# Patient Record
Sex: Female | Born: 1945 | Race: Black or African American | Hispanic: No | Smoking: Former smoker
Health system: Southern US, Community
[De-identification: ages and names within clinical notes are randomized; demographics above are authoritative.]

## PROBLEM LIST (undated history)

## (undated) DIAGNOSIS — Z972 Presence of dental prosthetic device (complete) (partial): Secondary | ICD-10-CM

## (undated) DIAGNOSIS — M8580 Other specified disorders of bone density and structure, unspecified site: Secondary | ICD-10-CM

## (undated) DIAGNOSIS — E669 Obesity, unspecified: Secondary | ICD-10-CM

## (undated) DIAGNOSIS — K2970 Gastritis, unspecified, without bleeding: Secondary | ICD-10-CM

## (undated) DIAGNOSIS — R200 Anesthesia of skin: Secondary | ICD-10-CM

## (undated) DIAGNOSIS — M1710 Unilateral primary osteoarthritis, unspecified knee: Secondary | ICD-10-CM

## (undated) DIAGNOSIS — K5732 Diverticulitis of large intestine without perforation or abscess without bleeding: Secondary | ICD-10-CM

## (undated) DIAGNOSIS — L608 Other nail disorders: Secondary | ICD-10-CM

## (undated) DIAGNOSIS — H409 Unspecified glaucoma: Secondary | ICD-10-CM

## (undated) DIAGNOSIS — K219 Gastro-esophageal reflux disease without esophagitis: Secondary | ICD-10-CM

## (undated) DIAGNOSIS — H269 Unspecified cataract: Secondary | ICD-10-CM

## (undated) DIAGNOSIS — D510 Vitamin B12 deficiency anemia due to intrinsic factor deficiency: Secondary | ICD-10-CM

## (undated) DIAGNOSIS — E785 Hyperlipidemia, unspecified: Secondary | ICD-10-CM

## (undated) DIAGNOSIS — I10 Essential (primary) hypertension: Secondary | ICD-10-CM

## (undated) DIAGNOSIS — E119 Type 2 diabetes mellitus without complications: Secondary | ICD-10-CM

## (undated) DIAGNOSIS — K838 Other specified diseases of biliary tract: Secondary | ICD-10-CM

## (undated) DIAGNOSIS — Z973 Presence of spectacles and contact lenses: Secondary | ICD-10-CM

## (undated) DIAGNOSIS — E040 Nontoxic diffuse goiter: Secondary | ICD-10-CM

## (undated) DIAGNOSIS — E559 Vitamin D deficiency, unspecified: Secondary | ICD-10-CM

## (undated) DIAGNOSIS — Z5189 Encounter for other specified aftercare: Secondary | ICD-10-CM

## (undated) DIAGNOSIS — B9681 Helicobacter pylori [H. pylori] as the cause of diseases classified elsewhere: Secondary | ICD-10-CM

## (undated) DIAGNOSIS — F419 Anxiety disorder, unspecified: Secondary | ICD-10-CM

## (undated) DIAGNOSIS — IMO0002 Reserved for concepts with insufficient information to code with codable children: Secondary | ICD-10-CM

## (undated) HISTORY — DX: Unspecified glaucoma: H40.9

## (undated) HISTORY — DX: Gastro-esophageal reflux disease without esophagitis: K21.9

## (undated) HISTORY — PX: CATARACT EXTRACTION: SUR2

## (undated) HISTORY — DX: Nontoxic diffuse goiter: E04.0

## (undated) HISTORY — DX: Unspecified cataract: H26.9

## (undated) HISTORY — DX: Obesity, unspecified: E66.9

## (undated) HISTORY — DX: Anxiety disorder, unspecified: F41.9

## (undated) HISTORY — DX: Vitamin B12 deficiency anemia due to intrinsic factor deficiency: D51.0

## (undated) HISTORY — DX: Reserved for concepts with insufficient information to code with codable children: IMO0002

## (undated) HISTORY — PX: MULTIPLE TOOTH EXTRACTIONS: SHX2053

## (undated) HISTORY — PX: DILATION AND CURETTAGE OF UTERUS: SHX78

## (undated) HISTORY — DX: Essential (primary) hypertension: I10

## (undated) HISTORY — DX: Diverticulitis of large intestine without perforation or abscess without bleeding: K57.32

## (undated) HISTORY — PX: JOINT REPLACEMENT: SHX530

## (undated) HISTORY — DX: Other specified disorders of bone density and structure, unspecified site: M85.80

## (undated) HISTORY — DX: Helicobacter pylori (H. pylori) as the cause of diseases classified elsewhere: B96.81

## (undated) HISTORY — DX: Other nail disorders: L60.8

## (undated) HISTORY — DX: Vitamin D deficiency, unspecified: E55.9

## (undated) HISTORY — DX: Hyperlipidemia, unspecified: E78.5

## (undated) HISTORY — DX: Unilateral primary osteoarthritis, unspecified knee: M17.10

## (undated) HISTORY — DX: Encounter for other specified aftercare: Z51.89

## (undated) HISTORY — DX: Helicobacter pylori (H. pylori) as the cause of diseases classified elsewhere: K29.70

## (undated) HISTORY — PX: BACK SURGERY: SHX140

## (undated) SURGERY — ENDOSCOPIC RETROGRADE CHOLANGIOPANCREATOGRAPHY (ERCP) WITH PROPOFOL
Anesthesia: Monitor Anesthesia Care

---

## 2007-01-13 HISTORY — PX: COLECTOMY: SHX59

## 2007-09-06 ENCOUNTER — Ambulatory Visit: Payer: Self-pay | Admitting: Infectious Diseases

## 2007-09-06 ENCOUNTER — Inpatient Hospital Stay (HOSPITAL_COMMUNITY): Admission: EM | Admit: 2007-09-06 | Discharge: 2007-09-23 | Payer: Self-pay | Admitting: Emergency Medicine

## 2007-09-10 ENCOUNTER — Ambulatory Visit: Payer: Self-pay | Admitting: Gastroenterology

## 2007-09-12 ENCOUNTER — Encounter (INDEPENDENT_AMBULATORY_CARE_PROVIDER_SITE_OTHER): Payer: Self-pay | Admitting: Surgery

## 2010-05-27 NOTE — Consult Note (Signed)
Alison [name], Alison [name]              ACCOUNT NO.:  [id number]   MEDICAL RECORD NO.:  [id number]          PATIENT TYPE:  INP   LOCATION:  3302                         FACILITY:  [facility]   PHYSICIAN:  Juanetta Gosling, MDDATE OF BIRTH:  [birth year]   DATE OF CONSULTATION:  09/08/2007  DATE OF DISCHARGE:                                 CONSULTATION   TIME OF CONSULTATION:  12:30 p.m.   REQUESTING PHYSICIAN:  Stann Mainland. Sampson Goon, MD.   CONSULTING SURGEON:  Troy Sine. Dwain Sarna, MD.   REASON FOR CONSULTATION:  Lower gastrointestinal bleed.   HISTORY OF PRESENT ILLNESS:  Alison [name] is a 65 year old very pleasant  white female with a history of arthritis, hypertension, and  hyperlipidemia who began having hematochezia this past Tuesday.  At this  time, she says that she was not having any abdominal pain or any other  symptoms that would indicate why she may be having this going on.  At  that time because this scared her, she presented to the emergency  department.  At that time, she was admitted and Dr. Elnoria Howard was consulted  for a colonoscopy and EGD.  This was done and the colonoscopy showed  scattered medium-to-large sized diverticula noted throughout the colon.  There was no active bleeding site noted; however, there was a  significant amount of blood throughout the entire colon.  An EGD was  also done, which was essentially negative.  Since then, the patient has  had several units of blood transfused due to her continuing  hematochezia.  The patient also had a nuclear bleeding scan, which  questioned possible bleed in the ascending colon or near the hepatic  flexure.  Otherwise, the patient was then taken down to Interventional  Radiology today for a CT angio with possible embolization.  However,  when the patient was taken down, no bleeding was seen and therefore an  embolization could not be completed.  Once the patient got back from  this procedure though, she did have approximately  200 mL bloody bowel  movement that was mainly blood.  After that, she then had another  smaller bowel movement that mainly consisted of blood.  At this time,  her hemoglobin is currently being checked, however last check was 6.4;  however, she was transfused after that check.  At this time, we were  consulted for possible surgical intervention for this GI bleed.   REVIEW OF SYSTEMS:  Please see HPI, otherwise all other systems are  negative.  The patient does not complain of any chest pain, shortness of  breath, or any abdominal pain whatsoever.   PAST MEDICAL HISTORY:  Significant for:  1. Arthritis.  2. Hypertension.  3. Hypercholesterolemia.  4. Ibuprofen use.   PAST SURGICAL HISTORY:  None.   SOCIAL HISTORY:  The patient has 1 daughter.  She is currently single  and works at a [employer].  She currently smokes  approximately half a pack of cigarettes a day for the past 40 years;  however, she does not drink any alcohol.   ALLERGIES:  NKDA.   MEDICATIONS:  1.  Zocor 40 mg.  2. Norvasc 10 mg.  3. Ibuprofen 600 mg b.i.d.   PHYSICAL EXAMINATION:  GENERAL:  This is a very pleasant, well-  developed, well-nourished 65 year old black female who is lying in bed,  currently in no acute distress.  VITAL SIGNS:  Temperature 98.3, pulse 90, blood pressure approximately  an hour ago was 105/57, however, approximately 10 minutes ago was  128/53; respirations are 16 with 96% saturation on room air.  HEENT:  Eyes, sclerae noninjected.  Pupils are equal, round, and  reactive to light.  Ears, nose, mouth, and throat; ears and nose with no  obvious masses or lesions.  No rhinorrhea.  Mouth is pink and moist.  Throat shows no exudate.  NECK:  Supple.  Trachea is midline.  No thyromegaly.  HEART:  Regular rate and rhythm.  Normal S1 and S2.  No murmurs,  gallops, or rubs are noted.  +2 carotid, radial, and pedal pulses  bilaterally.  LUNGS:  Clear to auscultation  bilaterally.  No wheezes, rhonchi, or  rales noted.  Respiratory effort is unlabored.  CHEST:  Symmetrical.  ABDOMEN:  Soft, nontender, and nondistended with active bowel sounds.  RECTAL:  Deferred, however, there are bloody remnants from her  hematochezia around her anus.  MUSCULOSKELETAL:  All 4 extremities are symmetrical with no cyanosis,  clubbing, or edema.  SKIN:  Warm and dry with no obvious masses, lesions, or rashes.  NEUROLOGIC:  The patient's cranial nerves II through XII appeared to be  grossly intact.  PSYCH:  The patient is alert and oriented x3 with an appropriate affect.   LABORATORY DATA AND DIAGNOSTICS:  White blood cell count 12,800,  hemoglobin 6.4, hematocrit 18.6, and platelet count was 130,000.  Please  note this is from September 07, 2007.  CBC, H&H has not been done today,  however, this has been ordered, so H&H are currently pending.  Sodium  140, potassium 3.4, chloride 114, CO2 22, glucose 135, BUN 7, and  creatinine 0.85.   Colonoscopy, nuclear medicine scan, and CT angio with embolization; for  all these results, please see HPI as they were described there.   IMPRESSION:  1. Lower gastrointestinal bleed.  2. Acute blood loss anemia secondary to hematochezia, which is      secondary to lower gastrointestinal bleed.  3. Hypertension.  4. Hyperlipidemia.  5. Arthritis.   PLAN:  At this time, the patient has been to Interventional Radiology  where an attempted embolization was tried; however, the patient was not  bleeding at that time and therefore an embolization could not be done.  I have discussed this with Dr. Miles Costain and this is not something where  they typically go down and try to repeat this, as the patient may once  again not be bleeding.  Therefore, at this time, I will need to discuss  this case with Dr. Dwain Sarna.  However, if the patient continues to  bleed, she may need a subtotal colectomy since the bleeding cannot be  localized to a  specific area.  Otherwise, for the time being, I  recommend continuation of checking labs such as H&H, giving blood as  needed, and keeping the patient well hydrated and resuscitated.  I would  also continue to keep the patient  n.p.o. in case surgical intervention is needed urgently.  Otherwise, at  this time, we will continue to follow this patient and hopefully she  will stop bleeding; however, as stated earlier if she does not, the  patient may need surgical intervention at some point.      Letha Cape, PA      Juanetta Gosling, MD  Electronically Signed    KEO/MEDQ  D:  09/08/2007  T:  09/09/2007  Job:  [id number]   cc:   Mick Sell, MD  Jordan Hawks Elnoria Howard, MD

## 2010-05-27 NOTE — Op Note (Signed)
NAMELONDIN, ANTONE              ACCOUNT NO.:  [id number]   MEDICAL RECORD NO.:  [id number]          PATIENT TYPE:  INP   LOCATION:  2550                         FACILITY:  [facility]   PHYSICIAN:  Sandria Bales. Ezzard Standing, M.D.  DATE OF BIRTH:  07/10/1945   DATE OF PROCEDURE:  09/09/2007  DATE OF DISCHARGE:                               OPERATIVE REPORT   Date of Surgery ??   PREOPERATIVE DIAGNOSIS:  Lower gastrointestinal bleed.   POSTOPERATIVE DIAGNOSIS:  Lower gastrointestinal bleed.   PROCEDURES:  Subtotal colectomy with side-to-end ileo to colonic  anastomosis and rigid sigmoidoscopy.   SURGEON:  Sandria Bales. Ezzard Standing, MD.   FIRST ASSISTANT:  Adolph Pollack, M.D.   ESTIMATED BLOOD LOSS:  200 mL   DRAINS LEFT IN:  None.   ANESTHESIA:  General endotracheal and the patient was given 2 units of  blood intraoperatively I believe.   INDICATIONS FOR PROCEDURE:  Ms. Stargell is a 65 year old black female who  was admitted on September 06, 2007, with a lower GI bleed.  She was seen by  Dr. Jeani Hawking who did a colonoscopy on September 06, 2007.  He saw  scattered medium-to-large diverticula throughout the colon.   The patient underwent a tagged red blood cell study, it did show as  positive for bleeding from the right colon/hepatic flexure, but she is  persisting to bleed, now has approximately 15 units of blood ahs been  transfused.  She had a hypotensive episode today.  I discussed at length  with her daughter and the patient about proceeding with surgery for the  GI bleed.   The indication and potential complications of the surgery, which would  include a subtotal colectomy since we did not have a specific source of  bleeding and assumed to be either diverticulum or AVMs.   The risk included infection, leak, needing an ostomy, or continued  bleeding even after surgery.   OPERATIVE NOTE:  The patient placed in a lithotomy position, given a  general endotracheal anesthetic.  She had a  Foley catheter in place, NG  tube in place, given a gram of cefoxitin at the initiation of procedure.  She had prepped her abdomen with Techni-Care and we got into her  abdominal cavity.  A time out was held identifying the patient and the  procedure.   Abdominal exploration revealed the right and left lobe of the liver  unremarkable.  Gallbladder unremarkable.  Stomach unremarkable.  She had  blood throughout most of her colon, but we saw no blood in her small  bowel.  On inspection, we ran a small bowel, there was no tumor mass,  nodule, or lesion and proceeded with the subtotal colectomy.   Initially, I mobilized the colon, started through the sigmoid colon,  went along the left colon, the splenic flexure, got  into the lesser  sac, took down the gastrocolic ligament, tried to spare as much omentum  as I could.  She had some attachments to the gallbladder I took down, I  took down the hepatic flexureand right colon.  After mobilizing the  entire colon  and identifying both the right and left ureters, I then  divided the terminal ileum about 3 or 4 cm beyond the cecum with a 75-  GIA stapler.   I then took down the colon using almost entirely the LigaSure.  I tried  to stay close to the colon, not removing much into the mesentery or  behind into the retroperitoneum and took the right colon, transverse  colon, left colon, did mobilize in the splenic flexure, sigmoid colon;  identified the last tic at about 8-10 cm above the peritoneal  reflection.  I divided the colon at that spot and I did a hand sewn side  of ileostomy to end distal sigmoid/proximal rectum using interrupted 2-0  silk sutures.  The anastomosis was used in an inverting fashion and  used Gambee sutures anteriorly.  This had a thumb size anatomosis  opening at the end.  Dr. Abbey Chatters broke scrub and did a sigmoidoscopy,  scoped up  to about 10 cm, insufflated the rectum, I put the anastomosis  under water, there was no  bubbling or evidence of leak.   He then withdrew the scope.  We then irrigated her with 5 liters of  saline.  We placed the omentum, trying to place the omentum under the  incision.  I then closed the abdomen with 2 running double stranded PDS  sutures.  I put in some interrupted #1 PDS sutures.   I then irrigated the wound, closed the skin with staples.  The patient  tolerated the procedure well.  She was given 2 units of blood  intraoperatively.  We will check labs  on her in the  recovery room.  I  will put her in the ICU at least overnight.      Sandria Bales. Ezzard Standing, M.D.  Electronically Signed     DHN/MEDQ  D:  09/11/2007  T:  09/12/2007  Job:  [id number]   cc:   Mick Sell, MD  Jordan Hawks. Elnoria Howard, MD

## 2010-05-27 NOTE — Consult Note (Signed)
Alison [name], Alison [name]              ACCOUNT NO.:  [id number]   MEDICAL RECORD NO.:  [id number]          PATIENT TYPE:  INP   LOCATION:  3302                         FACILITY:  [facility]   PHYSICIAN:  Jordan Hawks. Elnoria Howard, MD    DATE OF BIRTH:  [birth year]   DATE OF CONSULTATION:  DATE OF DISCHARGE:                                 CONSULTATION   REASON FOR CONSULTATION:  Hematochezia.   HISTORY OF PRESENT ILLNESS:  This is a 65 year old female with a past  medical history of hypertension and arthritis.  She was admitted to the  hospital with complaints with acute hematochezia.  The patient states  that when she woke up this morning she felt a strange sensation and  subsequently had several episodes of hematochezia.  With the bleeding  episode, she presented to the emergency room for further evaluation.  She denies having any abdominal pain, nausea, or vomiting.  She does  report using NSAIDs on a routine basis where she takes 800 mg of  ibuprofen on a p.r.n. basis.  She believe she averages approximately 1  tablet per day.  Because of the significant amount of bleeding, a GI  consultation was requested.   PAST MEDICAL HISTORY:  As stated above.   PAST SURGICAL HISTORY:  As stated above.   FAMILY HISTORY:  Noncontributory.   ALLERGIES:  No known drug allergies.   MEDICATIONS AT HOME:  1. Zocor 40 mg p.o. q.h.s.  2. Norvasc 10 mg p.o. daily.  3. Ibuprofen 800 mg 1 p.o. t.i.d. p.r.n.   SOCIAL HISTORY:  Negative for alcohol or illicit drug use.  Positive for  tobacco half pack per day for the past 20 years.   REVIEW OF SYSTEMS:  As stated above in the history of present illness.  Otherwise, negative.   PHYSICAL EXAMINATION:  VITAL SIGNS:  Blood pressure, systolic ranges  from 74 to low 100s.  Pulse oximetry 98% on 2 liters oxygen and heart  rate is 79 to 86 beats per minute.  GENERAL:  The patient is in no acute distress, alert, and oriented.  HEENT:  Normocephalic and atraumatic.   Extraocular muscles are intact.  NECK:  Supple.  No lymphadenopathy.  LUNGS:  Clear to auscultation bilaterally.  CARDIOVASCULAR:  Regular rate and rhythm.  ABDOMEN:  Obese, soft, nontender, and nondistended.  Positive bowel  sounds.  EXTREMITIES:  No clubbing, cyanosis, or edema.   LABORATORY VALUES:  White blood cell count 12.4, hemoglobin initially at  11.4, subsequently decreased to 8.4, and platelets at 296.  Sodium 140,  potassium 3.4, chloride 108, CO2 23, BUN 16, creatinine 0.9, glucose is  126, and MCV is 86.3.   IMPRESSION:  1. Hematochezia.  2. Anemia secondary to the hematochezia.  3. Nonsteroidal anti-inflammatory drug use.   The patient mostly likely has a diverticular bleed, however, with NSAID  use, she could possibly be having a rapid upper GI bleed.  Further  evaluation endoscopically is required at this time.   PLAN:  1. To perform EGD and colonoscopy.  2. Transfuse at least 2 units of packed red blood cells.  3. Follow hemoglobin.   Further recommendations, pending findings on the EGD and colonoscopy.      Jordan Hawks Elnoria Howard, MD  Electronically Signed     PDH/MEDQ  D:  09/06/2007  T:  09/07/2007  Job:  [id number]

## 2010-05-30 NOTE — Discharge Summary (Signed)
NAMEELLIET, Alison [name]              ACCOUNT NO.:  [id number]   MEDICAL RECORD NO.:  [id number]          PATIENT TYPE:  INP   LOCATION:  5032                         FACILITY:  [facility]   PHYSICIAN:  Fransisco Hertz, M.D.  DATE OF BIRTH:  [birth year]   DATE OF ADMISSION:  09/06/2007  DATE OF DISCHARGE:  09/23/2007                               DISCHARGE SUMMARY   DISCHARGE DIAGNOSES:  1. Acute gastrointestinal bleeding.  2. Anemia secondary to acute gastrointestinal bleeding.  3. Status post subtotal colectomy for gastrointestinal bleeding.  4. Hypertension.   DISCHARGE MEDICATIONS:  1. Norvasc 5 mg 1 tablet daily.  2. Simvastatin 40 mg 1 tablet daily.  3. Avelox 400 mg 1 tablet by mouth for seven more days.  4. Percocet 2.5/325 1 tablet every 6 hours as needed for pain.  5. Promethazine 2.5 mg 1 tablet every 4-6 hours as needed for nausea.   DISPOSITION AND FOLLOWUP:  Alison [name] is discharged in stable condition  with no further GI bleed and no further abdominal pain.  She is to  follow up with Dr. Ezzard Standing of [facility] within 2 weeks of  discharge.  She is discharged with instructions to eat five small meals  per day.  No large meals.  She is to remove the packing in her abdominal  wound when taking a shower.  She has been instructed cleaning of her  abdominal wound.  She is permitted to shower.  She should not lift any  objects over 5 pounds for 6 weeks after admission.  She may walk  upstairs.  She may increase her activity slowly.   PROCEDURES PERFORMED DURING THIS HOSPITALIZATION:  1. Subtotal colectomy with side-to-end ileo to colonic anastomosis and      rigid sigmoidoscopy performed September 09, 2007 by Dr. Ovidio Kin      and Dr. Avel Peace.  2)Abdominal x-ray September 06, 2007, showed bowel gas pattern consistent  with adynamic ileus, calcifications in left pelvis most likely  representing the left ovary.  1. Nuclear medicine tagged red blood cell study on  September 07, 2007      shows no evidence of bleeding during the first 60 minutes, normal      uptake in liver and vascular structures and urinary bladder.  There      is a large amount of activity in the region of the right colon.      This is a positive GI bleeding study, bleeding identified at the      end of the 2 hour imaging.  The source of the bleed appears to be      coming from the ascending colon or hepatic flexure.  2. September 08, 2007 ultrasound-guided angiogram shows patent celiac,      SMA and IMA.  There is no evidence of active arterial bleeding or      acute vascular findings.  3. Chest x-ray September 11, 2007 shows no acute cardiopulmonary process.  4. Chest x-ray September 11, 2007 shows introduction of right internal      jugular catheter with tip at the SVC right antral  junction,      negative for pneumothorax.  5. Abdominal x-ray September 16, 2007 shows no evidence of ileus.      There is post surgical change.  There is mild atelectasis at      bilateral lung bases.  6. Abdominal x-ray September 17, 2007 shows several mildly dilated      right lower quadrant bowel loops which may signify focal ileus.   CONSULTATIONS DURING THIS HOSPITALIZATION:  Dr. Elnoria Howard of GI as well as  Dr. Ezzard Standing with [facility].   ADMITTING HISTORY AND PHYSICAL:  Alison [name] is a 65 year old woman with  past medical history of hypertension, hyperlipidemia, and NSAID use for  the past 2 weeks  presents to the [facility] Emergency Department with a  bright red blood per rectum.  She reports having had two bloody bowel  movements at home with passage of large clots prior to presentation in  the emergency department.  She proceeded to have four bowel movements  consisting of bright red blood and multiple clots.  She relates no  dizziness, no abdominal pain, no nausea, vomiting, diarrhea, no fevers,  no chest pain and no shortness of breath.   PHYSICAL EXAMINATION:  VITAL SIGNS:  Temperature  97.2, blood pressure  121/78, pulse 86, O2 sat 100% on room air.  GENERAL:  She is not in acute distress.  Exam is benign with no  exception.   LABORATORY DATA ON ADMISSION:  Sodium 140, potassium 3.4, chloride 108,  bicarb 23, BUN 16, creatinine 0.9, glucose 126, white blood cells 12.4,  hemoglobin 11.4, platelets 296, MCV is 86.3.   HOSPITAL COURSE BY PROBLEM:  1. Acute GI bleed.  Multiple investigatory procedures performed to      identify the source of this bleeding.  EGD performed by Dr. Elnoria Howard on      September 06, 2007 shows no evidence of laceration or bleeding.      Colonoscopy performed by Dr. Elnoria Howard on September 06, 2007 shows      scattered medium to large sized diverticula throughout the colon.      No active bleeding sites, no masses or polyps, no obvious      inflammation, tagged red blood cell scan identified source of bleed      most likely in the right colon, ascending colon or hepatic flexure.      After transfusion of approximately 15 units of packed red blood      cells it was determined that the best course of action would be      surgical consultation.  On September 09, 2007, Alison [name] received a      subtotal colectomy with anastomosis.  She tolerated this procedure      well, recovered in the hospital and by the time of discharge was      tolerating food with minimal nausea.  She developed a small      possible infection of her surgical site for which she is being      treated with Avelox on discharge.  She had had bowel movements by      the time of discharge and she will follow up with [facility]      [facility]gery.  2. Anemia secondary to acute GI bleed.  During this hospitalization,      Alison [name] was transfused with greater than 15 units of packed red      blood cells.  At the time of her discharge, her hemoglobin had been  stable for greater than 24 hours in the range of 11-10 gm/dL of      hemoglobin.  When she follows up, her hemoglobin should be checked.   3. Hypertension.  Hypertension was controlled in hospital on home      regimen of Norvasc.  4. Hyperlipidemia.  Alison [name] will be continued on her home dose of      simvastatin 40 mg.   On day of discharge, temperature 98.1, pulse 73, blood pressure 124/80  and oxygen saturation 97% on room air.  White blood cell count 13.5,  hemoglobin 10.5, hematocrit 31.7, platelets 780.  Sodium 135, potassium  3.6, chloride  103, bicarb 25, BUN 3, creatinine 0.89, glucose 100.  FOBT performed on  September 22, 2007, 2 out of 3 FOBT test positive, other test being  negative.  This is to be expected after a large GI surgery.  Wound  culture performed September 19, 2007 shows rare Gram-positive rods,  repeat wound culture September 21, 2007 shows no growth.      Elby Showers, MD  Electronically Signed      Fransisco Hertz, M.D.  Electronically Signed    CW/MEDQ  D:  10/02/2007  T:  10/03/2007  Job:  [id number]

## 2010-07-21 ENCOUNTER — Ambulatory Visit
Admission: RE | Admit: 2010-07-21 | Discharge: 2010-07-21 | Disposition: A | Discharge status: Home / Self Care | Payer: Medicare Other | Source: Ambulatory Visit | Attending: Internal Medicine | Admitting: Internal Medicine

## 2010-07-21 ENCOUNTER — Other Ambulatory Visit: Payer: Self-pay | Admitting: Internal Medicine

## 2010-07-21 DIAGNOSIS — M25569 Pain in unspecified knee: Secondary | ICD-10-CM

## 2010-07-21 DIAGNOSIS — M79673 Pain in unspecified foot: Secondary | ICD-10-CM

## 2010-10-15 LAB — CBC
HCT: 27.9 — ABNORMAL LOW
HCT: 27.9 — ABNORMAL LOW
HCT: 28.3 — ABNORMAL LOW
HCT: 28.7 — ABNORMAL LOW
HCT: 28.7 — ABNORMAL LOW
HCT: 29.1 — ABNORMAL LOW
HCT: 29.2 — ABNORMAL LOW
HCT: 29.4 — ABNORMAL LOW
HCT: 29.6 — ABNORMAL LOW
HCT: 30.3 — ABNORMAL LOW
HCT: 30.3 — ABNORMAL LOW
HCT: 30.5 — ABNORMAL LOW
HCT: 30.9 — ABNORMAL LOW
HCT: 31.6 — ABNORMAL LOW
HCT: 32.3 — ABNORMAL LOW
HCT: 32.3 — ABNORMAL LOW
HCT: 32.4 — ABNORMAL LOW
HCT: 32.5 — ABNORMAL LOW
Hemoglobin: 10 — ABNORMAL LOW
Hemoglobin: 10.1 — ABNORMAL LOW
Hemoglobin: 10.1 — ABNORMAL LOW
Hemoglobin: 10.4 — ABNORMAL LOW
Hemoglobin: 10.5 — ABNORMAL LOW
Hemoglobin: 10.5 — ABNORMAL LOW
Hemoglobin: 10.7 — ABNORMAL LOW
Hemoglobin: 10.7 — ABNORMAL LOW
Hemoglobin: 10.9 — ABNORMAL LOW
Hemoglobin: 10.9 — ABNORMAL LOW
Hemoglobin: 9.4 — ABNORMAL LOW
Hemoglobin: 9.5 — ABNORMAL LOW
Hemoglobin: 9.6 — ABNORMAL LOW
Hemoglobin: 9.8 — ABNORMAL LOW
Hemoglobin: 9.8 — ABNORMAL LOW
Hemoglobin: 9.9 — ABNORMAL LOW
Hemoglobin: 9.9 — ABNORMAL LOW
Hemoglobin: 9.9 — ABNORMAL LOW
MCHC: 33.2
MCHC: 33.3
MCHC: 33.4
MCHC: 33.5
MCHC: 33.5
MCHC: 33.7
MCHC: 33.7
MCHC: 33.8
MCHC: 33.8
MCHC: 34
MCHC: 34.2
MCHC: 34.2
MCHC: 34.3
MCHC: 34.4
MCV: 87.7
MCV: 88.4
MCV: 88.5
MCV: 88.5
MCV: 88.6
MCV: 88.8
MCV: 88.8
MCV: 88.9
MCV: 89.1
MCV: 89.2
MCV: 89.4
MCV: 89.4
MCV: 89.5
MCV: 89.5
MCV: 89.7
MCV: 89.8
MCV: 90
Platelets: 253
Platelets: 257
Platelets: 361
Platelets: 396
Platelets: 411 — ABNORMAL HIGH
Platelets: 448 — ABNORMAL HIGH
Platelets: 452 — ABNORMAL HIGH
Platelets: 456 — ABNORMAL HIGH
Platelets: 466 — ABNORMAL HIGH
Platelets: 500 — ABNORMAL HIGH
Platelets: 520 — ABNORMAL HIGH
Platelets: 521 — ABNORMAL HIGH
Platelets: 552 — ABNORMAL HIGH
Platelets: 555 — ABNORMAL HIGH
Platelets: 592 — ABNORMAL HIGH
Platelets: 602 — ABNORMAL HIGH
Platelets: 708 — ABNORMAL HIGH
RBC: 3.14 — ABNORMAL LOW
RBC: 3.25 — ABNORMAL LOW
RBC: 3.26 — ABNORMAL LOW
RBC: 3.28 — ABNORMAL LOW
RBC: 3.29 — ABNORMAL LOW
RBC: 3.33 — ABNORMAL LOW
RBC: 3.35 — ABNORMAL LOW
RBC: 3.38 — ABNORMAL LOW
RBC: 3.44 — ABNORMAL LOW
RBC: 3.45 — ABNORMAL LOW
RBC: 3.59 — ABNORMAL LOW
RBC: 3.62 — ABNORMAL LOW
RBC: 3.66 — ABNORMAL LOW
RDW: 15.1
RDW: 15.4
RDW: 15.4
RDW: 15.5
RDW: 15.5
RDW: 15.5
RDW: 15.5
RDW: 15.5
RDW: 15.6 — ABNORMAL HIGH
RDW: 15.6 — ABNORMAL HIGH
RDW: 15.6 — ABNORMAL HIGH
RDW: 15.6 — ABNORMAL HIGH
RDW: 15.7 — ABNORMAL HIGH
RDW: 15.7 — ABNORMAL HIGH
RDW: 15.8 — ABNORMAL HIGH
RDW: 15.9 — ABNORMAL HIGH
RDW: 15.9 — ABNORMAL HIGH
RDW: 16 — ABNORMAL HIGH
RDW: 16 — ABNORMAL HIGH
RDW: 16.1 — ABNORMAL HIGH
RDW: 16.1 — ABNORMAL HIGH
RDW: 16.2 — ABNORMAL HIGH
WBC: 10.4
WBC: 11 — ABNORMAL HIGH
WBC: 11.2 — ABNORMAL HIGH
WBC: 11.6 — ABNORMAL HIGH
WBC: 16.7 — ABNORMAL HIGH
WBC: 8.9

## 2010-10-15 LAB — COMPREHENSIVE METABOLIC PANEL
ALT: 21
AST: 16
AST: 19
Albumin: 1.6 — ABNORMAL LOW
Albumin: 1.7 — ABNORMAL LOW
BUN: 2 — ABNORMAL LOW
Calcium: 7.7 — ABNORMAL LOW
Chloride: 104
Creatinine, Ser: 0.83
Creatinine, Ser: 0.88
GFR calc Af Amer: >60
GFR calc Af Amer: >60
Sodium: 132 — ABNORMAL LOW
Total Protein: 4.6 — ABNORMAL LOW
Total Protein: 4.8 — ABNORMAL LOW

## 2010-10-15 LAB — GLUCOSE, CAPILLARY
Glucose-Capillary: 104 — ABNORMAL HIGH
Glucose-Capillary: 136 — ABNORMAL HIGH
Glucose-Capillary: 147 — ABNORMAL HIGH
Glucose-Capillary: 98

## 2010-10-15 LAB — DIFFERENTIAL
Basophils Absolute: 0
Basophils Relative: 0
Eosinophils Absolute: 0.1
Eosinophils Absolute: 0.1
Eosinophils Relative: 1
Neutrophils Relative %: 83 — ABNORMAL HIGH

## 2010-10-15 LAB — WOUND CULTURE

## 2010-10-15 LAB — BASIC METABOLIC PANEL
BUN: 3 — ABNORMAL LOW
BUN: 3 — ABNORMAL LOW
BUN: 5 — ABNORMAL LOW
BUN: 5 — ABNORMAL LOW
CO2: 25
CO2: 26
CO2: 27
CO2: 27
Calcium: 7.5 — ABNORMAL LOW
Chloride: 100
Chloride: 102
Chloride: 103
Chloride: 98
Creatinine, Ser: 0.68
Creatinine, Ser: 0.78
GFR calc non Af Amer: >60
GFR calc non Af Amer: >60
GFR calc non Af Amer: >60
GFR calc non Af Amer: >60
Glucose, Bld: 100 — ABNORMAL HIGH
Glucose, Bld: 108 — ABNORMAL HIGH
Glucose, Bld: 122 — ABNORMAL HIGH
Glucose, Bld: 132 — ABNORMAL HIGH
Glucose, Bld: 140 — ABNORMAL HIGH
Glucose, Bld: 93
Potassium: 3.4 — ABNORMAL LOW
Potassium: 3.6
Potassium: 3.6
Potassium: 3.7
Potassium: 4
Sodium: 131 — ABNORMAL LOW
Sodium: 131 — ABNORMAL LOW
Sodium: 132 — ABNORMAL LOW
Sodium: 135

## 2010-10-15 LAB — OCCULT BLOOD X 1 CARD TO LAB, STOOL: Fecal Occult Bld: POSITIVE

## 2011-01-19 DIAGNOSIS — M1710 Unilateral primary osteoarthritis, unspecified knee: Secondary | ICD-10-CM | POA: Diagnosis not present

## 2011-01-19 DIAGNOSIS — E785 Hyperlipidemia, unspecified: Secondary | ICD-10-CM | POA: Diagnosis not present

## 2011-01-19 DIAGNOSIS — Z23 Encounter for immunization: Secondary | ICD-10-CM | POA: Diagnosis not present

## 2011-01-19 DIAGNOSIS — I10 Essential (primary) hypertension: Secondary | ICD-10-CM | POA: Diagnosis not present

## 2011-01-19 DIAGNOSIS — Z0000 Encounter for general adult medical examination without abnormal findings: Secondary | ICD-10-CM | POA: Diagnosis not present

## 2011-01-19 DIAGNOSIS — M899 Disorder of bone, unspecified: Secondary | ICD-10-CM | POA: Diagnosis not present

## 2011-01-26 DIAGNOSIS — I10 Essential (primary) hypertension: Secondary | ICD-10-CM | POA: Diagnosis not present

## 2011-01-26 DIAGNOSIS — E785 Hyperlipidemia, unspecified: Secondary | ICD-10-CM | POA: Diagnosis not present

## 2011-01-26 DIAGNOSIS — M949 Disorder of cartilage, unspecified: Secondary | ICD-10-CM | POA: Diagnosis not present

## 2011-01-26 DIAGNOSIS — M899 Disorder of bone, unspecified: Secondary | ICD-10-CM | POA: Diagnosis not present

## 2011-01-26 DIAGNOSIS — M1710 Unilateral primary osteoarthritis, unspecified knee: Secondary | ICD-10-CM | POA: Diagnosis not present

## 2011-01-26 DIAGNOSIS — IMO0002 Reserved for concepts with insufficient information to code with codable children: Secondary | ICD-10-CM | POA: Diagnosis not present

## 2011-02-04 ENCOUNTER — Other Ambulatory Visit: Payer: Self-pay | Admitting: Internal Medicine

## 2011-02-04 ENCOUNTER — Ambulatory Visit
Admission: RE | Admit: 2011-02-04 | Discharge: 2011-02-04 | Disposition: A | Discharge status: Home / Self Care | Payer: Medicare Other | Source: Ambulatory Visit | Attending: Internal Medicine | Admitting: Internal Medicine

## 2011-02-04 DIAGNOSIS — R52 Pain, unspecified: Secondary | ICD-10-CM

## 2011-02-04 DIAGNOSIS — M25569 Pain in unspecified knee: Secondary | ICD-10-CM | POA: Diagnosis not present

## 2011-02-04 DIAGNOSIS — M1710 Unilateral primary osteoarthritis, unspecified knee: Secondary | ICD-10-CM | POA: Diagnosis not present

## 2011-02-04 DIAGNOSIS — M25869 Other specified joint disorders, unspecified knee: Secondary | ICD-10-CM | POA: Diagnosis not present

## 2011-02-18 DIAGNOSIS — Z1231 Encounter for screening mammogram for malignant neoplasm of breast: Secondary | ICD-10-CM | POA: Diagnosis not present

## 2011-03-17 DIAGNOSIS — H4011X0 Primary open-angle glaucoma, stage unspecified: Secondary | ICD-10-CM | POA: Diagnosis not present

## 2011-03-26 DIAGNOSIS — H4011X0 Primary open-angle glaucoma, stage unspecified: Secondary | ICD-10-CM | POA: Diagnosis not present

## 2011-04-20 DIAGNOSIS — I10 Essential (primary) hypertension: Secondary | ICD-10-CM | POA: Diagnosis not present

## 2011-04-20 DIAGNOSIS — D510 Vitamin B12 deficiency anemia due to intrinsic factor deficiency: Secondary | ICD-10-CM | POA: Diagnosis not present

## 2011-04-30 ENCOUNTER — Other Ambulatory Visit: Payer: Self-pay | Admitting: Internal Medicine

## 2011-04-30 DIAGNOSIS — IMO0002 Reserved for concepts with insufficient information to code with codable children: Secondary | ICD-10-CM | POA: Diagnosis not present

## 2011-04-30 DIAGNOSIS — I10 Essential (primary) hypertension: Secondary | ICD-10-CM | POA: Diagnosis not present

## 2011-04-30 DIAGNOSIS — E040 Nontoxic diffuse goiter: Secondary | ICD-10-CM | POA: Diagnosis not present

## 2011-04-30 DIAGNOSIS — D510 Vitamin B12 deficiency anemia due to intrinsic factor deficiency: Secondary | ICD-10-CM | POA: Diagnosis not present

## 2011-04-30 DIAGNOSIS — E049 Nontoxic goiter, unspecified: Secondary | ICD-10-CM

## 2011-04-30 DIAGNOSIS — E785 Hyperlipidemia, unspecified: Secondary | ICD-10-CM | POA: Diagnosis not present

## 2011-04-30 DIAGNOSIS — M1710 Unilateral primary osteoarthritis, unspecified knee: Secondary | ICD-10-CM | POA: Diagnosis not present

## 2011-05-01 ENCOUNTER — Ambulatory Visit
Admission: RE | Admit: 2011-05-01 | Discharge: 2011-05-01 | Disposition: A | Discharge status: Home / Self Care | Payer: Medicare Other | Source: Ambulatory Visit | Attending: Internal Medicine | Admitting: Internal Medicine

## 2011-05-01 DIAGNOSIS — E042 Nontoxic multinodular goiter: Secondary | ICD-10-CM | POA: Diagnosis not present

## 2011-05-01 DIAGNOSIS — E049 Nontoxic goiter, unspecified: Secondary | ICD-10-CM

## 2011-08-06 DIAGNOSIS — E785 Hyperlipidemia, unspecified: Secondary | ICD-10-CM | POA: Diagnosis not present

## 2011-08-06 DIAGNOSIS — I10 Essential (primary) hypertension: Secondary | ICD-10-CM | POA: Diagnosis not present

## 2011-08-06 DIAGNOSIS — M1710 Unilateral primary osteoarthritis, unspecified knee: Secondary | ICD-10-CM | POA: Diagnosis not present

## 2011-08-06 DIAGNOSIS — E669 Obesity, unspecified: Secondary | ICD-10-CM | POA: Diagnosis not present

## 2011-08-06 DIAGNOSIS — IMO0002 Reserved for concepts with insufficient information to code with codable children: Secondary | ICD-10-CM | POA: Diagnosis not present

## 2011-09-24 DIAGNOSIS — IMO0002 Reserved for concepts with insufficient information to code with codable children: Secondary | ICD-10-CM | POA: Diagnosis not present

## 2011-09-24 DIAGNOSIS — I10 Essential (primary) hypertension: Secondary | ICD-10-CM | POA: Diagnosis not present

## 2011-09-24 DIAGNOSIS — E669 Obesity, unspecified: Secondary | ICD-10-CM | POA: Diagnosis not present

## 2011-09-24 DIAGNOSIS — F17200 Nicotine dependence, unspecified, uncomplicated: Secondary | ICD-10-CM | POA: Diagnosis not present

## 2011-09-24 DIAGNOSIS — M1710 Unilateral primary osteoarthritis, unspecified knee: Secondary | ICD-10-CM | POA: Diagnosis not present

## 2011-09-25 DIAGNOSIS — H26019 Infantile and juvenile cortical, lamellar, or zonular cataract, unspecified eye: Secondary | ICD-10-CM | POA: Diagnosis not present

## 2011-09-25 DIAGNOSIS — H4011X0 Primary open-angle glaucoma, stage unspecified: Secondary | ICD-10-CM | POA: Diagnosis not present

## 2011-09-25 DIAGNOSIS — H11159 Pinguecula, unspecified eye: Secondary | ICD-10-CM | POA: Diagnosis not present

## 2011-09-25 DIAGNOSIS — H18419 Arcus senilis, unspecified eye: Secondary | ICD-10-CM | POA: Diagnosis not present

## 2011-10-27 DIAGNOSIS — M1710 Unilateral primary osteoarthritis, unspecified knee: Secondary | ICD-10-CM | POA: Diagnosis not present

## 2011-11-30 DIAGNOSIS — M1710 Unilateral primary osteoarthritis, unspecified knee: Secondary | ICD-10-CM | POA: Diagnosis not present

## 2011-12-01 DIAGNOSIS — D510 Vitamin B12 deficiency anemia due to intrinsic factor deficiency: Secondary | ICD-10-CM | POA: Diagnosis not present

## 2011-12-01 DIAGNOSIS — E785 Hyperlipidemia, unspecified: Secondary | ICD-10-CM | POA: Diagnosis not present

## 2011-12-01 DIAGNOSIS — F17200 Nicotine dependence, unspecified, uncomplicated: Secondary | ICD-10-CM | POA: Diagnosis not present

## 2011-12-01 DIAGNOSIS — E559 Vitamin D deficiency, unspecified: Secondary | ICD-10-CM | POA: Diagnosis not present

## 2011-12-01 DIAGNOSIS — I10 Essential (primary) hypertension: Secondary | ICD-10-CM | POA: Diagnosis not present

## 2011-12-03 DIAGNOSIS — IMO0002 Reserved for concepts with insufficient information to code with codable children: Secondary | ICD-10-CM | POA: Diagnosis not present

## 2011-12-03 DIAGNOSIS — H409 Unspecified glaucoma: Secondary | ICD-10-CM | POA: Diagnosis not present

## 2011-12-03 DIAGNOSIS — M899 Disorder of bone, unspecified: Secondary | ICD-10-CM | POA: Diagnosis not present

## 2011-12-03 DIAGNOSIS — M1710 Unilateral primary osteoarthritis, unspecified knee: Secondary | ICD-10-CM | POA: Diagnosis not present

## 2011-12-03 DIAGNOSIS — Z23 Encounter for immunization: Secondary | ICD-10-CM | POA: Diagnosis not present

## 2011-12-07 DIAGNOSIS — M1710 Unilateral primary osteoarthritis, unspecified knee: Secondary | ICD-10-CM | POA: Diagnosis not present

## 2011-12-15 DIAGNOSIS — M1710 Unilateral primary osteoarthritis, unspecified knee: Secondary | ICD-10-CM | POA: Diagnosis not present

## 2012-02-22 DIAGNOSIS — Z803 Family history of malignant neoplasm of breast: Secondary | ICD-10-CM | POA: Diagnosis not present

## 2012-02-22 DIAGNOSIS — Z13820 Encounter for screening for osteoporosis: Secondary | ICD-10-CM | POA: Diagnosis not present

## 2012-02-22 DIAGNOSIS — Z1231 Encounter for screening mammogram for malignant neoplasm of breast: Secondary | ICD-10-CM | POA: Diagnosis not present

## 2012-02-22 LAB — HM DEXA SCAN: HM Dexa Scan: NORMAL

## 2012-03-01 DIAGNOSIS — H4011X0 Primary open-angle glaucoma, stage unspecified: Secondary | ICD-10-CM | POA: Diagnosis not present

## 2012-04-01 ENCOUNTER — Other Ambulatory Visit: Payer: Self-pay | Admitting: Internal Medicine

## 2012-04-02 ENCOUNTER — Other Ambulatory Visit: Payer: Self-pay | Admitting: Internal Medicine

## 2012-04-04 ENCOUNTER — Other Ambulatory Visit: Payer: Self-pay | Admitting: Geriatric Medicine

## 2012-04-04 MED ORDER — CELECOXIB 200 MG PO CAPS: ORAL_CAPSULE | ORAL | Status: DC

## 2012-05-02 ENCOUNTER — Other Ambulatory Visit: Payer: Self-pay | Admitting: *Deleted

## 2012-05-02 MED ORDER — PRAVASTATIN SODIUM 80 MG PO TABS: ORAL_TABLET | ORAL | Status: DC

## 2012-05-31 ENCOUNTER — Encounter: Payer: Self-pay | Admitting: *Deleted

## 2012-06-02 ENCOUNTER — Ambulatory Visit (INDEPENDENT_AMBULATORY_CARE_PROVIDER_SITE_OTHER): Payer: Medicare Other | Admitting: Internal Medicine

## 2012-06-02 ENCOUNTER — Other Ambulatory Visit: Payer: Self-pay | Admitting: *Deleted

## 2012-06-02 ENCOUNTER — Other Ambulatory Visit: Payer: Self-pay | Admitting: Geriatric Medicine

## 2012-06-02 ENCOUNTER — Encounter: Payer: Self-pay | Admitting: Internal Medicine

## 2012-06-02 VITALS — BP 142/88 | HR 85 | Temp 97.6°F | Resp 14 | Ht 67.0 in | Wt 218.0 lb

## 2012-06-02 DIAGNOSIS — D510 Vitamin B12 deficiency anemia due to intrinsic factor deficiency: Secondary | ICD-10-CM

## 2012-06-02 DIAGNOSIS — I10 Essential (primary) hypertension: Secondary | ICD-10-CM | POA: Insufficient documentation

## 2012-06-02 DIAGNOSIS — E669 Obesity, unspecified: Secondary | ICD-10-CM

## 2012-06-02 DIAGNOSIS — M1710 Unilateral primary osteoarthritis, unspecified knee: Secondary | ICD-10-CM

## 2012-06-02 DIAGNOSIS — E559 Vitamin D deficiency, unspecified: Secondary | ICD-10-CM | POA: Insufficient documentation

## 2012-06-02 DIAGNOSIS — M8580 Other specified disorders of bone density and structure, unspecified site: Secondary | ICD-10-CM | POA: Insufficient documentation

## 2012-06-02 DIAGNOSIS — E785 Hyperlipidemia, unspecified: Secondary | ICD-10-CM

## 2012-06-02 DIAGNOSIS — H409 Unspecified glaucoma: Secondary | ICD-10-CM | POA: Insufficient documentation

## 2012-06-02 DIAGNOSIS — IMO0002 Reserved for concepts with insufficient information to code with codable children: Secondary | ICD-10-CM

## 2012-06-02 DIAGNOSIS — F17200 Nicotine dependence, unspecified, uncomplicated: Secondary | ICD-10-CM | POA: Diagnosis not present

## 2012-06-02 DIAGNOSIS — M899 Disorder of bone, unspecified: Secondary | ICD-10-CM

## 2012-06-02 DIAGNOSIS — M949 Disorder of cartilage, unspecified: Secondary | ICD-10-CM

## 2012-06-02 MED ORDER — PRAVASTATIN SODIUM 80 MG PO TABS: ORAL_TABLET | ORAL | Status: DC

## 2012-06-02 MED ORDER — CELECOXIB 200 MG PO CAPS: ORAL_CAPSULE | ORAL | Status: DC

## 2012-06-02 MED ORDER — AMLODIPINE BESYLATE 10 MG PO TABS: ORAL_TABLET | ORAL | Status: DC

## 2012-06-02 NOTE — Assessment & Plan Note (Signed)
Follows with ophtho.  Wears glasses.  Has been stable.

## 2012-06-02 NOTE — Assessment & Plan Note (Signed)
BP is elevated today, but she has not taken her amlodipine (thought she couldn't if she was getting labs--educated that it was ok to take before labs).

## 2012-06-02 NOTE — Assessment & Plan Note (Signed)
Continues to smoke.  Risks reviewed again today and benefits of quitting.  Discussed patches, but she does not want to buy them if she is not going to be able to quit.  Emphasized that this is crucial to her health and the most important step she can take for herself.

## 2012-06-02 NOTE — Assessment & Plan Note (Signed)
Primarily of knees.  Is planning to return to orthopedics for more "gel" injections due to persistent pain.

## 2012-06-02 NOTE — Assessment & Plan Note (Signed)
Recheck vitamin D level today.  Also has osteopenia due to smoking and age/postmenopausal.

## 2012-06-02 NOTE — Assessment & Plan Note (Signed)
F/u CBC today ?

## 2012-06-02 NOTE — Progress Notes (Signed)
Patient ID: Alison [name], female   DOB: 09/30/1945, 67 y.o.   MRN: [id number] Code Status: full   No Known Allergies  Chief Complaint  Patient presents with  . Medical Managment of Chronic Issues    HPI: Patient is a 67 y.o. AA female seen in the office today for her routine visit for medical mgt of chronic diseases.  Didn't do her bloodwork.  Had some GI upset that day so missed appt.  BP up a little this am, but did not take her norvasc this am.    Thinks she may need to go back to orthopedics b/c knees are acting up again.  Had the gel put in which last for a little bit on right knee.  Will call to f/u with them.    Is taking her calcium with her vitamin D daily.    Not doing very well on exercise.  Did lose 5 lbs since last appt.  Is watching her sweets.    Review of Systems:  Review of Systems  Constitutional: Positive for weight loss. Negative for fever, chills and malaise/fatigue.  Eyes: Negative for blurred vision.  Respiratory: Negative for shortness of breath.        Continues to smoke  Cardiovascular: Negative for chest pain.  Gastrointestinal: Negative for nausea and vomiting.       Has loose stools depending on what she eats--attributes to her diverticular disease  Genitourinary: Negative for dysuria.  Musculoskeletal: Positive for joint pain.       Knee arthralgias  Skin: Negative for rash.  Neurological: Negative for dizziness, loss of consciousness, weakness and headaches.  Endo/Heme/Allergies: Positive for polydipsia.  Psychiatric/Behavioral: Negative for depression and memory loss. The patient is not nervous/anxious and does not have insomnia.      Past Medical History  Diagnosis Date  . Unspecified vitamin D deficiency   . Obesity, unspecified   . Tobacco use disorder   . Goiter, specified as simple   . Dizziness and giddiness   . Pain in joint, ankle and foot   . Pernicious anemia   . Hyperlipidemia LDL goal < 100   . Unspecified glaucoma(365.9)    . Benign essential hypertension   . Diverticulitis of colon (without mention of hemorrhage)   . Other specified disease of nail   . Osteoarthrosis, unspecified whether generalized or localized, lower leg   . Osteopenia    Past Surgical History  Procedure Laterality Date  . Colectomy  2009   Social History:   reports that she has been smoking.  She does not have any smokeless tobacco history on file. She reports that she does not drink alcohol or use illicit drugs.  Family History  Problem Relation Age of Onset  . Diabetes Sister     Medications: Patient's Medications  New Prescriptions   No medications on file  Previous Medications   ACETAMINOPHEN (TYLENOL ARTHRITIS PAIN PO)    Take one to two tablets once daily as needed to help pain   LATANOPROST (XALATAN) 0.005 % OPHTHALMIC SOLUTION    One drop in each eye at night  Modified Medications   Modified Medication Previous Medication   AMLODIPINE (NORVASC) 10 MG TABLET amLODipine (NORVASC) 10 MG tablet      Take one tablet once daily for blood pressure and to protect the heart    Take one tablet once daily for blood pressure and to protect the heart   CELECOXIB (CELEBREX) 200 MG CAPSULE celecoxib (CELEBREX) 200 MG capsule  Take one capsule by mouth daily for knee pain.    Take one capsule by mouth daily for knee pain.   PRAVASTATIN (PRAVACHOL) 80 MG TABLET pravastatin (PRAVACHOL) 80 MG tablet      Take one tablet once a day for cholesterol    Take one tablet once a day for cholesterol  Discontinued Medications   No medications on file     Physical Exam: Filed Vitals:   06/02/12 0816  BP: 142/88  Pulse: 85  Temp: 97.6 F (36.4 C)  TempSrc: Oral  Resp: 14  Height: 5\' 7"  (1.702 m)  Weight: 218 lb (98.884 kg)  Physical Exam  Constitutional: She is oriented to person, place, and time.  Obese AA female  HENT:  Head: Normocephalic and atraumatic.  Cardiovascular: Normal rate, regular rhythm, normal heart sounds and  intact distal pulses.   Pulmonary/Chest: Effort normal and breath sounds normal.  Few rhonchi  Abdominal: Soft. Bowel sounds are normal. She exhibits no distension and no mass. There is no tenderness.  Musculoskeletal:  Knees tender, crepitus present with ROM  Neurological: She is alert and oriented to person, place, and time.  Skin: Skin is warm and dry.  Fingers with severe onychomycosis  Psychiatric: She has a normal mood and affect. Her behavior is normal. Judgment and thought content normal.   Labs reviewed: 12/01/2011 CBC Wbc 9.2 Rbc 4.51 Hemoglobin 13.1  CMP Glucose 104 Bun 14 Creatinine 0.80  Lipid Panel Cholesterol 147 Triglycerides 125 Hdl 32 Ldl 90  Vitamin D 25 Hydroxy 17.6   Past Procedures: 09/2007-Colonoscopy- Dr. Elnoria Howard 01/2009-Pap Smear- in [place] 01/2009-Mammogram- in [place] 02/2011 Mammogram  Assessment/Plan Unspecified vitamin D deficiency Recheck vitamin D level today.  Also has osteopenia due to smoking and age/postmenopausal.  Obesity, unspecified Did lose 5 lbs since her last visit due to dietary changes.    Tobacco use disorder Continues to smoke.  Risks reviewed again today and benefits of quitting.  Discussed patches, but she does not want to buy them if she is not going to be able to quit.  Emphasized that this is crucial to her health and the most important step she can take for herself.    Pernicious anemia F/u CBC today.    Unspecified glaucoma(365.9) Follows with ophtho.  Wears glasses.  Has been stable.  Benign essential hypertension BP is elevated today, but she has not taken her amlodipine (thought she couldn't if she was getting labs--educated that it was ok to take before labs).    Osteoarthrosis, unspecified whether generalized or localized, lower leg Primarily of knees.  Is planning to return to orthopedics for more "gel" injections due to persistent pain.  Osteopenia Per bone density.  Emphasized that her smoking is contributing  greatly to this.  Is taking calcium with vitamin D now as ordered.  Encouraged weightbearing exercise, but knee OA interferes.  Is trying to lose weight with diet.     Labs/tests ordered:  Cbc, cmp, flp, hba1c, vit D

## 2012-06-02 NOTE — Patient Instructions (Signed)
Diets for Diabetes, Food Labeling Look at food labels to help you decide how much of a product you can eat. You will want to check the amount of total carbohydrate in a serving to see how the food fits into your meal plan. In the list of ingredients, the ingredient present in the largest amount by weight must be listed first, followed by the other ingredients in descending order. STANDARD OF IDENTITY Most products have a list of ingredients. However, foods that the Food and Drug Administration (FDA) has given a standard of identity do not need a list of ingredients. A standard of identity means that a food must contain certain ingredients if it is called a particular name. Examples are mayonnaise, peanut butter, ketchup, jelly, and cheese. LABELING TERMS There are many terms found on food labels. Some of these terms have specific definitions. Some terms are regulated by the FDA, and the FDA has clearly specified how they can be used. Others are not regulated or well-defined and can be misleading and confusing. SPECIFICALLY DEFINED TERMS Nutritive Sweetener.  A sweetener that contains calories,such as table sugar or honey. Nonnutritive Sweetener.  A sweetener with few or no calories,such as saccharin, aspartame, sucralose, and cyclamate. LABELING TERMS REGULATED BY THE FDA Free.  The product contains only a tiny or small amount of fat, cholesterol, sodium, sugar, or calories. For example, a "fat-free" product will contain less than 0.5 g of fat per serving. Low.  A food described as "low" in fat, saturated fat, cholesterol, sodium, or calories could be eaten fairly often without exceeding dietary guidelines. For example, "low in fat" means no more than 3 g of fat per serving. Lean.  "Lean" and "extra lean" are U.S. Department of Agriculture (USDA) terms for use on meat and poultry products. "Lean" means the product contains less than 10 g of fat, 4 g of saturated fat, and 95 mg of cholesterol  per serving. "Lean" is not as low in fat as a product labeled "low." Extra Lean.  "Extra lean" means the product contains less than 5 g of fat, 2 g of saturated fat, and 95 mg of cholesterol per serving. While "extra lean" has less fat than "lean," it is still higher in fat than a product labeled "low." Reduced, Less, Fewer.  A diet product that contains 25% less of a nutrient or calories than the regular version. For example, hot dogs might be labeled "25% less fat than our regular hot dogs." Light/Lite.  A diet product that contains  fewer calories or  the fat of the original. For example, "light in sodium" means a product with  the usual sodium. More.  One serving contains at least 10% more of the daily value of a vitamin, mineral, or fiber than usual. Good Source Of.  One serving contains 10% to 19% of the daily value for a particular vitamin, mineral, or fiber. Excellent Source Of.  One serving contains 20% or more of the daily value for a particular nutrient. Other terms used might be "high in" or "rich in." Enriched or Fortified.  The product contains added vitamins, minerals, or protein. Nutrition labeling must be used on enriched or fortified foods. Imitation.  The product has been altered so that it is lower in protein, vitamins, or minerals than the usual food,such as imitation peanut butter. Total Fat.  The number listed is the total of all fat found in a serving of the product. Under total fat, food labels must list saturated fat and   trans fat, which are associated with raising bad cholesterol and an increased risk of heart blood vessel disease. Saturated Fat.  Mainly fats from animal-based sources. Some examples are red meat, cheese, cream, whole milk, and coconut oil. Trans Fat.  Found in some fried snack foods, packaged foods, and fried restaurant foods. It is recommended you eat as close to 0 g of trans fat as possible, since it raises bad cholesterol and lowers  good cholesterol. Polyunsaturated and Monounsaturated Fats.  More healthful fats. These fats are from plant sources. Total Carbohydrate.  The number of carbohydrate grams in a serving of the product. Under total carbohydrate are listed the other carbohydrate sources, such as dietary fiber and sugars. Dietary Fiber.  A carbohydrate from plant sources. Sugars.  Sugars listed on the label contain all naturally occurring sugars as well as added sugars. LABELING TERMS NOT REGULATED BY THE FDA Sugarless.  Table sugar (sucrose) has not been added. However, the manufacturer may use another form of sugar in place of sucrose to sweeten the product. For example, sugar alcohols are used to sweeten foods. Sugar alcohols are a form of sugar but are not table sugar. If a product contains sugar alcohols in place of sucrose, it can still be labeled "sugarless." Low Salt, Salt-Free, Unsalted, No Salt, No Salt Added, Without Added Salt.  Food that is usually processed with salt has been made without salt. However, the food may contain sodium-containing additives, such as preservatives, leavening agents, or flavorings. Natural.  This term has no legal meaning. Organic.  Foods that are certified as organic have been inspected and approved by the USDA to ensure they are produced without pesticides, fertilizers containing synthetic ingredients, bioengineering, or ionizing radiation. Document Released: 01/01/2003 Document Revised: 03/23/2011 Document Reviewed: 07/19/2008 Exit[employer] Patient Information 2014 [place], [place]. 1800 Calorie Diet for Diabetes Meal Planning The 1800 calorie diet is designed for eating up to 1800 calories each day. Following this diet and making healthy meal choices can help improve overall health. This diet controls blood sugar (glucose) levels and can also help lower blood pressure and cholesterol. SERVING SIZES Measuring foods and serving sizes helps to make sure you are getting the  right amount of food. The list below tells how big or small some common serving sizes are:  1 oz.........4 stacked dice.  3 oz........Marland KitchenDeck of cards.  1 tsp.......Marland KitchenTip of little finger.  1 tbs......Marland KitchenMarland KitchenThumb.  2 tbs.......Marland KitchenGolf ball.   cup......Marland KitchenHalf of a fist.  1 cup.......Marland KitchenA fist. GUIDELINES FOR CHOOSING FOODS The goal of this diet is to eat a variety of foods and limit calories to 1800 each day. This can be done by choosing foods that are low in calories and fat. The diet also suggests eating small amounts of food frequently. Doing this helps control your blood glucose levels so they do not get too high or too low. Each meal or snack may include a protein food source to help you feel more satisfied and to stabilize your blood glucose. Try to eat about the same amount of food around the same time each day. This includes weekend days, travel days, and days off work. Space your meals about 4 to 5 hours apart and add a snack between them if you wish.  For example, a daily food plan could include breakfast, a morning snack, lunch, dinner, and an evening snack. Healthy meals and snacks include whole grains, vegetables, fruits, lean meats, poultry, fish, and dairy products. As you plan your meals, select a variety of foods.  Choose from the bread and starch, vegetable, fruit, dairy, and meat/protein groups. Examples of foods from each group and their suggested serving sizes are listed below. Use measuring cups and spoons to become familiar with what a healthy portion looks like. Bread and Starch Each serving equals 15 grams of carbohydrates.  1 slice bread.   bagel.   cup cold cereal (unsweetened).   cup hot cereal or mashed potatoes.  1 small potato (size of a computer mouse).   cup cooked pasta or rice.   English muffin.  1 cup broth-based soup.  3 cups of popcorn.  4 to 6 whole-wheat crackers.   cup cooked beans, peas, or corn. Vegetable Each serving equals 5 grams of  carbohydrates.   cup cooked vegetables.  1 cup raw vegetables.   cup tomato or vegetable juice. Fruit Each serving equals 15 grams of carbohydrates.  1 small apple or orange.  1 cup watermelon or strawberries.   cup applesauce (no sugar added).  2 tbs raisins.   banana.   cup canned fruit, packed in water, its own juice, or sweetened with a sugar substitute.   cup unsweetened fruit juice. Dairy Each serving equals 12 to 15 grams of carbohydrates.  1 cup fat-free milk.  6 oz artificially sweetened yogurt or plain yogurt.  1 cup low-fat buttermilk.  1 cup soy milk.  1 cup almond milk. Meat/Protein  1 large egg.  2 to 3 oz meat, poultry, or fish.   cup low-fat cottage cheese.  1 tbs peanut butter.  1 oz low-fat cheese.   cup tuna in water.   cup tofu. Fat  1 tsp oil.  1 tsp trans-fat-free margarine.  1 tsp butter.  1 tsp mayonnaise.  2 tbs avocado.  1 tbs salad dressing.  1 tbs cream cheese.  2 tbs sour cream. SAMPLE 1800 CALORIE DIET PLAN Breakfast   cup unsweetened cereal (1 carb serving).  1 cup fat-free milk (1 carb serving).  1 slice whole-wheat toast (1 carb serving).   small banana (1 carb serving).  1 scrambled egg.  1 tsp trans-fat-free margarine. Lunch  Tuna sandwich.  2 slices whole-wheat bread (2 carb servings).   cup canned tuna in water, drained.  1 tbs reduced fat mayonnaise.  1 stalk celery, chopped.  2 slices tomato.  1 lettuce leaf.  1 cup carrot sticks.  24 to 30 seedless grapes (2 carb servings).  6 oz light yogurt (1 carb serving). Afternoon Snack  3 graham cracker squares (1 carb serving).  Fat-free milk, 1 cup (1 carb serving).  1 tbs peanut butter. Dinner  3 oz salmon, broiled with 1 tsp oil.  1 cup mashed potatoes (2 carb servings) with 1 tsp trans-fat-free margarine.  1 cup fresh or frozen green beans.  1 cup steamed asparagus.  1 cup fat-free milk (1 carb  serving). Evening Snack  3 cups air-popped popcorn (1 carb serving).  2 tbs parmesan cheese sprinkled on top. MEAL PLAN Use this worksheet to help you make a daily meal plan based on the 1800 calorie diet suggestions. If you are using this plan to help you control your blood glucose, you may interchange carbohydrate-containing foods (dairy, starches, and fruits). Select a variety of fresh foods of varying colors and flavors. The total amount of carbohydrate in your meals or snacks is more important than making sure you include all of the food groups every time you eat. Choose from the following foods to build your day's meals:  8  Starches.  4 Vegetables.  3 Fruits.  2 Dairy.  6 to 7 oz Meat/Protein.  Up to 4 Fats. Your dietician can use this worksheet to help you decide how many servings and which types of foods are right for you. BREAKFAST Food Group and Servings / Food Choice Starch ________________________________________________________ Dairy _________________________________________________________ Fruit _________________________________________________________ Meat/Protein __________________________________________________ Fat ___________________________________________________________ LUNCH Food Group and Servings / Food Choice Starch ________________________________________________________ Meat/Protein __________________________________________________ Vegetable _____________________________________________________ Fruit _________________________________________________________ Dairy _________________________________________________________ Fat ___________________________________________________________ Alison [name] Food Group and Servings / Food Choice Starch ________________________________________________________ Meat/Protein __________________________________________________ Fruit __________________________________________________________ Dairy  _________________________________________________________ Alison [name] Food Group and Servings / Food Choice Starch _________________________________________________________ Meat/Protein ___________________________________________________ Dairy __________________________________________________________ Vegetable ______________________________________________________ Fruit ___________________________________________________________ Fat ____________________________________________________________ Alison [name] Food Group and Servings / Food Choice Fruit __________________________________________________________ Meat/Protein ___________________________________________________ Dairy __________________________________________________________ Starch _________________________________________________________ DAILY TOTALS Starch ____________________________ Vegetable _________________________ Fruit _____________________________ Dairy _____________________________ Meat/Protein______________________ Fat _______________________________ Document Released: 07/21/2004 Document Revised: 03/23/2011 Document Reviewed: 11/14/2010 [employer] Patient Information 2014 [employer]

## 2012-06-02 NOTE — Assessment & Plan Note (Signed)
Did lose 5 lbs since her last visit due to dietary changes.

## 2012-06-02 NOTE — Assessment & Plan Note (Signed)
Per bone density.  Emphasized that her smoking is contributing greatly to this.  Is taking calcium with vitamin D now as ordered.  Encouraged weightbearing exercise, but knee OA interferes.  Is trying to lose weight with diet.

## 2012-06-03 LAB — COMPREHENSIVE METABOLIC PANEL
ALT: 11 IU/L (ref 0–32)
AST: 17 IU/L (ref 0–40)
Albumin/Globulin Ratio: 1.8 (ref 1.1–2.5)
Albumin: 4.6 g/dL (ref 3.6–4.8)
Alkaline Phosphatase: 66 IU/L (ref 39–117)
BUN/Creatinine Ratio: 16 (ref 11–26)
BUN: 14 mg/dL (ref 8–27)
CO2: 24 mmol/L (ref 19–28)
Calcium: 9.7 mg/dL (ref 8.6–10.2)
Chloride: 104 mmol/L (ref 97–108)
Creatinine, Ser: 0.85 mg/dL (ref 0.57–1.00)
GFR calc Af Amer: 82 mL/min/{1.73_m2} (ref >59)
GFR calc non Af Amer: 71 mL/min/{1.73_m2} (ref >59)
Globulin, Total: 2.5 g/dL (ref 1.5–4.5)
Glucose: 98 mg/dL (ref 65–99)
Potassium: 4.5 mmol/L (ref 3.5–5.2)
Sodium: 141 mmol/L (ref 134–144)
Total Bilirubin: 0.4 mg/dL (ref 0.0–1.2)
Total Protein: 7.1 g/dL (ref 6.0–8.5)

## 2012-06-03 LAB — CBC WITH DIFFERENTIAL/PLATELET
Basophils Absolute: 0 10*3/uL (ref 0.0–0.2)
Basos: 1 % (ref 0–3)
Eos: 2 % (ref 0–5)
Eosinophils Absolute: 0.2 10*3/uL (ref 0.0–0.4)
HCT: 39.2 % (ref 34.0–46.6)
Hemoglobin: 13.1 g/dL (ref 11.1–15.9)
Immature Grans (Abs): 0 10*3/uL (ref 0.0–0.1)
Immature Granulocytes: 0 % (ref 0–2)
Lymphocytes Absolute: 1.8 10*3/uL (ref 0.7–3.1)
Lymphs: 21 % (ref 14–46)
MCH: 28.5 pg (ref 26.6–33.0)
MCHC: 33.4 g/dL (ref 31.5–35.7)
MCV: 85 fL (ref 79–97)
Monocytes Absolute: 0.7 10*3/uL (ref 0.1–0.9)
Monocytes: 8 % (ref 4–12)
Neutrophils Absolute: 5.8 10*3/uL (ref 1.4–7.0)
Neutrophils Relative %: 68 % (ref 40–74)
RBC: 4.59 x10E6/uL (ref 3.77–5.28)
RDW: 14.2 % (ref 12.3–15.4)
WBC: 8.6 10*3/uL (ref 3.4–10.8)

## 2012-06-03 LAB — LIPID PANEL
Chol/HDL Ratio: 4.4 ratio units (ref 0.0–4.4)
Cholesterol, Total: 160 mg/dL (ref 100–199)
HDL: 36 mg/dL — ABNORMAL LOW (ref >39)
LDL Calculated: 105 mg/dL — ABNORMAL HIGH (ref 0–99)
Triglycerides: 93 mg/dL (ref 0–149)
VLDL Cholesterol Cal: 19 mg/dL (ref 5–40)

## 2012-06-17 ENCOUNTER — Telehealth: Payer: Self-pay | Admitting: *Deleted

## 2012-06-17 NOTE — Telephone Encounter (Signed)
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 Told patient to bring in [employer] with a note stating why she wants to be dismissed and we will leave it for Dr. Renato Gails. Patient Agreed.

## 2012-08-31 DIAGNOSIS — H18419 Arcus senilis, unspecified eye: Secondary | ICD-10-CM | POA: Diagnosis not present

## 2012-08-31 DIAGNOSIS — H11159 Pinguecula, unspecified eye: Secondary | ICD-10-CM | POA: Diagnosis not present

## 2012-08-31 DIAGNOSIS — H4011X0 Primary open-angle glaucoma, stage unspecified: Secondary | ICD-10-CM | POA: Diagnosis not present

## 2012-08-31 DIAGNOSIS — H409 Unspecified glaucoma: Secondary | ICD-10-CM | POA: Diagnosis not present

## 2012-10-03 ENCOUNTER — Encounter: Payer: Self-pay | Admitting: Internal Medicine

## 2012-10-03 ENCOUNTER — Ambulatory Visit (INDEPENDENT_AMBULATORY_CARE_PROVIDER_SITE_OTHER): Payer: Medicare Other | Admitting: Internal Medicine

## 2012-10-03 VITALS — BP 130/88 | HR 99 | Temp 97.5°F | Ht 65.5 in | Wt 216.0 lb

## 2012-10-03 DIAGNOSIS — IMO0002 Reserved for concepts with insufficient information to code with codable children: Secondary | ICD-10-CM | POA: Diagnosis not present

## 2012-10-03 DIAGNOSIS — R7309 Other abnormal glucose: Secondary | ICD-10-CM

## 2012-10-03 DIAGNOSIS — R739 Hyperglycemia, unspecified: Secondary | ICD-10-CM | POA: Insufficient documentation

## 2012-10-03 DIAGNOSIS — F17200 Nicotine dependence, unspecified, uncomplicated: Secondary | ICD-10-CM

## 2012-10-03 DIAGNOSIS — I10 Essential (primary) hypertension: Secondary | ICD-10-CM

## 2012-10-03 DIAGNOSIS — M8580 Other specified disorders of bone density and structure, unspecified site: Secondary | ICD-10-CM

## 2012-10-03 DIAGNOSIS — Z23 Encounter for immunization: Secondary | ICD-10-CM

## 2012-10-03 DIAGNOSIS — M899 Disorder of bone, unspecified: Secondary | ICD-10-CM | POA: Diagnosis not present

## 2012-10-03 DIAGNOSIS — Z720 Tobacco use: Secondary | ICD-10-CM | POA: Insufficient documentation

## 2012-10-03 DIAGNOSIS — M1710 Unilateral primary osteoarthritis, unspecified knee: Secondary | ICD-10-CM | POA: Diagnosis not present

## 2012-10-03 DIAGNOSIS — E669 Obesity, unspecified: Secondary | ICD-10-CM

## 2012-10-03 DIAGNOSIS — E785 Hyperlipidemia, unspecified: Secondary | ICD-10-CM

## 2012-10-03 MED ORDER — PRAVASTATIN SODIUM 80 MG PO TABS: ORAL_TABLET | ORAL | Status: DC

## 2012-10-03 NOTE — Progress Notes (Signed)
Patient ID: Alison [name], female   DOB: [birth year], 67 y.o.   MRN: [id number] Location:  [facility] / [place]  Code Status: full code  No Known Allergies  Chief Complaint  Patient presents with  . Medical Managment of Chronic Issues    4 month follow-up     HPI: Patient is a 67 y.o. AA female seen in the office today for medical management of Chronic Issues.  Still having problems with knees, stated last visit that she would f/u with orthopedics but still has not. Uses Gel and Celebrex and that helps relieve some of the pain.  Blood pressure is better this visit. Continuing to watch her diet. Still not able to really exercise but takes care of her mother and states that requires a lot of her time.  States allergies are acting up. States she has mucus in the nose and in the morning she can feel it dripping down then back of her throat. States that she will get something over the counter to use. States that she doesn't feel bad though.   Review of Systems:  Review of Systems  Constitutional: Negative for fever, chills and weight loss.  HENT: Positive for congestion. Negative for hearing loss and sore throat.   Eyes: Negative for blurred vision and double vision.  Respiratory: Negative for shortness of breath.   Cardiovascular: Negative for chest pain.  Gastrointestinal: Negative for nausea, vomiting, diarrhea and constipation.  Genitourinary: Negative for dysuria.  Musculoskeletal: Positive for joint pain.       Knees still ache  Neurological: Negative for dizziness.  Psychiatric/Behavioral: Negative for depression. The patient is not nervous/anxious.      Past Medical History  Diagnosis Date  . Unspecified vitamin D deficiency   . Obesity, unspecified   . Tobacco use disorder   . Goiter, specified as simple   . Dizziness and giddiness   . Pain in joint, ankle and foot   . Pernicious anemia   . Hyperlipidemia LDL goal < 100   . Unspecified  glaucoma(365.9)   . Benign essential hypertension   . Diverticulitis of colon (without mention of hemorrhage)   . Other specified disease of nail   . Osteoarthrosis, unspecified whether generalized or localized, lower leg   . Osteopenia     Past Surgical History  Procedure Laterality Date  . Colectomy  2009    Social History:   reports that she has been smoking.  She does not have any smokeless tobacco history on file. She reports that she does not drink alcohol or use illicit drugs.  Family History  Problem Relation Age of Onset  . Diabetes Sister     Medications: Patient's Medications  New Prescriptions   No medications on file  Previous Medications   ACETAMINOPHEN (TYLENOL ARTHRITIS PAIN PO)    Take one to two tablets once daily as needed to help pain   AMLODIPINE (NORVASC) 10 MG TABLET    Take one tablet once daily for blood pressure and to protect the heart   CELECOXIB (CELEBREX) 200 MG CAPSULE    Take one capsule by mouth daily for knee pain.   LATANOPROST (XALATAN) 0.005 % OPHTHALMIC SOLUTION    One drop in each eye at night   PRAVASTATIN (PRAVACHOL) 80 MG TABLET    Take one tablet once a day for cholesterol  Modified Medications   No medications on file  Discontinued Medications   No medications on file     Physical  Exam: Filed Vitals:   10/03/12 0822  BP: 130/88  Pulse: 99  Temp: 97.5 F (36.4 C)  TempSrc: Oral  Height: 5' 5.5" (1.664 m)  Weight: 216 lb (97.977 kg)  SpO2: 98%   Physical Exam  Constitutional: She is oriented to person, place, and time. She appears well-developed.  HENT:  Nose: Rhinorrhea present.  Cardiovascular: Normal rate, regular rhythm, normal heart sounds and intact distal pulses.   Pulmonary/Chest: Effort normal and breath sounds normal.  Abdominal: Soft. Bowel sounds are normal.  Neurological: She is alert and oriented to person, place, and time.  Skin: Skin is warm and dry.  Psychiatric: She has a normal mood and affect.      Labs reviewed: Basic Metabolic Panel:  Recent Labs  78/29/56 1025  NA 141  K 4.5  CL 104  CO2 24  GLUCOSE 98  BUN 14  CREATININE 0.85  CALCIUM 9.7   Liver Function Tests:  Recent Labs  06/02/12 1025  AST 17  ALT 11  ALKPHOS 66  BILITOT 0.4  PROT 7.1  CBC:  Recent Labs  06/02/12 1025  WBC 8.6  NEUTROABS 5.8  HGB 13.1  HCT 39.2  MCV 85   Lipid Panel:  Recent Labs  06/02/12 1025  HDL 36*  LDLCALC 105*  TRIG 93  CHOLHDL 4.4   Assessment/Plan 1. Need for prophylactic vaccination and inoculation against influenza -received flu shot today  2. Osteoarthrosis, unspecified whether generalized or localized, lower leg -encouraged to go to orthopedics as previously referred due to inability to ambulate well b/c of her knee pain so unable to lose weight  3. Osteopenia -continue on vitamin D, encouraged weight bearing exercise  4. Benign essential hypertension -at goal with current meds - Basic metabolic panel; Future  5.  Hyperlipidemia LDL goal < 100 - pravastatin (PRAVACHOL) 80 MG tablet; Take one tablet once a day for cholesterol  Dispense: 90 tablet; Refill: 1 - Lipid panel; Future before next visit--LDL was just above goal last time  6. Obesity, unspecified - had one hyperglycemic reading on 5/14 labs so will r/o DMII with hba1c - Hemoglobin A1c; Future - Lipid panel; Future  7. Hyperglycemia - Hemoglobin A1c; Future  8.  Tobacco abuse--she has cut back to 4 cigarettes the past couple of days;  Encouraged her to continue this;  Unfortunately, cannot attend smoking cessation group due to caregiving responsibilities, and does not want to take any medications for smoking cessation  Labs/tests ordered:  Hba1c, bmp, FLP before next visit in 3 mos Next appt: 3 mos

## 2012-10-29 DIAGNOSIS — M47817 Spondylosis without myelopathy or radiculopathy, lumbosacral region: Secondary | ICD-10-CM | POA: Diagnosis not present

## 2012-10-29 DIAGNOSIS — M4310 Spondylolisthesis, site unspecified: Secondary | ICD-10-CM | POA: Diagnosis not present

## 2012-10-29 DIAGNOSIS — M549 Dorsalgia, unspecified: Secondary | ICD-10-CM | POA: Diagnosis not present

## 2012-11-08 DIAGNOSIS — Q762 Congenital spondylolisthesis: Secondary | ICD-10-CM | POA: Diagnosis not present

## 2012-11-25 DIAGNOSIS — M1710 Unilateral primary osteoarthritis, unspecified knee: Secondary | ICD-10-CM | POA: Diagnosis not present

## 2012-12-26 ENCOUNTER — Other Ambulatory Visit: Payer: Medicare Other

## 2012-12-26 DIAGNOSIS — E785 Hyperlipidemia, unspecified: Secondary | ICD-10-CM | POA: Diagnosis not present

## 2012-12-26 DIAGNOSIS — I10 Essential (primary) hypertension: Secondary | ICD-10-CM

## 2012-12-26 DIAGNOSIS — E669 Obesity, unspecified: Secondary | ICD-10-CM | POA: Diagnosis not present

## 2012-12-26 DIAGNOSIS — R7309 Other abnormal glucose: Secondary | ICD-10-CM | POA: Diagnosis not present

## 2012-12-26 DIAGNOSIS — R739 Hyperglycemia, unspecified: Secondary | ICD-10-CM

## 2012-12-27 LAB — BASIC METABOLIC PANEL
BUN/Creatinine Ratio: 11 (ref 11–26)
BUN: 10 mg/dL (ref 8–27)
CO2: 23 mmol/L (ref 18–29)
Calcium: 9.6 mg/dL (ref 8.6–10.2)
Chloride: 104 mmol/L (ref 97–108)
Creatinine, Ser: 0.91 mg/dL (ref 0.57–1.00)
GFR calc Af Amer: 75 mL/min/{1.73_m2} (ref >59)
GFR calc non Af Amer: 65 mL/min/{1.73_m2} (ref >59)
Glucose: 110 mg/dL — ABNORMAL HIGH (ref 65–99)
Potassium: 4.1 mmol/L (ref 3.5–5.2)
Sodium: 143 mmol/L (ref 134–144)

## 2012-12-27 LAB — HEMOGLOBIN A1C
Est. average glucose Bld gHb Est-mCnc: 146 mg/dL
Hgb A1c MFr Bld: 6.7 % — ABNORMAL HIGH (ref 4.8–5.6)

## 2012-12-27 LAB — LIPID PANEL
Chol/HDL Ratio: 5.1 ratio units — ABNORMAL HIGH (ref 0.0–4.4)
Cholesterol, Total: 157 mg/dL (ref 100–199)
HDL: 31 mg/dL — ABNORMAL LOW (ref >39)
LDL Calculated: 104 mg/dL — ABNORMAL HIGH (ref 0–99)
Triglycerides: 108 mg/dL (ref 0–149)
VLDL Cholesterol Cal: 22 mg/dL (ref 5–40)

## 2012-12-29 ENCOUNTER — Ambulatory Visit (INDEPENDENT_AMBULATORY_CARE_PROVIDER_SITE_OTHER): Payer: Medicare Other | Admitting: Internal Medicine

## 2012-12-29 ENCOUNTER — Encounter: Payer: Self-pay | Admitting: Internal Medicine

## 2012-12-29 VITALS — BP 140/86 | HR 62 | Wt 214.0 lb

## 2012-12-29 DIAGNOSIS — M8580 Other specified disorders of bone density and structure, unspecified site: Secondary | ICD-10-CM

## 2012-12-29 DIAGNOSIS — E785 Hyperlipidemia, unspecified: Secondary | ICD-10-CM | POA: Diagnosis not present

## 2012-12-29 DIAGNOSIS — E119 Type 2 diabetes mellitus without complications: Secondary | ICD-10-CM

## 2012-12-29 DIAGNOSIS — Z720 Tobacco use: Secondary | ICD-10-CM

## 2012-12-29 DIAGNOSIS — I10 Essential (primary) hypertension: Secondary | ICD-10-CM | POA: Diagnosis not present

## 2012-12-29 DIAGNOSIS — Z23 Encounter for immunization: Secondary | ICD-10-CM | POA: Diagnosis not present

## 2012-12-29 DIAGNOSIS — F17200 Nicotine dependence, unspecified, uncomplicated: Secondary | ICD-10-CM | POA: Diagnosis not present

## 2012-12-29 DIAGNOSIS — E669 Obesity, unspecified: Secondary | ICD-10-CM

## 2012-12-29 DIAGNOSIS — M899 Disorder of bone, unspecified: Secondary | ICD-10-CM

## 2012-12-29 MED ORDER — METFORMIN HCL 500 MG PO TABS: 250.0000 mg | ORAL_TABLET | Freq: Every day | ORAL | Status: DC

## 2012-12-29 MED ORDER — FISH OIL BURP-LESS 1000 MG PO CAPS: 1000.0000 mg | ORAL_CAPSULE | Freq: Every day | ORAL | Status: DC

## 2012-12-29 NOTE — Patient Instructions (Signed)
Diets for Diabetes, Food Labeling °Look at food labels to help you decide how much of a product you can eat. You will want to check the amount of total carbohydrate in a serving to see how the food fits into your meal plan. In the list of ingredients, the ingredient present in the largest amount by weight must be listed first, followed by the other ingredients in descending order. °STANDARD OF IDENTITY °Most products have a list of ingredients. However, foods that the Food and Drug Administration (FDA) has given a standard of identity do not need a list of ingredients. A standard of identity means that a food must contain certain ingredients if it is called a particular name. Examples are mayonnaise, peanut butter, ketchup, jelly, and cheese. °LABELING TERMS °There are many terms found on food labels. Some of these terms have specific definitions. Some terms are regulated by the FDA, and the FDA has clearly specified how they can be used. Others are not regulated or well-defined and can be misleading and confusing. °SPECIFICALLY DEFINED TERMS °Nutritive Sweetener. °· A sweetener that contains calories,such as table sugar or honey. °Nonnutritive Sweetener. °· A sweetener with few or no calories,such as saccharin, aspartame, sucralose, and cyclamate. °LABELING TERMS REGULATED BY THE FDA °Free. °· The product contains only a tiny or small amount of fat, cholesterol, sodium, sugar, or calories. For example, a "fat-free" product will contain less than 0.5 g of fat per serving. °Low. °· A food described as "low" in fat, saturated fat, cholesterol, sodium, or calories could be eaten fairly often without exceeding dietary guidelines. For example, "low in fat" means no more than 3 g of fat per serving. °Lean. °· "Lean" and "extra lean" are U.S. Department of Agriculture (USDA) terms for use on meat and poultry products. "Lean" means the product contains less than 10 g of fat, 4 g of saturated fat, and 95 mg of cholesterol  per serving. "Lean" is not as low in fat as a product labeled "low." °Extra Lean. °· "Extra lean" means the product contains less than 5 g of fat, 2 g of saturated fat, and 95 mg of cholesterol per serving. While "extra lean" has less fat than "lean," it is still higher in fat than a product labeled "low." °Reduced, Less, Fewer. °· A diet product that contains 25% less of a nutrient or calories than the regular version. For example, hot dogs might be labeled "25% less fat than our regular hot dogs." °Light/Lite. °· A diet product that contains  fewer calories or ½ the fat of the original. For example, "light in sodium" means a product with ½ the usual sodium. °More. °· One serving contains at least 10% more of the daily value of a vitamin, mineral, or fiber than usual. °Good Source Of. °· One serving contains 10% to 19% of the daily value for a particular vitamin, mineral, or fiber. °Excellent Source Of. °· One serving contains 20% or more of the daily value for a particular nutrient. Other terms used might be "high in" or "rich in." °Enriched or Fortified. °· The product contains added vitamins, minerals, or protein. Nutrition labeling must be used on enriched or fortified foods. °Imitation. °· The product has been altered so that it is lower in protein, vitamins, or minerals than the usual food,such as imitation peanut butter. °Total Fat. °· The number listed is the total of all fat found in a serving of the product. Under total fat, food labels must list saturated fat and   trans fat, which are associated with raising bad cholesterol and an increased risk of heart blood vessel disease. °Saturated Fat. °· Mainly fats from animal-based sources. Some examples are red meat, cheese, cream, whole milk, and coconut oil. °Trans Fat. °· Found in some fried snack foods, packaged foods, and fried restaurant foods. It is recommended you eat as close to 0 g of trans fat as possible, since it raises bad cholesterol and lowers  good cholesterol. °Polyunsaturated and Monounsaturated Fats. °· More healthful fats. These fats are from plant sources. °Total Carbohydrate. °· The number of carbohydrate grams in a serving of the product. Under total carbohydrate are listed the other carbohydrate sources, such as dietary fiber and sugars. °Dietary Fiber. °· A carbohydrate from plant sources. °Sugars. °· Sugars listed on the label contain all naturally occurring sugars as well as added sugars. °LABELING TERMS NOT REGULATED BY THE FDA °Sugarless. °· Table sugar (sucrose) has not been added. However, the manufacturer may use another form of sugar in place of sucrose to sweeten the product. For example, sugar alcohols are used to sweeten foods. Sugar alcohols are a form of sugar but are not table sugar. If a product contains sugar alcohols in place of sucrose, it can still be labeled "sugarless." °Low Salt, Salt-Free, Unsalted, No Salt, No Salt Added, Without Added Salt. °· Food that is usually processed with salt has been made without salt. However, the food may contain sodium-containing additives, such as preservatives, leavening agents, or flavorings. °Natural. °· This term has no legal meaning. °Organic. °· Foods that are certified as organic have been inspected and approved by the USDA to ensure they are produced without pesticides, fertilizers containing synthetic ingredients, bioengineering, or ionizing radiation. °Document Released: 01/01/2003 Document Revised: 03/23/2011 Document Reviewed: 07/19/2008 °[employer]® Patient Information ©2014 [employer]. ° °Diabetes Meal Planning Guide °The diabetes meal planning guide is a tool to help you plan your meals and snacks. It is important for people with diabetes to manage their blood glucose (sugar) levels. Choosing the right foods and the right amounts throughout your day will help control your blood glucose. Eating right can even help you improve your blood pressure and reach or maintain a healthy  weight. °CARBOHYDRATE COUNTING MADE EASY °When you eat carbohydrates, they turn to sugar. This raises your blood glucose level. Counting carbohydrates can help you control this level so you feel better. When you plan your meals by counting carbohydrates, you can have more flexibility in what you eat and balance your medicine with your food intake. °Carbohydrate counting simply means adding up the total amount of carbohydrate grams in your meals and snacks. Try to eat about the same amount at each meal. Foods with carbohydrates are listed below. Each portion below is 1 carbohydrate serving or 15 grams of carbohydrates. Ask your dietician how many grams of carbohydrates you should eat at each meal or snack. °Grains and Starches °· 1 slice bread. °· ½ English muffin or hotdog/hamburger bun. °· ¾ cup cold cereal (unsweetened). °·  cup cooked pasta or rice. °· ½ cup starchy vegetables (corn, potatoes, peas, beans, winter squash). °· 1 tortilla (6 inches). °· ¼ bagel. °· 1 waffle or pancake (size of a CD). °· ½ cup cooked cereal. °· 4 to 6 small crackers. °*Whole grain is recommended. °Fruit °· 1 cup fresh unsweetened berries, melon, papaya, pineapple. °· 1 small fresh fruit. °· ½ banana or mango. °· ½ cup fruit juice (4 oz unsweetened). °· ½ cup canned fruit in natural   juice or water. °· 2 tbs dried fruit. °· 12 to 15 grapes or cherries. °Milk and Yogurt °· 1 cup fat-free or 1% milk. °· 1 cup soy milk. °· 6 oz light yogurt with sugar-free sweetener. °· 6 oz low-fat soy yogurt. °· 6 oz plain yogurt. °Vegetables °· 1 cup raw or ½ cup cooked is counted as 0 carbohydrates or a "free" food. °· If you eat 3 or more servings at 1 meal, count them as 1 carbohydrate serving. °Other Carbohydrates °· ¾ oz chips or pretzels. °· ½ cup ice cream or frozen yogurt. °· ¼ cup sherbet or sorbet. °· 2 inch square cake, no frosting. °· 1 tbs honey, sugar, jam, jelly, or syrup. °· 2 small cookies. °· 3 squares of graham crackers. °· 3 cups  popcorn. °· 6 crackers. °· 1 cup broth-based soup. °· Count 1 cup casserole or other mixed foods as 2 carbohydrate servings. °· Foods with less than 20 calories in a serving may be counted as 0 carbohydrates or a "free" food. °You may want to purchase a book or computer software that lists the carbohydrate gram counts of different foods. In addition, the nutrition facts panel on the labels of the foods you eat are a good source of this information. The label will tell you how big the serving size is and the total number of carbohydrate grams you will be eating per serving. Divide this number by 15 to obtain the number of carbohydrate servings in a portion. Remember, 1 carbohydrate serving equals 15 grams of carbohydrate. °SERVING SIZES °Measuring foods and serving sizes helps you make sure you are getting the right amount of food. The list below tells how big or small some common serving sizes are. °· 1 oz.........4 stacked dice. °· 3 oz.........Deck of cards. °· 1 tsp........Tip of little finger. °· 1 tbs........Thumb. °· 2 tbs........Golf ball. °· ½ cup.......Half of a fist. °· 1 cup........A fist. °SAMPLE DIABETES MEAL PLAN °Below is a sample meal plan that includes foods from the grain and starches, dairy, vegetable, fruit, and meat groups. A dietician can individualize a meal plan to fit your calorie needs and tell you the number of servings needed from each food group. However, controlling the total amount of carbohydrates in your meal or snack is more important than making sure you include all of the food groups at every meal. You may interchange carbohydrate containing foods (dairy, starches, and fruits). °The meal plan below is an example of a 2000 calorie diet using carbohydrate counting. This meal plan has 17 carbohydrate servings. °Breakfast °· 1 cup oatmeal (2 carb servings). °· ¾ cup light yogurt (1 carb serving). °· 1 cup blueberries (1 carb serving). °· ¼ cup almonds. °Snack °· 1 large apple (2 carb  servings). °· 1 low-fat string cheese stick. °Lunch °· Chicken breast salad. °· 1 cup spinach. °· ¼ cup chopped tomatoes. °· 2 oz chicken breast, sliced. °· 2 tbs low-fat Italian dressing. °· 12 whole-wheat crackers (2 carb servings). °· 12 to 15 grapes (1 carb serving). °· 1 cup low-fat milk (1 carb serving). °Snack °· 1 cup carrots. °· ½ cup hummus (1 carb serving). °Dinner °· 3 oz broiled salmon. °· 1 cup brown rice (3 carb servings). °Snack °· 1 ½ cups steamed broccoli (1 carb serving) drizzled with 1 tsp olive oil and lemon juice. °· 1 cup light pudding (2 carb servings). °DIABETES MEAL PLANNING WORKSHEET °Your dietician can use this worksheet to help you decide how many servings   of foods and what types of foods are right for you.  °BREAKFAST °Food Group and Servings / Carb Servings °Grain/Starches __________________________________ °Dairy __________________________________________ °Vegetable ______________________________________ °Fruit ___________________________________________ °Meat __________________________________________ °Fat ____________________________________________ °LUNCH °Food Group and Servings / Carb Servings °Grain/Starches ___________________________________ °Dairy ___________________________________________ °Fruit ____________________________________________ °Meat ___________________________________________ °Fat _____________________________________________ °DINNER °Food Group and Servings / Carb Servings °Grain/Starches ___________________________________ °Dairy ___________________________________________ °Fruit ____________________________________________ °Meat ___________________________________________ °Fat _____________________________________________ °SNACKS °Food Group and Servings / Carb Servings °Grain/Starches ___________________________________ °Dairy ___________________________________________ °Vegetable _______________________________________ °Fruit  ____________________________________________ °Meat ___________________________________________ °Fat _____________________________________________ °DAILY TOTALS °Starches _________________________ °Vegetable ________________________ °Fruit ____________________________ °Dairy ____________________________ °Meat ____________________________ °Fat ______________________________ °Document Released: 09/25/2004 Document Revised: 03/23/2011 Document Reviewed: 08/06/2008 °[employer]® Patient Information ©2014 [employer]. ° ° °

## 2012-12-29 NOTE — Progress Notes (Signed)
Patient ID: Alison [name], female   DOB: 1945-02-08, 67 y.o.   MRN: [id number]   Location:  [facility]  No Known Allergies  Chief Complaint  Patient presents with  . Medical Managment of Chronic Issues    3 month follow-up, discuss labs (copy printed)     HPI: Patient is a 67 y.o. black female seen in the office today for obesity, htn, hyperlipidemia, and hyperglycemia which has now actually gone into the diabetic range at 6.7.  Discussed that I need to treat her diabetes.    Has been trying to avoid starchy foods.  Has difficulty walking much due to diabetes.  Is clearly very upset about this new diagnosis.  Review of Systems:  Review of Systems  Constitutional: Negative for fever and chills.  HENT: Negative for congestion.   Eyes: Negative for blurred vision.  Respiratory: Negative for cough and shortness of breath.   Cardiovascular: Negative for chest pain.  Gastrointestinal: Negative for constipation.  Genitourinary: Negative for dysuria.  Musculoskeletal: Negative for falls.  Skin: Negative for rash.  Neurological: Negative for dizziness.  Endo/Heme/Allergies:       New diabetes  Psychiatric/Behavioral: Negative for depression and memory loss.     Past Medical History  Diagnosis Date  . Unspecified vitamin D deficiency   . Obesity, unspecified   . Tobacco use disorder   . Goiter, specified as simple   . Dizziness and giddiness   . Pain in joint, ankle and foot   . Pernicious anemia   . Hyperlipidemia LDL goal < 100   . Unspecified glaucoma(365.9)   . Benign essential hypertension   . Diverticulitis of colon (without mention of hemorrhage)   . Other specified disease of nail   . Osteoarthrosis, unspecified whether generalized or localized, lower leg   . Osteopenia     Past Surgical History  Procedure Laterality Date  . Colectomy  2009    Social History:   reports that she has been smoking.  She does not have any  smokeless tobacco history on file. She reports that she does not drink alcohol or use illicit drugs.  Family History  Problem Relation Age of Onset  . Diabetes Sister     Medications: Patient's Medications  New Prescriptions   No medications on file  Previous Medications   ACETAMINOPHEN (TYLENOL ARTHRITIS PAIN PO)    Take one to two tablets once daily as needed to help pain   AMLODIPINE (NORVASC) 10 MG TABLET    Take one tablet once daily for blood pressure and to protect the heart   CALCIUM CARBONATE-VITAMIN D (CALTRATE 600+D PO)    Take by mouth daily.   CELECOXIB (CELEBREX) 200 MG CAPSULE    Take one capsule by mouth daily for knee pain.   LATANOPROST (XALATAN) 0.005 % OPHTHALMIC SOLUTION    One drop in each eye at night   MULTIPLE VITAMINS-MINERALS (CENTRUM PO)    Take by mouth daily.   PRAVASTATIN (PRAVACHOL) 80 MG TABLET    Take one tablet once a day for cholesterol  Modified Medications   No medications on file  Discontinued Medications   No medications on file     Physical Exam: Filed Vitals:   12/29/12 0849  BP: 140/86  Pulse: 62  Weight: 214 lb (97.07 kg)  SpO2: 99%  Physical Exam  Constitutional: She is oriented to person, place, and time. She appears well-developed and well-nourished. No distress.  Cardiovascular: Normal rate, regular  rhythm, normal heart sounds and intact distal pulses.   Pulmonary/Chest: Effort normal and breath sounds normal. No respiratory distress.  Abdominal: Soft. Bowel sounds are normal. She exhibits no distension. There is no tenderness.  Musculoskeletal: Normal range of motion.  Neurological: She is alert and oriented to person, place, and time.  Skin: Skin is warm and dry.  Psychiatric: She has a normal mood and affect.    Labs reviewed: Basic Metabolic Panel:  Recent Labs  16/10/96 1025 12/26/12 0810  NA 141 143  K 4.5 4.1  CL 104 104  CO2 24 23  GLUCOSE 98 110*  BUN 14 10  CREATININE 0.85 0.91  CALCIUM 9.7 9.6    Liver Function Tests:  Recent Labs  06/02/12 1025  AST 17  ALT 11  ALKPHOS 66  BILITOT 0.4  PROT 7.1  CBC:  Recent Labs  06/02/12 1025  WBC 8.6  NEUTROABS 5.8  HGB 13.1  HCT 39.2  MCV 85   Lipid Panel:  Recent Labs  06/02/12 1025 12/26/12 0810  HDL 36* 31*  LDLCALC 105* 045*  TRIG 93 108  CHOLHDL 4.4 5.1*   Lab Results  Component Value Date   HGBA1C 6.7* 12/26/2012    Assessment/Plan 1. Type II diabetes mellitus -want her to see Edison Pace, but she wants to wait on this -try low dose metformin with supper (her biggest meal) to start -has not been very adherent with diet or exercise up to this point - metFORMIN (GLUCOPHAGE) 500 MG tablet; Take 0.5 tablets (250 mg total) by mouth daily with supper.  Dispense: 15 tablet; Refill: 3 - Hemoglobin A1c; Future - CBC with Differential; Future - Basic metabolic panel; Future  2. Benign essential hypertension -bp should be a little better also, but I didn't want to change everything in one day -cont current meds and if still over 130 next time, will increase bp meds--needs more aggressive risk factor control b/c of high risk of stroke and MI with her risk factors  3. Hyperlipidemia LDL goal < 100 -LDL still not at goal of at least <100 -cannot afford statins other than pravachol so pravachol 80 continued and add fish oil to help increase HDL - Omega-3 Fatty Acids (FISH OIL BURP-LESS) 1000 MG CAPS; Take 1,000 mg by mouth daily.  Dispense: 30 capsule; Refill: 3 - Lipid panel; Future  4. Tobacco abuse -again encouraged to quit, but is not ready  5. Obesity, unspecified -encouraged exercise and given diet in instructions today  6. Osteopenia -cont vit d supplement  7. Need for prophylactic vaccination against Streptococcus pneumoniae (pneumococcus) -given pneumovax today;  Will need prevnar in 6 mos if medicare will cover by then  Labs/tests ordered:   Orders Placed This Encounter  Procedures  .  Pneumococcal polysaccharide vaccine 23-valent greater than or equal to 2yo subcutaneous/IM  . Hemoglobin A1c    Standing Status: Future     Number of Occurrences:      Standing Expiration Date: 06/29/2013  . Lipid panel    Standing Status: Future     Number of Occurrences:      Standing Expiration Date: 06/29/2013    Order Specific Question:  Has the patient fasted?    Answer:  Yes  . CBC with Differential    Standing Status: Future     Number of Occurrences:      Standing Expiration Date: 06/29/2013  . Basic metabolic panel    Standing Status: Future     Number of Occurrences:  Standing Expiration Date: 06/29/2013    Order Specific Question:  Has the patient fasted?    Answer:  Yes   Next appt:  3 mos

## 2013-01-09 DIAGNOSIS — M1710 Unilateral primary osteoarthritis, unspecified knee: Secondary | ICD-10-CM | POA: Diagnosis not present

## 2013-01-23 ENCOUNTER — Encounter (HOSPITAL_COMMUNITY): Payer: Self-pay | Admitting: Pharmacy Technician

## 2013-01-25 NOTE — Patient Instructions (Addendum)
Alison [name]  01/25/2013                           YOUR PROCEDURE IS SCHEDULED ON: 02/01/13               PLEASE REPORT TO SHORT STAY CENTER AT : 6:30 AM               CALL THIS NUMBER IF ANY PROBLEMS THE DAY OF SURGERY :               [phone]                      REMEMBER:   Do not eat food or drink liquids AFTER MIDNIGHT   Take these medicines the morning of surgery with A SIP OF WATER:  AMLODIPINE / PRAVASTATIN   Do not wear jewelry, make-up   Do not wear lotions, powders, or perfumes.   Do not shave legs or underarms 12 hrs. before surgery (men may shave face)  Do not bring valuables to the hospital.  Contacts, dentures or bridgework may not be worn into surgery.  Leave suitcase in the car. After surgery it may be brought to your room.  For patients admitted to the hospital more than one night, checkout time is 11:00                          The day of discharge.   Patients discharged the day of surgery will not be allowed to drive home                             If going home same day of surgery, must have someone stay with you first                           24 hrs at home and arrange for some one to drive you home from hospital.    Special Instructions:   Please read over the following fact sheets that you were given:               1. MRSA  INFORMATION                      2. [place]               3. [place]                                                X_____________________________________________________________________        Failure to follow these instructions may result in cancellation of your surgery

## 2013-01-26 ENCOUNTER — Encounter (HOSPITAL_COMMUNITY)
Admission: RE | Admit: 2013-01-26 | Discharge: 2013-01-26 | Disposition: A | Discharge status: Home / Self Care | Payer: Medicare Other | Source: Ambulatory Visit | Attending: Orthopedic Surgery | Admitting: Orthopedic Surgery

## 2013-01-26 ENCOUNTER — Encounter (HOSPITAL_COMMUNITY): Payer: Self-pay

## 2013-01-26 ENCOUNTER — Ambulatory Visit (HOSPITAL_COMMUNITY)
Admission: RE | Admit: 2013-01-26 | Discharge: 2013-01-26 | Disposition: A | Discharge status: Home / Self Care | Payer: Medicare Other | Source: Ambulatory Visit | Attending: Surgical | Admitting: Surgical

## 2013-01-26 DIAGNOSIS — I10 Essential (primary) hypertension: Secondary | ICD-10-CM | POA: Insufficient documentation

## 2013-01-26 DIAGNOSIS — Z01818 Encounter for other preprocedural examination: Secondary | ICD-10-CM | POA: Diagnosis not present

## 2013-01-26 DIAGNOSIS — M1710 Unilateral primary osteoarthritis, unspecified knee: Secondary | ICD-10-CM | POA: Insufficient documentation

## 2013-01-26 DIAGNOSIS — Z01810 Encounter for preprocedural cardiovascular examination: Secondary | ICD-10-CM | POA: Diagnosis not present

## 2013-01-26 DIAGNOSIS — J984 Other disorders of lung: Secondary | ICD-10-CM | POA: Diagnosis not present

## 2013-01-26 DIAGNOSIS — Z01812 Encounter for preprocedural laboratory examination: Secondary | ICD-10-CM | POA: Insufficient documentation

## 2013-01-26 HISTORY — DX: Anesthesia of skin: R20.0

## 2013-01-26 HISTORY — DX: Type 2 diabetes mellitus without complications: E11.9

## 2013-01-26 LAB — COMPREHENSIVE METABOLIC PANEL
ALT: 17 U/L (ref 0–35)
AST: 15 U/L (ref 0–37)
Albumin: 3.9 g/dL (ref 3.5–5.2)
Alkaline Phosphatase: 65 U/L (ref 39–117)
BUN: 19 mg/dL (ref 6–23)
CO2: 25 mEq/L (ref 19–32)
Calcium: 9.3 mg/dL (ref 8.4–10.5)
Chloride: 104 mEq/L (ref 96–112)
Creatinine, Ser: 0.79 mg/dL (ref 0.50–1.10)
GFR calc Af Amer: >90 mL/min (ref >90)
GFR calc non Af Amer: 84 mL/min — ABNORMAL LOW (ref >90)
Glucose, Bld: 98 mg/dL (ref 70–99)
Potassium: 4.1 mEq/L (ref 3.7–5.3)
Sodium: 141 mEq/L (ref 137–147)
Total Bilirubin: 0.3 mg/dL (ref 0.3–1.2)
Total Protein: 7.4 g/dL (ref 6.0–8.3)

## 2013-01-26 LAB — URINALYSIS, ROUTINE W REFLEX MICROSCOPIC
Bilirubin Urine: NEGATIVE
Glucose, UA: NEGATIVE mg/dL
Ketones, ur: NEGATIVE mg/dL
Leukocytes, UA: NEGATIVE
Nitrite: NEGATIVE
Protein, ur: NEGATIVE mg/dL
Specific Gravity, Urine: 1.01 (ref 1.005–1.030)
Urobilinogen, UA: 0.2 mg/dL (ref 0.0–1.0)
pH: 5 (ref 5.0–8.0)

## 2013-01-26 LAB — CBC
HEMATOCRIT: 42.2 % (ref 36.0–46.0)
HEMOGLOBIN: 14.2 g/dL (ref 12.0–15.0)
MCH: 28.9 pg (ref 26.0–34.0)
MCHC: 33.6 g/dL (ref 30.0–36.0)
MCV: 85.9 fL (ref 78.0–100.0)
Platelets: 330 10*3/uL (ref 150–400)
RBC: 4.91 MIL/uL (ref 3.87–5.11)
RDW: 14.2 % (ref 11.5–15.5)
WBC: 16.3 10*3/uL — ABNORMAL HIGH (ref 4.0–10.5)

## 2013-01-26 LAB — URINE MICROSCOPIC-ADD ON

## 2013-01-26 LAB — PROTIME-INR
INR: 0.96 (ref 0.00–1.49)
Prothrombin Time: 12.6 seconds (ref 11.6–15.2)

## 2013-01-26 LAB — APTT: aPTT: 30 seconds (ref 24–37)

## 2013-01-26 LAB — SURGICAL PCR SCREEN
MRSA, PCR: NEGATIVE
STAPHYLOCOCCUS AUREUS: NEGATIVE

## 2013-01-26 NOTE — Progress Notes (Signed)
Abnormal CXR faxed to Dr. Gladstone Lighter

## 2013-01-26 NOTE — Progress Notes (Signed)
CBC UA faxed to Dr. Gladstone Lighter

## 2013-01-27 NOTE — H&P (Signed)
TOTAL KNEE ADMISSION H&P  Patient is being admitted for right total knee arthroplasty.  Subjective:  Chief Complaint:right knee pain.  HPI: Alison [name], 68 y.o. female, has a history of pain and functional disability in the right knee due to arthritis and has failed non-surgical conservative treatments for greater than 12 weeks to includeNSAID's and/or analgesics, corticosteriod injections and activity modification.  Onset of symptoms was gradual, starting 7 years ago with gradually worsening course since that time. The patient noted no past surgery on the right knee(s).  Patient currently rates pain in the right knee(s) at 6 out of 10 with activity. Patient has night pain, worsening of pain with activity and weight bearing, pain that interferes with activities of daily living, pain with passive range of motion, crepitus and joint swelling.  Patient has evidence of periarticular osteophytes and joint space narrowing by imaging studies. There is no active infection.  Patient Active Problem List   Diagnosis Date Noted  . Tobacco abuse 10/03/2012  . Hyperglycemia 10/03/2012  . Hyperlipidemia LDL goal < 100   . Unspecified vitamin D deficiency   . Obesity, unspecified   . Tobacco use disorder   . Pernicious anemia   . Unspecified glaucoma(365.9)   . Benign essential hypertension   . Osteoarthrosis, unspecified whether generalized or localized, lower leg   . Osteopenia    Past Medical History  Diagnosis Date  . Unspecified vitamin D deficiency   . Obesity, unspecified   . Goiter, specified as simple   . Pernicious anemia   . Hyperlipidemia LDL goal < 100   . Unspecified glaucoma   . Benign essential hypertension   . Diverticulitis of colon (without mention of hemorrhage)   . Osteoarthrosis, unspecified whether generalized or localized, lower leg   . Osteopenia   . Numbness     TOES RT FOOT  . Diabetes mellitus without complication   . Other specified disease of nail     FUNGUS  OF FINGERNAILS    Past Surgical History  Procedure Laterality Date  . Colectomy  2009     Current outpatient prescriptions: amLODipine (NORVASC) 10 MG tablet, Take 10 mg by mouth daily with breakfast. Take one tablet once daily for blood pressure and to protect the heart, Disp: , Rfl: ;   Calcium Carbonate-Vitamin D (CALTRATE 600+D PO), Take 1 tablet by mouth daily. , Disp: , Rfl: ;   celecoxib (CELEBREX) 200 MG capsule, Take 200 mg by mouth daily., Disp: , Rfl:  latanoprost (XALATAN) 0.005 % ophthalmic solution, Place 1 drop into both eyes at bedtime. , Disp: , Rfl: ;   metFORMIN (GLUCOPHAGE) 500 MG tablet, Take 250 mg by mouth daily with supper., Disp: , Rfl: ;   Multiple Vitamins-Minerals (CENTRUM PO), Take 1 tablet by mouth daily. , Disp: , Rfl: ;   Omega-3 Fatty Acids (FISH OIL BURP-LESS) 1000 MG CAPS, Take 1,000 mg by mouth daily., Disp: , Rfl:  pravastatin (PRAVACHOL) 80 MG tablet, Take 80 mg by mouth daily., Disp: , Rfl: ;   carboxymethylcellulose (REFRESH PLUS) 0.5 % SOLN, 1 drop 3 (three) times daily as needed., Disp: , Rfl:   No Known Allergies  History  Substance Use Topics  . Smoking status: Current Every Day Smoker -- 0.50 packs/day for 30 years  . Smokeless tobacco: Not on file  . Alcohol Use: No    Family History  Problem Relation Age of Onset  . Diabetes Sister      Review of Systems  Constitutional: Negative.  HENT: Negative.   Eyes: Positive for blurred vision. Negative for double vision, photophobia, pain, discharge and redness.  Respiratory: Negative.   Cardiovascular: Negative.   Gastrointestinal: Negative.   Genitourinary: Positive for frequency. Negative for dysuria, urgency, hematuria and flank pain.  Musculoskeletal: Positive for joint pain. Negative for back pain, falls, myalgias and neck pain.       Right knee pain  Skin: Negative.   Neurological: Negative.   Endo/Heme/Allergies: Negative.   Psychiatric/Behavioral: Negative.      Objective:  Physical Exam  Constitutional: She is oriented to person, place, and time. She appears well-developed and well-nourished. No distress.  HENT:  Head: Normocephalic and atraumatic.  Right Ear: External ear normal.  Left Ear: External ear normal.  Nose: Nose normal.  Mouth/Throat: Oropharynx is clear and moist.  Eyes: Conjunctivae and EOM are normal.  Neck: Normal range of motion. Neck supple.  Cardiovascular: Normal rate, regular rhythm, normal heart sounds and intact distal pulses.   No murmur heard. Respiratory: Effort normal and breath sounds normal. No respiratory distress. She has no wheezes.  GI: Soft. Bowel sounds are normal.  Musculoskeletal:       Right hip: Normal.       Left hip: Normal.       Right knee: She exhibits decreased range of motion and swelling. She exhibits no effusion and no erythema. Tenderness found. Medial joint line and lateral joint line tenderness noted.       Left knee: Normal.       Right lower leg: She exhibits no tenderness and no swelling.       Left lower leg: She exhibits no tenderness and no swelling.  Neurological: She is alert and oriented to person, place, and time. She has normal strength and normal reflexes. No sensory deficit.  Skin: No rash noted. She is not diaphoretic. No erythema.  Psychiatric: She has a normal mood and affect. Her behavior is normal.    Vitals Weight: 207 lb Height: 66.5 in Body Surface Area: 2.1 m Body Mass Index: 32.91 kg/m Pulse: 98 (Regular) BP: 142/71 (Sitting, Left Arm, Standard)  Imaging Review Plain radiographs demonstrate severe degenerative joint disease of the right knee(s). The overall alignment ismild varus. The bone quality appears to be good for age and reported activity level.  Assessment/Plan:  End stage arthritis, right knee   The patient history, physical examination, clinical judgment of the provider and imaging studies are consistent with end stage degenerative  joint disease of the right knee(s) and total knee arthroplasty is deemed medically necessary. The treatment options including medical management, injection therapy arthroscopy and arthroplasty were discussed at length. The risks and benefits of total knee arthroplasty were presented and reviewed. The risks due to aseptic loosening, infection, stiffness, patella tracking problems, thromboembolic complications and other imponderables were discussed. The patient acknowledged the explanation, agreed to proceed with the plan and consent was signed. Patient is being admitted for inpatient treatment for surgery, pain control, PT, OT, prophylactic antibiotics, VTE prophylaxis, progressive ambulation and ADL's and discharge planning. The patient is planning to be discharged to skilled nursing facility (would prefer [facility])      Ardeen Jourdain, [place]

## 2013-01-30 NOTE — Progress Notes (Signed)
Called office message left for Alison [name] to review LAB / CXR

## 2013-01-31 NOTE — Anesthesia Preprocedure Evaluation (Addendum)
Anesthesia Evaluation  Patient identified by MRN, date of birth, ID band Patient awake    Reviewed: Allergy & Precautions, H&P , NPO status , Patient's Chart, lab work & pertinent test results  Airway Mallampati: II TM Distance: >3 FB Neck ROM: full    Dental  (+) Edentulous Upper and Dental Advisory Given   Pulmonary neg pulmonary ROS, Current Smoker,  breath sounds clear to auscultation  Pulmonary exam normal       Cardiovascular Exercise Tolerance: Good hypertension, Pt. on medications Rhythm:regular Rate:Normal     Neuro/Psych negative neurological ROS  negative psych ROS   GI/Hepatic negative GI ROS, Neg liver ROS,   Endo/Other  diabetes, Well Controlled, Type 2, Oral Hypoglycemic Agents  Renal/GU negative Renal ROS  negative genitourinary   Musculoskeletal   Abdominal   Peds  Hematology negative hematology ROS (+) pernicous anemia   Anesthesia Other Findings   Reproductive/Obstetrics negative OB ROS                         Anesthesia Physical Anesthesia Plan  ASA: III  Anesthesia Plan: General   Post-op Pain Management:    Induction: Intravenous  Airway Management Planned: Oral ETT  Additional Equipment:   Intra-op Plan:   Post-operative Plan: Extubation in OR  Informed Consent: I have reviewed the patients History and Physical, chart, labs and discussed the procedure including the risks, benefits and alternatives for the proposed anesthesia with the patient or authorized representative who has indicated his/her understanding and acceptance.   Dental Advisory Given  Plan Discussed with: CRNA and Surgeon  Anesthesia Plan Comments:         Anesthesia Quick Evaluation

## 2013-01-31 NOTE — Progress Notes (Signed)
Paged Dr. Gladstone Lighter through office to review chest x-ray and labs. Call back number given.

## 2013-02-01 ENCOUNTER — Inpatient Hospital Stay (HOSPITAL_COMMUNITY): Payer: Medicare Other | Admitting: Anesthesiology

## 2013-02-01 ENCOUNTER — Encounter (HOSPITAL_COMMUNITY): Payer: Medicare Other | Admitting: Anesthesiology

## 2013-02-01 ENCOUNTER — Inpatient Hospital Stay (HOSPITAL_COMMUNITY)
Admission: RE | Admit: 2013-02-01 | Discharge: 2013-02-04 | DRG: 470 | Disposition: A | Discharge status: Skilled Nursing Facility | Payer: Medicare Other | Source: Ambulatory Visit | Attending: Orthopedic Surgery | Admitting: Orthopedic Surgery

## 2013-02-01 ENCOUNTER — Encounter (HOSPITAL_COMMUNITY)
Admission: RE | Disposition: A | Discharge status: Skilled Nursing Facility | Payer: Self-pay | Source: Ambulatory Visit | Attending: Orthopedic Surgery

## 2013-02-01 ENCOUNTER — Encounter (HOSPITAL_COMMUNITY): Payer: Self-pay | Admitting: *Deleted

## 2013-02-01 DIAGNOSIS — D649 Anemia, unspecified: Secondary | ICD-10-CM | POA: Diagnosis not present

## 2013-02-01 DIAGNOSIS — M21169 Varus deformity, not elsewhere classified, unspecified knee: Secondary | ICD-10-CM | POA: Diagnosis present

## 2013-02-01 DIAGNOSIS — S8990XA Unspecified injury of unspecified lower leg, initial encounter: Secondary | ICD-10-CM | POA: Diagnosis not present

## 2013-02-01 DIAGNOSIS — Z471 Aftercare following joint replacement surgery: Secondary | ICD-10-CM | POA: Diagnosis not present

## 2013-02-01 DIAGNOSIS — T380X5A Adverse effect of glucocorticoids and synthetic analogues, initial encounter: Secondary | ICD-10-CM | POA: Diagnosis present

## 2013-02-01 DIAGNOSIS — I10 Essential (primary) hypertension: Secondary | ICD-10-CM | POA: Diagnosis present

## 2013-02-01 DIAGNOSIS — M1711 Unilateral primary osteoarthritis, right knee: Secondary | ICD-10-CM | POA: Diagnosis present

## 2013-02-01 DIAGNOSIS — Z6833 Body mass index (BMI) 33.0-33.9, adult: Secondary | ICD-10-CM | POA: Diagnosis not present

## 2013-02-01 DIAGNOSIS — R279 Unspecified lack of coordination: Secondary | ICD-10-CM | POA: Diagnosis not present

## 2013-02-01 DIAGNOSIS — Z9049 Acquired absence of other specified parts of digestive tract: Secondary | ICD-10-CM

## 2013-02-01 DIAGNOSIS — M899 Disorder of bone, unspecified: Secondary | ICD-10-CM | POA: Diagnosis present

## 2013-02-01 DIAGNOSIS — E119 Type 2 diabetes mellitus without complications: Secondary | ICD-10-CM | POA: Diagnosis not present

## 2013-02-01 DIAGNOSIS — M1990 Unspecified osteoarthritis, unspecified site: Secondary | ICD-10-CM | POA: Diagnosis not present

## 2013-02-01 DIAGNOSIS — M25569 Pain in unspecified knee: Secondary | ICD-10-CM | POA: Diagnosis not present

## 2013-02-01 DIAGNOSIS — E559 Vitamin D deficiency, unspecified: Secondary | ICD-10-CM | POA: Diagnosis present

## 2013-02-01 DIAGNOSIS — K5732 Diverticulitis of large intestine without perforation or abscess without bleeding: Secondary | ICD-10-CM | POA: Diagnosis not present

## 2013-02-01 DIAGNOSIS — D62 Acute posthemorrhagic anemia: Secondary | ICD-10-CM | POA: Diagnosis not present

## 2013-02-01 DIAGNOSIS — M1710 Unilateral primary osteoarthritis, unspecified knee: Secondary | ICD-10-CM | POA: Diagnosis not present

## 2013-02-01 DIAGNOSIS — E040 Nontoxic diffuse goiter: Secondary | ICD-10-CM | POA: Diagnosis not present

## 2013-02-01 DIAGNOSIS — F17200 Nicotine dependence, unspecified, uncomplicated: Secondary | ICD-10-CM | POA: Diagnosis present

## 2013-02-01 DIAGNOSIS — I700 Atherosclerosis of aorta: Secondary | ICD-10-CM | POA: Diagnosis not present

## 2013-02-01 DIAGNOSIS — E785 Hyperlipidemia, unspecified: Secondary | ICD-10-CM | POA: Diagnosis present

## 2013-02-01 DIAGNOSIS — H409 Unspecified glaucoma: Secondary | ICD-10-CM | POA: Diagnosis present

## 2013-02-01 DIAGNOSIS — E669 Obesity, unspecified: Secondary | ICD-10-CM | POA: Diagnosis present

## 2013-02-01 DIAGNOSIS — Z833 Family history of diabetes mellitus: Secondary | ICD-10-CM | POA: Diagnosis not present

## 2013-02-01 DIAGNOSIS — Z96659 Presence of unspecified artificial knee joint: Secondary | ICD-10-CM

## 2013-02-01 DIAGNOSIS — M949 Disorder of cartilage, unspecified: Secondary | ICD-10-CM | POA: Diagnosis present

## 2013-02-01 DIAGNOSIS — IMO0002 Reserved for concepts with insufficient information to code with codable children: Secondary | ICD-10-CM | POA: Diagnosis not present

## 2013-02-01 DIAGNOSIS — R269 Unspecified abnormalities of gait and mobility: Secondary | ICD-10-CM | POA: Diagnosis not present

## 2013-02-01 DIAGNOSIS — Z01818 Encounter for other preprocedural examination: Secondary | ICD-10-CM | POA: Diagnosis not present

## 2013-02-01 HISTORY — PX: TOTAL KNEE ARTHROPLASTY: SHX125

## 2013-02-01 LAB — CBC WITH DIFFERENTIAL/PLATELET
BASOS ABS: 0 10*3/uL (ref 0.0–0.1)
BASOS PCT: 0 % (ref 0–1)
EOS ABS: 0.2 10*3/uL (ref 0.0–0.7)
EOS PCT: 2 % (ref 0–5)
HCT: 40.7 % (ref 36.0–46.0)
Hemoglobin: 13.8 g/dL (ref 12.0–15.0)
LYMPHS PCT: 23 % (ref 12–46)
Lymphs Abs: 3.1 10*3/uL (ref 0.7–4.0)
MCH: 29.4 pg (ref 26.0–34.0)
MCHC: 33.9 g/dL (ref 30.0–36.0)
MCV: 86.6 fL (ref 78.0–100.0)
Monocytes Absolute: 1 10*3/uL (ref 0.1–1.0)
Monocytes Relative: 8 % (ref 3–12)
Neutro Abs: 9 10*3/uL — ABNORMAL HIGH (ref 1.7–7.7)
Neutrophils Relative %: 67 % (ref 43–77)
Platelets: 274 10*3/uL (ref 150–400)
RBC: 4.7 MIL/uL (ref 3.87–5.11)
RDW: 14 % (ref 11.5–15.5)
WBC: 13.4 10*3/uL — ABNORMAL HIGH (ref 4.0–10.5)

## 2013-02-01 LAB — GLUCOSE, CAPILLARY
GLUCOSE-CAPILLARY: 176 mg/dL — AB (ref 70–99)
Glucose-Capillary: 102 mg/dL — ABNORMAL HIGH (ref 70–99)
Glucose-Capillary: 137 mg/dL — ABNORMAL HIGH (ref 70–99)
Glucose-Capillary: 151 mg/dL — ABNORMAL HIGH (ref 70–99)

## 2013-02-01 LAB — TYPE AND SCREEN
ABO/RH(D): B POS
Antibody Screen: NEGATIVE

## 2013-02-01 LAB — ABO/RH: ABO/RH(D): B POS

## 2013-02-01 SURGERY — ARTHROPLASTY, KNEE, TOTAL
Anesthesia: General | Site: Knee | Laterality: Right

## 2013-02-01 MED ORDER — AMLODIPINE BESYLATE 10 MG PO TABS
10.0000 mg | ORAL_TABLET | Freq: Every day | ORAL | Status: DC
  Administered 2013-02-03 – 2013-02-04 (×2): 10 mg via ORAL
  Filled 2013-02-01 (×3): qty 1

## 2013-02-01 MED ORDER — HYDROMORPHONE HCL PF 1 MG/ML IJ SOLN
1.0000 mg | INTRAMUSCULAR | Status: DC | PRN
  Administered 2013-02-01: 0.5 mg via INTRAVENOUS
  Filled 2013-02-01: qty 1

## 2013-02-01 MED ORDER — GLYCOPYRROLATE 0.2 MG/ML IJ SOLN
INTRAMUSCULAR | Status: DC | PRN
  Administered 2013-02-01: .6 mg via INTRAVENOUS

## 2013-02-01 MED ORDER — SUCCINYLCHOLINE CHLORIDE 20 MG/ML IJ SOLN
INTRAMUSCULAR | Status: DC | PRN
  Administered 2013-02-01: 100 mg via INTRAVENOUS

## 2013-02-01 MED ORDER — PROPOFOL 10 MG/ML IV BOLUS
INTRAVENOUS | Status: DC | PRN
Start: 2013-02-01 — End: 2013-02-01
  Administered 2013-02-01: 150 mg via INTRAVENOUS

## 2013-02-01 MED ORDER — CARBOXYMETHYLCELLULOSE SODIUM 0.5 % OP SOLN: 1.0000 [drp] | Freq: Three times a day (TID) | OPHTHALMIC | Status: DC | PRN

## 2013-02-01 MED ORDER — DEXAMETHASONE SODIUM PHOSPHATE 10 MG/ML IJ SOLN
INTRAMUSCULAR | Status: AC
  Filled 2013-02-01: qty 1

## 2013-02-01 MED ORDER — CHLORHEXIDINE GLUCONATE 4 % EX LIQD: 60.0000 mL | Freq: Once | CUTANEOUS | Status: DC

## 2013-02-01 MED ORDER — HYDROMORPHONE HCL PF 1 MG/ML IJ SOLN
0.2500 mg | INTRAMUSCULAR | Status: DC | PRN
  Administered 2013-02-01 (×4): 0.5 mg via INTRAVENOUS

## 2013-02-01 MED ORDER — METOPROLOL TARTRATE 1 MG/ML IV SOLN
INTRAVENOUS | Status: AC
  Filled 2013-02-01: qty 5

## 2013-02-01 MED ORDER — FENTANYL CITRATE 0.05 MG/ML IJ SOLN
INTRAMUSCULAR | Status: AC
  Filled 2013-02-01: qty 2

## 2013-02-01 MED ORDER — SIMVASTATIN 5 MG PO TABS
5.0000 mg | ORAL_TABLET | Freq: Every day | ORAL | Status: DC
  Administered 2013-02-02 – 2013-02-03 (×2): 5 mg via ORAL
  Filled 2013-02-01 (×3): qty 1

## 2013-02-01 MED ORDER — ACETAMINOPHEN 10 MG/ML IV SOLN
1000.0000 mg | Freq: Once | INTRAVENOUS | Status: AC
  Administered 2013-02-01: 1000 mg via INTRAVENOUS
  Filled 2013-02-01: qty 100

## 2013-02-01 MED ORDER — ONDANSETRON HCL 4 MG/2ML IJ SOLN
4.0000 mg | Freq: Four times a day (QID) | INTRAMUSCULAR | Status: DC | PRN
  Administered 2013-02-01: 4 mg via INTRAVENOUS
  Filled 2013-02-01: qty 2

## 2013-02-01 MED ORDER — HYDROMORPHONE HCL PF 1 MG/ML IJ SOLN
INTRAMUSCULAR | Status: AC
  Filled 2013-02-01: qty 1

## 2013-02-01 MED ORDER — SODIUM CHLORIDE 0.9 % IR SOLN
Status: DC | PRN
  Administered 2013-02-01: 09:00:00

## 2013-02-01 MED ORDER — HYDROMORPHONE HCL PF 1 MG/ML IJ SOLN
0.2500 mg | INTRAMUSCULAR | Status: DC | PRN
  Administered 2013-02-01 (×2): 0.5 mg via INTRAVENOUS

## 2013-02-01 MED ORDER — SODIUM CHLORIDE 0.9 % IJ SOLN
INTRAMUSCULAR | Status: AC
  Filled 2013-02-01: qty 50

## 2013-02-01 MED ORDER — SODIUM CHLORIDE 0.9 % IJ SOLN
INTRAMUSCULAR | Status: DC | PRN
  Administered 2013-02-01: 20 mL via INTRAVENOUS

## 2013-02-01 MED ORDER — METFORMIN HCL 500 MG PO TABS
250.0000 mg | ORAL_TABLET | Freq: Every day | ORAL | Status: DC
  Administered 2013-02-02 – 2013-02-03 (×2): 250 mg via ORAL
  Filled 2013-02-01 (×3): qty 1

## 2013-02-01 MED ORDER — BUPIVACAINE LIPOSOME 1.3 % IJ SUSP
INTRAMUSCULAR | Status: DC | PRN
  Administered 2013-02-01: 20 mL

## 2013-02-01 MED ORDER — ONDANSETRON HCL 4 MG/2ML IJ SOLN
INTRAMUSCULAR | Status: AC
Start: 2013-02-01 — End: 2013-02-01
  Filled 2013-02-01: qty 2

## 2013-02-01 MED ORDER — HYDROCODONE-ACETAMINOPHEN 5-325 MG PO TABS
1.0000 | ORAL_TABLET | ORAL | Status: DC | PRN
  Administered 2013-02-01 (×2): 1 via ORAL
  Administered 2013-02-02 – 2013-02-04 (×6): 2 via ORAL
  Filled 2013-02-01 (×6): qty 2
  Filled 2013-02-01 (×2): qty 1
  Filled 2013-02-01: qty 2

## 2013-02-01 MED ORDER — OXYCODONE-ACETAMINOPHEN 5-325 MG PO TABS: 2.0000 | ORAL_TABLET | ORAL | Status: DC | PRN

## 2013-02-01 MED ORDER — BUPIVACAINE LIPOSOME 1.3 % IJ SUSP
20.0000 mL | Freq: Once | INTRAMUSCULAR | Status: DC
Start: 2013-02-01 — End: 2013-02-01
  Filled 2013-02-01: qty 20

## 2013-02-01 MED ORDER — NEOSTIGMINE METHYLSULFATE 1 MG/ML IJ SOLN
INTRAMUSCULAR | Status: AC
  Filled 2013-02-01: qty 10

## 2013-02-01 MED ORDER — FERROUS SULFATE 325 (65 FE) MG PO TABS
325.0000 mg | ORAL_TABLET | Freq: Three times a day (TID) | ORAL | Status: DC
  Administered 2013-02-02 – 2013-02-04 (×7): 325 mg via ORAL
  Filled 2013-02-01 (×10): qty 1

## 2013-02-01 MED ORDER — PROPOFOL 10 MG/ML IV BOLUS
INTRAVENOUS | Status: AC
  Filled 2013-02-01: qty 20

## 2013-02-01 MED ORDER — CEFAZOLIN SODIUM 1-5 GM-% IV SOLN
1.0000 g | Freq: Four times a day (QID) | INTRAVENOUS | Status: AC
  Administered 2013-02-01 (×2): 1 g via INTRAVENOUS
  Filled 2013-02-01 (×2): qty 50

## 2013-02-01 MED ORDER — FENTANYL CITRATE 0.05 MG/ML IJ SOLN
INTRAMUSCULAR | Status: AC
  Filled 2013-02-01: qty 5

## 2013-02-01 MED ORDER — SODIUM CHLORIDE 0.9 % IR SOLN
Status: DC | PRN
  Administered 2013-02-01: 1000 mL

## 2013-02-01 MED ORDER — FENTANYL CITRATE 0.05 MG/ML IJ SOLN
INTRAMUSCULAR | Status: DC | PRN
  Administered 2013-02-01: 50 ug via INTRAVENOUS
  Administered 2013-02-01: 100 ug via INTRAVENOUS
  Administered 2013-02-01 (×4): 50 ug via INTRAVENOUS

## 2013-02-01 MED ORDER — GLYCOPYRROLATE 0.2 MG/ML IJ SOLN
INTRAMUSCULAR | Status: AC
Start: 2013-02-01 — End: 2013-02-01
  Filled 2013-02-01: qty 3

## 2013-02-01 MED ORDER — ONDANSETRON HCL 4 MG PO TABS: 4.0000 mg | ORAL_TABLET | Freq: Four times a day (QID) | ORAL | Status: DC | PRN

## 2013-02-01 MED ORDER — BISACODYL 10 MG RE SUPP: 10.0000 mg | Freq: Every day | RECTAL | Status: DC | PRN

## 2013-02-01 MED ORDER — INSULIN ASPART 100 UNIT/ML ~~LOC~~ SOLN: 0.0000 [IU] | Freq: Three times a day (TID) | SUBCUTANEOUS | Status: DC

## 2013-02-01 MED ORDER — POLYVINYL ALCOHOL 1.4 % OP SOLN
1.0000 [drp] | Freq: Three times a day (TID) | OPHTHALMIC | Status: DC | PRN
  Filled 2013-02-01: qty 15

## 2013-02-01 MED ORDER — ONDANSETRON HCL 4 MG/2ML IJ SOLN
INTRAMUSCULAR | Status: DC | PRN
  Administered 2013-02-01: 4 mg via INTRAVENOUS

## 2013-02-01 MED ORDER — MENTHOL 3 MG MT LOZG
1.0000 | LOZENGE | OROMUCOSAL | Status: DC | PRN
  Filled 2013-02-01: qty 9

## 2013-02-01 MED ORDER — THROMBIN 5000 UNITS EX SOLR
CUTANEOUS | Status: AC
  Filled 2013-02-01: qty 5000

## 2013-02-01 MED ORDER — FLEET ENEMA 7-19 GM/118ML RE ENEM
1.0000 | ENEMA | Freq: Once | RECTAL | Status: AC | PRN
Start: 2013-02-01 — End: 2013-02-01

## 2013-02-01 MED ORDER — RIVAROXABAN 10 MG PO TABS
10.0000 mg | ORAL_TABLET | Freq: Every day | ORAL | Status: DC
  Administered 2013-02-02 – 2013-02-04 (×3): 10 mg via ORAL
  Filled 2013-02-01 (×5): qty 1

## 2013-02-01 MED ORDER — CEFAZOLIN SODIUM-DEXTROSE 2-3 GM-% IV SOLR
INTRAVENOUS | Status: AC
  Filled 2013-02-01: qty 50

## 2013-02-01 MED ORDER — ALUM & MAG HYDROXIDE-SIMETH 200-200-20 MG/5ML PO SUSP: 30.0000 mL | ORAL | Status: DC | PRN

## 2013-02-01 MED ORDER — DEXTROSE 5 % IV SOLN
500.0000 mg | Freq: Four times a day (QID) | INTRAVENOUS | Status: DC | PRN
  Administered 2013-02-01: 500 mg via INTRAVENOUS
  Filled 2013-02-01: qty 5

## 2013-02-01 MED ORDER — METHOCARBAMOL 500 MG PO TABS
500.0000 mg | ORAL_TABLET | Freq: Four times a day (QID) | ORAL | Status: DC | PRN
  Administered 2013-02-02 – 2013-02-03 (×4): 500 mg via ORAL
  Filled 2013-02-01 (×4): qty 1

## 2013-02-01 MED ORDER — ROCURONIUM BROMIDE 100 MG/10ML IV SOLN
INTRAVENOUS | Status: DC | PRN
  Administered 2013-02-01 (×2): 10 mg via INTRAVENOUS

## 2013-02-01 MED ORDER — CELECOXIB 200 MG PO CAPS
200.0000 mg | ORAL_CAPSULE | Freq: Two times a day (BID) | ORAL | Status: DC
  Administered 2013-02-01 – 2013-02-04 (×6): 200 mg via ORAL
  Filled 2013-02-01 (×7): qty 1

## 2013-02-01 MED ORDER — POLYETHYLENE GLYCOL 3350 17 G PO PACK: 17.0000 g | PACK | Freq: Every day | ORAL | Status: DC | PRN

## 2013-02-01 MED ORDER — NEOSTIGMINE METHYLSULFATE 1 MG/ML IJ SOLN
INTRAMUSCULAR | Status: DC | PRN
Start: 2013-02-01 — End: 2013-02-01
  Administered 2013-02-01: 4 mg via INTRAVENOUS

## 2013-02-01 MED ORDER — LACTATED RINGERS IV SOLN
INTRAVENOUS | Status: DC
  Administered 2013-02-01 (×2): via INTRAVENOUS

## 2013-02-01 MED ORDER — METOPROLOL TARTRATE 1 MG/ML IV SOLN
INTRAVENOUS | Status: DC | PRN
  Administered 2013-02-01 (×3): 5 mg via INTRAVENOUS

## 2013-02-01 MED ORDER — HYDRALAZINE HCL 20 MG/ML IJ SOLN
INTRAMUSCULAR | Status: AC
  Filled 2013-02-01: qty 1

## 2013-02-01 MED ORDER — CEFAZOLIN SODIUM-DEXTROSE 2-3 GM-% IV SOLR
2.0000 g | INTRAVENOUS | Status: AC
  Administered 2013-02-01: 2 g via INTRAVENOUS

## 2013-02-01 MED ORDER — LACTATED RINGERS IV SOLN: INTRAVENOUS | Status: DC

## 2013-02-01 MED ORDER — LATANOPROST 0.005 % OP SOLN
1.0000 [drp] | Freq: Every day | OPHTHALMIC | Status: DC
  Administered 2013-02-01 – 2013-02-03 (×3): 1 [drp] via OPHTHALMIC
  Filled 2013-02-01: qty 2.5

## 2013-02-01 MED ORDER — LACTATED RINGERS IV SOLN
INTRAVENOUS | Status: DC
  Administered 2013-02-01 – 2013-02-02 (×3): via INTRAVENOUS

## 2013-02-01 MED ORDER — ACETAMINOPHEN 650 MG RE SUPP: 650.0000 mg | Freq: Four times a day (QID) | RECTAL | Status: DC | PRN

## 2013-02-01 MED ORDER — ACETAMINOPHEN 325 MG PO TABS: 650.0000 mg | ORAL_TABLET | Freq: Four times a day (QID) | ORAL | Status: DC | PRN

## 2013-02-01 MED ORDER — DEXAMETHASONE SODIUM PHOSPHATE 10 MG/ML IJ SOLN
INTRAMUSCULAR | Status: DC | PRN
  Administered 2013-02-01: 10 mg via INTRAVENOUS

## 2013-02-01 MED ORDER — PHENOL 1.4 % MT LIQD: 1.0000 | OROMUCOSAL | Status: DC | PRN

## 2013-02-01 MED ORDER — THROMBIN 5000 UNITS EX SOLR
CUTANEOUS | Status: DC | PRN
  Administered 2013-02-01: 5000 [IU] via TOPICAL

## 2013-02-01 SURGICAL SUPPLY — 65 items
BAG ZIPLOCK 12X15 (MISCELLANEOUS) ×3 IMPLANT
BANDAGE ELASTIC 4 VELCRO ST LF (GAUZE/BANDAGES/DRESSINGS) ×3 IMPLANT
BANDAGE ELASTIC 6 VELCRO ST LF (GAUZE/BANDAGES/DRESSINGS) ×3 IMPLANT
BANDAGE ESMARK 6X9 LF (GAUZE/BANDAGES/DRESSINGS) ×1 IMPLANT
BLADE SAG 18X100X1.27 (BLADE) ×3 IMPLANT
BLADE SAW SGTL 11.0X1.19X90.0M (BLADE) ×3 IMPLANT
BNDG ESMARK 6X9 LF (GAUZE/BANDAGES/DRESSINGS) ×3
BONE CEMENT GENTAMICIN (Cement) ×6 IMPLANT
CAPT RP KNEE ×3 IMPLANT
CEMENT BONE GENTAMICIN 40 (Cement) ×2 IMPLANT
CUFF TOURN SGL QUICK 34 (TOURNIQUET CUFF) ×2
CUFF TRNQT CYL 34X4X40X1 (TOURNIQUET CUFF) ×1 IMPLANT
DERMABOND ADVANCED (GAUZE/BANDAGES/DRESSINGS) ×2
DERMABOND ADVANCED .7 DNX12 (GAUZE/BANDAGES/DRESSINGS) ×1 IMPLANT
DRAPE EXTREMITY T 121X128X90 (DRAPE) ×3 IMPLANT
DRAPE INCISE IOBAN 66X45 STRL (DRAPES) IMPLANT
DRAPE LG THREE QUARTER DISP (DRAPES) ×3 IMPLANT
DRAPE POUCH INSTRU U-SHP 10X18 (DRAPES) ×3 IMPLANT
DRAPE U-SHAPE 47X51 STRL (DRAPES) ×3 IMPLANT
DRSG AQUACEL AG ADV 3.5X10 (GAUZE/BANDAGES/DRESSINGS) ×3 IMPLANT
DRSG TEGADERM 4X4.75 (GAUZE/BANDAGES/DRESSINGS) ×3 IMPLANT
DURAPREP 26ML APPLICATOR (WOUND CARE) ×3 IMPLANT
ELECT REM PT RETURN 9FT ADLT (ELECTROSURGICAL) ×3
ELECTRODE REM PT RTRN 9FT ADLT (ELECTROSURGICAL) ×1 IMPLANT
EVACUATOR 1/8 PVC DRAIN (DRAIN) ×3 IMPLANT
FACESHIELD LNG OPTICON STERILE (SAFETY) ×21 IMPLANT
GAUZE SPONGE 2X2 8PLY STRL LF (GAUZE/BANDAGES/DRESSINGS) ×1 IMPLANT
GLOVE BIOGEL PI IND STRL 7.0 (GLOVE) ×1 IMPLANT
GLOVE BIOGEL PI IND STRL 8 (GLOVE) ×1 IMPLANT
GLOVE BIOGEL PI INDICATOR 7.0 (GLOVE) ×2
GLOVE BIOGEL PI INDICATOR 8 (GLOVE) ×2
GLOVE ECLIPSE 6.5 STRL STRAW (GLOVE) ×3 IMPLANT
GLOVE ECLIPSE 8.0 STRL XLNG CF (GLOVE) ×6 IMPLANT
GLOVE SURG SS PI 6.5 STRL IVOR (GLOVE) ×6 IMPLANT
GOWN STRL REUS W/TWL LRG LVL3 (GOWN DISPOSABLE) ×9 IMPLANT
GOWN STRL REUS W/TWL XL LVL3 (GOWN DISPOSABLE) ×3 IMPLANT
HANDPIECE INTERPULSE COAX TIP (DISPOSABLE) ×2
IMMOBILIZER KNEE 20 (SOFTGOODS) ×3 IMPLANT
KIT BASIN OR (CUSTOM PROCEDURE TRAY) ×3 IMPLANT
MANIFOLD NEPTUNE II (INSTRUMENTS) ×3 IMPLANT
NEEDLE HYPO 22GX1.5 SAFETY (NEEDLE) ×3 IMPLANT
NS IRRIG 1000ML POUR BTL (IV SOLUTION) IMPLANT
PACK TOTAL JOINT (CUSTOM PROCEDURE TRAY) ×3 IMPLANT
PAD ABD 8X10 STRL (GAUZE/BANDAGES/DRESSINGS) IMPLANT
PADDING CAST COTTON 6X4 STRL (CAST SUPPLIES) IMPLANT
POSITIONER SURGICAL ARM (MISCELLANEOUS) ×3 IMPLANT
SET HNDPC FAN SPRY TIP SCT (DISPOSABLE) ×1 IMPLANT
SPONGE GAUZE 2X2 STER 10/PKG (GAUZE/BANDAGES/DRESSINGS) ×2
SPONGE LAP 18X18 X RAY DECT (DISPOSABLE) IMPLANT
SPONGE SURGIFOAM ABS GEL 100 (HEMOSTASIS) ×3 IMPLANT
STAPLER VISISTAT 35W (STAPLE) IMPLANT
SUCTION FRAZIER 12FR DISP (SUCTIONS) ×3 IMPLANT
SUT BONE WAX W31G (SUTURE) ×3 IMPLANT
SUT MNCRL AB 4-0 PS2 18 (SUTURE) ×3 IMPLANT
SUT VIC AB 1 CT1 27 (SUTURE) ×4
SUT VIC AB 1 CT1 27XBRD ANTBC (SUTURE) ×2 IMPLANT
SUT VIC AB 2-0 CT1 27 (SUTURE) ×4
SUT VIC AB 2-0 CT1 TAPERPNT 27 (SUTURE) ×2 IMPLANT
SUT VLOC 180 0 24IN GS25 (SUTURE) ×3 IMPLANT
SYR 20CC LL (SYRINGE) ×6 IMPLANT
TOWEL OR 17X26 10 PK STRL BLUE (TOWEL DISPOSABLE) ×6 IMPLANT
TOWER CARTRIDGE SMART MIX (DISPOSABLE) ×3 IMPLANT
TRAY FOLEY CATH 14FRSI W/METER (CATHETERS) ×3 IMPLANT
WATER STERILE IRR 1500ML POUR (IV SOLUTION) ×3 IMPLANT
WRAP KNEE MAXI GEL POST OP (GAUZE/BANDAGES/DRESSINGS) ×3 IMPLANT

## 2013-02-01 NOTE — Transfer of Care (Signed)
Immediate Anesthesia Transfer of Care Note  Patient: Alison [name]  Procedure(s) Performed: Procedure(s): RIGHT TOTAL KNEE ARTHROPLASTY (Right)  Patient Location: PACU  Anesthesia Type:General  Level of Consciousness: awake, alert  and patient cooperative  Airway & Oxygen Therapy: Patient Spontanous Breathing and Patient connected to face mask oxygen  Post-op Assessment: Report given to PACU RN and Post -op Vital signs reviewed and stable  Post vital signs: Reviewed and stable  Complications: No apparent anesthesia complications

## 2013-02-01 NOTE — Plan of Care (Signed)
Problem: Consults Goal: Diagnosis- Total Joint Replacement Right total knee     

## 2013-02-01 NOTE — Brief Op Note (Signed)
02/01/2013  10:22 AM  PATIENT:  Alison [name]  68 y.o. female  PRE-OPERATIVE DIAGNOSIS:  OA RIGHT KNEE and Flexion Contracture   POST-OPERATIVE DIAGNOSIS:  OA RIGHT KNEE and Flexion Contracture  PROCEDURE:  Procedure(s): RIGHT TOTAL KNEE ARTHROPLASTY (Right) and Release of Flexion Contractures.  SURGEON:  Surgeon(s) and Role:    * Tobi Bastos, MD - Primary  PHYSICIAN ASSISTANT: Ardeen Jourdain PA  ASSISTANTS: Ardeen Jourdain PA  ANESTHESIA:   general  EBL:  Total I/O In: 1000 [I.V.:1000] Out: 250 [Urine:250]  BLOOD ADMINISTERED:none  DRAINS: (one) Hemovact drain(s) in the Right Knee with  Suction Open   LOCAL MEDICATIONS USED:  BUPIVICAINE 20cc mixed with 20cc of Normal Saline  SPECIMEN:  No Specimen  DISPOSITION OF SPECIMEN:  N/A  COUNTS:  YES  TOURNIQUET:  * Missing tourniquet times found for documented tourniquets in log:  [id number] *  DICTATION: .Other Dictation: Dictation Number [id number]  PLAN OF CARE: Admit to inpatient   PATIENT DISPOSITION:  Stable in OR   Delay start of Pharmacological VTE agent (>24hrs) due to surgical blood loss or risk of bleeding: yes

## 2013-02-01 NOTE — Progress Notes (Signed)
UR completed 

## 2013-02-01 NOTE — Anesthesia Postprocedure Evaluation (Signed)
  Anesthesia Post-op Note  Patient: Alison [name]  Procedure(s) Performed: Procedure(s) (LRB): RIGHT TOTAL KNEE ARTHROPLASTY (Right)  Patient Location: PACU  Anesthesia Type: General  Level of Consciousness: awake and alert   Airway and Oxygen Therapy: Patient Spontanous Breathing  Post-op Pain: mild  Post-op Assessment: Post-op Vital signs reviewed, Patient's Cardiovascular Status Stable, Respiratory Function Stable, Patent Airway and No signs of Nausea or vomiting  Last Vitals:  Filed Vitals:   02/01/13 1200  BP: 139/79  Pulse: 81  Temp: 36.8 C  Resp: 11    Post-op Vital Signs: stable   Complications: No apparent anesthesia complications

## 2013-02-01 NOTE — Interval H&P Note (Signed)
History and Physical Interval Note:  02/01/2013 8:22 AM  Alison [name]  has presented today for surgery, with the diagnosis of OA RIGHT KNEE   The various methods of treatment have been discussed with the patient and family. After consideration of risks, benefits and other options for treatment, the patient has consented to  Procedure(s): RIGHT TOTAL KNEE ARTHROPLASTY (Right) as a surgical intervention .  The patient's history has been reviewed, patient examined, no change in status, stable for surgery.  I have reviewed the patient's chart and labs.  Questions were answered to the patient's satisfaction.     Foster Frericks A

## 2013-02-01 NOTE — Preoperative (Signed)
Beta Blockers   Reason not to administer Beta Blockers:Not Applicable 

## 2013-02-02 ENCOUNTER — Inpatient Hospital Stay (HOSPITAL_COMMUNITY): Payer: Medicare Other

## 2013-02-02 DIAGNOSIS — I700 Atherosclerosis of aorta: Secondary | ICD-10-CM | POA: Diagnosis not present

## 2013-02-02 DIAGNOSIS — Z01818 Encounter for other preprocedural examination: Secondary | ICD-10-CM | POA: Diagnosis not present

## 2013-02-02 LAB — GLUCOSE, CAPILLARY
GLUCOSE-CAPILLARY: 119 mg/dL — AB (ref 70–99)
Glucose-Capillary: 124 mg/dL — ABNORMAL HIGH (ref 70–99)
Glucose-Capillary: 126 mg/dL — ABNORMAL HIGH (ref 70–99)
Glucose-Capillary: 130 mg/dL — ABNORMAL HIGH (ref 70–99)

## 2013-02-02 LAB — BASIC METABOLIC PANEL
BUN: 10 mg/dL (ref 6–23)
CHLORIDE: 101 meq/L (ref 96–112)
CO2: 25 mEq/L (ref 19–32)
CREATININE: 0.65 mg/dL (ref 0.50–1.10)
Calcium: 8.6 mg/dL (ref 8.4–10.5)
GFR calc non Af Amer: 89 mL/min — ABNORMAL LOW (ref >90)
GLUCOSE: 132 mg/dL — AB (ref 70–99)
Potassium: 3.8 mEq/L (ref 3.7–5.3)
Sodium: 138 mEq/L (ref 137–147)

## 2013-02-02 LAB — CBC
HEMATOCRIT: 31.4 % — AB (ref 36.0–46.0)
Hemoglobin: 10.3 g/dL — ABNORMAL LOW (ref 12.0–15.0)
MCH: 28.4 pg (ref 26.0–34.0)
MCHC: 32.8 g/dL (ref 30.0–36.0)
MCV: 86.5 fL (ref 78.0–100.0)
Platelets: 238 10*3/uL (ref 150–400)
RBC: 3.63 MIL/uL — ABNORMAL LOW (ref 3.87–5.11)
RDW: 14.2 % (ref 11.5–15.5)
WBC: 15.6 10*3/uL — ABNORMAL HIGH (ref 4.0–10.5)

## 2013-02-02 NOTE — Discharge Instructions (Addendum)
Walk with your walker until PT transitions you to the cane Weight bearing as tolerated Shower only, no tub bath. Do not change the dressing over the incision unless there is drainage Call if any temperatures greater than 101 or any wound complications: [phone] during the day and ask for Dr. Charlestine Night nurse, Brunilda Payor. Discontinue all vitamins and supplements until completion of 3 week course of Xarelto   Information on my medicine - XARELTO (Rivaroxaban)  This medication education was reviewed with me or my healthcare representative as part of my discharge preparation.  The pharmacist that spoke with me during my hospital stay was:  Rudean Haskell, [facility]  Why was Xarelto prescribed for you? Xarelto was prescribed for you to reduce the risk of a blood clots forming after orthopedic surgery OR to reduce the risk of forming blood clots that cause a stroke if you have a medical condition called atrial fibrillation (a type of irregular heartbeat).  What do you need to know about xarelto ? Take your Xarelto ONCE DAILY at the same time every day with your evening meal. If you have difficulty swallowing the tablet whole, you may crush it and mix in applesauce just prior to taking your dose.  Take Xarelto exactly as prescribed by your doctor and DO NOT stop taking Xarelto without talking to the doctor who prescribed the medication.  Stopping without other stroke or VTE prevention medication to take the place of Xarelto may increase your risk of developing a new clot or stroke.  Refill your prescription before you run out.  After discharge, you should have regular check-up appointments with your healthcare provider that is prescribing your Xarelto.  In the future your dose may need to be changed if your kidney function or weight changes by a significant amount.  What do you do if you miss a dose? If you are taking Xarelto ONCE DAILY and you miss a dose, take it as soon as you remember on  the same day then continue your regularly scheduled once daily regimen the next day. Do not take two doses of Xarelto at the same time.   Important Safety Information A possible side effect of Xarelto is bleeding. You should call your healthcare provider right away if you experience any of the following:   Bleeding from an injury or your nose that does not stop.   Unusual colored urine (red or dark brown) or unusual colored stools (red or black).   Unusual bruising for unknown reasons.   A serious fall or if you hit your head (even if there is no bleeding).  Some medicines may interact with Xarelto and might increase your risk of bleeding while on Xarelto. To help avoid this, consult your healthcare provider or pharmacist prior to using any new prescription or non-prescription medications, including herbals, vitamins, non-steroidal anti-inflammatory drugs (NSAIDs) and supplements.  This website has more information on Xarelto: [email or web address]

## 2013-02-02 NOTE — Progress Notes (Signed)
Clinical Social Work Department CLINICAL SOCIAL WORK PLACEMENT NOTE 02/02/2013  Patient:  [facility]  Account Number:  [id number] [place] date:  02/01/2013  Clinical Social Worker:  Werner Lean, LCSW  Date/time:  02/02/2013 01:38 PM  Clinical Social Work is seeking post-discharge placement for this patient at the following level of care:   SKILLED NURSING   (*CSW will update this form in Epic as items are completed)     Patient/family provided with [place] Department of Clinical Social Work's list of facilities offering this level of care within the geographic area requested by the patient (or if unable, by the patient's family).  02/02/2013  Patient/family informed of their freedom to choose among providers that offer the needed level of care, that participate in Medicare, Medicaid or managed care program needed by the patient, have an available bed and are willing to accept the patient.    Patient/family informed of [facility]' ownership interest in [facility], as well as of the fact that they are under no obligation to receive care at this facility.  PASARR submitted to EDS on 02/02/2013 PASARR number received from EDS on 02/02/2013  FL2 transmitted to all facilities in geographic area requested by pt/family on  02/02/2013 FL2 transmitted to all facilities within larger geographic area on   Patient informed that his/her managed care company has contracts with or will negotiate with  certain facilities, including the following:     Patient/family informed of bed offers received:   Patient chooses bed at  Physician recommends and patient chooses bed at    Patient to be transferred to  on   Patient to be transferred to facility by   The following physician request were entered in Epic:   Additional Comments:  Werner Lean LCSW [phone]

## 2013-02-02 NOTE — Op Note (Signed)
NAMECEDRICA, BRUNE              ACCOUNT NO.:  [id number]  MEDICAL RECORD NO.:  [id number]  LOCATION:  79                         FACILITY:  [facility]  PHYSICIAN:  Kipp Brood. Shaletha Humble, M.D.DATE OF BIRTH:  02/23/1945  DATE OF PROCEDURE:  02/01/2013 DATE OF DISCHARGE:                              OPERATIVE REPORT   PREOPERATIVE DIAGNOSIS: 1. 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 2. 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  POSTOPERATIVE DIAGNOSIS: 1. 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 2. 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  SURGEON:  Kipp Brood. Gladstone Lighter, M.D.  ASSISTANT:  Ardeen Jourdain, [place].  OPERATION:  Right total knee arthroplasty utilizing the DePuy system.  I utilized a size 3 right femoral component posterior cruciate sacrificing type, tibial tray was a size 3.  The insert was a rotating platform, size 3, 10 mm thickness, patella was a size 38 with 3 peg.  All 3 components were cemented and I used gentamicin in the cement.  DESCRIPTION OF PROCEDURE:  Under general anesthesia, routine orthopedic prep and draping of the right lower extremity was carried out.  The appropriate time-out was first carried out.  I also marked the appropriate right leg in the holding area.  The leg was exsanguinated with an Esmarch.  Tourniquet was elevated to 325 mmHg.  An incision was made down the anterior aspect of the right knee.  Bleeders were identified and cauterized.  I then carried out a median parapatellar incision and reflected the patella laterally and flexed the knee at that time.  Following that, I did medial and lateral meniscectomies and excised the anterior and posterior cruciate ligaments.  Following that, I then made my initial drill hole in the intercondylar notch.  The guide rod then was inserted up into the canal and was then thoroughly irrigated out the canal.  Next, jig was inserted and I removed 12 mm thickness off the distal  femur.  Following that, I then prepared the tibia and measured tibia to be a size 3.  The initial drill hole was made in the tibia for the intramedullary guide and I removed 8 mm thickness off the proximal tibia.  Note, she had significant hard bone medially.  At this time, I had to continue to do my soft tissue releases medially, I had to free up the pes anserine and go back posterior because of the contracture.  Following that, I then inserted the lamina spreaders and removed the posterior spurs and made sure I had a good release posteriorly.  At that particular time, I then inserted my spacer blocks, we had a good fit and good stability was a 10 mm thickness in flexion and extension.  Following that, I made my keel cut of the proximal tibia in the usual fashion.  I then did my notch cut out of the distal femur.  Trial components were inserted.  I then did a resurfacing procedure on the patella for a size 38 patella.  Following that, I then removed the trial components and then made few drill holes in the medial tibial plateau because it was extremely hard.  I felt this is best to do for  cement purchase.  After everything was completed, we thoroughly irrigated out the knee and cemented all 3 components in simultaneously. After the cement was hardened, we removed all loose pieces of cement. We water picked out the knee, checked posteriorly, there were no other loose pieces of cement noted.  I then injected a mixture of 20 mL of Exparel with 20 mL of normal saline.  I used half of that mixture to inject posteriorly and medial and lateral and up into the supracondylar region.  We then inserted our permanent size 3, 10 mm thickness rotating flap and we reduced the knee.  We had excellent flexion and excellent extension, good medial and lateral stability.  We then inserted a Hemovac drain and closed the wound layers in usual fashion.  The remaining part of the Exparel and saline mixture was  injected at the end.  She had 2 g of IV Ancef preop.          ______________________________ Kipp Brood. Gladstone Lighter, M.D.     RAG/MEDQ  D:  02/01/2013  T:  02/02/2013  Job:  [id number]

## 2013-02-02 NOTE — Progress Notes (Signed)
Subjective: 1 Day Post-Op Procedure(s) (LRB): RIGHT TOTAL KNEE ARTHROPLASTY (Right) Patient reports pain as 4 on 0-10 scale. Hemovac DCd. WBC has been bizarre. Was elevated on admission secondary to Prednisone then decreased now elevated at 15.6. Will follow.CT chest today to evaluate chest Xray findings.   Objective: Vital signs in last 24 hours: Temp:  [97.5 F (36.4 C)-99.5 F (37.5 C)] 98.3 F (36.8 C) (01/22 0520) Pulse Rate:  [67-96] 88 (01/22 0520) Resp:  [11-20] 14 (01/22 0520) BP: (105-151)/(64-111) 105/65 mmHg (01/22 0520) SpO2:  [100 %] 100 % (01/22 0520) Weight:  [92.987 kg (205 lb)] 92.987 kg (205 lb) (01/21 1250)  Intake/Output from previous day: 01/21 0701 - 01/22 0700 In: 1308 [P.O.:240; I.V.:4000; IV Piggyback:200] Out: 6578 [Urine:4200; Drains:740; Blood:100] Intake/Output this shift:     Recent Labs  02/01/13 0645 02/02/13 0523  HGB 13.8 10.3*    Recent Labs  02/01/13 0645 02/02/13 0523  WBC 13.4* 15.6*  RBC 4.70 3.63*  HCT 40.7 31.4*  PLT 274 238    Recent Labs  02/02/13 0523  NA 138  K 3.8  CL 101  CO2 25  BUN 10  CREATININE 0.65  GLUCOSE 132*  CALCIUM 8.6   No results found for this basename: LABPT, INR,  in the last 72 hours  Dorsiflexion/Plantar flexion intact No cellulitis present  Assessment/Plan: 1 Day Post-Op Procedure(s) (LRB): RIGHT TOTAL KNEE ARTHROPLASTY (Right) Up with therapy CT-scan of the abdomen chest today.  Alison [name] A 02/02/2013, 7:19 AM

## 2013-02-02 NOTE — Progress Notes (Signed)
Physical Therapy Treatment Patient Details Name: Alison [name] MRN: [id number] DOB: 1945/09/02 Today's Date: 02/02/2013 Time: 5188-4166 PT Time Calculation (min): 32 min  PT Assessment / Plan / Recommendation  History of Present Illness     PT Comments     Follow Up Recommendations  SNF     Does the patient have the potential to tolerate intense rehabilitation     Barriers to Discharge        Equipment Recommendations  None recommended by PT    Recommendations for Other Services OT consult  Frequency 7X/week   Progress towards PT Goals Progress towards PT goals: Progressing toward goals  Plan Current plan remains appropriate    Precautions / Restrictions Precautions Precautions: Knee;Fall Required Braces or Orthoses: Knee Immobilizer - Right Knee Immobilizer - Right: Discontinue once straight leg raise with < 10 degree lag Restrictions Weight Bearing Restrictions: No Other Position/Activity Restrictions: WBAT   Pertinent Vitals/Pain 3/10; premed, ice packs provided    Mobility  Bed Mobility Overal bed mobility: Needs Assistance Bed Mobility: Sit to Supine Sit to supine: Min assist;Mod assist General bed mobility comments: cues for sequence and use of L LE to self assist Transfers Overall transfer level: Needs assistance Equipment used: Rolling walker (2 wheeled) Transfers: Sit to/from Stand Sit to Stand: Mod assist General transfer comment: cues for LE management and use of UEs to self assist Ambulation/Gait Ambulation/Gait assistance: Min assist Ambulation Distance (Feet): 88 Feet Assistive device: Rolling walker (2 wheeled) Gait Pattern/deviations: Step-to pattern;Decreased step length - right;Decreased step length - left;Shuffle;Trunk flexed Gait velocity: decr General Gait Details: cues for posture, sequence, stride length and position from RW    Exercises Total Joint Exercises Ankle Circles/Pumps: AROM;15 reps;Supine;Both Quad Sets: AROM;Both;10  reps;Supine Heel Slides: AAROM;Supine;Right;10 reps Straight Leg Raises: AAROM;Right;10 reps;Supine   PT Diagnosis:    PT Problem List:   PT Treatment Interventions:     PT Goals (current goals can now be found in the care plan section) Acute Rehab PT Goals Patient Stated Goal: Rehab and home to resume previous lifestyle with decreased pain PT Goal Formulation: With patient Time For Goal Achievement: 02/09/13 Potential to Achieve Goals: Good  Visit Information  Last PT Received On: 02/02/13 Assistance Needed: +1    Subjective Data  Patient Stated Goal: Rehab and home to resume previous lifestyle with decreased pain   Cognition  Cognition Arousal/Alertness: Awake/alert Behavior During Therapy: WFL for tasks assessed/performed Overall Cognitive Status: Within Functional Limits for tasks assessed    Balance     End of Session PT - End of Session Equipment Utilized During Treatment: Gait belt;Right knee immobilizer Activity Tolerance: Patient tolerated treatment well Patient left: in bed;with call bell/phone within reach Nurse Communication: Mobility status   GP     Hadrian Yarbrough 02/02/2013, 2:05 PM

## 2013-02-02 NOTE — Progress Notes (Signed)
Clinical Social Work Department BRIEF PSYCHOSOCIAL ASSESSMENT 02/02/2013  Patient:  [facility]     Account Number:  [id number]     Admit date:  02/01/2013  Clinical Social Worker:  Lacie Scotts  Date/Time:  02/02/2013 01:26 PM  Referred by:  Physician  Date Referred:  02/02/2013 Referred for  SNF Placement   Other Referral:   Interview type:  Patient Other interview type:    PSYCHOSOCIAL DATA Living Status:  ALONE Admitted from facility:   Level of care:   Primary support name:  Elta Guadeloupe Primary support relationship to patient:  CHILD, ADULT Degree of support available:   unclear    CURRENT CONCERNS Current Concerns  Post-Acute Placement   Other Concerns:    SOCIAL WORK ASSESSMENT / PLAN Pt is a 68 yr old female living at home prior to hospitalization. CSW met with pt to assist with d/c planning. Pt will need ST Rehab following hospital d/c. She is requesting [facility]. SNF has been contacted and a decision is pending. Pt has given CSW permission to initiate SNF search in [facility] in [place] is unable to assist. Bed offers to be provided as received.   Assessment/plan status:  Psychosocial Support/Ongoing Assessment of Needs Other assessment/ plan:   Information/referral to community resources:   SNF  list with bed offers to be provided.    PATIENT'S/FAMILY'S RESPONSE TO PLAN OF CARE: Pt hopes [place] will have an opening for her when ready for d/c.   Werner Lean LCSW [phone]

## 2013-02-02 NOTE — Evaluation (Signed)
Physical Therapy Evaluation Patient Details Name: Alison [name] MRN: [id number] DOB: [birth year] Today's Date: 02/02/2013 Time: 0825-0905 PT Time Calculation (min): 40 min  PT Assessment / Plan / Recommendation History of Present Illness     Clinical Impression  Pt s/p R TKR presents with decreased R LE strength/ROM and post op pain limiting functional mobility.  Pt would benefit from follow up rehab at SNF level to maximize IND and safety prior to return home with limited assist.    PT Assessment  Patient needs continued PT services    Follow Up Recommendations  SNF    Does the patient have the potential to tolerate intense rehabilitation      Barriers to Discharge        Equipment Recommendations  None recommended by PT    Recommendations for Other Services OT consult   Frequency 7X/week    Precautions / Restrictions Precautions Precautions: Knee;Fall Required Braces or Orthoses: Knee Immobilizer - Right Knee Immobilizer - Right: Discontinue once straight leg raise with < 10 degree lag Restrictions Weight Bearing Restrictions: No Other Position/Activity Restrictions: WBAT   Pertinent Vitals/Pain 6/10; premed, ice packs provided      Mobility  Bed Mobility Overal bed mobility: Needs Assistance Bed Mobility: Supine to Sit Supine to sit: Mod assist General bed mobility comments: cues for sequence and use of L LE to self assist Transfers Overall transfer level: Needs assistance Equipment used: Rolling walker (2 wheeled) Transfers: Sit to/from Stand Sit to Stand: Mod assist General transfer comment: cues for LE management and use of UEs to self assist Ambulation/Gait Ambulation/Gait assistance: Min assist;Mod assist Ambulation Distance (Feet): 22 Feet Assistive device: Rolling walker (2 wheeled) Gait Pattern/deviations: Step-to pattern Gait velocity: decr General Gait Details: cues for posture, sequence, stride length and position from RW    Exercises Total  Joint Exercises Ankle Circles/Pumps: AROM;15 reps;Supine;Both Quad Sets: AROM;Both;10 reps;Supine Heel Slides: AAROM;15 reps;Supine;Right Straight Leg Raises: AAROM;Right;10 reps;Supine   PT Diagnosis: Difficulty walking  PT Problem List: Decreased strength;Decreased range of motion;Decreased activity tolerance;Decreased mobility;Decreased knowledge of use of DME;Pain PT Treatment Interventions: DME instruction;Gait training;Functional mobility training;Therapeutic activities;Therapeutic exercise;Patient/family education     PT Goals(Current goals can be found in the care plan section) Acute Rehab PT Goals Patient Stated Goal: Rehab and home to resume previous lifestyle with decreased pain PT Goal Formulation: With patient Time For Goal Achievement: 02/09/13 Potential to Achieve Goals: Good  Visit Information  Last PT Received On: 02/02/13 Assistance Needed: +1       Prior [place] expects to be discharged to:: Skilled nursing facility Living Arrangements: Other relatives Additional Comments: aunt Prior Function Level of Independence: Independent;Independent with assistive device(s) Communication Communication: No difficulties Dominant Hand: Right    Cognition  Cognition Arousal/Alertness: Awake/alert Behavior During Therapy: WFL for tasks assessed/performed Overall Cognitive Status: Within Functional Limits for tasks assessed    Extremity/Trunk Assessment Upper Extremity Assessment Upper Extremity Assessment: Overall WFL for tasks assessed Lower Extremity Assessment Lower Extremity Assessment: RLE deficits/detail RLE Deficits / Details: 2/5 quads with AAROM at R knee -10 - 25 with ++ muscle guarding   Balance    End of Session PT - End of Session Equipment Utilized During Treatment: Gait belt;Right knee immobilizer Activity Tolerance: Patient tolerated treatment well Patient left: in chair;with call bell/phone within reach Nurse  Communication: Mobility status  GP     Alison [name] 02/02/2013, 12:05 PM

## 2013-02-02 NOTE — Progress Notes (Signed)
OT Cancellation Note  Patient Details Name: Alison [name] MRN: [id number] DOB: July 31, 1945   Cancelled Treatment:    Reason Eval/Treat Not Completed: Other (comment) Will defer OT eval to SNF.  Jules Schick [phone] 02/02/2013, 12:35 PM

## 2013-02-03 LAB — CBC
HCT: 29.8 % — ABNORMAL LOW (ref 36.0–46.0)
HEMOGLOBIN: 9.7 g/dL — AB (ref 12.0–15.0)
MCH: 28.6 pg (ref 26.0–34.0)
MCHC: 32.6 g/dL (ref 30.0–36.0)
MCV: 87.9 fL (ref 78.0–100.0)
Platelets: 213 10*3/uL (ref 150–400)
RBC: 3.39 MIL/uL — AB (ref 3.87–5.11)
RDW: 14.4 % (ref 11.5–15.5)
WBC: 15.1 10*3/uL — ABNORMAL HIGH (ref 4.0–10.5)

## 2013-02-03 LAB — BASIC METABOLIC PANEL
BUN: 13 mg/dL (ref 6–23)
CALCIUM: 8.4 mg/dL (ref 8.4–10.5)
CHLORIDE: 101 meq/L (ref 96–112)
CO2: 24 meq/L (ref 19–32)
CREATININE: 0.79 mg/dL (ref 0.50–1.10)
GFR calc Af Amer: >90 mL/min (ref >90)
GFR calc non Af Amer: 84 mL/min — ABNORMAL LOW (ref >90)
GLUCOSE: 138 mg/dL — AB (ref 70–99)
Potassium: 3.5 mEq/L — ABNORMAL LOW (ref 3.7–5.3)
Sodium: 138 mEq/L (ref 137–147)

## 2013-02-03 LAB — GLUCOSE, CAPILLARY
Glucose-Capillary: 142 mg/dL — ABNORMAL HIGH (ref 70–99)
Glucose-Capillary: 122 mg/dL — ABNORMAL HIGH (ref 70–99)
Glucose-Capillary: 116 mg/dL — ABNORMAL HIGH (ref 70–99)
Glucose-Capillary: 130 mg/dL — ABNORMAL HIGH (ref 70–99)

## 2013-02-03 MED ORDER — RIVAROXABAN 10 MG PO TABS: 10.0000 mg | ORAL_TABLET | Freq: Every day | ORAL | Status: DC

## 2013-02-03 MED ORDER — OXYCODONE HCL 5 MG PO TABS: 5.0000 mg | ORAL_TABLET | ORAL | Status: DC | PRN

## 2013-02-03 MED ORDER — POLYETHYLENE GLYCOL 3350 17 G PO PACK: 17.0000 g | PACK | Freq: Every day | ORAL | Status: DC | PRN

## 2013-02-03 MED ORDER — FERROUS SULFATE 325 (65 FE) MG PO TABS: 325.0000 mg | ORAL_TABLET | Freq: Three times a day (TID) | ORAL | Status: DC

## 2013-02-03 MED ORDER — METHOCARBAMOL 500 MG PO TABS: 500.0000 mg | ORAL_TABLET | Freq: Four times a day (QID) | ORAL | Status: DC | PRN

## 2013-02-03 MED ORDER — OXYCODONE HCL 5 MG PO TABS
5.0000 mg | ORAL_TABLET | ORAL | Status: DC | PRN
  Administered 2013-02-03: 10 mg via ORAL
  Administered 2013-02-03 (×2): 5 mg via ORAL
  Administered 2013-02-03: 10 mg via ORAL
  Administered 2013-02-03 – 2013-02-04 (×2): 5 mg via ORAL
  Filled 2013-02-03 (×2): qty 1
  Filled 2013-02-03 (×2): qty 2
  Filled 2013-02-03 (×2): qty 1

## 2013-02-03 NOTE — Progress Notes (Signed)
Physical Therapy Treatment Patient Details Name: Alison [name] MRN: [id number] DOB: 04-Sep-1945 Today's Date: 02/03/2013 Time: 9242-6834 PT Time Calculation (min): 38 min  PT Assessment / Plan / Recommendation  History of Present Illness     PT Comments     Follow Up Recommendations  SNF     Does the patient have the potential to tolerate intense rehabilitation     Barriers to Discharge        Equipment Recommendations  None recommended by PT    Recommendations for Other Services OT consult  Frequency 7X/week   Progress towards PT Goals Progress towards PT goals: Progressing toward goals  Plan Current plan remains appropriate    Precautions / Restrictions Precautions Precautions: Knee;Fall Required Braces or Orthoses: Knee Immobilizer - Right Knee Immobilizer - Right: Discontinue once straight leg raise with < 10 degree lag Restrictions Weight Bearing Restrictions: No Other Position/Activity Restrictions: WBAT   Pertinent Vitals/Pain 5/10; premed, ice packs provided    Mobility  Bed Mobility Overal bed mobility: Needs Assistance Bed Mobility: Sit to Supine;Supine to Sit Supine to sit: Min assist Sit to supine: Min assist General bed mobility comments: cues for sequence and use of L LE to self assist Transfers Overall transfer level: Needs assistance Equipment used: Rolling walker (2 wheeled) Transfers: Sit to/from Stand Sit to Stand: Min assist General transfer comment: cues for LE management and use of UEs to self assist Ambulation/Gait Ambulation/Gait assistance: Min assist Ambulation Distance (Feet): 5 Feet Assistive device: Rolling walker (2 wheeled) Gait Pattern/deviations: Step-to pattern;Decreased step length - right;Decreased step length - left;Shuffle Gait velocity: decr General Gait Details: cues for posture, sequence, stride length and position from RW    Exercises Total Joint Exercises Ankle Circles/Pumps: AROM;Supine;Both;20 reps Quad Sets:  AROM;Both;Supine;20 reps Heel Slides: AAROM;Supine;Right;20 reps Straight Leg Raises: AAROM;Right;Supine;20 reps Long Arc Quad: AAROM;10 reps;Seated;Right   PT Diagnosis:    PT Problem List:   PT Treatment Interventions:     PT Goals (current goals can now be found in the care plan section) Acute Rehab PT Goals Patient Stated Goal: Rehab and home to resume previous lifestyle with decreased pain PT Goal Formulation: With patient Time For Goal Achievement: 02/09/13 Potential to Achieve Goals: Good  Visit Information  Last PT Received On: 02/03/13 Assistance Needed: +1    Subjective Data  Patient Stated Goal: Rehab and home to resume previous lifestyle with decreased pain   Cognition  Cognition Arousal/Alertness: Awake/alert Behavior During Therapy: WFL for tasks assessed/performed Overall Cognitive Status: Within Functional Limits for tasks assessed    Balance     End of Session PT - End of Session Equipment Utilized During Treatment: Gait belt;Right knee immobilizer Activity Tolerance: Patient tolerated treatment well Patient left: in bed Nurse Communication: Mobility status   GP     Alison [name] 02/03/2013, 4:37 PM

## 2013-02-03 NOTE — Care Management Note (Signed)
    Page 1 of 1   02/03/2013     2:16:21 PM   CARE MANAGEMENT NOTE 02/03/2013  Patient:  [facility]   Account Number:  [id number]  Date Initiated:  02/03/2013  Documentation initiated by:  Sherrin Daisy  Subjective/Objective Assessment:   dx total rt knee replacemnt-     Action/Plan:   Plans are for SNF rehab.   Anticipated DC Date:  02/04/2013   Anticipated DC Plan:  SKILLED NURSING FACILITY  In-house referral  Clinical Social Worker      DC Planning Services  CM consult      Choice offered to / List presented to:             Status of service:  Completed, signed off Medicare Important Message given?  NA - LOS <3 / Initial given by admissions (If response is "NO", the following Medicare IM given date fields will be blank) Date Medicare IM given:   Date Additional Medicare IM given:    Discharge Disposition:    Per UR Regulation:    If discussed at Long Length of Stay Meetings, dates discussed:    Comments:

## 2013-02-03 NOTE — Progress Notes (Signed)
Physical Therapy Treatment Patient Details Name: Alison [name] MRN: [id number] DOB: [birth year] Today's Date: 02/03/2013 Time: 2563-8937 PT Time Calculation (min): 23 min  PT Assessment / Plan / Recommendation  History of Present Illness     PT Comments   Pt progressing well.  Follow Up Recommendations  SNF     Does the patient have the potential to tolerate intense rehabilitation     Barriers to Discharge        Equipment Recommendations  None recommended by PT    Recommendations for Other Services OT consult  Frequency 7X/week   Progress towards PT Goals Progress towards PT goals: Progressing toward goals  Plan Current plan remains appropriate    Precautions / Restrictions Precautions Precautions: Knee;Fall Required Braces or Orthoses: Knee Immobilizer - Right Knee Immobilizer - Right: Discontinue once straight leg raise with < 10 degree lag Restrictions Weight Bearing Restrictions: No Other Position/Activity Restrictions: WBAT   Pertinent Vitals/Pain 4/10; premed, ice packs provided    Mobility  Transfers Overall transfer level: Needs assistance Equipment used: Rolling walker (2 wheeled) Transfers: Sit to/from Stand Sit to Stand: Min assist General transfer comment: cues for LE management and use of UEs to self assist Ambulation/Gait Ambulation/Gait assistance: Min assist Ambulation Distance (Feet): 70 Feet (twice and 20') Assistive device: Rolling walker (2 wheeled) Gait Pattern/deviations: Step-to pattern;Decreased step length - right;Decreased step length - left;Shuffle;Trunk flexed Gait velocity: decr General Gait Details: cues for posture, sequence, stride length and position from RW    Exercises     PT Diagnosis:    PT Problem List:   PT Treatment Interventions:     PT Goals (current goals can now be found in the care plan section) Acute Rehab PT Goals Patient Stated Goal: Rehab and home to resume previous lifestyle with decreased pain PT Goal  Formulation: With patient Time For Goal Achievement: 02/09/13 Potential to Achieve Goals: Good  Visit Information  Last PT Received On: 02/03/13 Assistance Needed: +1    Subjective Data  Patient Stated Goal: Rehab and home to resume previous lifestyle with decreased pain   Cognition  Cognition Arousal/Alertness: Awake/alert Behavior During Therapy: WFL for tasks assessed/performed Overall Cognitive Status: Within Functional Limits for tasks assessed    Balance     End of Session PT - End of Session Equipment Utilized During Treatment: Gait belt;Right knee immobilizer Activity Tolerance: Patient tolerated treatment well Patient left: Other (comment) (bathroom) Nurse Communication: Mobility status   GP     Alison [name] 02/03/2013, 12:16 PM

## 2013-02-03 NOTE — Progress Notes (Signed)
Subjective: 2 Days Post-Op Procedure(s) (LRB): RIGHT TOTAL KNEE ARTHROPLASTY (Right) Patient reports pain as 2 on 0-10 scale.No problems this morning. Dressing is fine.Plan on DC tomorrow to [facility]. Afebrile. Hbg 9.7. WBC 15.1 and was elevated on admission. CT Scan of lung will be repeated in 13-months and this was discussed with patient this morning.  Objective: Vital signs in last 24 hours: Temp:  [98.1 F (36.7 C)-98.7 F (37.1 C)] 98.1 F (36.7 C) (01/23 0508) Pulse Rate:  [85-92] 92 (01/23 0508) Resp:  [14-18] 18 (01/23 0508) BP: (120-136)/(60-71) 136/60 mmHg (01/23 0508) SpO2:  [97 %-100 %] 97 % (01/23 0508)  Intake/Output from previous day: 01/22 0701 - 01/23 0700 In: 940 [P.O.:720; I.V.:220] Out: 1130 [Urine:1130] Intake/Output this shift:     Recent Labs  02/01/13 0645 02/02/13 0523 02/03/13 0508  HGB 13.8 10.3* 9.7*    Recent Labs  02/02/13 0523 02/03/13 0508  WBC 15.6* 15.1*  RBC 3.63* 3.39*  HCT 31.4* 29.8*  PLT 238 213    Recent Labs  02/02/13 0523 02/03/13 0508  NA 138 138  K 3.8 3.5*  CL 101 101  CO2 25 24  BUN 10 13  CREATININE 0.65 0.79  GLUCOSE 132* 138*  CALCIUM 8.6 8.4   No results found for this basename: LABPT, INR,  in the last 72 hours  Dorsiflexion/Plantar flexion intact  Assessment/Plan: 2 Days Post-Op Procedure(s) (LRB): RIGHT TOTAL KNEE ARTHROPLASTY (Right) Up with therapy. DC to [facility] on Saturday.  Besse Miron A 02/03/2013, 7:09 AM

## 2013-02-03 NOTE — Progress Notes (Signed)
CSW assisting with d/c planning. D/C Summary has been sent to [facility] for possible d/c SAT. Pt will be transported by EMS. Weekend CSW will assist with d/c planning.  Werner Lean LCSW [phone]

## 2013-02-03 NOTE — Discharge Summary (Signed)
Physician Discharge Summary   Patient ID: Alison [name] MRN: [id number] DOB/AGE: [birth year] 68 y.o.  Admit date: 02/01/2013 Discharge date: 02/04/2013  Primary Diagnosis: Osteoarthritis, right knee  Admission Diagnoses:  Past Medical History  Diagnosis Date  . Unspecified vitamin D deficiency   . Obesity, unspecified   . Goiter, specified as simple   . Pernicious anemia   . Hyperlipidemia LDL goal < 100   . Unspecified glaucoma   . Benign essential hypertension   . Diverticulitis of colon (without mention of hemorrhage)   . Osteoarthrosis, unspecified whether generalized or localized, lower leg   . Osteopenia   . Numbness     TOES RT FOOT  . Diabetes mellitus without complication   . Other specified disease of nail     FUNGUS OF FINGERNAILS   Discharge Diagnoses:   Active Problems:   Osteoarthritis of right knee   History of total knee replacement   Acute blood loss anemia  Estimated body mass index is 33.1 kg/(m^2) as calculated from the following:   Height as of this encounter: 5\' 6"  (1.676 m).   Weight as of this encounter: 92.987 kg (205 lb).  Procedure:  Procedure(s) (LRB): RIGHT TOTAL KNEE ARTHROPLASTY (Right)   Consults: None  HPI: Alison [name], 68 y.o. female, has a history of pain and functional disability in the right knee due to arthritis and has failed non-surgical conservative treatments for greater than 12 weeks to includeNSAID's and/or analgesics, corticosteriod injections and activity modification. Onset of symptoms was gradual, starting 7 years ago with gradually worsening course since that time. The patient noted no past surgery on the right knee(s). Patient currently rates pain in the right knee(s) at 6 out of 10 with activity. Patient has night pain, worsening of pain with activity and weight bearing, pain that interferes with activities of daily living, pain with passive range of motion, crepitus and joint swelling. Patient has evidence of  periarticular osteophytes and joint space narrowing by imaging studies. There is no active infection.   Laboratory Data: Admission on 02/01/2013  Component Date Value Range Status  . ABO/RH(D) 02/01/2013 B POS   Final  . Antibody Screen 02/01/2013 NEG   Final  . Sample Expiration 02/01/2013 02/04/2013   Final  . WBC 02/01/2013 13.4* 4.0 - 10.5 K/uL Final  . RBC 02/01/2013 4.70  3.87 - 5.11 MIL/uL Final  . Hemoglobin 02/01/2013 13.8  12.0 - 15.0 g/dL Final  . HCT 02/03/2013 40.7  36.0 - 46.0 % Final  . MCV 02/01/2013 86.6  78.0 - 100.0 fL Final  . MCH 02/01/2013 29.4  26.0 - 34.0 pg Final  . MCHC 02/01/2013 33.9  30.0 - 36.0 g/dL Final  . RDW 02/03/2013 14.0  11.5 - 15.5 % Final  . Platelets 02/01/2013 274  150 - 400 K/uL Final  . Neutrophils Relative % 02/01/2013 67  43 - 77 % Final  . Neutro Abs 02/01/2013 9.0* 1.7 - 7.7 K/uL Final  . Lymphocytes Relative 02/01/2013 23  12 - 46 % Final  . Lymphs Abs 02/01/2013 3.1  0.7 - 4.0 K/uL Final  . Monocytes Relative 02/01/2013 8  3 - 12 % Final  . Monocytes Absolute 02/01/2013 1.0  0.1 - 1.0 K/uL Final  . Eosinophils Relative 02/01/2013 2  0 - 5 % Final  . Eosinophils Absolute 02/01/2013 0.2  0.0 - 0.7 K/uL Final  . Basophils Relative 02/01/2013 0  0 - 1 % Final  . Basophils Absolute 02/01/2013 0.0  0.0 -  0.1 K/uL Final  . Glucose-Capillary 02/01/2013 102* 70 - 99 mg/dL Final  . Comment 1 02/01/2013 Documented in Chart   Final  . ABO/RH(D) 02/01/2013 B POS   Final  . Glucose-Capillary 02/01/2013 137* 70 - 99 mg/dL Final  . Comment 1 02/01/2013 Documented in Chart   Final  . Comment 2 02/01/2013 Notify RN   Final  . WBC 02/02/2013 15.6* 4.0 - 10.5 K/uL Final  . RBC 02/02/2013 3.63* 3.87 - 5.11 MIL/uL Final  . Hemoglobin 02/02/2013 10.3* 12.0 - 15.0 g/dL Final   Comment: DELTA CHECK NOTED                          REPEATED TO VERIFY  . HCT 02/02/2013 31.4* 36.0 - 46.0 % Final  . MCV 02/02/2013 86.5  78.0 - 100.0 fL Final  . MCH  02/02/2013 28.4  26.0 - 34.0 pg Final  . MCHC 02/02/2013 32.8  30.0 - 36.0 g/dL Final  . RDW 02/02/2013 14.2  11.5 - 15.5 % Final  . Platelets 02/02/2013 238  150 - 400 K/uL Final  . Sodium 02/02/2013 138  137 - 147 mEq/L Final  . Potassium 02/02/2013 3.8  3.7 - 5.3 mEq/L Final  . Chloride 02/02/2013 101  96 - 112 mEq/L Final  . CO2 02/02/2013 25  19 - 32 mEq/L Final  . Glucose, Bld 02/02/2013 132* 70 - 99 mg/dL Final  . BUN 02/02/2013 10  6 - 23 mg/dL Final  . Creatinine, Ser 02/02/2013 0.65  0.50 - 1.10 mg/dL Final  . Calcium 02/02/2013 8.6  8.4 - 10.5 mg/dL Final  . GFR calc non Af Amer 02/02/2013 89* >90 mL/min Final  . GFR calc Af Amer 02/02/2013 >90  >90 mL/min Final   Comment: (NOTE)                          The eGFR has been calculated using the CKD EPI equation.                          This calculation has not been validated in all clinical situations.                          eGFR's persistently <90 mL/min signify possible Chronic Kidney                          Disease.  . Glucose-Capillary 02/01/2013 151* 70 - 99 mg/dL Final  . Comment 1 02/01/2013 Notify RN   Final  . Comment 2 02/01/2013 Documented in Chart   Final  . Glucose-Capillary 02/01/2013 176* 70 - 99 mg/dL Final  . Comment 1 02/01/2013 Documented in Chart   Final  . Comment 2 02/01/2013 Notify RN   Final  . Glucose-Capillary 02/02/2013 119* 70 - 99 mg/dL Final  . Glucose-Capillary 02/02/2013 124* 70 - 99 mg/dL Final  . Comment 1 02/02/2013 Notify RN   Final  . Comment 2 02/02/2013 Documented in Chart   Final  . Glucose-Capillary 02/02/2013 126* 70 - 99 mg/dL Final  . Comment 1 02/02/2013 Notify RN   Final  . Comment 2 02/02/2013 Documented in Chart   Final  . WBC 02/03/2013 15.1* 4.0 - 10.5 K/uL Final  . RBC 02/03/2013 3.39* 3.87 - 5.11 MIL/uL Final  . Hemoglobin 02/03/2013 9.7* 12.0 -  15.0 g/dL Final  . HCT 02/03/2013 29.8* 36.0 - 46.0 % Final  . MCV 02/03/2013 87.9  78.0 - 100.0 fL Final  . MCH  02/03/2013 28.6  26.0 - 34.0 pg Final  . MCHC 02/03/2013 32.6  30.0 - 36.0 g/dL Final  . RDW 02/03/2013 14.4  11.5 - 15.5 % Final  . Platelets 02/03/2013 213  150 - 400 K/uL Final  . Sodium 02/03/2013 138  137 - 147 mEq/L Final  . Potassium 02/03/2013 3.5* 3.7 - 5.3 mEq/L Final  . Chloride 02/03/2013 101  96 - 112 mEq/L Final  . CO2 02/03/2013 24  19 - 32 mEq/L Final  . Glucose, Bld 02/03/2013 138* 70 - 99 mg/dL Final  . BUN 02/03/2013 13  6 - 23 mg/dL Final  . Creatinine, Ser 02/03/2013 0.79  0.50 - 1.10 mg/dL Final  . Calcium 02/03/2013 8.4  8.4 - 10.5 mg/dL Final  . GFR calc non Af Amer 02/03/2013 84* >90 mL/min Final  . GFR calc Af Amer 02/03/2013 >90  >90 mL/min Final   Comment: (NOTE)                          The eGFR has been calculated using the CKD EPI equation.                          This calculation has not been validated in all clinical situations.                          eGFR's persistently <90 mL/min signify possible Chronic Kidney                          Disease.  . Glucose-Capillary 02/02/2013 130* 70 - 99 mg/dL Final  Hospital Outpatient Visit on 01/26/2013  Component Date Value Range Status  . aPTT 01/26/2013 30  24 - 37 seconds Final  . Sodium 01/26/2013 141  137 - 147 mEq/L Final  . Potassium 01/26/2013 4.1  3.7 - 5.3 mEq/L Final  . Chloride 01/26/2013 104  96 - 112 mEq/L Final  . CO2 01/26/2013 25  19 - 32 mEq/L Final  . Glucose, Bld 01/26/2013 98  70 - 99 mg/dL Final  . BUN 01/26/2013 19  6 - 23 mg/dL Final  . Creatinine, Ser 01/26/2013 0.79  0.50 - 1.10 mg/dL Final  . Calcium 01/26/2013 9.3  8.4 - 10.5 mg/dL Final  . Total Protein 01/26/2013 7.4  6.0 - 8.3 g/dL Final  . Albumin 01/26/2013 3.9  3.5 - 5.2 g/dL Final  . AST 01/26/2013 15  0 - 37 U/L Final  . ALT 01/26/2013 17  0 - 35 U/L Final  . Alkaline Phosphatase 01/26/2013 65  39 - 117 U/L Final  . Total Bilirubin 01/26/2013 0.3  0.3 - 1.2 mg/dL Final  . GFR calc non Af Amer 01/26/2013 84* >90 mL/min  Final  . GFR calc Af Amer 01/26/2013 >90  >90 mL/min Final   Comment: (NOTE)                          The eGFR has been calculated using the CKD EPI equation.                          This calculation has not been  validated in all clinical situations.                          eGFR's persistently <90 mL/min signify possible Chronic Kidney                          Disease.  Marland Kitchen Prothrombin Time 01/26/2013 12.6  11.6 - 15.2 seconds Final  . INR 01/26/2013 0.96  0.00 - 1.49 Final  . Color, Urine 01/26/2013 YELLOW  YELLOW Final  . APPearance 01/26/2013 CLEAR  CLEAR Final  . Specific Gravity, Urine 01/26/2013 1.010  1.005 - 1.030 Final  . pH 01/26/2013 5.0  5.0 - 8.0 Final  . Glucose, UA 01/26/2013 NEGATIVE  NEGATIVE mg/dL Final  . Hgb urine dipstick 01/26/2013 LARGE* NEGATIVE Final  . Bilirubin Urine 01/26/2013 NEGATIVE  NEGATIVE Final  . Ketones, ur 01/26/2013 NEGATIVE  NEGATIVE mg/dL Final  . Protein, ur 94/37/1907 NEGATIVE  NEGATIVE mg/dL Final  . Urobilinogen, UA 01/26/2013 0.2  0.0 - 1.0 mg/dL Final  . Nitrite 08/02/7114 NEGATIVE  NEGATIVE Final  . Leukocytes, UA 01/26/2013 NEGATIVE  NEGATIVE Final  . MRSA, PCR 01/26/2013 NEGATIVE  NEGATIVE Final  . Staphylococcus aureus 01/26/2013 NEGATIVE  NEGATIVE Final   Comment:                                 The Xpert SA Assay (FDA                          approved for NASAL specimens                          in patients over 42 years of age),                          is one component of                          a comprehensive surveillance                          program.  Test performance has                          been validated by [employer] for patients greater                          than or equal to 52 year old.                          It is not intended                          to diagnose infection nor to                          guide or monitor treatment.  . WBC 01/26/2013 16.3* 4.0 - 10.5 K/uL Final    .  RBC 01/26/2013 4.91  3.87 - 5.11 MIL/uL Final  . Hemoglobin 01/26/2013 14.2  12.0 - 15.0 g/dL Final  . HCT 01/26/2013 42.2  36.0 - 46.0 % Final  . MCV 01/26/2013 85.9  78.0 - 100.0 fL Final  . MCH 01/26/2013 28.9  26.0 - 34.0 pg Final  . MCHC 01/26/2013 33.6  30.0 - 36.0 g/dL Final  . RDW 01/26/2013 14.2  11.5 - 15.5 % Final  . Platelets 01/26/2013 330  150 - 400 K/uL Final  . Squamous Epithelial / LPF 01/26/2013 RARE  RARE Final  . WBC, UA 01/26/2013 0-2  <3 WBC/hpf Final  . RBC / HPF 01/26/2013 3-6  <3 RBC/hpf Final  . Bacteria, UA 01/26/2013 RARE  RARE Final  . Edwina Barth 01/26/2013 MUCOUS PRESENT   Final  Appointment on 12/26/2012  Component Date Value Range Status  . Hemoglobin A1C 12/26/2012 6.7* 4.8 - 5.6 % Final   Comment:          Increased risk for diabetes: 5.7 - 6.4                                   Diabetes: >6.4                                   Glycemic control for adults with diabetes: <7.0  . Estimated average glucose 12/26/2012 146   Final  . Cholesterol, Total 12/26/2012 157  100 - 199 mg/dL Final  . Triglycerides 12/26/2012 108  0 - 149 mg/dL Final  . HDL 12/26/2012 31* >39 mg/dL Final   Comment: According to ATP-III Guidelines, HDL-C >59 mg/dL is considered a                          negative risk factor for CHD.  Marland Kitchen VLDL Cholesterol Cal 12/26/2012 22  5 - 40 mg/dL Final  . LDL Calculated 12/26/2012 104* 0 - 99 mg/dL Final  . Chol/HDL Ratio 12/26/2012 5.1* 0.0 - 4.4 ratio units Final   Comment:                                   T. Chol/HDL Ratio                                                                      Men  Women                                                        1/2 Avg.Risk  3.4    3.3  Avg.Risk  5.0    4.4                                                         2X Avg.Risk  9.6    7.1                                                         3X Avg.Risk 23.4   11.0  . Glucose  12/26/2012 110* 65 - 99 mg/dL Final  . BUN 12/26/2012 10  8 - 27 mg/dL Final  . Creatinine, Ser 12/26/2012 0.91  0.57 - 1.00 mg/dL Final  . GFR calc non Af Amer 12/26/2012 65  >59 mL/min/1.73 Final  . GFR calc Af Amer 12/26/2012 75  >59 mL/min/1.73 Final  . BUN/Creatinine Ratio 12/26/2012 11  11 - 26 Final  . Sodium 12/26/2012 143  134 - 144 mmol/L Final  . Potassium 12/26/2012 4.1  3.5 - 5.2 mmol/L Final  . Chloride 12/26/2012 104  97 - 108 mmol/L Final  . CO2 12/26/2012 23  18 - 29 mmol/L Final  . Calcium 12/26/2012 9.6  8.6 - 10.2 mg/dL Final     X-Rays:Dg Chest 2 View  01/26/2013   CLINICAL DATA:  Hypertension.  Preoperative chest x-ray .  EXAM: CHEST  2 VIEW  FINDINGS: Mild increased density noted right lung base. This may be from poor inspiration and mild atelectasis. Mild pneumonia cannot be excluded. A follow-up chest x-ray should be considered. Lungs are otherwise clear. No pleural effusion or pneumothorax. Heart size normal. No acute bony abnormality.  IMPRESSION: Mild increased density right lung base, possibly from mild atelectasis. To exclude pneumonia follow-up chest x-ray suggested.  COMPARISON:A: COMPARISON:A 09/11/2007.   Electronically Signed   By: Marcello Moores  Register   On: 01/26/2013 11:09   Ct Chest Wo Contrast  02/02/2013   CLINICAL DATA:  Preoperative abnormal chest x-ray. Congestion. Smoker. Diabetic and hyperlipidemia.  EXAM: CT CHEST WITHOUT CONTRAST  TECHNIQUE: Multidetector CT imaging of the chest was performed following the standard protocol without IV contrast.  COMPARISON:  01/26/2013 chest x-ray.  FINDINGS: Bibasilar parenchymal changes have an appearance most suggestive of bibasilar subsegmental atelectasis. Infectious infiltrate felt to be a less likely consideration although component of mild bronchitis not excluded.  Other areas of minimal parenchymal changes most suggestive of scarring. There is one region within the right upper lobe (602 image 50) which has a  slightly ground-glass appearance. Given the patient's history of smoking, followup unenhanced CT of the chest in 3 months recommended for further delineation of this finding.  Heart size within normal limits.  Coronary artery calcifications.  Atherosclerotic type changes of the thoracic aorta. Ascending thoracic aorta slightly ectatic measuring up to 3.6 cm. Atherosclerotic type changes origin of the great vessels.  No mediastinal, hilar are or axillary adenopathy  Mild thoracic scoliosis without bony destructive lesion.  Unenhanced imaging of upper abdominal structures unremarkable.  IMPRESSION: Bibasilar parenchymal changes have an appearance most suggestive of bibasilar subsegmental atelectasis. Infectious infiltrate felt to be a less likely consideration although component of mild bronchitis not excluded.  Other areas of minimal parenchymal changes most  suggestive of scarring. There is one region within the right upper lobe (602 image 50) which has a slightly ground-glass appearance. Given the patient's history of smoking, followup unenhanced CT of the chest in 3 months recommended for further delineation of this finding.  Atherosclerotic type changes as detailed above.   Electronically Signed   By: Chauncey Cruel M.D.   On: 02/02/2013 08:47    EKG: Orders placed during the hospital encounter of 01/26/13  . EKG 12-LEAD  . EKG 12-LEAD     Hospital Course: Alison [name] is a 68 y.o. who was admitted to [facility] They were brought to the operating room on 02/01/2013 and underwent Procedure(s): RIGHT TOTAL KNEE ARTHROPLASTY.  Patient tolerated the procedure well and was later transferred to the recovery room and then to the orthopaedic floor for postoperative care.  They were given PO and IV analgesics for pain control following their surgery.  They were given 24 hours of postoperative antibiotics of  Anti-infectives   Start     Dose/Rate Route Frequency Ordered Stop   02/01/13 1600  ceFAZolin  (ANCEF) IVPB 1 g/50 mL premix     1 g 100 mL/hr over 30 Minutes Intravenous Every 6 hours 02/01/13 1259 02/01/13 2156   02/01/13 0904  polymyxin B 500,000 Units, bacitracin 50,000 Units in sodium chloride irrigation 0.9 % 500 mL irrigation  Status:  Discontinued       As needed 02/01/13 0904 02/01/13 1052   02/01/13 0624  ceFAZolin (ANCEF) IVPB 2 g/50 mL premix     2 g 100 mL/hr over 30 Minutes Intravenous On call to O.R. 02/01/13 4008 02/01/13 0847     and started on DVT prophylaxis in the form of Xarelto.   PT and OT were ordered for total joint protocol.  Discharge planning consulted to help with postop disposition and equipment needs.  Patient had a fair night on the evening of surgery.  They started to get up OOB with therapy on day one. Hemovac drain was pulled without difficulty.  CT chest was ordered to evaluate questionable changes noted on her preop CXR. Due to evidence of scarring, recommended repeat Ct in 3 months. Continued to work with therapy into day two.  She was doing well, with only complaints of fatigue. No SOB or chest pain.  By day three, the patient had progressed with therapy and meeting their goals. Patient was seen in rounds and was ready to go to SNF.   Discharge Medications: Prior to Admission medications   Medication Sig Start Date End Date Taking? Authorizing Provider  amLODipine (NORVASC) 10 MG tablet Take 10 mg by mouth daily with breakfast. Take one tablet once daily for blood pressure and to protect the heart 06/02/12  Yes Tiffany L Reed, DO  Calcium Carbonate-Vitamin D (CALTRATE 600+D PO) Take 1 tablet by mouth daily.    Yes Historical Provider, MD  carboxymethylcellulose (REFRESH PLUS) 0.5 % SOLN 1 drop 3 (three) times daily as needed.   Yes Historical Provider, MD  celecoxib (CELEBREX) 200 MG capsule Take 200 mg by mouth daily. 06/02/12  Yes Tiffany L Reed, DO  latanoprost (XALATAN) 0.005 % ophthalmic solution Place 1 drop into both eyes at bedtime.    Yes  Historical Provider, MD  metFORMIN (GLUCOPHAGE) 500 MG tablet Take 250 mg by mouth daily with supper. 12/29/12  Yes Tiffany L Reed, DO  Multiple Vitamins-Minerals (CENTRUM PO) Take 1 tablet by mouth daily.    Yes Historical Provider, MD  Omega-3  Fatty Acids (FISH OIL BURP-LESS) 1000 MG CAPS Take 1,000 mg by mouth daily. 12/29/12  Yes Tiffany L Reed, DO  pravastatin (PRAVACHOL) 80 MG tablet Take 80 mg by mouth daily. 10/03/12  Yes Tiffany L Reed, DO  ferrous sulfate 325 (65 FE) MG tablet Take 1 tablet (325 mg total) by mouth 3 (three) times daily after meals. 02/03/13   Earl Zellmer Renelda Loma, PA-C  methocarbamol (ROBAXIN) 500 MG tablet Take 1 tablet (500 mg total) by mouth every 6 (six) hours as needed for muscle spasms. 02/03/13   Denelle Capurro Renelda Loma, PA-C  oxyCODONE (OXY IR/ROXICODONE) 5 MG immediate release tablet Take 1-3 tablets (5-15 mg total) by mouth every 4 (four) hours as needed for severe pain. 02/03/13   Wolf Boulay Renelda Loma, PA-C  polyethylene glycol (MIRALAX / GLYCOLAX) packet Take 17 g by mouth daily as needed for mild constipation. 02/03/13   Krystian Younglove Renelda Loma, PA-C  rivaroxaban (XARELTO) 10 MG TABS tablet Take 1 tablet (10 mg total) by mouth daily with breakfast. 02/03/13   Margaretta Chittum Renelda Loma, PA-C    Diet: Diabetic diet Activity:WBAT Follow-up:in 2 weeks Disposition - Skilled nursing facility Discharged Condition: good   Discharge Orders   Future Appointments Provider Department Dept Phone   03/30/2013 8:30 AM [facility] [phone]   04/03/2013 8:30 AM Gayland Curry, [facility] [phone]   Future Orders Complete By Expires   Call MD / Call 911  As directed    Comments:     If you experience chest pain or shortness of breath, CALL 911 and be transported to the hospital emergency room.  If you develope a fever above 101 F, pus (white drainage) or increased drainage or redness at the wound, or calf pain, call your surgeon's  office.   Constipation Prevention  As directed    Comments:     Drink plenty of fluids.  Prune juice may be helpful.  You may use a stool softener, such as Colace (over the counter) 100 mg twice a day.  Use MiraLax (over the counter) for constipation as needed.   Diet Carb Modified  As directed    Discharge instructions  As directed    Comments:     Walk with your walker until PT transitions you to the cane Weight bearing as tolerated Shower only, no tub bath. Do not change the dressing over the incision unless there is drainage Call if any temperatures greater than 101 or any wound complications: [phone] during the day and ask for Dr. Charlestine Night nurse, Brunilda Payor. Discontinue all vitamins and supplements until completion of 3 week course of Xarelto   Do not put a pillow under the knee. Place it under the heel.  As directed    Driving restrictions  As directed    Comments:     No driving   Increase activity slowly as tolerated  As directed        Medication List    STOP taking these medications       CALTRATE 600+D PO     celecoxib 200 MG capsule  Commonly known as:  CELEBREX     CENTRUM PO     FISH OIL BURP-LESS 1000 MG Caps      TAKE these medications       amLODipine 10 MG tablet  Commonly known as:  NORVASC  Take 10 mg by mouth daily with breakfast. Take one tablet once daily for blood pressure and to protect the heart  carboxymethylcellulose 0.5 % Soln  Commonly known as:  REFRESH PLUS  1 drop 3 (three) times daily as needed.     ferrous sulfate 325 (65 FE) MG tablet  Take 1 tablet (325 mg total) by mouth 3 (three) times daily after meals.     latanoprost 0.005 % ophthalmic solution  Commonly known as:  XALATAN  Place 1 drop into both eyes at bedtime.     metFORMIN 500 MG tablet  Commonly known as:  GLUCOPHAGE  Take 250 mg by mouth daily with supper.     methocarbamol 500 MG tablet  Commonly known as:  ROBAXIN  Take 1 tablet (500 mg total) by mouth  every 6 (six) hours as needed for muscle spasms.     oxyCODONE 5 MG immediate release tablet  Commonly known as:  Oxy IR/ROXICODONE  Take 1-3 tablets (5-15 mg total) by mouth every 4 (four) hours as needed for severe pain.     polyethylene glycol packet  Commonly known as:  MIRALAX / GLYCOLAX  Take 17 g by mouth daily as needed for mild constipation.     pravastatin 80 MG tablet  Commonly known as:  PRAVACHOL  Take 80 mg by mouth daily.     rivaroxaban 10 MG Tabs tablet  Commonly known as:  XARELTO  Take 1 tablet (10 mg total) by mouth daily with breakfast.           Follow-up Information   Follow up with GIOFFRE,RONALD A, MD. Schedule an appointment as soon as possible for a visit in 2 weeks.   Specialty:  Orthopedic Surgery   Contact information:   [address] [place] [address] [phone]       Signed: Ardeen Jourdain [facility] 02/03/2013, 7:08 AM

## 2013-02-04 DIAGNOSIS — E785 Hyperlipidemia, unspecified: Secondary | ICD-10-CM | POA: Diagnosis not present

## 2013-02-04 DIAGNOSIS — M25569 Pain in unspecified knee: Secondary | ICD-10-CM | POA: Diagnosis not present

## 2013-02-04 DIAGNOSIS — M949 Disorder of cartilage, unspecified: Secondary | ICD-10-CM | POA: Diagnosis not present

## 2013-02-04 DIAGNOSIS — IMO0002 Reserved for concepts with insufficient information to code with codable children: Secondary | ICD-10-CM | POA: Diagnosis not present

## 2013-02-04 DIAGNOSIS — M1990 Unspecified osteoarthritis, unspecified site: Secondary | ICD-10-CM | POA: Diagnosis not present

## 2013-02-04 DIAGNOSIS — I10 Essential (primary) hypertension: Secondary | ICD-10-CM | POA: Diagnosis not present

## 2013-02-04 DIAGNOSIS — S8990XA Unspecified injury of unspecified lower leg, initial encounter: Secondary | ICD-10-CM | POA: Diagnosis not present

## 2013-02-04 DIAGNOSIS — E040 Nontoxic diffuse goiter: Secondary | ICD-10-CM | POA: Diagnosis not present

## 2013-02-04 DIAGNOSIS — R279 Unspecified lack of coordination: Secondary | ICD-10-CM | POA: Diagnosis not present

## 2013-02-04 DIAGNOSIS — D649 Anemia, unspecified: Secondary | ICD-10-CM | POA: Diagnosis not present

## 2013-02-04 DIAGNOSIS — E119 Type 2 diabetes mellitus without complications: Secondary | ICD-10-CM | POA: Diagnosis not present

## 2013-02-04 DIAGNOSIS — K5732 Diverticulitis of large intestine without perforation or abscess without bleeding: Secondary | ICD-10-CM | POA: Diagnosis not present

## 2013-02-04 DIAGNOSIS — D62 Acute posthemorrhagic anemia: Secondary | ICD-10-CM | POA: Diagnosis not present

## 2013-02-04 DIAGNOSIS — Z96659 Presence of unspecified artificial knee joint: Secondary | ICD-10-CM | POA: Diagnosis not present

## 2013-02-04 DIAGNOSIS — M899 Disorder of bone, unspecified: Secondary | ICD-10-CM | POA: Diagnosis not present

## 2013-02-04 DIAGNOSIS — Z471 Aftercare following joint replacement surgery: Secondary | ICD-10-CM | POA: Diagnosis not present

## 2013-02-04 DIAGNOSIS — M1710 Unilateral primary osteoarthritis, unspecified knee: Secondary | ICD-10-CM | POA: Diagnosis not present

## 2013-02-04 DIAGNOSIS — E669 Obesity, unspecified: Secondary | ICD-10-CM | POA: Diagnosis not present

## 2013-02-04 DIAGNOSIS — H409 Unspecified glaucoma: Secondary | ICD-10-CM | POA: Diagnosis not present

## 2013-02-04 DIAGNOSIS — IMO0001 Reserved for inherently not codable concepts without codable children: Secondary | ICD-10-CM | POA: Diagnosis not present

## 2013-02-04 DIAGNOSIS — R269 Unspecified abnormalities of gait and mobility: Secondary | ICD-10-CM | POA: Diagnosis not present

## 2013-02-04 LAB — CBC
HEMATOCRIT: 28.9 % — AB (ref 36.0–46.0)
HEMOGLOBIN: 9.5 g/dL — AB (ref 12.0–15.0)
MCH: 28.9 pg (ref 26.0–34.0)
MCHC: 32.9 g/dL (ref 30.0–36.0)
MCV: 87.8 fL (ref 78.0–100.0)
Platelets: 230 10*3/uL (ref 150–400)
RBC: 3.29 MIL/uL — ABNORMAL LOW (ref 3.87–5.11)
RDW: 14.3 % (ref 11.5–15.5)
WBC: 12.7 10*3/uL — ABNORMAL HIGH (ref 4.0–10.5)

## 2013-02-04 LAB — GLUCOSE, CAPILLARY
GLUCOSE-CAPILLARY: 112 mg/dL — AB (ref 70–99)
Glucose-Capillary: 125 mg/dL — ABNORMAL HIGH (ref 70–99)

## 2013-02-04 NOTE — Progress Notes (Signed)
Per MD, Pt ready for d/c.  Notified RN, Pt and facility.  Pt stated that her family is aware of her d/c plans today.  Confirmed receipt of d/c summary, as admission was arranged by [facility] CSW.  Facility able to receive Pt after lunch.  Arranged for transportation for 1230 or after.  Pt to be d/c'd.  Bernita Raisin, [place] [phone]

## 2013-02-04 NOTE — Progress Notes (Signed)
   Subjective: 3 Days Post-Op Procedure(s) (LRB): RIGHT TOTAL KNEE ARTHROPLASTY (Right) Patient reports pain as mild.   Patient seen in rounds with Dr. Wynelle Link.  Sitting up in chair and doing okay Patient is well, and has had no acute complaints or problems Patient is ready to go SNF today.  Objective: Vital signs in last 24 hours: Temp:  [98.3 F (36.8 C)-99.3 F (37.4 C)] 98.3 F (36.8 C) (01/24 0500) Pulse Rate:  [93-98] 98 (01/24 0500) Resp:  [16-18] 18 (01/24 0747) BP: (129-138)/(68-78) 138/68 mmHg (01/24 0500) SpO2:  [98 %-100 %] 100 % (01/24 0747)  Intake/Output from previous day:  Intake/Output Summary (Last 24 hours) at 02/04/13 0828 Last data filed at 02/04/13 3557  Gross per 24 hour  Intake    920 ml  Output    200 ml  Net    720 ml    Intake/Output this shift: Total I/O In: 360 [P.O.:360] Out: -   Labs:  Recent Labs  02/02/13 0523 02/03/13 0508 02/04/13 0506  HGB 10.3* 9.7* 9.5*    Recent Labs  02/03/13 0508 02/04/13 0506  WBC 15.1* 12.7*  RBC 3.39* 3.29*  HCT 29.8* 28.9*  PLT 213 230    Recent Labs  02/02/13 0523 02/03/13 0508  NA 138 138  K 3.8 3.5*  CL 101 101  CO2 25 24  BUN 10 13  CREATININE 0.65 0.79  GLUCOSE 132* 138*  CALCIUM 8.6 8.4   No results found for this basename: LABPT, INR,  in the last 72 hours  EXAM: General - Patient is Alert, Appropriate and Oriented Extremity - Neurovascular intact Sensation intact distally Dorsiflexion/Plantar flexion intact No cellulitis present Incision - clean, dry, no drainage Motor Function - intact, moving foot and toes well on exam.   Assessment/Plan: 3 Days Post-Op Procedure(s) (LRB): RIGHT TOTAL KNEE ARTHROPLASTY (Right) Procedure(s) (LRB): RIGHT TOTAL KNEE ARTHROPLASTY (Right) Past Medical History  Diagnosis Date  . Unspecified vitamin D deficiency   . Obesity, unspecified   . Goiter, specified as simple   . Pernicious anemia   . Hyperlipidemia LDL goal < 100   .  Unspecified glaucoma   . Benign essential hypertension   . Diverticulitis of colon (without mention of hemorrhage)   . Osteoarthrosis, unspecified whether generalized or localized, lower leg   . Osteopenia   . Numbness     TOES RT FOOT  . Diabetes mellitus without complication   . Other specified disease of nail     FUNGUS OF FINGERNAILS   Active Problems:   Osteoarthritis of right knee   History of total knee replacement   Acute blood loss anemia  Estimated body mass index is 33.1 kg/(m^2) as calculated from the following:   Height as of this encounter: 5\' 6"  (1.676 m).   Weight as of this encounter: 92.987 kg (205 lb). Up with therapy Discharge to SNF Diet - Cardiac diet Follow up - in 2 weeks Activity - WBAT Disposition - Skilled nursing facility Condition Upon Discharge - Good D/C Meds - See DC Summary DVT Prophylaxis - Xarelto  PERKINS, ALEXZANDREW 02/04/2013, 8:28 AM

## 2013-02-04 NOTE — Progress Notes (Signed)
Physical Therapy Treatment Patient Details Name: Aayushi Solorzano MRN: [id number] DOB: 04/10/1945 Today's Date: 02/04/2013 Time: 7616-0737 PT Time Calculation (min): 31 min  PT Assessment / Plan / Recommendation  History of Present Illness     PT Comments     Follow Up Recommendations  SNF     Does the patient have the potential to tolerate intense rehabilitation     Barriers to Discharge        Equipment Recommendations  None recommended by PT    Recommendations for Other Services OT consult  Frequency 7X/week   Progress towards PT Goals Progress towards PT goals: Progressing toward goals  Plan Current plan remains appropriate    Precautions / Restrictions Precautions Precautions: Knee;Fall Required Braces or Orthoses: Knee Immobilizer - Right Knee Immobilizer - Right: Discontinue once straight leg raise with < 10 degree lag Restrictions Weight Bearing Restrictions: No Other Position/Activity Restrictions: WBAT   Pertinent Vitals/Pain 4/10; premed, ice packs provided    Mobility  Bed Mobility Overal bed mobility: Needs Assistance Bed Mobility: Sit to Supine Sit to supine: Min assist General bed mobility comments: cues for sequence and use of L LE to self assist Transfers Overall transfer level: Needs assistance Equipment used: Rolling walker (2 wheeled) Transfers: Sit to/from Stand Sit to Stand: Min assist General transfer comment: cues for LE management and use of UEs to self assist Ambulation/Gait Ambulation/Gait assistance: Min assist;Min guard Ambulation Distance (Feet): 140 Feet Assistive device: Rolling walker (2 wheeled) Gait Pattern/deviations: Step-to pattern;Step-through pattern;Shuffle;Trunk flexed General Gait Details: cues for posture, sequence, stride length and position from RW    Exercises Total Joint Exercises Ankle Circles/Pumps: AROM;Supine;Both;20 reps Quad Sets: AROM;Both;Supine;20 reps Heel Slides: AAROM;Supine;Right;20 reps Straight  Leg Raises: AAROM;Right;Supine;20 reps   PT Diagnosis:    PT Problem List:   PT Treatment Interventions:     PT Goals (current goals can now be found in the care plan section) Acute Rehab PT Goals Patient Stated Goal: Rehab and home to resume previous lifestyle with decreased pain PT Goal Formulation: With patient Time For Goal Achievement: 02/09/13 Potential to Achieve Goals: Good  Visit Information  Last PT Received On: 02/04/13 Assistance Needed: +1    Subjective Data  Patient Stated Goal: Rehab and home to resume previous lifestyle with decreased pain   Cognition  Cognition Arousal/Alertness: Awake/alert Behavior During Therapy: WFL for tasks assessed/performed Overall Cognitive Status: Within Functional Limits for tasks assessed    Balance     End of Session PT - End of Session Equipment Utilized During Treatment: Gait belt;Right knee immobilizer Activity Tolerance: Patient tolerated treatment well Patient left: in bed Nurse Communication: Mobility status   GP     Berry Gallacher 02/04/2013, 1:02 PM

## 2013-02-04 NOTE — Progress Notes (Signed)
Pt transported via [facility] to [facility].  All paperwork sent with pt.

## 2013-02-04 NOTE — Op Note (Signed)
Report called to [facility]. 

## 2013-02-06 NOTE — Progress Notes (Signed)
Clinical Social Work Department CLINICAL SOCIAL WORK PLACEMENT NOTE 02/06/2013  Patient:  [facility]  Account Number:  [id number] [place] date:  02/01/2013  Clinical Social Worker:  Werner Lean, LCSW  Date/time:  02/02/2013 01:38 PM  Clinical Social Work is seeking post-discharge placement for this patient at the following level of care:   SKILLED NURSING   (*CSW will update this form in Epic as items are completed)     Patient/family provided with [place] Department of Clinical Social Work's list of facilities offering this level of care within the geographic area requested by the patient (or if unable, by the patient's family).  02/02/2013  Patient/family informed of their freedom to choose among providers that offer the needed level of care, that participate in Medicare, Medicaid or managed care program needed by the patient, have an available bed and are willing to accept the patient.    Patient/family informed of [facility]' ownership interest in [facility], as well as of the fact that they are under no obligation to receive care at this facility.  PASARR submitted to EDS on 02/02/2013 PASARR number received from EDS on 02/02/2013  FL2 transmitted to all facilities in geographic area requested by pt/family on  02/02/2013 FL2 transmitted to all facilities within larger geographic area on   Patient informed that his/her managed care company has contracts with or will negotiate with  certain facilities, including the following:     Patient/family informed of bed offers received:  02/01/2013 Patient chooses bed at [place] Physician recommends and patient chooses bed at    Patient to be transferred to [place] on  02/04/2013 Patient to be transferred to facility by EMS  The following physician request were entered in Epic:   Additional Comments:  Werner Lean LCSW

## 2013-02-08 ENCOUNTER — Non-Acute Institutional Stay (SKILLED_NURSING_FACILITY): Payer: Medicare Other | Admitting: Adult Health

## 2013-02-08 DIAGNOSIS — IMO0002 Reserved for concepts with insufficient information to code with codable children: Secondary | ICD-10-CM

## 2013-02-08 DIAGNOSIS — IMO0001 Reserved for inherently not codable concepts without codable children: Secondary | ICD-10-CM

## 2013-02-08 DIAGNOSIS — E785 Hyperlipidemia, unspecified: Secondary | ICD-10-CM

## 2013-02-08 DIAGNOSIS — D62 Acute posthemorrhagic anemia: Secondary | ICD-10-CM | POA: Diagnosis not present

## 2013-02-08 DIAGNOSIS — I10 Essential (primary) hypertension: Secondary | ICD-10-CM | POA: Diagnosis not present

## 2013-02-08 DIAGNOSIS — M1711 Unilateral primary osteoarthritis, right knee: Secondary | ICD-10-CM

## 2013-02-08 DIAGNOSIS — E1165 Type 2 diabetes mellitus with hyperglycemia: Secondary | ICD-10-CM

## 2013-02-08 DIAGNOSIS — M1710 Unilateral primary osteoarthritis, unspecified knee: Secondary | ICD-10-CM | POA: Diagnosis not present

## 2013-02-08 NOTE — Progress Notes (Signed)
Patient ID: Alison [name], female   DOB: 04-12-1945, 68 y.o.   MRN: [id number]               PROGRESS NOTE  DATE: 02/08/2013  FACILITY: Nursing Home Location: [facility]  LEVEL OF CARE: SNF (31)  Acute Visit  CHIEF COMPLAINT:  Follow-up hospitalization  HISTORY OF PRESENT ILLNESS: This is a 68 year old female who has been admitted to [facility] on 02/04/13 from [facility] with Osteoarthritis S/P right total knee arthroplasty. She has been admitted for a short-term rehabilitation.  REASSESSMENT OF ONGOING PROBLEM(S):  HTN: Pt 's HTN remains stable.  Denies CP, sob, DOE, pedal edema, headaches, dizziness or visual disturbances.  No complications from the medications currently being used.  Last BP : 138/88  ANEMIA: The anemia has been stable. The patient denies fatigue, melena or hematochezia. No complications from the medications currently being used. 1/15 hgb 9.7  DM:pt's DM remains stable.  Pt denies polyuria, polydipsia, polyphagia, changes in vision or hypoglycemic episodes.  No complications noted from the medication presently being used.    12/14 hemoglobin A1c is:6.7  PAST MEDICAL HISTORY : Reviewed.  No changes.  CURRENT MEDICATIONS: Reviewed per [facility]  REVIEW OF SYSTEMS:  GENERAL: no change in appetite, no fatigue, no weight changes, no fever, chills or weakness RESPIRATORY: no cough, SOB, DOE, wheezing, hemoptysis CARDIAC: no chest pain, edema or palpitations GI: no abdominal pain, diarrhea, constipation, heart burn, nausea or vomiting  PHYSICAL EXAMINATION  VS:  T96.7       P84      RR18      BP138/88         WT205.4 (Lb)  GENERAL: no acute distress, normal body habitus EYES: conjunctivae normal, sclerae normal, normal eye lids NECK: supple, trachea midline, no neck masses, no thyroid tenderness, no thyromegaly LYMPHATICS: no LAN in the neck, no supraclavicular LAN RESPIRATORY: breathing is even & unlabored, BS CTAB CARDIAC: RRR, no  murmur,no extra heart sounds, no edema GI: abdomen soft, normal BS, no masses, no tenderness, no hepatomegaly, no splenomegaly PSYCHIATRIC: the patient is alert & oriented to person, affect & behavior appropriate  LABS/RADIOLOGY: Labs reviewed: Basic Metabolic Panel:  Recent Labs  01/26/13 1040 02/02/13 0523 02/03/13 0508  NA 141 138 138  K 4.1 3.8 3.5*  CL 104 101 101  CO2 25 25 24   GLUCOSE 98 132* 138*  BUN 19 10 13   CREATININE 0.79 0.65 0.79  CALCIUM 9.3 8.6 8.4   Liver Function Tests:  Recent Labs  06/02/12 1025 01/26/13 1040  AST 17 15  ALT 11 17  ALKPHOS 66 65  BILITOT 0.4 0.3  PROT 7.1 7.4  ALBUMIN  --  3.9   CBC:  Recent Labs  06/02/12 1025  02/01/13 0645 02/02/13 0523 02/03/13 0508 02/04/13 0506  WBC 8.6  < > 13.4* 15.6* 15.1* 12.7*  NEUTROABS 5.8  --  9.0*  --   --   --   HGB 13.1  < > 13.8 10.3* 9.7* 9.5*  HCT 39.2  < > 40.7 31.4* 29.8* 28.9*  MCV 85  < > 86.6 86.5 87.9 87.8  PLT  --   < > 274 238 213 230  < > = values in this interval not displayed.   Lipid Panel:  Recent Labs  06/02/12 1025 12/26/12 0810  HDL 36* 31*   CBG:  Recent Labs  02/03/13 2123 02/04/13 0708 02/04/13 1056  GLUCAP 130* 125* 112*  ASSESSMENT/PLAN:  Osteoarthritis status post right total knee arthroplasty - for rehabilitation  Hypertension - well controlled; continue Amlodipine  Anemia - continue ferrous sulfate  Hyperlipidemia - continue pravastatin   CPT CODE: 74259

## 2013-02-15 ENCOUNTER — Non-Acute Institutional Stay (SKILLED_NURSING_FACILITY): Payer: Medicare Other | Admitting: Internal Medicine

## 2013-02-15 DIAGNOSIS — I10 Essential (primary) hypertension: Secondary | ICD-10-CM | POA: Diagnosis not present

## 2013-02-15 DIAGNOSIS — M1710 Unilateral primary osteoarthritis, unspecified knee: Secondary | ICD-10-CM

## 2013-02-15 DIAGNOSIS — D62 Acute posthemorrhagic anemia: Secondary | ICD-10-CM

## 2013-02-15 DIAGNOSIS — M1711 Unilateral primary osteoarthritis, right knee: Secondary | ICD-10-CM

## 2013-02-15 DIAGNOSIS — E119 Type 2 diabetes mellitus without complications: Secondary | ICD-10-CM | POA: Diagnosis not present

## 2013-02-15 DIAGNOSIS — IMO0002 Reserved for concepts with insufficient information to code with codable children: Secondary | ICD-10-CM | POA: Diagnosis not present

## 2013-02-16 ENCOUNTER — Non-Acute Institutional Stay (SKILLED_NURSING_FACILITY): Payer: Medicare Other | Admitting: Adult Health

## 2013-02-16 DIAGNOSIS — I10 Essential (primary) hypertension: Secondary | ICD-10-CM

## 2013-02-16 DIAGNOSIS — IMO0002 Reserved for concepts with insufficient information to code with codable children: Secondary | ICD-10-CM

## 2013-02-16 DIAGNOSIS — M1711 Unilateral primary osteoarthritis, right knee: Secondary | ICD-10-CM

## 2013-02-16 DIAGNOSIS — D62 Acute posthemorrhagic anemia: Secondary | ICD-10-CM | POA: Diagnosis not present

## 2013-02-16 DIAGNOSIS — E1165 Type 2 diabetes mellitus with hyperglycemia: Secondary | ICD-10-CM

## 2013-02-16 DIAGNOSIS — M1710 Unilateral primary osteoarthritis, unspecified knee: Secondary | ICD-10-CM | POA: Diagnosis not present

## 2013-02-16 DIAGNOSIS — IMO0001 Reserved for inherently not codable concepts without codable children: Secondary | ICD-10-CM | POA: Diagnosis not present

## 2013-02-16 DIAGNOSIS — E785 Hyperlipidemia, unspecified: Secondary | ICD-10-CM

## 2013-02-16 NOTE — Progress Notes (Signed)
Patient ID: Alison [name], female   DOB: 1945-03-03, 68 y.o.   MRN: [id number]                 PROGRESS NOTE  DATE: 02/16/13  FACILITY: Nursing Home Location: [facility]  LEVEL OF CARE: SNF (31)  Acute Visit  CHIEF COMPLAINT:  Discharge Notes  HISTORY OF PRESENT ILLNESS: This is a 68 year old female who is for discharge home and will have outpatient rehabilitation. She has been admitted to [facility] on 02/04/13 from [facility] with Osteoarthritis S/P right total knee arthroplasty. Patient was admitted to this facility for short-term rehabilitation after the patient's recent hospitalization.  Patient has completed SNF rehabilitation and therapy has cleared the patient for discharge.   REASSESSMENT OF ONGOING PROBLEM(S):  HTN: Pt 's HTN remains stable.  Denies CP, sob, DOE, pedal edema, headaches, dizziness or visual disturbances.  No complications from the medications currently being used.  Last BP : 129/70  ANEMIA: The anemia has been stable. The patient denies fatigue, melena or hematochezia. No complications from the medications currently being used. 1/15 hgb 8.3  HYPERLIPIDEMIA: No complications from the medications presently being used.  12/14 lipid panel showed : cholesterol 157 3 times a day 3 right 180 HDL 31 LDL 104  PAST MEDICAL HISTORY : Reviewed.  No changes.  CURRENT MEDICATIONS: Reviewed per [facility]  REVIEW OF SYSTEMS:  GENERAL: no change in appetite, no fatigue, no weight changes, no fever, chills or weakness RESPIRATORY: no cough, SOB, DOE, wheezing, hemoptysis CARDIAC: no chest pain, edema or palpitations GI: no abdominal pain, diarrhea, constipation, heart burn, nausea or vomiting  PHYSICAL EXAMINATION  VS:  T98      P84      RR20      BP129/70        WT203.4 (Lb)  GENERAL: no acute distress, normal body habitus EYES: conjunctivae normal, sclerae normal, normal eye lids NECK: supple, trachea midline, no neck masses, no thyroid  tenderness, no thyromegaly RESPIRATORY: breathing is even & unlabored, BS CTAB CARDIAC: RRR, no murmur,no extra heart sounds, no edema GI: abdomen soft, normal BS, no masses, no tenderness, no hepatomegaly, no splenomegaly PSYCHIATRIC: the patient is alert & oriented to person, affect & behavior appropriate  LABS/RADIOLOGY: Labs reviewed: 02/09/13 WBC 10.6 hemoglobin 8.3 hematocrit 26.8 sodium 134 potassium 3.9 glucose 116 BUN 9 creatinine 0.7 calcium 8.8 Basic Metabolic Panel:  Recent Labs  01/26/13 1040 02/02/13 0523 02/03/13 0508  NA 141 138 138  K 4.1 3.8 3.5*  CL 104 101 101  CO2 25 25 24   GLUCOSE 98 132* 138*  BUN 19 10 13   CREATININE 0.79 0.65 0.79  CALCIUM 9.3 8.6 8.4   Liver Function Tests:  Recent Labs  06/02/12 1025 01/26/13 1040  AST 17 15  ALT 11 17  ALKPHOS 66 65  BILITOT 0.4 0.3  PROT 7.1 7.4  ALBUMIN  --  3.9   CBC:  Recent Labs  06/02/12 1025  02/01/13 0645 02/02/13 0523 02/03/13 0508 02/04/13 0506  WBC 8.6  < > 13.4* 15.6* 15.1* 12.7*  NEUTROABS 5.8  --  9.0*  --   --   --   HGB 13.1  < > 13.8 10.3* 9.7* 9.5*  HCT 39.2  < > 40.7 31.4* 29.8* 28.9*  MCV 85  < > 86.6 86.5 87.9 87.8  PLT  --   < > 274 238 213 230  < > = values in this interval not displayed.  Lipid Panel:  Recent Labs  06/02/12 1025 12/26/12 0810  HDL 36* 31*   CBG:  Recent Labs  02/03/13 2123 02/04/13 0708 02/04/13 1056  GLUCAP 130* 125* 112*    ASSESSMENT/PLAN:  Osteoarthritis status post right total knee arthroplasty - for outpatient rehabilitation   Hypertension - well controlled; continue Amlodipine  Anemia - continue ferrous sulfate  Hyperlipidemia - continue pravastatin    I have filled out patient's discharge paperwork and written prescriptions.  Patient will have outpatient rehabilitation.  Total discharge time: Less than 30 minutes Discharge time involved coordination of the discharge process with Education officer, museum, nursing staff and therapy  department.    CPT CODE: 16109

## 2013-02-20 DIAGNOSIS — M25569 Pain in unspecified knee: Secondary | ICD-10-CM | POA: Diagnosis not present

## 2013-02-23 DIAGNOSIS — M25569 Pain in unspecified knee: Secondary | ICD-10-CM | POA: Diagnosis not present

## 2013-02-26 DIAGNOSIS — E1149 Type 2 diabetes mellitus with other diabetic neurological complication: Secondary | ICD-10-CM | POA: Insufficient documentation

## 2013-02-26 NOTE — Progress Notes (Signed)
Alison [name]              HISTORY & PHYSICAL  DATE: 02/15/2013   FACILITY: [facility]  LEVEL OF CARE: SNF (31)  ALLERGIES:  No Known Allergies  CHIEF COMPLAINT:  Manage right knee OA, acute blood loss anemia & HTN  HISTORY OF PRESENT ILLNESS: pt is a 68 y/o AA female:  KNEE OSTEOARTHRITIS: Patient had a history of pain and functional disability in the knee due to end-stage osteoarthritis and has failed nonsurgical conservative treatments. Patient had worsening of pain with activity and weight bearing, pain that interfered with activities of daily living & pain with passive range of motion. Therefore patient underwent total knee arthroplasty and tolerated the procedure well. Patient is admitted to this facility for sort short-term rehabilitation. Patient denies knee pain.  ANEMIA: The anemia has been stable. The patient denies fatigue, melena or hematochezia. No complications from the medications currently being used.  Last Hb 10.3, 9.7.  HTN: Pt 's HTN remains stable.  Denies CP, sob, DOE, pedal edema, headaches, dizziness or visual disturbances.  No complications from the medications currently being used.  Last BP :sHT currently being used.  Last BP :129/70.  PAST MEDICAL HISTORY : Past Medical History  Diagnosis Date  . Unspecified vitamin D deficiency   . Obesity, unspecified   . Goiter, specified as simple   . Pernicious anemia   . Hyperlipidemia LDL goal < 100   . Unspecified glaucoma   . Benign essential hypertension   . Diverticulitis of colon (without mention of hemorrhage)   . Osteoarthrosis, unspecified whether generalized or localized, lower leg   . Osteopenia   . Numbness     TOES RT FOOT  . Diabetes mellitus without complication   . Other specified disease of nail     FUNGUS OF FINGERNAILS    PAST SURGICAL HISTORY: Past Surgical History  Procedure Laterality Date  . Colectomy  2009  . Total knee arthroplasty Right 02/01/2013    Procedure: RIGHT  TOTAL KNEE ARTHROPLASTY;  Surgeon: Tobi Bastos, MD;  Location: [facility];  Service: Orthopedics;  Laterality: Right;    SOCIAL HISTORY:  reports that she has been smoking.  She does not have any smokeless tobacco history on file. She reports that she does not drink alcohol or use illicit drugs.  FAMILY HISTORY:  Family History  Problem Relation Age of Onset  . Diabetes Sister     CURRENT MEDICATIONS: Reviewed per [facility]  REVIEW OF SYSTEMS:  See HPI otherwise 14 point ROS is negative.  PHYSICAL EXAMINATION  VS:  T98       P 84   RR 20    BP 129/70    GENERAL: no acute distress, moderately obese body habitus EYES: conjunctivae normal, sclerae normal, normal eye lids MOUTH/THROAT: lips without lesions,no lesions in the mouth,tongue is without lesions,uvula elevates in midline NECK: supple, trachea midline, no neck masses, no thyroid tenderness, no thyromegaly LYMPHATICS: no LAN in the neck, no supraclavicular LAN RESPIRATORY: breathing is even & unlabored, BS CTAB CARDIAC: RRR, no murmur,no extra heart sounds, RLE +2 edema GI:  ABDOMEN: abdomen soft, normal BS, no masses, no tenderness  LIVER/SPLEEN: no hepatomegaly, no splenomegaly MUSCULOSKELETAL: HEAD: normal to inspection & palpation BACK: no kyphosis, scoliosis or spinal processes tenderness EXTREMITIES: LEFT UPPER EXTREMITY: full range of motion, normal strength & tone RIGHT UPPER EXTREMITY:  full range of motion, normal strength & tone LEFT LOWER EXTREMITY:  full range of motion,  normal strength & tone RIGHT LOWER EXTREMITY:  range of motion not tested due to surgery, normal strength & tone PSYCHIATRIC: the patient is alert & oriented to person, affect & behavior appropriate  LABS/RADIOLOGY:  02-09-13 glc 116 ow bmp nl, wbc 10.6, Hb 8.3, mcv 89, plt497  Labs reviewed: Basic Metabolic Panel:  Recent Labs  01/26/13 1040 02/02/13 0523 02/03/13 0508  NA 141 138 138  K 4.1 3.8 3.5*  CL 104 101 101  CO2 25 25 24     GLUCOSE 98 132* 138*  BUN 19 10 13   CREATININE 0.79 0.65 0.79  CALCIUM 9.3 8.6 8.4   Liver Function Tests:  Recent Labs  06/02/12 1025 01/26/13 1040  AST 17 15  ALT 11 17  ALKPHOS 66 65  BILITOT 0.4 0.3  PROT 7.1 7.4  ALBUMIN  --  3.9   CBC:  Recent Labs  06/02/12 1025  02/01/13 0645 02/02/13 0523 02/03/13 0508 02/04/13 0506  WBC 8.6  < > 13.4* 15.6* 15.1* 12.7*  NEUTROABS 5.8  --  9.0*  --   --   --   HGB 13.1  < > 13.8 10.3* 9.7* 9.5*  HCT 39.2  < > 40.7 31.4* 29.8* 28.9*  MCV 85  < > 86.6 86.5 87.9 87.8  PLT  --   < > 274 238 213 230  < > = values in this interval not displayed.  Lipid Panel:  Recent Labs  06/02/12 1025 12/26/12 0810  HDL 36* 31*   CBG:  Recent Labs  02/03/13 2123 02/04/13 0708 02/04/13 1056  GLUCAP 130* 125* 112*   CHEST  2 VIEW   FINDINGS: Mild increased density noted right lung base. This may be from poor inspiration and mild atelectasis. Mild pneumonia cannot be excluded. A follow-up chest x-ray should be considered. Lungs are otherwise clear. No pleural effusion or pneumothorax. Heart size normal. No acute bony abnormality.   IMPRESSION: Mild increased density right lung base, possibly from mild atelectasis. To exclude pneumonia follow-up chest x-ray suggested.   CT CHEST WITHOUT CONTRAST   TECHNIQUE: Multidetector CT imaging of the chest was performed following the standard protocol without IV contrast.   COMPARISON:  01/26/2013 chest x-ray.   FINDINGS: Bibasilar parenchymal changes have an appearance most suggestive of bibasilar subsegmental atelectasis. Infectious infiltrate felt to be a less likely consideration although component of mild bronchitis not excluded.   Other areas of minimal parenchymal changes most suggestive of scarring. There is one region within the right upper lobe (602 image 50) which has a slightly ground-glass appearance. Given the patient's history of smoking, followup unenhanced CT  of the chest in 3 months recommended for further delineation of this finding.   Heart size within normal limits.  Coronary artery calcifications.   Atherosclerotic type changes of the thoracic aorta. Ascending thoracic aorta slightly ectatic measuring up to 3.6 cm. Atherosclerotic type changes origin of the great vessels.   No mediastinal, hilar are or axillary adenopathy   Mild thoracic scoliosis without bony destructive lesion.   Unenhanced imaging of upper abdominal structures unremarkable.   IMPRESSION: Bibasilar parenchymal changes have an appearance most suggestive of bibasilar subsegmental atelectasis. Infectious infiltrate felt to be a less likely consideration although component of mild bronchitis not excluded.   Other areas of minimal parenchymal changes most suggestive of scarring. There is one region within the right upper lobe (602 image 50) which has a slightly ground-glass appearance. Given the patient's history of smoking, followup unenhanced CT of the  chest in 3 months recommended for further delineation of this finding.   Atherosclerotic type changes as detailed above.    ASSESSMENT/PLAN:  Right knee OA-s/p total knee arthroplasty. Acute blood loss anemia- recheck pending. HTN-well controlled. DM-cont current medications Hyperlipidemia-cont. pravastatin  I have reviewed patient's medical records received at admission/from hospitalization.  CPT CODE: 76160

## 2013-03-03 DIAGNOSIS — M25569 Pain in unspecified knee: Secondary | ICD-10-CM | POA: Diagnosis not present

## 2013-03-07 DIAGNOSIS — Z96659 Presence of unspecified artificial knee joint: Secondary | ICD-10-CM | POA: Diagnosis not present

## 2013-03-07 DIAGNOSIS — M25569 Pain in unspecified knee: Secondary | ICD-10-CM | POA: Diagnosis not present

## 2013-03-10 DIAGNOSIS — M25569 Pain in unspecified knee: Secondary | ICD-10-CM | POA: Diagnosis not present

## 2013-03-14 DIAGNOSIS — M25569 Pain in unspecified knee: Secondary | ICD-10-CM | POA: Diagnosis not present

## 2013-03-17 DIAGNOSIS — M545 Low back pain, unspecified: Secondary | ICD-10-CM | POA: Diagnosis not present

## 2013-03-20 DIAGNOSIS — M545 Low back pain, unspecified: Secondary | ICD-10-CM | POA: Diagnosis not present

## 2013-03-23 DIAGNOSIS — M545 Low back pain, unspecified: Secondary | ICD-10-CM | POA: Diagnosis not present

## 2013-03-24 ENCOUNTER — Encounter (HOSPITAL_COMMUNITY): Payer: Self-pay | Admitting: Pharmacy Technician

## 2013-03-24 NOTE — Progress Notes (Signed)
Surgery on 03/30/13.  Need orders in EPIC.  Thank You.  

## 2013-03-27 ENCOUNTER — Other Ambulatory Visit: Payer: Self-pay | Admitting: Internal Medicine

## 2013-03-27 NOTE — Progress Notes (Signed)
Surgery on 03/30/13.  Preop on 03/28/13 at 200pm.  Need orders in EPIC.  Thank You.

## 2013-03-28 ENCOUNTER — Encounter (HOSPITAL_COMMUNITY)
Admission: RE | Admit: 2013-03-28 | Discharge: 2013-03-28 | Disposition: A | Discharge status: Home / Self Care | Payer: Medicare Other | Source: Ambulatory Visit | Attending: Orthopedic Surgery | Admitting: Orthopedic Surgery

## 2013-03-28 ENCOUNTER — Other Ambulatory Visit (HOSPITAL_COMMUNITY): Payer: Self-pay | Admitting: Orthopedic Surgery

## 2013-03-28 ENCOUNTER — Encounter (HOSPITAL_COMMUNITY): Payer: Self-pay

## 2013-03-28 DIAGNOSIS — E785 Hyperlipidemia, unspecified: Secondary | ICD-10-CM | POA: Diagnosis not present

## 2013-03-28 DIAGNOSIS — M48061 Spinal stenosis, lumbar region without neurogenic claudication: Secondary | ICD-10-CM | POA: Diagnosis not present

## 2013-03-28 DIAGNOSIS — Z79899 Other long term (current) drug therapy: Secondary | ICD-10-CM | POA: Diagnosis not present

## 2013-03-28 DIAGNOSIS — Z6831 Body mass index (BMI) 31.0-31.9, adult: Secondary | ICD-10-CM | POA: Diagnosis not present

## 2013-03-28 DIAGNOSIS — K5732 Diverticulitis of large intestine without perforation or abscess without bleeding: Secondary | ICD-10-CM | POA: Diagnosis not present

## 2013-03-28 DIAGNOSIS — E669 Obesity, unspecified: Secondary | ICD-10-CM | POA: Diagnosis present

## 2013-03-28 DIAGNOSIS — I10 Essential (primary) hypertension: Secondary | ICD-10-CM | POA: Diagnosis not present

## 2013-03-28 DIAGNOSIS — Z833 Family history of diabetes mellitus: Secondary | ICD-10-CM | POA: Diagnosis not present

## 2013-03-28 DIAGNOSIS — Z01818 Encounter for other preprocedural examination: Secondary | ICD-10-CM | POA: Diagnosis not present

## 2013-03-28 DIAGNOSIS — F17200 Nicotine dependence, unspecified, uncomplicated: Secondary | ICD-10-CM | POA: Diagnosis present

## 2013-03-28 DIAGNOSIS — E559 Vitamin D deficiency, unspecified: Secondary | ICD-10-CM | POA: Diagnosis not present

## 2013-03-28 DIAGNOSIS — M216X9 Other acquired deformities of unspecified foot: Secondary | ICD-10-CM | POA: Diagnosis present

## 2013-03-28 DIAGNOSIS — M7130 Other bursal cyst, unspecified site: Secondary | ICD-10-CM | POA: Diagnosis not present

## 2013-03-28 DIAGNOSIS — M949 Disorder of cartilage, unspecified: Secondary | ICD-10-CM | POA: Diagnosis present

## 2013-03-28 DIAGNOSIS — Z96659 Presence of unspecified artificial knee joint: Secondary | ICD-10-CM | POA: Diagnosis not present

## 2013-03-28 DIAGNOSIS — H409 Unspecified glaucoma: Secondary | ICD-10-CM | POA: Diagnosis present

## 2013-03-28 DIAGNOSIS — M5137 Other intervertebral disc degeneration, lumbosacral region: Secondary | ICD-10-CM | POA: Diagnosis not present

## 2013-03-28 DIAGNOSIS — M899 Disorder of bone, unspecified: Secondary | ICD-10-CM | POA: Diagnosis present

## 2013-03-28 DIAGNOSIS — M4310 Spondylolisthesis, site unspecified: Secondary | ICD-10-CM | POA: Diagnosis not present

## 2013-03-28 DIAGNOSIS — M48062 Spinal stenosis, lumbar region with neurogenic claudication: Secondary | ICD-10-CM | POA: Diagnosis present

## 2013-03-28 DIAGNOSIS — E119 Type 2 diabetes mellitus without complications: Secondary | ICD-10-CM | POA: Diagnosis not present

## 2013-03-28 LAB — CBC
HCT: 37.3 % (ref 36.0–46.0)
HEMOGLOBIN: 12.2 g/dL (ref 12.0–15.0)
MCH: 27.1 pg (ref 26.0–34.0)
MCHC: 32.7 g/dL (ref 30.0–36.0)
MCV: 82.7 fL (ref 78.0–100.0)
Platelets: 382 10*3/uL (ref 150–400)
RBC: 4.51 MIL/uL (ref 3.87–5.11)
RDW: 14.8 % (ref 11.5–15.5)
WBC: 14 10*3/uL — ABNORMAL HIGH (ref 4.0–10.5)

## 2013-03-28 LAB — BASIC METABOLIC PANEL
BUN: 12 mg/dL (ref 6–23)
CALCIUM: 10 mg/dL (ref 8.4–10.5)
CO2: 26 meq/L (ref 19–32)
CREATININE: 0.74 mg/dL (ref 0.50–1.10)
Chloride: 99 mEq/L (ref 96–112)
GFR calc Af Amer: >90 mL/min (ref >90)
GFR calc non Af Amer: 85 mL/min — ABNORMAL LOW (ref >90)
GLUCOSE: 114 mg/dL — AB (ref 70–99)
Potassium: 4.1 mEq/L (ref 3.7–5.3)
Sodium: 137 mEq/L (ref 137–147)

## 2013-03-28 NOTE — Telephone Encounter (Signed)
Patient with recent knee replacement and pending back surgery this week, patient will call once recovered from back surgery

## 2013-03-28 NOTE — Progress Notes (Signed)
Chest ct 02-02-13 epic ekg 01-27-12 epic

## 2013-03-28 NOTE — Progress Notes (Signed)
Pt for pre op today , need orders fro 03-30-13 surgery  in epic before 200 pm today, thanks

## 2013-03-28 NOTE — Patient Instructions (Addendum)
[place]  03/28/2013   Your procedure is scheduled on: Thursday March 19th  Report to [facility] at 1100 AM.  Call this number if you have problems the morning of surgery [phone]   Remember:  Do not eat food:After Midnight.   clear liquids midnight until 730 am day of surgery, then nothing by mouth.   Take these medicines the morning of surgery with A SIP OF WATER: amlodipine, perccoet if needed                                [place] may not have any metal on your body including hair pins and piercings  Do not wear jewelry, make-up.  Do not wear lotions, powders, or perfumes. No  Deodorant is to be worn.   Men may shave face and neck.  Do not bring valuables to the hospital. [place].  Contacts, dentures or bridgework may not be worn into surgery.  Leave suitcase in the car. After surgery it may be brought to your room.  For patients admitted to the hospital, checkout time is 11:00 AM the day of discharge.   Please read over the following fact sheets that you were given: [facility] Preparing for surgery sheet,  incentive spirometer sheet,  MRSA information  Call Zelphia Cairo RN pre op nurse if needed [phone]    [place].  PATIENT SIGNATURE___________________________________________  NURSE SIGNATURE_____________________________________________

## 2013-03-28 NOTE — Progress Notes (Signed)
Cbc results faxed by epic to dr Gladstone Lighter

## 2013-03-29 LAB — SURGICAL PCR SCREEN
MRSA, PCR: INVALID — AB
Staphylococcus aureus: INVALID — AB

## 2013-03-29 NOTE — H&P (Signed)
Alison [name] is an 68 y.o. female.   Chief Complaint: low back pain HPI: Alison [name] is a 68 year old female who presented with the chief complaint of low back pain. She denies injury but starting having significant low back pain radiating into the left LE about a month ago. She recently underwent a right TKA from which she is recovering well. She denies numbness and tingling in the legs. No change in bladder or bowel function. She is having pain at all times, including sitting, standing, and lying down. MRI of the lumbar spine revealed a synovial cyst at L4-L5 on the left.   Past Medical History  Diagnosis Date  . Unspecified vitamin D deficiency   . Obesity, unspecified   . Goiter, specified as simple   . Pernicious anemia   . Hyperlipidemia LDL goal < 100   . Unspecified glaucoma   . Benign essential hypertension   . Diverticulitis of colon (without mention of hemorrhage)   . Osteoarthrosis, unspecified whether generalized or localized, lower leg   . Osteopenia   . Numbness     TOES RT FOOT and toes left foot  . Diabetes mellitus without complication   . Other specified disease of nail     FUNGUS OF FINGERNAILS    Past Surgical History  Procedure Laterality Date  . Total knee arthroplasty Right 02/01/2013    Procedure: RIGHT TOTAL KNEE ARTHROPLASTY;  Surgeon: Tobi Bastos, MD;  Location: [facility];  Service: Orthopedics;  Laterality: Right;  . Colectomy  2009    Family History  Problem Relation Age of Onset  . Diabetes Sister    Social History:  reports that she has been smoking.  She has never used smokeless tobacco. She reports that she does not drink alcohol or use illicit drugs.  Allergies: No Known Allergies   Current outpatient prescriptions: amLODipine (NORVASC) 10 MG tablet, Take 10 mg by mouth daily with breakfast. , Disp: , Rfl: ;   carboxymethylcellulose (REFRESH PLUS) 0.5 % SOLN, Place 1 drop into both eyes 3 (three) times daily as needed (dry eyes). , Disp: ,  Rfl: ;   celecoxib (CELEBREX) 200 MG capsule, Take 200 mg by mouth daily., Disp: , Rfl:  ferrous sulfate 325 (65 FE) MG tablet, Take 325 mg by mouth 2 (two) times daily with a meal., Disp: , Rfl: ;   latanoprost (XALATAN) 0.005 % ophthalmic solution, INSTILL 1 DROP IN BOTH EYES EVERY DAY AT BEDTIME, Disp: 2.5 mL, Rfl: 1;   metFORMIN (GLUCOPHAGE) 500 MG tablet, Take 250 mg by mouth daily with supper., Disp: , Rfl: ;   methocarbamol (ROBAXIN) 500 MG tablet, Take 500 mg by mouth 3 (three) times daily., Disp: , Rfl:  Multiple Vitamin (MULTIVITAMIN WITH MINERALS) TABS tablet, Take 1 tablet by mouth daily., Disp: , Rfl: ;   omega-3 acid ethyl esters (LOVAZA) 1 G capsule, Take 2 g by mouth daily., Disp: , Rfl: ;   oxyCODONE-acetaminophen (PERCOCET) 10-325 MG per tablet, Take 1 tablet by mouth every 4 (four) hours as needed for pain., Disp: , Rfl: ;   pravastatin (PRAVACHOL) 80 MG tablet, Take 80 mg by mouth every morning. , Disp: , Rfl:   Results for orders placed during the hospital encounter of 03/28/13 (from the past 48 hour(s))  SURGICAL PCR SCREEN     Status: Abnormal   Collection Time    03/28/13  2:19 PM      Result Value Ref Range   MRSA, PCR RESULT CALLED TO,  READ BACK BY AND VERIFIED WITH: (*) NEGATIVE   Comment: AFTER HOURS 2045 [id number] [place],N   Staphylococcus aureus RESULT CALLED TO, READ BACK BY AND VERIFIED WITH: (*) NEGATIVE   Comment: AFTER HOURS 2045 [id number] [place],N                The Xpert SA Assay (FDA     approved for NASAL specimens     in patients over 76 years of age),     is one component of     a comprehensive surveillance     program.  Test performance has     been validated by [employer] for patients greater     than or equal to 43 year old.     It is not intended     to diagnose infection nor to     guide or monitor treatment.  CBC     Status: Abnormal   Collection Time    03/28/13  2:25 PM      Result Value Ref Range   WBC 14.0 (*) 4.0 - 10.5  K/uL   RBC 4.51  3.87 - 5.11 MIL/uL   Hemoglobin 12.2  12.0 - 15.0 g/dL   HCT 37.3  36.0 - 46.0 %   MCV 82.7  78.0 - 100.0 fL   MCH 27.1  26.0 - 34.0 pg   MCHC 32.7  30.0 - 36.0 g/dL   RDW 14.8  11.5 - 15.5 %   Platelets 382  150 - 400 K/uL  BASIC METABOLIC PANEL     Status: Abnormal   Collection Time    03/28/13  2:25 PM      Result Value Ref Range   Sodium 137  137 - 147 mEq/L   Potassium 4.1  3.7 - 5.3 mEq/L   Chloride 99  96 - 112 mEq/L   CO2 26  19 - 32 mEq/L   Glucose, Bld 114 (*) 70 - 99 mg/dL   BUN 12  6 - 23 mg/dL   Creatinine, Ser 0.74  0.50 - 1.10 mg/dL   Calcium 10.0  8.4 - 10.5 mg/dL   GFR calc non Af Amer 85 (*) >90 mL/min   GFR calc Af Amer >90  >90 mL/min   Comment: (NOTE)     The eGFR has been calculated using the CKD EPI equation.     This calculation has not been validated in all clinical situations.     eGFR's persistently <90 mL/min signify possible Chronic Kidney     Disease.    Review of Systems  Constitutional: Negative.   HENT: Negative.   Eyes: Negative.   Respiratory: Negative.   Cardiovascular: Negative.   Gastrointestinal: Negative.   Genitourinary: Negative.   Musculoskeletal: Positive for back pain, joint pain and myalgias. Negative for falls and neck pain.  Skin: Negative.   Neurological: Negative.   Endo/Heme/Allergies: Negative.   Psychiatric/Behavioral: Negative.     Physical Exam  Constitutional: She is oriented to person, place, and time. She appears well-developed and well-nourished. No distress.  HENT:  Head: Normocephalic and atraumatic.  Right Ear: External ear normal.  Left Ear: External ear normal.  Nose: Nose normal.  Mouth/Throat: Oropharynx is clear and moist.  Eyes: Conjunctivae and EOM are normal.  Neck: Normal range of motion. Neck supple.  Cardiovascular: Normal rate, regular rhythm, normal heart sounds and intact distal pulses.   No murmur heard. Respiratory: Effort normal. No respiratory distress. She has no  wheezes.  GI: Soft. Bowel sounds are normal. She exhibits no distension. There is no tenderness.  Musculoskeletal:       Right hip: Normal.       Left hip: Normal.       Right knee: She exhibits decreased range of motion and swelling. She exhibits no effusion and no erythema. Tenderness found.       Left knee: Normal.       Lumbar back: She exhibits decreased range of motion, tenderness, pain and spasm.       Back:       Right lower leg: She exhibits no tenderness and no swelling.       Left lower leg: She exhibits no tenderness and no swelling.       Legs: Positive SLR on the left  Neurological: She is alert and oriented to person, place, and time. She has normal strength and normal reflexes. No sensory deficit.  Skin: No rash noted. She is not diaphoretic. No erythema.  Psychiatric: She has a normal mood and affect. Her behavior is normal.     Assessment/Plan Lumbar synovial cyst, L4-L5, left She has a synovial cyst at L4-L5 on the left. She needs a central decompression with excision of synovial cyst at L4-L5 on the left. Risks and benefits of the procedure were discussed with the patient by Dr. Gladstone Lighter. The possible complications of spinal surgery number one could be infection, which is extremely rare. We do use antibiotics prior to the surgery and during surgery and after surgery. Number two is always a slight degree of probability that you could develop a blood clot in your leg after any type of surgery and we try our best to prevent that with aspirin post op when it is safe to begin. The third is a dural leak. That is the spinal fluid leak that could occur. At certain rare times the bone or the disc could literally stick to the dura which is the lining which contains the spinal fluid and we could develop a small tear in that lining which we then patch up. That is an extremely rare complication. The last and final complication is a recurrent disc rupture. That means that you could rupture  another small piece of disc later on down the road and there is about a 2% chance of that.    Alison [name], [place] 03/29/2013, 9:10 AM

## 2013-03-30 ENCOUNTER — Ambulatory Visit (HOSPITAL_COMMUNITY): Payer: Medicare Other

## 2013-03-30 ENCOUNTER — Other Ambulatory Visit: Payer: Medicare Other

## 2013-03-30 ENCOUNTER — Ambulatory Visit (HOSPITAL_COMMUNITY): Payer: Medicare Other | Admitting: Anesthesiology

## 2013-03-30 ENCOUNTER — Encounter (HOSPITAL_COMMUNITY): Payer: Medicare Other | Admitting: Anesthesiology

## 2013-03-30 ENCOUNTER — Inpatient Hospital Stay (HOSPITAL_COMMUNITY)
Admission: RE | Admit: 2013-03-30 | Discharge: 2013-03-31 | DRG: 520 | Disposition: A | Discharge status: Home / Self Care | Payer: Medicare Other | Source: Ambulatory Visit | Attending: Orthopedic Surgery | Admitting: Orthopedic Surgery

## 2013-03-30 ENCOUNTER — Encounter (HOSPITAL_COMMUNITY): Payer: Self-pay | Admitting: *Deleted

## 2013-03-30 ENCOUNTER — Encounter (HOSPITAL_COMMUNITY)
Admission: RE | Disposition: A | Discharge status: Home / Self Care | Payer: Self-pay | Source: Ambulatory Visit | Attending: Orthopedic Surgery

## 2013-03-30 DIAGNOSIS — M216X9 Other acquired deformities of unspecified foot: Secondary | ICD-10-CM | POA: Diagnosis present

## 2013-03-30 DIAGNOSIS — M5137 Other intervertebral disc degeneration, lumbosacral region: Secondary | ICD-10-CM | POA: Diagnosis not present

## 2013-03-30 DIAGNOSIS — M48062 Spinal stenosis, lumbar region with neurogenic claudication: Secondary | ICD-10-CM

## 2013-03-30 DIAGNOSIS — M48061 Spinal stenosis, lumbar region without neurogenic claudication: Secondary | ICD-10-CM | POA: Diagnosis not present

## 2013-03-30 DIAGNOSIS — Z96659 Presence of unspecified artificial knee joint: Secondary | ICD-10-CM

## 2013-03-30 DIAGNOSIS — Z6831 Body mass index (BMI) 31.0-31.9, adult: Secondary | ICD-10-CM

## 2013-03-30 DIAGNOSIS — Z01818 Encounter for other preprocedural examination: Secondary | ICD-10-CM | POA: Diagnosis not present

## 2013-03-30 DIAGNOSIS — M7138 Other bursal cyst, other site: Secondary | ICD-10-CM | POA: Diagnosis present

## 2013-03-30 DIAGNOSIS — M949 Disorder of cartilage, unspecified: Secondary | ICD-10-CM

## 2013-03-30 DIAGNOSIS — E785 Hyperlipidemia, unspecified: Secondary | ICD-10-CM | POA: Diagnosis present

## 2013-03-30 DIAGNOSIS — M7130 Other bursal cyst, unspecified site: Principal | ICD-10-CM | POA: Diagnosis present

## 2013-03-30 DIAGNOSIS — I10 Essential (primary) hypertension: Secondary | ICD-10-CM | POA: Diagnosis present

## 2013-03-30 DIAGNOSIS — E669 Obesity, unspecified: Secondary | ICD-10-CM | POA: Diagnosis present

## 2013-03-30 DIAGNOSIS — F17200 Nicotine dependence, unspecified, uncomplicated: Secondary | ICD-10-CM | POA: Diagnosis present

## 2013-03-30 DIAGNOSIS — M4310 Spondylolisthesis, site unspecified: Secondary | ICD-10-CM | POA: Diagnosis not present

## 2013-03-30 DIAGNOSIS — E119 Type 2 diabetes mellitus without complications: Secondary | ICD-10-CM | POA: Diagnosis present

## 2013-03-30 DIAGNOSIS — K5732 Diverticulitis of large intestine without perforation or abscess without bleeding: Secondary | ICD-10-CM | POA: Diagnosis not present

## 2013-03-30 DIAGNOSIS — Z79899 Other long term (current) drug therapy: Secondary | ICD-10-CM

## 2013-03-30 DIAGNOSIS — M899 Disorder of bone, unspecified: Secondary | ICD-10-CM | POA: Diagnosis present

## 2013-03-30 DIAGNOSIS — E559 Vitamin D deficiency, unspecified: Secondary | ICD-10-CM | POA: Diagnosis present

## 2013-03-30 DIAGNOSIS — H409 Unspecified glaucoma: Secondary | ICD-10-CM | POA: Diagnosis present

## 2013-03-30 DIAGNOSIS — Z833 Family history of diabetes mellitus: Secondary | ICD-10-CM

## 2013-03-30 HISTORY — PX: LUMBAR LAMINECTOMY/DECOMPRESSION MICRODISCECTOMY: SHX5026

## 2013-03-30 LAB — GLUCOSE, CAPILLARY
GLUCOSE-CAPILLARY: 123 mg/dL — AB (ref 70–99)
Glucose-Capillary: 137 mg/dL — ABNORMAL HIGH (ref 70–99)
Glucose-Capillary: 132 mg/dL — ABNORMAL HIGH (ref 70–99)
Glucose-Capillary: 124 mg/dL — ABNORMAL HIGH (ref 70–99)

## 2013-03-30 SURGERY — LUMBAR LAMINECTOMY/DECOMPRESSION MICRODISCECTOMY
Anesthesia: General | Site: Back

## 2013-03-30 MED ORDER — PROPOFOL 10 MG/ML IV BOLUS
INTRAVENOUS | Status: AC
  Filled 2013-03-30: qty 20

## 2013-03-30 MED ORDER — METHOCARBAMOL 100 MG/ML IJ SOLN
500.0000 mg | Freq: Four times a day (QID) | INTRAVENOUS | Status: DC | PRN
  Administered 2013-03-30: 500 mg via INTRAVENOUS
  Filled 2013-03-30: qty 5

## 2013-03-30 MED ORDER — SIMVASTATIN 5 MG PO TABS
5.0000 mg | ORAL_TABLET | Freq: Every day | ORAL | Status: DC
  Filled 2013-03-30 (×2): qty 1

## 2013-03-30 MED ORDER — ROCURONIUM BROMIDE 100 MG/10ML IV SOLN
INTRAVENOUS | Status: DC | PRN
  Administered 2013-03-30: 5 mg via INTRAVENOUS
  Administered 2013-03-30: 50 mg via INTRAVENOUS
  Administered 2013-03-30: 5 mg via INTRAVENOUS

## 2013-03-30 MED ORDER — ONDANSETRON HCL 4 MG/2ML IJ SOLN
INTRAMUSCULAR | Status: AC
  Filled 2013-03-30: qty 2

## 2013-03-30 MED ORDER — CEFAZOLIN SODIUM-DEXTROSE 2-3 GM-% IV SOLR
2.0000 g | INTRAVENOUS | Status: AC
  Administered 2013-03-30: 2 g via INTRAVENOUS

## 2013-03-30 MED ORDER — THROMBIN 5000 UNITS EX SOLR
CUTANEOUS | Status: AC
  Filled 2013-03-30: qty 5000

## 2013-03-30 MED ORDER — ACETAMINOPHEN 325 MG PO TABS: 650.0000 mg | ORAL_TABLET | ORAL | Status: DC | PRN

## 2013-03-30 MED ORDER — HYDROMORPHONE HCL PF 1 MG/ML IJ SOLN: 0.5000 mg | INTRAMUSCULAR | Status: DC | PRN

## 2013-03-30 MED ORDER — BACITRACIN-NEOMYCIN-POLYMYXIN 400-5-5000 EX OINT
TOPICAL_OINTMENT | CUTANEOUS | Status: DC | PRN
  Administered 2013-03-30: 1 via TOPICAL

## 2013-03-30 MED ORDER — HYDROMORPHONE HCL PF 1 MG/ML IJ SOLN
INTRAMUSCULAR | Status: AC
  Filled 2013-03-30: qty 1

## 2013-03-30 MED ORDER — BUPIVACAINE-EPINEPHRINE PF 0.5-1:200000 % IJ SOLN
INTRAMUSCULAR | Status: AC
  Filled 2013-03-30: qty 30

## 2013-03-30 MED ORDER — ACETAMINOPHEN 650 MG RE SUPP: 650.0000 mg | RECTAL | Status: DC | PRN

## 2013-03-30 MED ORDER — HYDROMORPHONE HCL PF 2 MG/ML IJ SOLN
INTRAMUSCULAR | Status: AC
  Filled 2013-03-30: qty 1

## 2013-03-30 MED ORDER — METFORMIN HCL 500 MG PO TABS
250.0000 mg | ORAL_TABLET | Freq: Every day | ORAL | Status: DC
  Administered 2013-03-30: 250 mg via ORAL
  Filled 2013-03-30 (×2): qty 1

## 2013-03-30 MED ORDER — FENTANYL CITRATE 0.05 MG/ML IJ SOLN
INTRAMUSCULAR | Status: DC | PRN
  Administered 2013-03-30 (×3): 50 ug via INTRAVENOUS
  Administered 2013-03-30: 100 ug via INTRAVENOUS

## 2013-03-30 MED ORDER — PHENYLEPHRINE HCL 10 MG/ML IJ SOLN
INTRAMUSCULAR | Status: DC | PRN
  Administered 2013-03-30 (×5): 80 ug via INTRAVENOUS

## 2013-03-30 MED ORDER — CELECOXIB 200 MG PO CAPS
200.0000 mg | ORAL_CAPSULE | Freq: Every day | ORAL | Status: DC
  Administered 2013-03-30 – 2013-03-31 (×2): 200 mg via ORAL
  Filled 2013-03-30 (×2): qty 1

## 2013-03-30 MED ORDER — HYDROMORPHONE HCL PF 1 MG/ML IJ SOLN
INTRAMUSCULAR | Status: DC | PRN
  Administered 2013-03-30 (×3): 0.5 mg via INTRAVENOUS

## 2013-03-30 MED ORDER — GLYCOPYRROLATE 0.2 MG/ML IJ SOLN
INTRAMUSCULAR | Status: DC | PRN
  Administered 2013-03-30: .8 mg via INTRAVENOUS

## 2013-03-30 MED ORDER — LATANOPROST 0.005 % OP SOLN
1.0000 [drp] | Freq: Every day | OPHTHALMIC | Status: DC
  Filled 2013-03-30: qty 2.5

## 2013-03-30 MED ORDER — PHENOL 1.4 % MT LIQD: 1.0000 | OROMUCOSAL | Status: DC | PRN

## 2013-03-30 MED ORDER — LACTATED RINGERS IV SOLN
INTRAVENOUS | Status: DC
  Administered 2013-03-30: 1000 mL via INTRAVENOUS

## 2013-03-30 MED ORDER — POLYETHYLENE GLYCOL 3350 17 G PO PACK: 17.0000 g | PACK | Freq: Every day | ORAL | Status: DC | PRN

## 2013-03-30 MED ORDER — BUPIVACAINE-EPINEPHRINE PF 0.5-1:200000 % IJ SOLN
INTRAMUSCULAR | Status: DC | PRN
  Administered 2013-03-30: 20 mL

## 2013-03-30 MED ORDER — THROMBIN 5000 UNITS EX SOLR
CUTANEOUS | Status: DC | PRN
  Administered 2013-03-30: 10000 [IU] via TOPICAL

## 2013-03-30 MED ORDER — LIDOCAINE HCL (CARDIAC) 20 MG/ML IV SOLN
INTRAVENOUS | Status: AC
  Filled 2013-03-30: qty 5

## 2013-03-30 MED ORDER — OXYCODONE-ACETAMINOPHEN 5-325 MG PO TABS
1.0000 | ORAL_TABLET | ORAL | Status: DC | PRN
  Administered 2013-03-30 – 2013-03-31 (×5): 2 via ORAL
  Filled 2013-03-30 (×4): qty 2

## 2013-03-30 MED ORDER — PROPOFOL 10 MG/ML IV BOLUS
INTRAVENOUS | Status: DC | PRN
Start: 2013-03-30 — End: 2013-03-30
  Administered 2013-03-30: 150 mg via INTRAVENOUS
  Administered 2013-03-30: 50 mg via INTRAVENOUS

## 2013-03-30 MED ORDER — ONDANSETRON HCL 4 MG/2ML IJ SOLN: 4.0000 mg | INTRAMUSCULAR | Status: DC | PRN

## 2013-03-30 MED ORDER — FERROUS SULFATE 325 (65 FE) MG PO TABS
325.0000 mg | ORAL_TABLET | Freq: Two times a day (BID) | ORAL | Status: DC
  Administered 2013-03-31: 325 mg via ORAL
  Filled 2013-03-30 (×3): qty 1

## 2013-03-30 MED ORDER — SODIUM CHLORIDE 0.9 % IR SOLN
Status: DC | PRN
  Administered 2013-03-30: 15:00:00

## 2013-03-30 MED ORDER — LACTATED RINGERS IV SOLN
INTRAVENOUS | Status: DC | PRN
  Administered 2013-03-30 (×2): via INTRAVENOUS

## 2013-03-30 MED ORDER — BISACODYL 10 MG RE SUPP: 10.0000 mg | Freq: Every day | RECTAL | Status: DC | PRN

## 2013-03-30 MED ORDER — PHENYLEPHRINE HCL 10 MG/ML IJ SOLN
10.0000 mg | INTRAVENOUS | Status: DC | PRN
  Administered 2013-03-30: 10 ug/min via INTRAVENOUS

## 2013-03-30 MED ORDER — PHENYLEPHRINE HCL 10 MG/ML IJ SOLN
INTRAMUSCULAR | Status: AC
  Filled 2013-03-30: qty 1

## 2013-03-30 MED ORDER — LACTATED RINGERS IV SOLN: INTRAVENOUS | Status: DC

## 2013-03-30 MED ORDER — FLEET ENEMA 7-19 GM/118ML RE ENEM
1.0000 | ENEMA | Freq: Once | RECTAL | Status: AC | PRN
Start: 2013-03-30 — End: 2013-03-30

## 2013-03-30 MED ORDER — ONDANSETRON HCL 4 MG/2ML IJ SOLN
INTRAMUSCULAR | Status: DC | PRN
  Administered 2013-03-30: 4 mg via INTRAVENOUS

## 2013-03-30 MED ORDER — METHOCARBAMOL 500 MG PO TABS
500.0000 mg | ORAL_TABLET | Freq: Four times a day (QID) | ORAL | Status: DC | PRN
  Administered 2013-03-30 – 2013-03-31 (×3): 500 mg via ORAL
  Filled 2013-03-30 (×3): qty 1

## 2013-03-30 MED ORDER — CEFAZOLIN SODIUM-DEXTROSE 2-3 GM-% IV SOLR
INTRAVENOUS | Status: AC
  Filled 2013-03-30: qty 50

## 2013-03-30 MED ORDER — LIDOCAINE HCL (CARDIAC) 20 MG/ML IV SOLN
INTRAVENOUS | Status: DC | PRN
  Administered 2013-03-30: 50 mg via INTRAVENOUS

## 2013-03-30 MED ORDER — NEOSTIGMINE METHYLSULFATE 1 MG/ML IJ SOLN
INTRAMUSCULAR | Status: DC | PRN
  Administered 2013-03-30: 4 mg via INTRAVENOUS

## 2013-03-30 MED ORDER — ESMOLOL HCL 10 MG/ML IV SOLN
INTRAVENOUS | Status: DC | PRN
  Administered 2013-03-30 (×2): 10 mg via INTRAVENOUS
  Administered 2013-03-30: 30 mg via INTRAVENOUS

## 2013-03-30 MED ORDER — HYDROCODONE-ACETAMINOPHEN 5-325 MG PO TABS: 1.0000 | ORAL_TABLET | ORAL | Status: DC | PRN

## 2013-03-30 MED ORDER — LACTATED RINGERS IV SOLN
INTRAVENOUS | Status: DC
  Administered 2013-03-30: 17:00:00 via INTRAVENOUS

## 2013-03-30 MED ORDER — INSULIN ASPART 100 UNIT/ML ~~LOC~~ SOLN: 0.0000 [IU] | Freq: Three times a day (TID) | SUBCUTANEOUS | Status: DC

## 2013-03-30 MED ORDER — ROCURONIUM BROMIDE 100 MG/10ML IV SOLN
INTRAVENOUS | Status: AC
  Filled 2013-03-30: qty 1

## 2013-03-30 MED ORDER — LACTATED RINGERS IV SOLN
INTRAVENOUS | Status: DC
  Administered 2013-03-30: 19:00:00 via INTRAVENOUS

## 2013-03-30 MED ORDER — BUPIVACAINE LIPOSOME 1.3 % IJ SUSP
20.0000 mL | Freq: Once | INTRAMUSCULAR | Status: AC
  Administered 2013-03-30: 20 mL
  Filled 2013-03-30: qty 20

## 2013-03-30 MED ORDER — MIDAZOLAM HCL 5 MG/5ML IJ SOLN
INTRAMUSCULAR | Status: DC | PRN
  Administered 2013-03-30 (×2): 1 mg via INTRAVENOUS

## 2013-03-30 MED ORDER — HYDROMORPHONE HCL PF 1 MG/ML IJ SOLN
0.2500 mg | INTRAMUSCULAR | Status: DC | PRN
  Administered 2013-03-30 (×4): 0.5 mg via INTRAVENOUS

## 2013-03-30 MED ORDER — FENTANYL CITRATE 0.05 MG/ML IJ SOLN
INTRAMUSCULAR | Status: AC
  Filled 2013-03-30: qty 5

## 2013-03-30 MED ORDER — CEFAZOLIN SODIUM 1-5 GM-% IV SOLN
1.0000 g | Freq: Three times a day (TID) | INTRAVENOUS | Status: DC
  Administered 2013-03-30 – 2013-03-31 (×2): 1 g via INTRAVENOUS
  Filled 2013-03-30 (×3): qty 50

## 2013-03-30 MED ORDER — MIDAZOLAM HCL 2 MG/2ML IJ SOLN
INTRAMUSCULAR | Status: AC
  Filled 2013-03-30: qty 2

## 2013-03-30 MED ORDER — AMLODIPINE BESYLATE 10 MG PO TABS
10.0000 mg | ORAL_TABLET | Freq: Every day | ORAL | Status: DC
  Administered 2013-03-31: 10 mg via ORAL
  Filled 2013-03-30 (×2): qty 1

## 2013-03-30 MED ORDER — MENTHOL 3 MG MT LOZG
1.0000 | LOZENGE | OROMUCOSAL | Status: DC | PRN
  Filled 2013-03-30: qty 9

## 2013-03-30 MED ORDER — BACITRACIN-NEOMYCIN-POLYMYXIN 400-5-5000 EX OINT
TOPICAL_OINTMENT | CUTANEOUS | Status: AC
  Filled 2013-03-30: qty 1

## 2013-03-30 SURGICAL SUPPLY — 43 items
BAG ZIPLOCK 12X15 (MISCELLANEOUS) ×6 IMPLANT
BENZOIN TINCTURE PRP APPL 2/3 (GAUZE/BANDAGES/DRESSINGS) ×3 IMPLANT
CLEANER TIP ELECTROSURG 2X2 (MISCELLANEOUS) ×3 IMPLANT
CONT SPECI 4OZ STER CLIK (MISCELLANEOUS) IMPLANT
DRAIN PENROSE 18X1/4 LTX STRL (WOUND CARE) IMPLANT
DRAPE MICROSCOPE LEICA (MISCELLANEOUS) ×3 IMPLANT
DRAPE POUCH INSTRU U-SHP 10X18 (DRAPES) ×3 IMPLANT
DRAPE SURG 17X11 SM STRL (DRAPES) ×3 IMPLANT
DRSG ADAPTIC 3X8 NADH LF (GAUZE/BANDAGES/DRESSINGS) ×3 IMPLANT
DRSG PAD ABDOMINAL 8X10 ST (GAUZE/BANDAGES/DRESSINGS) IMPLANT
DURAPREP 26ML APPLICATOR (WOUND CARE) ×3 IMPLANT
ELECT BLADE TIP CTD 4 INCH (ELECTRODE) ×3 IMPLANT
ELECT REM PT RETURN 9FT ADLT (ELECTROSURGICAL) ×3
ELECTRODE REM PT RTRN 9FT ADLT (ELECTROSURGICAL) ×1 IMPLANT
GLOVE BIOGEL PI IND STRL 8 (GLOVE) ×2 IMPLANT
GLOVE BIOGEL PI INDICATOR 8 (GLOVE) ×4
GLOVE ECLIPSE 8.0 STRL XLNG CF (GLOVE) ×9 IMPLANT
GOWN STRL REUS W/TWL LRG LVL3 (GOWN DISPOSABLE) ×3 IMPLANT
GOWN STRL REUS W/TWL XL LVL3 (GOWN DISPOSABLE) ×9 IMPLANT
KIT BASIN OR (CUSTOM PROCEDURE TRAY) ×3 IMPLANT
KIT POSITIONING SURG ANDREWS (MISCELLANEOUS) ×3 IMPLANT
MANIFOLD NEPTUNE II (INSTRUMENTS) ×3 IMPLANT
NEEDLE SPNL 18GX3.5 QUINCKE PK (NEEDLE) ×6 IMPLANT
NS IRRIG 1000ML POUR BTL (IV SOLUTION) IMPLANT
PAD ABD 8X10 STRL (GAUZE/BANDAGES/DRESSINGS) ×12 IMPLANT
PATTIES SURGICAL .5 X.5 (GAUZE/BANDAGES/DRESSINGS) IMPLANT
PATTIES SURGICAL .75X.75 (GAUZE/BANDAGES/DRESSINGS) IMPLANT
PATTIES SURGICAL 1X1 (DISPOSABLE) IMPLANT
PIN SAFETY NICK PLATE  2 MED (MISCELLANEOUS)
PIN SAFETY NICK PLATE 2 MED (MISCELLANEOUS) IMPLANT
POSITIONER SURGICAL ARM (MISCELLANEOUS) ×3 IMPLANT
SPONGE GAUZE 4X4 12PLY (GAUZE/BANDAGES/DRESSINGS) ×3 IMPLANT
SPONGE LAP 4X18 X RAY DECT (DISPOSABLE) ×3 IMPLANT
SPONGE SURGIFOAM ABS GEL 100 (HEMOSTASIS) ×3 IMPLANT
STAPLER VISISTAT 35W (STAPLE) ×3 IMPLANT
SUT VIC AB 0 CT1 27 (SUTURE) ×2
SUT VIC AB 0 CT1 27XBRD ANTBC (SUTURE) ×1 IMPLANT
SUT VIC AB 1 CT1 27 (SUTURE) ×6
SUT VIC AB 1 CT1 27XBRD ANTBC (SUTURE) ×3 IMPLANT
TAPE CLOTH SURG 6X10 WHT LF (GAUZE/BANDAGES/DRESSINGS) ×3 IMPLANT
TOWEL OR 17X26 10 PK STRL BLUE (TOWEL DISPOSABLE) ×6 IMPLANT
TRAY LAMINECTOMY (CUSTOM PROCEDURE TRAY) ×3 IMPLANT
WATER STERILE IRR 1500ML POUR (IV SOLUTION) IMPLANT

## 2013-03-30 NOTE — Brief Op Note (Signed)
03/30/2013  3:53 PM  PATIENT:  Alison [name]  68 y.o. 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  POST-OPERATIVE DIAGNOSIS:  lumbar synoval cyst L4 - L5 on the left with Severe Spinal Stenosis and a Foot Drop on the Left  PROCEDURE:  Procedure(s): CENTRAL DECOMPRESSION L4 - L5 AND EXCISION 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 (N/A) and TWO Level Foraminotomies for L-4 and L-5 Nerve Roots.Decompression of there Lateral recess for Spinal Stenosis.  SURGEON:  Surgeon(s) and Role:    * Magnus Sinning, MD - Assisting    * Tobi Bastos, MD - Primary    ASSISTANTS: Tarri Glenn MD  ANESTHESIA:   general  EBL:  Total I/O In: 1000 [I.V.:1000] Out: -   BLOOD ADMINISTERED:none  DRAINS: none   LOCAL MEDICATIONS USED:  MARCAINE 20cc of 0.50% with Epinephrine at start of Case and 20cc of Exparel at end of case.     SPECIMEN:  No Specimen  DISPOSITION OF SPECIMEN:  N/A  COUNTS:  YES  TOURNIQUET:  * No tourniquets in log *  DICTATION: .Other Dictation: Dictation Number [id number]  PLAN OF CARE: Admit for overnight observation  PATIENT DISPOSITION:  Stable in OR   Delay start of Pharmacological VTE agent (>24hrs) due to surgical blood loss or risk of bleeding: yes

## 2013-03-30 NOTE — Preoperative (Signed)
Beta Blockers   Reason not to administer Beta Blockers:Not Applicable 

## 2013-03-30 NOTE — Anesthesia Postprocedure Evaluation (Signed)
  Anesthesia Post-op Note  Patient: Alison [name]  Procedure(s) Performed: Procedure(s) (LRB): CENTRAL DECOMPRESSION L4 - L5 AND EXCISION 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 (N/A)  Patient Location: PACU  Anesthesia Type: General  Level of Consciousness: awake and alert   Airway and Oxygen Therapy: Patient Spontanous Breathing  Post-op Pain: mild  Post-op Assessment: Post-op Vital signs reviewed, Patient's Cardiovascular Status Stable, Respiratory Function Stable, Patent Airway and No signs of Nausea or vomiting  Last Vitals:  Filed Vitals:   03/30/13 1715  BP: 122/72  Pulse: 84  Temp: 36.8 C  Resp: 14    Post-op Vital Signs: stable   Complications: No apparent anesthesia complications

## 2013-03-30 NOTE — Transfer of Care (Signed)
Immediate Anesthesia Transfer of Care Note  Patient: Alison [name]  Procedure(s) Performed: Procedure(s) (LRB): CENTRAL DECOMPRESSION L4 - L5 AND EXCISION 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 (N/A)  Patient Location: PACU  Anesthesia Type: General  Level of Consciousness: sedated, patient cooperative and responds to stimulation  Airway & Oxygen Therapy: Patient Spontanous Breathing and Patient connected to face mask oxgen  Post-op Assessment: Report given to PACU RN and Post -op Vital signs reviewed and stable  Post vital signs: Reviewed and stable  Complications: No apparent anesthesia complications

## 2013-03-30 NOTE — Interval H&P Note (Signed)
History and Physical Interval Note:  03/30/2013 1:23 PM  Alison [name]  has presented today for surgery, with the diagnosis of lumbar synoval cyst L4 - L5 on the left  The various methods of treatment have been discussed with the patient and family. After consideration of risks, benefits and other options for treatment, the patient has consented to  Procedure(s): CENTRAL DECOMPRESSION L4 - L5 AND EXCISION 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 (N/A) as a surgical intervention .  The patient's history has been reviewed, patient examined, no change in status, stable for surgery.  I have reviewed the patient's chart and labs.  Questions were answered to the patient's satisfaction.     Amahri Dengel A

## 2013-03-30 NOTE — Anesthesia Preprocedure Evaluation (Addendum)
Anesthesia Evaluation  Patient identified by MRN, date of birth, ID band Patient awake    Reviewed: Allergy & Precautions, H&P , NPO status , Patient's Chart, lab work & pertinent test results, reviewed documented beta blocker date and time   Airway Mallampati: II  TM Distance: >3 FB Neck ROM: full    Dental  (+) Edentulous Upper, Dental Advisory Given   Pulmonary neg pulmonary ROS, Current Smoker,  breath sounds clear to auscultation  Pulmonary exam normal       Cardiovascular hypertension, Pt. on medications Rhythm:regular Rate:Normal     Neuro/Psych negative neurological ROS  negative psych ROS   GI/Hepatic negative GI ROS, Neg liver ROS,   Endo/Other  diabetes, Well Controlled, Type 2, Oral Hypoglycemic Agents  Renal/GU negative Renal ROS  negative genitourinary   Musculoskeletal   Abdominal   Peds  Hematology negative hematology ROS (+) Pernicious anemia   Anesthesia Other Findings   Reproductive/Obstetrics negative OB ROS                            Anesthesia Physical  Anesthesia Plan  ASA: III  Anesthesia Plan: General   Post-op Pain Management:    Induction: Intravenous  Airway Management Planned: Oral ETT  Additional Equipment:   Intra-op Plan:   Post-operative Plan: Extubation in OR  Informed Consent: I have reviewed the patients History and Physical, chart, labs and discussed the procedure including the risks, benefits and alternatives for the proposed anesthesia with the patient or authorized representative who has indicated his/her understanding and acceptance.   Dental Advisory Given  Plan Discussed with: CRNA and Surgeon  Anesthesia Plan Comments:         Anesthesia Quick Evaluation  

## 2013-03-31 DIAGNOSIS — M7138 Other bursal cyst, other site: Secondary | ICD-10-CM | POA: Diagnosis present

## 2013-03-31 LAB — GLUCOSE, CAPILLARY
GLUCOSE-CAPILLARY: 100 mg/dL — AB (ref 70–99)
Glucose-Capillary: 107 mg/dL — ABNORMAL HIGH (ref 70–99)

## 2013-03-31 LAB — MRSA CULTURE

## 2013-03-31 MED ORDER — OXYCODONE-ACETAMINOPHEN 5-325 MG PO TABS: 1.0000 | ORAL_TABLET | ORAL | Status: DC | PRN

## 2013-03-31 MED ORDER — METHOCARBAMOL 500 MG PO TABS: 500.0000 mg | ORAL_TABLET | Freq: Three times a day (TID) | ORAL | Status: DC | PRN

## 2013-03-31 NOTE — Progress Notes (Signed)
   CARE MANAGEMENT NOTE 03/31/2013  Patient:  [facility]   Account Number:  [id number]  Date Initiated:  03/31/2013  Documentation initiated by:  [facility]  Subjective/Objective Assessment:   CENTRAL DECOMPRESSION L4 - L5 AND EXCISION 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     Action/Plan:   Anticipated DC Date:  04/01/2013   Anticipated DC Plan:  [place]  CM consult      Choice offered to / List presented to:             Status of service:  Completed, signed off Medicare Important Message given?   (If response is "NO", the following Medicare IM given date fields will be blank) Date Medicare IM given:   Date Additional Medicare IM given:    Discharge Disposition:  HOME/SELF CARE  Per UR Regulation:    If discussed at Long Length of Stay Meetings, dates discussed:    Comments:  03/31/2013 1000 NCM spoke to pt and states her Aunt will be there at all times to assist with her care. She has RW at home. No PT follow recommended. Jonnie Finner RN CCM Case Mgmt phone [phone]

## 2013-03-31 NOTE — Discharge Instructions (Signed)
Change your dressing daily. Shower only, no tub bath. You may shower starting Saturday. Before showering, remove dressing and place saran wrap over the incision.  After showering remove saran wrap and place new dressing over the incision.  You only need to use the saran wrap through Monday.  Call if any temperatures greater than 101 or any wound complications: [phone] during the day and ask for Dr. Charlestine Night nurse, Brunilda Payor.

## 2013-03-31 NOTE — Evaluation (Signed)
Occupational Therapy Evaluation Patient Details Name: Alison [name] MRN: [id number] DOB: [birth year] Today's Date: 03/31/2013 Time: 1829-9371 OT Time Calculation (min): 44 min  OT Assessment / Plan / Recommendation History of present illness pt was admitted for L4-5 decompression and excision of synovial cyst   Clinical Impression   Pt was seen for initial evaluation.  Pt verbalizes understanding of all education.        OT Assessment   No further OT needs   Follow Up Recommendations   No follow up OT   Barriers to Discharge      Equipment Recommendations  None recommended by OT    Recommendations for Other Services    Frequency       Precautions / Restrictions Precautions Precautions: Back Restrictions Weight Bearing Restrictions: No   Pertinent Vitals/Pain Back sore/uncomfortable sitting in chair; repositioned in bed. Pain not rated    ADL  Grooming: Teeth care;Supervision/safety Where Assessed - Grooming: Supported standing Upper Body Bathing: Set up Where Assessed - Upper Body Bathing: Unsupported sitting Lower Body Bathing: Moderate assistance Where Assessed - Lower Body Bathing: Supported sit to stand Upper Body Dressing: Set up Where Assessed - Upper Body Dressing: Unsupported sitting Lower Body Dressing: Maximal assistance Where Assessed - Lower Body Dressing: Supported sit to stand Toilet Transfer: Minimal assistance Toilet Transfer Method: Sit to Loss adjuster, chartered: Raised toilet seat with arms (or 3-in-1 over toilet) Toileting - Clothing Manipulation and Hygiene: Minimal assistance Where Assessed - Best boy and Hygiene: Sit to stand from 3-in-1 or toilet Equipment Used: Rolling walker Transfers/Ambulation Related to ADLs: ambulated from bathroom to recliner then repositioned back in bed as she was uncomfortable ADL Comments: educated on toilet aide.  Pt will have assist for adls.  Reviewed back precautions and AE.  Pt  has a long sponge at home.  Handout given    OT Diagnosis: Generalized weakness  OT Problem List: Decreased strength;Decreased knowledge of use of DME or AE;Decreased knowledge of precautions;Pain OT Treatment Interventions:     OT Goals(Current goals can be found in the care plan section) Acute Rehab OT Goals Patient Stated Goal: get back and knee better  Visit Information  Last OT Received On: 03/31/13 Assistance Needed: +1 History of Present Illness: pt was admitted for L4-5 decompression and excision of synovial cyst       Prior [place] expects to be discharged to:: Private residence Available Help at Discharge: Family Type of Home: House Home Access: Stairs to enter [employer] of Steps: 5  also has ramp in back Entrance Stairs-Rails: Right;Left Home Layout: One level Home Equipment: Environmental consultant - 2 wheels;Tub bench;Bedside commode Prior Function Level of Independence: Independent;Independent with assistive device(s) Communication Communication: No difficulties Dominant Hand: Right         Vision/Perception     Cognition  Cognition Arousal/Alertness: Awake/alert Behavior During Therapy: WFL for tasks assessed/performed Overall Cognitive Status: Within Functional Limits for tasks assessed    Extremity/Trunk Assessment Upper Extremity Assessment Upper Extremity Assessment: Overall WFL for tasks assessed     Mobility Bed Mobility Overal bed mobility: Needs Assistance Bed Mobility: Sit to Sidelying Sit to sidelying: Mod assist General bed mobility comments: cues for technique Transfers Overall transfer level: Needs assistance Equipment used: Rolling walker (2 wheeled) Transfers: Sit to/from Stand Sit to Stand: Min guard General transfer comment: cues for back precautions     Exercise     Balance     End of Session  OT - End of Session Activity Tolerance: Patient tolerated treatment well Patient left: in  bed;with call bell/phone within reach  GO Functional Assessment Tool Used: clinical observation Functional Limitation: Self care Self Care Current Status (I3254): At least 60 percent but less than 80 percent impaired, limited or restricted Self Care Goal Status (D8264): At least 60 percent but less than 80 percent impaired, limited or restricted Self Care Discharge Status [phone]): At least 60 percent but less than 80 percent impaired, limited or restricted   [facility] 03/31/2013, 8:48 AM Lesle Chris, OTR/L [phone] 03/31/2013

## 2013-03-31 NOTE — Progress Notes (Signed)
UR completed 

## 2013-03-31 NOTE — Evaluation (Signed)
Physical Therapy Evaluation Patient Details Name: Alison [name] MRN: [id number] DOB: 02-06-1945 Today's Date: 03/31/2013 Time: 5638-7564 PT Time Calculation (min): 25 min  PT Assessment / Plan / Recommendation History of Present Illness  pt s/p L4-5 decompression and excision of synovial cyst, post-op day 1  Clinical Impression  Pt evaluated by Physical Therapy with no further acute care PT needs identified.  All education has been completed and the pt has no further questions.  Pt ambulated in hallway with rolling walker and practiced steps. Pt familiar with precautions but reinforced (recalled 1/3 precautions).  Has 'auntie' at home to assist 24 hr.  Pt to continue follow-up outpatient PT for knee per MD.  No equipment needs.  Pt is signing off. Thank you for the referral.    PT Assessment  Patent does not need any further PT services    Follow Up Recommendations  Supervision for mobility/OOB;No PT follow up    Does the patient have the potential to tolerate intense rehabilitation      Barriers to Discharge        Equipment Recommendations  None recommended by PT    Recommendations for Other Services     Frequency      Precautions / Restrictions Precautions Precautions: Back Precaution Comments: No bending, arching, twisting reinforced Restrictions Weight Bearing Restrictions: No   Pertinent Vitals/Pain Pt premedicated, activity to tolerance.      Mobility  Bed Mobility Overal bed mobility: Needs Assistance Bed Mobility: Supine to Sit;Rolling Rolling: Min guard Supine to sit: Min guard Sit to sidelying: Min guard General bed mobility comments: verbal cues for sequencing and technique to maintain precautions, reinforced log rolling technique for transfer; pt dressed at bed level before ambulation Transfers Overall transfer level: Needs assistance Equipment used: Rolling walker (2 wheeled) Transfers: Sit to/from Stand Sit to Stand: Min guard General transfer  comment: verbal cues for safefy and precautions; reinforced squatting to descend to bed level  Ambulation/Gait Ambulation/Gait assistance: Min guard Ambulation Distance (Feet): 160 Feet Assistive device: Rolling walker (2 wheeled) (pt reported lack of confidence in balance, wanted to use rolling walker to help steady herself during gait (not using rolling walker at baseline)) Gait Pattern/deviations: Step-through pattern;Decreased dorsiflexion - left ( dorsiflexion and great toe extension limited due to strength but foot drop not apparent ) Gait velocity: decr General Gait Details: verbal cues for safety; 1 incidence of LOB posteriorly, able to regain balance with step strategy and min guard Stairs: Yes Stairs assistance: Min guard Stair Management: One rail Right Number of Stairs: 4 General stair comments: verbal cues for safety and sequencing; ascend stairs with supervision; required min guard for descent due to decreased confidence in balance, 4 steps x2    Exercises     PT Diagnosis:    PT Problem List:   PT Treatment Interventions:       PT Goals(Current goals can be found in the care plan section) Acute Rehab PT Goals Patient Stated Goal: get back and knee better PT Goal Formulation: No goals set, d/c therapy  Visit Information  Last PT Received On: 03/31/13 Assistance Needed: +1 History of Present Illness: pt s/p L4-5 decompression and excision of synovial cyst, post-op day 1       Prior Functioning  Home Living Family/patient expects to be discharged to:: Private residence Living Arrangements: Other relatives (aunt staying 24/7) Available Help at Discharge: Family Type of Home: House Home Access: Stairs to enter [employer] of Steps: 5 stairs to enter, ramp to  enter Entrance Stairs-Rails: Right;Left Home Layout: One level Home Equipment: [place] - 2 wheels;Tub bench;Bedside commode Prior Function Level of Independence: Independent;Independent with  assistive device(s) Communication Communication: No difficulties Dominant Hand: Right    Cognition  Cognition Arousal/Alertness: Awake/alert Behavior During Therapy: WFL for tasks assessed/performed Overall Cognitive Status: Within Functional Limits for tasks assessed    Extremity/Trunk Assessment Upper Extremity Assessment Upper Extremity Assessment: Overall WFL for tasks assessed Lower Extremity Assessment Lower Extremity Assessment: Overall WFL for tasks assessed;LLE deficits/detail LLE Deficits / Details: decreased strength in dorsiflexion and first toe extension (3/5), tested and pt reported; pt reported numbness in great toe, pt educated on use of shoes when OOB while numbness continues LLE Sensation: decreased light touch   Balance    End of Session PT - End of Session Equipment Utilized During Treatment: Gait belt Activity Tolerance: Patient tolerated treatment well Patient left: in bed;with call bell/phone within reach  GP     Jacqulyn Cane 03/31/2013, 11:09 AM Jacqulyn Cane SPT 03/31/2013  Carmelia Bake, PT, DPT 03/31/2013 Pager: [phone]

## 2013-03-31 NOTE — Op Note (Signed)
NAMENAVAYA, WIATREK NO.:  [id number]  MEDICAL RECORD NO.:  [id number]  LOCATION:  9326                         FACILITY:  [facility]  PHYSICIAN:  Kipp Brood. Nahuel Wilbert, M.D.DATE OF BIRTH:  1945/02/16  DATE OF PROCEDURE:  03/30/2013 DATE OF DISCHARGE:                              OPERATIVE REPORT   SURGEON:  Kipp Brood. Gladstone Lighter, M.D.  ASSISTANT:  Tarri Glenn, M.D.  PREOPERATIVE DIAGNOSES: 1. 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 2. Large synovial cyst originating from the facet L4-5 on the left. 3. Just about 75% footdrop on the left.  She had left leg pain only.  PROCEDURE IN DETAIL:  Under general anesthesia, routine orthopedic prepping and draping of the lower back was carried out.  The patient was on a spinal frame.  She was given 2 g of IV Ancef.  At this time, the appropriate time-out was first carried out, and 2 needles were placed in the back for localization purposes, an x-ray was taken.  At this time, I injected a mixture of 20 mL of Marcaine with epinephrine into the muscle area to control bleeding.  After that, incision was made over the L4-L5 region.  At that particular time, bleeders were identified and cauterized.  We stripped the muscle from the lamina and spinous processes bilaterally.  Two Kocher clamps were placed in lamina on the spinous process.  We identified the L4-5 space and then removed the spinous processes of L4, part of L3 above, and a part of L5 below.  We then went down and did a complete lumbar laminectomy.  Great care taken to protect the dura.  The microscope was brought in.  Noted at this time, we noticed that there was extreme tightness along the entire lateral recess at L4-5 on the left.  We gently identified the lateral recess and then removed all the bone above the ligamentum flavum.  We then went down and separated the ligamentum flavum from the dura.  There was a large synovial cyst adhered.  We gently teased  it off as well as the ligamentum flavum.  We did a nice foraminotomy for the 5 root distally and one for the 4 root above.  We now decompressed the lateral recess as well.  We were able to easily pass a hockey-stick out both foramina, and we completed the case.  Note, there was extremely tight up against the 5 root.  The synovial cyst plus the ligamentum flavum were extremely thick.  We thoroughly irrigated out the area after we made sure we could pass a hockey-stick freely in all directions.  We then closed the deep muscle layer left portions of the distal and proximal deep parts open.  Subcu was closed with 0 Vicryl, we then injected 20 mL of Exparel under the soft tissue.  Remaining part of the wound was closed in layers in usual fashion.  Sterile dressings were applied.          ______________________________ Kipp Brood Gladstone Lighter, M.D.    RAG/MEDQ  D:  03/30/2013  T:  03/31/2013  Job:  [id number]

## 2013-03-31 NOTE — Progress Notes (Signed)
   Subjective: 1 Day Post-Op Procedure(s) (LRB): CENTRAL DECOMPRESSION L4 - L5 AND EXCISION 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 (N/A) Patient reports pain as mild.   Patient seen in rounds without Dr. Gladstone Lighter. Patient is well, and has had no acute complaints or problems. She reports that she is feeling much better. She still has some soreness in her back and left leg but it "is improved". No issues overnight. No SOB or chest pain.  Plan is to go Home after hospital stay.  Objective: Vital signs in last 24 hours: Temp:  [97.5 F (36.4 C)-98.8 F (37.1 C)] 98.2 F (36.8 C) (03/20 0540) Pulse Rate:  [76-87] 78 (03/20 0540) Resp:  [14-27] 16 (03/20 0540) BP: (118-153)/(64-77) 129/71 mmHg (03/20 0540) SpO2:  [94 %-100 %] 99 % (03/20 0540) Weight:  [88 kg (194 lb 0.1 oz)] 88 kg (194 lb 0.1 oz) (03/19 1715)  Intake/Output from previous day:  Intake/Output Summary (Last 24 hours) at 03/31/13 0722 Last data filed at 03/31/13 4696  Gross per 24 hour  Intake 3318.7 ml  Output   2200 ml  Net 1118.7 ml    Intake/Output this shift:    Labs:  Recent Labs  03/28/13 1425  HGB 12.2    Recent Labs  03/28/13 1425  WBC 14.0*  RBC 4.51  HCT 37.3  PLT 382    Recent Labs  03/28/13 1425  NA 137  K 4.1  CL 99  CO2 26  BUN 12  CREATININE 0.74  GLUCOSE 114*  CALCIUM 10.0   No results found for this basename: LABPT, INR,  in the last 72 hours  EXAM General - Patient is Alert and Oriented Extremity - Neurologically intact Intact pulses distally Dorsiflexion/Plantar flexion intact Motor Function - intact, moving foot and toes well on exam.   Past Medical History  Diagnosis Date  . Unspecified vitamin D deficiency   . Obesity, unspecified   . Goiter, specified as simple   . Pernicious anemia   . Hyperlipidemia LDL goal < 100   . Unspecified glaucoma   . Benign essential hypertension   . Diverticulitis of colon (without mention of hemorrhage)   . Osteoarthrosis, unspecified  whether generalized or localized, lower leg   . Osteopenia   . Numbness     TOES RT FOOT and toes left foot  . Diabetes mellitus without complication   . Other specified disease of nail     FUNGUS OF FINGERNAILS    Assessment/Plan: 1 Day Post-Op Procedure(s) (LRB): CENTRAL DECOMPRESSION L4 - L5 AND EXCISION 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 (N/A) Active Problems:   Spinal stenosis, lumbar region, with neurogenic claudication   Synovial cyst of lumbar facet joint  Estimated body mass index is 31.33 kg/(m^2) as calculated from the following:   Height as of this encounter: 5\' 6"  (1.676 m).   Weight as of this encounter: 88 kg (194 lb 0.1 oz). Advance diet Up with therapy D/C IV fluids Discharge home   DVT Prophylaxis - Aspirin Weight-Bearing as tolerated  She is progressing well. Will DC home today after PT. Discharge instructions given. Follow up in office in 2 weeks.   Tyeler Goedken, [employer] 03/31/2013, 7:22 AM

## 2013-04-03 ENCOUNTER — Encounter (HOSPITAL_COMMUNITY): Payer: Self-pay | Admitting: Orthopedic Surgery

## 2013-04-03 ENCOUNTER — Ambulatory Visit: Payer: Medicare Other | Admitting: Internal Medicine

## 2013-04-03 NOTE — Discharge Summary (Signed)
Physician Discharge Summary   Patient ID: Alison [name] MRN: [id number] DOB/AGE: 08-22-1945 68 y.o.  Admit date: 03/30/2013 Discharge date: 03/31/2013  Primary Diagnosis: Synovial cyst lumbar spine  Admission Diagnoses:  Past Medical History  Diagnosis Date  . Unspecified vitamin D deficiency   . Obesity, unspecified   . Goiter, specified as simple   . Pernicious anemia   . Hyperlipidemia LDL goal < 100   . Unspecified glaucoma   . Benign essential hypertension   . Diverticulitis of colon (without mention of hemorrhage)   . Osteoarthrosis, unspecified whether generalized or localized, lower leg   . Osteopenia   . Numbness     TOES RT FOOT and toes left foot  . Diabetes mellitus without complication   . Other specified disease of nail     FUNGUS OF FINGERNAILS   Discharge Diagnoses:   Active Problems:   Spinal stenosis, lumbar region, with neurogenic claudication   Synovial cyst of lumbar facet joint   Synovial cyst of lumbar spine  Estimated body mass index is 31.33 kg/(m^2) as calculated from the following:   Height as of this encounter: $RemoveBeforeD'5\' 6"'[id number]$  (1.676 m).   Weight as of this encounter: 88 kg (194 lb 0.1 oz).  Procedure:  Procedure(s) (LRB): CENTRAL DECOMPRESSION L4 - L5 AND EXCISION 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 (N/A)   Consults: None  HPI: Alison [name] is a 68 year old female who presented with the chief complaint of low back pain. She denies injury but starting having significant low back pain radiating into the left LE about a month ago. She recently underwent a right TKA from which she is recovering well. She denies numbness and tingling in the legs. No change in bladder or bowel function. She is having pain at all times, including sitting, standing, and lying down. MRI of the lumbar spine revealed a synovial cyst at L4-L5 on the left.   Laboratory Data: Admission on 03/30/2013, Discharged on 03/31/2013  Component Date Value Ref Range Status  . Glucose-Capillary  03/30/2013 123* 70 - 99 mg/dL Final  . Comment 1 03/30/2013 Notify RN   Final  . Glucose-Capillary 03/30/2013 137* 70 - 99 mg/dL Final  . Comment 1 03/30/2013 Documented in Chart   Final  . Comment 2 03/30/2013 Notify RN   Final  . Glucose-Capillary 03/30/2013 132* 70 - 99 mg/dL Final  . Comment 1 03/30/2013 Notify RN   Final  . Comment 2 03/30/2013 Documented in Chart   Final  . Glucose-Capillary 03/30/2013 124* 70 - 99 mg/dL Final  . Glucose-Capillary 03/31/2013 107* 70 - 99 mg/dL Final  . Glucose-Capillary 03/31/2013 100* 70 - 99 mg/dL Final  Hospital Outpatient Visit on 03/28/2013  Component Date Value Ref Range Status  . WBC 03/28/2013 14.0* 4.0 - 10.5 K/uL Final  . RBC 03/28/2013 4.51  3.87 - 5.11 MIL/uL Final  . Hemoglobin 03/28/2013 12.2  12.0 - 15.0 g/dL Final  . HCT 03/28/2013 37.3  36.0 - 46.0 % Final  . MCV 03/28/2013 82.7  78.0 - 100.0 fL Final  . MCH 03/28/2013 27.1  26.0 - 34.0 pg Final  . MCHC 03/28/2013 32.7  30.0 - 36.0 g/dL Final  . RDW 03/28/2013 14.8  11.5 - 15.5 % Final  . Platelets 03/28/2013 382  150 - 400 K/uL Final  . Sodium 03/28/2013 137  137 - 147 mEq/L Final  . Potassium 03/28/2013 4.1  3.7 - 5.3 mEq/L Final  . Chloride 03/28/2013 99  96 - 112 mEq/L Final  .  CO2 03/28/2013 26  19 - 32 mEq/L Final  . Glucose, Bld 03/28/2013 114* 70 - 99 mg/dL Final  . BUN 03/28/2013 12  6 - 23 mg/dL Final  . Creatinine, Ser 03/28/2013 0.74  0.50 - 1.10 mg/dL Final  . Calcium 03/28/2013 10.0  8.4 - 10.5 mg/dL Final  . GFR calc non Af Amer 03/28/2013 85* >90 mL/min Final  . GFR calc Af Amer 03/28/2013 >90  >90 mL/min Final   Comment: (NOTE)                          The eGFR has been calculated using the CKD EPI equation.                          This calculation has not been validated in all clinical situations.                          eGFR's persistently <90 mL/min signify possible Chronic Kidney                          Disease.  Marland Kitchen MRSA, PCR 03/28/2013 INVALID  RESULTS, SPECIMEN SENT FOR CULTURE* NEGATIVE Corrected   Comment: RESULT CALLED TO, READ BACK BY AND VERIFIED WITH:                          AFTER HOURS 2045 [id number] COVINGTON,N                          CORRECTED ON 03/18 AT 1203: PREVIOUSLY REPORTED AS RESULT CALLED TO, READ BACK BY AND VERIFIED WITH: AFTER HOURS 2045 [id number] COVINGTON,N  . Staphylococcus aureus 03/28/2013 INVALID RESULTS, SPECIMEN SENT FOR CULTURE* NEGATIVE Corrected   Comment: RESULT CALLED TO, READ BACK BY AND VERIFIED WITH:                          AFTER HOURS 2045 [id number] COVINGTON,N                          CORRECTED ON 03/18 AT 1203: PREVIOUSLY REPORTED AS RESULT CALLED TO, READ BACK BY AND VERIFIED WITH: AFTER HOURS 2045 [id number] COVINGTON,N        The Xpert SA Assay (FDA approved for NASAL specimens in patients over 67 years of age), is one component of a                           comprehensive surveillance program.  Test performance has been validated by [employer] for patients greater than or equal to 3 year old. It is not intended to diagnose infection nor to guide or monitor treatment.  Marland Kitchen Specimen Description 03/28/2013 NASOPHARYNGEAL   Final  . Special Requests 03/28/2013 NONE   Final  . Culture 03/28/2013    Final                   Value:NOMRSA                         Performed at [facility]  . Report Status 03/28/2013 03/31/2013 FINAL   Final  Admission on 02/01/2013, Discharged on 02/04/2013  Component Date Value  Ref Range Status  . ABO/RH(D) 02/01/2013 B POS   Final  . Antibody Screen 02/01/2013 NEG   Final  . Sample Expiration 02/01/2013 02/04/2013   Final  . WBC 02/01/2013 13.4* 4.0 - 10.5 K/uL Final  . RBC 02/01/2013 4.70  3.87 - 5.11 MIL/uL Final  . Hemoglobin 02/01/2013 13.8  12.0 - 15.0 g/dL Final  . HCT 02/01/2013 40.7  36.0 - 46.0 % Final  . MCV 02/01/2013 86.6  78.0 - 100.0 fL Final  . MCH 02/01/2013 29.4  26.0 - 34.0 pg Final  . MCHC 02/01/2013 33.9  30.0 - 36.0 g/dL Final  . RDW  02/01/2013 14.0  11.5 - 15.5 % Final  . Platelets 02/01/2013 274  150 - 400 K/uL Final  . Neutrophils Relative % 02/01/2013 67  43 - 77 % Final  . Neutro Abs 02/01/2013 9.0* 1.7 - 7.7 K/uL Final  . Lymphocytes Relative 02/01/2013 23  12 - 46 % Final  . Lymphs Abs 02/01/2013 3.1  0.7 - 4.0 K/uL Final  . Monocytes Relative 02/01/2013 8  3 - 12 % Final  . Monocytes Absolute 02/01/2013 1.0  0.1 - 1.0 K/uL Final  . Eosinophils Relative 02/01/2013 2  0 - 5 % Final  . Eosinophils Absolute 02/01/2013 0.2  0.0 - 0.7 K/uL Final  . Basophils Relative 02/01/2013 0  0 - 1 % Final  . Basophils Absolute 02/01/2013 0.0  0.0 - 0.1 K/uL Final  . Glucose-Capillary 02/01/2013 102* 70 - 99 mg/dL Final  . Comment 1 02/01/2013 Documented in Chart   Final  . ABO/RH(D) 02/01/2013 B POS   Final  . Glucose-Capillary 02/01/2013 137* 70 - 99 mg/dL Final  . Comment 1 02/01/2013 Documented in Chart   Final  . Comment 2 02/01/2013 Notify RN   Final  . WBC 02/02/2013 15.6* 4.0 - 10.5 K/uL Final  . RBC 02/02/2013 3.63* 3.87 - 5.11 MIL/uL Final  . Hemoglobin 02/02/2013 10.3* 12.0 - 15.0 g/dL Final   Comment: DELTA CHECK NOTED                          REPEATED TO VERIFY  . HCT 02/02/2013 31.4* 36.0 - 46.0 % Final  . MCV 02/02/2013 86.5  78.0 - 100.0 fL Final  . MCH 02/02/2013 28.4  26.0 - 34.0 pg Final  . MCHC 02/02/2013 32.8  30.0 - 36.0 g/dL Final  . RDW 02/02/2013 14.2  11.5 - 15.5 % Final  . Platelets 02/02/2013 238  150 - 400 K/uL Final  . Sodium 02/02/2013 138  137 - 147 mEq/L Final  . Potassium 02/02/2013 3.8  3.7 - 5.3 mEq/L Final  . Chloride 02/02/2013 101  96 - 112 mEq/L Final  . CO2 02/02/2013 25  19 - 32 mEq/L Final  . Glucose, Bld 02/02/2013 132* 70 - 99 mg/dL Final  . BUN 02/02/2013 10  6 - 23 mg/dL Final  . Creatinine, Ser 02/02/2013 0.65  0.50 - 1.10 mg/dL Final  . Calcium 02/02/2013 8.6  8.4 - 10.5 mg/dL Final  . GFR calc non Af Amer 02/02/2013 89* >90 mL/min Final  . GFR calc Af Amer 02/02/2013  >90  >90 mL/min Final   Comment: (NOTE)                          The eGFR has been calculated using the CKD EPI equation.  This calculation has not been validated in all clinical situations.                          eGFR's persistently <90 mL/min signify possible Chronic Kidney                          Disease.  . Glucose-Capillary 02/01/2013 151* 70 - 99 mg/dL Final  . Comment 1 02/01/2013 Notify RN   Final  . Comment 2 02/01/2013 Documented in Chart   Final  . Glucose-Capillary 02/01/2013 176* 70 - 99 mg/dL Final  . Comment 1 02/01/2013 Documented in Chart   Final  . Comment 2 02/01/2013 Notify RN   Final  . Glucose-Capillary 02/02/2013 119* 70 - 99 mg/dL Final  . Glucose-Capillary 02/02/2013 124* 70 - 99 mg/dL Final  . Comment 1 02/02/2013 Notify RN   Final  . Comment 2 02/02/2013 Documented in Chart   Final  . Glucose-Capillary 02/02/2013 126* 70 - 99 mg/dL Final  . Comment 1 02/02/2013 Notify RN   Final  . Comment 2 02/02/2013 Documented in Chart   Final  . WBC 02/03/2013 15.1* 4.0 - 10.5 K/uL Final  . RBC 02/03/2013 3.39* 3.87 - 5.11 MIL/uL Final  . Hemoglobin 02/03/2013 9.7* 12.0 - 15.0 g/dL Final  . HCT 02/03/2013 29.8* 36.0 - 46.0 % Final  . MCV 02/03/2013 87.9  78.0 - 100.0 fL Final  . MCH 02/03/2013 28.6  26.0 - 34.0 pg Final  . MCHC 02/03/2013 32.6  30.0 - 36.0 g/dL Final  . RDW 02/03/2013 14.4  11.5 - 15.5 % Final  . Platelets 02/03/2013 213  150 - 400 K/uL Final  . Sodium 02/03/2013 138  137 - 147 mEq/L Final  . Potassium 02/03/2013 3.5* 3.7 - 5.3 mEq/L Final  . Chloride 02/03/2013 101  96 - 112 mEq/L Final  . CO2 02/03/2013 24  19 - 32 mEq/L Final  . Glucose, Bld 02/03/2013 138* 70 - 99 mg/dL Final  . BUN 02/03/2013 13  6 - 23 mg/dL Final  . Creatinine, Ser 02/03/2013 0.79  0.50 - 1.10 mg/dL Final  . Calcium 02/03/2013 8.4  8.4 - 10.5 mg/dL Final  . GFR calc non Af Amer 02/03/2013 84* >90 mL/min Final  . GFR calc Af Amer 02/03/2013 >90  >90  mL/min Final   Comment: (NOTE)                          The eGFR has been calculated using the CKD EPI equation.                          This calculation has not been validated in all clinical situations.                          eGFR's persistently <90 mL/min signify possible Chronic Kidney                          Disease.  . Glucose-Capillary 02/02/2013 130* 70 - 99 mg/dL Final  . Glucose-Capillary 02/03/2013 142* 70 - 99 mg/dL Final  . Glucose-Capillary 02/03/2013 122* 70 - 99 mg/dL Final  . WBC 02/04/2013 12.7* 4.0 - 10.5 K/uL Final  . RBC 02/04/2013 3.29* 3.87 - 5.11 MIL/uL Final  . Hemoglobin 02/04/2013 9.5* 12.0 -  15.0 g/dL Final  . HCT 91/63/8466 28.9* 36.0 - 46.0 % Final  . MCV 02/04/2013 87.8  78.0 - 100.0 fL Final  . MCH 02/04/2013 28.9  26.0 - 34.0 pg Final  . MCHC 02/04/2013 32.9  30.0 - 36.0 g/dL Final  . RDW 59/93/5701 14.3  11.5 - 15.5 % Final  . Platelets 02/04/2013 230  150 - 400 K/uL Final  . Glucose-Capillary 02/03/2013 116* 70 - 99 mg/dL Final  . Glucose-Capillary 02/03/2013 130* 70 - 99 mg/dL Final  . Glucose-Capillary 02/04/2013 125* 70 - 99 mg/dL Final  . Comment 1 77/93/9030 Notify RN   Final  . Glucose-Capillary 02/04/2013 112* 70 - 99 mg/dL Final     X-Rays:Dg Lumbar Spine 2-3 Views  03/30/2013   CLINICAL DATA:  Preoperative evaluation for lumbar decompression, low back pain  EXAM: LUMBAR SPINE - 2-3 VIEW  COMPARISON:  02/02/2013  FINDINGS: Five lumbar type vertebral bodies are well visualized. Mild anterolisthesis of L4 on L5 is noted which appears to be degenerative in nature. Mild facet hypertrophic changes are seen. A very amount scoliosis concave to the right is noted in the lumbar spine which may be positional in nature. No compression deformities are seen. Mild aortic calcifications are noted.  IMPRESSION: Anterolisthesis of L4 on L5   Electronically Signed   By: Alcide Clever M.D.   On: 03/30/2013 12:29   Dg Spine Portable 1 View  03/30/2013   ADDENDUM  REPORT: 03/30/2013 15:52  ADDENDUM: COMPARISON: DG SPINE PORTABLE 1 VIEW dated 03/30/2013; DG SPINE PORTABLE 1 VIEW dated 03/30/2013; DG LUMBAR SPINE 2-3 VIEWS dated 03/30/2013   Electronically Signed   By: Elige Ko   On: 03/30/2013 15:52   03/30/2013   CLINICAL DATA:  Surgical level L4-5.  EXAM: PORTABLE SPINE - 1 VIEW  COMPARISON:  None.  FINDINGS: Single portable intraoperative lateral radiograph of the lumbar spine. There are tissue retractors posteriorly with a metallic probe tip projecting over the posterior element of L4.  There is degenerative disc disease at L4-5. There is grade 1 anterolisthesis of L4 on L5.  IMPRESSION: Single portable intraoperative lateral radiograph of the lumbar spine. There are tissue retractors posteriorly with a metallic probe tip projecting over the posterior element of L4.  Electronically Signed: By: Elige Ko On: 03/30/2013 15:24   Dg Spine Portable 1 View  03/30/2013   CLINICAL DATA:  Surgical level L4-5.  EXAM: PORTABLE SPINE - 1 VIEW  COMPARISON:  Radiography from the same day at 2:16 p.m.  FINDINGS: Lateral radiography of the lumbar spine shows surgical instruments bracketing the L4 vertebra posteriorly. The lower instrument is at the level of the L4-5 disc. Grade 1 L4-5 anterolisthesis.  IMPRESSION: Lumbar spine localization as above.   Electronically Signed   By: Tiburcio Pea M.D.   On: 03/30/2013 15:03   Dg Spine Portable 1 View  03/30/2013   CLINICAL DATA:  Spine localization levels centered at L4-5  EXAM: PORTABLE SPINE - 1 VIEW  COMPARISON:  DG LUMBAR SPINE 2-3 VIEWS dated 03/30/2013  FINDINGS: The metallic needle localization devices project posterior to the body of L4 and posterior to the body of L5 bracketing the L4-5 disc level. There is minimal grade 1 anterolisthesis of L4 with respect to L5.  IMPRESSION: The mid metallic needle localization devices project posterior to the bodies of L4 and L5.   Electronically Signed   By: David  [place]   On:  03/30/2013 14:24    EKG: Orders placed  during the hospital encounter of 02/01/13  . EKG     Hospital Course: Silena Wyss is a 68 y.o. who was admitted to [facility]. They were brought to the operating room on 03/30/2013 and underwent Procedure(s): CENTRAL DECOMPRESSION L4 - L5 AND EXCISION 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.  Patient tolerated the procedure well and was later transferred to the recovery room and then to the orthopaedic floor for postoperative care.  They were given PO and IV analgesics for pain control following their surgery.  They were given 24 hours of postoperative antibiotics of  Anti-infectives   Start     Dose/Rate Route Frequency Ordered Stop   03/30/13 2200  ceFAZolin (ANCEF) IVPB 1 g/50 mL premix  Status:  Discontinued     1 g 100 mL/hr over 30 Minutes Intravenous 3 times per day 03/30/13 1728 03/31/13 1708   03/30/13 1443  polymyxin B 500,000 Units, bacitracin 50,000 Units in sodium chloride irrigation 0.9 % 500 mL irrigation  Status:  Discontinued       As needed 03/30/13 1447 03/30/13 1608   03/30/13 1145  ceFAZolin (ANCEF) IVPB 2 g/50 mL premix     2 g 100 mL/hr over 30 Minutes Intravenous On call to O.R. 03/30/13 1133 03/30/13 1353     and started on DVT prophylaxis in the form of Aspirin.   PT was ordered.  Discharge planning consulted to help with postop disposition and equipment needs.  Patient had a fair night on the evening of surgery.  They started to get up OOB with therapy on day one.  Patient was seen in rounds and was ready to go home.   Discharge Medications: Prior to Admission medications   Medication Sig Start Date End Date Taking? Authorizing Provider  amLODipine (NORVASC) 10 MG tablet Take 10 mg by mouth daily with breakfast.  06/02/12  Yes Tiffany L Reed, DO  carboxymethylcellulose (REFRESH PLUS) 0.5 % SOLN Place 1 drop into both eyes 3 (three) times daily as needed (dry eyes).    Yes Historical Provider, MD  celecoxib (CELEBREX) 200  MG capsule Take 200 mg by mouth daily.   Yes Historical Provider, MD  ferrous sulfate 325 (65 FE) MG tablet Take 325 mg by mouth 2 (two) times daily with a meal.   Yes Historical Provider, MD  latanoprost (XALATAN) 0.005 % ophthalmic solution INSTILL 1 DROP IN BOTH EYES EVERY DAY AT BEDTIME   Yes Tiffany L Reed, DO  metFORMIN (GLUCOPHAGE) 500 MG tablet Take 250 mg by mouth daily with supper. 12/29/12  Yes Tiffany L Reed, DO  Multiple Vitamin (MULTIVITAMIN WITH MINERALS) TABS tablet Take 1 tablet by mouth daily.   Yes Historical Provider, MD  omega-3 acid ethyl esters (LOVAZA) 1 G capsule Take 2 g by mouth daily.   Yes Historical Provider, MD  pravastatin (PRAVACHOL) 80 MG tablet Take 80 mg by mouth every morning.  10/03/12  Yes Tiffany L Reed, DO  methocarbamol (ROBAXIN) 500 MG tablet Take 1 tablet (500 mg total) by mouth every 8 (eight) hours as needed for muscle spasms. 03/31/13   Tylah Mancillas Renelda Loma, PA-C  oxyCODONE-acetaminophen (PERCOCET/ROXICET) 5-325 MG per tablet Take 1-2 tablets by mouth every 4 (four) hours as needed for moderate pain. 03/31/13   Nour Scalise Renelda Loma, PA-C    Diet: Diabetic diet Activity:WBAT Follow-up:in 2 weeks Disposition - Home Discharged Condition: stable   Discharge Orders   Future Orders Complete By Expires   Call MD / Call 911  As  directed    Comments:     If you experience chest pain or shortness of breath, CALL 911 and be transported to the hospital emergency room.  If you develope a fever above 101 F, pus (white drainage) or increased drainage or redness at the wound, or calf pain, call your surgeon's office.   Constipation Prevention  As directed    Comments:     Drink plenty of fluids.  Prune juice may be helpful.  You may use a stool softener, such as Colace (over the counter) 100 mg twice a day.  Use MiraLax (over the counter) for constipation as needed.   Diet - low sodium heart healthy  As directed    Discharge instructions  As directed     Comments:     Change your dressing daily. Shower only, no tub bath. You may shower starting Saturday. Before showering, remove dressing and place saran wrap over the incision.  After showering remove saran wrap and place new dressing over the incision.  You only need to use the saran wrap through Monday.  Call if any temperatures greater than 101 or any wound complications: [phone] during the day and ask for Dr. Charlestine Night nurse, Brunilda Payor.   Driving restrictions  As directed    Comments:     No driving while on pain medications   Increase activity slowly as tolerated  As directed    Lifting restrictions  As directed    Comments:     No lifting       Medication List    STOP taking these medications       oxyCODONE-acetaminophen 10-325 MG per tablet  Commonly known as:  PERCOCET  Replaced by:  oxyCODONE-acetaminophen 5-325 MG per tablet      TAKE these medications       amLODipine 10 MG tablet  Commonly known as:  NORVASC  Take 10 mg by mouth daily with breakfast.     carboxymethylcellulose 0.5 % Soln  Commonly known as:  REFRESH PLUS  Place 1 drop into both eyes 3 (three) times daily as needed (dry eyes).     celecoxib 200 MG capsule  Commonly known as:  CELEBREX  Take 200 mg by mouth daily.     ferrous sulfate 325 (65 FE) MG tablet  Take 325 mg by mouth 2 (two) times daily with a meal.     latanoprost 0.005 % ophthalmic solution  Commonly known as:  XALATAN  INSTILL 1 DROP IN BOTH EYES EVERY DAY AT BEDTIME     metFORMIN 500 MG tablet  Commonly known as:  GLUCOPHAGE  Take 250 mg by mouth daily with supper.     methocarbamol 500 MG tablet  Commonly known as:  ROBAXIN  Take 1 tablet (500 mg total) by mouth every 8 (eight) hours as needed for muscle spasms.     multivitamin with minerals Tabs tablet  Take 1 tablet by mouth daily.     omega-3 acid ethyl esters 1 G capsule  Commonly known as:  LOVAZA  Take 2 g by mouth daily.     oxyCODONE-acetaminophen  5-325 MG per tablet  Commonly known as:  PERCOCET/ROXICET  Take 1-2 tablets by mouth every 4 (four) hours as needed for moderate pain.     pravastatin 80 MG tablet  Commonly known as:  PRAVACHOL  Take 80 mg by mouth every morning.           Follow-up Information   Follow up with GIOFFRE,RONALD A,  MD In 2 weeks.   Specialty:  Orthopedic Surgery   Contact information:   [address] [place] [address] [phone]       Signed: Ardeen Jourdain [facility] 04/03/2013, 11:41 AM

## 2013-04-26 DIAGNOSIS — H5200 Hypermetropia, unspecified eye: Secondary | ICD-10-CM | POA: Diagnosis not present

## 2013-04-26 DIAGNOSIS — H40059 Ocular hypertension, unspecified eye: Secondary | ICD-10-CM | POA: Diagnosis not present

## 2013-04-26 DIAGNOSIS — H40009 Preglaucoma, unspecified, unspecified eye: Secondary | ICD-10-CM | POA: Diagnosis not present

## 2013-04-26 DIAGNOSIS — H01009 Unspecified blepharitis unspecified eye, unspecified eyelid: Secondary | ICD-10-CM | POA: Diagnosis not present

## 2013-04-30 ENCOUNTER — Other Ambulatory Visit: Payer: Self-pay | Admitting: Internal Medicine

## 2013-05-02 DIAGNOSIS — Z4789 Encounter for other orthopedic aftercare: Secondary | ICD-10-CM | POA: Diagnosis not present

## 2013-05-04 ENCOUNTER — Encounter: Payer: Self-pay | Admitting: Internal Medicine

## 2013-05-04 ENCOUNTER — Encounter: Payer: Self-pay | Admitting: *Deleted

## 2013-05-04 ENCOUNTER — Ambulatory Visit (INDEPENDENT_AMBULATORY_CARE_PROVIDER_SITE_OTHER): Payer: Medicare Other | Admitting: Internal Medicine

## 2013-05-04 VITALS — BP 132/80 | HR 72 | Temp 97.4°F | Resp 18 | Ht 66.0 in | Wt 192.2 lb

## 2013-05-04 DIAGNOSIS — M7130 Other bursal cyst, unspecified site: Secondary | ICD-10-CM | POA: Diagnosis not present

## 2013-05-04 DIAGNOSIS — IMO0001 Reserved for inherently not codable concepts without codable children: Secondary | ICD-10-CM | POA: Diagnosis not present

## 2013-05-04 DIAGNOSIS — I10 Essential (primary) hypertension: Secondary | ICD-10-CM | POA: Diagnosis not present

## 2013-05-04 DIAGNOSIS — M1710 Unilateral primary osteoarthritis, unspecified knee: Secondary | ICD-10-CM

## 2013-05-04 DIAGNOSIS — E669 Obesity, unspecified: Secondary | ICD-10-CM

## 2013-05-04 DIAGNOSIS — D62 Acute posthemorrhagic anemia: Secondary | ICD-10-CM

## 2013-05-04 DIAGNOSIS — IMO0002 Reserved for concepts with insufficient information to code with codable children: Secondary | ICD-10-CM

## 2013-05-04 DIAGNOSIS — E1165 Type 2 diabetes mellitus with hyperglycemia: Secondary | ICD-10-CM

## 2013-05-04 DIAGNOSIS — M1711 Unilateral primary osteoarthritis, right knee: Secondary | ICD-10-CM

## 2013-05-04 DIAGNOSIS — Z9889 Other specified postprocedural states: Secondary | ICD-10-CM | POA: Insufficient documentation

## 2013-05-04 DIAGNOSIS — Z1231 Encounter for screening mammogram for malignant neoplasm of breast: Secondary | ICD-10-CM

## 2013-05-04 DIAGNOSIS — Z23 Encounter for immunization: Secondary | ICD-10-CM

## 2013-05-04 DIAGNOSIS — F17200 Nicotine dependence, unspecified, uncomplicated: Secondary | ICD-10-CM

## 2013-05-04 DIAGNOSIS — Z720 Tobacco use: Secondary | ICD-10-CM

## 2013-05-04 DIAGNOSIS — M7138 Other bursal cyst, other site: Secondary | ICD-10-CM

## 2013-05-04 MED ORDER — ZOSTER VACCINE LIVE 19400 UNT/0.65ML ~~LOC~~ SOLR: 0.6500 mL | Freq: Once | SUBCUTANEOUS | Status: DC

## 2013-05-04 NOTE — Progress Notes (Signed)
Patient ID: Alison [name], female   DOB: [birth year], 68 y.o.   MRN: [id number]   Location:  [facility]  Code Status: full code  No Known Allergies  Chief Complaint  Patient presents with  . Hospitalization Follow-up    back surgery    HPI: Patient is a 68 y.o. black female seen in the office today for hospital f/u after back surgery.  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  Had been having left-sided sciatica pain.  She is at home.  Not currlently getting any therapy.  Still having nerve pains down the left leg so therapy is on hold.  Right knee is giving her at fit.  Gabapentin is helping her pain a lot.    Is also now on baby asa.    Still smoking.    Bowels are moving ok with pain medications on board.  Saw Dr. Shirley Muscat last week--all was good.  Eye pressure was down.   Review of Systems:  Review of Systems  Constitutional: Positive for weight loss. Negative for fever, chills and malaise/fatigue.  HENT: Negative for congestion.   Eyes: Negative for blurred vision.  Respiratory: Negative for shortness of breath.   Cardiovascular: Negative for chest pain and leg swelling.  Gastrointestinal: Negative for heartburn, abdominal pain, blood in stool and melena.  Genitourinary: Negative for dysuria.  Musculoskeletal: Negative for back pain and myalgias.       Sciatica down left leg  Skin: Negative for rash.  Neurological: Positive for weakness. Negative for dizziness, loss of consciousness and headaches.  Endo/Heme/Allergies: Bruises/bleeds easily.  Psychiatric/Behavioral: Negative for depression and memory loss.    Past Medical History  Diagnosis Date  . Unspecified vitamin D deficiency   . Obesity, unspecified   . Goiter, specified as simple   . Pernicious anemia   . Hyperlipidemia LDL goal < 100   . Unspecified glaucoma   . Benign essential  hypertension   . Diverticulitis of colon (without mention of hemorrhage)   . Osteoarthrosis, unspecified whether generalized or localized, lower leg   . Osteopenia   . Numbness     TOES RT FOOT and toes left foot  . Diabetes mellitus without complication   . Other specified disease of nail     FUNGUS OF FINGERNAILS    Past Surgical History  Procedure Laterality Date  . Total knee arthroplasty Right 02/01/2013    Procedure: RIGHT TOTAL KNEE ARTHROPLASTY;  Surgeon: Tobi Bastos, MD;  Location: [facility];  Service: Orthopedics;  Laterality: Right;  . Colectomy  2009  . 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  She has never used smokeless tobacco. She reports that she does not drink alcohol or use illicit drugs.  Family History  Problem Relation Age of Onset  . Diabetes Sister     Medications: Patient's Medications  New Prescriptions   No medications on file  Previous Medications   AMLODIPINE (NORVASC) 10 MG TABLET    Take 10 mg by mouth daily with breakfast.    ASPIRIN 81 MG TABLET    Take 81 mg by mouth daily.   CARBOXYMETHYLCELLULOSE (REFRESH PLUS) 0.5 % SOLN  Place 1 drop into both eyes 3 (three) times daily as needed (dry eyes).    CELECOXIB (CELEBREX) 200 MG CAPSULE    Take 200 mg by mouth daily.   FERROUS SULFATE 325 (65 FE) MG TABLET    Take 325 mg by mouth 2 (two) times daily with a meal.   GABAPENTIN (NEURONTIN) 300 MG CAPSULE    Take 300 mg by mouth 3 (three) times daily.   HYDROCODONE-ACETAMINOPHEN (NORCO/VICODIN) 5-325 MG PER TABLET    Take 1 tablet by mouth 2 (two) times daily.   LATANOPROST (XALATAN) 0.005 % OPHTHALMIC SOLUTION    INSTILL 1 DROP IN BOTH EYES EVERY DAY AT BEDTIME   METFORMIN (GLUCOPHAGE) 500 MG  TABLET    Take 250 mg by mouth daily with supper.   METHOCARBAMOL (ROBAXIN) 500 MG TABLET    Take 1 tablet (500 mg total) by mouth every 8 (eight) hours as needed for muscle spasms.   MULTIPLE VITAMIN (MULTIVITAMIN WITH MINERALS) TABS TABLET    Take 1 tablet by mouth daily.   OMEGA-3 ACID ETHYL ESTERS (LOVAZA) 1 G CAPSULE    Take 2 g by mouth daily.   PRAVASTATIN (PRAVACHOL) 80 MG TABLET    Take 80 mg by mouth every morning.   Modified Medications   No medications on file  Discontinued Medications   OXYCODONE-ACETAMINOPHEN (PERCOCET/ROXICET) 5-325 MG PER TABLET    Take 1-2 tablets by mouth every 4 (four) hours as needed for moderate pain.   PRAVASTATIN (PRAVACHOL) 80 MG TABLET    TAKE 1 TABLET BY MOUTH EVERY DAY     Physical Exam: Filed Vitals:   05/04/13 0737  BP: 132/80  Pulse: 72  Temp: 97.4 F (36.3 C)  TempSrc: Oral  Resp: 18  Height: 5\' 6"  (1.676 m)  Weight: 192 lb 3.2 oz (87.181 kg)  SpO2: 97%  Physical Exam  Constitutional: She is oriented to person, place, and time. She appears well-developed and well-nourished. No distress.  Cardiovascular: Normal rate, regular rhythm, normal heart sounds and intact distal pulses.   Pulmonary/Chest: Effort normal and breath sounds normal. No respiratory distress.  Abdominal: Soft. Bowel sounds are normal. She exhibits no distension and no mass. There is no tenderness.  Musculoskeletal:  Walks with limp with nerve pain down left leg  Neurological: She is alert and oriented to person, place, and time.  Skin: Skin is warm and dry.  Psychiatric: She has a normal mood and affect.   Labs reviewed: Basic Metabolic Panel:  Recent Labs  02/02/13 0523 02/03/13 0508 03/28/13 1425  NA 138 138 137  K 3.8 3.5* 4.1  CL 101 101 99  CO2 25 24 26   GLUCOSE 132* 138* 114*  BUN 10 13 12   CREATININE 0.65 0.79 0.74  CALCIUM 8.6 8.4 10.0   Liver Function Tests:  Recent Labs  06/02/12 1025 01/26/13 1040  AST 17 15  ALT 11 17  ALKPHOS 66  65  BILITOT 0.4 0.3  PROT 7.1 7.4  ALBUMIN  --  3.9  CBC:  Recent Labs  06/02/12 1025  02/01/13 0645  02/03/13 0508 02/04/13 0506 03/28/13 1425  WBC 8.6  < > 13.4*  < > 15.1* 12.7* 14.0*  NEUTROABS 5.8  --  9.0*  --   --   --   --   HGB 13.1  < > 13.8  < > 9.7* 9.5* 12.2  HCT 39.2  < > 40.7  < > 29.8* 28.9* 37.3  MCV 85  < > 86.6  < >  87.9 87.8 82.7  PLT  --   < > 274  < > 213 230 382  < > = values in this interval not displayed. Lipid Panel:  Recent Labs  06/02/12 1025 12/26/12 0810  HDL 36* 31*  LDLCALC 105* 104*  TRIG 93 108  CHOLHDL 4.4 5.1*   Lab Results  Component Value Date   HGBA1C 6.7* 12/26/2012    Past Procedures: Latest portable spine, 1-view:  Single portable intraoperative lateral radiograph of the lumbar spine. There are tissue retractors posteriorly with a metallic probe tip projecting over the posterior element of L4. ADDENDUM: COMPARISON: DG SPINE PORTABLE 1 VIEW dated 03/30/2013; DG SPINE PORTABLE 1 VIEW dated 03/30/2013; DG LUMBAR SPINE 2-3 VIEWS dated  03/30/2013  Assessment/Plan 1. Synovial cyst of lumbar spine - was removed--s/p lumbar laminectomy and microdiscectomy -keep ortho f/u  -has been advised to hold off on outpatient therapy due to her nerve pain -cont gabapentin and norco as needed - CBC With differential/Platelet f/u due to acute blood loss anemia postop  2. Osteoarthritis of right knee -prior knee replacement, but has been having significant pain in this knee since she has been seeing me the past 3 years--intervention for this has been put off even longer due to her recently discovered cyst and back surgery - CBC With differential/Platelet  3. Type II or unspecified type diabetes mellitus without mention of complication, uncontrolled -says glucose has been erratic since surgery and need to eat with pills to prevent nausea - Comprehensive metabolic panel - Hemoglobin A1c - Lipid panel  4. Benign essential hypertension -bp at  goal with current therapy--no changes  5. Tobacco abuse -continues to smoke and not ready to quit despite risks  6. Obesity, unspecified -has lost a couple of lbs per records -cont working on this  7. Acute blood loss anemia -f/u cbc - CBC With differential/Platelet  8. History of lumbar laminectomy for spinal cord decompression - continue to f/u with Dr. Gladstone Lighter  9. Need for shingles vaccine -script given - zoster vaccine live, PF, (ZOSTAVAX) 62563 UNT/0.65ML injection; Inject 19,400 Units into the skin once.  Dispense: 1 each; Refill: 0  10. Other screening mammogram -previously done at [facility]; Future  Labs/tests ordered: Orders Placed This Encounter  Procedures  . MM DIGITAL SCREENING BILATERAL    Standing Status: Future     Number of Occurrences:      Standing Expiration Date: 07/04/2014    Order Specific Question:  Reason for exam:    Answer:  routine annual screening    Order Specific Question:  Preferred imaging location?    Answer:  External     Comments:  [facility]  . CBC With differential/Platelet  . Comprehensive metabolic panel  . Hemoglobin A1c  . Lipid panel    Next appt:  3 mos

## 2013-05-05 ENCOUNTER — Other Ambulatory Visit: Payer: Self-pay | Admitting: Internal Medicine

## 2013-05-05 LAB — COMPREHENSIVE METABOLIC PANEL
ALT: 16 IU/L (ref 0–32)
AST: 16 IU/L (ref 0–40)
Albumin/Globulin Ratio: 1.7 (ref 1.1–2.5)
Albumin: 4.6 g/dL (ref 3.6–4.8)
Alkaline Phosphatase: 85 IU/L (ref 39–117)
BUN/Creatinine Ratio: 13 (ref 11–26)
BUN: 9 mg/dL (ref 8–27)
CO2: 25 mmol/L (ref 18–29)
Calcium: 10.1 mg/dL (ref 8.7–10.3)
Chloride: 102 mmol/L (ref 97–108)
Creatinine, Ser: 0.7 mg/dL (ref 0.57–1.00)
GFR calc Af Amer: 103 mL/min/{1.73_m2} (ref >59)
GFR calc non Af Amer: 89 mL/min/{1.73_m2} (ref >59)
Globulin, Total: 2.7 g/dL (ref 1.5–4.5)
Glucose: 99 mg/dL (ref 65–99)
Potassium: 4.5 mmol/L (ref 3.5–5.2)
Sodium: 142 mmol/L (ref 134–144)
Total Bilirubin: 0.3 mg/dL (ref 0.0–1.2)
Total Protein: 7.3 g/dL (ref 6.0–8.5)

## 2013-05-05 LAB — HEMOGLOBIN A1C
Est. average glucose Bld gHb Est-mCnc: 134 mg/dL
Hgb A1c MFr Bld: 6.3 % — ABNORMAL HIGH (ref 4.8–5.6)

## 2013-05-05 LAB — CBC WITH DIFFERENTIAL
Basophils Absolute: 0 10*3/uL (ref 0.0–0.2)
Basos: 0 %
Eos: 1 %
Eosinophils Absolute: 0.2 10*3/uL (ref 0.0–0.4)
HCT: 39.3 % (ref 34.0–46.6)
Hemoglobin: 12.4 g/dL (ref 11.1–15.9)
Immature Grans (Abs): 0 10*3/uL (ref 0.0–0.1)
Immature Granulocytes: 0 %
Lymphocytes Absolute: 2.1 10*3/uL (ref 0.7–3.1)
Lymphs: 18 %
MCH: 26.1 pg — ABNORMAL LOW (ref 26.6–33.0)
MCHC: 31.6 g/dL (ref 31.5–35.7)
MCV: 83 fL (ref 79–97)
Monocytes Absolute: 0.7 10*3/uL (ref 0.1–0.9)
Monocytes: 6 %
Neutrophils Absolute: 8.6 10*3/uL — ABNORMAL HIGH (ref 1.4–7.0)
Neutrophils Relative %: 75 %
Platelets: 467 10*3/uL — ABNORMAL HIGH (ref 150–379)
RBC: 4.75 x10E6/uL (ref 3.77–5.28)
RDW: 15.6 % — ABNORMAL HIGH (ref 12.3–15.4)
WBC: 11.6 10*3/uL — ABNORMAL HIGH (ref 3.4–10.8)

## 2013-05-05 LAB — LIPID PANEL
Chol/HDL Ratio: 3.9 ratio units (ref 0.0–4.4)
Cholesterol, Total: 155 mg/dL (ref 100–199)
HDL: 40 mg/dL (ref >39)
LDL Calculated: 97 mg/dL (ref 0–99)
Triglycerides: 91 mg/dL (ref 0–149)
VLDL Cholesterol Cal: 18 mg/dL (ref 5–40)

## 2013-05-08 DIAGNOSIS — Z1231 Encounter for screening mammogram for malignant neoplasm of breast: Secondary | ICD-10-CM | POA: Diagnosis not present

## 2013-05-31 ENCOUNTER — Encounter: Payer: Self-pay | Admitting: Internal Medicine

## 2013-07-28 ENCOUNTER — Other Ambulatory Visit: Payer: Self-pay | Admitting: Internal Medicine

## 2013-08-03 ENCOUNTER — Ambulatory Visit: Payer: Medicare Other | Admitting: Internal Medicine

## 2013-08-28 ENCOUNTER — Ambulatory Visit (INDEPENDENT_AMBULATORY_CARE_PROVIDER_SITE_OTHER): Payer: Medicare Other | Admitting: Internal Medicine

## 2013-08-28 ENCOUNTER — Encounter: Payer: Self-pay | Admitting: Internal Medicine

## 2013-08-28 VITALS — BP 142/84 | HR 86 | Temp 98.1°F | Ht 65.0 in | Wt 194.2 lb

## 2013-08-28 DIAGNOSIS — I10 Essential (primary) hypertension: Secondary | ICD-10-CM

## 2013-08-28 DIAGNOSIS — Z7189 Other specified counseling: Secondary | ICD-10-CM

## 2013-08-28 DIAGNOSIS — E1142 Type 2 diabetes mellitus with diabetic polyneuropathy: Secondary | ICD-10-CM

## 2013-08-28 DIAGNOSIS — Z720 Tobacco use: Secondary | ICD-10-CM

## 2013-08-28 DIAGNOSIS — R739 Hyperglycemia, unspecified: Secondary | ICD-10-CM

## 2013-08-28 DIAGNOSIS — E1149 Type 2 diabetes mellitus with other diabetic neurological complication: Secondary | ICD-10-CM | POA: Diagnosis not present

## 2013-08-28 DIAGNOSIS — F17200 Nicotine dependence, unspecified, uncomplicated: Secondary | ICD-10-CM

## 2013-08-28 DIAGNOSIS — D62 Acute posthemorrhagic anemia: Secondary | ICD-10-CM | POA: Diagnosis not present

## 2013-08-28 DIAGNOSIS — M1710 Unilateral primary osteoarthritis, unspecified knee: Secondary | ICD-10-CM

## 2013-08-28 DIAGNOSIS — R7309 Other abnormal glucose: Secondary | ICD-10-CM

## 2013-08-28 DIAGNOSIS — M1711 Unilateral primary osteoarthritis, right knee: Secondary | ICD-10-CM

## 2013-08-28 DIAGNOSIS — Z23 Encounter for immunization: Secondary | ICD-10-CM

## 2013-08-28 DIAGNOSIS — D72829 Elevated white blood cell count, unspecified: Secondary | ICD-10-CM

## 2013-08-28 DIAGNOSIS — Z716 Tobacco abuse counseling: Secondary | ICD-10-CM

## 2013-08-28 MED ORDER — METFORMIN HCL 500 MG PO TABS: ORAL_TABLET | ORAL | Status: DC

## 2013-08-28 MED ORDER — TETANUS-DIPHTH-ACELL PERTUSSIS 5-2-15.5 LF-MCG/0.5 IM SUSP: 0.5000 mL | Freq: Once | INTRAMUSCULAR | Status: DC

## 2013-08-28 MED ORDER — PRAVASTATIN SODIUM 80 MG PO TABS: 80.0000 mg | ORAL_TABLET | Freq: Every morning | ORAL | Status: DC

## 2013-08-28 MED ORDER — LISINOPRIL 5 MG PO TABS: 5.0000 mg | ORAL_TABLET | Freq: Every day | ORAL | Status: DC

## 2013-08-28 MED ORDER — AMLODIPINE BESYLATE 10 MG PO TABS: ORAL_TABLET | ORAL | Status: DC

## 2013-08-28 MED ORDER — CELECOXIB 200 MG PO CAPS: ORAL_CAPSULE | ORAL | Status: DC

## 2013-08-28 NOTE — Patient Instructions (Signed)
Stop your amlodipine Start lisinopril 5mg  daily Check your blood pressure daily for a week at least 30 mins after taking the lisinopril Call us with results If you are staying over 140/90, we will need to increase the dose of lisinopril

## 2013-08-28 NOTE — Progress Notes (Signed)
Patient ID: Alison [name], female   DOB: 1945-10-27, 68 y.o.   MRN: [id number]   Location:  [facility]  Code Status: full code, have previously given info about HCPOA and living will, but has not done this  No Known Allergies  Chief Complaint  Patient presents with  . Follow-up    3 month f/u DM & smoking (no labs)  . other    neg fall screening,  . Immunizations    needs Tdap rx    HPI: Patient is a 68 y.o. black female seen in the office today for medical mgt of chronic diseases primarily to f/u on diabetes and smoking.  She is still smoking.  Not smoking any less either.    Has lost weight since may of last year (was 218 then), but no more lately.    Knee is doing good, she says.  She didn't exercise over the weekend like she was supposed to.  Exercises on bike.  Says it gets stiff and draws up.  Not as much soreness now.  Gets a little off balance.  Has numbness of toes.  Does use cane sometimes.    Thinks she is due for cscope, but upon review, colonoscopy was done in 2009 and it did not show any polyps, only hemorrhoids and diverticula  Review of Systems:  Review of Systems  Constitutional: Negative for fever, chills and malaise/fatigue.  HENT: Negative for congestion.   Eyes: Negative for blurred vision.  Respiratory: Negative for shortness of breath.   Cardiovascular: Negative for chest pain and leg swelling.  Gastrointestinal: Negative for heartburn, constipation, blood in stool and melena.  Genitourinary: Negative for dysuria.  Musculoskeletal: Positive for joint pain. Negative for falls.  Neurological: Positive for sensory change. Negative for dizziness and weakness.       Off balance, numbness of toes  Endo/Heme/Allergies: Does not bruise/bleed easily.  Psychiatric/Behavioral: Negative for depression and memory loss.    Past Medical History  Diagnosis Date  . Unspecified vitamin D deficiency   . Obesity, unspecified     . Goiter, specified as simple   . Pernicious anemia   . Hyperlipidemia LDL goal < 100   . Unspecified glaucoma   . Benign essential hypertension   . Diverticulitis of colon (without mention of hemorrhage)   . Osteoarthrosis, unspecified whether generalized or localized, lower leg   . Osteopenia   . Numbness     TOES RT FOOT and toes left foot  . Diabetes mellitus without complication   . Other specified disease of nail     FUNGUS OF FINGERNAILS    Past Surgical History  Procedure Laterality Date  . Total knee arthroplasty Right 02/01/2013    Procedure: RIGHT TOTAL KNEE ARTHROPLASTY;  Surgeon: Tobi Bastos, MD;  Location: [facility];  Service: Orthopedics;  Laterality: Right;  . Colectomy  2009  . 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  She has never used smokeless tobacco. She reports that she does not drink alcohol or use illicit drugs.  Family History  Problem Relation Age of Onset  . Diabetes Sister     Medications: Patient's Medications  New Prescriptions   No medications on file  Previous Medications  AMLODIPINE (NORVASC) 10 MG TABLET    TAKE 1 TABLET BY MOUTH ONCE DAILY FOR BLOOD PRESSURE AND TO PROTECT HEART   ASPIRIN 81 MG TABLET    Take 81 mg by mouth daily.   CARBOXYMETHYLCELLULOSE (REFRESH PLUS) 0.5 % SOLN    Place 1 drop into both eyes 3 (three) times daily as needed (dry eyes).    CELECOXIB (CELEBREX) 200 MG CAPSULE    TAKE 1 CAPSULE BY MOUTH ONCE DAILY FOR KNEE PAIN   FERROUS SULFATE 325 (65 FE) MG TABLET    Take 325 mg by mouth 2 (two) times daily with a meal.   LATANOPROST (XALATAN) 0.005 % OPHTHALMIC SOLUTION    INSTILL 1 DROP IN BOTH EYES EVERY DAY AT BEDTIME   METFORMIN (GLUCOPHAGE) 500 MG TABLET     TAKE 1/2 TABLET BY MOUTH EVERY EVENING AT SUPPER   MULTIPLE VITAMIN (MULTIVITAMIN WITH MINERALS) TABS TABLET    Take 1 tablet by mouth daily.   OMEGA-3 ACID ETHYL ESTERS (LOVAZA) 1 G CAPSULE    Take 2 g by mouth daily.   PRAVASTATIN (PRAVACHOL) 80 MG TABLET    Take 80 mg by mouth every morning.    TDAP (ADACEL) 05-13-13.5 LF-MCG/0.5 INJECTION    Inject 0.5 mLs into the muscle once.  Modified Medications   No medications on file  Discontinued Medications   GABAPENTIN (NEURONTIN) 300 MG CAPSULE    Take 300 mg by mouth 3 (three) times daily.   HYDROCODONE-ACETAMINOPHEN (NORCO/VICODIN) 5-325 MG PER TABLET    Take 1 tablet by mouth 2 (two) times daily.   METHOCARBAMOL (ROBAXIN) 500 MG TABLET    Take 1 tablet (500 mg total) by mouth every 8 (eight) hours as needed for muscle spasms.   ZOSTER VACCINE LIVE, PF, (ZOSTAVAX) 24235 UNT/0.65ML INJECTION    Inject 19,400 Units into the skin once.     Physical Exam: Filed Vitals:   08/28/13 0957  BP: 142/84  Pulse: 86  Temp: 98.1 F (36.7 C)  TempSrc: Oral  Height: 5\' 5"  (1.651 m)  Weight: 194 lb 3.2 oz (88.089 kg)  SpO2: 97%  Physical Exam  Constitutional: She is oriented to person, place, and time. She appears well-developed and well-nourished. No distress.  Cardiovascular: Normal rate, regular rhythm, normal heart sounds and intact distal pulses.   Pulmonary/Chest: Effort normal and breath sounds normal. No respiratory distress.  Abdominal: Soft. Bowel sounds are normal. She exhibits no distension and no mass. There is no tenderness.  Musculoskeletal: Normal range of motion. She exhibits no edema and no tenderness.  Neurological: She is alert and oriented to person, place, and time.  Skin: Skin is warm and dry.  Psychiatric: She has a normal mood and affect.    Labs reviewed: Basic Metabolic Panel:  Recent Labs  02/03/13 0508 03/28/13 1425 05/04/13 0820  NA 138 137 142  K 3.5* 4.1 4.5  CL 101 99 102  CO2 24 26 25   GLUCOSE 138* 114*  99  BUN 13 12 9   CREATININE 0.79 0.74 0.70  CALCIUM 8.4 10.0 10.1   Liver Function Tests:  Recent Labs  01/26/13 1040 05/04/13 0820  AST 15 16  ALT 17 16  ALKPHOS 65 85  BILITOT 0.3 0.3  PROT 7.4 7.3  ALBUMIN 3.9  --   CBC:  Recent Labs  02/01/13 0645  02/04/13 0506 03/28/13 1425 05/04/13 0820  WBC 13.4*  < > 12.7* 14.0* 11.6*  NEUTROABS 9.0*  --   --   --  8.6*  HGB 13.8  < > 9.5* 12.2 12.4  HCT 40.7  < > 28.9* 37.3 39.3  MCV 86.6  < > 87.8 82.7 83  PLT 274  < > 230 382 467*  < > = values in this interval not displayed. Lipid Panel:  Recent Labs  12/26/12 0810 05/04/13 0820  HDL 31* 40  LDLCALC 104* 97  TRIG 108 91  CHOLHDL 5.1* 3.9   Lab Results  Component Value Date   HGBA1C 6.3* 05/04/2013     Assessment/Plan 1. Diabetic polyneuropathy associated with type 2 diabetes mellitus - reassess level of control today, cont metformin, renal function is good, but not on ace so will d/c amlodipine and start on lisinopril 5mg  - Comprehensive metabolic panel - Hemoglobin A1c - TSH - lisinopril (PRINIVIL,ZESTRIL) 5 MG tablet; Take 1 tablet (5 mg total) by mouth daily.  Dispense: 90 tablet; Refill: 3  2. Benign essential hypertension -stop norvasc, start lisinopril 5mg  daily, check bp daily for a week and call us with results--goal is <140/90--if not at goal, will need to increase lisinopril dose -will need bmp at next visit  3. Primary osteoarthritis of right knee -s/p TKA, gets stiff, but does pretty well if she actually exercises  4. Tobacco abuse counseling -again discussed extensively how important it is that she quit smoking due to its detrimental effects on her bp and increases her risk of stroke and mi  5. Tobacco abuse -continues, has not reduced amt  6. Hyperglycemia -hba1c recheck  7. Need for prophylactic vaccination with combined diphtheria-tetanus-pertussis (DTP) vaccine - Tdap (ADACEL) 05-13-13.5 LF-MCG/0.5 injection; Inject 0.5 mLs into the  muscle once.  Dispense: 0.5 mL; Refill: 0  8. Postoperative anemia due to acute blood loss - f/u h/h, taking iron--may be able to stop this now - CBC With differential/Platelet  9. Leukocytosis, unspecified - will reassess--had been improving last eval - CBC With differential/Platelet - TSH  Labs/tests ordered:   Orders Placed This Encounter  Procedures  . CBC With differential/Platelet  . Comprehensive metabolic panel  . Hemoglobin A1c  . TSH    Next appt:  3 mos with labs before

## 2013-08-29 LAB — CBC WITH DIFFERENTIAL
Basophils Absolute: 0 10*3/uL (ref 0.0–0.2)
Basos: 0 %
Eos: 1 %
Eosinophils Absolute: 0.1 10*3/uL (ref 0.0–0.4)
HCT: 39.7 % (ref 34.0–46.6)
Hemoglobin: 13 g/dL (ref 11.1–15.9)
Immature Grans (Abs): 0 10*3/uL (ref 0.0–0.1)
Immature Granulocytes: 0 %
Lymphocytes Absolute: 2.2 10*3/uL (ref 0.7–3.1)
Lymphs: 23 %
MCH: 27.7 pg (ref 26.6–33.0)
MCHC: 32.7 g/dL (ref 31.5–35.7)
MCV: 85 fL (ref 79–97)
Monocytes Absolute: 0.8 10*3/uL (ref 0.1–0.9)
Monocytes: 8 %
Neutrophils Absolute: 6.4 10*3/uL (ref 1.4–7.0)
Neutrophils Relative %: 68 %
Platelets: 375 10*3/uL (ref 150–379)
RBC: 4.7 x10E6/uL (ref 3.77–5.28)
RDW: 15.1 % (ref 12.3–15.4)
WBC: 9.6 10*3/uL (ref 3.4–10.8)

## 2013-08-29 LAB — COMPREHENSIVE METABOLIC PANEL
ALT: 16 IU/L (ref 0–32)
AST: 18 IU/L (ref 0–40)
Albumin/Globulin Ratio: 1.7 (ref 1.1–2.5)
Albumin: 4.4 g/dL (ref 3.6–4.8)
Alkaline Phosphatase: 77 IU/L (ref 39–117)
BUN/Creatinine Ratio: 15 (ref 11–26)
BUN: 12 mg/dL (ref 8–27)
CO2: 24 mmol/L (ref 18–29)
Calcium: 9.8 mg/dL (ref 8.7–10.3)
Chloride: 102 mmol/L (ref 97–108)
Creatinine, Ser: 0.79 mg/dL (ref 0.57–1.00)
GFR calc Af Amer: 89 mL/min/{1.73_m2} (ref >59)
GFR calc non Af Amer: 77 mL/min/{1.73_m2} (ref >59)
Globulin, Total: 2.6 g/dL (ref 1.5–4.5)
Glucose: 83 mg/dL (ref 65–99)
Potassium: 4.4 mmol/L (ref 3.5–5.2)
Sodium: 140 mmol/L (ref 134–144)
Total Bilirubin: 0.3 mg/dL (ref 0.0–1.2)
Total Protein: 7 g/dL (ref 6.0–8.5)

## 2013-08-29 LAB — HEMOGLOBIN A1C
Est. average glucose Bld gHb Est-mCnc: 131 mg/dL
Hgb A1c MFr Bld: 6.2 % — ABNORMAL HIGH (ref 4.8–5.6)

## 2013-08-29 LAB — TSH: TSH: 1.17 u[IU]/mL (ref 0.450–4.500)

## 2013-09-01 ENCOUNTER — Ambulatory Visit: Payer: Medicare Other | Admitting: Internal Medicine

## 2013-10-26 ENCOUNTER — Other Ambulatory Visit: Payer: Self-pay | Admitting: Internal Medicine

## 2013-10-27 DIAGNOSIS — H2513 Age-related nuclear cataract, bilateral: Secondary | ICD-10-CM | POA: Diagnosis not present

## 2013-10-27 DIAGNOSIS — H18413 Arcus senilis, bilateral: Secondary | ICD-10-CM | POA: Diagnosis not present

## 2013-10-27 DIAGNOSIS — H11153 Pinguecula, bilateral: Secondary | ICD-10-CM | POA: Diagnosis not present

## 2013-10-27 DIAGNOSIS — H40053 Ocular hypertension, bilateral: Secondary | ICD-10-CM | POA: Diagnosis not present

## 2013-10-27 DIAGNOSIS — I10 Essential (primary) hypertension: Secondary | ICD-10-CM | POA: Diagnosis not present

## 2013-10-28 DIAGNOSIS — Z23 Encounter for immunization: Secondary | ICD-10-CM | POA: Diagnosis not present

## 2013-11-06 LAB — HM DIABETES EYE EXAM

## 2013-12-01 ENCOUNTER — Ambulatory Visit: Payer: Medicare Other | Admitting: Internal Medicine

## 2013-12-28 ENCOUNTER — Ambulatory Visit: Payer: Medicare Other | Admitting: Internal Medicine

## 2014-01-04 ENCOUNTER — Encounter: Payer: Self-pay | Admitting: Internal Medicine

## 2014-01-04 ENCOUNTER — Ambulatory Visit (INDEPENDENT_AMBULATORY_CARE_PROVIDER_SITE_OTHER): Payer: Medicare Other | Admitting: Internal Medicine

## 2014-01-04 VITALS — BP 120/82 | HR 88 | Temp 98.0°F | Resp 18 | Ht 65.0 in | Wt 193.8 lb

## 2014-01-04 DIAGNOSIS — M1711 Unilateral primary osteoarthritis, right knee: Secondary | ICD-10-CM

## 2014-01-04 DIAGNOSIS — Z23 Encounter for immunization: Secondary | ICD-10-CM | POA: Diagnosis not present

## 2014-01-04 DIAGNOSIS — Z720 Tobacco use: Secondary | ICD-10-CM

## 2014-01-04 DIAGNOSIS — E1142 Type 2 diabetes mellitus with diabetic polyneuropathy: Secondary | ICD-10-CM

## 2014-01-04 DIAGNOSIS — E1169 Type 2 diabetes mellitus with other specified complication: Secondary | ICD-10-CM

## 2014-01-04 DIAGNOSIS — H832X1 Labyrinthine dysfunction, right ear: Secondary | ICD-10-CM

## 2014-01-04 DIAGNOSIS — I10 Essential (primary) hypertension: Secondary | ICD-10-CM

## 2014-01-04 DIAGNOSIS — E785 Hyperlipidemia, unspecified: Secondary | ICD-10-CM

## 2014-01-04 MED ORDER — PRAVASTATIN SODIUM 80 MG PO TABS: 80.0000 mg | ORAL_TABLET | Freq: Every morning | ORAL | Status: DC

## 2014-01-04 MED ORDER — METFORMIN HCL 500 MG PO TABS: ORAL_TABLET | ORAL | Status: DC

## 2014-01-04 MED ORDER — PNEUMOCOCCAL 13-VAL CONJ VACC IM SUSP
0.5000 mL | INTRAMUSCULAR | Status: AC
  Administered 2014-01-04: 0.5 mL via INTRAMUSCULAR

## 2014-01-04 MED ORDER — OMEGA-3-ACID ETHYL ESTERS 1 G PO CAPS: 2.0000 g | ORAL_CAPSULE | Freq: Every day | ORAL | Status: DC

## 2014-01-04 MED ORDER — FERROUS SULFATE 325 (65 FE) MG PO TABS: 325.0000 mg | ORAL_TABLET | Freq: Two times a day (BID) | ORAL | Status: DC

## 2014-01-04 MED ORDER — CELECOXIB 200 MG PO CAPS: ORAL_CAPSULE | ORAL | Status: DC

## 2014-01-04 MED ORDER — LISINOPRIL 5 MG PO TABS: 5.0000 mg | ORAL_TABLET | Freq: Every day | ORAL | Status: DC

## 2014-01-04 MED ORDER — ADULT MULTIVITAMIN W/MINERALS CH: 1.0000 | ORAL_TABLET | Freq: Every day | ORAL | Status: DC

## 2014-01-04 NOTE — Progress Notes (Signed)
Patient ID: Alison [name], female   DOB: 1945/07/07, 68 y.o.   MRN: [id number]   Location:  [facility] [place]   No Known Allergies  Chief Complaint  Patient presents with  . Medical Management of Chronic Issues    HPI: Patient is a 68 y.o. black female seen in the office today for med mgt of chronic diseases.    Just had eye exam--goes twice a year.  Went in November--Dr. Katheran James (sp?).  She will call back with the date.  Still smoking about the same.    Falls off wagon and gets back on.  Is trying to do the right thing with the diet.  Has been "sorta" doing the plate method.  Eating fewer potatoes and rice.  Her sweet tooth is what kills her.    Nerve pain, numbness is still there in the feet.  Also still is off balance some.  Has tingling across both feet on toes.  Seems like it happened more after the knee operation.  It still feels funny.  Wasn't able to go to therapy so she thinks that's why it's taking so long to feel completely better.  Feels like she goes off to the side when walking.  Not severe, but enough where she feels like she has to touch the wall.  Gets right ear wax buildup.  No tinnitus.  Sometimes does hear a little noise in there.    Otherwise really doesn't feel bad.    Has to get her other knee done (left).  Had right done before.    Review of Systems:  Review of Systems  Constitutional: Negative for fever, chills and weight loss.  HENT: Negative for congestion.   Eyes: Negative for blurred vision.  Respiratory: Negative for shortness of breath.   Cardiovascular: Negative for chest pain, palpitations and leg swelling.  Gastrointestinal: Negative for abdominal pain, blood in stool and melena.  Genitourinary: Negative for dysuria, urgency and frequency.  Musculoskeletal: Positive for back pain and joint pain. Negative for myalgias and falls.  Skin: Negative for rash.  Neurological: Positive for dizziness, tingling and  sensory change. Negative for loss of consciousness and weakness.  Endo/Heme/Allergies: Does not bruise/bleed easily.  Psychiatric/Behavioral: Negative for depression and memory loss.     Past Medical History  Diagnosis Date  . Unspecified vitamin D deficiency   . Obesity, unspecified   . Goiter, specified as simple   . Pernicious anemia   . Hyperlipidemia LDL goal < 100   . Unspecified glaucoma   . Benign essential hypertension   . Diverticulitis of colon (without mention of hemorrhage)   . Osteoarthrosis, unspecified whether generalized or localized, lower leg   . Osteopenia   . Numbness     TOES RT FOOT and toes left foot  . Diabetes mellitus without complication   . Other specified disease of nail     FUNGUS OF FINGERNAILS    Past Surgical History  Procedure Laterality Date  . Total knee arthroplasty Right 02/01/2013    Procedure: RIGHT TOTAL KNEE ARTHROPLASTY;  Surgeon: Tobi Bastos, MD;  Location: [facility];  Service: Orthopedics;  Laterality: Right;  . Colectomy  2009  . 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  She has never used smokeless tobacco. She reports that she does not drink alcohol or use illicit drugs.  Family History  Problem Relation Age of Onset  . Diabetes Sister     Medications: Patient's Medications  New Prescriptions   No medications on file  Previous Medications   ASPIRIN 81 MG TABLET    Take 81 mg by mouth daily.   CARBOXYMETHYLCELLULOSE (REFRESH PLUS) 0.5 % SOLN    Place 1 drop into both eyes 3 (three) times daily as needed (dry eyes).    CELECOXIB (CELEBREX) 200 MG CAPSULE    TAKE 1 CAPSULE BY MOUTH ONCE DAILY FOR KNEE PAIN   FERROUS SULFATE 325 (65 FE) MG TABLET    Take 325 mg by mouth  2 (two) times daily with a meal.   LATANOPROST (XALATAN) 0.005 % OPHTHALMIC SOLUTION    INSTILL 1 DROP IN BOTH EYES EVERY DAY AT BEDTIME   LISINOPRIL (PRINIVIL,ZESTRIL) 5 MG TABLET    Take 1 tablet (5 mg total) by mouth daily.   METFORMIN (GLUCOPHAGE) 500 MG TABLET    TAKE 1/2 TABLET BY MOUTH EVERY EVENING AT SUPPER   MULTIPLE VITAMIN (MULTIVITAMIN WITH MINERALS) TABS TABLET    Take 1 tablet by mouth daily.   OMEGA-3 ACID ETHYL ESTERS (LOVAZA) 1 G CAPSULE    Take 2 g by mouth daily.   PRAVASTATIN (PRAVACHOL) 80 MG TABLET    Take 1 tablet (80 mg total) by mouth every morning.   TDAP (ADACEL) 05-13-13.5 LF-MCG/0.5 INJECTION    Inject 0.5 mLs into the muscle once.  Modified Medications   No medications on file  Discontinued Medications   CELECOXIB (CELEBREX) 200 MG CAPSULE    TAKE ONE CAPSULE BY MOUTH ONCE DAILY FOR KNEE PAIN     Physical Exam: Filed Vitals:   01/04/14 0812  BP: 120/82  Pulse: 88  Temp: 98 F (36.7 C)  TempSrc: Oral  Resp: 18  Height: 5\' 5"  (1.651 m)  Weight: 193 lb 12.8 oz (87.907 kg)  SpO2: 98%  Physical Exam  Constitutional: She is oriented to person, place, and time. She appears well-developed and well-nourished. No distress.  HENT:  Right Ear: External ear normal.  Left Ear: External ear normal.  TMs pink bilaterally with good light reflex; no significant cerumen present or fluid at this time  Cardiovascular: Normal rate, regular rhythm, normal heart sounds and intact distal pulses.   Pulmonary/Chest: Effort normal and breath sounds normal. No respiratory distress.  Abdominal: Soft. Bowel sounds are normal. She exhibits no distension and no mass. There is no tenderness.  Musculoskeletal: Normal range of motion.  Walks with cane  Neurological: She is alert and oriented to person, place, and time.  Skin: Skin is warm and dry.  Psychiatric: She has a normal mood and affect.    Labs reviewed: Basic Metabolic Panel:  Recent Labs  03/28/13 1425  05/04/13 0820 08/28/13 1044  NA 137 142 140  K 4.1 4.5 4.4  CL 99 102 102  CO2 26 25 24   GLUCOSE 114* 99 83  BUN 12 9 12   CREATININE 0.74 0.70 0.79  CALCIUM 10.0 10.1 9.8  TSH  --   --  1.170   Liver Function Tests:  Recent Labs  01/26/13 1040 05/04/13 0820 08/28/13 1044  AST 15 16 18   ALT 17 16 16   ALKPHOS 65 85 77  BILITOT 0.3 0.3 0.3  PROT 7.4 7.3 7.0  ALBUMIN 3.9  --   --  No results for input(s): LIPASE, AMYLASE in the last 8760 hours. No results for input(s): AMMONIA in the last 8760 hours. CBC:  Recent Labs  02/01/13 0645  03/28/13 1425 05/04/13 0820 08/28/13 1044  WBC 13.4*  < > 14.0* 11.6* 9.6  NEUTROABS 9.0*  --   --  8.6* 6.4  HGB 13.8  < > 12.2 12.4 13.0  HCT 40.7  < > 37.3 39.3 39.7  MCV 86.6  < > 82.7 83 85  PLT 274  < > 382 467* 375  < > = values in this interval not displayed. Lipid Panel:  Recent Labs  05/04/13 0820  HDL 40  LDLCALC 97  TRIG 91  CHOLHDL 3.9   Lab Results  Component Value Date   HGBA1C 6.2* 08/28/2013     Assessment/Plan 1. Diabetic polyneuropathy associated with type 2 diabetes mellitus - seems to contribute to symptoms -also has some neuropathic pain from her back and her knee surgeries - metFORMIN (GLUCOPHAGE) 500 MG tablet; TAKE 1/2 TABLET BY MOUTH EVERY EVENING AT SUPPER  Dispense: 90 tablet; Refill: 0 - lisinopril (PRINIVIL,ZESTRIL) 5 MG tablet; Take 1 tablet (5 mg total) by mouth daily.  Dispense: 90 tablet; Refill: 0 - CBC With differential/Platelet - Comprehensive metabolic panel - Hemoglobin A1c  2. Type 2 diabetes mellitus with diabetic polyneuropathy -control has been pretty good with her metformin -encouraged plate method for meals and diet--counseled and given hand out  - metFORMIN (GLUCOPHAGE) 500 MG tablet; TAKE 1/2 TABLET BY MOUTH EVERY EVENING AT SUPPER  Dispense: 90 tablet; Refill: 0 - CBC With differential/Platelet - Comprehensive metabolic panel - Hemoglobin A1c -is on ace, statin,  asa  3. Benign essential hypertension -bp at goal today-cont current regimen  4. Primary osteoarthritis of right knee -s/p TKA;  Now having pain in left knee -still having some neuropathic pain from right knee  5. Tobacco abuse -cont to cut back on cigarettes, but she is not ready to quit  6. Hyperlipidemia associated with type 2 diabetes mellitus -will f/u flp AT appointment next time--she was advised to come fasting  7. 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 Need for vaccination with 13-polyvalent pneumococcal conjugate vaccine -prevnar given  Labs/tests ordered:   Orders Placed This Encounter  Procedures  . CBC With differential/Platelet  . Comprehensive metabolic panel  . Hemoglobin A1c    Next appt:  3 mos with flp day of appt  Zohair Epp L. Dominyck Reser, D.O. [facility] [address], [place] [address] Cell Phone (Mon-Fri 8am-5pm):  [phone] On Call:  [phone] & follow prompts after 5pm & weekends Office Phone:  [phone] Office Fax:  [phone]

## 2014-01-04 NOTE — Patient Instructions (Signed)
Please call with date of eye appt you just had and spelling of Dr's name.

## 2014-01-04 NOTE — Addendum Note (Signed)
Addended by: Eilene Ghazi on: 01/04/2014 09:42 AM   Modules accepted: Orders

## 2014-01-04 NOTE — Addendum Note (Signed)
Addended by: Elijah Birk L on: 01/04/2014 10:50 AM   Modules accepted: Orders

## 2014-01-10 ENCOUNTER — Encounter: Payer: Self-pay | Admitting: *Deleted

## 2014-03-02 DIAGNOSIS — M1712 Unilateral primary osteoarthritis, left knee: Secondary | ICD-10-CM | POA: Diagnosis not present

## 2014-03-02 DIAGNOSIS — M5442 Lumbago with sciatica, left side: Secondary | ICD-10-CM | POA: Diagnosis not present

## 2014-03-02 DIAGNOSIS — Z96651 Presence of right artificial knee joint: Secondary | ICD-10-CM | POA: Diagnosis not present

## 2014-03-13 ENCOUNTER — Other Ambulatory Visit: Payer: Self-pay | Admitting: Surgical

## 2014-03-19 ENCOUNTER — Other Ambulatory Visit: Payer: Medicare Other

## 2014-03-19 ENCOUNTER — Other Ambulatory Visit: Payer: Self-pay | Admitting: Internal Medicine

## 2014-03-19 DIAGNOSIS — E119 Type 2 diabetes mellitus without complications: Secondary | ICD-10-CM

## 2014-03-19 NOTE — Progress Notes (Signed)
Pt showed up for labs and requested them for preop.  Has labs as ordered by anesthesia for clearance.  Has appt which was to be moved up so we could order stress test if needed well in advance of her surgery early April.

## 2014-03-20 LAB — LIPID PANEL
Chol/HDL Ratio: 3.8 ratio units (ref 0.0–4.4)
Cholesterol, Total: 150 mg/dL (ref 100–199)
HDL: 39 mg/dL — ABNORMAL LOW (ref >39)
LDL Calculated: 94 mg/dL (ref 0–99)
Triglycerides: 86 mg/dL (ref 0–149)
VLDL Cholesterol Cal: 17 mg/dL (ref 5–40)

## 2014-03-20 LAB — HEMOGLOBIN A1C
Est. average glucose Bld gHb Est-mCnc: 123 mg/dL
Hgb A1c MFr Bld: 5.9 % — ABNORMAL HIGH (ref 4.8–5.6)

## 2014-03-22 ENCOUNTER — Encounter: Payer: Self-pay | Admitting: Internal Medicine

## 2014-03-22 ENCOUNTER — Ambulatory Visit (INDEPENDENT_AMBULATORY_CARE_PROVIDER_SITE_OTHER): Payer: Medicare Other | Admitting: Internal Medicine

## 2014-03-22 VITALS — BP 122/64 | HR 69 | Temp 98.2°F | Ht 65.0 in | Wt 194.0 lb

## 2014-03-22 DIAGNOSIS — Z720 Tobacco use: Secondary | ICD-10-CM

## 2014-03-22 DIAGNOSIS — I10 Essential (primary) hypertension: Secondary | ICD-10-CM | POA: Diagnosis not present

## 2014-03-22 DIAGNOSIS — E1169 Type 2 diabetes mellitus with other specified complication: Secondary | ICD-10-CM | POA: Diagnosis not present

## 2014-03-22 DIAGNOSIS — Z01818 Encounter for other preprocedural examination: Secondary | ICD-10-CM

## 2014-03-22 DIAGNOSIS — M1712 Unilateral primary osteoarthritis, left knee: Secondary | ICD-10-CM

## 2014-03-22 DIAGNOSIS — E785 Hyperlipidemia, unspecified: Secondary | ICD-10-CM

## 2014-03-22 DIAGNOSIS — E1142 Type 2 diabetes mellitus with diabetic polyneuropathy: Secondary | ICD-10-CM

## 2014-03-22 NOTE — Progress Notes (Signed)
Patient ID: Alison [name], female   DOB: [birth year], 69 y.o.   MRN: [id number]   Location:  [facility]  Code Status: full code  No Known Allergies  Chief Complaint  Patient presents with  . Medical Clearance    Surgical clearance for left knee, last EKG 01/2013, pending order in system   . Medical Management of Chronic Issues    3 month follow-up, discuss labs (copy printed)     HPI: Patient is a 69 y.o. black female seen in the office today for medical clearance for a total knee arthroplasty.    Denies chest pain.  Can climb a couple of flights of stairs without significant shortness of breath and get herself ready in mornings w/o any dyspnea.   No claudication symptoms. Has some numbness of toes.   No visual changes, numbness or tingling of extremities otherwise.  No speech difficulty Still c/o some dizziness. Celebrex not covered on insurance so she won't be taking it anymore--has about 2 wks of it.  Will be holding her asa, vitamins as of April 1st.   Has been eating more veggies and brown rice.  Has a sweet tooth for chocolate chips.  Also likes diet cokes, but has only one per day.   Still smokes 1/2ppd and not ready to quit.  Review of Systems:  Review of Systems  Constitutional: Negative for fever and chills.  HENT: Negative for congestion.   Eyes: Negative for blurred vision.  Respiratory: Negative for cough and shortness of breath.   Cardiovascular: Negative for chest pain, palpitations, orthopnea, claudication, leg swelling and PND.  Gastrointestinal: Negative for abdominal pain, constipation, blood in stool and melena.  Genitourinary: Negative for dysuria.  Musculoskeletal: Positive for joint pain. Negative for falls.       Left knee, some back pain, right knee feels tight  Skin: Negative for rash.  Neurological: Positive for dizziness and sensory change. Negative for loss of consciousness.       In toes    Psychiatric/Behavioral: Negative for depression and memory loss.     Past Medical History  Diagnosis Date  . Unspecified vitamin D deficiency   . Obesity, unspecified   . Goiter, specified as simple   . Pernicious anemia   . Hyperlipidemia LDL goal < 100   . Unspecified glaucoma   . Benign essential hypertension   . Diverticulitis of colon (without mention of hemorrhage)   . Osteoarthrosis, unspecified whether generalized or localized, lower leg   . Osteopenia   . Numbness     TOES RT FOOT and toes left foot  . Diabetes mellitus without complication   . Other specified disease of nail     FUNGUS OF FINGERNAILS    Past Surgical History  Procedure Laterality Date  . Total knee arthroplasty Right 02/01/2013    Procedure: RIGHT TOTAL KNEE ARTHROPLASTY;  Surgeon: Tobi Bastos, MD;  Location: [facility];  Service: Orthopedics;  Laterality: Right;  . Colectomy  2009  . 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  She has never used smokeless tobacco. She reports that she does not drink alcohol or use illicit drugs.  Family History  Problem Relation Age of Onset  . Diabetes  Sister     Medications: Patient's Medications  New Prescriptions   No medications on file  Previous Medications   ASPIRIN 81 MG TABLET    Take 81 mg by mouth daily.   CARBOXYMETHYLCELLULOSE (REFRESH PLUS) 0.5 % SOLN    Place 1 drop into both eyes 3 (three) times daily as needed (dry eyes).    CELECOXIB (CELEBREX) 200 MG CAPSULE    TAKE 1 CAPSULE BY MOUTH ONCE DAILY FOR KNEE PAIN   FERROUS SULFATE 325 (65 FE) MG TABLET    Take 1 tablet (325 mg total) by mouth 2 (two) times daily with a meal.   HYDROCODONE-ACETAMINOPHEN (NORCO/VICODIN) 5-325 MG PER TABLET     Take 1 tablet by mouth as needed for moderate pain (R'ed by specialist).   LATANOPROST (XALATAN) 0.005 % OPHTHALMIC SOLUTION    INSTILL 1 DROP IN BOTH EYES EVERY DAY AT BEDTIME   LISINOPRIL (PRINIVIL,ZESTRIL) 5 MG TABLET    Take 1 tablet (5 mg total) by mouth daily.   METFORMIN (GLUCOPHAGE) 500 MG TABLET    TAKE 1/2 TABLET BY MOUTH EVERY EVENING AT SUPPER   MULTIPLE VITAMIN (MULTIVITAMIN WITH MINERALS) TABS TABLET    Take 1 tablet by mouth daily.   OMEGA-3 ACID ETHYL ESTERS (LOVAZA) 1 G CAPSULE    Take 2 capsules (2 g total) by mouth daily.   PRAVASTATIN (PRAVACHOL) 80 MG TABLET    Take 1 tablet (80 mg total) by mouth every morning.   TDAP (ADACEL) 05-13-13.5 LF-MCG/0.5 INJECTION    Inject 0.5 mLs into the muscle once.  Modified Medications   No medications on file  Discontinued Medications   No medications on file     Physical Exam: Filed Vitals:   03/22/14 1052  BP: 122/64  Pulse: 69  Temp: 98.2 F (36.8 C)  TempSrc: Oral  Height: 5\' 5"  (1.651 m)  Weight: 194 lb (87.998 kg)  SpO2: 99%   Physical Exam  Constitutional: She is oriented to person, place, and time. She appears well-developed and well-nourished.  HENT:  Right ear pruritic, has minimal wax  Neck: Neck supple. No JVD present. No thyromegaly present.  Cardiovascular: Normal rate, regular rhythm, normal heart sounds and intact distal pulses.   Pulmonary/Chest: Effort normal and breath sounds normal. No respiratory distress.  Abdominal: Soft. Bowel sounds are normal. She exhibits no distension and no mass. There is no tenderness.  Musculoskeletal: She exhibits tenderness.  Of left knee  Lymphadenopathy:    She has no cervical adenopathy.  Neurological: She is alert and oriented to person, place, and time.  Skin: Skin is warm and dry.  Psychiatric: She has a normal mood and affect.     Labs reviewed: Basic Metabolic Panel:  Recent Labs  03/28/13 1425 05/04/13 0820 08/28/13 1044  NA 137 142 140  K 4.1 4.5 4.4   CL 99 102 102  CO2 26 25 24   GLUCOSE 114* 99 83  BUN 12 9 12   CREATININE 0.74 0.70 0.79  CALCIUM 10.0 10.1 9.8  TSH  --   --  1.170   Liver Function Tests:  Recent Labs  05/04/13 0820 08/28/13 1044  AST 16 18  ALT 16 16  ALKPHOS 85 77  BILITOT 0.3 0.3  PROT 7.3 7.0   No results for input(s): LIPASE, AMYLASE in the last 8760 hours. No results for input(s): AMMONIA in the last 8760 hours. CBC:  Recent Labs  03/28/13 1425 05/04/13 0820 08/28/13 1044  WBC 14.0* 11.6* 9.6  NEUTROABS  --  8.6* 6.4  HGB 12.2 12.4 13.0  HCT 37.3 39.3 39.7  MCV 82.7 83 85  PLT 382 467* 375   Lipid Panel:  Recent Labs  05/04/13 0820 03/19/14 0843  CHOL 155 150  HDL 40 39*  LDLCALC 97 94  TRIG 91 86  CHOLHDL 3.9 3.8   Lab Results  Component Value Date   HGBA1C 5.9* 03/19/2014     Assessment/Plan 1. Preoperative clearance - advised for patient to hold her aspirin 81 mg and celebrex for  5 days before her surgery; she is to hold her metformin that morning (actually only takes at supper anyway), hold supplements and fish oil also, continue lisinopril, pravachol -has EKG and a whole panel of labs already ordered for anesthesia preop clearance so duplicating today seemed unnecessary--pt has also had two other surgeries in the recent past  2. Type 2 diabetes mellitus with diabetic polyneuropathy -now well controlled with dietary changes -hopefully postop, she'll be able to begin an exercise program as well -has some peripheral neuropathy of toes -continues ace, asa, metformin daily at supper  3. Benign essential hypertension -bp at goal with ace and low sodium diet  4. Primary osteoarthritis of left knee -for left TKA  medial and lateral w/w/o patella resurfacing on 04/18/14  5. Hyperlipidemia associated with type 2 diabetes mellitus -cont pravachol and dietary changes  6. Tobacco abuse -still smoking 1/2ppd--preop is not the time to quit, but hopefully she will postop esp if  she needs SNF rehab (says this was advised against)  Labs/tests ordered:  No new (had hba1c and lipids) Next appt:  3 mos for med mgt  Engelbert Sevin L. Milad Bublitz, D.O. [facility] [address], [place] [address] Cell Phone (Mon-Fri 8am-5pm):  [phone] On Call:  [phone] & follow prompts after 5pm & weekends Office Phone:  [phone] Office Fax:  [phone]

## 2014-03-22 NOTE — Progress Notes (Signed)
Neglected to document that she had no carotid bruits during exam and is neurologically intact.

## 2014-03-27 DIAGNOSIS — M1712 Unilateral primary osteoarthritis, left knee: Secondary | ICD-10-CM | POA: Diagnosis not present

## 2014-04-09 ENCOUNTER — Other Ambulatory Visit: Payer: Medicare Other

## 2014-04-09 NOTE — Patient Instructions (Addendum)
Alison [name]  04/09/2014   Your procedure is scheduled on: Wednesday 04/18/2014  Report to [facility] Main  Entrance and follow signs to               [place] [facility] [place].  Call this number if you have problems the morning of surgery [phone]   Remember: DO NOT [place]!   Do not eat food or drink liquids :After Midnight.     Take these medicines the morning of surgery with A SIP OF WATER: none                               You may not have any metal on your body including hair pins and              piercings  Do not wear jewelry, make-up, lotions, powders or perfumes.             Do not wear nail polish.  Do not shave  48 hours prior to surgery.              Men may shave face and neck.   Do not bring valuables to the hospital. [place].  Contacts, dentures or bridgework may not be worn into surgery.  Leave suitcase in the car. After surgery it may be brought to your room.     Patients discharged the day of surgery will not be allowed to drive home.  Name and phone number of your driver:  Special Instructions: N/A              Please read over the following fact sheets you were given: _____________________________________________________________________             [facility] - Preparing for Surgery Before surgery, you can play an important role.  Because skin is not sterile, your skin needs to be as free of germs as possible.  You can reduce the number of germs on your skin by washing with CHG (chlorahexidine gluconate) soap before surgery.  CHG is an antiseptic cleaner which kills germs and bonds with the skin to continue killing germs even after washing. Please DO NOT use if you have an allergy to CHG or antibacterial soaps.  If your skin becomes reddened/irritated stop using the CHG and inform your nurse when you arrive at Short Stay. Do not  shave (including legs and underarms) for at least 48 hours prior to the first CHG shower.  You may shave your face/neck. Please follow these instructions carefully:  1.  Shower with CHG Soap the night before surgery and the  morning of Surgery.  2.  If you choose to wash your hair, wash your hair first as usual with your  normal  shampoo.  3.  After you shampoo, rinse your hair and body thoroughly to remove the  shampoo.                           4.  Use CHG as you would any other liquid soap.  You can apply chg directly  to the skin and wash  Gently with a scrungie or clean washcloth.  5.  Apply the CHG Soap to your body ONLY FROM THE NECK DOWN.   Do not use on face/ open                           Wound or open sores. Avoid contact with eyes, ears mouth and genitals (private parts).                       Wash face,  Genitals (private parts) with your normal soap.             6.  Wash thoroughly, paying special attention to the area where your surgery  will be performed.  7.  Thoroughly rinse your body with warm water from the neck down.  8.  DO NOT shower/wash with your normal soap after using and rinsing off  the CHG Soap.                9.  Pat yourself dry with a clean towel.            10.  Wear clean pajamas.            11.  Place clean sheets on your bed the night of your first shower and do not  sleep with pets. Day of Surgery : Do not apply any lotions/deodorants the morning of surgery.  Please wear clean clothes to the hospital/surgery center.  FAILURE TO FOLLOW THESE INSTRUCTIONS MAY RESULT IN THE CANCELLATION OF YOUR SURGERY PATIENT SIGNATURE_________________________________  NURSE SIGNATURE__________________________________  ________________________________________________________________________   Alison [name]  An incentive spirometer is a tool that can help keep your lungs clear and active. This tool measures how well you are filling your lungs  with each breath. Taking long deep breaths may help reverse or decrease the chance of developing breathing (pulmonary) problems (especially infection) following:  A long period of time when you are unable to move or be active. BEFORE THE PROCEDURE   If the spirometer includes an indicator to show your best effort, your nurse or respiratory therapist will set it to a desired goal.  If possible, sit up straight or lean slightly forward. Try not to slouch.  Hold the incentive spirometer in an upright position. INSTRUCTIONS FOR USE   Sit on the edge of your bed if possible, or sit up as far as you can in bed or on a chair.  Hold the incentive spirometer in an upright position.  Breathe out normally.  Place the mouthpiece in your mouth and seal your lips tightly around it.  Breathe in slowly and as deeply as possible, raising the piston or the ball toward the top of the column.  Hold your breath for 3-5 seconds or for as long as possible. Allow the piston or ball to fall to the bottom of the column.  Remove the mouthpiece from your mouth and breathe out normally.  Rest for a few seconds and repeat Steps 1 through 7 at least 10 times every 1-2 hours when you are awake. Take your time and take a few normal breaths between deep breaths.  The spirometer may include an indicator to show your best effort. Use the indicator as a goal to work toward during each repetition.  After each set of 10 deep breaths, practice coughing to be sure your lungs are clear. If you have an incision (the cut made at the time of surgery),  support your incision when coughing by placing a pillow or rolled up towels firmly against it. Once you are able to get out of bed, walk around indoors and cough well. You may stop using the incentive spirometer when instructed by your caregiver.  RISKS AND COMPLICATIONS  Take your time so you do not get dizzy or light-headed.  If you are in pain, you may need to take or ask  for pain medication before doing incentive spirometry. It is harder to take a deep breath if you are having pain. AFTER USE  Rest and breathe slowly and easily.  It can be helpful to keep track of a log of your progress. Your caregiver can provide you with a simple table to help with this. If you are using the spirometer at home, follow these instructions: [place] IF:   You are having difficultly using the spirometer.  You have trouble using the spirometer as often as instructed.  Your pain medication is not giving enough relief while using the spirometer.  You develop fever of 100.5 F (38.1 C) or higher. SEEK IMMEDIATE MEDICAL CARE IF:   You cough up bloody sputum that had not been present before.  You develop fever of 102 F (38.9 C) or greater.  You develop worsening pain at or near the incision site. MAKE SURE YOU:   Understand these instructions.  Will watch your condition.  Will get help right away if you are not doing well or get worse. Document Released: 05/11/2006 Document Revised: 03/23/2011 Document Reviewed: 07/12/2006 Exit[employer] Patient Information 2014 Exit[employer], [place].   ________________________________________________________________________  WHAT IS A BLOOD TRANSFUSION? Blood Transfusion Information  A transfusion is the replacement of blood or some of its parts. Blood is made up of multiple cells which provide different functions.  Red blood cells carry oxygen and are used for blood loss replacement.  White blood cells fight against infection.  Platelets control bleeding.  Plasma helps clot blood.  Other blood products are available for specialized needs, such as hemophilia or other clotting disorders. BEFORE THE TRANSFUSION  Who gives blood for transfusions?   Healthy volunteers who are fully evaluated to make sure their blood is safe. This is blood bank blood. Transfusion therapy is the safest it has ever been in the practice of  medicine. Before blood is taken from a donor, a complete history is taken to make sure that person has no history of diseases nor engages in risky social behavior (examples are intravenous drug use or sexual activity with multiple partners). The donor's travel history is screened to minimize risk of transmitting infections, such as malaria. The donated blood is tested for signs of infectious diseases, such as HIV and hepatitis. The blood is then tested to be sure it is compatible with you in order to minimize the chance of a transfusion reaction. If you or a relative donates blood, this is often done in anticipation of surgery and is not appropriate for emergency situations. It takes many days to process the donated blood. RISKS AND COMPLICATIONS Although transfusion therapy is very safe and saves many lives, the main dangers of transfusion include:   Getting an infectious disease.  Developing a transfusion reaction. This is an allergic reaction to something in the blood you were given. Every precaution is taken to prevent this. The decision to have a blood transfusion has been considered carefully by your caregiver before blood is given. Blood is not given unless the benefits outweigh the risks. AFTER THE TRANSFUSION  Right after receiving a blood transfusion, you will usually feel much better and more energetic. This is especially true if your red blood cells have gotten low (anemic). The transfusion raises the level of the red blood cells which carry oxygen, and this usually causes an energy increase.  The nurse administering the transfusion will monitor you carefully for complications. HOME CARE INSTRUCTIONS  No special instructions are needed after a transfusion. You may find your energy is better. Speak with your caregiver about any limitations on activity for underlying diseases you may have. SEEK MEDICAL CARE IF:   Your condition is not improving after your transfusion.  You develop  redness or irritation at the intravenous (IV) site. SEEK IMMEDIATE MEDICAL CARE IF:  Any of the following symptoms occur over the next 12 hours:  Shaking chills.  You have a temperature by mouth above 102 F (38.9 C), not controlled by medicine.  Chest, back, or muscle pain.  People around you feel you are not acting correctly or are confused.  Shortness of breath or difficulty breathing.  Dizziness and fainting.  You get a rash or develop hives.  You have a decrease in urine output.  Your urine turns a dark color or changes to pink, red, or brown. Any of the following symptoms occur over the next 10 days:  You have a temperature by mouth above 102 F (38.9 C), not controlled by medicine.  Shortness of breath.  Weakness after normal activity.  The white part of the eye turns yellow (jaundice).  You have a decrease in the amount of urine or are urinating less often.  Your urine turns a dark color or changes to pink, red, or brown. Document Released: 12/27/1999 Document Revised: 03/23/2011 Document Reviewed: 08/15/2007 [facility] Patient Information 2014 [place], [place].  _______________________________________________________________________

## 2014-04-11 ENCOUNTER — Encounter (HOSPITAL_COMMUNITY): Payer: Self-pay

## 2014-04-11 ENCOUNTER — Encounter (HOSPITAL_COMMUNITY)
Admission: RE | Admit: 2014-04-11 | Discharge: 2014-04-11 | Disposition: A | Discharge status: Home / Self Care | Payer: Medicare Other | Source: Ambulatory Visit | Attending: Orthopedic Surgery | Admitting: Orthopedic Surgery

## 2014-04-11 ENCOUNTER — Other Ambulatory Visit: Payer: Self-pay

## 2014-04-11 ENCOUNTER — Ambulatory Visit (HOSPITAL_COMMUNITY)
Admission: RE | Admit: 2014-04-11 | Discharge: 2014-04-11 | Disposition: A | Discharge status: Home / Self Care | Payer: Medicare Other | Source: Ambulatory Visit | Attending: Surgical | Admitting: Surgical

## 2014-04-11 DIAGNOSIS — Z01818 Encounter for other preprocedural examination: Secondary | ICD-10-CM

## 2014-04-11 DIAGNOSIS — I10 Essential (primary) hypertension: Secondary | ICD-10-CM | POA: Diagnosis not present

## 2014-04-11 DIAGNOSIS — Z01811 Encounter for preprocedural respiratory examination: Secondary | ICD-10-CM | POA: Diagnosis not present

## 2014-04-11 DIAGNOSIS — Z720 Tobacco use: Secondary | ICD-10-CM | POA: Diagnosis not present

## 2014-04-11 DIAGNOSIS — I700 Atherosclerosis of aorta: Secondary | ICD-10-CM | POA: Diagnosis not present

## 2014-04-11 LAB — CBC WITH DIFFERENTIAL/PLATELET
Basophils Absolute: 0 10*3/uL (ref 0.0–0.1)
Basophils Relative: 0 % (ref 0–1)
Eosinophils Absolute: 0.1 10*3/uL (ref 0.0–0.7)
Eosinophils Relative: 1 % (ref 0–5)
HCT: 39.2 % (ref 36.0–46.0)
Hemoglobin: 12.9 g/dL (ref 12.0–15.0)
Lymphocytes Relative: 21 % (ref 12–46)
Lymphs Abs: 2.2 10*3/uL (ref 0.7–4.0)
MCH: 29.4 pg (ref 26.0–34.0)
MCHC: 32.9 g/dL (ref 30.0–36.0)
MCV: 89.3 fL (ref 78.0–100.0)
Monocytes Absolute: 0.8 10*3/uL (ref 0.1–1.0)
Monocytes Relative: 8 % (ref 3–12)
Neutro Abs: 7.3 10*3/uL (ref 1.7–7.7)
Neutrophils Relative %: 70 % (ref 43–77)
Platelets: 301 10*3/uL (ref 150–400)
RBC: 4.39 MIL/uL (ref 3.87–5.11)
RDW: 13.3 % (ref 11.5–15.5)
WBC: 10.5 10*3/uL (ref 4.0–10.5)

## 2014-04-11 LAB — COMPREHENSIVE METABOLIC PANEL
ALT: 20 U/L (ref 0–35)
AST: 33 U/L (ref 0–37)
Albumin: 4.3 g/dL (ref 3.5–5.2)
Alkaline Phosphatase: 59 U/L (ref 39–117)
Anion gap: 9 (ref 5–15)
BUN: 13 mg/dL (ref 6–23)
CO2: 27 mmol/L (ref 19–32)
Calcium: 9.5 mg/dL (ref 8.4–10.5)
Chloride: 102 mmol/L (ref 96–112)
Creatinine, Ser: 0.73 mg/dL (ref 0.50–1.10)
GFR calc Af Amer: >90 mL/min (ref >90)
GFR calc non Af Amer: 85 mL/min — ABNORMAL LOW (ref >90)
Glucose, Bld: 71 mg/dL (ref 70–99)
Potassium: 4.4 mmol/L (ref 3.5–5.1)
Sodium: 138 mmol/L (ref 135–145)
Total Bilirubin: 1 mg/dL (ref 0.3–1.2)
Total Protein: 7.9 g/dL (ref 6.0–8.3)

## 2014-04-11 LAB — URINALYSIS, ROUTINE W REFLEX MICROSCOPIC
Bilirubin Urine: NEGATIVE
Glucose, UA: NEGATIVE mg/dL
Ketones, ur: NEGATIVE mg/dL
Leukocytes, UA: NEGATIVE
Nitrite: NEGATIVE
Protein, ur: NEGATIVE mg/dL
Specific Gravity, Urine: 1.007 (ref 1.005–1.030)
Urobilinogen, UA: 0.2 mg/dL (ref 0.0–1.0)
pH: 5.5 (ref 5.0–8.0)

## 2014-04-11 LAB — PROTIME-INR
INR: 0.98 (ref 0.00–1.49)
Prothrombin Time: 13.1 seconds (ref 11.6–15.2)

## 2014-04-11 LAB — URINE MICROSCOPIC-ADD ON

## 2014-04-11 LAB — SURGICAL PCR SCREEN
MRSA, PCR: NEGATIVE
Staphylococcus aureus: NEGATIVE

## 2014-04-11 LAB — APTT: aPTT: 34 seconds (ref 24–37)

## 2014-04-11 NOTE — Progress Notes (Signed)
03/22/2014-Last office note and pre-operative clearance from Dr. Hollace Kinnier on chart.

## 2014-04-13 ENCOUNTER — Ambulatory Visit: Payer: Medicare Other | Admitting: Internal Medicine

## 2014-04-15 ENCOUNTER — Other Ambulatory Visit: Payer: Self-pay | Admitting: Internal Medicine

## 2014-04-17 NOTE — H&P (Signed)
TOTAL KNEE ADMISSION H&P  Patient is being admitted for left total knee arthroplasty.  Subjective:  Chief Complaint:left knee pain.  HPI: Alison [name], 69 y.o. female, has a history of pain and functional disability in the left knee due to arthritis and has failed non-surgical conservative treatments for greater than 12 weeks to includeNSAID's and/or analgesics, corticosteriod injections, use of assistive devices and activity modification.  Onset of symptoms was gradual, starting 5 years ago with gradually worsening course since that time. The patient noted no past surgery on the left knee(s).  Patient currently rates pain in the left knee(s) at 7 out of 10 with activity. Patient has night pain, worsening of pain with activity and weight bearing, pain that interferes with activities of daily living, pain with passive range of motion, crepitus and joint swelling.  Patient has evidence of periarticular osteophytes and joint space narrowing by imaging studies.  There is no active infection.  Patient Active Problem List   Diagnosis Date Noted  . History of lumbar laminectomy for spinal cord decompression 05/04/2013  . Synovial cyst of lumbar spine 03/31/2013  . Spinal stenosis, lumbar region, with neurogenic claudication 03/30/2013  . Synovial cyst of lumbar facet joint 03/30/2013  . Type II diabetes mellitus 02/26/2013  . Type II or unspecified type diabetes mellitus without mention of complication, uncontrolled 02/08/2013  . Acute blood loss anemia 02/02/2013  . Osteoarthritis of right knee 02/01/2013  . History of total knee replacement 02/01/2013  . Tobacco abuse 10/03/2012  . Hyperglycemia 10/03/2012  . Hyperlipidemia LDL goal < 100   . Unspecified vitamin D deficiency   . Obesity, unspecified   . Tobacco use disorder   . Pernicious anemia   . Unspecified glaucoma(365.9)   . Benign essential hypertension   . Osteoarthrosis, unspecified whether generalized or localized, lower leg   .  Osteopenia    Past Medical History  Diagnosis Date  . Unspecified vitamin D deficiency   . Obesity, unspecified   . Goiter, specified as simple   . Pernicious anemia   . Hyperlipidemia LDL goal < 100   . Unspecified glaucoma   . Benign essential hypertension   . Diverticulitis of colon (without mention of hemorrhage)   . Osteoarthrosis, unspecified whether generalized or localized, lower leg   . Osteopenia   . Numbness     TOES RT FOOT and toes left foot  . Diabetes mellitus without complication   . Other specified disease of nail     FUNGUS OF FINGERNAILS    Past Surgical History  Procedure Laterality Date  . Total knee arthroplasty Right 02/01/2013    Procedure: RIGHT TOTAL KNEE ARTHROPLASTY;  Surgeon: Tobi Bastos, MD;  Location: [facility]S;  Service: Orthopedics;  Laterality: Right;  . Colectomy  2009  . 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 Back surgery    . Joint replacement      right     Current outpatient prescriptions:  .  aspirin 81 MG tablet, Take 81 mg by mouth daily., Disp: , Rfl:  .  carboxymethylcellulose (REFRESH PLUS) 0.5 % SOLN, Place 1 drop into both eyes 3 (three) times daily as needed (dry eyes). , Disp: , Rfl:  .  celecoxib (CELEBREX) 200 MG capsule, TAKE 1 CAPSULE BY MOUTH ONCE DAILY FOR KNEE PAIN,  Disp: 90 capsule, Rfl: 0 .  ferrous sulfate 325 (65 FE) MG tablet, Take 1 tablet (325 mg total) by mouth 2 (two) times daily with a meal. (Patient taking differently: Take 325 mg by mouth daily. ), Disp: 180 tablet, Rfl: 0 .  HYDROcodone-acetaminophen (NORCO/VICODIN) 5-325 MG per tablet, Take 1 tablet by mouth every 6 (six) hours as needed for moderate pain. , Disp: , Rfl:  .  latanoprost (XALATAN) 0.005 % ophthalmic solution, INSTILL 1 DROP IN BOTH EYES  EVERY DAY AT BEDTIME, Disp: 2.5 mL, Rfl: 1 .  lisinopril (PRINIVIL,ZESTRIL) 5 MG tablet, Take 1 tablet (5 mg total) by mouth daily., Disp: 90 tablet, Rfl: 0 .  metFORMIN (GLUCOPHAGE) 500 MG tablet, TAKE 1/2 TABLET BY MOUTH EVERY EVENING AT SUPPER (Patient taking differently: Take 250 mg by mouth every evening. ), Disp: 90 tablet, Rfl: 0 .  Multiple Vitamin (MULTIVITAMIN WITH MINERALS) TABS tablet, Take 1 tablet by mouth daily., Disp: 90 tablet, Rfl: 0 .  omega-3 acid ethyl esters (LOVAZA) 1 G capsule, Take 2 capsules (2 g total) by mouth daily. (Patient taking differently: Take 1 g by mouth daily. ), Disp: 180 capsule, Rfl: 0 .  pravastatin (PRAVACHOL) 80 MG tablet, TAKE 1 TABLET BY MOUTH EVERY MORNING, Disp: 90 tablet, Rfl: 1 .  Tdap (ADACEL) 05-13-13.5 LF-MCG/0.5 injection, Inject 0.5 mLs into the muscle once., Disp: 0.5 mL, Rfl: 0  Allergies  Allergen Reactions  . Aspirin Nausea Only    Can take 81 mg but can't take 325mg     History  Substance Use Topics  . Smoking status: Current Every Day Smoker -- 0.50 packs/day for 30 years  . Smokeless tobacco: Never Used  . Alcohol Use: No    Family History  Problem Relation Age of Onset  . Diabetes Sister      Review of Systems  Constitutional: Negative.   HENT: Negative.   Eyes: Negative.   Respiratory: Positive for shortness of breath. Negative for cough, hemoptysis, sputum production and wheezing.        SOB with exertion  Cardiovascular: Negative.   Gastrointestinal: Negative.   Genitourinary: Positive for frequency. Negative for dysuria, urgency, hematuria and flank pain.  Musculoskeletal: Positive for joint pain. Negative for myalgias, back pain, falls and neck pain.       Left knee pain  Skin: Negative.   Neurological: Negative.   Endo/Heme/Allergies: Negative.   Psychiatric/Behavioral: Negative.     Objective:  Physical Exam  Constitutional: She is oriented to person, place, and time. She appears well-developed. No  distress.  Obese  HENT:  Head: Normocephalic and atraumatic.  Right Ear: External ear normal.  Left Ear: External ear normal.  Nose: Nose normal.  Mouth/Throat: Oropharynx is clear and moist.  Eyes: Conjunctivae and EOM are normal.  Neck: Normal range of motion. Neck supple.  Cardiovascular: Normal rate, regular rhythm, normal heart sounds and intact distal pulses.   No murmur heard. Respiratory: Effort normal and breath sounds normal. No respiratory distress. She has no wheezes.  GI: Soft. Bowel sounds are normal. She exhibits no distension. There is no tenderness.  Musculoskeletal:       Right hip: Normal.       Left hip: Normal.       Right knee: Normal.       Left knee: She exhibits decreased range of motion and swelling. She exhibits no effusion and no erythema. Tenderness found. Medial joint line and lateral joint line tenderness noted.  Incision from right TKA  healed with no signs of infection  Neurological: She is alert and oriented to person, place, and time. She has normal strength and normal reflexes. No sensory deficit.  Skin: No rash noted. She is not diaphoretic. No erythema.  Psychiatric: She has a normal mood and affect. Her behavior is normal.   Vitals  Weight: 194 lb Height: 66in Body Surface Area: 1.97 m Body Mass Index: 31.31 kg/m  Pulse: 76 (Regular)  BP: 138/80 (Sitting, Left Arm, Standard)   Imaging Review Plain radiographs demonstrate severe degenerative joint disease of the left knee(s). The overall alignment ismild varus. The bone quality appears to be good for age and reported activity level.  Assessment/Plan:  End stage arthritis, left knee   The patient history, physical examination, clinical judgment of the provider and imaging studies are consistent with end stage degenerative joint disease of the left knee(s) and total knee arthroplasty is deemed medically necessary. The treatment options including medical management, injection therapy  arthroscopy and arthroplasty were discussed at length. The risks and benefits of total knee arthroplasty were presented and reviewed. The risks due to aseptic loosening, infection, stiffness, patella tracking problems, thromboembolic complications and other imponderables were discussed. The patient acknowledged the explanation, agreed to proceed with the plan and consent was signed. Patient is being admitted for inpatient treatment for surgery, pain control, PT, OT, prophylactic antibiotics, VTE prophylaxis, progressive ambulation and ADL's and discharge planning. The patient is planning to be discharged home with home health services    TXA IV PCP: Dr. Morton Peters Has equipment for home  [facility], [place]

## 2014-04-18 ENCOUNTER — Encounter (HOSPITAL_COMMUNITY): Payer: Self-pay | Admitting: *Deleted

## 2014-04-18 ENCOUNTER — Inpatient Hospital Stay (HOSPITAL_COMMUNITY): Payer: Medicare Other | Admitting: Anesthesiology

## 2014-04-18 ENCOUNTER — Inpatient Hospital Stay (HOSPITAL_COMMUNITY)
Admission: RE | Admit: 2014-04-18 | Discharge: 2014-04-20 | DRG: 470 | Disposition: A | Discharge status: Home Health | Payer: Medicare Other | Source: Ambulatory Visit | Attending: Orthopedic Surgery | Admitting: Orthopedic Surgery

## 2014-04-18 ENCOUNTER — Encounter (HOSPITAL_COMMUNITY)
Admission: RE | Disposition: A | Discharge status: Home Health | Payer: Self-pay | Source: Ambulatory Visit | Attending: Orthopedic Surgery

## 2014-04-18 DIAGNOSIS — I10 Essential (primary) hypertension: Secondary | ICD-10-CM | POA: Diagnosis not present

## 2014-04-18 DIAGNOSIS — M4806 Spinal stenosis, lumbar region: Secondary | ICD-10-CM | POA: Diagnosis present

## 2014-04-18 DIAGNOSIS — E119 Type 2 diabetes mellitus without complications: Secondary | ICD-10-CM | POA: Diagnosis not present

## 2014-04-18 DIAGNOSIS — Z96652 Presence of left artificial knee joint: Secondary | ICD-10-CM

## 2014-04-18 DIAGNOSIS — E785 Hyperlipidemia, unspecified: Secondary | ICD-10-CM | POA: Diagnosis present

## 2014-04-18 DIAGNOSIS — M1712 Unilateral primary osteoarthritis, left knee: Principal | ICD-10-CM | POA: Diagnosis present

## 2014-04-18 DIAGNOSIS — Z886 Allergy status to analgesic agent status: Secondary | ICD-10-CM

## 2014-04-18 DIAGNOSIS — Z96651 Presence of right artificial knee joint: Secondary | ICD-10-CM | POA: Diagnosis present

## 2014-04-18 DIAGNOSIS — D510 Vitamin B12 deficiency anemia due to intrinsic factor deficiency: Secondary | ICD-10-CM | POA: Diagnosis not present

## 2014-04-18 DIAGNOSIS — E049 Nontoxic goiter, unspecified: Secondary | ICD-10-CM | POA: Diagnosis present

## 2014-04-18 DIAGNOSIS — M179 Osteoarthritis of knee, unspecified: Secondary | ICD-10-CM | POA: Diagnosis not present

## 2014-04-18 DIAGNOSIS — Z7982 Long term (current) use of aspirin: Secondary | ICD-10-CM | POA: Diagnosis not present

## 2014-04-18 DIAGNOSIS — E669 Obesity, unspecified: Secondary | ICD-10-CM | POA: Diagnosis present

## 2014-04-18 DIAGNOSIS — Z96659 Presence of unspecified artificial knee joint: Secondary | ICD-10-CM

## 2014-04-18 DIAGNOSIS — M8580 Other specified disorders of bone density and structure, unspecified site: Secondary | ICD-10-CM | POA: Diagnosis present

## 2014-04-18 DIAGNOSIS — M25562 Pain in left knee: Secondary | ICD-10-CM | POA: Diagnosis not present

## 2014-04-18 DIAGNOSIS — F17210 Nicotine dependence, cigarettes, uncomplicated: Secondary | ICD-10-CM | POA: Diagnosis present

## 2014-04-18 DIAGNOSIS — H409 Unspecified glaucoma: Secondary | ICD-10-CM | POA: Diagnosis present

## 2014-04-18 HISTORY — PX: TOTAL KNEE ARTHROPLASTY: SHX125

## 2014-04-18 LAB — GLUCOSE, CAPILLARY
GLUCOSE-CAPILLARY: 113 mg/dL — AB (ref 70–99)
GLUCOSE-CAPILLARY: 135 mg/dL — AB (ref 70–99)
GLUCOSE-CAPILLARY: 138 mg/dL — AB (ref 70–99)
Glucose-Capillary: 167 mg/dL — ABNORMAL HIGH (ref 70–99)

## 2014-04-18 LAB — TYPE AND SCREEN
ABO/RH(D): B POS
Antibody Screen: NEGATIVE

## 2014-04-18 SURGERY — ARTHROPLASTY, KNEE, TOTAL
Anesthesia: General | Laterality: Left

## 2014-04-18 MED ORDER — CHLORHEXIDINE GLUCONATE 4 % EX LIQD: 60.0000 mL | Freq: Once | CUTANEOUS | Status: DC

## 2014-04-18 MED ORDER — LIDOCAINE HCL (CARDIAC) 20 MG/ML IV SOLN
INTRAVENOUS | Status: AC
  Filled 2014-04-18: qty 5

## 2014-04-18 MED ORDER — LACTATED RINGERS IV SOLN
INTRAVENOUS | Status: DC
  Administered 2014-04-18: 100 mL/h via INTRAVENOUS
  Administered 2014-04-19 (×3): via INTRAVENOUS

## 2014-04-18 MED ORDER — FENTANYL CITRATE 0.05 MG/ML IJ SOLN
INTRAMUSCULAR | Status: DC | PRN
  Administered 2014-04-18 (×2): 100 ug via INTRAVENOUS
  Administered 2014-04-18: 150 ug via INTRAVENOUS

## 2014-04-18 MED ORDER — HYDROMORPHONE HCL 1 MG/ML IJ SOLN
INTRAMUSCULAR | Status: DC | PRN
  Administered 2014-04-18: 1 mg via INTRAVENOUS
  Administered 2014-04-18 (×2): 0.5 mg via INTRAVENOUS

## 2014-04-18 MED ORDER — HYDROMORPHONE HCL 2 MG/ML IJ SOLN
INTRAMUSCULAR | Status: AC
  Filled 2014-04-18: qty 1

## 2014-04-18 MED ORDER — LACTATED RINGERS IV SOLN
INTRAVENOUS | Status: DC | PRN
  Administered 2014-04-18: 08:00:00 via INTRAVENOUS

## 2014-04-18 MED ORDER — ROCURONIUM BROMIDE 100 MG/10ML IV SOLN
INTRAVENOUS | Status: DC | PRN
  Administered 2014-04-18: 50 mg via INTRAVENOUS

## 2014-04-18 MED ORDER — MIDAZOLAM HCL 2 MG/2ML IJ SOLN
INTRAMUSCULAR | Status: AC
  Filled 2014-04-18: qty 2

## 2014-04-18 MED ORDER — THROMBIN 5000 UNITS EX SOLR
OROMUCOSAL | Status: DC | PRN
  Administered 2014-04-18: 10:00:00 via TOPICAL

## 2014-04-18 MED ORDER — ONDANSETRON HCL 4 MG/2ML IJ SOLN
4.0000 mg | Freq: Four times a day (QID) | INTRAMUSCULAR | Status: DC | PRN
  Administered 2014-04-19: 4 mg via INTRAVENOUS
  Filled 2014-04-18: qty 2

## 2014-04-18 MED ORDER — CEFAZOLIN SODIUM-DEXTROSE 2-3 GM-% IV SOLR
INTRAVENOUS | Status: AC
  Filled 2014-04-18: qty 50

## 2014-04-18 MED ORDER — ROCURONIUM BROMIDE 100 MG/10ML IV SOLN
INTRAVENOUS | Status: AC
  Filled 2014-04-18: qty 1

## 2014-04-18 MED ORDER — NEOSTIGMINE METHYLSULFATE 10 MG/10ML IV SOLN
INTRAVENOUS | Status: DC | PRN
  Administered 2014-04-18: 2 mg via INTRAVENOUS

## 2014-04-18 MED ORDER — METHOCARBAMOL 1000 MG/10ML IJ SOLN
500.0000 mg | Freq: Four times a day (QID) | INTRAVENOUS | Status: DC | PRN
  Administered 2014-04-18 (×2): 500 mg via INTRAVENOUS
  Filled 2014-04-18 (×3): qty 5

## 2014-04-18 MED ORDER — SODIUM CHLORIDE 0.9 % IR SOLN
Status: DC | PRN
  Administered 2014-04-18: 500 mL

## 2014-04-18 MED ORDER — PROMETHAZINE HCL 25 MG/ML IJ SOLN: 6.2500 mg | INTRAMUSCULAR | Status: DC | PRN

## 2014-04-18 MED ORDER — ACETAMINOPHEN 650 MG RE SUPP: 650.0000 mg | Freq: Four times a day (QID) | RECTAL | Status: DC | PRN

## 2014-04-18 MED ORDER — ONDANSETRON HCL 4 MG/2ML IJ SOLN
INTRAMUSCULAR | Status: AC
  Filled 2014-04-18: qty 2

## 2014-04-18 MED ORDER — EPHEDRINE SULFATE 50 MG/ML IJ SOLN
INTRAMUSCULAR | Status: AC
  Filled 2014-04-18: qty 1

## 2014-04-18 MED ORDER — SODIUM CHLORIDE 0.9 % IJ SOLN
INTRAMUSCULAR | Status: AC
  Filled 2014-04-18: qty 50

## 2014-04-18 MED ORDER — FERROUS SULFATE 325 (65 FE) MG PO TABS
325.0000 mg | ORAL_TABLET | Freq: Three times a day (TID) | ORAL | Status: DC
  Administered 2014-04-19 – 2014-04-20 (×4): 325 mg via ORAL
  Filled 2014-04-18 (×8): qty 1

## 2014-04-18 MED ORDER — METFORMIN HCL 500 MG PO TABS
250.0000 mg | ORAL_TABLET | Freq: Every evening | ORAL | Status: DC
  Administered 2014-04-19: 250 mg via ORAL
  Filled 2014-04-18 (×3): qty 1

## 2014-04-18 MED ORDER — METHOCARBAMOL 500 MG PO TABS
500.0000 mg | ORAL_TABLET | Freq: Four times a day (QID) | ORAL | Status: DC | PRN
  Administered 2014-04-18 – 2014-04-20 (×5): 500 mg via ORAL
  Filled 2014-04-18 (×5): qty 1

## 2014-04-18 MED ORDER — BUPIVACAINE HCL (PF) 0.25 % IJ SOLN
INTRAMUSCULAR | Status: AC
  Filled 2014-04-18: qty 30

## 2014-04-18 MED ORDER — ACETAMINOPHEN 10 MG/ML IV SOLN
1000.0000 mg | Freq: Once | INTRAVENOUS | Status: AC
  Administered 2014-04-18: 1000 mg via INTRAVENOUS
  Filled 2014-04-18: qty 100

## 2014-04-18 MED ORDER — POLYETHYLENE GLYCOL 3350 17 G PO PACK: 17.0000 g | PACK | Freq: Every day | ORAL | Status: DC | PRN

## 2014-04-18 MED ORDER — OXYCODONE-ACETAMINOPHEN 5-325 MG PO TABS
2.0000 | ORAL_TABLET | ORAL | Status: DC | PRN
  Administered 2014-04-18 – 2014-04-19 (×5): 2 via ORAL
  Filled 2014-04-18 (×6): qty 2

## 2014-04-18 MED ORDER — MENTHOL 3 MG MT LOZG: 1.0000 | LOZENGE | OROMUCOSAL | Status: DC | PRN

## 2014-04-18 MED ORDER — ONDANSETRON HCL 4 MG/2ML IJ SOLN
INTRAMUSCULAR | Status: DC | PRN
  Administered 2014-04-18: 4 mg via INTRAVENOUS

## 2014-04-18 MED ORDER — ALUM & MAG HYDROXIDE-SIMETH 200-200-20 MG/5ML PO SUSP
30.0000 mL | ORAL | Status: DC | PRN
  Administered 2014-04-18 – 2014-04-19 (×2): 30 mL via ORAL
  Filled 2014-04-18 (×2): qty 30

## 2014-04-18 MED ORDER — MEPERIDINE HCL 50 MG/ML IJ SOLN: 6.2500 mg | INTRAMUSCULAR | Status: DC | PRN

## 2014-04-18 MED ORDER — INSULIN ASPART 100 UNIT/ML ~~LOC~~ SOLN
0.0000 [IU] | Freq: Three times a day (TID) | SUBCUTANEOUS | Status: DC
  Administered 2014-04-18: 3 [IU] via SUBCUTANEOUS
  Administered 2014-04-19 – 2014-04-20 (×2): 2 [IU] via SUBCUTANEOUS

## 2014-04-18 MED ORDER — ACETAMINOPHEN 325 MG PO TABS
650.0000 mg | ORAL_TABLET | Freq: Four times a day (QID) | ORAL | Status: DC | PRN
Start: 2014-04-18 — End: 2014-04-20

## 2014-04-18 MED ORDER — PROPOFOL 10 MG/ML IV BOLUS
INTRAVENOUS | Status: DC | PRN
  Administered 2014-04-18: 150 mg via INTRAVENOUS
  Administered 2014-04-18: 50 mg via INTRAVENOUS

## 2014-04-18 MED ORDER — SODIUM CHLORIDE 0.9 % IJ SOLN
INTRAMUSCULAR | Status: DC | PRN
  Administered 2014-04-18: 50 mL via INTRAVENOUS

## 2014-04-18 MED ORDER — BUPIVACAINE HCL (PF) 0.25 % IJ SOLN
INTRAMUSCULAR | Status: DC | PRN
  Administered 2014-04-18: 20 mL

## 2014-04-18 MED ORDER — MIDAZOLAM HCL 5 MG/5ML IJ SOLN
INTRAMUSCULAR | Status: DC | PRN
  Administered 2014-04-18 (×2): 1 mg via INTRAVENOUS

## 2014-04-18 MED ORDER — DEXAMETHASONE SODIUM PHOSPHATE 10 MG/ML IJ SOLN
INTRAMUSCULAR | Status: AC
  Filled 2014-04-18: qty 1

## 2014-04-18 MED ORDER — ONDANSETRON HCL 4 MG PO TABS
4.0000 mg | ORAL_TABLET | Freq: Four times a day (QID) | ORAL | Status: DC | PRN
  Administered 2014-04-20: 4 mg via ORAL
  Filled 2014-04-18: qty 1

## 2014-04-18 MED ORDER — FENTANYL CITRATE 0.05 MG/ML IJ SOLN
25.0000 ug | INTRAMUSCULAR | Status: DC | PRN
  Administered 2014-04-18 (×2): 50 ug via INTRAVENOUS

## 2014-04-18 MED ORDER — SODIUM CHLORIDE 0.9 % IJ SOLN
INTRAMUSCULAR | Status: AC
  Filled 2014-04-18: qty 10

## 2014-04-18 MED ORDER — FENTANYL CITRATE 0.05 MG/ML IJ SOLN
INTRAMUSCULAR | Status: AC
  Filled 2014-04-18: qty 5

## 2014-04-18 MED ORDER — LIP MEDEX EX OINT
TOPICAL_OINTMENT | CUTANEOUS | Status: AC
  Filled 2014-04-18: qty 7

## 2014-04-18 MED ORDER — FENTANYL CITRATE 0.05 MG/ML IJ SOLN
INTRAMUSCULAR | Status: AC
  Filled 2014-04-18: qty 2

## 2014-04-18 MED ORDER — TETANUS-DIPHTH-ACELL PERTUSSIS 5-2-15.5 LF-MCG/0.5 IM SUSP: 0.5000 mL | Freq: Once | INTRAMUSCULAR | Status: DC

## 2014-04-18 MED ORDER — LIDOCAINE HCL (CARDIAC) 20 MG/ML IV SOLN
INTRAVENOUS | Status: DC | PRN
  Administered 2014-04-18: 50 mg via INTRAVENOUS
  Administered 2014-04-18: 80 mg via INTRAVENOUS

## 2014-04-18 MED ORDER — LACTATED RINGERS IV SOLN: INTRAVENOUS | Status: DC

## 2014-04-18 MED ORDER — GLYCOPYRROLATE 0.2 MG/ML IJ SOLN
INTRAMUSCULAR | Status: DC | PRN
  Administered 2014-04-18: 0.3 mg via INTRAVENOUS

## 2014-04-18 MED ORDER — THROMBIN 5000 UNITS EX SOLR
CUTANEOUS | Status: AC
  Filled 2014-04-18: qty 5000

## 2014-04-18 MED ORDER — HYDROMORPHONE HCL 1 MG/ML IJ SOLN
1.0000 mg | INTRAMUSCULAR | Status: DC | PRN
  Administered 2014-04-18 – 2014-04-19 (×7): 1 mg via INTRAVENOUS
  Filled 2014-04-18 (×6): qty 1

## 2014-04-18 MED ORDER — PRAVASTATIN SODIUM 80 MG PO TABS
80.0000 mg | ORAL_TABLET | Freq: Every morning | ORAL | Status: DC
  Administered 2014-04-18 – 2014-04-20 (×3): 80 mg via ORAL
  Filled 2014-04-18 (×3): qty 1

## 2014-04-18 MED ORDER — DEXAMETHASONE SODIUM PHOSPHATE 10 MG/ML IJ SOLN
INTRAMUSCULAR | Status: DC | PRN
  Administered 2014-04-18: 10 mg via INTRAVENOUS

## 2014-04-18 MED ORDER — LISINOPRIL 5 MG PO TABS
5.0000 mg | ORAL_TABLET | Freq: Every day | ORAL | Status: DC
  Administered 2014-04-19 – 2014-04-20 (×2): 5 mg via ORAL
  Filled 2014-04-18 (×3): qty 1

## 2014-04-18 MED ORDER — PROPOFOL 10 MG/ML IV BOLUS
INTRAVENOUS | Status: AC
  Filled 2014-04-18: qty 20

## 2014-04-18 MED ORDER — CEFAZOLIN SODIUM 1-5 GM-% IV SOLN
1.0000 g | Freq: Four times a day (QID) | INTRAVENOUS | Status: AC
  Administered 2014-04-18 (×2): 1 g via INTRAVENOUS
  Filled 2014-04-18 (×2): qty 50

## 2014-04-18 MED ORDER — PHENOL 1.4 % MT LIQD: 1.0000 | OROMUCOSAL | Status: DC | PRN

## 2014-04-18 MED ORDER — FLEET ENEMA 7-19 GM/118ML RE ENEM: 1.0000 | ENEMA | Freq: Once | RECTAL | Status: AC | PRN

## 2014-04-18 MED ORDER — LABETALOL HCL 5 MG/ML IV SOLN
INTRAVENOUS | Status: DC | PRN
  Administered 2014-04-18: 5 mg via INTRAVENOUS

## 2014-04-18 MED ORDER — RIVAROXABAN 10 MG PO TABS
10.0000 mg | ORAL_TABLET | Freq: Every day | ORAL | Status: DC
  Administered 2014-04-19 – 2014-04-20 (×2): 10 mg via ORAL
  Filled 2014-04-18 (×3): qty 1

## 2014-04-18 MED ORDER — BISACODYL 5 MG PO TBEC: 5.0000 mg | DELAYED_RELEASE_TABLET | Freq: Every day | ORAL | Status: DC | PRN

## 2014-04-18 MED ORDER — HYDROCODONE-ACETAMINOPHEN 5-325 MG PO TABS: 1.0000 | ORAL_TABLET | Freq: Four times a day (QID) | ORAL | Status: DC | PRN

## 2014-04-18 MED ORDER — BUPIVACAINE LIPOSOME 1.3 % IJ SUSP
20.0000 mL | Freq: Once | INTRAMUSCULAR | Status: AC
  Administered 2014-04-18: 20 mL
  Filled 2014-04-18: qty 20

## 2014-04-18 MED ORDER — TRANEXAMIC ACID 100 MG/ML IV SOLN
1000.0000 mg | INTRAVENOUS | Status: AC
  Administered 2014-04-18: 1000 mg via INTRAVENOUS
  Filled 2014-04-18: qty 10

## 2014-04-18 MED ORDER — CEFAZOLIN SODIUM-DEXTROSE 2-3 GM-% IV SOLR
2.0000 g | INTRAVENOUS | Status: AC
  Administered 2014-04-18: 2 g via INTRAVENOUS

## 2014-04-18 MED ORDER — SODIUM CHLORIDE 0.9 % IR SOLN
Status: DC | PRN
  Administered 2014-04-18: 1000 mL

## 2014-04-18 SURGICAL SUPPLY — 74 items
BAG DECANTER FOR FLEXI CONT (MISCELLANEOUS) ×3 IMPLANT
BAG ZIPLOCK 12X15 (MISCELLANEOUS) IMPLANT
BANDAGE ELASTIC 4 VELCRO ST LF (GAUZE/BANDAGES/DRESSINGS) ×3 IMPLANT
BANDAGE ELASTIC 6 VELCRO ST LF (GAUZE/BANDAGES/DRESSINGS) ×3 IMPLANT
BANDAGE ESMARK 6X9 LF (GAUZE/BANDAGES/DRESSINGS) ×1 IMPLANT
BLADE SAG 18X100X1.27 (BLADE) ×3 IMPLANT
BLADE SAW SGTL 11.0X1.19X90.0M (BLADE) ×3 IMPLANT
BNDG ESMARK 6X9 LF (GAUZE/BANDAGES/DRESSINGS) ×3
BONE CEMENT GENTAMICIN (Cement) ×6 IMPLANT
CAP KNEE TOTAL 3 SIGMA ×3 IMPLANT
CEMENT BONE GENTAMICIN 40 (Cement) ×2 IMPLANT
CUFF TOURN SGL QUICK 34 (TOURNIQUET CUFF) ×2
CUFF TRNQT CYL 34X4X40X1 (TOURNIQUET CUFF) ×1 IMPLANT
DERMABOND ADVANCED (GAUZE/BANDAGES/DRESSINGS) ×2
DERMABOND ADVANCED .7 DNX12 (GAUZE/BANDAGES/DRESSINGS) ×1 IMPLANT
DRAPE EXTREMITY T 121X128X90 (DRAPE) ×3 IMPLANT
DRAPE INCISE IOBAN 66X45 STRL (DRAPES) IMPLANT
DRAPE POUCH INSTRU U-SHP 10X18 (DRAPES) ×3 IMPLANT
DRAPE U-SHAPE 47X51 STRL (DRAPES) ×3 IMPLANT
DRSG AQUACEL AG ADV 3.5X10 (GAUZE/BANDAGES/DRESSINGS) ×3 IMPLANT
DRSG OPSITE POSTOP 3X4 (GAUZE/BANDAGES/DRESSINGS) ×3 IMPLANT
DRSG PAD ABDOMINAL 8X10 ST (GAUZE/BANDAGES/DRESSINGS) IMPLANT
DRSG TEGADERM 4X4.75 (GAUZE/BANDAGES/DRESSINGS) ×3 IMPLANT
DURAPREP 26ML APPLICATOR (WOUND CARE) ×3 IMPLANT
ELECT REM PT RETURN 9FT ADLT (ELECTROSURGICAL) ×3
ELECTRODE REM PT RTRN 9FT ADLT (ELECTROSURGICAL) ×1 IMPLANT
EVACUATOR 1/8 PVC DRAIN (DRAIN) ×3 IMPLANT
FACESHIELD WRAPAROUND (MASK) ×18 IMPLANT
GAUZE SPONGE 2X2 8PLY STRL LF (GAUZE/BANDAGES/DRESSINGS) ×1 IMPLANT
GAUZE XEROFORM 2X2 STRL (GAUZE/BANDAGES/DRESSINGS) ×3 IMPLANT
GLOVE BIOGEL PI IND STRL 6.5 (GLOVE) ×1 IMPLANT
GLOVE BIOGEL PI IND STRL 8 (GLOVE) ×1 IMPLANT
GLOVE BIOGEL PI INDICATOR 6.5 (GLOVE) ×2
GLOVE BIOGEL PI INDICATOR 8 (GLOVE) ×2
GLOVE ECLIPSE 8.0 STRL XLNG CF (GLOVE) ×6 IMPLANT
GLOVE SURG SS PI 6.5 STRL IVOR (GLOVE) ×3 IMPLANT
GOWN STRL REUS W/TWL LRG LVL3 (GOWN DISPOSABLE) ×3 IMPLANT
GOWN STRL REUS W/TWL XL LVL3 (GOWN DISPOSABLE) ×3 IMPLANT
HANDPIECE INTERPULSE COAX TIP (DISPOSABLE) ×2
IMMOBILIZER KNEE 20 (SOFTGOODS) ×3 IMPLANT
KIT BASIN OR (CUSTOM PROCEDURE TRAY) ×3 IMPLANT
LIQUID BAND (GAUZE/BANDAGES/DRESSINGS) ×3 IMPLANT
MANIFOLD NEPTUNE II (INSTRUMENTS) ×3 IMPLANT
NDL SAFETY ECLIPSE 18X1.5 (NEEDLE) IMPLANT
NEEDLE HYPO 18GX1.5 SHARP (NEEDLE)
NEEDLE HYPO 22GX1.5 SAFETY (NEEDLE) ×6 IMPLANT
NS IRRIG 1000ML POUR BTL (IV SOLUTION) IMPLANT
PACK TOTAL JOINT (CUSTOM PROCEDURE TRAY) ×3 IMPLANT
PADDING CAST COTTON 6X4 STRL (CAST SUPPLIES) IMPLANT
PEN SKIN MARKING BROAD (MISCELLANEOUS) ×3 IMPLANT
POSITIONER SURGICAL ARM (MISCELLANEOUS) ×3 IMPLANT
SET HNDPC FAN SPRY TIP SCT (DISPOSABLE) ×1 IMPLANT
SET PAD KNEE POSITIONER (MISCELLANEOUS) ×3 IMPLANT
SPONGE GAUZE 2X2 STER 10/PKG (GAUZE/BANDAGES/DRESSINGS) ×2
SPONGE LAP 18X18 X RAY DECT (DISPOSABLE) ×3 IMPLANT
SPONGE SURGIFOAM ABS GEL 100 (HEMOSTASIS) ×3 IMPLANT
STAPLER VISISTAT 35W (STAPLE) IMPLANT
SUCTION FRAZIER 12FR DISP (SUCTIONS) ×3 IMPLANT
SUT BONE WAX W31G (SUTURE) ×3 IMPLANT
SUT MNCRL AB 4-0 PS2 18 (SUTURE) ×3 IMPLANT
SUT VIC AB 1 CT1 27 (SUTURE) ×4
SUT VIC AB 1 CT1 27XBRD ANTBC (SUTURE) ×2 IMPLANT
SUT VIC AB 2-0 CT1 27 (SUTURE) ×6
SUT VIC AB 2-0 CT1 TAPERPNT 27 (SUTURE) ×3 IMPLANT
SUT VLOC 180 0 24IN GS25 (SUTURE) ×3 IMPLANT
SYR 20CC LL (SYRINGE) ×6 IMPLANT
SYR 50ML LL SCALE MARK (SYRINGE) ×3 IMPLANT
TOWEL OR 17X26 10 PK STRL BLUE (TOWEL DISPOSABLE) ×3 IMPLANT
TOWEL OR NON WOVEN STRL DISP B (DISPOSABLE) IMPLANT
TOWER CARTRIDGE SMART MIX (DISPOSABLE) ×3 IMPLANT
TRAY FOLEY CATH 14FRSI W/METER (CATHETERS) ×3 IMPLANT
WATER STERILE IRR 1500ML POUR (IV SOLUTION) ×3 IMPLANT
WRAP KNEE MAXI GEL POST OP (GAUZE/BANDAGES/DRESSINGS) ×3 IMPLANT
YANKAUER SUCT BULB TIP 10FT TU (MISCELLANEOUS) ×3 IMPLANT

## 2014-04-18 NOTE — Anesthesia Postprocedure Evaluation (Signed)
  Anesthesia Post-op Note  Patient: Alison [name]  Procedure(s) Performed: Procedure(s) (LRB): LEFT TOTAL KNEE ARTHROPLASTY (Left)  Patient Location: PACU  Anesthesia Type: General  Level of Consciousness: awake and alert   Airway and Oxygen Therapy: Patient Spontanous Breathing  Post-op Pain: mild  Post-op Assessment: Post-op Vital signs reviewed, Patient's Cardiovascular Status Stable, Respiratory Function Stable, Patent Airway and No signs of Nausea or vomiting  Last Vitals:  Filed Vitals:   04/18/14 1355  BP: 172/80  Pulse: 87  Temp: 36.7 C  Resp: 16    Post-op Vital Signs: stable   Complications: No apparent anesthesia complications

## 2014-04-18 NOTE — Anesthesia Preprocedure Evaluation (Addendum)
Anesthesia Evaluation  Patient identified by MRN, date of birth, ID band Patient awake    Reviewed: Allergy & Precautions, H&P , NPO status , Patient's Chart, lab work & pertinent test results, reviewed documented beta blocker date and time   Airway Mallampati: II  TM Distance: >3 FB Neck ROM: full    Dental  (+) Edentulous Upper, Dental Advisory Given   Pulmonary neg pulmonary ROS, Current Smoker,  breath sounds clear to auscultation  Pulmonary exam normal       Cardiovascular hypertension, Pt. on medications Rhythm:regular Rate:Normal     Neuro/Psych negative neurological ROS  negative psych ROS   GI/Hepatic negative GI ROS, Neg liver ROS,   Endo/Other  diabetes, Well Controlled, Type 2, Oral Hypoglycemic Agents  Renal/GU negative Renal ROS  negative genitourinary   Musculoskeletal   Abdominal   Peds  Hematology negative hematology ROS (+) Pernicious anemia   Anesthesia Other Findings   Reproductive/Obstetrics negative OB ROS                            Anesthesia Physical  Anesthesia Plan  ASA: III  Anesthesia Plan: General   Post-op Pain Management:    Induction: Intravenous  Airway Management Planned: Oral ETT  Additional Equipment:   Intra-op Plan:   Post-operative Plan: Extubation in OR  Informed Consent: I have reviewed the patients History and Physical, chart, labs and discussed the procedure including the risks, benefits and alternatives for the proposed anesthesia with the patient or authorized representative who has indicated his/her understanding and acceptance.   Dental Advisory Given  Plan Discussed with: CRNA and Surgeon  Anesthesia Plan Comments:         Anesthesia Quick Evaluation

## 2014-04-18 NOTE — Brief Op Note (Signed)
04/18/2014  10:18 AM  PATIENT:  Alison [name]  69 y.o. female  PRE-OPERATIVE DIAGNOSIS:Primary   OSTEOARTHRITIS LEFT KNEE  POST-OPERATIVE DIAGNOSIS:Primary  OSTEOARTHRITIS LEFT KNEE  PROCEDURE:  Procedure(s): LEFT TOTAL KNEE ARTHROPLASTY (Left)  SURGEON:  Surgeon(s) and Role:    * Latanya Maudlin, MD - Primary  PHYSICIAN ASSISTANT:Amber Valparaiso PA   ASSISTANTS: Ardeen Jourdain PA  ANESTHESIA:   general  EBL:     BLOOD ADMINISTERED:none  DRAINS: (One) Hemovact drain(s) in the Left Knee with  Suction Open   LOCAL MEDICATIONS USED:  MARCAINE 20cc of 0.25% Plain and 20cc of Exparel mixed with 20cc of Normal Saline.    SPECIMEN:  No Specimen  DISPOSITION OF SPECIMEN:  N/A  COUNTS:  YES  TOURNIQUET:  * Missing tourniquet times found for documented tourniquets in log:  [id number] *  DICTATION: .Other Dictation: Dictation Number [id number]  PLAN OF CARE: Admit to inpatient   PATIENT DISPOSITION:  Stable in OR   Delay start of Pharmacological VTE agent (>24hrs) due to surgical blood loss or risk of bleeding: yes

## 2014-04-18 NOTE — Transfer of Care (Signed)
Immediate Anesthesia Transfer of Care Note  Patient: Alison [name]  Procedure(s) Performed: Procedure(s): LEFT TOTAL KNEE ARTHROPLASTY (Left)  Patient Location: PACU  Anesthesia Type:General  Level of Consciousness: awake, alert  and oriented  Airway & Oxygen Therapy: Patient Spontanous Breathing and Patient connected to face mask oxygen  Post-op Assessment: Report given to RN and Post -op Vital signs reviewed and stable  Post vital signs: Reviewed and stable  Last Vitals:  Filed Vitals:   04/18/14 0619  BP: 130/77  Pulse: 81  Temp: 36.4 C  Resp: 18    Complications: No apparent anesthesia complications

## 2014-04-18 NOTE — Progress Notes (Signed)
Utilization review completed.  

## 2014-04-18 NOTE — Interval H&P Note (Signed)
History and Physical Interval Note:  04/18/2014 8:21 AM  Alison [name]  has presented today for surgery, with the diagnosis of OA LEFT KNEE  The various methods of treatment have been discussed with the patient and family. After consideration of risks, benefits and other options for treatment, the patient has consented to  Procedure(s): LEFT TOTAL KNEE ARTHROPLASTY (Left) as a surgical intervention .  The patient's history has been reviewed, patient examined, no change in status, stable for surgery.  I have reviewed the patient's chart and labs.  Questions were answered to the patient's satisfaction.     Dhilan Brauer A

## 2014-04-18 NOTE — Anesthesia Procedure Notes (Signed)
Procedure Name: Intubation Date/Time: 04/18/2014 8:32 AM Performed by: Glory Buff Pre-anesthesia Checklist: Patient identified, Emergency Drugs available, Suction available and Patient being monitored Patient Re-evaluated:Patient Re-evaluated prior to inductionOxygen Delivery Method: Circle System Utilized Preoxygenation: Pre-oxygenation with 100% oxygen Intubation Type: IV induction Ventilation: Mask ventilation without difficulty Laryngoscope Size: Miller and 3 Grade View: Grade I Tube type: Oral Number of attempts: 1 Airway Equipment and Method: Stylet and Oral airway Placement Confirmation: ETT inserted through vocal cords under direct vision,  positive ETCO2 and breath sounds checked- equal and bilateral Secured at: 21 cm Tube secured with: Tape Dental Injury: Teeth and Oropharynx as per pre-operative assessment

## 2014-04-19 ENCOUNTER — Encounter (HOSPITAL_COMMUNITY): Payer: Self-pay | Admitting: Orthopedic Surgery

## 2014-04-19 LAB — CBC
HCT: 32.1 % — ABNORMAL LOW (ref 36.0–46.0)
Hemoglobin: 10.5 g/dL — ABNORMAL LOW (ref 12.0–15.0)
MCH: 29.1 pg (ref 26.0–34.0)
MCHC: 32.7 g/dL (ref 30.0–36.0)
MCV: 88.9 fL (ref 78.0–100.0)
PLATELETS: 291 10*3/uL (ref 150–400)
RBC: 3.61 MIL/uL — AB (ref 3.87–5.11)
RDW: 13.4 % (ref 11.5–15.5)
WBC: 16.7 10*3/uL — ABNORMAL HIGH (ref 4.0–10.5)

## 2014-04-19 LAB — BASIC METABOLIC PANEL
Anion gap: 9 (ref 5–15)
BUN: 12 mg/dL (ref 6–23)
CALCIUM: 9.1 mg/dL (ref 8.4–10.5)
CHLORIDE: 102 mmol/L (ref 96–112)
CO2: 27 mmol/L (ref 19–32)
Creatinine, Ser: 0.8 mg/dL (ref 0.50–1.10)
GFR calc non Af Amer: 74 mL/min — ABNORMAL LOW (ref >90)
GFR, EST AFRICAN AMERICAN: 85 mL/min — AB (ref >90)
Glucose, Bld: 137 mg/dL — ABNORMAL HIGH (ref 70–99)
Potassium: 4.2 mmol/L (ref 3.5–5.1)
SODIUM: 138 mmol/L (ref 135–145)

## 2014-04-19 LAB — GLUCOSE, CAPILLARY
Glucose-Capillary: 125 mg/dL — ABNORMAL HIGH (ref 70–99)
Glucose-Capillary: 115 mg/dL — ABNORMAL HIGH (ref 70–99)
Glucose-Capillary: 119 mg/dL — ABNORMAL HIGH (ref 70–99)
Glucose-Capillary: 172 mg/dL — ABNORMAL HIGH (ref 70–99)

## 2014-04-19 MED ORDER — DOCUSATE SODIUM 100 MG PO CAPS
100.0000 mg | ORAL_CAPSULE | Freq: Two times a day (BID) | ORAL | Status: DC
  Administered 2014-04-19 – 2014-04-20 (×3): 100 mg via ORAL

## 2014-04-19 MED ORDER — PROMETHAZINE HCL 25 MG PO TABS: 12.5000 mg | ORAL_TABLET | Freq: Four times a day (QID) | ORAL | Status: DC | PRN

## 2014-04-19 MED ORDER — OXYCODONE HCL 5 MG PO TABS
5.0000 mg | ORAL_TABLET | ORAL | Status: DC | PRN
  Administered 2014-04-19 – 2014-04-20 (×4): 15 mg via ORAL
  Administered 2014-04-20: 5 mg via ORAL
  Administered 2014-04-20 (×3): 15 mg via ORAL
  Filled 2014-04-19 (×7): qty 3

## 2014-04-19 NOTE — Progress Notes (Signed)
Pt is part of medicare bundle program. PN reviewed. PT recommends HHPT following hospital d/c. RNCM is assisting with d/c planning. CSW is available if plan changes and ST rehab is needed.  Werner Lean LCSW [phone]

## 2014-04-19 NOTE — Op Note (Signed)
Alison [name], Alison [name]              ACCOUNT NO.:  [id number]  MEDICAL RECORD NO.:  [id number]  LOCATION:  8338                         FACILITY:  [facility]  PHYSICIAN:  Kipp Brood. Elasha Tess, M.D.DATE OF BIRTH:  [birth year]  DATE OF PROCEDURE:  04/18/2014 DATE OF DISCHARGE:                              OPERATIVE REPORT   SURGEON:  Kipp Brood. 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  POSTOPERATIVE DIAGNOSIS:  Severe primary osteoarthritis with bone-on- bone of the left knee.  OPERATION:  Left total knee arthroplasty.  SIZES USED:  A size 4 narrow left femoral component posterior cruciate sacrificing type tibial tray was a size 3, the insert was a size 4, 12.5 mm thickness.  The patella was a size 38 with 3 pegs.  DESCRIPTION OF PROCEDURE:  Under general anesthesia, routine orthopedic prep and draping of left lower extremity was carried out.  I first did appropriate time-out, also marked the appropriate left leg in the holding area.  At this time, the leg was exsanguinated with Esmarch, tourniquet was elevated at 325 mmHg.  At this time, the left leg then was placed in the [facility] leg holder.  The knee was flexed and I did an anterior approach to the knee, and created 2 flaps, followed by a median and parapatellar incision.  I reflected the patella laterally.  I then did medial and lateral meniscectomies and excised the anterior-posterior cruciate ligaments.  At this time, initial drill hole was made in the intercondylar notch region.  The canal finder was inserted.  I thoroughly irrigated out the canal and then following that, I removed 11 mm thickness off the distal femur in the usual fashion.  We measured the femur and felt that the femur would be more of a narrow after we did the measurement.  We then did the anterior posterior chamfering cuts for a size 4 in the distal femur.   At this particular time, we then went and directed attention to the tibia, now we did remove all spurs from the femur and the tibia.  We measured the tibial tray to be a size 3.  At that time, initial drill hole was made in the tibial plateau.  The guidewire was inserted.  We then thoroughly irrigated out the canal and we removed approximately 6 mm thickness off the medial side of the tibia.  At this particular time, after appropriate tibia cut was made, we then inserted the lamina spreaders and removed the posterior spurs off the distal femur.  At that particular time, we then inserted our spacer blocks and felt that the most stable construct would be to utilize a 12.5 mm thickness insert.  We tested in flexion and extension. Following that, we then continued to prepare the tibial plateau.  We cut our appropriate keel cut out of the tibial plateau.  Drill holes were made in the medial tibial plateau because of the density of the bone and this was done for better cement purchase.  We then went on, did our notch cut out of the distal femur in the usual fashion.  We inserted the  trial components and at this time, we felt that the 12.5 mm thickness insert was the best.  We then did a resurfacing procedure on the patella.  Three drill holes were made in the patella for a size 38 patella.  The trial components were first removed.  We irrigated the knee out and all 3 components were cemented in simultaneously.  All loose pieces of cement were later removed.  We water picked out the knee, to make sure the loose piece of cement were removed.  Gentamicin was used in the cement.  After we removed all loose pieces of cement, the knee through motion again we had good stability, removed our trial insert and inserted our permanent rotating platform tray size 4, 12.5 mm thickness.  We reduced the knee, took the knee through motion, had good stability and good motion, good flexion and good extension.   At that time, I inserted Hemovac drain and closed the wound in layers in usual fashion.  Prior to doing the closure, we injected 20 mL of 0.25% Marcaine plain and then following that, 20 mL of Exparel mixed with 20 mL of saline.  Sterile dressings were applied.          ______________________________ Kipp Brood Gladstone Lighter, M.D.     RAG/MEDQ  D:  04/18/2014  T:  04/19/2014  Job:  [id number]

## 2014-04-19 NOTE — Progress Notes (Signed)
   Subjective: 1 Day Post-Op Procedure(s) (LRB): LEFT TOTAL KNEE ARTHROPLASTY (Left) Patient reports pain as moderate.   Patient seen in rounds with Dr. Gladstone Lighter. Patient is having issues with pain control. She did not get much rest last night. Says that therapy is difficult but she is doing her best. No SOB or chest pain.    Objective: Vital signs in last 24 hours: Temp:  [97.6 F (36.4 C)-99.3 F (37.4 C)] 99.1 F (37.3 C) (04/07 0939) Pulse Rate:  [78-87] 84 (04/07 0939) Resp:  [15-20] 20 (04/07 1200) BP: (133-184)/(61-88) 164/74 mmHg (04/07 1040) SpO2:  [95 %-100 %] 95 % (04/07 0939) Weight:  [88.905 kg (196 lb)] 88.905 kg (196 lb) (04/06 1254)  Intake/Output from previous day:  Intake/Output Summary (Last 24 hours) at 04/19/14 1247 Last data filed at 04/19/14 1219  Gross per 24 hour  Intake   1520 ml  Output   4686 ml  Net  -3166 ml    Intake/Output this shift: Total I/O In: 240 [P.O.:240] Out: 951 [Urine:850; Emesis/NG output:1; Drains:100]  Labs:  Recent Labs  04/19/14 0425  HGB 10.5*    Recent Labs  04/19/14 0425  WBC 16.7*  RBC 3.61*  HCT 32.1*  PLT 291    Recent Labs  04/19/14 0425  NA 138  K 4.2  CL 102  CO2 27  BUN 12  CREATININE 0.80  GLUCOSE 137*  CALCIUM 9.1    EXAM General - Patient is Alert and Oriented Extremity - Neurologically intact Intact pulses distally Dorsiflexion/Plantar flexion intact No cellulitis present Compartment soft Dressing - dressing C/D/I Motor Function - intact, moving foot and toes well on exam.  Hemovac pulled without difficulty. Drain site reinforced with gauze and paper tape  Past Medical History  Diagnosis Date  . Unspecified vitamin D deficiency   . Obesity, unspecified   . Goiter, specified as simple   . Pernicious anemia   . Hyperlipidemia LDL goal < 100   . Unspecified glaucoma   . Benign essential hypertension   . Diverticulitis of colon (without mention of hemorrhage)   .  Osteoarthrosis, unspecified whether generalized or localized, lower leg   . Osteopenia   . Numbness     TOES RT FOOT and toes left foot  . Diabetes mellitus without complication   . Other specified disease of nail     FUNGUS OF FINGERNAILS    Assessment/Plan: 1 Day Post-Op Procedure(s) (LRB): LEFT TOTAL KNEE ARTHROPLASTY (Left) Active Problems:   History of total knee arthroplasty  Estimated body mass index is 31.65 kg/(m^2) as calculated from the following:   Height as of this encounter: 5\' 6"  (1.676 m).   Weight as of this encounter: 88.905 kg (196 lb). Advance diet Up with therapy D/C IV fluids Plan for discharge tomorrow  DVT Prophylaxis - Xarelto Weight-Bearing as tolerated    Progressing with therapy. Will switch from Percocet to Oxy IR. Will add phenergan for nausea. Plan for DC home tomorrow with HHPT if she continues to progress.   Ardeen Jourdain, PA-C Orthopaedic Surgery 04/19/2014, 12:47 PM

## 2014-04-19 NOTE — Care Management Note (Signed)
    Page 1 of 1   04/19/2014     1:28:08 PM CARE MANAGEMENT NOTE 04/19/2014  Patient:  [facility]   Account Number:  [id number]  Date Initiated:  04/19/2014  Documentation initiated by:  [facility]  Subjective/Objective Assessment:   adm: LEFT TOTAL KNEE ARTHROPLASTY (Left)     Action/Plan:   discharge planning   Anticipated DC Date:  04/20/2014   Anticipated DC Plan:  [place]  CM consult      [facility] Choice  HOME HEALTH   Choice offered to / List presented to:  C-1 Patient        [place] arranged  [place] PT      [place].   Status of service:  Completed, signed off Medicare Important Message given?   (If response is "NO", the following Medicare IM given date fields will be blank) Date Medicare IM given:   Medicare IM given by:   Date Additional Medicare IM given:   Additional Medicare IM given by:    Discharge Disposition:  [place]  Per UR Regulation:    If discussed at Long Length of Stay Meetings, dates discussed:    Comments:  04/19/14 13:15 Cm met with pt in room to offer choice of home health agency.  Pt chooses [facility] to render HHPT.  Address and contact information verified with pt.  Referral called to [facility] rep, Kristen.  No DME is needed as pt has both rolling walker and 3n1 at home.  No other CM needs were communicated. Mariane Masters, BSN, CM [phone].

## 2014-04-19 NOTE — Evaluation (Signed)
Physical Therapy Evaluation Patient Details Name: Alison [name] MRN: [id number] DOB: [birth year] Today's Date: 04/19/2014   History of Present Illness  L TKR: s/p R TKR (1/15) and back surgery (3/15)  Clinical Impression  Pt s/p L TKR presents with decreased L LE strength/ROM and post op pain limiting functional mobility.  Pt should progress to dc home with family assist and HHPT follow up.    Follow Up Recommendations Home health PT    Equipment Recommendations  None recommended by PT    Recommendations for Other Services OT consult     Precautions / Restrictions Precautions Precautions: Knee;Fall Required Braces or Orthoses: Knee Immobilizer - Left Knee Immobilizer - Left: Discontinue once straight leg raise with < 10 degree lag Restrictions Weight Bearing Restrictions: No Other Position/Activity Restrictions: WBAT      Mobility  Bed Mobility               General bed mobility comments: NT OOB with RN  Transfers Overall transfer level: Needs assistance Equipment used: Rolling walker (2 wheeled) Transfers: Sit to/from Stand Sit to Stand: Min assist         General transfer comment: cues for LE management and use of UEs to self assist  Ambulation/Gait Ambulation/Gait assistance: Min assist Ambulation Distance (Feet): 40 Feet Assistive device: Rolling walker (2 wheeled) Gait Pattern/deviations: Step-to pattern;Decreased step length - right;Decreased step length - left;Shuffle;Trunk flexed Gait velocity: decr   General Gait Details: Cues for posture, sequence and position from [employer]            Wheelchair Mobility    Modified Rankin (Stroke Patients Only)       Balance                                             Pertinent Vitals/Pain Pain Assessment: 0-10 Pain Score: 5  Pain Location: L knee Pain Descriptors / Indicators: Aching;Sore Pain Intervention(s): Limited activity within patient's tolerance;Monitored  during session;Premedicated before session;Ice applied    Home Living Family/patient expects to be discharged to:: Private residence Living Arrangements: Parent Available Help at Discharge: Family Type of Home: House Home Access: Stairs to enter Entrance Stairs-Rails: Psychiatric nurse of Steps: [place]: One level Home Equipment: Environmental consultant - 2 wheels;Bedside commode;Cane - single point Additional Comments: Pt's aunt will be staying to assist pt and her father    Prior Function Level of Independence: Independent               Hand Dominance        Extremity/Trunk Assessment   Upper Extremity Assessment: Overall WFL for tasks assessed           Lower Extremity Assessment: LLE deficits/detail   LLE Deficits / Details: 2+/5 quads with AAROM at knee -10- 40  Cervical / Trunk Assessment: Normal  Communication   Communication: No difficulties  Cognition Arousal/Alertness: Awake/alert Behavior During Therapy: WFL for tasks assessed/performed Overall Cognitive Status: Within Functional Limits for tasks assessed                      General Comments      Exercises Total Joint Exercises Ankle Circles/Pumps: AROM;Both;15 reps;Supine Quad Sets: AROM;Both;10 reps;Supine Heel Slides: AAROM;Left;10 reps;Supine Straight Leg Raises: AAROM;Left;10 reps;Supine      Assessment/Plan    PT Assessment Patient needs continued PT services  PT  Diagnosis Difficulty walking   PT Problem List Decreased strength;Decreased range of motion;Decreased activity tolerance;Decreased mobility;Decreased knowledge of use of DME;Pain  PT Treatment Interventions DME instruction;Gait training;Stair training;Functional mobility training;Therapeutic activities;Therapeutic exercise;Patient/family education   PT Goals (Current goals can be found in the Care Plan section) Acute Rehab PT Goals Patient Stated Goal: Resume previous lifestyle with decreased pain PT Goal  Formulation: With patient Time For Goal Achievement: 04/26/14 Potential to Achieve Goals: Good    Frequency 7X/week   Barriers to discharge        Co-evaluation               End of Session Equipment Utilized During Treatment: Gait belt;Left knee immobilizer Activity Tolerance: Patient tolerated treatment well Patient left: in chair;with call bell/phone within reach Nurse Communication: Mobility status         Time: 8682-5749 PT Time Calculation (min) (ACUTE ONLY): 31 min   Charges:   PT Evaluation $Initial PT Evaluation Tier I: 1 Procedure PT Treatments $Therapeutic Exercise: 8-22 mins   PT G Codes:        Terrius Gentile 2014-04-25, 11:56 AM

## 2014-04-19 NOTE — Progress Notes (Signed)
Physical Therapy Treatment Patient Details Name: Alison [name] MRN: [id number] DOB: 1945-01-26 Today's Date: 2014-05-04    History of Present Illness L TKR: s/p R TKR (1/15) and back surgery (3/15)    PT Comments      Follow Up Recommendations  Home health PT     Equipment Recommendations  None recommended by PT    Recommendations for Other Services OT consult     Precautions / Restrictions Precautions Precautions: Knee;Fall Required Braces or Orthoses: Knee Immobilizer - Left Knee Immobilizer - Left: Discontinue once straight leg raise with < 10 degree lag Restrictions Weight Bearing Restrictions: No Other Position/Activity Restrictions: WBAT    Mobility  Bed Mobility Overal bed mobility: Needs Assistance Bed Mobility: Sit to Supine       Sit to supine: Min assist   General bed mobility comments: cues for sequence and use of R LE to self assist  Transfers Overall transfer level: Needs assistance Equipment used: Rolling walker (2 wheeled) Transfers: Sit to/from Stand Sit to Stand: Min assist         General transfer comment: cues for LE management and use of UEs to self assist  Ambulation/Gait Ambulation/Gait assistance: Min assist Ambulation Distance (Feet): 95 Feet Assistive device: Rolling walker (2 wheeled) Gait Pattern/deviations: Step-to pattern;Decreased step length - right;Decreased step length - left;Shuffle;Trunk flexed Gait velocity: decr   General Gait Details: Cues for posture, sequence and position from RW.  Multiple standing rests to complete task   Stairs            Wheelchair Mobility    Modified Rankin (Stroke Patients Only)       Balance                                    Cognition Arousal/Alertness: Awake/alert Behavior During Therapy: WFL for tasks assessed/performed Overall Cognitive Status: Within Functional Limits for tasks assessed                      Exercises      General  Comments        Pertinent Vitals/Pain Pain Assessment: 0-10 Pain Score: 6  Pain Location: L knee Pain Descriptors / Indicators: Aching;Sore Pain Intervention(s): Limited activity within patient's tolerance;Monitored during session;Premedicated before session;Ice applied    Home Living                      Prior Function            PT Goals (current goals can now be found in the care plan section) Acute Rehab PT Goals Patient Stated Goal: Resume previous lifestyle with decreased pain PT Goal Formulation: With patient Time For Goal Achievement: 04/26/14 Potential to Achieve Goals: Good Progress towards PT goals: Progressing toward goals    Frequency  7X/week    PT Plan Current plan remains appropriate    Co-evaluation             End of Session Equipment Utilized During Treatment: Gait belt;Left knee immobilizer Activity Tolerance: Patient tolerated treatment well Patient left: in bed;with call bell/phone within reach     Time: 1325-1354 PT Time Calculation (min) (ACUTE ONLY): 29 min  Charges:  $Gait Training: 23-37 mins                    G Codes:      Alison [name] May 04, 2014, 2:33 PM

## 2014-04-19 NOTE — Discharge Instructions (Addendum)
INSTRUCTIONS AFTER JOINT REPLACEMENT  ° °o Remove items at home which could result in a fall. This includes throw rugs or furniture in walking pathways °o ICE to the affected joint every three hours while awake for 30 minutes at a time, for at least the first 3-5 days, and then as needed for pain and swelling.  Continue to use ice for pain and swelling. You may notice swelling that will progress down to the foot and ankle.  This is normal after surgery.  Elevate your leg when you are not up walking on it.   °o Continue to use the breathing machine you got in the hospital (incentive spirometer) which will help keep your temperature down.  It is common for your temperature to cycle up and down following surgery, especially at night when you are not up moving around and exerting yourself.  The breathing machine keeps your lungs expanded and your temperature down. ° ° °DIET:  As you were doing prior to hospitalization, we recommend a well-balanced diet. ° °DRESSING / WOUND CARE / SHOWERING ° °Keep the surgical dressing until follow up.  The dressing is water proof, so you can shower without any extra covering.  IF THE DRESSING FALLS OFF or the wound gets wet inside, change the dressing with sterile gauze.  Please use good hand washing techniques before changing the dressing.  Do not use any lotions or creams on the incision until instructed by your surgeon.   ° °ACTIVITY ° °o Increase activity slowly as tolerated, but follow the weight bearing instructions below.   °o No driving for 6 weeks or until further direction given by your physician.  You cannot drive while taking narcotics.  °o No lifting or carrying greater than 10 lbs. until further directed by your surgeon. °o Avoid periods of inactivity such as sitting longer than an hour when not asleep. This helps prevent blood clots.  °o You may return to work once you are authorized by your doctor.  ° ° ° °WEIGHT BEARING  ° °Weight bearing as tolerated with assist  device (walker, cane, etc) as directed, use it as long as suggested by your surgeon or therapist, typically at least 4-6 weeks. ° ° °EXERCISES ° °Results after joint replacement surgery are often greatly improved when you follow the exercise, range of motion and muscle strengthening exercises prescribed by your doctor. Safety measures are also important to protect the joint from further injury. Any time any of these exercises cause you to have increased pain or swelling, decrease what you are doing until you are comfortable again and then slowly increase them. If you have problems or questions, call your caregiver or physical therapist for advice.  ° °Rehabilitation is important following a joint replacement. After just a few days of immobilization, the muscles of the leg can become weakened and shrink (atrophy).  These exercises are designed to build up the tone and strength of the thigh and leg muscles and to improve motion. Often times heat used for twenty to thirty minutes before working out will loosen up your tissues and help with improving the range of motion but do not use heat for the first two weeks following surgery (sometimes heat can increase post-operative swelling).  ° °These exercises can be done on a training (exercise) mat, on the floor, on a table or on a bed. Use whatever works the best and is most comfortable for you.    Use music or television while you are exercising so that   the exercises are a pleasant break in your day. This will make your life better with the exercises acting as a break in your routine that you can look forward to.   Perform all exercises about fifteen times, three times per day or as directed.  You should exercise both the operative leg and the other leg as well. ° °Exercises include: °  °• Quad Sets - Tighten up the muscle on the front of the thigh (Quad) and hold for 5-10 seconds.   °• Straight Leg Raises - With your knee straight (if you were given a brace, keep it on),  lift the leg to 60 degrees, hold for 3 seconds, and slowly lower the leg.  Perform this exercise against resistance later as your leg gets stronger.  °• Leg Slides: Lying on your back, slowly slide your foot toward your buttocks, bending your knee up off the floor (only go as far as is comfortable). Then slowly slide your foot back down until your leg is flat on the floor again.  °• Angel Wings: Lying on your back spread your legs to the side as far apart as you can without causing discomfort.  °• Hamstring Strength:  Lying on your back, push your heel against the floor with your leg straight by tightening up the muscles of your buttocks.  Repeat, but this time bend your knee to a comfortable angle, and push your heel against the floor.  You may put a pillow under the heel to make it more comfortable if necessary.  ° °A rehabilitation program following joint replacement surgery can speed recovery and prevent re-injury in the future due to weakened muscles. Contact your doctor or a physical therapist for more information on knee rehabilitation.  ° ° °CONSTIPATION ° °Constipation is defined medically as fewer than three stools per week and severe constipation as less than one stool per week.  Even if you have a regular bowel pattern at home, your normal regimen is likely to be disrupted due to multiple reasons following surgery.  Combination of anesthesia, postoperative narcotics, change in appetite and fluid intake all can affect your bowels.  ° °YOU MUST use at least one of the following options; they are listed in order of increasing strength to get the job done.  They are all available over the counter, and you may need to use some, POSSIBLY even all of these options:   ° °Drink plenty of fluids (prune juice may be helpful) and high fiber foods °Colace 100 mg by mouth twice a day  °Senokot for constipation as directed and as needed Dulcolax (bisacodyl), take with full glass of water  °Miralax (polyethylene glycol)  once or twice a day as needed. ° °If you have tried all these things and are unable to have a bowel movement in the first 3-4 days after surgery call either your surgeon or your primary doctor.   ° °If you experience loose stools or diarrhea, hold the medications until you stool forms back up.  If your symptoms do not get better within 1 week or if they get worse, check with your doctor.  If you experience "the worst abdominal pain ever" or develop nausea or vomiting, please contact the office immediately for further recommendations for treatment. ° ° °ITCHING:  If you experience itching with your medications, try taking only a single pain pill, or even half a pain pill at a time.  You can also use Benadryl over the counter for itching or also to   help with sleep.   TED HOSE STOCKINGS:  Use stockings on both legs until for at least 2 weeks or as directed by physician office. They may be removed at night for sleeping.  MEDICATIONS:  See your medication summary on the After Visit Summary that nursing will review with you.  You may have some home medications which will be placed on hold until you complete the course of blood thinner medication.  It is important for you to complete the blood thinner medication as prescribed.  PRECAUTIONS:  If you experience chest pain or shortness of breath - call 911 immediately for transfer to the hospital emergency department.   If you develop a fever greater that 101 F, purulent drainage from wound, increased redness or drainage from wound, foul odor from the wound/dressing, or calf pain - CONTACT YOUR SURGEON.                                                   FOLLOW-UP APPOINTMENTS:  If you do not already have a post-op appointment, please call the office for an appointment to be seen by your surgeon.  Guidelines for how soon to be seen are listed in your After Visit Summary, but are typically between 1-4 weeks after surgery.   MAKE SURE YOU:   Understand these  instructions.   Get help right away if you are not doing well or get worse.    Thank you for letting us be a part of your medical care team.  It is a privilege we respect greatly.  We hope these instructions will help you stay on track for a fast and full recovery!     Information on my medicine - XARELTO (Rivaroxaban)  This medication education was reviewed with me or my healthcare representative as part of my discharge preparation.  The pharmacist that spoke with me during my hospital stay was:  Angela Adam [facility]  Why was Xarelto prescribed for you? Xarelto was prescribed for you to reduce the risk of blood clots forming after orthopedic surgery. The medical term for these abnormal blood clots is venous thromboembolism (VTE).  What do you need to know about xarelto ? Take your Xarelto ONCE DAILY at the same time every day. You may take it either with or without food.  If you have difficulty swallowing the tablet whole, you may crush it and mix in applesauce just prior to taking your dose.  Take Xarelto exactly as prescribed by your doctor and DO NOT stop taking Xarelto without talking to the doctor who prescribed the medication.  Stopping without other VTE prevention medication to take the place of Xarelto may increase your risk of developing a clot.  After discharge, you should have regular check-up appointments with your healthcare provider that is prescribing your Xarelto.    What do you do if you miss a dose? If you miss a dose, take it as soon as you remember on the same day then continue your regularly scheduled once daily regimen the next day. Do not take two doses of Xarelto on the same day.   Important Safety Information A possible side effect of Xarelto is bleeding. You should call your healthcare provider right away if you experience any of the following: ? Bleeding from an injury or your nose that does not stop. ? Unusual colored urine (  red or dark brown)  or unusual colored stools (red or black). ? Unusual bruising for unknown reasons. ? A serious fall or if you hit your head (even if there is no bleeding).  Some medicines may interact with Xarelto and might increase your risk of bleeding while on Xarelto. To help avoid this, consult your healthcare provider or pharmacist prior to using any new prescription or non-prescription medications, including herbals, vitamins, non-steroidal anti-inflammatory drugs (NSAIDs) and supplements.  This website has more information on Xarelto: [email or web address]

## 2014-04-19 NOTE — Progress Notes (Signed)
OT Cancellation Note  Patient Details Name: Alison [name] MRN: [id number] DOB: [birth year]   Cancelled Treatment:    Reason Eval/Treat Not Completed: OT screened, no needs identified, will sign off  Pt does not have any questions regarding ADL activity   Jaymin Waln, Thereasa Parkin 04/19/2014, 12:32 PM

## 2014-04-20 LAB — GLUCOSE, CAPILLARY
GLUCOSE-CAPILLARY: 132 mg/dL — AB (ref 70–99)
Glucose-Capillary: 145 mg/dL — ABNORMAL HIGH (ref 70–99)

## 2014-04-20 LAB — CBC
HCT: 31.3 % — ABNORMAL LOW (ref 36.0–46.0)
Hemoglobin: 10.4 g/dL — ABNORMAL LOW (ref 12.0–15.0)
MCH: 29.1 pg (ref 26.0–34.0)
MCHC: 33.2 g/dL (ref 30.0–36.0)
MCV: 87.4 fL (ref 78.0–100.0)
PLATELETS: 265 10*3/uL (ref 150–400)
RBC: 3.58 MIL/uL — ABNORMAL LOW (ref 3.87–5.11)
RDW: 13.4 % (ref 11.5–15.5)
WBC: 18.4 10*3/uL — ABNORMAL HIGH (ref 4.0–10.5)

## 2014-04-20 LAB — BASIC METABOLIC PANEL
ANION GAP: 7 (ref 5–15)
BUN: 10 mg/dL (ref 6–23)
CHLORIDE: 95 mmol/L — AB (ref 96–112)
CO2: 27 mmol/L (ref 19–32)
Calcium: 8.6 mg/dL (ref 8.4–10.5)
Creatinine, Ser: 0.68 mg/dL (ref 0.50–1.10)
GFR calc Af Amer: >90 mL/min (ref >90)
GFR calc non Af Amer: 87 mL/min — ABNORMAL LOW (ref >90)
Glucose, Bld: 147 mg/dL — ABNORMAL HIGH (ref 70–99)
POTASSIUM: 3.7 mmol/L (ref 3.5–5.1)
Sodium: 129 mmol/L — ABNORMAL LOW (ref 135–145)

## 2014-04-20 MED ORDER — DOCUSATE SODIUM 100 MG PO CAPS: 100.0000 mg | ORAL_CAPSULE | Freq: Two times a day (BID) | ORAL | Status: DC

## 2014-04-20 MED ORDER — OXYCODONE HCL 5 MG PO TABS: 5.0000 mg | ORAL_TABLET | ORAL | Status: DC | PRN

## 2014-04-20 MED ORDER — RIVAROXABAN 10 MG PO TABS: 10.0000 mg | ORAL_TABLET | Freq: Every day | ORAL | Status: DC

## 2014-04-20 MED ORDER — METHOCARBAMOL 500 MG PO TABS: 500.0000 mg | ORAL_TABLET | Freq: Four times a day (QID) | ORAL | Status: DC | PRN

## 2014-04-20 NOTE — Progress Notes (Signed)
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  Reviewed don/doff KI.  Follow Up Recommendations  Home health PT     Equipment Recommendations  None recommended by PT    Recommendations for Other Services OT consult     Precautions / Restrictions Precautions Precautions: Knee;Fall Required Braces or Orthoses: Knee Immobilizer - Left Knee Immobilizer - Left: Discontinue once straight leg raise with < 10 degree lag Restrictions Weight Bearing Restrictions: No Other Position/Activity Restrictions: WBAT    Mobility  Bed Mobility               General bed mobility comments: Pt OOB with nursing and declines back to bed  Transfers Overall transfer level: Needs assistance Equipment used: Rolling walker (2 wheeled) Transfers: Sit to/from Stand Sit to Stand: Min guard;Supervision         General transfer comment: cues for LE management and use of UEs to self assist  Ambulation/Gait Ambulation/Gait assistance: Min guard;Supervision Ambulation Distance (Feet): 111 Feet Assistive device: Rolling walker (2 wheeled) Gait Pattern/deviations: Step-to pattern;Decreased step length - right;Decreased step length - left;Shuffle Gait velocity: decr   General Gait Details: Cues for posture, sequence and position from RW.     Stairs Stairs:  (RAMP)          Wheelchair Mobility    Modified Rankin (Stroke Patients Only)       Balance                                    Cognition Arousal/Alertness: Awake/alert Behavior During Therapy: WFL for tasks assessed/performed Overall Cognitive Status: Within Functional Limits for tasks assessed                      Exercises Total Joint Exercises Ankle Circles/Pumps: AROM;Both;15  reps;Supine Quad Sets: AROM;Both;Supine;15 reps Heel Slides: AAROM;Left;Supine;20 reps Straight Leg Raises: AAROM;Left;Supine;15 reps Goniometric ROM: AAROM L knee -10- 45    General Comments        Pertinent Vitals/Pain Pain Assessment: 0-10 Pain Score: 5  Pain Location: L knee Pain Descriptors / Indicators: Aching;Sore Pain Intervention(s): Limited activity within patient's tolerance;Monitored during session;Premedicated before session;Ice applied    Home Living                      Prior Function            PT Goals (current goals can now be found in the care plan section) Acute Rehab PT Goals Patient Stated Goal: Resume previous lifestyle with decreased pain PT Goal Formulation: With patient Time For Goal Achievement: 04/26/14 Potential to Achieve Goals: Good Progress towards PT goals: Progressing toward goals    Frequency  7X/week    PT Plan Current plan remains appropriate    Co-evaluation             End of Session Equipment Utilized During Treatment: Gait belt;Left knee immobilizer Activity Tolerance: Patient tolerated treatment well Patient left: in chair;with call bell/phone within reach     Time: 1105-1134 PT Time Calculation (min) (ACUTE ONLY): 29 min  Charges:  $Gait Training: 8-22 mins $Therapeutic Exercise: 8-22 mins  G Codes:      Sulema Braid 05-12-2014, 12:31 PM

## 2014-04-20 NOTE — Progress Notes (Signed)
Subjective: 2 Days Post-Op Procedure(s) (LRB): LEFT TOTAL KNEE ARTHROPLASTY (Left) Patient reports pain as 3 on 0-10 scale.  WBC elevated but Afebrile.No signs of a wound problem.  Objective: Vital signs in last 24 hours: Temp:  [98.2 F (36.8 C)-100.6 F (38.1 C)] 98.2 F (36.8 C) (04/08 0522) Pulse Rate:  [84-102] 102 (04/08 0522) Resp:  [16-20] 18 (04/08 0522) BP: (133-180)/(61-90) 151/69 mmHg (04/08 0522) SpO2:  [95 %-99 %] 99 % (04/08 0522)  Intake/Output from previous day: 04/07 0701 - 04/08 0700 In: 2340 [P.O.:420; I.V.:1920] Out: 2651 [Urine:2550; Emesis/NG output:1; Drains:100] Intake/Output this shift:     Recent Labs  04/19/14 0425 04/20/14 0448  HGB 10.5* 10.4*    Recent Labs  04/19/14 0425 04/20/14 0448  WBC 16.7* 18.4*  RBC 3.61* 3.58*  HCT 32.1* 31.3*  PLT 291 265    Recent Labs  04/19/14 0425 04/20/14 0448  NA 138 129*  K 4.2 3.7  CL 102 95*  CO2 27 27  BUN 12 10  CREATININE 0.80 0.68  GLUCOSE 137* 147*  CALCIUM 9.1 8.6   No results for input(s): LABPT, INR in the last 72 hours.  Neurovascular intact No cellulitis present  Assessment/Plan: 2 Days Post-Op Procedure(s) (LRB): LEFT TOTAL KNEE ARTHROPLASTY (Left) Discharge home with home health.Will follow closely because of WBC elevation.  Vincent Ehrler A 04/20/2014, 7:25 AM

## 2014-04-20 NOTE — Progress Notes (Signed)
Rn reviewed discharge instructions with patient. All questions answered.   Paperwork and prescriptions given.   NT rolled patient down in wheelchair to family car.

## 2014-04-21 DIAGNOSIS — E119 Type 2 diabetes mellitus without complications: Secondary | ICD-10-CM | POA: Diagnosis not present

## 2014-04-21 DIAGNOSIS — E785 Hyperlipidemia, unspecified: Secondary | ICD-10-CM | POA: Diagnosis not present

## 2014-04-21 DIAGNOSIS — Z96652 Presence of left artificial knee joint: Secondary | ICD-10-CM | POA: Diagnosis not present

## 2014-04-21 DIAGNOSIS — I10 Essential (primary) hypertension: Secondary | ICD-10-CM | POA: Diagnosis not present

## 2014-04-21 DIAGNOSIS — Z720 Tobacco use: Secondary | ICD-10-CM | POA: Diagnosis not present

## 2014-04-21 DIAGNOSIS — E669 Obesity, unspecified: Secondary | ICD-10-CM | POA: Diagnosis not present

## 2014-04-21 DIAGNOSIS — Z471 Aftercare following joint replacement surgery: Secondary | ICD-10-CM | POA: Diagnosis not present

## 2014-04-23 DIAGNOSIS — Z96652 Presence of left artificial knee joint: Secondary | ICD-10-CM | POA: Diagnosis not present

## 2014-04-23 DIAGNOSIS — E785 Hyperlipidemia, unspecified: Secondary | ICD-10-CM | POA: Diagnosis not present

## 2014-04-23 DIAGNOSIS — I10 Essential (primary) hypertension: Secondary | ICD-10-CM | POA: Diagnosis not present

## 2014-04-23 DIAGNOSIS — Z471 Aftercare following joint replacement surgery: Secondary | ICD-10-CM | POA: Diagnosis not present

## 2014-04-23 DIAGNOSIS — E119 Type 2 diabetes mellitus without complications: Secondary | ICD-10-CM | POA: Diagnosis not present

## 2014-04-23 DIAGNOSIS — E669 Obesity, unspecified: Secondary | ICD-10-CM | POA: Diagnosis not present

## 2014-04-23 NOTE — Discharge Summary (Signed)
Physician Discharge Summary   Patient ID: Alison [name] MRN: [id number] DOB/AGE: 1945/08/17 70 y.o.  Admit date: 04/18/2014 Discharge date: 04/20/2014  Primary Diagnosis: Primary osteoarthritis, left knee   Admission Diagnoses:  Past Medical History  Diagnosis Date  . Unspecified vitamin D deficiency   . Obesity, unspecified   . Goiter, specified as simple   . Pernicious anemia   . Hyperlipidemia LDL goal < 100   . Unspecified glaucoma   . Benign essential hypertension   . Diverticulitis of colon (without mention of hemorrhage)   . Osteoarthrosis, unspecified whether generalized or localized, lower leg   . Osteopenia   . Numbness     TOES RT FOOT and toes left foot  . Diabetes mellitus without complication   . Other specified disease of nail     FUNGUS OF FINGERNAILS   Discharge Diagnoses:   Active Problems:   History of total knee arthroplasty   Postoperative anemia due to acute blood loss  Estimated body mass index is 31.65 kg/(m^2) as calculated from the following:   Height as of this encounter: 5' 6" (1.676 m).   Weight as of this encounter: 88.905 kg (196 lb).  Procedure:  Procedure(s) (LRB): LEFT TOTAL KNEE ARTHROPLASTY (Left)   Consults: None  HPI: Alison [name], 69 y.o. female, has a history of pain and functional disability in the left knee due to arthritis and has failed non-surgical conservative treatments for greater than 12 weeks to includeNSAID's and/or analgesics, corticosteriod injections, use of assistive devices and activity modification. Onset of symptoms was gradual, starting 5 years ago with gradually worsening course since that time. The patient noted no past surgery on the left knee(s). Patient currently rates pain in the left knee(s) at 7 out of 10 with activity. Patient has night pain, worsening of pain with activity and weight bearing, pain that interferes with activities of daily living, pain with passive range of motion, crepitus and joint  swelling. Patient has evidence of periarticular osteophytes and joint space narrowing by imaging studies. There is no active infection.   Laboratory Data: Admission on 04/18/2014, Discharged on 04/20/2014  Component Date Value Ref Range Status  . ABO/RH(D) 04/18/2014 B POS   Final  . Antibody Screen 04/18/2014 NEG   Final  . Sample Expiration 04/18/2014 04/21/2014   Final  . Glucose-Capillary 04/18/2014 113* 70 - 99 mg/dL Final  . Comment 1 04/18/2014 Notify RN   Final  . Glucose-Capillary 04/18/2014 135* 70 - 99 mg/dL Final  . Comment 1 04/18/2014 Notify RN   Final  . Comment 2 04/18/2014 Document in Chart   Final  . WBC 04/19/2014 16.7* 4.0 - 10.5 K/uL Final  . RBC 04/19/2014 3.61* 3.87 - 5.11 MIL/uL Final  . Hemoglobin 04/19/2014 10.5* 12.0 - 15.0 g/dL Final  . HCT 04/19/2014 32.1* 36.0 - 46.0 % Final  . MCV 04/19/2014 88.9  78.0 - 100.0 fL Final  . MCH 04/19/2014 29.1  26.0 - 34.0 pg Final  . MCHC 04/19/2014 32.7  30.0 - 36.0 g/dL Final  . RDW 04/19/2014 13.4  11.5 - 15.5 % Final  . Platelets 04/19/2014 291  150 - 400 K/uL Final  . Sodium 04/19/2014 138  135 - 145 mmol/L Final  . Potassium 04/19/2014 4.2  3.5 - 5.1 mmol/L Final  . Chloride 04/19/2014 102  96 - 112 mmol/L Final  . CO2 04/19/2014 27  19 - 32 mmol/L Final  . Glucose, Bld 04/19/2014 137* 70 - 99 mg/dL Final  . BUN  04/19/2014 12  6 - 23 mg/dL Final  . Creatinine, Ser 04/19/2014 0.80  0.50 - 1.10 mg/dL Final  . Calcium 04/19/2014 9.1  8.4 - 10.5 mg/dL Final  . GFR calc non Af Amer 04/19/2014 74* >90 mL/min Final  . GFR calc Af Amer 04/19/2014 85* >90 mL/min Final   Comment: (NOTE) The eGFR has been calculated using the CKD EPI equation. This calculation has not been validated in all clinical situations. eGFR's persistently <90 mL/min signify possible Chronic Kidney Disease.   . Anion gap 04/19/2014 9  5 - 15 Final  . Glucose-Capillary 04/18/2014 167* 70 - 99 mg/dL Final  . Glucose-Capillary 04/18/2014 138* 70  - 99 mg/dL Final  . Glucose-Capillary 04/19/2014 125* 70 - 99 mg/dL Final  . Glucose-Capillary 04/19/2014 115* 70 - 99 mg/dL Final  . WBC 04/20/2014 18.4* 4.0 - 10.5 K/uL Final  . RBC 04/20/2014 3.58* 3.87 - 5.11 MIL/uL Final  . Hemoglobin 04/20/2014 10.4* 12.0 - 15.0 g/dL Final  . HCT 04/20/2014 31.3* 36.0 - 46.0 % Final  . MCV 04/20/2014 87.4  78.0 - 100.0 fL Final  . MCH 04/20/2014 29.1  26.0 - 34.0 pg Final  . MCHC 04/20/2014 33.2  30.0 - 36.0 g/dL Final  . RDW 04/20/2014 13.4  11.5 - 15.5 % Final  . Platelets 04/20/2014 265  150 - 400 K/uL Final  . Sodium 04/20/2014 129* 135 - 145 mmol/L Final   Comment: DELTA CHECK NOTED REPEATED TO VERIFY   . Potassium 04/20/2014 3.7  3.5 - 5.1 mmol/L Final  . Chloride 04/20/2014 95* 96 - 112 mmol/L Final  . CO2 04/20/2014 27  19 - 32 mmol/L Final  . Glucose, Bld 04/20/2014 147* 70 - 99 mg/dL Final  . BUN 04/20/2014 10  6 - 23 mg/dL Final  . Creatinine, Ser 04/20/2014 0.68  0.50 - 1.10 mg/dL Final  . Calcium 04/20/2014 8.6  8.4 - 10.5 mg/dL Final  . GFR calc non Af Amer 04/20/2014 87* >90 mL/min Final  . GFR calc Af Amer 04/20/2014 >90  >90 mL/min Final   Comment: (NOTE) The eGFR has been calculated using the CKD EPI equation. This calculation has not been validated in all clinical situations. eGFR's persistently <90 mL/min signify possible Chronic Kidney Disease.   . Anion gap 04/20/2014 7  5 - 15 Final  . Glucose-Capillary 04/19/2014 119* 70 - 99 mg/dL Final  . Glucose-Capillary 04/19/2014 172* 70 - 99 mg/dL Final  . Comment 1 04/19/2014 Notify RN   Final  . Comment 2 04/19/2014 Document in Chart   Final  . Glucose-Capillary 04/20/2014 145* 70 - 99 mg/dL Final  . Glucose-Capillary 04/20/2014 132* 70 - 99 mg/dL Final  Hospital Outpatient Visit on 04/11/2014  Component Date Value Ref Range Status  . aPTT 04/11/2014 34  24 - 37 seconds Final  . WBC 04/11/2014 10.5  4.0 - 10.5 K/uL Final  . RBC 04/11/2014 4.39  3.87 - 5.11 MIL/uL Final    . Hemoglobin 04/11/2014 12.9  12.0 - 15.0 g/dL Final  . HCT 04/11/2014 39.2  36.0 - 46.0 % Final  . MCV 04/11/2014 89.3  78.0 - 100.0 fL Final  . MCH 04/11/2014 29.4  26.0 - 34.0 pg Final  . MCHC 04/11/2014 32.9  30.0 - 36.0 g/dL Final  . RDW 04/11/2014 13.3  11.5 - 15.5 % Final  . Platelets 04/11/2014 301  150 - 400 K/uL Final  . Neutrophils Relative % 04/11/2014 70  43 - 77 % Final  .  Neutro Abs 04/11/2014 7.3  1.7 - 7.7 K/uL Final  . Lymphocytes Relative 04/11/2014 21  12 - 46 % Final  . Lymphs Abs 04/11/2014 2.2  0.7 - 4.0 K/uL Final  . Monocytes Relative 04/11/2014 8  3 - 12 % Final  . Monocytes Absolute 04/11/2014 0.8  0.1 - 1.0 K/uL Final  . Eosinophils Relative 04/11/2014 1  0 - 5 % Final  . Eosinophils Absolute 04/11/2014 0.1  0.0 - 0.7 K/uL Final  . Basophils Relative 04/11/2014 0  0 - 1 % Final  . Basophils Absolute 04/11/2014 0.0  0.0 - 0.1 K/uL Final  . Sodium 04/11/2014 138  135 - 145 mmol/L Final  . Potassium 04/11/2014 4.4  3.5 - 5.1 mmol/L Final  . Chloride 04/11/2014 102  96 - 112 mmol/L Final  . CO2 04/11/2014 27  19 - 32 mmol/L Final  . Glucose, Bld 04/11/2014 71  70 - 99 mg/dL Final  . BUN 04/11/2014 13  6 - 23 mg/dL Final  . Creatinine, Ser 04/11/2014 0.73  0.50 - 1.10 mg/dL Final  . Calcium 04/11/2014 9.5  8.4 - 10.5 mg/dL Final  . Total Protein 04/11/2014 7.9  6.0 - 8.3 g/dL Final  . Albumin 04/11/2014 4.3  3.5 - 5.2 g/dL Final  . AST 04/11/2014 33  0 - 37 U/L Final  . ALT 04/11/2014 20  0 - 35 U/L Final  . Alkaline Phosphatase 04/11/2014 59  39 - 117 U/L Final  . Total Bilirubin 04/11/2014 1.0  0.3 - 1.2 mg/dL Final  . GFR calc non Af Amer 04/11/2014 85* >90 mL/min Final  . GFR calc Af Amer 04/11/2014 >90  >90 mL/min Final   Comment: (NOTE) The eGFR has been calculated using the CKD EPI equation. This calculation has not been validated in all clinical situations. eGFR's persistently <90 mL/min signify possible Chronic Kidney Disease.   . Anion gap  04/11/2014 9  5 - 15 Final  . Prothrombin Time 04/11/2014 13.1  11.6 - 15.2 seconds Final  . INR 04/11/2014 0.98  0.00 - 1.49 Final  . Color, Urine 04/11/2014 YELLOW  YELLOW Final  . APPearance 04/11/2014 CLEAR  CLEAR Final  . Specific Gravity, Urine 04/11/2014 1.007  1.005 - 1.030 Final  . pH 04/11/2014 5.5  5.0 - 8.0 Final  . Glucose, UA 04/11/2014 NEGATIVE  NEGATIVE mg/dL Final  . Hgb urine dipstick 04/11/2014 MODERATE* NEGATIVE Final  . Bilirubin Urine 04/11/2014 NEGATIVE  NEGATIVE Final  . Ketones, ur 04/11/2014 NEGATIVE  NEGATIVE mg/dL Final  . Protein, ur 04/11/2014 NEGATIVE  NEGATIVE mg/dL Final  . Urobilinogen, UA 04/11/2014 0.2  0.0 - 1.0 mg/dL Final  . Nitrite 04/11/2014 NEGATIVE  NEGATIVE Final  . Leukocytes, UA 04/11/2014 NEGATIVE  NEGATIVE Final  . MRSA, PCR 04/11/2014 NEGATIVE  NEGATIVE Final  . Staphylococcus aureus 04/11/2014 NEGATIVE  NEGATIVE Final   Comment:        The Xpert SA Assay (FDA approved for NASAL specimens in patients over 26 years of age), is one component of a comprehensive surveillance program.  Test performance has been validated by [facility] for patients greater than or equal to 41 year old. It is not intended to diagnose infection nor to guide or monitor treatment.   . Squamous Epithelial / LPF 04/11/2014 RARE  RARE Final  . RBC / HPF 04/11/2014 3-6  <3 RBC/hpf Final  Appointment on 03/19/2014  Component Date Value Ref Range Status  . Cholesterol, Total 03/19/2014 150  100 -  199 mg/dL Final  . Triglycerides 03/19/2014 86  0 - 149 mg/dL Final  . HDL 03/19/2014 39* >39 mg/dL Final   Comment: According to ATP-III Guidelines, HDL-C >59 mg/dL is considered a negative risk factor for CHD.   Marland Kitchen VLDL Cholesterol Cal 03/19/2014 17  5 - 40 mg/dL Final  . LDL Calculated 03/19/2014 94  0 - 99 mg/dL Final  . Chol/HDL Ratio 03/19/2014 3.8  0.0 - 4.4 ratio units Final   Comment:                                   T. Chol/HDL Ratio                                              Men  Women                               1/2 Avg.Risk  3.4    3.3                                   Avg.Risk  5.0    4.4                                2X Avg.Risk  9.6    7.1                                3X Avg.Risk 23.4   11.0   . Hgb A1c MFr Bld 03/19/2014 5.9* 4.8 - 5.6 % Final   Comment:          Pre-diabetes: 5.7 - 6.4          Diabetes: >6.4          Glycemic control for adults with diabetes: <7.0   . Est. average glucose Bld gHb Est-m* 03/19/2014 123   Final     X-Rays:Dg Chest 2 View  04/11/2014   CLINICAL DATA:  Preoperative for left knee surgery. Tobacco use. Hypertension.  EXAM: CHEST  2 VIEW  COMPARISON:  02/02/2013  FINDINGS: Atherosclerotic calcification of the aortic arch. Heart size within normal limits.  Mild thoracic spondylosis.  The lungs appear clear.  No pleural effusion.  IMPRESSION: 1. Atherosclerotic aortic arch. Otherwise, no significant abnormalities are observed.   Electronically Signed   By: Van Clines M.D.   On: 04/11/2014 12:02    EKG: Orders placed or performed in visit on 04/11/14  . EKG 12-Lead     Hospital Course: Alison [name] is a 69 y.o. who was admitted to [facility] They were brought to the operating room on 04/18/2014 and underwent Procedure(s): LEFT TOTAL KNEE ARTHROPLASTY.  Patient tolerated the procedure well and was later transferred to the recovery room and then to the orthopaedic floor for postoperative care.  They were given PO and IV analgesics for pain control following their surgery.  They were given 24 hours of postoperative antibiotics of  Anti-infectives    Start     Dose/Rate Route Frequency Ordered Stop   04/18/14 1400  ceFAZolin (ANCEF)  IVPB 1 g/50 mL premix     1 g 100 mL/hr over 30 Minutes Intravenous Every 6 hours 04/18/14 1317 04/18/14 2029   04/18/14 0907  polymyxin B 500,000 Units, bacitracin 50,000 Units in sodium chloride irrigation 0.9 % 500 mL irrigation  Status:  Discontinued        As needed 04/18/14 0907 04/18/14 1052   04/18/14 0621  ceFAZolin (ANCEF) IVPB 2 g/50 mL premix     2 g 100 mL/hr over 30 Minutes Intravenous On call to O.R. 04/18/14 2878 04/18/14 0834     and started on DVT prophylaxis in the form of Xarelto.   PT and OT were ordered for total joint protocol.  Discharge planning consulted to help with postop disposition and equipment needs.  Patient had a difficult night on the evening of surgery due to poor pain control.  They started to get up OOB with therapy on day one. Hemovac drain was pulled without difficulty.  Continued to work with therapy into day two and had better pain control.   Patient was seen in rounds and was ready to go home.   Diet: Cardiac diet and Diabetic diet Activity:WBAT Follow-up:in 2 weeks Disposition - Home Discharged Condition: stable   Discharge Instructions    Call MD / Call 911    Complete by:  As directed   If you experience chest pain or shortness of breath, CALL 911 and be transported to the hospital emergency room.  If you develope a fever above 101 F, pus (white drainage) or increased drainage or redness at the wound, or calf pain, call your surgeon's office.     Constipation Prevention    Complete by:  As directed   Drink plenty of fluids.  Prune juice may be helpful.  You may use a stool softener, such as Colace (over the counter) 100 mg twice a day.  Use MiraLax (over the counter) for constipation as needed.     Diet - low sodium heart healthy    Complete by:  As directed      Diet Carb Modified    Complete by:  As directed      Discharge instructions    Complete by:  As directed   INSTRUCTIONS AFTER JOINT REPLACEMENT   Remove items at home which could result in a fall. This includes throw rugs or furniture in walking pathways ICE to the affected joint every three hours while awake for 30 minutes at a time, for at least the first 3-5 days, and then as needed for pain and swelling.  Continue to use ice for  pain and swelling. You may notice swelling that will progress down to the foot and ankle.  This is normal after surgery.  Elevate your leg when you are not up walking on it.   Continue to use the breathing machine you got in the hospital (incentive spirometer) which will help keep your temperature down.  It is common for your temperature to cycle up and down following surgery, especially at night when you are not up moving around and exerting yourself.  The breathing machine keeps your lungs expanded and your temperature down.   DIET:  As you were doing prior to hospitalization, we recommend a well-balanced diet.  DRESSING / WOUND CARE / SHOWERING  Keep the surgical dressing until follow up.  The dressing is water proof, so you can shower without any extra covering.  IF THE DRESSING FALLS OFF or the wound gets wet inside, change the  dressing with sterile gauze.  Please use good hand washing techniques before changing the dressing.  Do not use any lotions or creams on the incision until instructed by your surgeon.    ACTIVITY  Increase activity slowly as tolerated, but follow the weight bearing instructions below.   No driving for 6 weeks or until further direction given by your physician.  You cannot drive while taking narcotics.  No lifting or carrying greater than 10 lbs. until further directed by your surgeon. Avoid periods of inactivity such as sitting longer than an hour when not asleep. This helps prevent blood clots.  You may return to work once you are authorized by your doctor.     WEIGHT BEARING   Weight bearing as tolerated with assist device (walker, cane, etc) as directed, use it as long as suggested by your surgeon or therapist, typically at least 4-6 weeks.   EXERCISES  Results after joint replacement surgery are often greatly improved when you follow the exercise, range of motion and muscle strengthening exercises prescribed by your doctor. Safety measures are also  important to protect the joint from further injury. Any time any of these exercises cause you to have increased pain or swelling, decrease what you are doing until you are comfortable again and then slowly increase them. If you have problems or questions, call your caregiver or physical therapist for advice.   Rehabilitation is important following a joint replacement. After just a few days of immobilization, the muscles of the leg can become weakened and shrink (atrophy).  These exercises are designed to build up the tone and strength of the thigh and leg muscles and to improve motion. Often times heat used for twenty to thirty minutes before working out will loosen up your tissues and help with improving the range of motion but do not use heat for the first two weeks following surgery (sometimes heat can increase post-operative swelling).   These exercises can be done on a training (exercise) mat, on the floor, on a table or on a bed. Use whatever works the best and is most comfortable for you.    Use music or television while you are exercising so that the exercises are a pleasant break in your day. This will make your life better with the exercises acting as a break in your routine that you can look forward to.   Perform all exercises about fifteen times, three times per day or as directed.  You should exercise both the operative leg and the other leg as well.   Exercises include:   Quad Sets - Tighten up the muscle on the front of the thigh (Quad) and hold for 5-10 seconds.   Straight Leg Raises - With your knee straight (if you were given a brace, keep it on), lift the leg to 60 degrees, hold for 3 seconds, and slowly lower the leg.  Perform this exercise against resistance later as your leg gets stronger.  Leg Slides: Lying on your back, slowly slide your foot toward your buttocks, bending your knee up off the floor (only go as far as is comfortable). Then slowly slide your foot back down until your  leg is flat on the floor again.  Angel Wings: Lying on your back spread your legs to the side as far apart as you can without causing discomfort.  Hamstring Strength:  Lying on your back, push your heel against the floor with your leg straight by tightening up the muscles of your buttocks.  Repeat, but this time bend your knee to a comfortable angle, and push your heel against the floor.  You may put a pillow under the heel to make it more comfortable if necessary.   A rehabilitation program following joint replacement surgery can speed recovery and prevent re-injury in the future due to weakened muscles. Contact your doctor or a physical therapist for more information on knee rehabilitation.    CONSTIPATION  Constipation is defined medically as fewer than three stools per week and severe constipation as less than one stool per week.  Even if you have a regular bowel pattern at home, your normal regimen is likely to be disrupted due to multiple reasons following surgery.  Combination of anesthesia, postoperative narcotics, change in appetite and fluid intake all can affect your bowels.   YOU MUST use at least one of the following options; they are listed in order of increasing strength to get the job done.  They are all available over the counter, and you may need to use some, POSSIBLY even all of these options:    Drink plenty of fluids (prune juice may be helpful) and high fiber foods Colace 100 mg by mouth twice a day  Senokot for constipation as directed and as needed Dulcolax (bisacodyl), take with full glass of water  Miralax (polyethylene glycol) once or twice a day as needed.  If you have tried all these things and are unable to have a bowel movement in the first 3-4 days after surgery call either your surgeon or your primary doctor.    If you experience loose stools or diarrhea, hold the medications until you stool forms back up.  If your symptoms do not get better within 1 week or if  they get worse, check with your doctor.  If you experience "the worst abdominal pain ever" or develop nausea or vomiting, please contact the office immediately for further recommendations for treatment.   ITCHING:  If you experience itching with your medications, try taking only a single pain pill, or even half a pain pill at a time.  You can also use Benadryl over the counter for itching or also to help with sleep.   TED HOSE STOCKINGS:  Use stockings on both legs until for at least 2 weeks or as directed by physician office. They may be removed at night for sleeping.  MEDICATIONS:  See your medication summary on the "After Visit Summary" that nursing will review with you.  You may have some home medications which will be placed on hold until you complete the course of blood thinner medication.  It is important for you to complete the blood thinner medication as prescribed.  PRECAUTIONS:  If you experience chest pain or shortness of breath - call 911 immediately for transfer to the hospital emergency department.   If you develop a fever greater that 101 F, purulent drainage from wound, increased redness or drainage from wound, foul odor from the wound/dressing, or calf pain - CONTACT YOUR SURGEON.                                                   FOLLOW-UP APPOINTMENTS:  If you do not already have a post-op appointment, please call the office for an appointment to be seen by your surgeon.  Guidelines for how soon to be seen are listed  in your "After Visit Summary", but are typically between 1-4 weeks after surgery.   MAKE SURE YOU:  Understand these instructions.  Get help right away if you are not doing well or get worse.    Thank you for letting us be a part of your medical care team.  It is a privilege we respect greatly.  We hope these instructions will help you stay on track for a fast and full recovery!     Driving restrictions    Complete by:  As directed   No driving     Increase  activity slowly as tolerated    Complete by:  As directed             Medication List    STOP taking these medications        aspirin 81 MG tablet     celecoxib 200 MG capsule  Commonly known as:  CELEBREX     HYDROcodone-acetaminophen 5-325 MG per tablet  Commonly known as:  NORCO/VICODIN     multivitamin with minerals Tabs tablet      TAKE these medications        carboxymethylcellulose 0.5 % Soln  Commonly known as:  REFRESH PLUS  Place 1 drop into both eyes 3 (three) times daily as needed (dry eyes).     docusate sodium 100 MG capsule  Commonly known as:  COLACE  Take 1 capsule (100 mg total) by mouth 2 (two) times daily.     ferrous sulfate 325 (65 FE) MG tablet  Take 1 tablet (325 mg total) by mouth 2 (two) times daily with a meal.     latanoprost 0.005 % ophthalmic solution  Commonly known as:  XALATAN  INSTILL 1 DROP IN BOTH EYES EVERY DAY AT BEDTIME     lisinopril 5 MG tablet  Commonly known as:  PRINIVIL,ZESTRIL  Take 1 tablet (5 mg total) by mouth daily.     metFORMIN 500 MG tablet  Commonly known as:  GLUCOPHAGE  TAKE 1/2 TABLET BY MOUTH EVERY EVENING AT SUPPER     methocarbamol 500 MG tablet  Commonly known as:  ROBAXIN  Take 1 tablet (500 mg total) by mouth every 6 (six) hours as needed for muscle spasms.     omega-3 acid ethyl esters 1 G capsule  Commonly known as:  LOVAZA  Take 2 capsules (2 g total) by mouth daily.     oxyCODONE 5 MG immediate release tablet  Commonly known as:  Oxy IR/ROXICODONE  Take 1-3 tablets (5-15 mg total) by mouth every 3 (three) hours as needed for severe pain or breakthrough pain.     pravastatin 80 MG tablet  Commonly known as:  PRAVACHOL  TAKE 1 TABLET BY MOUTH EVERY MORNING     rivaroxaban 10 MG Tabs tablet  Commonly known as:  XARELTO  Take 1 tablet (10 mg total) by mouth daily with breakfast.     Tdap 05-13-13.5 LF-MCG/0.5 injection  Commonly known as:  ADACEL  Inject 0.5 mLs into the muscle once.            Follow-up Information    Follow up with [facility].   Why:  home health physical therapy   Contact information:   [place] [address] [phone]       Follow up with Tobi Bastos, MD On 04/23/2014.   Specialty:  Orthopedic Surgery   Contact information:   [address] [place] [address]  [phone]       Signed: Ardeen Jourdain, PA-C Orthopaedic Surgery 04/23/2014, 7:03 AM

## 2014-04-24 DIAGNOSIS — Z471 Aftercare following joint replacement surgery: Secondary | ICD-10-CM | POA: Diagnosis not present

## 2014-04-24 DIAGNOSIS — E119 Type 2 diabetes mellitus without complications: Secondary | ICD-10-CM | POA: Diagnosis not present

## 2014-04-24 DIAGNOSIS — E669 Obesity, unspecified: Secondary | ICD-10-CM | POA: Diagnosis not present

## 2014-04-24 DIAGNOSIS — Z96652 Presence of left artificial knee joint: Secondary | ICD-10-CM | POA: Diagnosis not present

## 2014-04-24 DIAGNOSIS — I10 Essential (primary) hypertension: Secondary | ICD-10-CM | POA: Diagnosis not present

## 2014-04-24 DIAGNOSIS — E785 Hyperlipidemia, unspecified: Secondary | ICD-10-CM | POA: Diagnosis not present

## 2014-04-25 DIAGNOSIS — E119 Type 2 diabetes mellitus without complications: Secondary | ICD-10-CM | POA: Diagnosis not present

## 2014-04-25 DIAGNOSIS — Z471 Aftercare following joint replacement surgery: Secondary | ICD-10-CM | POA: Diagnosis not present

## 2014-04-25 DIAGNOSIS — E785 Hyperlipidemia, unspecified: Secondary | ICD-10-CM | POA: Diagnosis not present

## 2014-04-25 DIAGNOSIS — E669 Obesity, unspecified: Secondary | ICD-10-CM | POA: Diagnosis not present

## 2014-04-25 DIAGNOSIS — I10 Essential (primary) hypertension: Secondary | ICD-10-CM | POA: Diagnosis not present

## 2014-04-25 DIAGNOSIS — Z96652 Presence of left artificial knee joint: Secondary | ICD-10-CM | POA: Diagnosis not present

## 2014-04-27 DIAGNOSIS — E669 Obesity, unspecified: Secondary | ICD-10-CM | POA: Diagnosis not present

## 2014-04-27 DIAGNOSIS — Z96652 Presence of left artificial knee joint: Secondary | ICD-10-CM | POA: Diagnosis not present

## 2014-04-27 DIAGNOSIS — E785 Hyperlipidemia, unspecified: Secondary | ICD-10-CM | POA: Diagnosis not present

## 2014-04-27 DIAGNOSIS — E119 Type 2 diabetes mellitus without complications: Secondary | ICD-10-CM | POA: Diagnosis not present

## 2014-04-27 DIAGNOSIS — I10 Essential (primary) hypertension: Secondary | ICD-10-CM | POA: Diagnosis not present

## 2014-04-27 DIAGNOSIS — Z471 Aftercare following joint replacement surgery: Secondary | ICD-10-CM | POA: Diagnosis not present

## 2014-04-30 DIAGNOSIS — Z471 Aftercare following joint replacement surgery: Secondary | ICD-10-CM | POA: Diagnosis not present

## 2014-04-30 DIAGNOSIS — M1712 Unilateral primary osteoarthritis, left knee: Secondary | ICD-10-CM | POA: Diagnosis not present

## 2014-04-30 DIAGNOSIS — Z96652 Presence of left artificial knee joint: Secondary | ICD-10-CM | POA: Diagnosis not present

## 2014-05-03 DIAGNOSIS — M1712 Unilateral primary osteoarthritis, left knee: Secondary | ICD-10-CM | POA: Diagnosis not present

## 2014-05-07 DIAGNOSIS — M1712 Unilateral primary osteoarthritis, left knee: Secondary | ICD-10-CM | POA: Diagnosis not present

## 2014-05-10 DIAGNOSIS — M1712 Unilateral primary osteoarthritis, left knee: Secondary | ICD-10-CM | POA: Diagnosis not present

## 2014-05-14 DIAGNOSIS — M1712 Unilateral primary osteoarthritis, left knee: Secondary | ICD-10-CM | POA: Diagnosis not present

## 2014-05-17 DIAGNOSIS — M1712 Unilateral primary osteoarthritis, left knee: Secondary | ICD-10-CM | POA: Diagnosis not present

## 2014-05-21 DIAGNOSIS — Z96652 Presence of left artificial knee joint: Secondary | ICD-10-CM | POA: Diagnosis not present

## 2014-05-21 DIAGNOSIS — Z471 Aftercare following joint replacement surgery: Secondary | ICD-10-CM | POA: Diagnosis not present

## 2014-05-22 DIAGNOSIS — M1712 Unilateral primary osteoarthritis, left knee: Secondary | ICD-10-CM | POA: Diagnosis not present

## 2014-05-24 DIAGNOSIS — M1712 Unilateral primary osteoarthritis, left knee: Secondary | ICD-10-CM | POA: Diagnosis not present

## 2014-05-28 DIAGNOSIS — M1712 Unilateral primary osteoarthritis, left knee: Secondary | ICD-10-CM | POA: Diagnosis not present

## 2014-05-31 DIAGNOSIS — M1712 Unilateral primary osteoarthritis, left knee: Secondary | ICD-10-CM | POA: Diagnosis not present

## 2014-06-04 DIAGNOSIS — M1712 Unilateral primary osteoarthritis, left knee: Secondary | ICD-10-CM | POA: Diagnosis not present

## 2014-06-08 DIAGNOSIS — M1712 Unilateral primary osteoarthritis, left knee: Secondary | ICD-10-CM | POA: Diagnosis not present

## 2014-06-12 DIAGNOSIS — M1712 Unilateral primary osteoarthritis, left knee: Secondary | ICD-10-CM | POA: Diagnosis not present

## 2014-06-14 DIAGNOSIS — M1712 Unilateral primary osteoarthritis, left knee: Secondary | ICD-10-CM | POA: Diagnosis not present

## 2014-06-15 DIAGNOSIS — Z96652 Presence of left artificial knee joint: Secondary | ICD-10-CM | POA: Diagnosis not present

## 2014-06-15 DIAGNOSIS — Z471 Aftercare following joint replacement surgery: Secondary | ICD-10-CM | POA: Diagnosis not present

## 2014-06-18 DIAGNOSIS — M1712 Unilateral primary osteoarthritis, left knee: Secondary | ICD-10-CM | POA: Diagnosis not present

## 2014-06-21 DIAGNOSIS — M1712 Unilateral primary osteoarthritis, left knee: Secondary | ICD-10-CM | POA: Diagnosis not present

## 2014-06-22 ENCOUNTER — Encounter: Payer: Self-pay | Admitting: Internal Medicine

## 2014-06-22 ENCOUNTER — Ambulatory Visit (INDEPENDENT_AMBULATORY_CARE_PROVIDER_SITE_OTHER): Payer: Medicare Other | Admitting: Internal Medicine

## 2014-06-22 VITALS — BP 128/64 | HR 93 | Temp 98.2°F | Resp 18 | Ht 66.0 in | Wt 186.6 lb

## 2014-06-22 DIAGNOSIS — E871 Hypo-osmolality and hyponatremia: Secondary | ICD-10-CM

## 2014-06-22 DIAGNOSIS — E785 Hyperlipidemia, unspecified: Secondary | ICD-10-CM

## 2014-06-22 DIAGNOSIS — I10 Essential (primary) hypertension: Secondary | ICD-10-CM

## 2014-06-22 DIAGNOSIS — E1142 Type 2 diabetes mellitus with diabetic polyneuropathy: Secondary | ICD-10-CM | POA: Diagnosis not present

## 2014-06-22 DIAGNOSIS — Z96652 Presence of left artificial knee joint: Secondary | ICD-10-CM

## 2014-06-22 DIAGNOSIS — Z720 Tobacco use: Secondary | ICD-10-CM

## 2014-06-22 DIAGNOSIS — E1169 Type 2 diabetes mellitus with other specified complication: Secondary | ICD-10-CM | POA: Diagnosis not present

## 2014-06-22 MED ORDER — PRAVASTATIN SODIUM 80 MG PO TABS: 80.0000 mg | ORAL_TABLET | Freq: Every morning | ORAL | Status: DC

## 2014-06-22 MED ORDER — METFORMIN HCL 500 MG PO TABS: ORAL_TABLET | ORAL | Status: DC

## 2014-06-22 MED ORDER — LISINOPRIL 5 MG PO TABS: 5.0000 mg | ORAL_TABLET | Freq: Every day | ORAL | Status: DC

## 2014-06-22 NOTE — Patient Instructions (Signed)
chantix

## 2014-06-22 NOTE — Progress Notes (Signed)
Patient ID: Alison [name], female   DOB: 16-Aug-1945, 69 y.o.   MRN: [id number]   Location:  [facility]  Code Status: full code Goals of Care: Advanced Directive information Does patient have an advance directive?: No, Would patient like information on creating an advanced directive?: Yes - Educational materials given (previously, but has not yet completed)   Chief Complaint  Patient presents with  . Medical Management of Chronic Issues    HPI: Patient is a 69 y.o. black female seen in the office today for med mgt of chronic diseases.    Had left total knee arthroplasty on 04/18/14 by Dr. Gladstone Lighter.  Left knee is doing well.  Still has quite a bit of swelling.  Went to outpatient therapy--last day was yesterday.  Pain improved a lot  Still bothers her on top of the knee and down the lateral part of her lower leg.  Pain med was 2x per day with hydrocodone/apap, but pain was still too much so back up to 3x per day.  Hydrocodone lasts her about 4 hrs.  Puts ice on it when it wears off.  Some trouble sleeping b/c sleeps in recliner.  Still trying to find a good spot in the bed b/c she is used to sleeping on her side in bed.  Can't do that in recliner so doesn't wake up with pain.  Had same problem with the other knee.  Due for her mammo and eye appts, but otherwise up to date.  Is on ACE so does not need urine microalbumin.  Review of Systems:  Review of Systems  Constitutional: Negative for fever and chills.  HENT: Negative for congestion.   Eyes: Negative for blurred vision.  Respiratory: Negative for shortness of breath.   Cardiovascular: Negative for chest pain and leg swelling.  Gastrointestinal: Negative for abdominal pain, constipation, blood in stool and melena.  Genitourinary: Negative for dysuria, urgency and frequency.  Musculoskeletal: Positive for joint pain. Negative for myalgias, back pain and falls.  Skin: Negative for rash.    Neurological: Negative for dizziness and loss of consciousness.  Endo/Heme/Allergies: Does not bruise/bleed easily.  Psychiatric/Behavioral: Negative for depression and memory loss.    Past Medical History  Diagnosis Date  . Unspecified vitamin D deficiency   . Obesity, unspecified   . Goiter, specified as simple   . Pernicious anemia   . Hyperlipidemia LDL goal < 100   . Unspecified glaucoma   . Benign essential hypertension   . Diverticulitis of colon (without mention of hemorrhage)   . Osteoarthrosis, unspecified whether generalized or localized, lower leg   . Osteopenia   . Numbness     TOES RT FOOT and toes left foot  . Diabetes mellitus without complication   . Other specified disease of nail     FUNGUS OF FINGERNAILS    Past Surgical History  Procedure Laterality Date  . Total knee arthroplasty Right 02/01/2013    Procedure: RIGHT TOTAL KNEE ARTHROPLASTY;  Surgeon: Tobi Bastos, MD;  Location: [facility];  Service: Orthopedics;  Laterality: Right;  . Colectomy  2009  . 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 Back surgery    . Joint replacement      right  . Total knee arthroplasty Left 04/18/2014  Procedure: LEFT TOTAL KNEE ARTHROPLASTY;  Surgeon: Latanya Maudlin, MD;  Location: [facility];  Service: Orthopedics;  Laterality: Left;    Allergies  Allergen Reactions  . Aspirin Nausea Only    Can take 81 mg but can't take 325mg    Medications: Patient's Medications  New Prescriptions   No medications on file  Previous Medications   CARBOXYMETHYLCELLULOSE (REFRESH PLUS) 0.5 % SOLN    Place 1 drop into both eyes 3 (three) times daily as needed (dry eyes).    DOCUSATE SODIUM (COLACE) 100 MG CAPSULE    Take 1 capsule (100 mg total) by mouth 2 (two) times daily.   FERROUS  SULFATE 325 (65 FE) MG TABLET    Take 1 tablet (325 mg total) by mouth 2 (two) times daily with a meal.   LATANOPROST (XALATAN) 0.005 % OPHTHALMIC SOLUTION    INSTILL 1 DROP IN BOTH EYES EVERY DAY AT BEDTIME   METHOCARBAMOL (ROBAXIN) 500 MG TABLET    Take 1 tablet (500 mg total) by mouth every 6 (six) hours as needed for muscle spasms.   OMEGA-3 ACID ETHYL ESTERS (LOVAZA) 1 G CAPSULE    Take 2 capsules (2 g total) by mouth daily.   OXYCODONE (OXY IR/ROXICODONE) 5 MG IMMEDIATE RELEASE TABLET    Take 1-3 tablets (5-15 mg total) by mouth every 3 (three) hours as needed for severe pain or breakthrough pain.   RIVAROXABAN (XARELTO) 10 MG TABS TABLET    Take 1 tablet (10 mg total) by mouth daily with breakfast.   TDAP (ADACEL) 05-13-13.5 LF-MCG/0.5 INJECTION    Inject 0.5 mLs into the muscle once.  Modified Medications   Modified Medication Previous Medication   LISINOPRIL (PRINIVIL,ZESTRIL) 5 MG TABLET lisinopril (PRINIVIL,ZESTRIL) 5 MG tablet      Take 1 tablet (5 mg total) by mouth daily.    Take 1 tablet (5 mg total) by mouth daily.   METFORMIN (GLUCOPHAGE) 500 MG TABLET metFORMIN (GLUCOPHAGE) 500 MG tablet      TAKE 1/2 TABLET BY MOUTH EVERY EVENING AT SUPPER    TAKE 1/2 TABLET BY MOUTH EVERY EVENING AT SUPPER   PRAVASTATIN (PRAVACHOL) 80 MG TABLET pravastatin (PRAVACHOL) 80 MG tablet      Take 1 tablet (80 mg total) by mouth every morning.    TAKE 1 TABLET BY MOUTH EVERY MORNING  Discontinued Medications   No medications on file    Physical Exam: Filed Vitals:   06/22/14 0935  BP: 128/64  Pulse: 93  Temp: 98.2 F (36.8 C)  TempSrc: Oral  Resp: 18  Height: 5\' 6"  (1.676 m)  Weight: 186 lb 9.6 oz (84.641 kg)  SpO2: 98%   Physical Exam  Constitutional: She is oriented to person, place, and time. She appears well-developed and well-nourished. No distress.  Cardiovascular: Normal rate, regular rhythm, normal heart sounds and intact distal pulses.   Pulmonary/Chest: Effort normal.  Few  coarse rhonchi  Abdominal: Soft. Bowel sounds are normal.  Musculoskeletal: Normal range of motion. She exhibits tenderness.  Swelling, warmth and tenderness over left knee especially just proximal to patella and on lateral aspect of shin  Neurological: She is alert and oriented to person, place, and time.  Skin: Skin is warm and dry.  Psychiatric: She has a normal mood and affect. Her behavior is normal. Judgment and thought content normal.    Labs reviewed: Basic Metabolic Panel:  Recent Labs  08/28/13 1044 04/11/14 1200 04/19/14 0425 04/20/14 0448  NA 140 138 138 129*  K  4.4 4.4 4.2 3.7  CL 102 102 102 95*  CO2 24 27 27 27   GLUCOSE 83 71 137* 147*  BUN 12 13 12 10   CREATININE 0.79 0.73 0.80 0.68  CALCIUM 9.8 9.5 9.1 8.6  TSH 1.170  --   --   --    Liver Function Tests:  Recent Labs  08/28/13 1044 04/11/14 1200  AST 18 33  ALT 16 20  ALKPHOS 77 59  BILITOT 0.3 1.0  PROT 7.0 7.9  ALBUMIN  --  4.3   No results for input(s): LIPASE, AMYLASE in the last 8760 hours. No results for input(s): AMMONIA in the last 8760 hours. CBC:  Recent Labs  08/28/13 1044 04/11/14 1200 04/19/14 0425 04/20/14 0448  WBC 9.6 10.5 16.7* 18.4*  NEUTROABS 6.4 7.3  --   --   HGB 13.0 12.9 10.5* 10.4*  HCT 39.7 39.2 32.1* 31.3*  MCV 85 89.3 88.9 87.4  PLT 375 301 291 265   Lipid Panel:  Recent Labs  03/19/14 0843  CHOL 150  HDL 39*  LDLCALC 94  TRIG 86  CHOLHDL 3.8   Lab Results  Component Value Date   HGBA1C 5.9* 03/19/2014    Procedures since last visit: Reviewed hospital records from admission for left knee replacement  Assessment/Plan 1. Diabetic polyneuropathy associated with type 2 diabetes mellitus - not currently on medication for the neuropathy, had no related concerns today, monitor   2. Type 2 diabetes mellitus with diabetic polyneuropathy -f/u hba1c today -is on lisinopril, as well which was renewed, cont metformin - metFORMIN (GLUCOPHAGE) 500 MG  tablet; TAKE 1/2 TABLET BY MOUTH EVERY EVENING AT SUPPER  Dispense: 90 tablet; Refill: 1  3. Hyponatremia -last lab in system shows Na of 129 so will reassess today, but seems cognitively normal and doing fine so suspect this normalized and it was probably from her fluids during surgery  4. Status post total left knee replacement -by Dr. Gladstone Lighter in April -doing well, still has some swelling and requiring q 8 hr hydrocodone but not overusing--using ice between with benefit -just completed PT yesterday -no longer needing muscle relaxer  5. Hyperlipidemia associated with type 2 diabetes mellitus -cont pravachol 80mg  daily  6. Tobacco abuse -discussed smoking cessation in more detail today -recommended trying chantix therapy b/c she does not have any problems with anxiety or depression  7. Benign essential hypertension -bp at goal with current lisinopril  Labs/tests ordered:  Orders Placed This Encounter  Procedures  . Basic metabolic panel  . Hemoglobin A1c    Next appt:  3 mos  Jerral Mccauley L. Tobias Avitabile, D.O. [facility] [address], [place] [address] Cell Phone (Mon-Fri 8am-5pm):  [phone] On Call:  [phone] & follow prompts after 5pm & weekends Office Phone:  [phone] Office Fax:  [phone]

## 2014-06-23 LAB — BASIC METABOLIC PANEL
BUN/Creatinine Ratio: 17 (ref 11–26)
BUN: 12 mg/dL (ref 8–27)
CO2: 25 mmol/L (ref 18–29)
Calcium: 10.1 mg/dL (ref 8.7–10.3)
Chloride: 100 mmol/L (ref 97–108)
Creatinine, Ser: 0.71 mg/dL (ref 0.57–1.00)
GFR calc Af Amer: 100 mL/min/{1.73_m2} (ref >59)
GFR calc non Af Amer: 87 mL/min/{1.73_m2} (ref >59)
Glucose: 86 mg/dL (ref 65–99)
Potassium: 4.7 mmol/L (ref 3.5–5.2)
Sodium: 140 mmol/L (ref 134–144)

## 2014-06-23 LAB — HEMOGLOBIN A1C
Est. average glucose Bld gHb Est-mCnc: 128 mg/dL
Hgb A1c MFr Bld: 6.1 % — ABNORMAL HIGH (ref 4.8–5.6)

## 2014-07-17 DIAGNOSIS — H18413 Arcus senilis, bilateral: Secondary | ICD-10-CM | POA: Diagnosis not present

## 2014-07-17 DIAGNOSIS — H04123 Dry eye syndrome of bilateral lacrimal glands: Secondary | ICD-10-CM | POA: Diagnosis not present

## 2014-07-17 DIAGNOSIS — H11153 Pinguecula, bilateral: Secondary | ICD-10-CM | POA: Diagnosis not present

## 2014-07-17 DIAGNOSIS — H2513 Age-related nuclear cataract, bilateral: Secondary | ICD-10-CM | POA: Diagnosis not present

## 2014-07-17 DIAGNOSIS — H4011X2 Primary open-angle glaucoma, moderate stage: Secondary | ICD-10-CM | POA: Diagnosis not present

## 2014-07-27 ENCOUNTER — Ambulatory Visit (INDEPENDENT_AMBULATORY_CARE_PROVIDER_SITE_OTHER): Payer: Medicare Other | Admitting: Emergency Medicine

## 2014-07-27 ENCOUNTER — Ambulatory Visit (INDEPENDENT_AMBULATORY_CARE_PROVIDER_SITE_OTHER): Payer: Medicare Other

## 2014-07-27 ENCOUNTER — Encounter: Payer: Self-pay | Admitting: Emergency Medicine

## 2014-07-27 VITALS — BP 165/80 | HR 102 | Temp 97.7°F | Resp 18

## 2014-07-27 DIAGNOSIS — E119 Type 2 diabetes mellitus without complications: Secondary | ICD-10-CM | POA: Diagnosis not present

## 2014-07-27 DIAGNOSIS — Z720 Tobacco use: Secondary | ICD-10-CM

## 2014-07-27 DIAGNOSIS — R1013 Epigastric pain: Secondary | ICD-10-CM

## 2014-07-27 DIAGNOSIS — I10 Essential (primary) hypertension: Secondary | ICD-10-CM

## 2014-07-27 DIAGNOSIS — R079 Chest pain, unspecified: Secondary | ICD-10-CM

## 2014-07-27 DIAGNOSIS — F17200 Nicotine dependence, unspecified, uncomplicated: Secondary | ICD-10-CM

## 2014-07-27 LAB — POCT CBC
Granulocyte percent: 75.7 %G (ref 37–80)
HCT, POC: 36.5 % — AB (ref 37.7–47.9)
Hemoglobin: 12.1 g/dL — AB (ref 12.2–16.2)
Lymph, poc: 2.4 (ref 0.6–3.4)
MCH, POC: 26.7 pg — AB (ref 27–31.2)
MCHC: 33.1 g/dL (ref 31.8–35.4)
MCV: 80.6 fL (ref 80–97)
MID (cbc): 1 — AB (ref 0–0.9)
MPV: 7.1 fL (ref 0–99.8)
POC Granulocyte: 10.5 — AB (ref 2–6.9)
POC LYMPH %: 17.3 % (ref 10–50)
POC MID %: 7 % (ref 0–12)
Platelet Count, POC: 375 10*3/uL (ref 142–424)
RBC: 4.52 M/uL (ref 4.04–5.48)
RDW, POC: 16.2 %
WBC: 13.9 10*3/uL — AB (ref 4.6–10.2)

## 2014-07-27 LAB — COMPREHENSIVE METABOLIC PANEL
ALT: 14 U/L (ref 0–35)
AST: 16 U/L (ref 0–37)
Albumin: 4.3 g/dL (ref 3.5–5.2)
Alkaline Phosphatase: 75 U/L (ref 39–117)
BILIRUBIN TOTAL: 0.3 mg/dL (ref 0.2–1.2)
BUN: 7 mg/dL (ref 6–23)
CO2: 26 meq/L (ref 19–32)
Calcium: 9.7 mg/dL (ref 8.4–10.5)
Chloride: 103 mEq/L (ref 96–112)
Creat: 0.69 mg/dL (ref 0.50–1.10)
Glucose, Bld: 101 mg/dL — ABNORMAL HIGH (ref 70–99)
POTASSIUM: 4.1 meq/L (ref 3.5–5.3)
Sodium: 137 mEq/L (ref 135–145)
TOTAL PROTEIN: 7.5 g/dL (ref 6.0–8.3)

## 2014-07-27 LAB — AMYLASE: AMYLASE: 54 U/L (ref 0–105)

## 2014-07-27 LAB — LIPASE: LIPASE: 28 U/L (ref 0–75)

## 2014-07-27 MED ORDER — LANSOPRAZOLE 30 MG PO CPDR: 30.0000 mg | DELAYED_RELEASE_CAPSULE | Freq: Every day | ORAL | Status: DC

## 2014-07-27 NOTE — Progress Notes (Signed)
Subjective:  Patient ID: Alison [name], female    DOB: 10/15/1945  Age: 69 y.o. MRN: [id number]  CC: Chest Pain and Nausea   HPI Alison [name] presents  with epigastric pain started last night. She has no nausea vomiting. Has no dyspepsia. No symptoms suggestive of reflux esophagitis. She has no fever chills. She does not drink excess caffeine or alcohol. She does not use excess non-steroidal anti-inflammatory or aspirin. She is a smoker. She claims a history of total colectomy with his surgery sparing her rectum. He had to move her bowels but has been unsuccessful last couple days. She is admitted no blood in her stools. No black stools. No improvement with over-the-counter medication.  History Alison [name] has a past medical history of Unspecified vitamin D deficiency; Obesity, unspecified; Goiter, specified as simple; Pernicious anemia; Hyperlipidemia LDL goal < 100; Unspecified glaucoma; Benign essential hypertension; Diverticulitis of colon (without mention of hemorrhage); Osteoarthrosis, unspecified whether generalized or localized, lower leg; Osteopenia; Numbness; Diabetes mellitus without complication; and Other specified disease of nail.   She has past surgical history that includes Total knee arthroplasty (Right, 02/01/2013); Colectomy (2009); Lumbar laminectomy/decompression microdiscectomy (N/A, 03/30/2013); Back surgery; Joint replacement; and Total knee arthroplasty (Left, 04/18/2014).   Her  family history includes Diabetes in her sister.  She   reports that she has been smoking.  She has never used smokeless tobacco. She reports that she does not drink alcohol or use illicit drugs.  Outpatient Prescriptions Prior to Visit  Medication Sig Dispense Refill  . docusate sodium (COLACE) 100 MG capsule Take 1 capsule (100 mg total) by mouth 2 (two) times daily. 20 capsule 0  . latanoprost (XALATAN) 0.005 % ophthalmic solution INSTILL 1 DROP IN BOTH EYES EVERY DAY AT BEDTIME 2.5 mL 1  .  lisinopril (PRINIVIL,ZESTRIL) 5 MG tablet Take 1 tablet (5 mg total) by mouth daily. 90 tablet 1  . metFORMIN (GLUCOPHAGE) 500 MG tablet TAKE 1/2 TABLET BY MOUTH EVERY EVENING AT SUPPER 90 tablet 1  . omega-3 acid ethyl esters (LOVAZA) 1 G capsule Take 2 capsules (2 g total) by mouth daily. (Patient taking differently: Take 1 g by mouth daily. ) 180 capsule 0  . pravastatin (PRAVACHOL) 80 MG tablet Take 1 tablet (80 mg total) by mouth every morning. 90 tablet 1  . Tdap (ADACEL) 05-13-13.5 LF-MCG/0.5 injection Inject 0.5 mLs into the muscle once. 0.5 mL 0  . carboxymethylcellulose (REFRESH PLUS) 0.5 % SOLN Place 1 drop into both eyes 3 (three) times daily as needed (dry eyes).     . ferrous sulfate 325 (65 FE) MG tablet Take 1 tablet (325 mg total) by mouth 2 (two) times daily with a meal. (Patient not taking: Reported on 07/27/2014) 180 tablet 0  . oxyCODONE (OXY IR/ROXICODONE) 5 MG immediate release tablet Take 1-3 tablets (5-15 mg total) by mouth every 3 (three) hours as needed for severe pain or breakthrough pain. (Patient not taking: Reported on 07/27/2014) 90 tablet 0   No facility-administered medications prior to visit.    History   Social History  . Marital Status: Single    Spouse Name: N/A  . Number of Children: N/A  . Years of Education: N/A   Social History Main Topics  . Smoking status: Current Every Day Smoker -- 0.50 packs/day for 30 years  . Smokeless tobacco: Never Used  . Alcohol Use: No  . Drug Use: No  . Sexual Activity: Not on file   Other Topics Concern  . None  Social History Narrative     Review of Systems  Objective:  BP 165/80 mmHg  Pulse 102  Temp(Src) 97.7 F (36.5 C) (Oral)  Resp 18  SpO2 98%  Physical Exam    Assessment & Plan:   Alison [name] was seen today for chest pain and nausea.  Diagnoses and all orders for this visit:  Epigastric pain Orders: -     EKG 12-Lead -     DG Abd Acute W/Chest -     POCT CBC -     Comprehensive  metabolic panel -     Amylase -     Lipase  Benign essential hypertension Orders: -     DG Abd Acute W/Chest -     POCT CBC -     Comprehensive metabolic panel -     Amylase -     Lipase  Type 2 diabetes mellitus without complication Orders: -     DG Abd Acute W/Chest -     POCT CBC -     Comprehensive metabolic panel -     Amylase -     Lipase  Tobacco use disorder Orders: -     DG Abd Acute W/Chest -     POCT CBC -     Comprehensive metabolic panel -     Amylase -     Lipase  Other orders -     lansoprazole (PREVACID) 30 MG capsule; Take 1 capsule (30 mg total) by mouth daily at 12 noon.   I am having Alison [name] start on lansoprazole. I am also having her maintain her carboxymethylcellulose, latanoprost, Tdap, omega-3 acid ethyl esters, ferrous sulfate, oxyCODONE, docusate sodium, pravastatin, lisinopril, and metFORMIN.  Meds ordered this encounter  Medications  . lansoprazole (PREVACID) 30 MG capsule    Sig: Take 1 capsule (30 mg total) by mouth daily at 12 noon.    Dispense:  30 capsule    Refill:  0    Appropriate red flag conditions were discussed with the patient as well as actions that should be taken.  Patient expressed his understanding.  Follow-up: Return if symptoms worsen or fail to improve.  Roselee Culver, MD   Results for orders placed or performed in visit on 07/27/14  POCT CBC  Result Value Ref Range   WBC 13.9 (A) 4.6 - 10.2 K/uL   Lymph, poc 2.4 0.6 - 3.4   POC LYMPH PERCENT 17.3 10 - 50 %L   MID (cbc) 1.0 (A) 0 - 0.9   POC MID % 7.0 0 - 12 %M   POC Granulocyte 10.5 (A) 2 - 6.9   Granulocyte percent 75.7 37 - 80 %G   RBC 4.52 4.04 - 5.48 M/uL   Hemoglobin 12.1 (A) 12.2 - 16.2 g/dL   HCT, POC 36.5 (A) 37.7 - 47.9 %   MCV 80.6 80 - 97 fL   MCH, POC 26.7 (A) 27 - 31.2 pg   MCHC 33.1 31.8 - 35.4 g/dL   RDW, POC 16.2 %   Platelet Count, POC 375 142 - 424 K/uL   MPV 7.1 0 - 99.8 fL    [facility] reading (PRIMARY) by  Dr. Ouida Sills.  No  obstruction or free air.

## 2014-08-02 DIAGNOSIS — Z471 Aftercare following joint replacement surgery: Secondary | ICD-10-CM | POA: Diagnosis not present

## 2014-08-02 DIAGNOSIS — Z96652 Presence of left artificial knee joint: Secondary | ICD-10-CM | POA: Diagnosis not present

## 2014-08-22 ENCOUNTER — Ambulatory Visit (INDEPENDENT_AMBULATORY_CARE_PROVIDER_SITE_OTHER): Payer: Medicare Other | Admitting: Family Medicine

## 2014-08-22 VITALS — BP 130/82 | HR 100 | Temp 98.7°F | Resp 16 | Ht 66.0 in | Wt 192.0 lb

## 2014-08-22 DIAGNOSIS — M545 Low back pain, unspecified: Secondary | ICD-10-CM

## 2014-08-22 MED ORDER — HYDROCODONE-ACETAMINOPHEN 5-325 MG PO TABS: 1.0000 | ORAL_TABLET | Freq: Three times a day (TID) | ORAL | Status: DC | PRN

## 2014-08-22 NOTE — Patient Instructions (Signed)
Your back pain has flared up- I hope that it settle back down quickly Use the hydrocodone and tylenol as needed for pain You can also try heat or ice on your back The pain medication will make you sleepy so do not use it when you need to drive Let me know if you do not feel better soon!

## 2014-08-22 NOTE — Progress Notes (Signed)
[facility] [address], [place] [address] [phone]  Date:  08/22/2014   Name:  Alison [name]   DOB:  [birth year]   MRN:  [id number]  PCP:  Hollace Kinnier, DO    Chief Complaint: Back Pain   History of Present Illness:  Alison [name] is a 69 y.o. very pleasant female patient who presents with the following:  Pt with history of back problems as below, smoking, DM. Here today with complaint of back pain.  She did have a back operation last year and has done relatively well with her back since.  She noted pain in her back yesterday- she tried changing her shoes and took 2 tyelnol but has not gotten better yet.   She notes that her knees do hurt some, but she is s/p bilateral knee replacement She has not fallen or otherwise had any injury She does not notice any numbness or weakness in her legs, no bowel or bladder incont She does help her mother who is disabled.  Has to help her with standing, etc which is harder now that her back is bad She recently used hyddrocodone after her knee replacement without difficulty  Patient Active Problem List   Diagnosis Date Noted  . Postoperative anemia due to acute blood loss 04/20/2014  . History of total knee arthroplasty 04/18/2014  . History of lumbar laminectomy for spinal cord decompression 05/04/2013  . Synovial cyst of lumbar spine 03/31/2013  . Spinal stenosis, lumbar region, with neurogenic claudication 03/30/2013  . Synovial cyst of lumbar facet joint 03/30/2013  . Type II diabetes mellitus 02/26/2013  . Type II or unspecified type diabetes mellitus without mention of complication, uncontrolled 02/08/2013  . Acute blood loss anemia 02/02/2013  . Osteoarthritis of right knee 02/01/2013  . History of total knee replacement 02/01/2013  . Tobacco abuse 10/03/2012  . Hyperglycemia 10/03/2012  . Hyperlipidemia LDL goal < 100   . Unspecified vitamin D deficiency   . Obesity, unspecified   . Tobacco use  disorder   . Pernicious anemia   . Unspecified glaucoma   . Benign essential hypertension   . Osteoarthrosis, unspecified whether generalized or localized, lower leg   . Osteopenia     Past Medical History  Diagnosis Date  . Unspecified vitamin D deficiency   . Obesity, unspecified   . Goiter, specified as simple   . Pernicious anemia   . Hyperlipidemia LDL goal < 100   . Unspecified glaucoma   . Benign essential hypertension   . Diverticulitis of colon (without mention of hemorrhage)   . Osteoarthrosis, unspecified whether generalized or localized, lower leg   . Osteopenia   . Numbness     TOES RT FOOT and toes left foot  . Diabetes mellitus without complication   . Other specified disease of nail     FUNGUS OF FINGERNAILS    Past Surgical History  Procedure Laterality Date  . Total knee arthroplasty Right 02/01/2013    Procedure: RIGHT TOTAL KNEE ARTHROPLASTY;  Surgeon: Tobi Bastos, MD;  Location: [facility];  Service: Orthopedics;  Laterality: Right;  . Colectomy  2009  . 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 Back surgery    . Joint replacement      right  . Total knee  arthroplasty Left 04/18/2014    Procedure: LEFT TOTAL KNEE ARTHROPLASTY;  Surgeon: Latanya Maudlin, MD;  Location: [facility];  Service: Orthopedics;  Laterality: Left;    Social History  Substance Use Topics  . Smoking status: Current Every Day Smoker -- 0.50 packs/day for 30 years  . Smokeless tobacco: Never Used  . Alcohol Use: No    Family History  Problem Relation Age of Onset  . Diabetes Sister     Allergies  Allergen Reactions  . Aspirin Nausea Only    Can take 81 mg but can't take 325mg     Medication list has been reviewed and updated.  Current Outpatient Prescriptions on File Prior  to Visit  Medication Sig Dispense Refill  . carboxymethylcellulose (REFRESH PLUS) 0.5 % SOLN Place 1 drop into both eyes 3 (three) times daily as needed (dry eyes).     . ferrous sulfate 325 (65 FE) MG tablet Take 1 tablet (325 mg total) by mouth 2 (two) times daily with a meal. 180 tablet 0  . latanoprost (XALATAN) 0.005 % ophthalmic solution INSTILL 1 DROP IN BOTH EYES EVERY DAY AT BEDTIME 2.5 mL 1  . lisinopril (PRINIVIL,ZESTRIL) 5 MG tablet Take 1 tablet (5 mg total) by mouth daily. 90 tablet 1  . metFORMIN (GLUCOPHAGE) 500 MG tablet TAKE 1/2 TABLET BY MOUTH EVERY EVENING AT SUPPER 90 tablet 1  . omega-3 acid ethyl esters (LOVAZA) 1 G capsule Take 2 capsules (2 g total) by mouth daily. (Patient taking differently: Take 1 g by mouth daily. ) 180 capsule 0   No current facility-administered medications on file prior to visit.    Review of Systems:  As per HPI- otherwise negative.   Physical Examination: Filed Vitals:   08/22/14 0849  BP: 130/82  Pulse: 100  Temp: 98.7 F (37.1 C)  Resp: 16   Filed Vitals:   08/22/14 0849  Height: 5\' 6"  (1.676 m)  Weight: 192 lb (87.091 kg)   Body mass index is 31 kg/(m^2). Ideal Body Weight: Weight in (lb) to have BMI = 25: 154.6  GEN: WDWN, NAD, Non-toxic, A & O x 3, overweight, looks well HEENT: Atraumatic, Normocephalic. Neck supple. No masses, No LAD. Ears and Nose: No external deformity. CV: RRR, No M/G/R. No JVD. No thrill. No extra heart sounds. PULM: CTA B, no wheezes, crackles, rhonchi. No retractions. No resp. distress. No accessory muscle use. EXTR: No c/c/e. Bilateral knee replacement scars Normal strength, sensation of BLE, negative SLR NEURO using a walker- she does not normally use this but is using it foday for her pain. Slow gait PSYCH: Normally interactive. Conversant. Not depressed or anxious appearing.  Calm demeanor.  She has a midline back scar from operation Noted tenderness in the right lower back/ over right SI  joint. Flexion and extension limited by pain  Assessment and Plan: Right-sided low back pain without sciatica - Plan: HYDROcodone-acetaminophen (NORCO/VICODIN) 5-325 MG per tablet  Treat with a small supply of hydrocodone Follow-up if not better soon  Signed Lamar Blinks, MD

## 2014-08-23 ENCOUNTER — Telehealth: Payer: Self-pay | Admitting: Family Medicine

## 2014-08-23 ENCOUNTER — Telehealth: Payer: Self-pay

## 2014-08-23 DIAGNOSIS — M5136 Other intervertebral disc degeneration, lumbar region: Secondary | ICD-10-CM | POA: Diagnosis not present

## 2014-08-23 NOTE — Telephone Encounter (Signed)
Left message to RTC.

## 2014-08-23 NOTE — Telephone Encounter (Signed)
Patient states that the Hydrocodone she was prescribed is not working well. What can she do?  [phone]

## 2014-08-23 NOTE — Telephone Encounter (Signed)
Pt was returning call to let Dr.Copland know that she has an appointment with her other doctor that she will be seeing. She also wanted to thank her for responding back in a timely matter.

## 2014-08-23 NOTE — Telephone Encounter (Signed)
RTC

## 2014-08-23 NOTE — Telephone Encounter (Signed)
Yes RTC please. Philis Fendt, MS, PA-C   2:52 PM, 08/23/2014

## 2014-09-06 DIAGNOSIS — M5136 Other intervertebral disc degeneration, lumbar region: Secondary | ICD-10-CM | POA: Diagnosis not present

## 2014-09-21 ENCOUNTER — Encounter: Payer: Self-pay | Admitting: Internal Medicine

## 2014-09-21 ENCOUNTER — Ambulatory Visit (INDEPENDENT_AMBULATORY_CARE_PROVIDER_SITE_OTHER): Payer: Medicare Other | Admitting: Internal Medicine

## 2014-09-21 VITALS — BP 124/86 | HR 98 | Temp 98.0°F | Resp 16 | Ht 66.0 in | Wt 192.0 lb

## 2014-09-21 DIAGNOSIS — M5441 Lumbago with sciatica, right side: Secondary | ICD-10-CM | POA: Diagnosis not present

## 2014-09-21 DIAGNOSIS — E1142 Type 2 diabetes mellitus with diabetic polyneuropathy: Secondary | ICD-10-CM | POA: Diagnosis not present

## 2014-09-21 DIAGNOSIS — Z720 Tobacco use: Secondary | ICD-10-CM | POA: Diagnosis not present

## 2014-09-21 DIAGNOSIS — Z23 Encounter for immunization: Secondary | ICD-10-CM | POA: Diagnosis not present

## 2014-09-21 DIAGNOSIS — D2239 Melanocytic nevi of other parts of face: Secondary | ICD-10-CM

## 2014-09-21 DIAGNOSIS — E1169 Type 2 diabetes mellitus with other specified complication: Secondary | ICD-10-CM

## 2014-09-21 DIAGNOSIS — E785 Hyperlipidemia, unspecified: Secondary | ICD-10-CM | POA: Diagnosis not present

## 2014-09-21 DIAGNOSIS — I10 Essential (primary) hypertension: Secondary | ICD-10-CM | POA: Diagnosis not present

## 2014-09-21 DIAGNOSIS — D2230 Melanocytic nevi of unspecified part of face: Secondary | ICD-10-CM

## 2014-09-21 NOTE — Progress Notes (Signed)
Patient ID: Alison [name], female   DOB: August 11, 1945, 69 y.o.   MRN: [id number]   Location:  [facility]  Code Status: full code Goals of Care: Advanced Directive information Does patient have an advance directive?: No, Would patient like information on creating an advanced directive?: Yes - Educational materials given (Patient still has at home for completion )   Chief Complaint  Patient presents with  . Medical Management of Chronic Issues    3 month follow-up, not fasting   . Nevus    Examine bothersome mole on left side of face   . Immunizations    Discuss if ok to get flu vaccine with health issues     HPI: Patient is a 69 y.o. black female seen in the office today for med mgt of chronic diseases and concern about a mole on the left side of her face.    Since June, went to urgent care with epigastric pain--was GERD fortunately.  Then went again for a backache with right sciatica.  Ended up calling Dr. Gladstone Lighter who put her on prednisone.  Has now been on almost a month.  Has taken two 12 day courses and still has the same pain.  Took her last pill this am.  Can sort of move around, but later in the day, it's bad.  When sitting, she is fine, but as soon as she moves, it starts in her back and goes down the muscle in her right leg.  No right foot drop.  Has pain "in butt".    Tobacco abuse:  Continues to smoke about the same amount.    Is eating a whole lot with the steroids.   Not getting exercise.  HTN:  bp is well controlled at present.  No dizziness.    Review of Systems:  Review of Systems  Constitutional: Negative for fever, chills and malaise/fatigue.  HENT: Negative for congestion and hearing loss.   Eyes: Negative for blurred vision.       Glasses  Respiratory: Negative for shortness of breath and wheezing.   Cardiovascular: Negative for chest pain and leg swelling.  Gastrointestinal: Negative for abdominal pain, constipation,  blood in stool and melena.  Genitourinary: Negative for dysuria.  Musculoskeletal: Positive for back pain and falls.       S/p bilateral tkas now  Skin: Negative for rash.  Neurological: Negative for dizziness, loss of consciousness, weakness and headaches.       No foot drop  Endo/Heme/Allergies: Does not bruise/bleed easily.  Psychiatric/Behavioral: Negative for depression and memory loss.    Past Medical History  Diagnosis Date  . Unspecified vitamin D deficiency   . Obesity, unspecified   . Goiter, specified as simple   . Pernicious anemia   . Hyperlipidemia LDL goal < 100   . Unspecified glaucoma   . Benign essential hypertension   . Diverticulitis of colon (without mention of hemorrhage)   . Osteoarthrosis, unspecified whether generalized or localized, lower leg   . Osteopenia   . Numbness     TOES RT FOOT and toes left foot  . Diabetes mellitus without complication   . Other specified disease of nail     FUNGUS OF FINGERNAILS    Past Surgical History  Procedure Laterality Date  . Total knee arthroplasty Right 02/01/2013    Procedure: RIGHT TOTAL KNEE ARTHROPLASTY;  Surgeon: Tobi Bastos, MD;  Location: [facility];  Service: Orthopedics;  Laterality: Right;  . Colectomy  2009  . 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 Back surgery    . Joint replacement      right  . Total knee arthroplasty Left 04/18/2014    Procedure: LEFT TOTAL KNEE ARTHROPLASTY;  Surgeon: Latanya Maudlin, MD;  Location: [facility];  Service: Orthopedics;  Laterality: Left;    Allergies  Allergen Reactions  . Aspirin Nausea Only    Can take 81 mg but can't take 325mg    Medications: Patient's Medications  New Prescriptions   No medications on file  Previous Medications   ACETAMINOPHEN (TYLENOL)  500 MG TABLET    Take 500 mg by mouth 3 (three) times daily.   ASPIRIN EC 81 MG TABLET    Take 81 mg by mouth daily.   CARBOXYMETHYLCELLULOSE (REFRESH PLUS) 0.5 % SOLN    Place 1 drop into both eyes 3 (three) times daily as needed (dry eyes).    CHOLECALCIFEROL (VITAMIN D-3) 1000 UNITS CAPS    Take by mouth daily.   FERROUS SULFATE 325 (65 FE) MG TABLET    Take 1 tablet (325 mg total) by mouth 2 (two) times daily with a meal.   LATANOPROST (XALATAN) 0.005 % OPHTHALMIC SOLUTION    INSTILL 1 DROP IN BOTH EYES EVERY DAY AT BEDTIME   LISINOPRIL (PRINIVIL,ZESTRIL) 5 MG TABLET    Take 1 tablet (5 mg total) by mouth daily.   METFORMIN (GLUCOPHAGE) 500 MG TABLET    TAKE 1/2 TABLET BY MOUTH EVERY EVENING AT SUPPER   MULTIPLE VITAMIN (MULTIVITAMIN) TABLET    Take 1 tablet by mouth daily.   OMEGA-3 ACID ETHYL ESTERS (LOVAZA) 1 G CAPSULE    Take 2 capsules (2 g total) by mouth daily.   PRAVASTATIN (PRAVACHOL) 80 MG TABLET    Take 80 mg by mouth at bedtime. 1 by mouth daily for high cholesterol   PREDNISONE (DELTASONE) 5 MG TABLET    Take 5 mg by mouth daily. 1 by mouth daily  Modified Medications   No medications on file  Discontinued Medications   HYDROCODONE-ACETAMINOPHEN (NORCO/VICODIN) 5-325 MG PER TABLET    Take 1 tablet by mouth every 8 (eight) hours as needed.    Physical Exam: Filed Vitals:   09/21/14 0955  BP: 124/86  Pulse: 98  Temp: 98 F (36.7 C)  TempSrc: Oral  Resp: 16  Height: 5\' 6"  (1.676 m)  Weight: 192 lb (87.091 kg)  SpO2: 97%   Physical Exam  Constitutional: She is oriented to person, place, and time. She appears well-developed and well-nourished. No distress.  Eyes:  glasses  Cardiovascular: Normal rate, regular rhythm, normal heart sounds and intact distal pulses.   Pulmonary/Chest: Effort normal and breath sounds normal. No respiratory distress.  Abdominal: Soft. Bowel sounds are normal. She exhibits no distension. There is no tenderness.  Musculoskeletal: She  exhibits tenderness. She exhibits no edema.  Walking with limp with right leg; scars from bilateral tkas; right sided tenderness in lower lumbar region and into right buttock  Neurological: She is alert and oriented to person, place, and time.  Skin: Skin is warm and dry.  Small cutaneous horn-like nevus on left cheek that is irritated; multiple other small raised nevi on bilateral cheeks and beneath eyes    Labs reviewed: Basic Metabolic Panel:  Recent Labs  04/20/14  0448 06/22/14 1017 07/27/14 1117  NA 129* 140 137  K 3.7 4.7 4.1  CL 95* 100 103  CO2 27 25 26   GLUCOSE 147* 86 101*  BUN 10 12 7   CREATININE 0.68 0.71 0.69  CALCIUM 8.6 10.1 9.7   Liver Function Tests:  Recent Labs  04/11/14 1200 07/27/14 1117  AST 33 16  ALT 20 14  ALKPHOS 59 75  BILITOT 1.0 0.3  PROT 7.9 7.5  ALBUMIN 4.3 4.3    Recent Labs  07/27/14 1117  LIPASE 28  AMYLASE 54   No results for input(s): AMMONIA in the last 8760 hours. CBC:  Recent Labs  04/11/14 1200 04/19/14 0425 04/20/14 0448 07/27/14 1131  WBC 10.5 16.7* 18.4* 13.9*  NEUTROABS 7.3  --   --   --   HGB 12.9 10.5* 10.4* 12.1*  HCT 39.2 32.1* 31.3* 36.5*  MCV 89.3 88.9 87.4 80.6  PLT 301 291 265  --    Lipid Panel:  Recent Labs  03/19/14 0843  CHOL 150  HDL 39*  LDLCALC 94  TRIG 86  CHOLHDL 3.8   Lab Results  Component Value Date   HGBA1C 6.1* 06/22/2014   Assessment/Plan 1. Irritated nevus of face -recommended she have this removed by dermatology or plastic surgery due to location--extremely small and could almost be tied off  2. Right-sided low back pain with right-sided sciatica - f/u with Dr. 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 Type 2 diabetes mellitus with diabetic polyneuropathy -hba1c was already worsening in June and now had two prednisone tapers so anticipate it's well into the diabetic range now and her back is too painful to exercise significantly; also has been eating a lot from the steroids - Hemoglobin A1c - Comprehensive metabolic panel - CBC with Differential/Platelet  4. Tobacco abuse -has resumed smoking and thinks she's smoking more b/c she's been less active and smokes when sitting -encouraged her to do something else like read or find a hobby she can do when sitting  5. Benign essential hypertension - bp well controlled with current therapy even with steroids and smoking - Comprehensive metabolic panel  6. Hyperlipidemia associated with type 2 diabetes mellitus - lipids have been under fair control with pravachol 80mg , but ideally needs a stronger statin to get lipids under 70 -pravachol had come off of her list altogether when she came in today and unclear why that was--she says she is still taking and it has not been checked since--recheck before next appt.  7. Need for prophylactic vaccination and inoculation against influenza -flu shot was given today  Labs/tests ordered:   Orders Placed This Encounter  Procedures  . Flu Vaccine QUAD 36+ mos PF IM (Fluarix & Fluzone Quad PF)  . Hemoglobin A1c  . Comprehensive metabolic panel  . CBC with Differential/Platelet    Next appt:  3 mos with hba1c, lipids before  Bobby Barton L. Dennis Hegeman, D.O. [facility] [address], [place] [address] Cell Phone (Mon-Fri 8am-5pm):  [phone] On Call:  [phone] & follow prompts after 5pm & weekends Office Phone:  [phone]  Office Fax:  [phone]

## 2014-09-22 DIAGNOSIS — M5136 Other intervertebral disc degeneration, lumbar region: Secondary | ICD-10-CM | POA: Diagnosis not present

## 2014-09-22 LAB — CBC WITH DIFFERENTIAL/PLATELET
Basophils Absolute: 0 10*3/uL (ref 0.0–0.2)
Basos: 0 %
EOS (ABSOLUTE): 0.1 10*3/uL (ref 0.0–0.4)
Eos: 0 %
Hematocrit: 40.1 % (ref 34.0–46.6)
Hemoglobin: 13.1 g/dL (ref 11.1–15.9)
Immature Grans (Abs): 0 10*3/uL (ref 0.0–0.1)
Immature Granulocytes: 0 %
Lymphocytes Absolute: 2 10*3/uL (ref 0.7–3.1)
Lymphs: 16 %
MCH: 28.5 pg (ref 26.6–33.0)
MCHC: 32.7 g/dL (ref 31.5–35.7)
MCV: 87 fL (ref 79–97)
Monocytes Absolute: 0.6 10*3/uL (ref 0.1–0.9)
Monocytes: 5 %
Neutrophils Absolute: 9.9 10*3/uL — ABNORMAL HIGH (ref 1.4–7.0)
Neutrophils: 79 %
Platelets: 355 10*3/uL (ref 150–379)
RBC: 4.59 x10E6/uL (ref 3.77–5.28)
RDW: 15.5 % — ABNORMAL HIGH (ref 12.3–15.4)
WBC: 12.6 10*3/uL — ABNORMAL HIGH (ref 3.4–10.8)

## 2014-09-22 LAB — COMPREHENSIVE METABOLIC PANEL
ALT: 16 IU/L (ref 0–32)
AST: 16 IU/L (ref 0–40)
Albumin/Globulin Ratio: 1.9 (ref 1.1–2.5)
Albumin: 4.8 g/dL (ref 3.6–4.8)
Alkaline Phosphatase: 78 IU/L (ref 39–117)
BUN/Creatinine Ratio: 21 (ref 11–26)
BUN: 16 mg/dL (ref 8–27)
Bilirubin Total: 0.2 mg/dL (ref 0.0–1.2)
CO2: 22 mmol/L (ref 18–29)
Calcium: 10.3 mg/dL (ref 8.7–10.3)
Chloride: 98 mmol/L (ref 97–108)
Creatinine, Ser: 0.78 mg/dL (ref 0.57–1.00)
GFR calc Af Amer: 90 mL/min/{1.73_m2} (ref >59)
GFR calc non Af Amer: 78 mL/min/{1.73_m2} (ref >59)
Globulin, Total: 2.5 g/dL (ref 1.5–4.5)
Glucose: 102 mg/dL — ABNORMAL HIGH (ref 65–99)
Potassium: 4.7 mmol/L (ref 3.5–5.2)
Sodium: 137 mmol/L (ref 134–144)
Total Protein: 7.3 g/dL (ref 6.0–8.5)

## 2014-09-22 LAB — HEMOGLOBIN A1C
Est. average glucose Bld gHb Est-mCnc: 126 mg/dL
Hgb A1c MFr Bld: 6 % — ABNORMAL HIGH (ref 4.8–5.6)

## 2014-11-05 DIAGNOSIS — M5136 Other intervertebral disc degeneration, lumbar region: Secondary | ICD-10-CM | POA: Diagnosis not present

## 2014-11-28 DIAGNOSIS — H16143 Punctate keratitis, bilateral: Secondary | ICD-10-CM | POA: Diagnosis not present

## 2014-11-28 DIAGNOSIS — H2513 Age-related nuclear cataract, bilateral: Secondary | ICD-10-CM | POA: Diagnosis not present

## 2014-11-28 DIAGNOSIS — H11423 Conjunctival edema, bilateral: Secondary | ICD-10-CM | POA: Diagnosis not present

## 2014-11-28 DIAGNOSIS — H11153 Pinguecula, bilateral: Secondary | ICD-10-CM | POA: Diagnosis not present

## 2014-11-28 DIAGNOSIS — H25013 Cortical age-related cataract, bilateral: Secondary | ICD-10-CM | POA: Diagnosis not present

## 2014-11-28 DIAGNOSIS — H401131 Primary open-angle glaucoma, bilateral, mild stage: Secondary | ICD-10-CM | POA: Diagnosis not present

## 2014-11-28 DIAGNOSIS — H40033 Anatomical narrow angle, bilateral: Secondary | ICD-10-CM | POA: Diagnosis not present

## 2014-11-28 DIAGNOSIS — H04123 Dry eye syndrome of bilateral lacrimal glands: Secondary | ICD-10-CM | POA: Diagnosis not present

## 2014-11-28 DIAGNOSIS — H18413 Arcus senilis, bilateral: Secondary | ICD-10-CM | POA: Diagnosis not present

## 2014-11-28 LAB — HM DIABETES EYE EXAM

## 2014-11-29 ENCOUNTER — Encounter: Payer: Self-pay | Admitting: *Deleted

## 2014-12-13 ENCOUNTER — Other Ambulatory Visit: Payer: Self-pay | Admitting: Internal Medicine

## 2014-12-21 ENCOUNTER — Other Ambulatory Visit: Payer: Medicare Other

## 2014-12-21 DIAGNOSIS — E1142 Type 2 diabetes mellitus with diabetic polyneuropathy: Secondary | ICD-10-CM

## 2014-12-21 DIAGNOSIS — E785 Hyperlipidemia, unspecified: Secondary | ICD-10-CM

## 2014-12-21 DIAGNOSIS — E1169 Type 2 diabetes mellitus with other specified complication: Secondary | ICD-10-CM | POA: Diagnosis not present

## 2014-12-22 ENCOUNTER — Other Ambulatory Visit: Payer: Self-pay | Admitting: Internal Medicine

## 2014-12-22 LAB — LIPID PANEL
Chol/HDL Ratio: 4 ratio units (ref 0.0–4.4)
Cholesterol, Total: 157 mg/dL (ref 100–199)
HDL: 39 mg/dL — ABNORMAL LOW (ref >39)
LDL Calculated: 101 mg/dL — ABNORMAL HIGH (ref 0–99)
Triglycerides: 83 mg/dL (ref 0–149)
VLDL Cholesterol Cal: 17 mg/dL (ref 5–40)

## 2014-12-22 LAB — HEMOGLOBIN A1C
Est. average glucose Bld gHb Est-mCnc: 128 mg/dL
Hgb A1c MFr Bld: 6.1 % — ABNORMAL HIGH (ref 4.8–5.6)

## 2014-12-24 ENCOUNTER — Encounter: Payer: Self-pay | Admitting: Internal Medicine

## 2014-12-24 ENCOUNTER — Ambulatory Visit (INDEPENDENT_AMBULATORY_CARE_PROVIDER_SITE_OTHER): Payer: Medicare Other | Admitting: Internal Medicine

## 2014-12-24 VITALS — BP 132/86 | HR 80 | Temp 97.8°F

## 2014-12-24 DIAGNOSIS — E1142 Type 2 diabetes mellitus with diabetic polyneuropathy: Secondary | ICD-10-CM | POA: Diagnosis not present

## 2014-12-24 DIAGNOSIS — Z716 Tobacco abuse counseling: Secondary | ICD-10-CM | POA: Diagnosis not present

## 2014-12-24 DIAGNOSIS — E785 Hyperlipidemia, unspecified: Secondary | ICD-10-CM

## 2014-12-24 DIAGNOSIS — M5441 Lumbago with sciatica, right side: Secondary | ICD-10-CM | POA: Diagnosis not present

## 2014-12-24 DIAGNOSIS — K219 Gastro-esophageal reflux disease without esophagitis: Secondary | ICD-10-CM

## 2014-12-24 DIAGNOSIS — I10 Essential (primary) hypertension: Secondary | ICD-10-CM | POA: Diagnosis not present

## 2014-12-24 DIAGNOSIS — E1169 Type 2 diabetes mellitus with other specified complication: Secondary | ICD-10-CM | POA: Diagnosis not present

## 2014-12-24 MED ORDER — LANSOPRAZOLE 30 MG PO CPDR: 30.0000 mg | DELAYED_RELEASE_CAPSULE | Freq: Every day | ORAL | Status: DC

## 2014-12-24 MED ORDER — NICOTINE 7 MG/24HR TD PT24: 7.0000 mg | MEDICATED_PATCH | TRANSDERMAL | Status: DC

## 2014-12-24 MED ORDER — NICOTINE 14 MG/24HR TD PT24: 14.0000 mg | MEDICATED_PATCH | TRANSDERMAL | Status: DC

## 2014-12-24 NOTE — Progress Notes (Signed)
Patient ID: Alison [name], female   DOB: [birth year], 69 y.o.   MRN: [id number]   Location: [place] Provider: Rexene Edison. Mariea Clonts, D.O., C.M.D.  Code Status: full code Goals of Care: Advanced Directive information Does patient have an advance directive?: No, Would patient like information on creating an advanced directive?: No - patient declined information  Chief Complaint  Patient presents with  . Medical Management of Chronic Issues    blood sugar, blood pressure  . Medication Refill    Lansoprazole 30 mg, several months ago got it at Urgent Care for stomach. Wants to talk about getting it, only took it as needed.    HPI: Patient is a 69 y.o. female seen in the office today for med mgt of chronic diseases including hyperglycemia, htn, smoking.  Was here in Sept.    Was taking lansoprazole 30mg  her stomach originally from urgent care.  Had been told to take them at lunchtime.  When she felt better, she stopped them, but then used as needed after that.  One usually did the trick.  For her back, she went to Dr. Charlean Sanfilippo gabapentin.  Finally cleared up.  No problems at the moment with her back.  Sees ophtho tomorrow for laser procedure (Dr. Katy Fitch).  She has a clog in the left eye and the medication is not getting where it should per Dr. Shirley Muscat.  Gives eyelashes a bath in the morning and uses eye drops 4x per day.  Knees are still slowly coming along.  Still tight and has to do the exercises.  Gets a little pain in them.  She's doing what she needs to do.    Tobacco abuse:  Still smoking.  Says she has not made progress.  Needs to start the cessation and she realizes it.   Cannot attend the groups due to watching her father.    DMII:  Neuropathy in feet causes her occasionally to lose her balance.  Uses her cane.  She likes sandwiches.  Continues on metformin.    Hyperlipidemia:  Taking pravachol 80mg .  Had egg nog and eating meat sandwiches.   After her knee  operation, she was put on hydrocodone/apap.  She was having itching.  Takes regular tylenol and itches.  Uses it for her knees.  Only uses them as needed--not every day.    Pt will call [facility] to set up bone density and mammogram.  Review of Systems:  Review of Systems  Constitutional: Negative for fever, chills and malaise/fatigue.  HENT: Negative for congestion and hearing loss.   Eyes: Negative for blurred vision.       Left eye in need of laser procedure  Respiratory: Negative for cough and shortness of breath.   Cardiovascular: Negative for chest pain and leg swelling.  Gastrointestinal: Negative for abdominal pain, constipation, blood in stool and melena.  Genitourinary: Negative for dysuria.  Musculoskeletal: Positive for back pain and joint pain. Negative for falls.       Sciatica portion resolved again; bilateral knees still stiff and sore at times  Skin: Positive for itching. Negative for rash.       When takes tylenol, has had to take benadryl  Neurological: Negative for dizziness, loss of consciousness and weakness.  Endo/Heme/Allergies: Does not bruise/bleed easily.  Psychiatric/Behavioral: Negative for depression and memory loss.    Past Medical History  Diagnosis Date  . Unspecified vitamin D deficiency   . Obesity, unspecified   . Goiter, specified as simple   . Pernicious  anemia   . Hyperlipidemia LDL goal < 100   . Unspecified glaucoma   . Benign essential hypertension   . Diverticulitis of colon (without mention of hemorrhage)   . Osteoarthrosis, unspecified whether generalized or localized, lower leg   . Osteopenia   . Numbness     TOES RT FOOT and toes left foot  . Diabetes mellitus without complication ([place])   . Other specified disease of nail     FUNGUS OF FINGERNAILS    Past Surgical History  Procedure Laterality Date  . Total knee arthroplasty Right 02/01/2013    Procedure: RIGHT TOTAL KNEE ARTHROPLASTY;  Surgeon: Tobi Bastos, MD;  Location: [facility];  Service: Orthopedics;  Laterality: Right;  . Colectomy  2009  . 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 Back surgery    . Joint replacement      right  . Total knee arthroplasty Left 04/18/2014    Procedure: LEFT TOTAL KNEE ARTHROPLASTY;  Surgeon: Latanya Maudlin, MD;  Location: [facility];  Service: Orthopedics;  Laterality: Left;    Allergies  Allergen Reactions  . Aspirin Nausea Only    Can take 81 mg but can't take 325mg       Medication List       This list is accurate as of: 12/24/14  9:50 AM.  Always use your most recent med list.               acetaminophen 500 MG tablet  Commonly known as:  TYLENOL  Take 500 mg by mouth. Take one as needed     aspirin EC 81 MG tablet  Take 81 mg by mouth daily.     carboxymethylcellulose 0.5 % Soln  Commonly known as:  REFRESH PLUS  Place 1 drop into both eyes 3 (three) times daily as needed (dry eyes).     ferrous sulfate 325 (65 FE) MG tablet  Take 1 tablet (325 mg total) by mouth 2 (two) times daily with a meal.     latanoprost 0.005 % ophthalmic solution  Commonly known as:  XALATAN  INSTILL 1 DROP IN BOTH EYES EVERY DAY AT BEDTIME     lisinopril 5 MG tablet  Commonly known as:  PRINIVIL,ZESTRIL  Take 1 tablet (5 mg total) by mouth daily.     metFORMIN 500 MG tablet  Commonly known as:  GLUCOPHAGE  TAKE 1/2 TABLET BY MOUTH EVERY EVENING AT SUPPER     multivitamin tablet  Take 1 tablet by mouth daily.     omega-3 acid ethyl esters 1 G capsule  Commonly known as:  LOVAZA  Take 2 capsules (2 g total) by mouth daily.     Oyster Shell Calcium/D 250-125 MG-UNIT Tabs  Take 1 tablet by mouth.     pravastatin 80 MG tablet  Commonly known as:  PRAVACHOL  TAKE 1 TABLET BY MOUTH EVERY MORNING     predniSONE 5 MG  tablet  Commonly known as:  DELTASONE  Take 5 mg by mouth daily. 1 by mouth daily     Vitamin D-3 1000 UNITS Caps  Take by mouth daily.        Health Maintenance  Topic Date Due  . Hepatitis C Screening  [birth year]  . MAMMOGRAM  05/09/2015  . HEMOGLOBIN A1C  06/21/2015  .  INFLUENZA VACCINE  08/13/2015  . FOOT EXAM  09/21/2015  . OPHTHALMOLOGY EXAM  11/28/2015  . COLONOSCOPY  09/08/2017  . TETANUS/TDAP  10/29/2023  . DEXA SCAN  Completed  . ZOSTAVAX  Completed  . PNA vac Low Risk Adult  Completed    Physical Exam: Filed Vitals:   12/24/14 0937  BP: 132/86  Pulse: 80  Temp: 97.8 F (36.6 C)  TempSrc: Oral  SpO2: 97%   There is no weight on file to calculate BMI. Physical Exam  Constitutional: She is oriented to person, place, and time. She appears well-developed and well-nourished. No distress.  Cardiovascular: Normal rate, regular rhythm, normal heart sounds and intact distal pulses.   Pulmonary/Chest: Effort normal and breath sounds normal. No respiratory distress.  Abdominal: Soft. Bowel sounds are normal.  Musculoskeletal: Normal range of motion.  Slight limp with walking, not using cane at appt today, some stiffness and crepitus left knee greater than right  Neurological: She is alert and oriented to person, place, and time.  Skin: Skin is warm and dry.  Psychiatric: She has a normal mood and affect.    Labs reviewed: Basic Metabolic Panel:  Recent Labs  06/22/14 1017 07/27/14 1117 09/21/14 1046  NA 140 137 137  K 4.7 4.1 4.7  CL 100 103 98  CO2 25 26 22   GLUCOSE 86 101* 102*  BUN 12 7 16   CREATININE 0.71 0.69 0.78  CALCIUM 10.1 9.7 10.3   Liver Function Tests:  Recent Labs  04/11/14 1200 07/27/14 1117 09/21/14 1046  AST 33 16 16  ALT 20 14 16   ALKPHOS 59 75 78  BILITOT 1.0 0.3 0.2  PROT 7.9 7.5 7.3  ALBUMIN 4.3 4.3 4.8    Recent Labs  07/27/14 1117  LIPASE 28  AMYLASE 54   No results for input(s): AMMONIA in the last 8760  hours. CBC:  Recent Labs  04/11/14 1200 04/19/14 0425 04/20/14 0448 07/27/14 1131 09/21/14 1046  WBC 10.5 16.7* 18.4* 13.9* 12.6*  NEUTROABS 7.3  --   --   --  9.9*  HGB 12.9 10.5* 10.4* 12.1*  --   HCT 39.2 32.1* 31.3* 36.5* 40.1  MCV 89.3 88.9 87.4 80.6  --   PLT 301 291 265  --   --    Lipid Panel:  Recent Labs  03/19/14 0843 12/21/14 0836  CHOL 150 157  HDL 39* 39*  LDLCALC 94 101*  TRIG 86 83  CHOLHDL 3.8 4.0   Lab Results  Component Value Date   HGBA1C 6.1* 12/21/2014    Assessment/Plan 1. Tobacco abuse counseling - discussed options with her -she is interested in [employer] to cover (unsure that this will happen) but Rxs sent for 14mg  x 1 month, then 7mg  x 66month, then stop -explained approach to patient -she cannot attend the classes - nicotine (NICODERM CQ) 14 mg/24hr patch; Place 1 patch (14 mg total) onto the skin daily.  Dispense: 28 patch; Refill: 0 - nicotine (NICODERM CQ) 7 mg/24hr patch; Place 1 patch (7 mg total) onto the skin daily.  Dispense: 28 patch; Refill: 0  2. Gastroesophageal reflux disease, esophagitis presence not specified - will cont the lansoprazole which has helped her--she uses as needed - lansoprazole (PREVACID) 30 MG capsule; Take 1 capsule (30 mg total) by mouth daily at 12 noon.  Dispense: 30 capsule; Refill: 3  3. Type 2 diabetes mellitus with diabetic polyneuropathy, without long-term current use of insulin ([facility]) - cont metformin, lisinopril, asa 81 and  pravachol 80mg  -work on dietary changes more--counseled today on more fruits and veggies, less fried and fatty foods - CBC with Differential/Platelet; Future - Comprehensive metabolic panel; Future - Hemoglobin A1c; Future - also was on steroids short term which did not help this  4. Benign essential hypertension -bp at goal today -cont to work on diet and exercise, low sodium foods - Comprehensive metabolic panel; Future  5. Hyperlipidemia associated with  type 2 diabetes mellitus ([place]) - cont pravachol 80mg  daily -she may need a different med to treat this or addition of another agent -goal LDL <70 -admitted to some dietary selections that were poor and will work on these for next time; also was on steroids short term - Lipid panel; Future  6. Right-sided low back pain with right-sided sciatica -this has gotten much better now.   -tells me she was put on gabapentin for the pain, but it's not on her current med list -she had a course of steroids back in September, but not anymore  Labs/tests ordered:   Orders Placed This Encounter  Procedures  . CBC with Differential/Platelet    Standing Status: Future     Number of Occurrences:      Standing Expiration Date: 06/24/2015  . Comprehensive metabolic panel    Standing Status: Future     Number of Occurrences:      Standing Expiration Date: 06/24/2015    Order Specific Question:  Has the patient fasted?    Answer:  Yes  . Hemoglobin A1c    Standing Status: Future     Number of Occurrences:      Standing Expiration Date: 06/24/2015  . Lipid panel    Standing Status: Future     Number of Occurrences:      Standing Expiration Date: 06/24/2015    Order Specific Question:  Has the patient fasted?    Answer:  Yes    Next appt:  04/04/2015 med mgt labs before   Aamari West L. Lillymae Duet, D.O. [facility] [address], [place] [address] Cell Phone (Mon-Fri 8am-5pm):  [phone] On Call:  [phone] & follow prompts after 5pm & weekends Office Phone:  [phone] Office Fax:  [phone]

## 2014-12-24 NOTE — Patient Instructions (Addendum)
Use the 14mg  nicoderm for 1 month, then reduce to 7mg  for the next month and stop.    Please call [facility] to set up your mammogram and bone density.

## 2014-12-25 DIAGNOSIS — H43813 Vitreous degeneration, bilateral: Secondary | ICD-10-CM | POA: Diagnosis not present

## 2014-12-25 DIAGNOSIS — H401131 Primary open-angle glaucoma, bilateral, mild stage: Secondary | ICD-10-CM | POA: Diagnosis not present

## 2014-12-25 DIAGNOSIS — H2513 Age-related nuclear cataract, bilateral: Secondary | ICD-10-CM | POA: Diagnosis not present

## 2015-01-01 DIAGNOSIS — H401131 Primary open-angle glaucoma, bilateral, mild stage: Secondary | ICD-10-CM | POA: Diagnosis not present

## 2015-01-15 DIAGNOSIS — H401131 Primary open-angle glaucoma, bilateral, mild stage: Secondary | ICD-10-CM | POA: Diagnosis not present

## 2015-02-26 DIAGNOSIS — H2513 Age-related nuclear cataract, bilateral: Secondary | ICD-10-CM | POA: Diagnosis not present

## 2015-02-26 DIAGNOSIS — H11153 Pinguecula, bilateral: Secondary | ICD-10-CM | POA: Diagnosis not present

## 2015-02-26 DIAGNOSIS — H18413 Arcus senilis, bilateral: Secondary | ICD-10-CM | POA: Diagnosis not present

## 2015-02-26 DIAGNOSIS — H401131 Primary open-angle glaucoma, bilateral, mild stage: Secondary | ICD-10-CM | POA: Diagnosis not present

## 2015-02-26 DIAGNOSIS — H11423 Conjunctival edema, bilateral: Secondary | ICD-10-CM | POA: Diagnosis not present

## 2015-03-29 ENCOUNTER — Other Ambulatory Visit: Payer: Medicare Other

## 2015-03-29 DIAGNOSIS — E1142 Type 2 diabetes mellitus with diabetic polyneuropathy: Secondary | ICD-10-CM | POA: Diagnosis not present

## 2015-03-29 DIAGNOSIS — E785 Hyperlipidemia, unspecified: Secondary | ICD-10-CM | POA: Diagnosis not present

## 2015-03-29 DIAGNOSIS — E1169 Type 2 diabetes mellitus with other specified complication: Secondary | ICD-10-CM

## 2015-03-29 DIAGNOSIS — I10 Essential (primary) hypertension: Secondary | ICD-10-CM

## 2015-03-30 LAB — COMPREHENSIVE METABOLIC PANEL
ALT: 12 IU/L (ref 0–32)
AST: 17 IU/L (ref 0–40)
Albumin/Globulin Ratio: 1.7 (ref 1.2–2.2)
Albumin: 4.3 g/dL (ref 3.5–4.8)
Alkaline Phosphatase: 60 IU/L (ref 39–117)
BUN/Creatinine Ratio: 15 (ref 11–26)
BUN: 11 mg/dL (ref 8–27)
Bilirubin Total: 0.3 mg/dL (ref 0.0–1.2)
CO2: 25 mmol/L (ref 18–29)
Calcium: 9.9 mg/dL (ref 8.7–10.3)
Chloride: 102 mmol/L (ref 96–106)
Creatinine, Ser: 0.75 mg/dL (ref 0.57–1.00)
GFR calc Af Amer: 93 mL/min/{1.73_m2} (ref >59)
GFR calc non Af Amer: 81 mL/min/{1.73_m2} (ref >59)
Globulin, Total: 2.5 g/dL (ref 1.5–4.5)
Glucose: 96 mg/dL (ref 65–99)
Potassium: 4.4 mmol/L (ref 3.5–5.2)
Sodium: 142 mmol/L (ref 134–144)
Total Protein: 6.8 g/dL (ref 6.0–8.5)

## 2015-03-30 LAB — CBC WITH DIFFERENTIAL/PLATELET
Basophils Absolute: 0 10*3/uL (ref 0.0–0.2)
Basos: 0 %
EOS (ABSOLUTE): 0.2 10*3/uL (ref 0.0–0.4)
Eos: 2 %
Hematocrit: 37.9 % (ref 34.0–46.6)
Hemoglobin: 12.4 g/dL (ref 11.1–15.9)
Immature Grans (Abs): 0 10*3/uL (ref 0.0–0.1)
Immature Granulocytes: 0 %
Lymphocytes Absolute: 2.1 10*3/uL (ref 0.7–3.1)
Lymphs: 22 %
MCH: 28.8 pg (ref 26.6–33.0)
MCHC: 32.7 g/dL (ref 31.5–35.7)
MCV: 88 fL (ref 79–97)
Monocytes Absolute: 1 10*3/uL — ABNORMAL HIGH (ref 0.1–0.9)
Monocytes: 10 %
Neutrophils Absolute: 6.5 10*3/uL (ref 1.4–7.0)
Neutrophils: 66 %
Platelets: 361 10*3/uL (ref 150–379)
RBC: 4.3 x10E6/uL (ref 3.77–5.28)
RDW: 13.9 % (ref 12.3–15.4)
WBC: 9.8 10*3/uL (ref 3.4–10.8)

## 2015-03-30 LAB — HEMOGLOBIN A1C
Est. average glucose Bld gHb Est-mCnc: 123 mg/dL
Hgb A1c MFr Bld: 5.9 % — ABNORMAL HIGH (ref 4.8–5.6)

## 2015-03-30 LAB — LIPID PANEL
Chol/HDL Ratio: 4.4 ratio units (ref 0.0–4.4)
Cholesterol, Total: 161 mg/dL (ref 100–199)
HDL: 37 mg/dL — ABNORMAL LOW (ref >39)
LDL Calculated: 98 mg/dL (ref 0–99)
Triglycerides: 129 mg/dL (ref 0–149)
VLDL Cholesterol Cal: 26 mg/dL (ref 5–40)

## 2015-04-04 ENCOUNTER — Encounter: Payer: Self-pay | Admitting: Internal Medicine

## 2015-04-04 ENCOUNTER — Ambulatory Visit (INDEPENDENT_AMBULATORY_CARE_PROVIDER_SITE_OTHER): Payer: Medicare Other | Admitting: Internal Medicine

## 2015-04-04 VITALS — BP 148/88 | HR 84 | Temp 98.4°F | Ht 66.0 in | Wt 209.0 lb

## 2015-04-04 DIAGNOSIS — M67441 Ganglion, right hand: Secondary | ICD-10-CM

## 2015-04-04 DIAGNOSIS — I10 Essential (primary) hypertension: Secondary | ICD-10-CM

## 2015-04-04 DIAGNOSIS — Z780 Asymptomatic menopausal state: Secondary | ICD-10-CM | POA: Diagnosis not present

## 2015-04-04 DIAGNOSIS — E1142 Type 2 diabetes mellitus with diabetic polyneuropathy: Secondary | ICD-10-CM | POA: Diagnosis not present

## 2015-04-04 DIAGNOSIS — E785 Hyperlipidemia, unspecified: Secondary | ICD-10-CM | POA: Diagnosis not present

## 2015-04-04 DIAGNOSIS — E1169 Type 2 diabetes mellitus with other specified complication: Secondary | ICD-10-CM | POA: Diagnosis not present

## 2015-04-04 DIAGNOSIS — Z720 Tobacco use: Secondary | ICD-10-CM

## 2015-04-04 NOTE — Progress Notes (Signed)
Patient ID: Alison [name], female   DOB: 1945-02-16, 70 y.o.   MRN: [id number]   Location:  [facility] clinic Provider:  Tali Cleaves L. Mariea Clonts, D.O., C.M.D.  Code Status: full code Goals of Care:  Advanced Directives 04/04/2015  Does patient have an advance directive? No  Would patient like information on creating an advanced directive? Yes - Multimedia programmer Complaint  Patient presents with  . Medical Management of Chronic Issues    3 mth follow-up    HPI: Patient is a 70 y.o. female seen today for medical management of chronic diseases.    Has painful nodule on right hand at base of her second digit.  Only hurts if she flexes her hand.  Has been rubbing a topical otc medication on it with some benefit.    Tobacco abuse:  Ongoing.  No different.    HTN:  bp elevated likely due to smoking.  She is on ACE so microalbumin not needed.  Did smoke this am.  She will take her pressure daily and write them down.  hba1c excellent at 5.9.   Rides her bike 2x per day for 20 mins.  Eating has not been good.    Obesity:  Weight has gone up to 209 from 192 in 3 mos.  BMI up to 33.  Likes her bread especially raisin bread.  LDL slightly improved with pravachol 80mg .    Past Medical History  Diagnosis Date  . Unspecified vitamin D deficiency   . Obesity, unspecified   . Goiter, specified as simple   . Pernicious anemia   . Hyperlipidemia LDL goal < 100   . Unspecified glaucoma   . Benign essential hypertension   . Diverticulitis of colon (without mention of hemorrhage)   . Osteoarthrosis, unspecified whether generalized or localized, lower leg   . Osteopenia   . Numbness     TOES RT FOOT and toes left foot  . Diabetes mellitus without complication ([place])   . Other specified disease of nail     FUNGUS OF FINGERNAILS    Past Surgical History  Procedure Laterality Date  . Total knee arthroplasty Right 02/01/2013    Procedure: RIGHT TOTAL KNEE ARTHROPLASTY;  Surgeon: Tobi Bastos, MD;  Location: [facility];  Service: Orthopedics;  Laterality: Right;  . Colectomy  2009  . 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 Back surgery    . Joint replacement      right  . Total knee arthroplasty Left 04/18/2014    Procedure: LEFT TOTAL KNEE ARTHROPLASTY;  Surgeon: Latanya Maudlin, MD;  Location: [facility];  Service: Orthopedics;  Laterality: Left;    Allergies  Allergen Reactions  . Aspirin Nausea Only    Can take 81 mg but can't take 325mg   . Tylenol [Acetaminophen] Itching      Medication List       This list is accurate as of: 04/04/15 10:17 AM.  Always use your most recent med list.               aspirin EC 81 MG tablet  Take 81 mg by mouth daily.     carboxymethylcellulose 0.5 % Soln  Commonly known as:  REFRESH PLUS  Place 1 drop into both eyes 3 (three) times daily as needed (  dry eyes).     ferrous sulfate 325 (65 FE) MG tablet  Take 1 tablet (325 mg total) by mouth 2 (two) times daily with a meal.     lansoprazole 30 MG capsule  Commonly known as:  PREVACID  Take 1 capsule (30 mg total) by mouth daily at 12 noon.     latanoprost 0.005 % ophthalmic solution  Commonly known as:  XALATAN  INSTILL 1 DROP IN BOTH EYES EVERY DAY AT BEDTIME     lisinopril 5 MG tablet  Commonly known as:  PRINIVIL,ZESTRIL  Take 1 tablet (5 mg total) by mouth daily.     metFORMIN 500 MG tablet  Commonly known as:  GLUCOPHAGE  TAKE 1/2 TABLET BY MOUTH EVERY EVENING AT SUPPER     multivitamin tablet  Take 1 tablet by mouth daily.     omega-3 acid ethyl esters 1 g capsule  Commonly known as:  LOVAZA  Take 1 g by mouth daily.     Oyster Shell Calcium/D 250-125 MG-UNIT Tabs  Take 1 tablet by mouth.     pravastatin 80 MG tablet  Commonly known as:  PRAVACHOL  TAKE  1 TABLET BY MOUTH EVERY MORNING     Vitamin D-3 1000 units Caps  Take by mouth daily.        Review of Systems:  Review of Systems  Constitutional:       Obesity  HENT: Negative for congestion and hearing loss.   Eyes: Negative for blurred vision.       Glasses  Respiratory: Negative for cough and shortness of breath.        Still smokes  Cardiovascular: Negative for chest pain and leg swelling.  Gastrointestinal: Negative for abdominal pain and constipation.  Genitourinary: Negative for dysuria.  Musculoskeletal: Positive for joint pain. Negative for falls.       Nodule right hand painful  Neurological: Negative for dizziness and loss of consciousness.  Psychiatric/Behavioral: Negative for depression and memory loss.    Health Maintenance  Topic Date Due  . Hepatitis C Screening  22-Oct-1945  . MAMMOGRAM  05/09/2015  . INFLUENZA VACCINE  08/13/2015  . FOOT EXAM  09/21/2015  . HEMOGLOBIN A1C  09/29/2015  . OPHTHALMOLOGY EXAM  11/28/2015  . COLONOSCOPY  09/08/2017  . TETANUS/TDAP  10/29/2023  . DEXA SCAN  Completed  . ZOSTAVAX  Completed  . PNA vac Low Risk Adult  Completed    Physical Exam: Filed Vitals:   04/04/15 0952  BP: 148/88  Pulse: 84  Temp: 98.4 F (36.9 C)  TempSrc: Oral  Height: 5\' 6"  (1.676 m)  Weight: 209 lb (94.802 kg)  SpO2: 95%   Body mass index is 33.75 kg/(m^2). Physical Exam  Constitutional: She is oriented to person, place, and time. She appears well-developed and well-nourished. No distress.  Cardiovascular: Normal rate, regular rhythm, normal heart sounds and intact distal pulses.   Pulmonary/Chest: Effort normal and breath sounds normal. She has no wheezes. She has no rales.  Abdominal: Soft. Bowel sounds are normal. She exhibits no distension. There is no tenderness.  Musculoskeletal: Normal range of motion.  Right hand with nodule over  tendon in palm  Neurological: She is alert and oriented to person, place, and time.  Skin: Skin  is warm and dry.  Psychiatric: She has a normal mood and affect.    Labs reviewed: Basic Metabolic Panel:  Recent Labs  07/27/14 1117 09/21/14 1046 03/29/15 0814  NA 137 137 142  K 4.1  4.7 4.4  CL 103 98 102  CO2 26 22 25   GLUCOSE 101* 102* 96  BUN 7 16 11   CREATININE 0.69 0.78 0.75  CALCIUM 9.7 10.3 9.9   Liver Function Tests:  Recent Labs  07/27/14 1117 09/21/14 1046 03/29/15 0814  AST 16 16 17   ALT 14 16 12   ALKPHOS 75 78 60  BILITOT 0.3 0.2 0.3  PROT 7.5 7.3 6.8  ALBUMIN 4.3 4.8 4.3    Recent Labs  07/27/14 1117  LIPASE 28  AMYLASE 54   No results for input(s): AMMONIA in the last 8760 hours. CBC:  Recent Labs  04/11/14 1200 04/19/14 0425 04/20/14 0448 07/27/14 1131 09/21/14 1046 03/29/15 0814  WBC 10.5 16.7* 18.4* 13.9* 12.6* 9.8  NEUTROABS 7.3  --   --   --  9.9* 6.5  HGB 12.9 10.5* 10.4* 12.1*  --   --   HCT 39.2 32.1* 31.3* 36.5* 40.1 37.9  MCV 89.3 88.9 87.4 80.6 87 88  PLT 301 291 265  --  355 361   Lipid Panel:  Recent Labs  12/21/14 0836 03/29/15 0814  CHOL 157 161  HDL 39* 37*  LDLCALC 101* 98  TRIG 83 129  CHOLHDL 4.0 4.4   Lab Results  Component Value Date   HGBA1C 5.9* 03/29/2015    Assessment/Plan 1. Ganglion cyst of flexor tendon sheath of finger of right hand -counseled that this could be removed by orthopedic surgery, but could return, she is going to continue topical treatment and call me back if it gets worse or continues to get larger  2. Type 2 diabetes mellitus with diabetic polyneuropathy, without long-term current use of insulin (HCC) - control has improved a little with riding her bike and getting more active again (had been more sedentary after her knee and then back surgeries) - Hemoglobin A1c; Future - Basic metabolic panel; Future -cont baby asa, ace, metformin, pravachol  3. Benign essential hypertension - bp elevated b/c she was smoking this am -she will monitor her bp at home and let me know if  still running over 140/90 there -cont same meds for now - Basic metabolic panel; Future  4. Hyperlipidemia associated with type 2 diabetes mellitus ([place]) - cont on pravachol -lipids are getting closer to goal--now 98, goal <70 due to DMII - Lipid panel; Future  5. Tobacco abuse -ongoing and not ready to quit  6. Postmenopausal estrogen deficiency - agrees to schedule bone density to see where this stands with her ongoing tobacco abuse, is taking ca with D and additional D and working on exercise - DG Bone Density; Future  Labs/tests ordered:   Orders Placed This Encounter  Procedures  . DG Bone Density    Standing Status: Future     Number of Occurrences:      Standing Expiration Date: 06/03/2016    Scheduling Instructions:     [facility]--has mammogram already on 3/27, please also perform bone density.    Order Specific Question:  Reason for Exam (SYMPTOM  OR DIAGNOSIS REQUIRED)    Answer:  postmenopausal estrogen deficiency    Order Specific Question:  Preferred imaging location?    Answer:  External  . Hemoglobin A1c    Standing Status: Future     Number of Occurrences:      Standing Expiration Date: 01/04/2016  . Lipid panel    Standing Status: Future     Number of Occurrences:      Standing Expiration Date: 01/04/2016  Order Specific Question:  Has the patient fasted?    Answer:  Yes  . Basic metabolic panel    Standing Status: Future     Number of Occurrences:      Standing Expiration Date: 01/04/2016    Order Specific Question:  Has the patient fasted?    Answer:  Yes  FOBT  Next appt:  3 mos, med mgt w/ labs before  Melquan Ernsberger L. Hassaan Crite, D.O. [facility] [address], [place] [address] Cell Phone (Mon-Fri 8am-5pm):  [phone] On Call:  [phone] & follow prompts after 5pm & weekends Office Phone:  [phone] Office Fax:  [phone]

## 2015-04-04 NOTE — Patient Instructions (Signed)
Take you bp daily and write them down for me and bring in next time.

## 2015-04-08 DIAGNOSIS — Z780 Asymptomatic menopausal state: Secondary | ICD-10-CM | POA: Diagnosis not present

## 2015-04-08 DIAGNOSIS — Z8262 Family history of osteoporosis: Secondary | ICD-10-CM | POA: Diagnosis not present

## 2015-04-08 DIAGNOSIS — Z1231 Encounter for screening mammogram for malignant neoplasm of breast: Secondary | ICD-10-CM | POA: Diagnosis not present

## 2015-04-08 LAB — HM MAMMOGRAPHY

## 2015-04-08 LAB — HM DEXA SCAN: HM DEXA SCAN: NORMAL

## 2015-04-09 ENCOUNTER — Encounter: Payer: Self-pay | Admitting: *Deleted

## 2015-04-25 ENCOUNTER — Other Ambulatory Visit: Payer: Self-pay | Admitting: Internal Medicine

## 2015-04-27 DIAGNOSIS — E1169 Type 2 diabetes mellitus with other specified complication: Secondary | ICD-10-CM | POA: Insufficient documentation

## 2015-04-27 DIAGNOSIS — E785 Hyperlipidemia, unspecified: Secondary | ICD-10-CM | POA: Insufficient documentation

## 2015-05-01 ENCOUNTER — Telehealth: Payer: Self-pay

## 2015-05-01 NOTE — Telephone Encounter (Signed)
Prior authorization request for Lansoprazole 30 mg capsules was received. Prior authorization was initiated by calling [phone]. Patient ID# [id number].   APPROVED until 01-12-2016. Confirmation number [id number].  Pharmacy and patient have been notified.

## 2015-05-09 DIAGNOSIS — H401131 Primary open-angle glaucoma, bilateral, mild stage: Secondary | ICD-10-CM | POA: Diagnosis not present

## 2015-05-09 DIAGNOSIS — H25043 Posterior subcapsular polar age-related cataract, bilateral: Secondary | ICD-10-CM | POA: Diagnosis not present

## 2015-05-09 DIAGNOSIS — H18413 Arcus senilis, bilateral: Secondary | ICD-10-CM | POA: Diagnosis not present

## 2015-05-09 DIAGNOSIS — H40033 Anatomical narrow angle, bilateral: Secondary | ICD-10-CM | POA: Diagnosis not present

## 2015-05-09 DIAGNOSIS — H04123 Dry eye syndrome of bilateral lacrimal glands: Secondary | ICD-10-CM | POA: Diagnosis not present

## 2015-05-09 DIAGNOSIS — H52223 Regular astigmatism, bilateral: Secondary | ICD-10-CM | POA: Diagnosis not present

## 2015-05-09 DIAGNOSIS — H25013 Cortical age-related cataract, bilateral: Secondary | ICD-10-CM | POA: Diagnosis not present

## 2015-05-09 DIAGNOSIS — H11423 Conjunctival edema, bilateral: Secondary | ICD-10-CM | POA: Diagnosis not present

## 2015-05-09 DIAGNOSIS — H5203 Hypermetropia, bilateral: Secondary | ICD-10-CM | POA: Diagnosis not present

## 2015-05-09 DIAGNOSIS — H2513 Age-related nuclear cataract, bilateral: Secondary | ICD-10-CM | POA: Diagnosis not present

## 2015-05-09 DIAGNOSIS — H11153 Pinguecula, bilateral: Secondary | ICD-10-CM | POA: Diagnosis not present

## 2015-05-09 LAB — HM DIABETES EYE EXAM

## 2015-05-10 ENCOUNTER — Encounter: Payer: Self-pay | Admitting: *Deleted

## 2015-05-21 DIAGNOSIS — H401131 Primary open-angle glaucoma, bilateral, mild stage: Secondary | ICD-10-CM | POA: Diagnosis not present

## 2015-05-21 DIAGNOSIS — H2513 Age-related nuclear cataract, bilateral: Secondary | ICD-10-CM | POA: Diagnosis not present

## 2015-05-21 DIAGNOSIS — H31091 Other chorioretinal scars, right eye: Secondary | ICD-10-CM | POA: Diagnosis not present

## 2015-06-16 ENCOUNTER — Other Ambulatory Visit: Payer: Self-pay | Admitting: Internal Medicine

## 2015-06-19 ENCOUNTER — Other Ambulatory Visit: Payer: Self-pay | Admitting: *Deleted

## 2015-06-19 DIAGNOSIS — E1142 Type 2 diabetes mellitus with diabetic polyneuropathy: Secondary | ICD-10-CM

## 2015-06-19 MED ORDER — METFORMIN HCL 500 MG PO TABS: ORAL_TABLET | ORAL | Status: DC

## 2015-06-19 NOTE — Telephone Encounter (Signed)
[facility] 

## 2015-06-20 DIAGNOSIS — H2511 Age-related nuclear cataract, right eye: Secondary | ICD-10-CM | POA: Diagnosis not present

## 2015-06-20 DIAGNOSIS — H401113 Primary open-angle glaucoma, right eye, severe stage: Secondary | ICD-10-CM | POA: Diagnosis not present

## 2015-06-20 DIAGNOSIS — H401111 Primary open-angle glaucoma, right eye, mild stage: Secondary | ICD-10-CM | POA: Diagnosis not present

## 2015-06-22 DIAGNOSIS — H2511 Age-related nuclear cataract, right eye: Secondary | ICD-10-CM | POA: Diagnosis not present

## 2015-07-05 ENCOUNTER — Other Ambulatory Visit: Payer: Medicare Other

## 2015-07-05 DIAGNOSIS — E1142 Type 2 diabetes mellitus with diabetic polyneuropathy: Secondary | ICD-10-CM | POA: Diagnosis not present

## 2015-07-05 DIAGNOSIS — I10 Essential (primary) hypertension: Secondary | ICD-10-CM

## 2015-07-05 DIAGNOSIS — E785 Hyperlipidemia, unspecified: Secondary | ICD-10-CM

## 2015-07-05 DIAGNOSIS — E1169 Type 2 diabetes mellitus with other specified complication: Secondary | ICD-10-CM | POA: Diagnosis not present

## 2015-07-06 LAB — BASIC METABOLIC PANEL
BUN/Creatinine Ratio: 15 (ref 12–28)
BUN: 11 mg/dL (ref 8–27)
CO2: 24 mmol/L (ref 18–29)
Calcium: 9.9 mg/dL (ref 8.7–10.3)
Chloride: 98 mmol/L (ref 96–106)
Creatinine, Ser: 0.74 mg/dL (ref 0.57–1.00)
GFR calc Af Amer: 95 mL/min/{1.73_m2} (ref >59)
GFR calc non Af Amer: 82 mL/min/{1.73_m2} (ref >59)
Glucose: 101 mg/dL — ABNORMAL HIGH (ref 65–99)
Potassium: 4.4 mmol/L (ref 3.5–5.2)
Sodium: 138 mmol/L (ref 134–144)

## 2015-07-06 LAB — HEMOGLOBIN A1C
Est. average glucose Bld gHb Est-mCnc: 126 mg/dL
Hgb A1c MFr Bld: 6 % — ABNORMAL HIGH (ref 4.8–5.6)

## 2015-07-06 LAB — LIPID PANEL
Chol/HDL Ratio: 4 ratio units (ref 0.0–4.4)
Cholesterol, Total: 156 mg/dL (ref 100–199)
HDL: 39 mg/dL — ABNORMAL LOW (ref >39)
LDL Calculated: 97 mg/dL (ref 0–99)
Triglycerides: 99 mg/dL (ref 0–149)
VLDL Cholesterol Cal: 20 mg/dL (ref 5–40)

## 2015-07-08 ENCOUNTER — Encounter: Payer: Self-pay | Admitting: Internal Medicine

## 2015-07-08 ENCOUNTER — Encounter: Payer: Self-pay | Admitting: *Deleted

## 2015-07-08 ENCOUNTER — Ambulatory Visit (INDEPENDENT_AMBULATORY_CARE_PROVIDER_SITE_OTHER): Payer: Medicare Other | Admitting: Internal Medicine

## 2015-07-08 VITALS — BP 138/78 | HR 82 | Temp 98.5°F | Ht 66.0 in | Wt 208.0 lb

## 2015-07-08 DIAGNOSIS — E049 Nontoxic goiter, unspecified: Secondary | ICD-10-CM

## 2015-07-08 DIAGNOSIS — H40053 Ocular hypertension, bilateral: Secondary | ICD-10-CM | POA: Diagnosis not present

## 2015-07-08 DIAGNOSIS — Z720 Tobacco use: Secondary | ICD-10-CM | POA: Diagnosis not present

## 2015-07-08 DIAGNOSIS — H269 Unspecified cataract: Secondary | ICD-10-CM

## 2015-07-08 DIAGNOSIS — E785 Hyperlipidemia, unspecified: Secondary | ICD-10-CM

## 2015-07-08 DIAGNOSIS — E669 Obesity, unspecified: Secondary | ICD-10-CM | POA: Diagnosis not present

## 2015-07-08 DIAGNOSIS — E119 Type 2 diabetes mellitus without complications: Secondary | ICD-10-CM | POA: Diagnosis not present

## 2015-07-08 DIAGNOSIS — I10 Essential (primary) hypertension: Secondary | ICD-10-CM | POA: Diagnosis not present

## 2015-07-08 DIAGNOSIS — E1169 Type 2 diabetes mellitus with other specified complication: Secondary | ICD-10-CM

## 2015-07-08 NOTE — Progress Notes (Signed)
Location:  [facility] clinic Provider:  Kirbi Farrugia L. Mariea Clonts, D.O., C.M.D.  Code Status: full code Goals of Care:  Advanced Directives 07/08/2015  Does patient have an advance directive? No  Would patient like information on creating an advanced directive? -  has been discussed, but she has yet to do this   Chief Complaint  Patient presents with  . Medical Management of Chronic Issues    3 mth follow-up    HPI: Patient is a 70 y.o. female seen today for medical management of chronic diseases.    She had cataract surgery earlier this month.  Pressure medicine was only working slightly, but had stent put in and glaucoma doing better.  Is going to have her left eye done in July.  Has an appt with Dr. Katy Fitch on 7/7.    Continues to walk with a cane.  Hands ache.     Continues to smoke--covers her eyes.  Still smoking.  About the same about.    Sugar average still around 6.    Hyperlipidemia:  Discussed cutting back on cheese, meat and eggs.    Has been walking more--close to 30 mins per day.  Still riding her bike stationary.    Past Medical History  Diagnosis Date  . Unspecified vitamin D deficiency   . Obesity, unspecified   . Goiter, specified as simple   . Pernicious anemia   . Hyperlipidemia LDL goal < 100   . Unspecified glaucoma   . Benign essential hypertension   . Diverticulitis of colon (without mention of hemorrhage)   . Osteoarthrosis, unspecified whether generalized or localized, lower leg   . Osteopenia   . Numbness     TOES RT FOOT and toes left foot  . Diabetes mellitus without complication ([place])   . Other specified disease of nail     FUNGUS OF FINGERNAILS    Past Surgical History  Procedure Laterality Date  . Total knee arthroplasty Right 02/01/2013    Procedure: RIGHT TOTAL KNEE ARTHROPLASTY;  Surgeon: Tobi Bastos, MD;  Location: [facility];  Service: Orthopedics;  Laterality: Right;  . Colectomy  2009  . Lumbar laminectomy/decompression microdiscectomy  N/A 03/30/2013    Procedure: CENTRAL DECOMPRESSION L4 - L5 AND EXCISION 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;  Surgeon: Tobi Bastos, MD;  Location: [facility];  Service: Orthopedics;  Laterality: N/A;  . Back surgery    . Joint replacement      right  . Total knee arthroplasty Left 04/18/2014    Procedure: LEFT TOTAL KNEE ARTHROPLASTY;  Surgeon: Latanya Maudlin, MD;  Location: [facility];  Service: Orthopedics;  Laterality: Left;    Allergies  Allergen Reactions  . Aspirin Nausea Only    Can take 81 mg but can't take 325mg       Medication List       This list is accurate as of: 07/08/15 10:40 AM.  Always use your most recent med list.               aspirin EC 81 MG tablet  Take 81 mg by mouth daily.     carboxymethylcellulose 0.5 % Soln  Commonly known as:  REFRESH PLUS  Place 1 drop into both eyes 3 (three) times daily as needed (dry eyes).     ferrous sulfate 325 (65 FE) MG tablet  Take 1 tablet (325 mg total) by mouth 2 (two) times daily with a meal.     lansoprazole 30 MG capsule  Commonly known  as:  PREVACID  Take 1 capsule (30 mg total) by mouth daily at 12 noon.     latanoprost 0.005 % ophthalmic solution  Commonly known as:  XALATAN  INSTILL 1 DROP IN BOTH EYES EVERY DAY AT BEDTIME     lisinopril 5 MG tablet  Commonly known as:  PRINIVIL,ZESTRIL  TAKE 1 TABLET BY MOUTH DAILY     metFORMIN 500 MG tablet  Commonly known as:  GLUCOPHAGE  TAKE 1/2 TABLET BY MOUTH EVERY EVENING AT SUPPER     multivitamin tablet  Take 1 tablet by mouth daily.     omega-3 acid ethyl esters 1 g capsule  Commonly known as:  LOVAZA  Take 1 g by mouth daily.     Oyster Shell Calcium/D 250-125 MG-UNIT Tabs  Take 1 tablet by mouth.     pravastatin 80 MG tablet  Commonly known as:  PRAVACHOL  TAKE 1 TABLET BY MOUTH EVERY MORNING     prednisoLONE acetate 1 % ophthalmic suspension  Commonly known as:  PRED FORTE  Place 1 drop into the right eye 4 (four) times daily.     Vitamin D-3 1000  units Caps  Take by mouth daily.        Review of Systems:  Review of Systems  Constitutional: Negative for fever and chills.  HENT: Negative for congestion.   Eyes:       Had right cataract surgery and duct stent placement  Respiratory: Negative for shortness of breath.   Cardiovascular: Negative for chest pain, palpitations and leg swelling.  Gastrointestinal: Negative for abdominal pain, blood in stool and melena.  Genitourinary: Negative for dysuria.  Musculoskeletal: Positive for joint pain. Negative for falls.       Hands bothersome now  Skin: Negative for rash.  Neurological: Positive for tingling. Negative for dizziness and loss of consciousness.       Right index finger--feels like a burn, but no injury known  Endo/Heme/Allergies: Does not bruise/bleed easily.  Psychiatric/Behavioral: Negative for depression and memory loss. The patient is not nervous/anxious and does not have insomnia.     Health Maintenance  Topic Date Due  . Hepatitis C Screening  [birth year]  . INFLUENZA VACCINE  08/13/2015  . FOOT EXAM  09/21/2015  . HEMOGLOBIN A1C  01/04/2016  . OPHTHALMOLOGY EXAM  05/08/2016  . MAMMOGRAM  04/07/2017  . COLONOSCOPY  09/08/2017  . TETANUS/TDAP  10/29/2023  . DEXA SCAN  Completed  . ZOSTAVAX  Completed  . PNA vac Low Risk Adult  Completed    Physical Exam: Filed Vitals:   07/08/15 1001  BP: 138/78  Pulse: 82  Temp: 98.5 F (36.9 C)  TempSrc: Oral  Height: 5\' 6"  (1.676 m)  Weight: 208 lb (94.348 kg)  SpO2: 95%   Body mass index is 33.59 kg/(m^2). Physical Exam  Constitutional: She is oriented to person, place, and time. She appears well-developed and well-nourished. No distress.  Eyes:  Wearing glasses with only left lens in place  Cardiovascular: Normal rate, regular rhythm, normal heart sounds and intact distal pulses.   Pulmonary/Chest: Effort normal and breath sounds normal. No respiratory distress.  Abdominal: Soft. Bowel sounds are normal.  She exhibits no distension and no mass. There is no tenderness.  Musculoskeletal:  Walking with cane  Neurological: She is alert and oriented to person, place, and time. No cranial nerve deficit.  Skin: Skin is warm and dry.  Psychiatric: She has a normal mood and affect.    Labs reviewed:  Basic Metabolic Panel:  Recent Labs  09/21/14 1046 03/29/15 0814 07/05/15 0831  NA 137 142 138  K 4.7 4.4 4.4  CL 98 102 98  CO2 22 25 24   GLUCOSE 102* 96 101*  BUN 16 11 11   CREATININE 0.78 0.75 0.74  CALCIUM 10.3 9.9 9.9   Liver Function Tests:  Recent Labs  07/27/14 1117 09/21/14 1046 03/29/15 0814  AST 16 16 17   ALT 14 16 12   ALKPHOS 75 78 60  BILITOT 0.3 0.2 0.3  PROT 7.5 7.3 6.8  ALBUMIN 4.3 4.8 4.3    Recent Labs  07/27/14 1117  LIPASE 28  AMYLASE 54   No results for input(s): AMMONIA in the last 8760 hours. CBC:  Recent Labs  07/27/14 1131 09/21/14 1046 03/29/15 0814  WBC 13.9* 12.6* 9.8  NEUTROABS  --  9.9* 6.5  HGB 12.1*  --   --   HCT 36.5* 40.1 37.9  MCV 80.6 87 88  PLT  --  355 361   Lipid Panel:  Recent Labs  12/21/14 0836 03/29/15 0814 07/05/15 0831  CHOL 157 161 156  HDL 39* 37* 39*  LDLCALC 101* 98 97  TRIG 83 129 99  CHOLHDL 4.0 4.4 4.0   Lab Results  Component Value Date   HGBA1C 6.0* 07/05/2015    Assessment/Plan 1. Type 2 diabetes mellitus without complication, without long-term current use of insulin (HCC) - is taking her ace, statin, asa  - cont diet and exercise,  Metformin  - Hemoglobin A1c; Future  2. Benign essential hypertension - bp at goal with current therapy, cont same including ace - Basic metabolic panel; Future  3. Obesity, unspecified - has not lost weight to meet initial goal of less than 200 -cont increased exercise, but work on diet too - Lipid panel; Future  4. Hyperlipidemia associated with type 2 diabetes mellitus ([place]) - cont to work on diet and exercise -I want to change her statin from  pravachol 80 to lipitor, but that was previously not affordable -she is going to work on reducing cheese in her diet - Lipid panel; Future  5. Tobacco abuse -ongoing smoking, same amount  6. Cataracts, bilateral - had right eye cataract surgery/lens implant and no longer needs glasses for that eye  7. Ocular hypertension, bilateral - she reports not true glaucoma--had stent placed in right eye and improved pressures now  8. Thyroid goiter - needs f/u lab - TSH; Future  Labs/tests ordered:   Orders Placed This Encounter  Procedures  . TSH    Standing Status: Future     Number of Occurrences:      Standing Expiration Date: 03/09/2016  . Hemoglobin A1c    Standing Status: Future     Number of Occurrences:      Standing Expiration Date: 03/09/2016  . Lipid panel    Standing Status: Future     Number of Occurrences:      Standing Expiration Date: 03/09/2016    Order Specific Question:  Has the patient fasted?    Answer:  Yes  . Basic metabolic panel    Standing Status: Future     Number of Occurrences:      Standing Expiration Date: 03/09/2016    Order Specific Question:  Has the patient fasted?    Answer:  Yes    Next appt:  11/07/2015  Gianno Volner L. Shimeka Bacot, D.O. [facility] [address], [place] [address] Cell Phone (  Mon-Fri 8am-5pm):  [phone] On Call:  [phone] & follow prompts after 5pm & weekends Office Phone:  [phone] Office Fax:  [phone]

## 2015-07-08 NOTE — Patient Instructions (Signed)
Cut back on cheese, meat, eggs, butter.   Continue the pravachol 80mg , your walking and riding the bike. Drink more water. Ask Dr. Katy Fitch to send me his notes. You have arthritis in your hand.

## 2015-08-25 ENCOUNTER — Other Ambulatory Visit: Payer: Self-pay | Admitting: Internal Medicine

## 2015-08-25 DIAGNOSIS — K219 Gastro-esophageal reflux disease without esophagitis: Secondary | ICD-10-CM

## 2015-09-04 DIAGNOSIS — H2512 Age-related nuclear cataract, left eye: Secondary | ICD-10-CM | POA: Diagnosis not present

## 2015-09-04 NOTE — Addendum Note (Signed)
Addended by: Moshe Cipro Toi Stelly A on: 09/04/2015 02:28 PM   Modules accepted: Orders

## 2015-09-09 ENCOUNTER — Other Ambulatory Visit: Payer: Self-pay | Admitting: Internal Medicine

## 2015-09-12 DIAGNOSIS — H2512 Age-related nuclear cataract, left eye: Secondary | ICD-10-CM | POA: Diagnosis not present

## 2015-09-12 DIAGNOSIS — H401122 Primary open-angle glaucoma, left eye, moderate stage: Secondary | ICD-10-CM | POA: Diagnosis not present

## 2015-09-12 DIAGNOSIS — H401121 Primary open-angle glaucoma, left eye, mild stage: Secondary | ICD-10-CM | POA: Diagnosis not present

## 2015-09-13 IMAGING — CR DG SPINE 1V PORT
1 series · 1 of 1 positions shown · non-contrast
Comparison: DG LUMBAR SPINE 2-3 VIEWS dated 03/30/2013

CLINICAL DATA: Spine localization levels centered at L4-5

EXAM:
PORTABLE SPINE - 1 VIEW

[lateral]
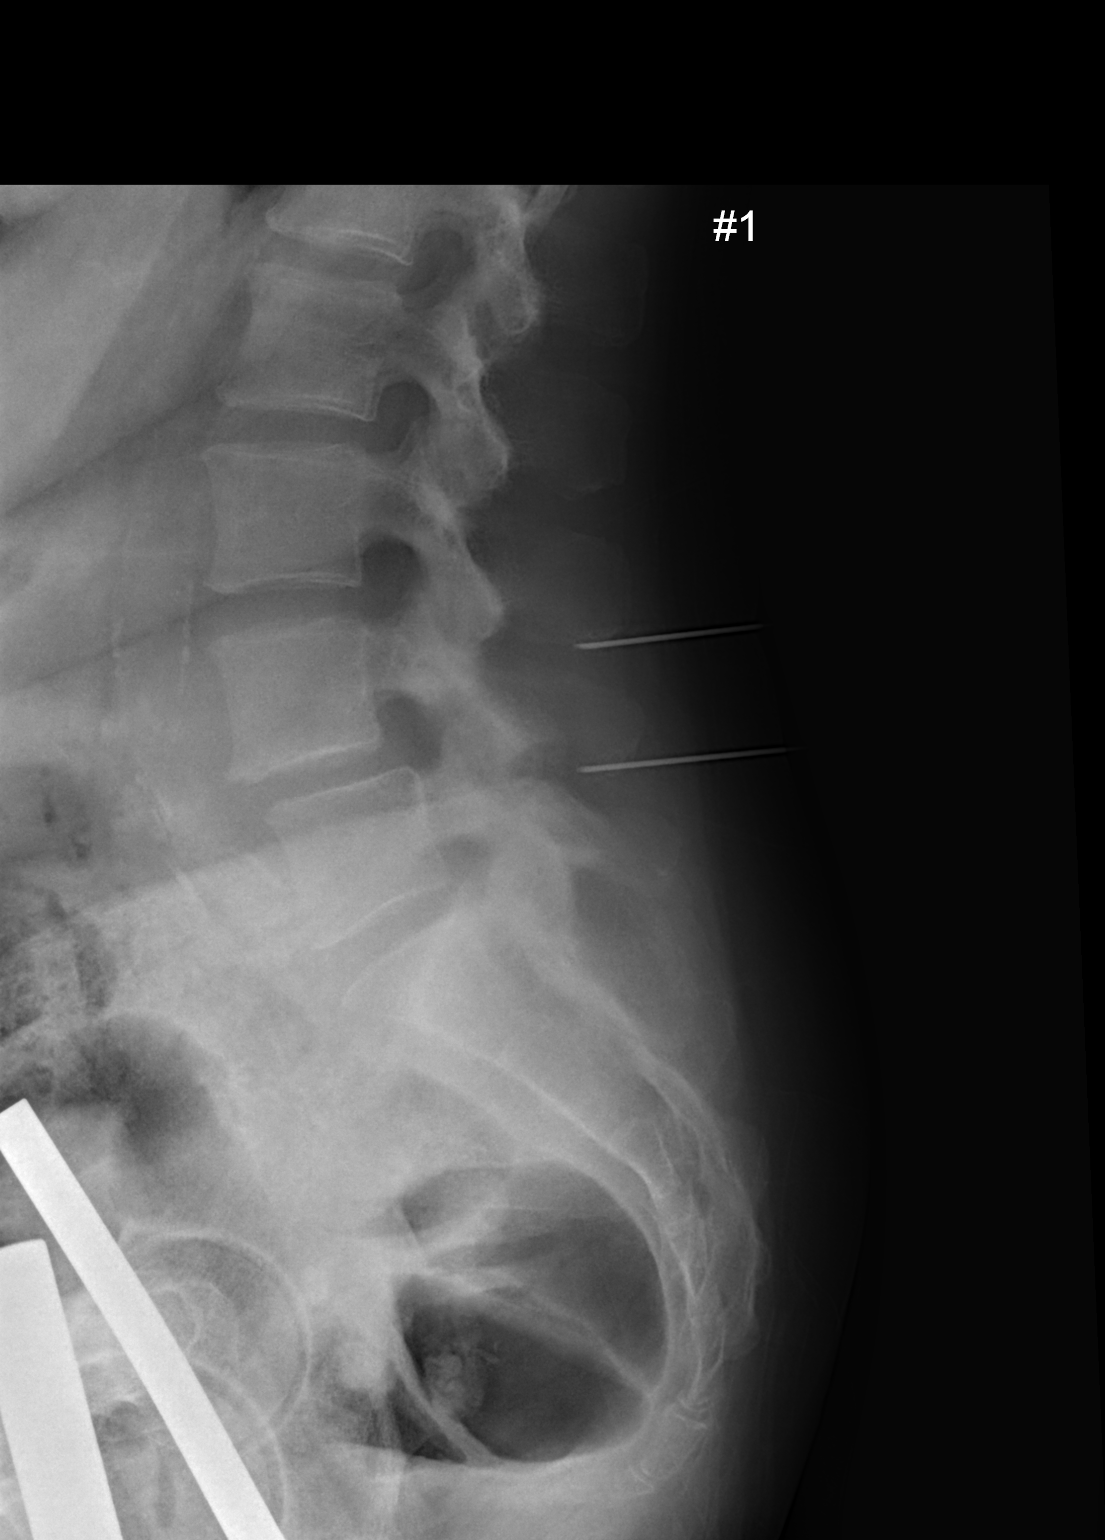

[1 of 1 positions shown; findings below may reference images not displayed]

FINDINGS: The metallic needle localization devices project posterior to the
body of L4 and posterior to the body of L5 bracketing the L4-5 disc
level. There is minimal grade 1 anterolisthesis of L4 with respect
to L5.
IMPRESSION: The mid metallic needle localization devices project posterior to
the bodies of L4 and L5.

## 2015-10-25 ENCOUNTER — Encounter: Payer: Self-pay | Admitting: Internal Medicine

## 2015-10-25 LAB — HM DIABETES EYE EXAM

## 2015-10-27 ENCOUNTER — Other Ambulatory Visit: Payer: Self-pay | Admitting: Internal Medicine

## 2015-11-06 ENCOUNTER — Other Ambulatory Visit: Payer: Medicare Other

## 2015-11-06 DIAGNOSIS — E049 Nontoxic goiter, unspecified: Secondary | ICD-10-CM | POA: Diagnosis not present

## 2015-11-06 DIAGNOSIS — E119 Type 2 diabetes mellitus without complications: Secondary | ICD-10-CM

## 2015-11-06 DIAGNOSIS — E669 Obesity, unspecified: Secondary | ICD-10-CM

## 2015-11-06 DIAGNOSIS — E785 Hyperlipidemia, unspecified: Secondary | ICD-10-CM

## 2015-11-06 DIAGNOSIS — I10 Essential (primary) hypertension: Secondary | ICD-10-CM

## 2015-11-06 DIAGNOSIS — E1169 Type 2 diabetes mellitus with other specified complication: Secondary | ICD-10-CM | POA: Diagnosis not present

## 2015-11-06 LAB — BASIC METABOLIC PANEL
BUN: 13 mg/dL (ref 7–25)
CO2: 25 mmol/L (ref 20–31)
Calcium: 9.6 mg/dL (ref 8.6–10.4)
Chloride: 108 mmol/L (ref 98–110)
Creat: 0.95 mg/dL — ABNORMAL HIGH (ref 0.60–0.93)
Glucose, Bld: 103 mg/dL — ABNORMAL HIGH (ref 65–99)
Potassium: 4.3 mmol/L (ref 3.5–5.3)
Sodium: 140 mmol/L (ref 135–146)

## 2015-11-06 LAB — LIPID PANEL
Cholesterol: 158 mg/dL (ref 125–200)
HDL: 32 mg/dL — ABNORMAL LOW (ref >=46)
LDL Cholesterol: 102 mg/dL (ref <130)
Total CHOL/HDL Ratio: 4.9 Ratio (ref <=5.0)
Triglycerides: 120 mg/dL (ref <150)
VLDL: 24 mg/dL (ref <30)

## 2015-11-06 LAB — TSH: TSH: 1.08 mIU/L

## 2015-11-07 ENCOUNTER — Ambulatory Visit (INDEPENDENT_AMBULATORY_CARE_PROVIDER_SITE_OTHER): Payer: Medicare Other | Admitting: Internal Medicine

## 2015-11-07 ENCOUNTER — Encounter: Payer: Self-pay | Admitting: Internal Medicine

## 2015-11-07 VITALS — BP 132/80 | HR 88 | Temp 97.8°F | Ht 66.0 in | Wt 211.0 lb

## 2015-11-07 DIAGNOSIS — Z1159 Encounter for screening for other viral diseases: Secondary | ICD-10-CM

## 2015-11-07 DIAGNOSIS — Z720 Tobacco use: Secondary | ICD-10-CM | POA: Diagnosis not present

## 2015-11-07 DIAGNOSIS — Z23 Encounter for immunization: Secondary | ICD-10-CM | POA: Diagnosis not present

## 2015-11-07 DIAGNOSIS — H40053 Ocular hypertension, bilateral: Secondary | ICD-10-CM

## 2015-11-07 DIAGNOSIS — I10 Essential (primary) hypertension: Secondary | ICD-10-CM | POA: Diagnosis not present

## 2015-11-07 DIAGNOSIS — E1169 Type 2 diabetes mellitus with other specified complication: Secondary | ICD-10-CM | POA: Diagnosis not present

## 2015-11-07 DIAGNOSIS — E119 Type 2 diabetes mellitus without complications: Secondary | ICD-10-CM | POA: Diagnosis not present

## 2015-11-07 DIAGNOSIS — E049 Nontoxic goiter, unspecified: Secondary | ICD-10-CM | POA: Diagnosis not present

## 2015-11-07 DIAGNOSIS — E785 Hyperlipidemia, unspecified: Secondary | ICD-10-CM | POA: Diagnosis not present

## 2015-11-07 LAB — HEMOGLOBIN A1C
Hgb A1c MFr Bld: 5.6 % (ref <5.7)
Mean Plasma Glucose: 114 mg/dL

## 2015-11-07 NOTE — Progress Notes (Signed)
Location:  [facility] clinic Provider:  Wilder Kurowski L. Mariea Clonts, D.O., C.M.D.  Code Status: full code Goals of Care:  Advanced Directives 11/07/2015  Does patient have an advance directive? No  Would patient like information on creating an advanced directive? No - patient declined information  Pre-existing out of facility DNR order (yellow form or pink MOST form) -     Chief Complaint  Patient presents with  . Medical Management of Chronic Issues    4 mth follow-up    HPI: Patient is a 70 y.o. female seen today for medical management of chronic diseases.    TSH normal. Done due to goiter.  Hba1c was 5.6.  This is good, but she has actually developed some new bad habits below.  On steroid eye drops since July.  Keeps getting changed.  Now bid in both eyes for inflammation that they are trying to improve.  I have not gotten any of Dr. Zenia Resides notes.  No retinopathy.  Pressure in eyes has come down.  She got a stent placed in each eye.  Pressure was really good two weeks ago after that.  15 and 16 on 10/13 at Dr. Garlan Fillers.  Since cataract surgery, can read w/o her glasses.  Still not able to see well at distance w/o glasses.    Cholesterol went up.  Had more meat instead of cheese.  Not even buying the cheese.  Also chocolate chip cookies made a comeback.  Will have a few at a time, but goes back for more.    Still smoking her cigarettes--slightly more lately.  Is in the house most of the time.  Also is up three lbs from immobility.    Has had frequent BMs ever since her colectomy for diverticular bleeding.  Not different with metformin.  No trouble with GERD lately--uses the prevacid as needed and not a problem.  Past Medical History:  Diagnosis Date  . Benign essential hypertension   . Diabetes mellitus without complication ([place])   . Diverticulitis of colon (without mention of hemorrhage)(562.11)   . Goiter, specified as simple   . Hyperlipidemia LDL goal < 100   . Numbness    TOES RT  FOOT and toes left foot  . Obesity, unspecified   . Osteoarthrosis, unspecified whether generalized or localized, lower leg   . Osteopenia   . Other specified disease of nail    FUNGUS OF FINGERNAILS  . Pernicious anemia   . Unspecified glaucoma(365.9)   . Unspecified vitamin D deficiency     Past Surgical History:  Procedure Laterality Date  . BACK SURGERY    . COLECTOMY  2009  . JOINT REPLACEMENT     right  . LUMBAR LAMINECTOMY/DECOMPRESSION MICRODISCECTOMY N/A 03/30/2013   Procedure: CENTRAL DECOMPRESSION L4 - L5 AND EXCISION 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;  Surgeon: Tobi Bastos, MD;  Location: [facility];  Service: Orthopedics;  Laterality: N/A;  . TOTAL KNEE ARTHROPLASTY Right 02/01/2013   Procedure: RIGHT TOTAL KNEE ARTHROPLASTY;  Surgeon: Tobi Bastos, MD;  Location: [facility];  Service: Orthopedics;  Laterality: Right;  . TOTAL KNEE ARTHROPLASTY Left 04/18/2014   Procedure: LEFT TOTAL KNEE ARTHROPLASTY;  Surgeon: Latanya Maudlin, MD;  Location: [facility];  Service: Orthopedics;  Laterality: Left;    Allergies  Allergen Reactions  . Aspirin Nausea Only    Can take 81 mg but can't take 325mg       Medication List       Accurate as of 11/07/15  8:41 AM. Always use your most recent med list.          aspirin EC 81 MG tablet Take 81 mg by mouth daily.   carboxymethylcellulose 0.5 % Soln Commonly known as:  REFRESH PLUS Place 1 drop into both eyes 3 (three) times daily as needed (dry eyes).   ferrous sulfate 325 (65 FE) MG tablet Take 1 tablet (325 mg total) by mouth 2 (two) times daily with a meal.   lansoprazole 30 MG capsule Commonly known as:  PREVACID TAKE 1 CAPSULE BY MOUTH DAILY AT 12 NOON   lisinopril 5 MG tablet Commonly known as:  PRINIVIL,ZESTRIL TAKE 1 TABLET BY MOUTH DAILY   metFORMIN 500 MG tablet Commonly known as:  GLUCOPHAGE TAKE 1/2 TABLET BY MOUTH EVERY EVENING AT SUPPER   multivitamin tablet Take 1 tablet by mouth daily.   omega-3 acid  ethyl esters 1 g capsule Commonly known as:  LOVAZA Take 1 g by mouth daily.   Oyster Shell Calcium/D 250-125 MG-UNIT Tabs Take 1 tablet by mouth.   pravastatin 80 MG tablet Commonly known as:  PRAVACHOL TAKE 1 TABLET BY MOUTH EVERY MORNING   prednisoLONE acetate 1 % ophthalmic suspension Commonly known as:  PRED FORTE Place 1 drop into both eyes 2 (two) times daily.   Vitamin D-3 1000 units Caps Take by mouth daily.       Review of Systems:  Review of Systems  Constitutional: Negative for chills, fever and malaise/fatigue.       Weight gain  HENT: Negative for hearing loss.   Eyes: Negative for blurred vision.  Respiratory: Negative for cough and shortness of breath.   Cardiovascular: Negative for chest pain, palpitations and leg swelling.  Gastrointestinal: Negative for abdominal pain, blood in stool, constipation, diarrhea, heartburn and melena.  Genitourinary: Negative for dysuria.  Musculoskeletal: Negative for falls and joint pain.  Skin: Negative for itching and rash.  Neurological: Negative for dizziness, loss of consciousness and weakness.  Endo/Heme/Allergies: Does not bruise/bleed easily.  Psychiatric/Behavioral: Negative for depression and memory loss. The patient is not nervous/anxious and does not have insomnia.     Health Maintenance  Topic Date Due  . Hepatitis C Screening  December 22, 1945  . INFLUENZA VACCINE  08/13/2015  . FOOT EXAM  09/21/2015  . HEMOGLOBIN A1C  05/06/2016  . OPHTHALMOLOGY EXAM  05/08/2016  . MAMMOGRAM  04/07/2017  . COLONOSCOPY  09/08/2017  . TETANUS/TDAP  10/29/2023  . DEXA SCAN  Completed  . ZOSTAVAX  Completed  . PNA vac Low Risk Adult  Completed    Physical Exam: Vitals:   11/07/15 0829  BP: 132/80  Pulse: 88  Temp: 97.8 F (36.6 C)  TempSrc: Oral  Weight: 211 lb (95.7 kg)  Height: 5\' 6"  (1.676 m)   Body mass index is 34.06 kg/m. Physical Exam  Constitutional: She is oriented to person, place, and time. She appears  well-developed and well-nourished. No distress.  See foot exam  Cardiovascular: Normal rate, regular rhythm, normal heart sounds and intact distal pulses.   Pulmonary/Chest: Effort normal and breath sounds normal. No respiratory distress.  Abdominal: Soft. Bowel sounds are normal. She exhibits no distension. There is no tenderness.  Musculoskeletal: Normal range of motion.  Neurological: She is alert and oriented to person, place, and time.  Skin: Skin is warm and dry. Capillary refill takes less than 2 seconds.  Psychiatric: She has a normal mood and affect.    Labs reviewed: Basic Metabolic Panel:  Recent Labs  03/29/15 0814 07/05/15 0831 11/06/15 0816  NA 142 138 140  K 4.4 4.4 4.3  CL 102 98 108  CO2 25 24 25   GLUCOSE 96 101* 103*  BUN 11 11 13   CREATININE 0.75 0.74 0.95*  CALCIUM 9.9 9.9 9.6  TSH  --   --  1.08   Liver Function Tests:  Recent Labs  03/29/15 0814  AST 17  ALT 12  ALKPHOS 60  BILITOT 0.3  PROT 6.8  ALBUMIN 4.3   No results for input(s): LIPASE, AMYLASE in the last 8760 hours. No results for input(s): AMMONIA in the last 8760 hours. CBC:  Recent Labs  03/29/15 0814  WBC 9.8  NEUTROABS 6.5  HCT 37.9  MCV 88  PLT 361   Lipid Panel:  Recent Labs  03/29/15 0814 07/05/15 0831 11/06/15 0816  CHOL 161 156 158  HDL 37* 39* 32*  LDLCALC 98 97 102  TRIG 129 99 120  CHOLHDL 4.4 4.0 4.9   Lab Results  Component Value Date   HGBA1C 5.6 11/06/2015    Assessment/Plan 1. Type 2 diabetes mellitus without complication, without long-term current use of insulin (HCC) - cont metformin at supper, working on diet, still does not exercise despite counseling - CBC with Differential/Platelet; Future - COMPLETE METABOLIC PANEL WITH GFR; Future - Hemoglobin A1c; Future - Lipid panel; Future  2. Benign essential hypertension - bp satisfactory now, cont same regimen including ace - CBC with Differential/Platelet; Future  3. Hyperlipidemia  associated with type 2 diabetes mellitus ([place]) - cont pravachol and working on dietary changes - Lipid panel; Future  4. Tobacco abuse -not ready for smoking cessation but was counseled on this  5. Thyroid goiter -TSH was wnl today  6. Ocular hypertension, bilateral -pressures in eyes improved, need notes from Dr. Katy Fitch and Dr. Shirley Muscat re: reasoning for prednisone drops and latest diagnoses  7. Need for hepatitis C screening test - Hep C Antibody; Future  8. Need for immunization against influenza - flu shot given  Labs/tests ordered:   Orders Placed This Encounter  Procedures  . CBC with Differential/Platelet    Standing Status:   Future    Standing Expiration Date:   05/07/2016  . COMPLETE METABOLIC PANEL WITH GFR    [facility]    Standing Status:   Future    Standing Expiration Date:   05/07/2016  . Hemoglobin A1c    Standing Status:   Future    Standing Expiration Date:   05/07/2016  . Lipid panel    Standing Status:   Future    Standing Expiration Date:   05/07/2016    Order Specific Question:   Has the patient fasted?    Answer:   Yes  . Hep C Antibody    Standing Status:   Future    Standing Expiration Date:   05/07/2016    Next appt:  3 mos with labs before  Dolora Ridgely L. Harleigh Civello, D.O. [facility] [address], [place] [address] Cell Phone (Mon-Fri 8am-5pm):  [phone] On Call:  [phone] & follow prompts after 5pm & weekends Office Phone:  [phone] Office Fax:  [phone]

## 2015-12-12 ENCOUNTER — Other Ambulatory Visit: Payer: Self-pay | Admitting: Internal Medicine

## 2015-12-17 DIAGNOSIS — Z961 Presence of intraocular lens: Secondary | ICD-10-CM | POA: Diagnosis not present

## 2015-12-17 DIAGNOSIS — H401131 Primary open-angle glaucoma, bilateral, mild stage: Secondary | ICD-10-CM | POA: Diagnosis not present

## 2015-12-17 DIAGNOSIS — H31091 Other chorioretinal scars, right eye: Secondary | ICD-10-CM | POA: Diagnosis not present

## 2016-01-08 ENCOUNTER — Other Ambulatory Visit: Payer: Self-pay | Admitting: Internal Medicine

## 2016-01-08 DIAGNOSIS — E1142 Type 2 diabetes mellitus with diabetic polyneuropathy: Secondary | ICD-10-CM

## 2016-01-27 ENCOUNTER — Other Ambulatory Visit: Payer: Self-pay | Admitting: *Deleted

## 2016-01-27 MED ORDER — LISINOPRIL 5 MG PO TABS: 5.0000 mg | ORAL_TABLET | Freq: Every day | ORAL | 1 refills | Status: DC

## 2016-01-27 NOTE — Telephone Encounter (Signed)
Patient called and requested a pharmacy change to [facility] Faxed Rx

## 2016-01-28 DIAGNOSIS — H31091 Other chorioretinal scars, right eye: Secondary | ICD-10-CM | POA: Diagnosis not present

## 2016-01-28 DIAGNOSIS — Z961 Presence of intraocular lens: Secondary | ICD-10-CM | POA: Diagnosis not present

## 2016-01-28 DIAGNOSIS — H401131 Primary open-angle glaucoma, bilateral, mild stage: Secondary | ICD-10-CM | POA: Diagnosis not present

## 2016-02-03 ENCOUNTER — Other Ambulatory Visit: Payer: Medicare Other

## 2016-02-03 ENCOUNTER — Ambulatory Visit (INDEPENDENT_AMBULATORY_CARE_PROVIDER_SITE_OTHER): Payer: Medicare Other

## 2016-02-03 VITALS — BP 138/86 | HR 81 | Temp 98.2°F | Ht 66.0 in | Wt 209.4 lb

## 2016-02-03 DIAGNOSIS — I10 Essential (primary) hypertension: Secondary | ICD-10-CM

## 2016-02-03 DIAGNOSIS — Z0000 Encounter for general adult medical examination without abnormal findings: Secondary | ICD-10-CM | POA: Diagnosis not present

## 2016-02-03 DIAGNOSIS — E119 Type 2 diabetes mellitus without complications: Secondary | ICD-10-CM

## 2016-02-03 DIAGNOSIS — Z1159 Encounter for screening for other viral diseases: Secondary | ICD-10-CM

## 2016-02-03 DIAGNOSIS — E785 Hyperlipidemia, unspecified: Secondary | ICD-10-CM

## 2016-02-03 DIAGNOSIS — E1169 Type 2 diabetes mellitus with other specified complication: Secondary | ICD-10-CM

## 2016-02-03 LAB — HEPATITIS C ANTIBODY: HCV Ab: NEGATIVE

## 2016-02-03 LAB — COMPLETE METABOLIC PANEL WITH GFR
ALT: 12 U/L (ref 6–29)
AST: 15 U/L (ref 10–35)
Albumin: 4.4 g/dL (ref 3.6–5.1)
Alkaline Phosphatase: 61 U/L (ref 33–130)
BUN: 9 mg/dL (ref 7–25)
CO2: 27 mmol/L (ref 20–31)
Calcium: 9.9 mg/dL (ref 8.6–10.4)
Chloride: 106 mmol/L (ref 98–110)
Creat: 0.8 mg/dL (ref 0.60–0.93)
GFR, Est African American: 86 mL/min (ref >=60)
GFR, Est Non African American: 74 mL/min (ref >=60)
Glucose, Bld: 112 mg/dL — ABNORMAL HIGH (ref 65–99)
Potassium: 4.4 mmol/L (ref 3.5–5.3)
Sodium: 140 mmol/L (ref 135–146)
Total Bilirubin: 0.5 mg/dL (ref 0.2–1.2)
Total Protein: 7.2 g/dL (ref 6.1–8.1)

## 2016-02-03 LAB — LIPID PANEL
Cholesterol: 150 mg/dL (ref <200)
HDL: 32 mg/dL — ABNORMAL LOW (ref >50)
LDL Cholesterol: 83 mg/dL (ref <100)
Total CHOL/HDL Ratio: 4.7 Ratio (ref <5.0)
Triglycerides: 177 mg/dL — ABNORMAL HIGH (ref <150)
VLDL: 35 mg/dL — ABNORMAL HIGH (ref <30)

## 2016-02-03 LAB — CBC WITH DIFFERENTIAL/PLATELET
Basophils Absolute: 0 cells/uL (ref 0–200)
Basophils Relative: 0 %
Eosinophils Absolute: 162 cells/uL (ref 15–500)
Eosinophils Relative: 2 %
HCT: 38.9 % (ref 35.0–45.0)
Hemoglobin: 12.6 g/dL (ref 11.7–15.5)
Lymphocytes Relative: 22 %
Lymphs Abs: 1782 cells/uL (ref 850–3900)
MCH: 28.9 pg (ref 27.0–33.0)
MCHC: 32.4 g/dL (ref 32.0–36.0)
MCV: 89.2 fL (ref 80.0–100.0)
MPV: 9.6 fL (ref 7.5–12.5)
Monocytes Absolute: 648 cells/uL (ref 200–950)
Monocytes Relative: 8 %
Neutro Abs: 5508 cells/uL (ref 1500–7800)
Neutrophils Relative %: 68 %
Platelets: 344 10*3/uL (ref 140–400)
RBC: 4.36 MIL/uL (ref 3.80–5.10)
RDW: 13.5 % (ref 11.0–15.0)
WBC: 8.1 10*3/uL (ref 3.8–10.8)

## 2016-02-03 LAB — HEMOGLOBIN A1C
Hgb A1c MFr Bld: 5.7 % — ABNORMAL HIGH (ref <5.7)
Mean Plasma Glucose: 117 mg/dL

## 2016-02-03 NOTE — Progress Notes (Signed)
   I reviewed health advisor's note, was available for consultation and agree with the assessment and plan as written.  She had reviewed the hep C screen due to her low risk as well as her potential out of pocket cost of $90.    Lindley Hiney L. Talaya Lamprecht, D.O. [facility] [address], [place] [address] Cell Phone (Mon-Fri 8am-5pm):  [phone] On Call:  [phone] & follow prompts after 5pm & weekends Office Phone:  [phone] Office Fax:  [phone]   Quick Notes   Health Maintenance:  Due for Hep C;    Abnormal Screen: None; MMSE-29/30, Passed clock test    Patient Concerns:  None    Nurse Concerns:  None

## 2016-02-03 NOTE — Patient Instructions (Addendum)
Ms. Wythe , Thank you for taking time to come for your Medicare Wellness Visit. I appreciate your ongoing commitment to your health goals. Please review the following plan we discussed and let me know if I can assist you in the future.   These are the goals we discussed: Goals    . Quit smoking / using tobacco    . Weight < 200 lb (90.719 kg)          Try to get back down below 200 lbs for next visit.  Continue cycling and try to eat better (with handouts provided).       This is a list of the screening recommended for you and due dates:  Health Maintenance  Topic Date Due  .  Hepatitis C: One time screening is recommended by Center for Disease Control  (CDC) for  adults born from [birth year] through 1966.   10-03-1945  . Hemoglobin A1C  05/06/2016  . Eye exam for diabetics  10/24/2016  . Complete foot exam   11/06/2016  . Mammogram  04/07/2017  . Colon Cancer Screening  09/08/2017  . Tetanus Vaccine  10/29/2023  . Flu Shot  Completed  . DEXA scan (bone density measurement)  Completed  . Shingles Vaccine  Completed  . Pneumonia vaccines  Completed  Preventive Care for Adults  A healthy lifestyle and preventive care can promote health and wellness. Preventive health guidelines for adults include the following key practices.  . A routine yearly physical is a good way to check with your health care provider about your health and preventive screening. It is a chance to share any concerns and updates on your health and to receive a thorough exam.  . Visit your dentist for a routine exam and preventive care every 6 months. Brush your teeth twice a day and floss once a day. Good oral hygiene prevents tooth decay and gum disease.  . The frequency of eye exams is based on your age, health, family medical history, use  of contact lenses, and other factors. Follow your health care provider's ecommendations for frequency of eye exams.  . Eat a healthy diet. Foods like vegetables, fruits, whole  grains, low-fat dairy products, and lean protein foods contain the nutrients you need without too many calories. Decrease your intake of foods high in solid fats, added sugars, and salt. Eat the right amount of calories for you. Get information about a proper diet from your health care provider, if necessary.  . Regular physical exercise is one of the most important things you can do for your health. Most adults should get at least 150 minutes of moderate-intensity exercise (any activity that increases your heart rate and causes you to sweat) each week. In addition, most adults need muscle-strengthening exercises on 2 or more days a week.  Silver Sneakers may be a benefit available to you. To determine eligibility, you may visit the website: www.silversneakers.com or contact program at [phone] Mon-Fri between 8AM-8PM.   . Maintain a healthy weight. The body mass index (BMI) is a screening tool to identify possible weight problems. It provides an estimate of body fat based on height and weight. Your health care provider can find your BMI and can help you achieve or maintain a healthy weight.   For adults 20 years and older: ? A BMI below 18.5 is considered underweight. ? A BMI of 18.5 to 24.9 is normal. ? A BMI of 25 to 29.9 is considered overweight. ? A BMI of 30  and above is considered obese.   . Maintain normal blood lipids and cholesterol levels by exercising and minimizing your intake of saturated fat. Eat a balanced diet with plenty of fruit and vegetables. Blood tests for lipids and cholesterol should begin at age 33 and be repeated every 5 years. If your lipid or cholesterol levels are high, you are over 50, or you are at high risk for heart disease, you may need your cholesterol levels checked more frequently. Ongoing high lipid and cholesterol levels should be treated with medicines if diet and exercise are not working.  . If you smoke, find out from your health care provider how to  quit. If you do not use tobacco, please do not start.  . If you choose to drink alcohol, please do not consume more than 2 drinks per day. One drink is considered to be 12 ounces (355 mL) of beer, 5 ounces (148 mL) of wine, or 1.5 ounces (44 mL) of liquor.  . If you are 63-70 years old, ask your health care provider if you should take aspirin to prevent strokes.  . Use sunscreen. Apply sunscreen liberally and repeatedly throughout the day. You should seek shade when your shadow is shorter than you. Protect yourself by wearing long sleeves, pants, a wide-brimmed hat, and sunglasses year round, whenever you are outdoors.  . Once a month, do a whole body skin exam, using a mirror to look at the skin on your back. Tell your health care provider of new moles, moles that have irregular borders, moles that are larger than a pencil eraser, or moles that have changed in shape or color.

## 2016-02-03 NOTE — Progress Notes (Signed)
Subjective:   Alison [name] is a 71 y.o. female who presents for an Initial Medicare Annual Wellness Visit.  Review of Systems     Cardiac Risk Factors include: advanced age (>78men, >74 women);diabetes mellitus;dyslipidemia;family history of premature cardiovascular disease;obesity (BMI >30kg/m2);sedentary lifestyle;smoking/ tobacco exposure;hypertension     Objective:    Today's Vitals   02/03/16 0855 02/03/16 0856  BP: 138/86   Pulse: 81   Temp: 98.2 F (36.8 C)   TempSrc: Oral   SpO2: 98%   Weight: 209 lb 6.4 oz (95 kg)   Height: 5\' 6"  (1.676 m)   PainSc: 4  5    Body mass index is 33.8 kg/m.   Current Medications (verified) Outpatient Encounter Prescriptions as of 02/03/2016  Medication Sig  . aspirin EC 81 MG tablet Take 81 mg by mouth daily.  . Calcium Carbonate-Vitamin D (OYSTER SHELL CALCIUM/D) 250-125 MG-UNIT TABS Take 1 tablet by mouth.  . carboxymethylcellulose (REFRESH PLUS) 0.5 % SOLN Place 1 drop into both eyes 3 (three) times daily as needed (dry eyes).   . Cholecalciferol (VITAMIN D-3) 1000 UNITS CAPS Take by mouth daily.  . ferrous sulfate 325 (65 FE) MG tablet Take 1 tablet (325 mg total) by mouth 2 (two) times daily with a meal.  . lansoprazole (PREVACID) 30 MG capsule TAKE 1 CAPSULE BY MOUTH DAILY AT 12 NOON  . lisinopril (PRINIVIL,ZESTRIL) 5 MG tablet Take 1 tablet (5 mg total) by mouth daily.  . metFORMIN (GLUCOPHAGE) 500 MG tablet TAKE 1/2 TABLET BY MOUTH EVERY EVENING AT SUPPER  . Multiple Vitamin (MULTIVITAMIN) tablet Take 1 tablet by mouth daily.  Marland Kitchen omega-3 acid ethyl esters (LOVAZA) 1 g capsule Take 1 g by mouth daily.  . pravastatin (PRAVACHOL) 80 MG tablet TAKE 1 TABLET BY MOUTH EVERY MORNING  . prednisoLONE acetate (PRED FORTE) 1 % ophthalmic suspension Place 1 drop into both eyes 2 (two) times daily.    No facility-administered encounter medications on file as of 02/03/2016.     Allergies (verified) Aspirin   History: Past Medical  History:  Diagnosis Date  . Benign essential hypertension   . Diabetes mellitus without complication ([place])   . Diverticulitis of colon (without mention of hemorrhage)(562.11)   . Goiter, specified as simple   . Hyperlipidemia LDL goal < 100   . Numbness    TOES RT FOOT and toes left foot  . Obesity, unspecified   . Osteoarthrosis, unspecified whether generalized or localized, lower leg   . Osteopenia   . Other specified disease of nail    FUNGUS OF FINGERNAILS  . Pernicious anemia   . Unspecified glaucoma(365.9)   . Unspecified vitamin D deficiency    Past Surgical History:  Procedure Laterality Date  . BACK SURGERY    . CATARACT EXTRACTION     right eye 06/20/15 left eye 09/12/15  . COLECTOMY  2009  . JOINT REPLACEMENT     right  . LUMBAR LAMINECTOMY/DECOMPRESSION MICRODISCECTOMY N/A 03/30/2013   Procedure: CENTRAL DECOMPRESSION L4 - L5 AND EXCISION 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;  Surgeon: Tobi Bastos, MD;  Location: [facility];  Service: Orthopedics;  Laterality: N/A;  . TOTAL KNEE ARTHROPLASTY Right 02/01/2013   Procedure: RIGHT TOTAL KNEE ARTHROPLASTY;  Surgeon: Tobi Bastos, MD;  Location: [facility];  Service: Orthopedics;  Laterality: Right;  . TOTAL KNEE ARTHROPLASTY Left 04/18/2014   Procedure: LEFT TOTAL KNEE ARTHROPLASTY;  Surgeon: Latanya Maudlin, MD;  Location: [facility];  Service: Orthopedics;  Laterality:  Left;   Family History  Problem Relation Age of Onset  . Alzheimer's disease Mother   . Stroke Mother   . Hyperlipidemia Mother   . Heart disease Father   . Diabetes Sister    Social History   Occupational History  . Not on file.   Social History Main Topics  . Smoking status: Current Every Day Smoker    Packs/day: 0.50    Years: 30.00  . Smokeless tobacco: Never Used  . Alcohol use No  . Drug use: No  . Sexual activity: No    Tobacco Counseling Ready to quit: No Counseling given: No   Activities of Daily Living In your present state of health, do  you have any difficulty performing the following activities: 02/03/2016  Hearing? N  Vision? Y  Difficulty concentrating or making decisions? Y  Walking or climbing stairs? N  Dressing or bathing? N  Doing errands, shopping? Y  Preparing Food and eating ? N  Using the Toilet? N  In the past six months, have you accidently leaked urine? N  Do you have problems with loss of bowel control? N  Managing your Medications? N  Managing your Finances? N  Housekeeping or managing your Housekeeping? N  Some recent data might be hidden    Immunizations and Health Maintenance Immunization History  Administered Date(s) Administered  . Influenza,inj,Quad PF,36+ Mos 10/03/2012, 09/21/2014, 11/07/2015  . Influenza-Unspecified 10/28/2013  . Pneumococcal Conjugate-13 01/04/2014, 01/04/2014  . Pneumococcal Polysaccharide-23 12/29/2012   Health Maintenance Due  Topic Date Due  . Hepatitis C Screening  07-09-1945    Patient Care Team: Gayland Curry, DO as PCP - General (Geriatric Medicine) Calton Dach, MD as Referring Physician (Optometry) Latanya Maudlin, MD as Consulting Physician (Orthopedic Surgery) Warden Fillers, MD as Consulting Physician (Ophthalmology)  Indicate any recent Medical Services you may have received from other than [facility] providers in the past year (date may be approximate).     Assessment:   This is a routine wellness examination for Alison [name].   Hearing/Vision screen Vision Screening Comments: Last eye exam done 01/28/16 with Dr. Katy Fitch. Pt has hx of cataracts.   Dietary issues and exercise activities discussed: Current Exercise Habits: The patient does not participate in regular exercise at present  Goals    . Quit smoking / using tobacco    . Weight < 200 lb (90.719 kg)          Try to get back down below 200 lbs for next visit.  Continue cycling and try to eat better (with handouts provided).      Depression Screen PHQ 2/9 Scores 02/03/2016 11/07/2015  07/08/2015 04/04/2015 08/22/2014 06/22/2014 05/04/2013  PHQ - 2 Score 0 0 0 0 0 0 0    Fall Risk Fall Risk  02/03/2016 11/07/2015 07/08/2015 04/04/2015 06/22/2014  Falls in the past year? No No No No No  Risk for fall due to : - - - - -    Cognitive Function: MMSE - Mini Mental State Exam 02/03/2016  Orientation to time 5  Orientation to Place 5  Registration 3  Attention/ Calculation 5  Recall 2  Language- name 2 objects 2  Language- repeat 1  Language- follow 3 step command 3  Language- read & follow direction 1  Write a sentence 1  Copy design 1  Total score 29        Screening Tests Health Maintenance  Topic Date Due  . Hepatitis C Screening  1945/01/15  .  HEMOGLOBIN A1C  05/06/2016  . OPHTHALMOLOGY EXAM  10/24/2016  . FOOT EXAM  11/06/2016  . MAMMOGRAM  04/07/2017  . COLONOSCOPY  09/08/2017  . TETANUS/TDAP  10/29/2023  . INFLUENZA VACCINE  Completed  . DEXA SCAN  Completed  . ZOSTAVAX  Completed  . PNA vac Low Risk Adult  Completed      Plan:    I have personally reviewed and addressed the Medicare Annual Wellness questionnaire and have noted the following in the patient's chart:  A. Medical and social history B. Use of alcohol, tobacco or illicit drugs  C. Current medications and supplements D. Functional ability and status E.  Nutritional status F.  Physical activity G. Advance directives H. List of other physicians I.  Hospitalizations, surgeries, and ER visits in previous 12 months J.  [place] to include hearing, vision, cognitive, depression L. Referrals and appointments - none  In addition, I have reviewed and discussed with patient certain preventive protocols, quality metrics, and best practice recommendations. A written personalized care plan for preventive services as well as general preventive health recommendations were provided to patient.  See attached scanned questionnaire for additional information.   Signed,   Allyn Kenner,  LPN Health Advisor

## 2016-02-05 ENCOUNTER — Other Ambulatory Visit: Payer: Medicare Other

## 2016-02-06 ENCOUNTER — Encounter: Payer: Self-pay | Admitting: *Deleted

## 2016-02-07 ENCOUNTER — Ambulatory Visit (INDEPENDENT_AMBULATORY_CARE_PROVIDER_SITE_OTHER): Payer: Medicare Other | Admitting: Internal Medicine

## 2016-02-07 ENCOUNTER — Ambulatory Visit: Payer: Medicare Other

## 2016-02-07 ENCOUNTER — Encounter: Payer: Self-pay | Admitting: Internal Medicine

## 2016-02-07 VITALS — BP 138/80 | HR 79 | Temp 98.1°F | Wt 211.0 lb

## 2016-02-07 DIAGNOSIS — E039 Hypothyroidism, unspecified: Secondary | ICD-10-CM | POA: Diagnosis not present

## 2016-02-07 DIAGNOSIS — E785 Hyperlipidemia, unspecified: Secondary | ICD-10-CM

## 2016-02-07 DIAGNOSIS — I10 Essential (primary) hypertension: Secondary | ICD-10-CM

## 2016-02-07 DIAGNOSIS — E1169 Type 2 diabetes mellitus with other specified complication: Secondary | ICD-10-CM

## 2016-02-07 DIAGNOSIS — Z720 Tobacco use: Secondary | ICD-10-CM | POA: Diagnosis not present

## 2016-02-07 DIAGNOSIS — E119 Type 2 diabetes mellitus without complications: Secondary | ICD-10-CM | POA: Diagnosis not present

## 2016-02-07 DIAGNOSIS — H401130 Primary open-angle glaucoma, bilateral, stage unspecified: Secondary | ICD-10-CM

## 2016-02-07 NOTE — Progress Notes (Signed)
Location:  [facility] clinic Provider:  Satia Winger L. Mariea [name], D.O., C.M.D.  Code Status: full code Goals of Care:  Advanced Directives 02/07/2016  Does Patient Have a Medical Advance Directive? No  Would patient like information on creating a medical advance directive? -  Pre-existing out of facility DNR order (yellow form or pink MOST form) -   Chief Complaint  Patient presents with  . Medical Management of Chronic Issues    3 mth follow-up    HPI: Patient is a 71 y.o. female seen today for medical management of chronic diseases.     Nothing new.    Pain:  Left knee region--says muscles around the knee when she sits too long and doesn't do the exercises.  Walking helps a lot.  Taking tylenol for the pain 650mg  1 time per day some days, max 2x.    BP at goal today.  Labs reviewed with her.  Sugar average trended up.    Did have eye appt last week 1/16 and making good progress.  Has new eye drop.  Pressure improved to 13 and 14.  Lost one of the eye drops and cannot get until February.  She has a sample of something else, but doesn't like it.    Tobacco abuse:  Smoking unchanged.    Past Medical History:  Diagnosis Date  . Benign essential hypertension   . Diabetes mellitus without complication ([place])   . Diverticulitis of colon (without mention of hemorrhage)(562.11)   . Goiter, specified as simple   . Hyperlipidemia LDL goal < 100   . Numbness    TOES RT FOOT and toes left foot  . Obesity, unspecified   . Osteoarthrosis, unspecified whether generalized or localized, lower leg   . Osteopenia   . Other specified disease of nail    FUNGUS OF FINGERNAILS  . Pernicious anemia   . Unspecified glaucoma(365.9)   . Unspecified vitamin D deficiency     Past Surgical History:  Procedure Laterality Date  . BACK SURGERY    . CATARACT EXTRACTION     right eye 06/20/15 left eye 09/12/15  . COLECTOMY  2009  . JOINT REPLACEMENT     right  . LUMBAR LAMINECTOMY/DECOMPRESSION MICRODISCECTOMY  N/A 03/30/2013   Procedure: CENTRAL DECOMPRESSION L4 - L5 AND EXCISION 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;  Surgeon: Tobi Bastos, MD;  Location: [facility];  Service: Orthopedics;  Laterality: N/A;  . TOTAL KNEE ARTHROPLASTY Right 02/01/2013   Procedure: RIGHT TOTAL KNEE ARTHROPLASTY;  Surgeon: Tobi Bastos, MD;  Location: [facility];  Service: Orthopedics;  Laterality: Right;  . TOTAL KNEE ARTHROPLASTY Left 04/18/2014   Procedure: LEFT TOTAL KNEE ARTHROPLASTY;  Surgeon: Latanya Maudlin, MD;  Location: [facility];  Service: Orthopedics;  Laterality: Left;    Allergies  Allergen Reactions  . Aspirin Nausea Only    Can take 81 mg but can't take 325mg     Allergies as of 02/07/2016      Reactions   Aspirin Nausea Only   Can take 81 mg but can't take 325mg       Medication List       Accurate as of 02/07/16  8:34 AM. Always use your most recent med list.          aspirin EC 81 MG tablet Take 81 mg by mouth daily.   carboxymethylcellulose 0.5 % Soln Commonly known as:  REFRESH PLUS Place 1 drop into both eyes 3 (three) times daily as needed (dry eyes).  COMBIGAN 0.2-0.5 % ophthalmic solution Generic drug:  brimonidine-timolol INT 1 GTT INTO OU BID   ferrous sulfate 325 (65 FE) MG tablet Take 1 tablet (325 mg total) by mouth 2 (two) times daily with a meal.   lansoprazole 30 MG capsule Commonly known as:  PREVACID TAKE 1 CAPSULE BY MOUTH DAILY AT 12 NOON   lisinopril 5 MG tablet Commonly known as:  PRINIVIL,ZESTRIL Take 1 tablet (5 mg total) by mouth daily.   metFORMIN 500 MG tablet Commonly known as:  GLUCOPHAGE TAKE 1/2 TABLET BY MOUTH EVERY EVENING AT SUPPER   multivitamin tablet Take 1 tablet by mouth daily.   omega-3 acid ethyl esters 1 g capsule Commonly known as:  LOVAZA Take 1 g by mouth daily.   Oyster Shell Calcium/D 250-125 MG-UNIT Tabs Take 1 tablet by mouth.   pravastatin 80 MG tablet Commonly known as:  PRAVACHOL TAKE 1 TABLET BY MOUTH EVERY MORNING     prednisoLONE acetate 1 % ophthalmic suspension Commonly known as:  PRED FORTE Place 1 drop into both eyes 2 (two) times daily.   Vitamin D-3 1000 units Caps Take by mouth daily.       Review of Systems:  Review of Systems  Constitutional: Negative for chills, fever and malaise/fatigue.  HENT: Negative for congestion.   Eyes: Negative for blurred vision.  Respiratory: Negative for cough and shortness of breath.   Cardiovascular: Negative for chest pain, palpitations and leg swelling.  Gastrointestinal: Negative for abdominal pain, blood in stool, constipation and melena.  Genitourinary: Negative for dysuria, frequency and urgency.  Musculoskeletal: Positive for joint pain. Negative for falls.  Skin: Negative for itching and rash.  Neurological: Negative for dizziness, loss of consciousness, weakness and headaches.  Endo/Heme/Allergies:       Diabetes  Psychiatric/Behavioral: Negative for depression and memory loss.    Health Maintenance  Topic Date Due  . HEMOGLOBIN A1C  08/02/2016  . OPHTHALMOLOGY EXAM  10/24/2016  . FOOT EXAM  11/06/2016  . MAMMOGRAM  04/07/2017  . COLONOSCOPY  09/08/2017  . TETANUS/TDAP  10/29/2023  . INFLUENZA VACCINE  Completed  . DEXA SCAN  Completed  . ZOSTAVAX  Completed  . Hepatitis C Screening  Completed  . PNA vac Low Risk Adult  Completed    Physical Exam: Vitals:   02/07/16 0819  BP: 138/80  Pulse: 79  Temp: 98.1 F (36.7 C)  TempSrc: Oral  SpO2: 96%  Weight: 211 lb (95.7 kg)   Body mass index is 34.06 kg/m. Physical Exam  Constitutional: She is oriented to person, place, and time. She appears well-developed and well-nourished. No distress.  Cardiovascular: Normal rate, regular rhythm, normal heart sounds and intact distal pulses.   Pulmonary/Chest: Effort normal and breath sounds normal. No respiratory distress.  Abdominal: Soft. Bowel sounds are normal. She exhibits no distension. There is no tenderness.  Musculoskeletal:  Normal range of motion. She exhibits tenderness.  Left anterior knee  Neurological: She is alert and oriented to person, place, and time. No cranial nerve deficit.  Skin: Skin is warm and dry.  Psychiatric: She has a normal mood and affect.    Labs reviewed: Basic Metabolic Panel:  Recent Labs  07/05/15 0831 11/06/15 0816 02/03/16 0840  NA 138 140 140  K 4.4 4.3 4.4  CL 98 108 106  CO2 24 25 27   GLUCOSE 101* 103* 112*  BUN 11 13 9   CREATININE 0.74 0.95* 0.80  CALCIUM 9.9 9.6 9.9  TSH  --  1.08  --  Liver Function Tests:  Recent Labs  03/29/15 0814 02/03/16 0840  AST 17 15  ALT 12 12  ALKPHOS 60 61  BILITOT 0.3 0.5  PROT 6.8 7.2  ALBUMIN 4.3 4.4   No results for input(s): LIPASE, AMYLASE in the last 8760 hours. No results for input(s): AMMONIA in the last 8760 hours. CBC:  Recent Labs  03/29/15 0814 02/03/16 0840  WBC 9.8 8.1  NEUTROABS 6.5 5,508  HGB  --  12.6  HCT 37.9 38.9  MCV 88 89.2  PLT 361 344   Lipid Panel:  Recent Labs  07/05/15 0831 11/06/15 0816 02/03/16 0840  CHOL 156 158 150  HDL 39* 32* 32*  LDLCALC 97 102 83  TRIG 99 120 177*  CHOLHDL 4.0 4.9 4.7   Lab Results  Component Value Date   HGBA1C 5.7 (H) 02/03/2016    Assessment/Plan 1. Type 2 diabetes mellitus without complication, without long-term current use of insulin (HCC) -control has been fairly stable -needs to exercise more and eat better -cont same regimen - Hemoglobin A1c; Future  2. Benign essential hypertension -bp at goal today, cont same regimen - Basic metabolic panel; Future  3. Hyperlipidemia associated with type 2 diabetes mellitus (HCC) - cont pravachol, improved - Lipid panel; Future  4. Tobacco abuse -ongoing smoking and no plans to quit  5. Primary open angle glaucoma of both eyes, unspecified glaucoma stage -was improved by report but now lost her eye drops and says sample she got at [facility] will not be enough to tide her over until she can  fill next; advised to see what cost actually is and call [facility] back about it  6. Acquired hypothyroidism -cont to monitor - TSH; Future  Labs/tests ordered:   Orders Placed This Encounter  Procedures  . Hemoglobin A1c    Standing Status:   Future    Standing Expiration Date:   08/06/2016  . Lipid panel    Standing Status:   Future    Standing Expiration Date:   08/06/2016    Order Specific Question:   Has the patient fasted?    Answer:   Yes  . Basic metabolic panel    Standing Status:   Future    Standing Expiration Date:   08/06/2016    Order Specific Question:   Has the patient fasted?    Answer:   Yes  . TSH    Standing Status:   Future    Standing Expiration Date:   08/06/2016   Next appt:  05/18/2016    Othella Slappey L. Denise Bramblett, D.O. [facility] [address], [place] [address] Cell Phone (Mon-Fri 8am-5pm):  [phone] On Call:  [phone] & follow prompts after 5pm & weekends Office Phone:  [phone] Office Fax:  [phone]

## 2016-03-24 DIAGNOSIS — Z961 Presence of intraocular lens: Secondary | ICD-10-CM | POA: Diagnosis not present

## 2016-03-24 DIAGNOSIS — H31091 Other chorioretinal scars, right eye: Secondary | ICD-10-CM | POA: Diagnosis not present

## 2016-03-24 DIAGNOSIS — H401131 Primary open-angle glaucoma, bilateral, mild stage: Secondary | ICD-10-CM | POA: Diagnosis not present

## 2016-04-13 DIAGNOSIS — Z1231 Encounter for screening mammogram for malignant neoplasm of breast: Secondary | ICD-10-CM | POA: Diagnosis not present

## 2016-04-13 DIAGNOSIS — Z803 Family history of malignant neoplasm of breast: Secondary | ICD-10-CM | POA: Diagnosis not present

## 2016-04-13 LAB — HM MAMMOGRAPHY

## 2016-04-20 ENCOUNTER — Encounter: Payer: Self-pay | Admitting: *Deleted

## 2016-05-14 ENCOUNTER — Other Ambulatory Visit: Payer: Medicare Other

## 2016-05-14 DIAGNOSIS — I10 Essential (primary) hypertension: Secondary | ICD-10-CM

## 2016-05-14 DIAGNOSIS — E039 Hypothyroidism, unspecified: Secondary | ICD-10-CM

## 2016-05-14 DIAGNOSIS — E1169 Type 2 diabetes mellitus with other specified complication: Secondary | ICD-10-CM

## 2016-05-14 DIAGNOSIS — E785 Hyperlipidemia, unspecified: Secondary | ICD-10-CM

## 2016-05-14 DIAGNOSIS — E119 Type 2 diabetes mellitus without complications: Secondary | ICD-10-CM

## 2016-05-14 LAB — BASIC METABOLIC PANEL
BUN: 14 mg/dL (ref 7–25)
CO2: 24 mmol/L (ref 20–31)
Calcium: 9.7 mg/dL (ref 8.6–10.4)
Chloride: 105 mmol/L (ref 98–110)
Creat: 0.86 mg/dL (ref 0.60–0.93)
Glucose, Bld: 113 mg/dL — ABNORMAL HIGH (ref 65–99)
Potassium: 4.4 mmol/L (ref 3.5–5.3)
Sodium: 138 mmol/L (ref 135–146)

## 2016-05-14 LAB — TSH: TSH: 1 mIU/L

## 2016-05-14 LAB — LIPID PANEL
Cholesterol: 155 mg/dL (ref <200)
HDL: 36 mg/dL — ABNORMAL LOW (ref >50)
LDL Cholesterol: 91 mg/dL (ref <100)
Total CHOL/HDL Ratio: 4.3 Ratio (ref <5.0)
Triglycerides: 141 mg/dL (ref <150)
VLDL: 28 mg/dL (ref <30)

## 2016-05-15 LAB — HEMOGLOBIN A1C
Hgb A1c MFr Bld: 5.5 % (ref <5.7)
Mean Plasma Glucose: 111 mg/dL

## 2016-05-18 ENCOUNTER — Encounter: Payer: Self-pay | Admitting: Internal Medicine

## 2016-05-18 ENCOUNTER — Ambulatory Visit (INDEPENDENT_AMBULATORY_CARE_PROVIDER_SITE_OTHER): Payer: Medicare Other | Admitting: Internal Medicine

## 2016-05-18 VITALS — BP 138/80 | HR 77 | Temp 97.7°F | Wt 211.0 lb

## 2016-05-18 DIAGNOSIS — E1169 Type 2 diabetes mellitus with other specified complication: Secondary | ICD-10-CM | POA: Diagnosis not present

## 2016-05-18 DIAGNOSIS — E119 Type 2 diabetes mellitus without complications: Secondary | ICD-10-CM | POA: Diagnosis not present

## 2016-05-18 DIAGNOSIS — E785 Hyperlipidemia, unspecified: Secondary | ICD-10-CM

## 2016-05-18 DIAGNOSIS — K219 Gastro-esophageal reflux disease without esophagitis: Secondary | ICD-10-CM

## 2016-05-18 DIAGNOSIS — E039 Hypothyroidism, unspecified: Secondary | ICD-10-CM

## 2016-05-18 DIAGNOSIS — Z720 Tobacco use: Secondary | ICD-10-CM

## 2016-05-18 DIAGNOSIS — F17210 Nicotine dependence, cigarettes, uncomplicated: Secondary | ICD-10-CM

## 2016-05-18 DIAGNOSIS — M48062 Spinal stenosis, lumbar region with neurogenic claudication: Secondary | ICD-10-CM | POA: Diagnosis not present

## 2016-05-18 MED ORDER — GABAPENTIN 300 MG PO CAPS: 300.0000 mg | ORAL_CAPSULE | Freq: Every day | ORAL | 1 refills | Status: DC

## 2016-05-18 NOTE — Progress Notes (Signed)
Location:  [facility] clinic Provider:  Tawnie Ehresman L. Mariea Clonts, D.O., C.M.D.  Code Status: full code Goals of Care:  Advanced Directives 02/07/2016  Does Patient Have a Medical Advance Directive? No  Would patient like information on creating a medical advance directive? -  Pre-existing out of facility DNR order (yellow form or pink MOST form) -  Previously provided living will/hcpoa, but pt has not yet completed this and provided a copy  Chief Complaint  Patient presents with  . Medical Management of Chronic Issues    35mth follow-up    HPI: Patient is a 71 y.o. female seen today for medical management of chronic diseases.    Tobacco use:  Ongoing.  Not ready to quit.  She was counseled for more than 3 mins on this today--we discussed how her family members are also ill as a result of smoking (brother just had CABG).  Also, she has had difficulty with circulation herself, is diabetic and high risk for these events--MI, stroke, loss of limbs.  Back is hurting her again.  She's ok when sitting.  Hurts when she gets up or she's standing for any length of time.  Going on about 2 weeks.  She bought icy hot pads.  They help, but it didn't take it away.  Took tylenol which also did not take it away.  Nothing going down her legs.  Still has separate chronic knee pain.  Feels like sometimes when she presses her butt, she seems like she can feel something.  Mainly in her lower back.  Thinks maybe moving her mother around and helping take care of her brought it on.  Sat on heating pad also, but still came back.  Still has numbness in her toes.  Gabapentin didn't really help her before when she needed the spinal cord decompression at L4-5.    Hba1c has improved to 5.5.  On metformin and lisinopril and asa.   LDL went up slightly to 91.  On max pravachol.  Reflux does pretty well.  Getting otc now.   BMP wnl.  Hypothyroidism:  TSH 1.00 wnl--not on meds.    Past Medical History:  Diagnosis Date  . Benign  essential hypertension   . Diabetes mellitus without complication ([place])   . Diverticulitis of colon (without mention of hemorrhage)(562.11)   . Goiter, specified as simple   . Hyperlipidemia LDL goal < 100   . Numbness    TOES RT FOOT and toes left foot  . Obesity, unspecified   . Osteoarthrosis, unspecified whether generalized or localized, lower leg   . Osteopenia   . Other specified disease of nail    FUNGUS OF FINGERNAILS  . Pernicious anemia   . Unspecified glaucoma(365.9)   . Unspecified vitamin D deficiency     Past Surgical History:  Procedure Laterality Date  . BACK SURGERY    . CATARACT EXTRACTION     right eye 06/20/15 left eye 09/12/15  . COLECTOMY  2009  . JOINT REPLACEMENT     right  . LUMBAR LAMINECTOMY/DECOMPRESSION MICRODISCECTOMY N/A 03/30/2013   Procedure: CENTRAL DECOMPRESSION L4 - L5 AND EXCISION 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;  Surgeon: Tobi Bastos, MD;  Location: [facility];  Service: Orthopedics;  Laterality: N/A;  . TOTAL KNEE ARTHROPLASTY Right 02/01/2013   Procedure: RIGHT TOTAL KNEE ARTHROPLASTY;  Surgeon: Tobi Bastos, MD;  Location: [facility];  Service: Orthopedics;  Laterality: Right;  . TOTAL KNEE ARTHROPLASTY Left 04/18/2014   Procedure: LEFT TOTAL KNEE  ARTHROPLASTY;  Surgeon: Latanya Maudlin, MD;  Location: [facility];  Service: Orthopedics;  Laterality: Left;    Allergies  Allergen Reactions  . Aspirin Nausea Only    Can take 81 mg but can't take 325mg     Allergies as of 05/18/2016      Reactions   Aspirin Nausea Only   Can take 81 mg but can't take 325mg       Medication List       Accurate as of 05/18/16  8:39 AM. Always use your most recent med list.          aspirin EC 81 MG tablet Take 81 mg by mouth daily.   carboxymethylcellulose 0.5 % Soln Commonly known as:  REFRESH PLUS Place 1 drop into both eyes 3 (three) times daily as needed (dry eyes).   COMBIGAN 0.2-0.5 % ophthalmic solution Generic drug:  brimonidine-timolol INT 1 GTT  INTO OU BID   ferrous sulfate 325 (65 FE) MG tablet Take 1 tablet (325 mg total) by mouth 2 (two) times daily with a meal.   lansoprazole 30 MG capsule Commonly known as:  PREVACID TAKE 1 CAPSULE BY MOUTH DAILY AT 12 NOON   lisinopril 5 MG tablet Commonly known as:  PRINIVIL,ZESTRIL Take 1 tablet (5 mg total) by mouth daily.   metFORMIN 500 MG tablet Commonly known as:  GLUCOPHAGE TAKE 1/2 TABLET BY MOUTH EVERY EVENING AT SUPPER   multivitamin tablet Take 1 tablet by mouth daily.   omega-3 acid ethyl esters 1 g capsule Commonly known as:  LOVAZA Take 1 g by mouth daily.   Oyster Shell Calcium/D 250-125 MG-UNIT Tabs Take 1 tablet by mouth.   pravastatin 80 MG tablet Commonly known as:  PRAVACHOL TAKE 1 TABLET BY MOUTH EVERY MORNING   prednisoLONE acetate 1 % ophthalmic suspension Commonly known as:  PRED FORTE Place 1 drop into both eyes 2 (two) times daily.   Vitamin D-3 1000 units Caps Take by mouth daily.       Review of Systems:  Review of Systems  Constitutional: Negative for chills, fever and malaise/fatigue.  HENT: Negative for hearing loss.   Eyes: Negative for blurred vision.       Glaucoma on drops  Respiratory: Negative for cough, shortness of breath and wheezing.   Cardiovascular: Negative for chest pain, palpitations and leg swelling.  Gastrointestinal: Positive for heartburn. Negative for abdominal pain, blood in stool, constipation and melena.  Genitourinary: Negative for dysuria.  Musculoskeletal: Positive for back pain and myalgias. Negative for falls.  Skin: Negative for itching and rash.  Neurological: Positive for tingling. Negative for weakness.  Endo/Heme/Allergies: Does not bruise/bleed easily.  Psychiatric/Behavioral: Negative for depression and memory loss. The patient is not nervous/anxious and does not have insomnia.     Health Maintenance  Topic Date Due  . INFLUENZA VACCINE  08/12/2016  . OPHTHALMOLOGY EXAM  10/24/2016  . FOOT  EXAM  11/06/2016  . HEMOGLOBIN A1C  11/14/2016  . COLONOSCOPY  09/08/2017  . MAMMOGRAM  04/14/2018  . TETANUS/TDAP  10/29/2023  . DEXA SCAN  Completed  . Hepatitis C Screening  Completed  . PNA vac Low Risk Adult  Completed    Physical Exam: Vitals:   05/18/16 0825  BP: 138/80  Pulse: 77  Temp: 97.7 F (36.5 C)  TempSrc: Oral  SpO2: 97%  Weight: 211 lb (95.7 kg)   Body mass index is 34.06 kg/m. Physical Exam  Constitutional: She is oriented to person, place, and time. She appears  well-developed and well-nourished. No distress.  Cardiovascular: Normal rate, regular rhythm and normal heart sounds.   Pulmonary/Chest: Effort normal and breath sounds normal. She has no wheezes.  Abdominal: Bowel sounds are normal.  Musculoskeletal: Normal range of motion.  Stiff on standing, tender over SI joints and left lower lumbar paravertebral area  Neurological: She is alert and oriented to person, place, and time. No cranial nerve deficit.  Skin: Skin is warm and dry. Capillary refill takes less than 2 seconds.  Thick gray fungal fingernails  Psychiatric: She has a normal mood and affect.    Labs reviewed: Basic Metabolic Panel:  Recent Labs  11/06/15 0816 02/03/16 0840 05/14/16 0841  NA 140 140 138  K 4.3 4.4 4.4  CL 108 106 105  CO2 25 27 24   GLUCOSE 103* 112* 113*  BUN 13 9 14   CREATININE 0.95* 0.80 0.86  CALCIUM 9.6 9.9 9.7  TSH 1.08  --  1.00   Liver Function Tests:  Recent Labs  02/03/16 0840  AST 15  ALT 12  ALKPHOS 61  BILITOT 0.5  PROT 7.2  ALBUMIN 4.4   No results for input(s): LIPASE, AMYLASE in the last 8760 hours. No results for input(s): AMMONIA in the last 8760 hours. CBC:  Recent Labs  02/03/16 0840  WBC 8.1  NEUTROABS 5,508  HGB 12.6  HCT 38.9  MCV 89.2  PLT 344   Lipid Panel:  Recent Labs  11/06/15 0816 02/03/16 0840 05/14/16 0841  CHOL 158 150 155  HDL 32* 32* 36*  LDLCALC 102 83 91  TRIG 120 177* 141  CHOLHDL 4.9 4.7 4.3     Lab Results  Component Value Date   HGBA1C 5.5 05/14/2016    Assessment/Plan 1. Spinal stenosis of lumbar region with neurogenic claudication - seems this is likely the cause of her back pain based on history -previously did not benefit from gabapentin, but agrees to try at 300mg  for a week and let me know either way -if ineffective, will try her on cymbalta next (seems a bit down to me also) - gabapentin (NEURONTIN) 300 MG capsule; Take 1 capsule (300 mg total) by mouth at bedtime.  Dispense: 30 capsule; Refill: 1  2. Type 2 diabetes mellitus without complication, without long-term current use of insulin (HCC) -well controlled to normal hba1c with metformin, on ace and asa also which were continued -encouraged healthier diet, limited in exercise due to caring for her mom and back pain - Hemoglobin A1c; Future - CBC with Differential/Platelet; Future - Basic metabolic panel; Future  3. Hyperlipidemia associated with type 2 diabetes mellitus (HCC) - cont pravachol 80mg --we again discussed changing meds if level not better by next time--explained that others are usually more expensive - Lipid panel; Future  4. Tobacco abuse -counseling provided today for more than 3 mins discussing risks of ongoing smoking, benefits of quitting, and means of doing so, but she is not ready at this time--says she is already gaining weight -also discussed her family history of CAD, cerebrovascular disease and her own risks  5. Acquired hypothyroidism -not requiring meds  6. Gastroesophageal reflux disease, esophagitis presence not specified -doing well with rare symptoms, cont prevacid--getting otc now  Labs/tests ordered:   Orders Placed This Encounter  Procedures  . Hemoglobin A1c    Standing Status:   Future    Standing Expiration Date:   11/18/2016  . Lipid panel    Standing Status:   Future    Standing Expiration Date:  11/18/2016    Order Specific Question:   Has the patient fasted?     Answer:   Yes  . CBC with Differential/Platelet    Standing Status:   Future    Standing Expiration Date:   11/18/2016  . Basic metabolic panel    Standing Status:   Future    Standing Expiration Date:   11/18/2016    Order Specific Question:   Has the patient fasted?    Answer:   Yes  . HM DIABETES EYE EXAM    This external order was created through the Results Console.   Next appt:  08/17/2016  Med mgt, labs before    Clelia Trabucco L. Athens Lebeau, D.O. [facility] [address], [place] [address] Cell Phone (Mon-Fri 8am-5pm):  [phone] On Call:  [phone] & follow prompts after 5pm & weekends Office Phone:  [phone] Office Fax:  [phone]

## 2016-05-25 ENCOUNTER — Telehealth: Payer: Self-pay | Admitting: Internal Medicine

## 2016-05-25 DIAGNOSIS — H401131 Primary open-angle glaucoma, bilateral, mild stage: Secondary | ICD-10-CM | POA: Diagnosis not present

## 2016-05-25 DIAGNOSIS — Z961 Presence of intraocular lens: Secondary | ICD-10-CM | POA: Diagnosis not present

## 2016-05-25 DIAGNOSIS — H31091 Other chorioretinal scars, right eye: Secondary | ICD-10-CM | POA: Diagnosis not present

## 2016-05-25 NOTE — Telephone Encounter (Signed)
Patient notified and agreed.  

## 2016-05-25 NOTE — Telephone Encounter (Signed)
Pt says she was told to call Dr. Mariea Clonts on today regarding Gabapentin 300 mg.  The medication is working at night, however she is still experiencing some back  pain as  she continues to take care of her mother daily. Pharmacy name [facility]..Carolin Coy

## 2016-05-25 NOTE — Telephone Encounter (Signed)
Ok, she can increase her gabapentin to 300mg  po bid (one in the morning and one at bedtime)

## 2016-06-03 ENCOUNTER — Other Ambulatory Visit: Payer: Self-pay | Admitting: *Deleted

## 2016-06-03 DIAGNOSIS — M48062 Spinal stenosis, lumbar region with neurogenic claudication: Secondary | ICD-10-CM

## 2016-06-03 MED ORDER — GABAPENTIN 300 MG PO CAPS: 300.0000 mg | ORAL_CAPSULE | Freq: Two times a day (BID) | ORAL | 1 refills | Status: DC

## 2016-06-03 NOTE — Telephone Encounter (Signed)
Patient requested refill to be faxed to pharmacy.  

## 2016-06-05 ENCOUNTER — Other Ambulatory Visit: Payer: Self-pay | Admitting: Internal Medicine

## 2016-07-28 DIAGNOSIS — Z961 Presence of intraocular lens: Secondary | ICD-10-CM | POA: Diagnosis not present

## 2016-07-28 DIAGNOSIS — H401131 Primary open-angle glaucoma, bilateral, mild stage: Secondary | ICD-10-CM | POA: Diagnosis not present

## 2016-07-28 DIAGNOSIS — H31091 Other chorioretinal scars, right eye: Secondary | ICD-10-CM | POA: Diagnosis not present

## 2016-07-31 ENCOUNTER — Other Ambulatory Visit: Payer: Self-pay | Admitting: Internal Medicine

## 2016-08-13 ENCOUNTER — Other Ambulatory Visit: Payer: Medicare Other

## 2016-08-13 DIAGNOSIS — E1169 Type 2 diabetes mellitus with other specified complication: Secondary | ICD-10-CM

## 2016-08-13 DIAGNOSIS — E785 Hyperlipidemia, unspecified: Secondary | ICD-10-CM

## 2016-08-13 DIAGNOSIS — E119 Type 2 diabetes mellitus without complications: Secondary | ICD-10-CM

## 2016-08-13 LAB — LIPID PANEL
Cholesterol: 155 mg/dL (ref <200)
HDL: 38 mg/dL — ABNORMAL LOW (ref >50)
LDL Cholesterol: 94 mg/dL (ref <100)
Total CHOL/HDL Ratio: 4.1 Ratio (ref <5.0)
Triglycerides: 114 mg/dL (ref <150)
VLDL: 23 mg/dL (ref <30)

## 2016-08-13 LAB — BASIC METABOLIC PANEL
BUN: 13 mg/dL (ref 7–25)
CO2: 23 mmol/L (ref 20–31)
Calcium: 9.9 mg/dL (ref 8.6–10.4)
Chloride: 104 mmol/L (ref 98–110)
Creat: 0.9 mg/dL (ref 0.60–0.93)
Glucose, Bld: 117 mg/dL — ABNORMAL HIGH (ref 65–99)
Potassium: 4.5 mmol/L (ref 3.5–5.3)
Sodium: 141 mmol/L (ref 135–146)

## 2016-08-13 LAB — CBC WITH DIFFERENTIAL/PLATELET
Basophils Absolute: 0 cells/uL (ref 0–200)
Basophils Relative: 0 %
Eosinophils Absolute: 170 cells/uL (ref 15–500)
Eosinophils Relative: 2 %
HCT: 39.8 % (ref 35.0–45.0)
Hemoglobin: 12.8 g/dL (ref 11.7–15.5)
Lymphocytes Relative: 25 %
Lymphs Abs: 2125 cells/uL (ref 850–3900)
MCH: 29 pg (ref 27.0–33.0)
MCHC: 32.2 g/dL (ref 32.0–36.0)
MCV: 90 fL (ref 80.0–100.0)
MPV: 9.3 fL (ref 7.5–12.5)
Monocytes Absolute: 765 cells/uL (ref 200–950)
Monocytes Relative: 9 %
Neutro Abs: 5440 cells/uL (ref 1500–7800)
Neutrophils Relative %: 64 %
Platelets: 327 10*3/uL (ref 140–400)
RBC: 4.42 MIL/uL (ref 3.80–5.10)
RDW: 13.8 % (ref 11.0–15.0)
WBC: 8.5 10*3/uL (ref 3.8–10.8)

## 2016-08-14 LAB — HEMOGLOBIN A1C
Hgb A1c MFr Bld: 5.7 % — ABNORMAL HIGH (ref <5.7)
Mean Plasma Glucose: 117 mg/dL

## 2016-08-17 ENCOUNTER — Ambulatory Visit (INDEPENDENT_AMBULATORY_CARE_PROVIDER_SITE_OTHER): Payer: Medicare Other | Admitting: Internal Medicine

## 2016-08-17 ENCOUNTER — Encounter: Payer: Self-pay | Admitting: Internal Medicine

## 2016-08-17 VITALS — BP 138/80 | HR 78 | Temp 98.2°F | Wt 210.0 lb

## 2016-08-17 DIAGNOSIS — E1149 Type 2 diabetes mellitus with other diabetic neurological complication: Secondary | ICD-10-CM | POA: Diagnosis not present

## 2016-08-17 DIAGNOSIS — M48062 Spinal stenosis, lumbar region with neurogenic claudication: Secondary | ICD-10-CM

## 2016-08-17 DIAGNOSIS — H401130 Primary open-angle glaucoma, bilateral, stage unspecified: Secondary | ICD-10-CM

## 2016-08-17 DIAGNOSIS — L858 Other specified epidermal thickening: Secondary | ICD-10-CM | POA: Diagnosis not present

## 2016-08-17 DIAGNOSIS — E1169 Type 2 diabetes mellitus with other specified complication: Secondary | ICD-10-CM

## 2016-08-17 DIAGNOSIS — I10 Essential (primary) hypertension: Secondary | ICD-10-CM

## 2016-08-17 DIAGNOSIS — Z720 Tobacco use: Secondary | ICD-10-CM

## 2016-08-17 DIAGNOSIS — E785 Hyperlipidemia, unspecified: Secondary | ICD-10-CM | POA: Diagnosis not present

## 2016-08-17 MED ORDER — ATORVASTATIN CALCIUM 40 MG PO TABS: 40.0000 mg | ORAL_TABLET | Freq: Every day | ORAL | 3 refills | Status: DC

## 2016-08-17 NOTE — Progress Notes (Signed)
Location:  [facility] clinic Provider:  Aleiya Rye L. Mariea Clonts, D.O., C.M.D.  Code Status: full code Goals of Care:  Advanced Directives 08/17/2016  Does Patient Have a Medical Advance Directive? No  Would patient like information on creating a medical advance directive? No - Patient declined  Pre-existing out of facility DNR order (yellow form or pink MOST form) -   Chief Complaint  Patient presents with  . Medical Management of Chronic Issues    78mth follow-up    HPI: Patient is a 71 y.o. female seen today for medical management of chronic diseases.    Lumbar spinal stenosis:   Back is doing better.  She had upped her gabapentin.  She reports that if she's on her feet like to go out shopping, she's done for the day and has to sit or lie down.  Discussed doing the therapy she had before to try to help this.  Had been moving her mom.    DMII with neuropathy:  hba1c 5.7.  Reports eating more goodies recently.  Will cut down.    Hyperlipidemia in diabetes:  LDL above goal.  Reports that she's eaten a lot.  Does like fried chicken.  She thinks that recently she's made some bad choices.  Also has had more pasta.  She's on max dose pravachol.  She's been away from cheese.    BP at upper limit of normal.    Still smoking--about the same amount.  Not ready to quit.  Vision doing better.  Has new glasses.  Has inflammation in the back of the eyes.  It's been hard to get it better.  She has the stents in her eyes that could be affecting it--they were to open up the tear duct.  Thinks she feels the stents at times.    Past Medical History:  Diagnosis Date  . Benign essential hypertension   . Diabetes mellitus without complication ([place])   . Diverticulitis of colon (without mention of hemorrhage)(562.11)   . Goiter, specified as simple   . Hyperlipidemia LDL goal < 100   . Numbness    TOES RT FOOT and toes left foot  . Obesity, unspecified   . Osteoarthrosis, unspecified whether generalized or  localized, lower leg   . Osteopenia   . Other specified disease of nail    FUNGUS OF FINGERNAILS  . Pernicious anemia   . Unspecified glaucoma(365.9)   . Unspecified vitamin D deficiency     Past Surgical History:  Procedure Laterality Date  . BACK SURGERY    . CATARACT EXTRACTION     right eye 06/20/15 left eye 09/12/15  . COLECTOMY  2009  . JOINT REPLACEMENT     right  . LUMBAR LAMINECTOMY/DECOMPRESSION MICRODISCECTOMY N/A 03/30/2013   Procedure: CENTRAL DECOMPRESSION L4 - L5 AND EXCISION 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;  Surgeon: Tobi Bastos, MD;  Location: [facility];  Service: Orthopedics;  Laterality: N/A;  . TOTAL KNEE ARTHROPLASTY Right 02/01/2013   Procedure: RIGHT TOTAL KNEE ARTHROPLASTY;  Surgeon: Tobi Bastos, MD;  Location: [facility];  Service: Orthopedics;  Laterality: Right;  . TOTAL KNEE ARTHROPLASTY Left 04/18/2014   Procedure: LEFT TOTAL KNEE ARTHROPLASTY;  Surgeon: Latanya Maudlin, MD;  Location: [facility];  Service: Orthopedics;  Laterality: Left;    Allergies  Allergen Reactions  . Aspirin Nausea Only    Can take 81 mg but can't take 325mg     Allergies as of 08/17/2016      Reactions   Aspirin  Nausea Only   Can take 81 mg but can't take 325mg       Medication List       Accurate as of 08/17/16  8:45 AM. Always use your most recent med list.          aspirin EC 81 MG tablet Take 81 mg by mouth daily.   carboxymethylcellulose 0.5 % Soln Commonly known as:  REFRESH PLUS Place 1 drop into both eyes 3 (three) times daily as needed (dry eyes).   COMBIGAN 0.2-0.5 % ophthalmic solution Generic drug:  brimonidine-timolol INT 1 GTT INTO OU BID   ferrous sulfate 325 (65 FE) MG tablet Take 1 tablet (325 mg total) by mouth 2 (two) times daily with a meal.   gabapentin 300 MG capsule Commonly known as:  NEURONTIN Take 1 capsule (300 mg total) by mouth 2 (two) times daily.   lansoprazole 30 MG capsule Commonly known as:  PREVACID TAKE 1 CAPSULE BY MOUTH DAILY AT  12 NOON   lisinopril 5 MG tablet Commonly known as:  PRINIVIL,ZESTRIL TAKE ONE TABLET BY MOUTH ONCE DAILY   metFORMIN 500 MG tablet Commonly known as:  GLUCOPHAGE TAKE 1/2 TABLET BY MOUTH EVERY EVENING AT SUPPER   multivitamin tablet Take 1 tablet by mouth daily.   omega-3 acid ethyl esters 1 g capsule Commonly known as:  LOVAZA Take 1 g by mouth daily.   Oyster Shell Calcium/D 250-125 MG-UNIT Tabs Take 1 tablet by mouth.   pravastatin 80 MG tablet Commonly known as:  PRAVACHOL TAKE ONE TABLET BY MOUTH IN THE MORNING   Vitamin D-3 1000 units Caps Take by mouth daily.       Review of Systems:  Review of Systems  Constitutional: Negative for chills, fever and malaise/fatigue.  HENT: Negative for congestion and hearing loss.   Eyes: Negative for blurred vision.  Respiratory: Negative for cough and shortness of breath.   Cardiovascular: Negative for chest pain, palpitations and leg swelling.  Gastrointestinal: Negative for abdominal pain, blood in stool, constipation, diarrhea and melena.  Genitourinary: Negative for dysuria.  Musculoskeletal: Positive for back pain. Negative for falls.  Skin:       "thing on my left cheek"  Neurological: Positive for tingling and sensory change. Negative for dizziness and weakness.  Endo/Heme/Allergies: Does not bruise/bleed easily.  Psychiatric/Behavioral: Negative for depression and memory loss. The patient is not nervous/anxious and does not have insomnia.     Health Maintenance  Topic Date Due  . INFLUENZA VACCINE  08/12/2016  . OPHTHALMOLOGY EXAM  10/24/2016  . FOOT EXAM  11/06/2016  . HEMOGLOBIN A1C  02/13/2017  . COLONOSCOPY  09/08/2017  . MAMMOGRAM  04/14/2018  . TETANUS/TDAP  10/29/2023  . DEXA SCAN  Completed  . Hepatitis C Screening  Completed  . PNA vac Low Risk Adult  Completed    Physical Exam: Vitals:   08/17/16 0832  BP: 140/80  Pulse: 78  Temp: 98.2 F (36.8 C)  TempSrc: Oral  SpO2: 97%  Weight: 210  lb (95.3 kg)   Body mass index is 33.89 kg/m. Physical Exam  Constitutional: She is oriented to person, place, and time. She appears well-developed and well-nourished. No distress.  HENT:  Head: Normocephalic and atraumatic.  Eyes:  New glasses  Cardiovascular: Normal rate, regular rhythm, normal heart sounds and intact distal pulses.   Pulmonary/Chest: Effort normal and breath sounds normal. No respiratory distress.  Abdominal: Bowel sounds are normal.  Musculoskeletal: Normal range of motion.  Walks with limp,  uses cane (but lost in grocery cart)  Neurological: She is alert and oriented to person, place, and time.  Skin: Skin is warm and dry. Capillary refill takes less than 2 seconds.  Psychiatric: She has a normal mood and affect.    Labs reviewed: Basic Metabolic Panel:  Recent Labs  11/06/15 0816 02/03/16 0840 05/14/16 0841 08/13/16 0835  NA 140 140 138 141  K 4.3 4.4 4.4 4.5  CL 108 106 105 104  CO2 25 27 24 23   GLUCOSE 103* 112* 113* 117*  BUN 13 9 14 13   CREATININE 0.95* 0.80 0.86 0.90  CALCIUM 9.6 9.9 9.7 9.9  TSH 1.08  --  1.00  --    Liver Function Tests:  Recent Labs  02/03/16 0840  AST 15  ALT 12  ALKPHOS 61  BILITOT 0.5  PROT 7.2  ALBUMIN 4.4   No results for input(s): LIPASE, AMYLASE in the last 8760 hours. No results for input(s): AMMONIA in the last 8760 hours. CBC:  Recent Labs  02/03/16 0840 08/13/16 0835  WBC 8.1 8.5  NEUTROABS 5,508 5,440  HGB 12.6 12.8  HCT 38.9 39.8  MCV 89.2 90.0  PLT 344 327   Lipid Panel:  Recent Labs  02/03/16 0840 05/14/16 0841 08/13/16 0835  CHOL 150 155 155  HDL 32* 36* 38*  LDLCALC 83 91 94  TRIG 177* 141 114  CHOLHDL 4.7 4.3 4.1   Lab Results  Component Value Date   HGBA1C 5.7 (H) 08/13/2016    Assessment/Plan 1. Hyperlipidemia associated with type 2 diabetes mellitus ([place]) - not controlled on 80mg  pravachol so change to atoravastin 40mg     - atorvastatin (LIPITOR) 40 MG tablet;  Take 1 tablet (40 mg total) by mouth daily at 6 PM.  Dispense: 30 tablet; Refill: 3 - Lipid panel; Future  2. Type 2 diabetes mellitus with neurological complications (HCC) - cont asa, new lipitor today, lisinopril, metformin, lovaza - Hemoglobin A1c; Future - Lipid panel; Future - Basic metabolic panel; Future  3. Cutaneous horn - requests referral for removal of lesion on left cheek - Ambulatory referral to Dermatology  4. Benign essential hypertension -bp improved 138/80 from arrival - Basic metabolic panel; Future  5. Primary open angle glaucoma of both eyes, unspecified glaucoma stage -following with ophthalmology, vision improved, but it's been hard for ophtho to get her pressure down  6. Tobacco abuse -ongoing, not ready to quit  7. Spinal stenosis, lumbar region, with neurogenic claudication -cont gabapentin, advised to try to do the therapy exercises from after her back surgery to help with the claudication symptoms  Labs/tests ordered:   Orders Placed This Encounter  Procedures  . Hemoglobin A1c    Standing Status:   Future    Standing Expiration Date:   04/17/2017  . Lipid panel    Standing Status:   Future    Standing Expiration Date:   04/17/2017    Order Specific Question:   Has the patient fasted?    Answer:   Yes  . Basic metabolic panel    Standing Status:   Future    Standing Expiration Date:   04/17/2017    Order Specific Question:   Has the patient fasted?    Answer:   Yes  . Ambulatory referral to Dermatology    Referral Priority:   Routine    Referral Type:   Consultation    Referral Reason:   Specialty Services Required    Requested Specialty:   Dermatology  Number of Visits Requested:   1    Next appt:  12/21/2016, labs before  [place]. Naif Alabi, D.O. [facility] [address], [place] [address] Cell Phone (Mon-Fri 8am-5pm):  [phone] On Call:  [phone] & follow prompts after 5pm &  weekends Office Phone:  [phone] Office Fax:  [phone]

## 2016-09-11 ENCOUNTER — Other Ambulatory Visit: Payer: Self-pay | Admitting: Internal Medicine

## 2016-09-11 DIAGNOSIS — E1142 Type 2 diabetes mellitus with diabetic polyneuropathy: Secondary | ICD-10-CM

## 2016-09-16 DIAGNOSIS — H20021 Recurrent acute iridocyclitis, right eye: Secondary | ICD-10-CM | POA: Diagnosis not present

## 2016-09-16 DIAGNOSIS — H401131 Primary open-angle glaucoma, bilateral, mild stage: Secondary | ICD-10-CM | POA: Diagnosis not present

## 2016-09-16 DIAGNOSIS — H31091 Other chorioretinal scars, right eye: Secondary | ICD-10-CM | POA: Diagnosis not present

## 2016-09-16 DIAGNOSIS — Z961 Presence of intraocular lens: Secondary | ICD-10-CM | POA: Diagnosis not present

## 2016-09-24 IMAGING — CR DG CHEST 2V
2 series · 2 of 2 positions shown · non-contrast
Comparison: 02/02/2013

CLINICAL DATA: Preoperative for left knee surgery. Tobacco use.
Hypertension.

EXAM:
CHEST  2 VIEW

[w chest pa]
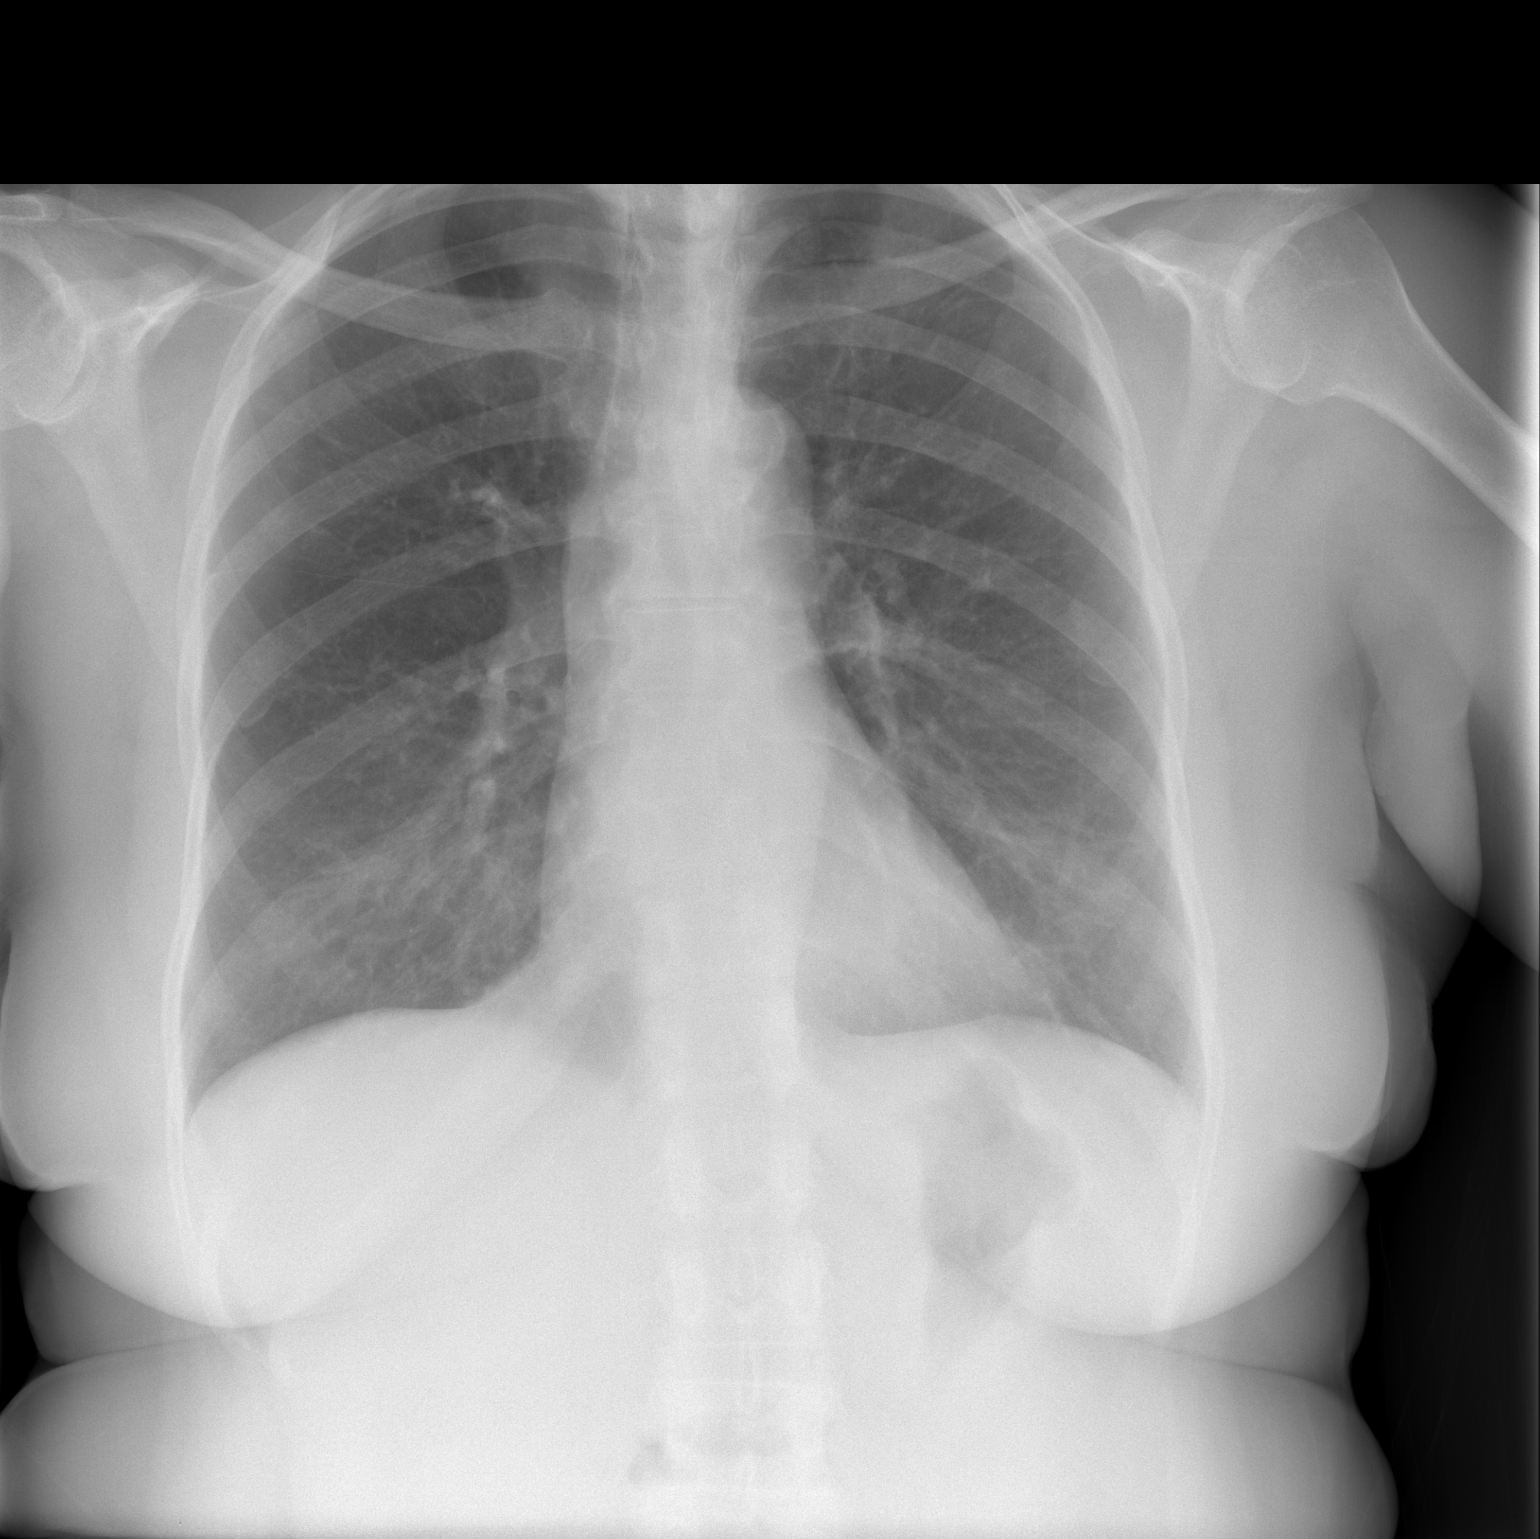

[w chest lat]
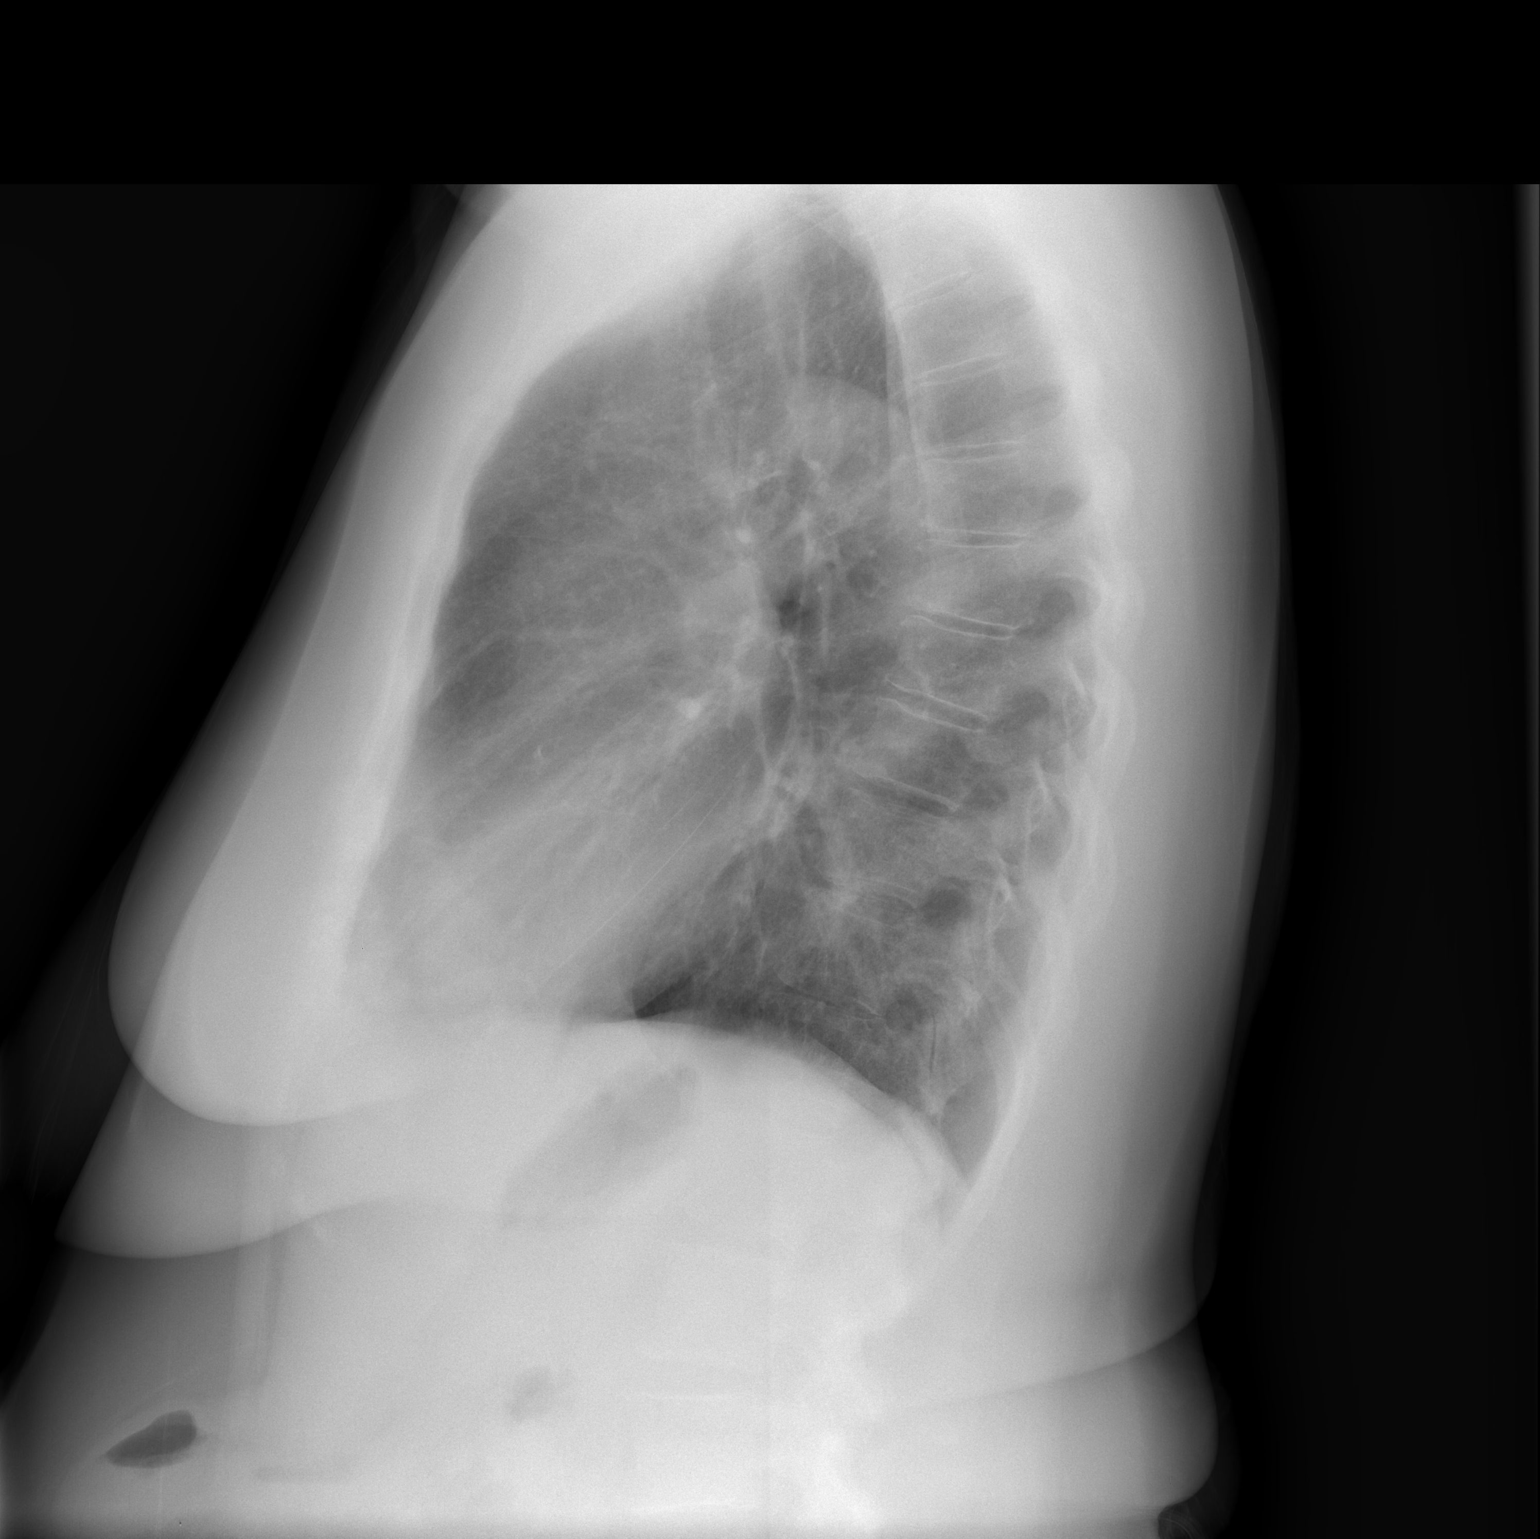

[2 of 2 positions shown; findings below may reference images not displayed]

FINDINGS: Atherosclerotic calcification of the aortic arch. Heart size within
normal limits.

Mild thoracic spondylosis.

The lungs appear clear.  No pleural effusion.
IMPRESSION: 1. Atherosclerotic aortic arch. Otherwise, no significant
abnormalities are observed.

## 2016-10-08 ENCOUNTER — Other Ambulatory Visit: Payer: Self-pay | Admitting: *Deleted

## 2016-10-08 DIAGNOSIS — E785 Hyperlipidemia, unspecified: Principal | ICD-10-CM

## 2016-10-08 DIAGNOSIS — E1169 Type 2 diabetes mellitus with other specified complication: Secondary | ICD-10-CM

## 2016-10-08 MED ORDER — ATORVASTATIN CALCIUM 40 MG PO TABS: 40.0000 mg | ORAL_TABLET | Freq: Every day | ORAL | 3 refills | Status: DC

## 2016-10-08 NOTE — Telephone Encounter (Signed)
[facility] 

## 2016-10-13 DIAGNOSIS — H401131 Primary open-angle glaucoma, bilateral, mild stage: Secondary | ICD-10-CM | POA: Diagnosis not present

## 2016-10-13 DIAGNOSIS — H20021 Recurrent acute iridocyclitis, right eye: Secondary | ICD-10-CM | POA: Diagnosis not present

## 2016-10-13 DIAGNOSIS — H31091 Other chorioretinal scars, right eye: Secondary | ICD-10-CM | POA: Diagnosis not present

## 2016-10-13 DIAGNOSIS — Z961 Presence of intraocular lens: Secondary | ICD-10-CM | POA: Diagnosis not present

## 2016-10-19 DIAGNOSIS — L821 Other seborrheic keratosis: Secondary | ICD-10-CM | POA: Insufficient documentation

## 2016-10-19 DIAGNOSIS — B078 Other viral warts: Secondary | ICD-10-CM | POA: Diagnosis not present

## 2016-10-29 DIAGNOSIS — Z23 Encounter for immunization: Secondary | ICD-10-CM | POA: Diagnosis not present

## 2016-11-04 DIAGNOSIS — Z23 Encounter for immunization: Secondary | ICD-10-CM | POA: Diagnosis not present

## 2016-12-17 ENCOUNTER — Other Ambulatory Visit: Payer: Medicare Other

## 2016-12-17 DIAGNOSIS — I10 Essential (primary) hypertension: Secondary | ICD-10-CM

## 2016-12-17 DIAGNOSIS — E1149 Type 2 diabetes mellitus with other diabetic neurological complication: Secondary | ICD-10-CM

## 2016-12-17 DIAGNOSIS — E785 Hyperlipidemia, unspecified: Secondary | ICD-10-CM

## 2016-12-17 DIAGNOSIS — E1169 Type 2 diabetes mellitus with other specified complication: Secondary | ICD-10-CM

## 2016-12-18 ENCOUNTER — Encounter: Payer: Self-pay | Admitting: *Deleted

## 2016-12-18 LAB — LIPID PANEL
Cholesterol: 120 mg/dL (ref <200)
HDL: 40 mg/dL — ABNORMAL LOW (ref >50)
LDL Cholesterol (Calc): 60 mg/dL (calc)
Non-HDL Cholesterol (Calc): 80 mg/dL (calc) (ref <130)
Total CHOL/HDL Ratio: 3 (calc) (ref <5.0)
Triglycerides: 112 mg/dL (ref <150)

## 2016-12-18 LAB — BASIC METABOLIC PANEL
BUN: 11 mg/dL (ref 7–25)
CO2: 28 mmol/L (ref 20–32)
Calcium: 9.8 mg/dL (ref 8.6–10.4)
Chloride: 106 mmol/L (ref 98–110)
Creat: 0.76 mg/dL (ref 0.60–0.93)
Glucose, Bld: 116 mg/dL — ABNORMAL HIGH (ref 65–99)
Potassium: 4.4 mmol/L (ref 3.5–5.3)
Sodium: 141 mmol/L (ref 135–146)

## 2016-12-18 LAB — HEMOGLOBIN A1C
Hgb A1c MFr Bld: 5.9 % of total Hgb — ABNORMAL HIGH (ref <5.7)
Mean Plasma Glucose: 123 (calc)
eAG (mmol/L): 6.8 (calc)

## 2016-12-21 ENCOUNTER — Ambulatory Visit: Payer: Self-pay | Admitting: Internal Medicine

## 2016-12-23 ENCOUNTER — Ambulatory Visit: Payer: Self-pay | Admitting: Nurse Practitioner

## 2016-12-28 ENCOUNTER — Encounter: Payer: Self-pay | Admitting: Nurse Practitioner

## 2016-12-28 ENCOUNTER — Ambulatory Visit (INDEPENDENT_AMBULATORY_CARE_PROVIDER_SITE_OTHER): Payer: Medicare Other | Admitting: Nurse Practitioner

## 2016-12-28 VITALS — BP 124/78 | HR 77 | Temp 97.7°F | Ht 66.0 in | Wt 215.0 lb

## 2016-12-28 DIAGNOSIS — E1169 Type 2 diabetes mellitus with other specified complication: Secondary | ICD-10-CM

## 2016-12-28 DIAGNOSIS — E1149 Type 2 diabetes mellitus with other diabetic neurological complication: Secondary | ICD-10-CM

## 2016-12-28 DIAGNOSIS — Z720 Tobacco use: Secondary | ICD-10-CM

## 2016-12-28 DIAGNOSIS — D649 Anemia, unspecified: Secondary | ICD-10-CM | POA: Diagnosis not present

## 2016-12-28 DIAGNOSIS — E785 Hyperlipidemia, unspecified: Secondary | ICD-10-CM | POA: Diagnosis not present

## 2016-12-28 DIAGNOSIS — I10 Essential (primary) hypertension: Secondary | ICD-10-CM | POA: Diagnosis not present

## 2016-12-28 NOTE — Progress Notes (Signed)
Careteam: Patient Care Team: Gayland Curry, DO as PCP - General (Geriatric Medicine) Shirley Muscat Loreen Freud, MD as Referring Physician (Optometry) Latanya Maudlin, MD as Consulting Physician (Orthopedic Surgery) Warden Fillers, MD as Consulting Physician (Ophthalmology)  Advanced Directive information    Allergies  Allergen Reactions  . Aspirin Nausea Only    Can take 81 mg but can't take '325mg'$     Chief Complaint  Patient presents with  . Medical Management of Chronic Issues    4 month follow-up, DM foot exam DUE, labs reviewed by PCP (patient received copy in mail)   . Health Maintenance    Flu vaccine, eye exam pending Jan 2019    . Medication Refill    No refills needed      HPI: Patient is a 71 y.o. female seen in the office today for routine follow up.  Pt with hx of hyperlipidemia, htn, DM, tobacco abuse, spinal stenosis with neuropathy, anemia, glaucoma  DM-  A1c at goal at 5.9  Taking metformin 500 mg 1/2 tablet twice daily  Hyperlipidemia- currently on lipitor 40 mg daily which was changed at last OV. LDL now at goal.   HTN- taking  Lisinopril 5 mg daily, blood pressure at goal.   Anemia- on iron twice daily, hgb at goal. Has been on iron for a long time, greater than 2 years, recent hgb stable.   Back and knee pain- ongoing, using tylenol which makes it more tolerable. Has done PT;  gets up and gets on bike every day which helps. Uses muscle rubs which helps as well.   Review of Systems:  Review of Systems  Constitutional: Negative for chills, fever and malaise/fatigue.  HENT: Negative for congestion and hearing loss.   Eyes: Negative for blurred vision.  Respiratory: Negative for cough and shortness of breath.   Cardiovascular: Negative for chest pain, palpitations and leg swelling.  Gastrointestinal: Negative for abdominal pain, blood in stool, constipation, diarrhea and melena.  Genitourinary: Negative for dysuria.  Musculoskeletal: Positive for  back pain and joint pain (knee pain). Negative for falls.  Neurological: Positive for tingling and sensory change (bilateral feet). Negative for dizziness and weakness.  Endo/Heme/Allergies: Does not bruise/bleed easily.  Psychiatric/Behavioral: Negative for depression and memory loss. The patient is not nervous/anxious and does not have insomnia.     Past Medical History:  Diagnosis Date  . Benign essential hypertension   . Diabetes mellitus without complication ([place])   . Diverticulitis of colon (without mention of hemorrhage)(562.11)   . Goiter, specified as simple   . Hyperlipidemia LDL goal < 100   . Numbness    TOES RT FOOT and toes left foot  . Obesity, unspecified   . Osteoarthrosis, unspecified whether generalized or localized, lower leg   . Osteopenia   . Other specified disease of nail    FUNGUS OF FINGERNAILS  . Pernicious anemia   . Unspecified glaucoma(365.9)   . Unspecified vitamin D deficiency    Past Surgical History:  Procedure Laterality Date  . BACK SURGERY    . CATARACT EXTRACTION     right eye 06/20/15 left eye 09/12/15  . COLECTOMY  2009  . JOINT REPLACEMENT     right  . LUMBAR LAMINECTOMY/DECOMPRESSION MICRODISCECTOMY N/A 03/30/2013   Procedure: CENTRAL DECOMPRESSION L4 - L5 AND EXCISION 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;  Surgeon: Tobi Bastos, MD;  Location: [facility];  Service: Orthopedics;  Laterality: N/A;  . TOTAL KNEE ARTHROPLASTY Right 02/01/2013  Procedure: RIGHT TOTAL KNEE ARTHROPLASTY;  Surgeon: Tobi Bastos, MD;  Location: [facility];  Service: Orthopedics;  Laterality: Right;  . TOTAL KNEE ARTHROPLASTY Left 04/18/2014   Procedure: LEFT TOTAL KNEE ARTHROPLASTY;  Surgeon: Latanya Maudlin, MD;  Location: [facility];  Service: Orthopedics;  Laterality: Left;   Social History:   reports that she has been smoking.  She has a 15.00 pack-year smoking history. she has never used smokeless tobacco. She reports that she does not drink alcohol or use drugs.  Family  History  Problem Relation Age of Onset  . Alzheimer's disease Mother   . Stroke Mother   . Hyperlipidemia Mother   . Heart disease Father   . Diabetes Sister     Medications:   Medication List        Accurate as of 12/28/16  2:17 PM. Always use your most recent med list.          aspirin EC 81 MG tablet   atorvastatin 40 MG tablet Commonly known as:  LIPITOR Take 1 tablet (40 mg total) by mouth daily at 6 PM.   carboxymethylcellulose 0.5 % Soln Commonly known as:  REFRESH PLUS   COMBIGAN 0.2-0.5 % ophthalmic solution Generic drug:  brimonidine-timolol   ferrous sulfate 325 (65 FE) MG tablet Take 1 tablet (325 mg total) by mouth 2 (two) times daily with a meal.   lisinopril 5 MG tablet Commonly known as:  PRINIVIL,ZESTRIL TAKE ONE TABLET BY MOUTH ONCE DAILY   metFORMIN 500 MG tablet Commonly known as:  GLUCOPHAGE TAKE 1/2 TABLET BY MOUTH IN THE EVENING AT SUPPER   multivitamin tablet   omega-3 acid ethyl esters 1 g capsule Commonly known as:  LOVAZA   Oyster Shell Calcium/D 250-125 MG-UNIT Tabs   prednisoLONE acetate 1 % ophthalmic suspension Commonly known as:  PRED FORTE   Vitamin D-3 1000 units Caps        Physical Exam:  Vitals:   12/28/16 1407  BP: 124/78  Pulse: 77  Temp: 97.7 F (36.5 C)  TempSrc: Oral  SpO2: 95%  Weight: 215 lb (97.5 kg)  Height: '5\' 6"'$  (1.676 m)   Body mass index is 34.7 kg/m.  Physical Exam  Constitutional: She is oriented to person, place, and time. She appears well-developed and well-nourished. No distress.  HENT:  Head: Normocephalic and atraumatic.  Eyes:  Wears glasses  Cardiovascular: Normal rate, regular rhythm, normal heart sounds and intact distal pulses.  Pulmonary/Chest: Effort normal and breath sounds normal. No respiratory distress.  Abdominal: Soft. Bowel sounds are normal.  Musculoskeletal: Normal range of motion. She exhibits no edema or tenderness.  Uses cane  Neurological: She is alert and  oriented to person, place, and time.  Skin: Skin is warm and dry. Capillary refill takes less than 2 seconds.  Psychiatric: She has a normal mood and affect.    Labs reviewed: Basic Metabolic Panel: Recent Labs    05/14/16 0841 08/13/16 0835 12/17/16 0827  NA 138 141 141  K 4.4 4.5 4.4  CL 105 104 106  CO2 '24 23 28  '$ GLUCOSE 113* 117* 116*  BUN '14 13 11  '$ CREATININE 0.86 0.90 0.76  CALCIUM 9.7 9.9 9.8  TSH 1.00  --   --    Liver Function Tests: Recent Labs    02/03/16 0840  AST 15  ALT 12  ALKPHOS 61  BILITOT 0.5  PROT 7.2  ALBUMIN 4.4   No results for input(s): LIPASE, AMYLASE in the last 8760  hours. No results for input(s): AMMONIA in the last 8760 hours. CBC: Recent Labs    02/03/16 0840 08/13/16 0835  WBC 8.1 8.5  NEUTROABS 5,508 5,440  HGB 12.6 12.8  HCT 38.9 39.8  MCV 89.2 90.0  PLT 344 327   Lipid Panel: Recent Labs    02/03/16 0840 05/14/16 0841 08/13/16 0835 12/17/16 0827  CHOL 150 155 155 120  HDL 32* 36* 38* 40*  LDLCALC 83 91 94  --   TRIG 177* 141 114 112  CHOLHDL 4.7 4.3 4.1 3.0   TSH: Recent Labs    05/14/16 0841  TSH 1.00   A1C: Lab Results  Component Value Date   HGBA1C 5.9 (H) 12/17/2016     Assessment/Plan 1. Type 2 diabetes mellitus with neurological complications (HCC) -H6K at goal, cont metformin and dietary modifications.  - Hemoglobin A1c; Future  2. Hyperlipidemia associated with type 2 diabetes mellitus (HCC) -LDL at goal on atorvastatin  - CMP with eGFR; Future  3. Benign essential hypertension Blood pressure stable, conts on lisinopril 5 mg daily with DASH diet.  4. Tobacco abuse Continues to smoke not ready to quit at this time.   5. Anemia, unspecified type -has been on iron BID for several years unsure when she really started, hgb has been stable for 2 years. Appears it was started in 2015 after right total knee with acute blood loss anemia. Will DC at this time and follow up with next blood work. -  CBC with Differential/Platelets; Future  Next appt: 4 months with lab work before visit Miciah Shealy K. Harle Battiest  [facility] [phone] 8 am - 5 pm) [phone] (after hours)

## 2016-12-28 NOTE — Patient Instructions (Signed)
Stop iron, we will recheck this with next blood draw  Follow up in 4 months with lab work prior to visit.

## 2017-01-18 DIAGNOSIS — H401131 Primary open-angle glaucoma, bilateral, mild stage: Secondary | ICD-10-CM | POA: Diagnosis not present

## 2017-01-18 DIAGNOSIS — H31091 Other chorioretinal scars, right eye: Secondary | ICD-10-CM | POA: Diagnosis not present

## 2017-01-18 DIAGNOSIS — H20021 Recurrent acute iridocyclitis, right eye: Secondary | ICD-10-CM | POA: Diagnosis not present

## 2017-01-18 DIAGNOSIS — Z961 Presence of intraocular lens: Secondary | ICD-10-CM | POA: Diagnosis not present

## 2017-01-28 ENCOUNTER — Other Ambulatory Visit: Payer: Self-pay | Admitting: Internal Medicine

## 2017-03-09 ENCOUNTER — Other Ambulatory Visit: Payer: Self-pay | Admitting: Internal Medicine

## 2017-03-09 DIAGNOSIS — E1142 Type 2 diabetes mellitus with diabetic polyneuropathy: Secondary | ICD-10-CM

## 2017-04-19 DIAGNOSIS — Z803 Family history of malignant neoplasm of breast: Secondary | ICD-10-CM | POA: Diagnosis not present

## 2017-04-19 DIAGNOSIS — Z1231 Encounter for screening mammogram for malignant neoplasm of breast: Secondary | ICD-10-CM | POA: Diagnosis not present

## 2017-04-26 ENCOUNTER — Ambulatory Visit (INDEPENDENT_AMBULATORY_CARE_PROVIDER_SITE_OTHER): Payer: Medicare Other

## 2017-04-26 ENCOUNTER — Other Ambulatory Visit: Payer: Medicare Other

## 2017-04-26 VITALS — BP 140/78 | HR 86 | Temp 98.2°F | Ht 66.0 in | Wt 214.0 lb

## 2017-04-26 DIAGNOSIS — E785 Hyperlipidemia, unspecified: Secondary | ICD-10-CM | POA: Diagnosis not present

## 2017-04-26 DIAGNOSIS — D649 Anemia, unspecified: Secondary | ICD-10-CM

## 2017-04-26 DIAGNOSIS — E1149 Type 2 diabetes mellitus with other diabetic neurological complication: Secondary | ICD-10-CM | POA: Diagnosis not present

## 2017-04-26 DIAGNOSIS — Z0000 Encounter for general adult medical examination without abnormal findings: Secondary | ICD-10-CM

## 2017-04-26 DIAGNOSIS — E1169 Type 2 diabetes mellitus with other specified complication: Secondary | ICD-10-CM

## 2017-04-26 NOTE — Patient Instructions (Signed)
Alison [name] , Thank you for taking time to come for your Medicare Wellness Visit. I appreciate your ongoing commitment to your health goals. Please review the following plan we discussed and let me know if I can assist you in the future.   Screening recommendations/referrals: Colonoscopy up to date, due 12/02/2021 Mammogram up to date, due 04/21/2018 Bone Density up to date Recommended yearly ophthalmology/optometry visit for glaucoma screening and checkup Recommended yearly dental visit for hygiene and checkup  Vaccinations: Influenza vaccine up to date, due 2019 fall season Pneumococcal vaccine up to date, completed Tdap vaccine up to date, due 10/29/2023 Shingles vaccine up to date, completed    Advanced directives: Advance directive discussed with you today. I have provided a copy for you to complete at home and have notarized. Once this is complete please bring a copy in to our office so we can scan it into your chart.  Conditions/risks identified: none  Next appointment: Dr. Mariea Clonts 04/29/2017 @ 8:30am            Tyson Dense, RN 04/29/2018 @ 8:30am   Preventive Care 65 Years and Older, Female Preventive care refers to lifestyle choices and visits with your health care provider that can promote health and wellness. What does preventive care include?  A yearly physical exam. This is also called an annual well check.  Dental exams once or twice a year.  Routine eye exams. Ask your health care provider how often you should have your eyes checked.  Personal lifestyle choices, including:  Daily care of your teeth and gums.  Regular physical activity.  Eating a healthy diet.  Avoiding tobacco and drug use.  Limiting alcohol use.  Practicing safe sex.  Taking low-dose aspirin every day.  Taking vitamin and mineral supplements as recommended by your health care provider. What happens during an annual well check? The services and screenings done by your health care provider  during your annual well check will depend on your age, overall health, lifestyle risk factors, and family history of disease. Counseling  Your health care provider may ask you questions about your:  Alcohol use.  Tobacco use.  Drug use.  Emotional well-being.  Home and relationship well-being.  Sexual activity.  Eating habits.  History of falls.  Memory and ability to understand (cognition).  Work and work Statistician.  Reproductive health. Screening  You may have the following tests or measurements:  Height, weight, and BMI.  Blood pressure.  Lipid and cholesterol levels. These may be checked every 5 years, or more frequently if you are over 44 years old.  Skin check.  Lung cancer screening. You may have this screening every year starting at age 67 if you have a 30-pack-year history of smoking and currently smoke or have quit within the past 15 years.  Fecal occult blood test (FOBT) of the stool. You may have this test every year starting at age 49.  Flexible sigmoidoscopy or colonoscopy. You may have a sigmoidoscopy every 5 years or a colonoscopy every 10 years starting at age 34.  Hepatitis C blood test.  Hepatitis B blood test.  Sexually transmitted disease (STD) testing.  Diabetes screening. This is done by checking your blood sugar (glucose) after you have not eaten for a while (fasting). You may have this done every 1-3 years.  Bone density scan. This is done to screen for osteoporosis. You may have this done starting at age 20.  Mammogram. This may be done every 1-2 years. Talk to your  health care provider about how often you should have regular mammograms. Talk with your health care provider about your test results, treatment options, and if necessary, the need for more tests. Vaccines  Your health care provider may recommend certain vaccines, such as:  Influenza vaccine. This is recommended every year.  Tetanus, diphtheria, and acellular pertussis  (Tdap, Td) vaccine. You may need a Td booster every 10 years.  Zoster vaccine. You may need this after age 36.  Pneumococcal 13-valent conjugate (PCV13) vaccine. One dose is recommended after age 42.  Pneumococcal polysaccharide (PPSV23) vaccine. One dose is recommended after age 48. Talk to your health care provider about which screenings and vaccines you need and how often you need them. This information is not intended to replace advice given to you by your health care provider. Make sure you discuss any questions you have with your health care provider. Document Released: 01/25/2015 Document Revised: 09/18/2015 Document Reviewed: 10/30/2014 Elsevier Interactive Patient Education  2017 [place] Prevention in the Home Falls can cause injuries. They can happen to people of all ages. There are many things you can do to make your home safe and to help prevent falls. What can I do on the outside of my home?  Regularly fix the edges of walkways and driveways and fix any cracks.  Remove anything that might make you trip as you walk through a door, such as a raised step or threshold.  Trim any bushes or trees on the path to your home.  Use bright outdoor lighting.  Clear any walking paths of anything that might make someone trip, such as rocks or tools.  Regularly check to see if handrails are loose or broken. Make sure that both sides of any steps have handrails.  Any raised decks and porches should have guardrails on the edges.  Have any leaves, snow, or ice cleared regularly.  Use sand or salt on walking paths during winter.  Clean up any spills in your garage right away. This includes oil or grease spills. What can I do in the bathroom?  Use night lights.  Install grab bars by the toilet and in the tub and shower. Do not use towel bars as grab bars.  Use non-skid mats or decals in the tub or shower.  If you need to sit down in the shower, use a plastic, non-slip  stool.  Keep the floor dry. Clean up any water that spills on the floor as soon as it happens.  Remove soap buildup in the tub or shower regularly.  Attach bath mats securely with double-sided non-slip rug tape.  Do not have throw rugs and other things on the floor that can make you trip. What can I do in the bedroom?  Use night lights.  Make sure that you have a light by your bed that is easy to reach.  Do not use any sheets or blankets that are too big for your bed. They should not hang down onto the floor.  Have a firm chair that has side arms. You can use this for support while you get dressed.  Do not have throw rugs and other things on the floor that can make you trip. What can I do in the kitchen?  Clean up any spills right away.  Avoid walking on wet floors.  Keep items that you use a lot in easy-to-reach places.  If you need to reach something above you, use a strong step stool that has a  grab bar.  Keep electrical cords out of the way.  Do not use floor polish or wax that makes floors slippery. If you must use wax, use non-skid floor wax.  Do not have throw rugs and other things on the floor that can make you trip. What can I do with my stairs?  Do not leave any items on the stairs.  Make sure that there are handrails on both sides of the stairs and use them. Fix handrails that are broken or loose. Make sure that handrails are as long as the stairways.  Check any carpeting to make sure that it is firmly attached to the stairs. Fix any carpet that is loose or worn.  Avoid having throw rugs at the top or bottom of the stairs. If you do have throw rugs, attach them to the floor with carpet tape.  Make sure that you have a light switch at the top of the stairs and the bottom of the stairs. If you do not have them, ask someone to add them for you. What else can I do to help prevent falls?  Wear shoes that:  Do not have high heels.  Have rubber bottoms.  Are  comfortable and fit you well.  Are closed at the toe. Do not wear sandals.  If you use a stepladder:  Make sure that it is fully opened. Do not climb a closed stepladder.  Make sure that both sides of the stepladder are locked into place.  Ask someone to hold it for you, if possible.  Clearly mark and make sure that you can see:  Any grab bars or handrails.  First and last steps.  Where the edge of each step is.  Use tools that help you move around (mobility aids) if they are needed. These include:  Canes.  Walkers.  Scooters.  Crutches.  Turn on the lights when you go into a dark area. Replace any light bulbs as soon as they burn out.  Set up your furniture so you have a clear path. Avoid moving your furniture around.  If any of your floors are uneven, fix them.  If there are any pets around you, be aware of where they are.  Review your medicines with your doctor. Some medicines can make you feel dizzy. This can increase your chance of falling. Ask your doctor what other things that you can do to help prevent falls. This information is not intended to replace advice given to you by your health care provider. Make sure you discuss any questions you have with your health care provider. Document Released: 10/25/2008 Document Revised: 06/06/2015 Document Reviewed: 02/02/2014 Elsevier Interactive Patient Education  2017 [employer].

## 2017-04-26 NOTE — Progress Notes (Signed)
Subjective:   Alison [name] is a 72 y.o. female who presents for Medicare Annual (Subsequent) preventive examination.  Last AWV-02/03/2016    Objective:     Vitals: BP 140/78 (BP Location: Left Arm, Patient Position: Sitting)   Pulse 86   Temp 98.2 F (36.8 C) (Oral)   Ht 5\' 6"  (1.676 m)   Wt 214 lb (97.1 kg)   SpO2 93%   BMI 34.54 kg/m   Body mass index is 34.54 kg/m.  Advanced Directives 04/26/2017 12/28/2016 08/17/2016 02/07/2016 02/03/2016 11/07/2015 07/08/2015  Does Patient Have a Medical Advance Directive? No No No No No No No  Would patient like information on creating a medical advance directive? Yes (MAU/Ambulatory/Procedural Areas - Information given) No - Patient declined No - Patient declined - No - Patient declined No - patient declined information -  Pre-existing out of facility DNR order (yellow form or pink MOST form) - - - - - - -    Tobacco Social History   Tobacco Use  Smoking Status Current Every Day Smoker  . Packs/day: 0.75  . Years: 30.00  . Pack years: 22.50  Smokeless Tobacco Never Used     Ready to quit: Not Answered Counseling given: Not Answered   Clinical Intake:  Pre-visit preparation completed: No  Pain : 0-10 Pain Score: 5  Pain Type: Acute pain Pain Location: Scapula Pain Orientation: Left Pain Descriptors / Indicators: Aching Pain Onset: In the past 7 days Pain Frequency: Intermittent     Diabetes: No  What is the last grade level you completed in school?: HS  Interpreter Needed?: No  Information entered by :: Tyson Dense, RN  Past Medical History:  Diagnosis Date  . Benign essential hypertension   . Diabetes mellitus without complication ([place])   . Diverticulitis of colon (without mention of hemorrhage)(562.11)   . Goiter, specified as simple   . Hyperlipidemia LDL goal < 100   . Numbness    TOES RT FOOT and toes left foot  . Obesity, unspecified   . Osteoarthrosis, unspecified whether generalized or localized,  lower leg   . Osteopenia   . Other specified disease of nail    FUNGUS OF FINGERNAILS  . Pernicious anemia   . Unspecified glaucoma(365.9)   . Unspecified vitamin D deficiency    Past Surgical History:  Procedure Laterality Date  . BACK SURGERY    . CATARACT EXTRACTION     right eye 06/20/15 left eye 09/12/15  . COLECTOMY  2009  . JOINT REPLACEMENT     right  . LUMBAR LAMINECTOMY/DECOMPRESSION MICRODISCECTOMY N/A 03/30/2013   Procedure: CENTRAL DECOMPRESSION L4 - L5 AND EXCISION 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;  Surgeon: Tobi Bastos, MD;  Location: [facility];  Service: Orthopedics;  Laterality: N/A;  . TOTAL KNEE ARTHROPLASTY Right 02/01/2013   Procedure: RIGHT TOTAL KNEE ARTHROPLASTY;  Surgeon: Tobi Bastos, MD;  Location: [facility];  Service: Orthopedics;  Laterality: Right;  . TOTAL KNEE ARTHROPLASTY Left 04/18/2014   Procedure: LEFT TOTAL KNEE ARTHROPLASTY;  Surgeon: Latanya Maudlin, MD;  Location: [facility];  Service: Orthopedics;  Laterality: Left;   Family History  Problem Relation Age of Onset  . Alzheimer's disease Mother   . Stroke Mother   . Hyperlipidemia Mother   . Heart disease Father   . Diabetes Sister    Social History   Socioeconomic History  . Marital status: Divorced    Spouse name: Not on file  . Number of children: Not on  file  . Years of education: Not on file  . Highest education level: Not on file  Occupational History  . Not on file  Social Needs  . Financial resource strain: Not hard at all  . Food insecurity:    Worry: Never true    Inability: Never true  . Transportation needs:    Medical: No    Non-medical: No  Tobacco Use  . Smoking status: Current Every Day Smoker    Packs/day: 0.75    Years: 30.00    Pack years: 22.50  . Smokeless tobacco: Never Used  Substance and Sexual Activity  . Alcohol use: No  . Drug use: No  . Sexual activity: Never  Lifestyle  . Physical activity:    Days per week: 4 days    Minutes per session: 30 min  .  Stress: Only a little  Relationships  . Social connections:    Talks on phone: More than three times a week    Gets together: More than three times a week    Attends religious service: More than 4 times per year    Active member of club or organization: No    Attends meetings of clubs or organizations: Never    Relationship status: Divorced  Other Topics Concern  . Not on file  Social History Narrative  . Not on file    Outpatient Encounter Medications as of 04/26/2017  Medication Sig  . aspirin EC 81 MG tablet Take 81 mg by mouth daily.  Marland Kitchen atorvastatin (LIPITOR) 40 MG tablet Take 1 tablet (40 mg total) by mouth daily at 6 PM.  . Calcium Carbonate-Vitamin D (OYSTER SHELL CALCIUM/D) 250-125 MG-UNIT TABS Take 1 tablet by mouth.  . carboxymethylcellulose (REFRESH PLUS) 0.5 % SOLN Place 1 drop into both eyes 3 (three) times daily as needed (dry eyes).   . Cholecalciferol (VITAMIN D-3) 1000 UNITS CAPS Take by mouth daily.  . COMBIGAN 0.2-0.5 % ophthalmic solution INT 1 GTT INTO OU BID  . lisinopril (PRINIVIL,ZESTRIL) 5 MG tablet TAKE 1 TABLET BY MOUTH ONCE DAILY  . metFORMIN (GLUCOPHAGE) 500 MG tablet TAKE 1/2 TABLET BY MOUTH IN THE EVENING AT SUPPER  . Multiple Vitamin (MULTIVITAMIN) tablet Take 1 tablet by mouth daily.  Marland Kitchen omega-3 acid ethyl esters (LOVAZA) 1 g capsule Take 1 g by mouth daily.  . prednisoLONE acetate (PRED FORTE) 1 % ophthalmic suspension Place 1 drop into the right eye every other day. Rx'ed by Dr.Groat   No facility-administered encounter medications on file as of 04/26/2017.     Activities of Daily Living In your present state of health, do you have any difficulty performing the following activities: 04/26/2017  Hearing? N  Vision? N  Difficulty concentrating or making decisions? N  Walking or climbing stairs? Y  Dressing or bathing? N  Doing errands, shopping? N  Preparing Food and eating ? N  Using the Toilet? N  In the past six months, have you accidently  leaked urine? N  Do you have problems with loss of bowel control? N  Managing your Medications? N  Managing your Finances? N  Housekeeping or managing your Housekeeping? N  Some recent data might be hidden    Patient Care Team: Gayland Curry, DO as PCP - General (Geriatric Medicine) Shirley Muscat Loreen Freud, MD as Referring Physician (Optometry) Latanya Maudlin, MD as Consulting Physician (Orthopedic Surgery) Warden Fillers, MD as Consulting Physician (Ophthalmology)    Assessment:   This is a routine wellness examination for  Pamala Hurry.  Exercise Activities and Dietary recommendations Current Exercise Habits: Home exercise routine, Type of exercise: walking, Time (Minutes): 30, Frequency (Times/Week): 4, Weekly Exercise (Minutes/Week): 120, Intensity: Mild, Exercise limited by: None identified  Goals    . Quit Smoking     Patient will try to incorporate new activites and hobbies to help decrease smoking    . Quit smoking / using tobacco    . Weight < 200 lb (90.719 kg)     Try to get back down below 200 lbs for next visit.  Continue cycling and try to eat better (with handouts provided).       Fall Risk Fall Risk  04/26/2017 12/28/2016 08/17/2016 05/18/2016 02/07/2016  Falls in the past year? No No No No No  Risk for fall due to : - - - - -    Is the patient's home free  of loose throw rugs in walkways, pet beds, electrical cords, etc?   yes      Grab bars in the bathroom? yes      Handrails on the stairs?   yes      Adequate lighting?   yes   Depression Screen PHQ 2/9 Scores 04/26/2017 08/17/2016 05/18/2016 02/07/2016  PHQ - 2 Score 0 0 0 0     Cognitive Function MMSE - Mini Mental State Exam 04/26/2017 02/03/2016  Orientation to time 5 5  Orientation to Place 5 5  Registration 3 3  Attention/ Calculation 5 5  Recall 3 2  Language- name 2 objects 2 2  Language- repeat 1 1  Language- follow 3 step command 3 3  Language- read & follow direction 1 1  Write a sentence 1 1    Copy design 1 1  Total score 30 29        Immunization History  Administered Date(s) Administered  . Influenza,inj,Quad PF,6+ Mos 10/03/2012, 09/21/2014, 11/07/2015  . Influenza-Unspecified 10/28/2013, 11/04/2016  . Pneumococcal Conjugate-13 01/04/2014, 01/04/2014, 11/04/2016  . Pneumococcal Polysaccharide-23 12/29/2012  . Zoster Recombinat (Shingrix) 11/25/2016, 01/26/2017    Qualifies for Shingles Vaccine? No, up to date  Screening Tests Health Maintenance  Topic Date Due  . OPHTHALMOLOGY EXAM  10/24/2016  . HEMOGLOBIN A1C  06/17/2017  . INFLUENZA VACCINE  08/12/2017  . COLONOSCOPY  09/08/2017  . FOOT EXAM  12/28/2017  . MAMMOGRAM  04/14/2018  . TETANUS/TDAP  10/29/2023  . DEXA SCAN  Completed  . Hepatitis C Screening  Completed  . PNA vac Low Risk Adult  Completed    Cancer Screenings: Lung: Low Dose CT Chest recommended if Age 6-80 years, 30 pack-year currently smoking OR have quit w/in 15years. Patient does qualify. Breast:  Up to date on Mammogram? Yes   Up to date of Bone Density/Dexa? Yes Colorectal: up to date  Additional Screenings: : Hepatitis C Screening: declined Last eye exam December 2018 stated by patient     Plan:    I have personally reviewed and addressed the Medicare Annual Wellness questionnaire and have noted the following in the patient's chart:  A. Medical and social history B. Use of alcohol, tobacco or illicit drugs  C. Current medications and supplements D. Functional ability and status E.  Nutritional status F.  Physical activity G. Advance directives H. List of other physicians I.  Hospitalizations, surgeries, and ER visits in previous 12 months J.  [place] to include hearing, vision, cognitive, depression L. Referrals and appointments - none  In addition, I have reviewed and discussed with  patient certain preventive protocols, quality metrics, and best practice recommendations. A written personalized care plan for  preventive services as well as general preventive health recommendations were provided to patient.  See attached scanned questionnaire for additional information.   Signed,   Tyson Dense, RN Nurse Health Advisor  Patient Concerns: None

## 2017-04-27 DIAGNOSIS — H20021 Recurrent acute iridocyclitis, right eye: Secondary | ICD-10-CM | POA: Diagnosis not present

## 2017-04-27 DIAGNOSIS — Z961 Presence of intraocular lens: Secondary | ICD-10-CM | POA: Diagnosis not present

## 2017-04-27 DIAGNOSIS — H401131 Primary open-angle glaucoma, bilateral, mild stage: Secondary | ICD-10-CM | POA: Diagnosis not present

## 2017-04-27 DIAGNOSIS — H31091 Other chorioretinal scars, right eye: Secondary | ICD-10-CM | POA: Diagnosis not present

## 2017-04-27 LAB — HEMOGLOBIN A1C
Hgb A1c MFr Bld: 6 % of total Hgb — ABNORMAL HIGH (ref <5.7)
Mean Plasma Glucose: 126 (calc)
eAG (mmol/L): 7 (calc)

## 2017-04-27 LAB — COMPLETE METABOLIC PANEL WITH GFR
AG Ratio: 1.6 (calc) (ref 1.0–2.5)
ALBUMIN MSPROF: 4.5 g/dL (ref 3.6–5.1)
ALKALINE PHOSPHATASE (APISO): 64 U/L (ref 33–130)
ALT: 16 U/L (ref 6–29)
AST: 16 U/L (ref 10–35)
BILIRUBIN TOTAL: 0.5 mg/dL (ref 0.2–1.2)
BUN: 11 mg/dL (ref 7–25)
CHLORIDE: 104 mmol/L (ref 98–110)
CO2: 29 mmol/L (ref 20–32)
Calcium: 10.1 mg/dL (ref 8.6–10.4)
Creat: 0.91 mg/dL (ref 0.60–0.93)
GFR, Est African American: 73 mL/min/{1.73_m2} (ref >=60)
GFR, Est Non African American: 63 mL/min/{1.73_m2} (ref >=60)
GLOBULIN: 2.8 g/dL (ref 1.9–3.7)
GLUCOSE: 112 mg/dL — AB (ref 65–99)
Potassium: 4.3 mmol/L (ref 3.5–5.3)
SODIUM: 139 mmol/L (ref 135–146)
Total Protein: 7.3 g/dL (ref 6.1–8.1)

## 2017-04-27 LAB — CBC WITH DIFFERENTIAL/PLATELET
BASOS PCT: 0.6 %
Basophils Absolute: 61 cells/uL (ref 0–200)
EOS PCT: 1.1 %
Eosinophils Absolute: 111 cells/uL (ref 15–500)
HCT: 37.1 % (ref 35.0–45.0)
Hemoglobin: 12.6 g/dL (ref 11.7–15.5)
Lymphs Abs: 2273 cells/uL (ref 850–3900)
MCH: 29.6 pg (ref 27.0–33.0)
MCHC: 34 g/dL (ref 32.0–36.0)
MCV: 87.1 fL (ref 80.0–100.0)
MONOS PCT: 7.6 %
MPV: 10.2 fL (ref 7.5–12.5)
NEUTROS PCT: 68.2 %
Neutro Abs: 6888 cells/uL (ref 1500–7800)
PLATELETS: 320 10*3/uL (ref 140–400)
RBC: 4.26 10*6/uL (ref 3.80–5.10)
RDW: 13 % (ref 11.0–15.0)
Total Lymphocyte: 22.5 %
WBC mixed population: 768 cells/uL (ref 200–950)
WBC: 10.1 10*3/uL (ref 3.8–10.8)

## 2017-04-28 LAB — HM DIABETES EYE EXAM

## 2017-04-29 ENCOUNTER — Encounter: Payer: Self-pay | Admitting: Internal Medicine

## 2017-04-29 ENCOUNTER — Ambulatory Visit (INDEPENDENT_AMBULATORY_CARE_PROVIDER_SITE_OTHER): Payer: Medicare Other | Admitting: Internal Medicine

## 2017-04-29 VITALS — BP 132/84 | HR 76 | Temp 98.4°F | Ht 66.0 in | Wt 215.0 lb

## 2017-04-29 DIAGNOSIS — E785 Hyperlipidemia, unspecified: Secondary | ICD-10-CM

## 2017-04-29 DIAGNOSIS — M48062 Spinal stenosis, lumbar region with neurogenic claudication: Secondary | ICD-10-CM

## 2017-04-29 DIAGNOSIS — E1149 Type 2 diabetes mellitus with other diabetic neurological complication: Secondary | ICD-10-CM

## 2017-04-29 DIAGNOSIS — E1169 Type 2 diabetes mellitus with other specified complication: Secondary | ICD-10-CM | POA: Diagnosis not present

## 2017-04-29 DIAGNOSIS — Z720 Tobacco use: Secondary | ICD-10-CM

## 2017-04-29 DIAGNOSIS — I10 Essential (primary) hypertension: Secondary | ICD-10-CM

## 2017-04-29 DIAGNOSIS — M62838 Other muscle spasm: Secondary | ICD-10-CM

## 2017-04-29 NOTE — Patient Instructions (Addendum)
Cut down on snacks and starchy foods, especially bread.   Try to walk more and ride your bike to help lower your sugar. Also exercise more instead of smoking.    #1 thing you should do is quit smoking!    #2 is improve your diet   #3 exercise  Also, please complete your advance directives.

## 2017-04-29 NOTE — Progress Notes (Signed)
Location:  [facility] clinic Provider:  Ellenora Talton L. Mariea Clonts, D.O., C.M.D.  Code Status: full code Goals of Care:  Advanced Directives 04/29/2017  Does Patient Have a Medical Advance Directive? No  Would patient like information on creating a medical advance directive? Yes (MAU/Ambulatory/Procedural Areas - Information given)  Pre-existing out of facility DNR order (yellow form or pink MOST form) -     Chief Complaint  Patient presents with  . Medical Management of Chronic Issues    Pt is being seen for a 4 month routine visit. Pt has no concerns today.   . ACP    Pt has paperwork but has not completed it.     HPI: Patient is a 72 y.o. female seen today for medical management of chronic diseases.    Says she has a habit of squinting her eyes.    Clarise Cruz, RN, gave ACP packet to complete at her AWV.  When her daughter comes over, they will get this completed.    BP at goal.    Has neck pain that doesn't want to go away.  She did biofreeze for it which has helped.  She bough patches she used at night, but they didn't stay on so she got on the heating pad.  Cold made her cold.    Says she's been eating and she can't stop snacking.  Discussed that she's had an upward trend over the past year.  Also has not been walking until about 2 weeks ago.  Does "mess with potatoes sometimes" but biggest thing is she's a bread eater and loves bread.  Does eat wheat.    Her knees get stiff and sore.  If she doesn't walk or get on the bike, they are stiff and hurt.  Thinks she is smoking more, also.  Discussed walking instead of smoking.    Back bothers her more if she brings in groceries or mopping the floor.  Does resolve if she stops and rests or takes generic tylenol.    Past Medical History:  Diagnosis Date  . Benign essential hypertension   . Diabetes mellitus without complication ([place])   . Diverticulitis of colon (without mention of hemorrhage)(562.11)   . Goiter, specified as simple   .  Hyperlipidemia LDL goal < 100   . Numbness    TOES RT FOOT and toes left foot  . Obesity, unspecified   . Osteoarthrosis, unspecified whether generalized or localized, lower leg   . Osteopenia   . Other specified disease of nail    FUNGUS OF FINGERNAILS  . Pernicious anemia   . Unspecified glaucoma(365.9)   . Unspecified vitamin D deficiency     Past Surgical History:  Procedure Laterality Date  . BACK SURGERY    . CATARACT EXTRACTION     right eye 06/20/15 left eye 09/12/15  . COLECTOMY  2009  . JOINT REPLACEMENT     right  . LUMBAR LAMINECTOMY/DECOMPRESSION MICRODISCECTOMY N/A 03/30/2013   Procedure: CENTRAL DECOMPRESSION L4 - L5 AND EXCISION 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;  Surgeon: Tobi Bastos, MD;  Location: [facility];  Service: Orthopedics;  Laterality: N/A;  . TOTAL KNEE ARTHROPLASTY Right 02/01/2013   Procedure: RIGHT TOTAL KNEE ARTHROPLASTY;  Surgeon: Tobi Bastos, MD;  Location: [facility];  Service: Orthopedics;  Laterality: Right;  . TOTAL KNEE ARTHROPLASTY Left 04/18/2014   Procedure: LEFT TOTAL KNEE ARTHROPLASTY;  Surgeon: Latanya Maudlin, MD;  Location: [facility];  Service: Orthopedics;  Laterality: Left;    Allergies  Allergen Reactions  . Aspirin Nausea Only    Can take 81 mg but can't take 325mg     Outpatient Encounter Medications as of 04/29/2017  Medication Sig  . aspirin EC 81 MG tablet Take 81 mg by mouth daily.  Marland Kitchen atorvastatin (LIPITOR) 40 MG tablet Take 1 tablet (40 mg total) by mouth daily at 6 PM.  . Calcium Carbonate-Vitamin D (OYSTER SHELL CALCIUM/D) 250-125 MG-UNIT TABS Take 1 tablet by mouth.  . carboxymethylcellulose (REFRESH PLUS) 0.5 % SOLN Place 1 drop into both eyes 3 (three) times daily as needed (dry eyes).   . Cholecalciferol (VITAMIN D-3) 1000 UNITS CAPS Take by mouth daily.  . COMBIGAN 0.2-0.5 % ophthalmic solution INT 1 GTT INTO OU BID  . lisinopril (PRINIVIL,ZESTRIL) 5 MG tablet TAKE 1 TABLET BY MOUTH ONCE DAILY  . metFORMIN (GLUCOPHAGE) 500  MG tablet TAKE 1/2 TABLET BY MOUTH IN THE EVENING AT SUPPER  . Multiple Vitamin (MULTIVITAMIN) tablet Take 1 tablet by mouth daily.  Marland Kitchen omega-3 acid ethyl esters (LOVAZA) 1 g capsule Take 1 g by mouth daily.  . [DISCONTINUED] prednisoLONE acetate (PRED FORTE) 1 % ophthalmic suspension Place 1 drop into the right eye every other day. Rx'ed by Dr.Groat   No facility-administered encounter medications on file as of 04/29/2017.     Review of Systems:  Review of Systems  Constitutional: Negative for chills and fever.  HENT: Negative for congestion.   Eyes: Negative for blurred vision.  Respiratory: Negative for cough and shortness of breath.   Cardiovascular: Negative for chest pain, palpitations and leg swelling.  Gastrointestinal: Negative for abdominal pain, constipation and melena.  Genitourinary: Negative for dysuria.  Musculoskeletal: Positive for back pain, myalgias and neck pain. Negative for falls.  Neurological: Negative for dizziness and loss of consciousness.  Endo/Heme/Allergies: Does not bruise/bleed easily.  Psychiatric/Behavioral: Negative for depression and memory loss. The patient is not nervous/anxious.     Health Maintenance  Topic Date Due  . OPHTHALMOLOGY EXAM  10/24/2016  . INFLUENZA VACCINE  08/12/2017  . COLONOSCOPY  09/08/2017  . HEMOGLOBIN A1C  10/26/2017  . FOOT EXAM  12/28/2017  . MAMMOGRAM  04/14/2018  . TETANUS/TDAP  10/29/2023  . DEXA SCAN  Completed  . Hepatitis C Screening  Completed  . PNA vac Low Risk Adult  Completed    Physical Exam: Vitals:   04/29/17 0825  BP: 132/84  Pulse: 76  Temp: 98.4 F (36.9 C)  TempSrc: Oral  SpO2: 99%  Weight: 215 lb (97.5 kg)  Height: 5\' 6"  (1.676 m)   Body mass index is 34.7 kg/m. Physical Exam  Constitutional: She is oriented to person, place, and time. She appears well-developed and well-nourished. No distress.  Cardiovascular: Normal rate, regular rhythm, normal heart sounds and intact distal pulses.    Pulmonary/Chest: Effort normal and breath sounds normal. No respiratory distress.  Abdominal: Bowel sounds are normal.  Musculoskeletal: Normal range of motion. She exhibits tenderness.  Over left trapezius with muscle spasm  Neurological: She is alert and oriented to person, place, and time.  Skin: Skin is warm and dry. Capillary refill takes less than 2 seconds.  Psychiatric: She has a normal mood and affect.    Labs reviewed: Basic Metabolic Panel: Recent Labs    05/14/16 0841 08/13/16 0835 12/17/16 0827 04/26/17 0845  NA 138 141 141 139  K 4.4 4.5 4.4 4.3  CL 105 104 106 104  CO2 24 23 28 29   GLUCOSE 113* 117* 116* 112*  BUN  14 13 11 11   CREATININE 0.86 0.90 0.76 0.91  CALCIUM 9.7 9.9 9.8 10.1  TSH 1.00  --   --   --    Liver Function Tests: Recent Labs    04/26/17 0845  AST 16  ALT 16  BILITOT 0.5  PROT 7.3   No results for input(s): LIPASE, AMYLASE in the last 8760 hours. No results for input(s): AMMONIA in the last 8760 hours. CBC: Recent Labs    08/13/16 0835 04/26/17 0845  WBC 8.5 10.1  NEUTROABS 5,440 6,888  HGB 12.8 12.6  HCT 39.8 37.1  MCV 90.0 87.1  PLT 327 320   Lipid Panel: Recent Labs    05/14/16 0841 08/13/16 0835 12/17/16 0827  CHOL 155 155 120  HDL 36* 38* 40*  LDLCALC 91 94 60  TRIG 141 114 112  CHOLHDL 4.3 4.1 3.0   Lab Results  Component Value Date   HGBA1C 6.0 (H) 04/26/2017    Assessment/Plan 1. Type 2 diabetes mellitus with neurological complications (H[facility]) - cont metformin, increase exercise and work on dietary changes; cont ace -counseled to quit smoking - Hemoglobin A1c; Future - Lipid panel; Future  2. Hyperlipidemia associated with type 2 diabetes mellitus ([place]) - cont statin therapy, last LDL at goal, work on diet and exercise and smoking cessation - Lipid panel; Future  3. Benign essential hypertension -bp at goal with current regimen so cont same, recommended smoking cessation - CBC with  Differential/Platelet; Future - COMPLETE METABOLIC PANEL WITH GFR; Future  4. Tobacco abuse -counseled for more than 3 mins on importance of smoking cessation due to her other comorbidities, as well, explained risks of MI, stroke, PAD with amputations, and of course COPD and lung cancer among other cancers -she is aware, and plans to cut down on cigarettes  5. Trapezius muscle spasm -recommended therapeutic massage -cont biofreeze   6. Spinal stenosis, lumbar region, with neurogenic claudication -only bothersome with increased activity now and in back itself, better with tylenol and rest   Labs/tests ordered:  Orders Placed This Encounter  Procedures  . CBC with Differential/Platelet    Standing Status:   Future    Standing Expiration Date:   12/29/2017  . COMPLETE METABOLIC PANEL WITH GFR    Standing Status:   Future    Standing Expiration Date:   12/29/2017  . Hemoglobin A1c    Standing Status:   Future    Standing Expiration Date:   12/29/2017  . Lipid panel    Standing Status:   Future    Standing Expiration Date:   12/29/2017    Next appt:  08/19/2017 med mgt, fasting labs before  Conor Lata L. Lorel Lembo, D.O. [facility] [address], [place] [address] Cell Phone (Mon-Fri 8am-5pm):  [phone] On Call:  [phone] & follow prompts after 5pm & weekends Office Phone:  [phone] Office Fax:  [phone]

## 2017-05-06 DIAGNOSIS — M25562 Pain in left knee: Secondary | ICD-10-CM | POA: Diagnosis not present

## 2017-05-06 DIAGNOSIS — M25561 Pain in right knee: Secondary | ICD-10-CM | POA: Diagnosis not present

## 2017-06-03 ENCOUNTER — Encounter: Payer: Self-pay | Admitting: Internal Medicine

## 2017-06-26 DIAGNOSIS — M25562 Pain in left knee: Secondary | ICD-10-CM | POA: Diagnosis not present

## 2017-06-26 DIAGNOSIS — M25561 Pain in right knee: Secondary | ICD-10-CM | POA: Diagnosis not present

## 2017-07-05 ENCOUNTER — Encounter: Payer: Self-pay | Admitting: Internal Medicine

## 2017-07-05 ENCOUNTER — Ambulatory Visit (INDEPENDENT_AMBULATORY_CARE_PROVIDER_SITE_OTHER): Payer: Medicare Other | Admitting: Internal Medicine

## 2017-07-05 VITALS — BP 140/90 | HR 81 | Temp 98.0°F | Ht 66.0 in | Wt 211.0 lb

## 2017-07-05 DIAGNOSIS — Z720 Tobacco use: Secondary | ICD-10-CM | POA: Diagnosis not present

## 2017-07-05 DIAGNOSIS — Z716 Tobacco abuse counseling: Secondary | ICD-10-CM

## 2017-07-05 DIAGNOSIS — B370 Candidal stomatitis: Secondary | ICD-10-CM | POA: Diagnosis not present

## 2017-07-05 DIAGNOSIS — F17210 Nicotine dependence, cigarettes, uncomplicated: Secondary | ICD-10-CM | POA: Diagnosis not present

## 2017-07-05 MED ORDER — NYSTATIN 100000 UNIT/ML MT SUSP: 5.0000 mL | Freq: Four times a day (QID) | OROMUCOSAL | 1 refills | Status: DC

## 2017-07-05 MED ORDER — NICOTINE 7 MG/24HR TD PT24: 7.0000 mg | MEDICATED_PATCH | Freq: Every day | TRANSDERMAL | 0 refills | Status: DC

## 2017-07-05 MED ORDER — NICOTINE 21 MG/24HR TD PT24: 21.0000 mg | MEDICATED_PATCH | TRANSDERMAL | 0 refills | Status: DC

## 2017-07-05 MED ORDER — NICOTINE 14 MG/24HR TD PT24
14.0000 mg | MEDICATED_PATCH | Freq: Every day | TRANSDERMAL | 0 refills | Status: DC
Start: 2017-07-05 — End: 2017-08-19

## 2017-07-05 NOTE — Patient Instructions (Addendum)
If you smoke your first cigarette more than 30 minutes after waking up in the morning, you should use 2 mg-nicotine lozenges. For weeks 1 to 6 of treatment, you should use one lozenge every 1 to 2 hours. Using at least nine lozenges per day will increase your chance of quitting.    With the patches start with 21 mg patch daily for 28 days, then fill the 14mg  prescription for another 28 days, and then 7mg  daily for 28 days, then stop.   Smoking Tobacco Information Smoking tobacco will very likely harm your health. Tobacco contains a poisonous (toxic), colorless chemical called nicotine. Nicotine affects the brain and makes tobacco addictive. This change in your brain can make it hard to stop smoking. Tobacco also has other toxic chemicals that can hurt your body and raise your risk of many cancers. How can smoking tobacco affect me? Smoking tobacco can increase your chances of having serious health conditions, such as:  Cancer. Smoking is most commonly associated with lung cancer, but can lead to cancer in other parts of the body.  Chronic obstructive pulmonary disease (COPD). This is a long-term lung condition that makes it hard to breathe. It also gets worse over time.  High blood pressure (hypertension), heart disease, stroke, or heart attack.  Lung infections, such as pneumonia.  Cataracts. This is when the lenses in the eyes become clouded.  Digestive problems. This may include peptic ulcers, heartburn, and gastroesophageal reflux disease (GERD).  Oral health problems, such as gum disease and tooth loss.  Loss of taste and smell.  Smoking can affect your appearance by causing:  Wrinkles.  Yellow or stained teeth, fingers, and fingernails.  Smoking tobacco can also affect your social life.  Many workplaces, [employer], hotels, and public places are tobacco-free. This means that you may experience challenges in finding places to smoke when away from home.  The cost of a smoking  habit can be expensive. Expenses for someone who smokes come in two ways: ? You spend money on a regular basis to buy tobacco. ? Your health care costs in the long-term are higher if you smoke.  Tobacco smoke can also affect the health of those around you. Children of smokers have greater chances of: ? Sudden infant death syndrome (SIDS). ? Ear infections. ? Lung infections.  What lifestyle changes can be made?  Do not start smoking. Quit if you already do.  To quit smoking: ? Make a plan to quit smoking and commit yourself to it. Look for programs to help you and ask your health care provider for recommendations and ideas. ? Talk with your health care provider about using nicotine replacement medicines to help you quit. Medicine replacement medicines include gum, lozenges, patches, sprays, or pills. ? Do not replace cigarette smoking with electronic cigarettes, which are commonly called e-cigarettes. The safety of e-cigarettes is not known, and some may contain harmful chemicals. ? Avoid places, people, or situations that tempt you to smoke. ? If you try to quit but return to smoking, don't give up hope. It is very common for people to try a number of times before they fully succeed. When you feel ready again, give it another try.  Quitting smoking might affect the way you eat as well as your weight. Be prepared to monitor your eating habits. Get support in planning and following a healthy diet.  Ask your health care provider about having regular tests (screenings) to check for cancer. This may include blood tests, imaging  tests, and other tests.  Exercise regularly. Consider taking walks, joining a gym, or doing yoga or exercise classes.  Develop skills to manage your stress. These skills include meditation. What are the benefits of quitting smoking? By quitting smoking, you may:  Lower your risk of getting cancer and other diseases caused by smoking.  Live longer.  Breathe  better.  Lower your blood pressure and heart rate.  Stop your addiction to tobacco.  Stop creating secondhand smoke that hurts other people.  Improve your sense of taste and smell.  Look better over time, due to having fewer wrinkles and less staining.  What can happen if changes are not made? If you do not stop smoking, you may:  Get cancer and other diseases.  Develop COPD or other long-term (chronic) lung conditions.  Develop serious problems with your heart and blood vessels (cardiovascular system).  Need more tests to screen for problems caused by smoking.  Have higher, long-term healthcare costs from medicines or treatments related to smoking.  Continue to have worsening changes in your lungs, mouth, and nose.  Where to find support: To get support to quit smoking, consider:  Asking your health care provider for more information and resources.  Taking classes to learn more about quitting smoking.  Looking for local organizations that offer resources about quitting smoking.  Joining a support group for people who want to quit smoking in your local community.  Where to find more information: You may find more information about quitting smoking from:  HelpGuide.org: www.helpguide.org/articles/addictions/how-to-quit-smoking.htm  https://hall.com/: smokefree.gov  American Lung Association: www.lung.org  Contact a health care provider if:  You have problems breathing.  Your lips, nose, or fingers turn blue.  You have chest pain.  You are coughing up blood.  You feel faint or you pass out.  You have other noticeable changes that cause you to worry. Summary  Smoking tobacco can negatively affect your health, the health of those around you, your finances, and your social life.  Do not start smoking. Quit if you already do. If you need help quitting, ask your health care provider.  Think about joining a support group for people who want to quit smoking in  your local community. There are many effective programs that will help you to quit this behavior. This information is not intended to replace advice given to you by your health care provider. Make sure you discuss any questions you have with your health care provider. Document Released: 01/14/2016 Document Revised: 01/14/2016 Document Reviewed: 01/14/2016 Elsevier Interactive Patient Education  [employer].

## 2017-07-05 NOTE — Progress Notes (Signed)
Location:  [facility] clinic Provider: Traeson Dusza L. Mariea Clonts, D.O., C.M.D.  Code Status: full code Goals of Care:  Advanced Directives 04/29/2017  Does Patient Have a Medical Advance Directive? No  Would patient like information on creating a medical advance directive? Yes (MAU/Ambulatory/Procedural Areas - Information given)  Pre-existing out of facility DNR order (yellow form or pink MOST form) -     Chief Complaint  Patient presents with  . Acute Visit    growth on tongue x2 months    HPI: Patient is a 72 y.o. female seen today for an acute visit for a lesion on her tongue for 2 mos.  She thinks it's coming from the prednisone she was started on by her orthopedic surgeon for her knees.    She's worried this could be smoking related.  She smokes less than 1ppd.  She likes smoking and has smoked since she was young.  Discussed cessation options:  Chantix, wellbutrin, patches.    Past Medical History:  Diagnosis Date  . Benign essential hypertension   . Diabetes mellitus without complication ([place])   . Diverticulitis of colon (without mention of hemorrhage)(562.11)   . Goiter, specified as simple   . Hyperlipidemia LDL goal < 100   . Numbness    TOES RT FOOT and toes left foot  . Obesity, unspecified   . Osteoarthrosis, unspecified whether generalized or localized, lower leg   . Osteopenia   . Other specified disease of nail    FUNGUS OF FINGERNAILS  . Pernicious anemia   . Unspecified glaucoma(365.9)   . Unspecified vitamin D deficiency     Past Surgical History:  Procedure Laterality Date  . BACK SURGERY    . CATARACT EXTRACTION     right eye 06/20/15 left eye 09/12/15  . COLECTOMY  2009  . JOINT REPLACEMENT     right  . LUMBAR LAMINECTOMY/DECOMPRESSION MICRODISCECTOMY N/A 03/30/2013   Procedure: CENTRAL DECOMPRESSION L4 - L5 AND EXCISION 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;  Surgeon: Tobi Bastos, MD;  Location: [facility];  Service: Orthopedics;  Laterality: N/A;  . TOTAL KNEE  ARTHROPLASTY Right 02/01/2013   Procedure: RIGHT TOTAL KNEE ARTHROPLASTY;  Surgeon: Tobi Bastos, MD;  Location: [facility];  Service: Orthopedics;  Laterality: Right;  . TOTAL KNEE ARTHROPLASTY Left 04/18/2014   Procedure: LEFT TOTAL KNEE ARTHROPLASTY;  Surgeon: Latanya Maudlin, MD;  Location: [facility];  Service: Orthopedics;  Laterality: Left;    Allergies  Allergen Reactions  . Aspirin Nausea Only    Can take 81 mg but can't take 325mg     Outpatient Encounter Medications as of 07/05/2017  Medication Sig  . aspirin EC 81 MG tablet Take 81 mg by mouth daily.  Marland Kitchen atorvastatin (LIPITOR) 40 MG tablet Take 1 tablet (40 mg total) by mouth daily at 6 PM.  . Calcium Carbonate-Vitamin D (OYSTER SHELL CALCIUM/D) 250-125 MG-UNIT TABS Take 1 tablet by mouth.  . carboxymethylcellulose (REFRESH PLUS) 0.5 % SOLN Place 1 drop into both eyes 3 (three) times daily as needed (dry eyes).   . Cholecalciferol (VITAMIN D-3) 1000 UNITS CAPS Take by mouth daily.  . COMBIGAN 0.2-0.5 % ophthalmic solution INT 1 GTT INTO OU BID  . indomethacin (INDOCIN) 25 MG capsule indomethacin 25 mg capsule  Take 1 capsule twice a day by oral route.  Marland Kitchen lisinopril (PRINIVIL,ZESTRIL) 5 MG tablet TAKE 1 TABLET BY MOUTH ONCE DAILY  . metFORMIN (GLUCOPHAGE) 500 MG tablet TAKE 1/2 TABLET BY MOUTH IN THE EVENING AT  SUPPER  . Multiple Vitamin (MULTIVITAMIN) tablet Take 1 tablet by mouth daily.  Marland Kitchen omega-3 acid ethyl esters (LOVAZA) 1 g capsule Take 1 g by mouth daily.  . predniSONE (DELTASONE) 10 MG tablet TAKE 1 TABLET BY MOUTH THREE TIMES DAILY FOR 2 DAYS THEN 1 TABLET BY MOUTH TWICE DAILY FOR 5 DAYS THEN 1 TABLET BY MOUTH ONCE DAILY UNTIL FI   No facility-administered encounter medications on file as of 07/05/2017.     Review of Systems:  Review of Systems  Constitutional: Negative for chills, fever, malaise/fatigue and weight loss.  HENT: Negative for congestion, sinus pain and sore throat.        No pain on tongue, just white patch  centrally and all over tongue but area on central tongue not scrapable  Respiratory: Negative for stridor.        Smoking persists  Skin: Negative for itching and rash.    Health Maintenance  Topic Date Due  . INFLUENZA VACCINE  08/12/2017  . COLONOSCOPY  09/08/2017  . HEMOGLOBIN A1C  10/26/2017  . FOOT EXAM  12/28/2017  . MAMMOGRAM  04/14/2018  . OPHTHALMOLOGY EXAM  04/29/2018  . TETANUS/TDAP  10/29/2023  . DEXA SCAN  Completed  . Hepatitis C Screening  Completed  . PNA vac Low Risk Adult  Completed    Physical Exam: Vitals:   07/05/17 1146  BP: 140/90  Pulse: 81  Temp: 98 F (36.7 C)  TempSrc: Oral  SpO2: 99%  Weight: 211 lb (95.7 kg)  Height: 5\' 6"  (1.676 m)   Body mass index is 34.06 kg/m. Physical Exam  Constitutional: She is oriented to person, place, and time.  HENT:  Head: Normocephalic and atraumatic.  Tongue with white layer all over, but prominent non-scrapeable area centrally, nontender  Cardiovascular: Normal rate, regular rhythm, normal heart sounds and intact distal pulses.  Pulmonary/Chest: Effort normal and breath sounds normal. She has no wheezes.  Neurological: She is alert and oriented to person, place, and time.    Labs reviewed: Basic Metabolic Panel: Recent Labs    08/13/16 0835 12/17/16 0827 04/26/17 0845  NA 141 141 139  K 4.5 4.4 4.3  CL 104 106 104  CO2 23 28 29   GLUCOSE 117* 116* 112*  BUN 13 11 11   CREATININE 0.90 0.76 0.91  CALCIUM 9.9 9.8 10.1   Liver Function Tests: Recent Labs    04/26/17 0845  AST 16  ALT 16  BILITOT 0.5  PROT 7.3   No results for input(s): LIPASE, AMYLASE in the last 8760 hours. No results for input(s): AMMONIA in the last 8760 hours. CBC: Recent Labs    08/13/16 0835 04/26/17 0845  WBC 8.5 10.1  NEUTROABS 5,440 6,888  HGB 12.8 12.6  HCT 39.8 37.1  MCV 90.0 87.1  PLT 327 320   Lipid Panel: Recent Labs    08/13/16 0835 12/17/16 0827  CHOL 155 120  HDL 38* 40*  LDLCALC 94 60    TRIG 114 112  CHOLHDL 4.1 3.0   Lab Results  Component Value Date   HGBA1C 6.0 (H) 04/26/2017    Assessment/Plan 1. Oral thrush -appears to be, but this has frightened her and she's more eager to quit smoking now -tx with nystatin swish and spit, but if not resolving after prednisone for knees completed and she uses this up, would refer for ENT eval to ensure not a leukoplakia/premaligant lesion - nystatin (MYCOSTATIN) 100000 UNIT/ML suspension; Take 5 mLs (500,000 Units total) by mouth  4 (four) times daily.  Dispense: 60 mL; Refill: 1  2. Tobacco abuse - interested in quitting, gradual patch taper provided (first Rx to pharmacy, then others each month for pt to fill) - nicotine (NICODERM CQ) 21 mg/24hr patch; Place 1 patch (21 mg total) onto the skin daily. For the first 28 days  Dispense: 28 patch; Refill: 0 - nicotine (NICODERM CQ) 14 mg/24hr patch; Place 1 patch (14 mg total) onto the skin daily.  Dispense: 28 patch; Refill: 0 - nicotine (NICODERM CQ) 7 mg/24hr patch; Place 1 patch (7 mg total) onto the skin daily.  Dispense: 28 patch; Refill: 0  3. Encounter for smoking cessation counseling -counseled for 8 minutes plus on this - nicotine (NICODERM CQ) 21 mg/24hr patch; Place 1 patch (21 mg total) onto the skin daily. For the first 28 days  Dispense: 28 patch; Refill: 0 - nicotine (NICODERM CQ) 14 mg/24hr patch; Place 1 patch (14 mg total) onto the skin daily.  Dispense: 28 patch; Refill: 0 - nicotine (NICODERM CQ) 7 mg/24hr patch; Place 1 patch (7 mg total) onto the skin daily.  Dispense: 28 patch; Refill: 0   Labs/tests ordered:  No orders of the defined types were placed in this encounter.  Next appt:  08/16/2017  Asha Grumbine L. Zari Cly, D.O. [facility] [address], [place] [address] Cell Phone (Mon-Fri 8am-5pm):  [phone] On Call:  [phone] & follow prompts after 5pm & weekends Office Phone:  [phone] Office  Fax:  [phone]

## 2017-07-06 ENCOUNTER — Other Ambulatory Visit: Payer: Self-pay | Admitting: Internal Medicine

## 2017-07-06 ENCOUNTER — Telehealth: Payer: Self-pay | Admitting: Internal Medicine

## 2017-07-06 NOTE — Telephone Encounter (Signed)
The patient received an automated call ([employer]) to schedule wellness visit.  She was transferred to me, but her AWV has already been completed. VDM (DD)

## 2017-07-11 ENCOUNTER — Other Ambulatory Visit: Payer: Self-pay | Admitting: Internal Medicine

## 2017-07-11 DIAGNOSIS — B370 Candidal stomatitis: Secondary | ICD-10-CM

## 2017-07-16 ENCOUNTER — Other Ambulatory Visit: Payer: Self-pay | Admitting: Internal Medicine

## 2017-07-16 DIAGNOSIS — B370 Candidal stomatitis: Secondary | ICD-10-CM

## 2017-07-19 ENCOUNTER — Other Ambulatory Visit: Payer: Self-pay | Admitting: Internal Medicine

## 2017-07-19 DIAGNOSIS — B370 Candidal stomatitis: Secondary | ICD-10-CM

## 2017-07-22 ENCOUNTER — Telehealth: Payer: Self-pay | Admitting: *Deleted

## 2017-07-22 DIAGNOSIS — K148 Other diseases of tongue: Secondary | ICD-10-CM

## 2017-07-22 DIAGNOSIS — B370 Candidal stomatitis: Secondary | ICD-10-CM

## 2017-07-22 MED ORDER — NYSTATIN 100000 UNIT/ML MT SUSP: 5.0000 mL | Freq: Four times a day (QID) | OROMUCOSAL | 0 refills | Status: DC

## 2017-07-22 NOTE — Telephone Encounter (Signed)
Patient notified and agreed. Patient stated that she has decreased her smoking to 1-2 day. Had to stop the patches because they made her dizzy.

## 2017-07-22 NOTE — Telephone Encounter (Signed)
Patient called and stated that she needs a refill on the Nystatin Suspension. Stated that it was refilled on 07/19/17 but she takes it 85ml four times daily so it only last her 3 days. Has a spot, oral thrush,  on her tongue that won't go away. Patient has an appointment scheduled for 08/19/17. That is the soonest appointment available. Please Advise.

## 2017-07-22 NOTE — Telephone Encounter (Signed)
Let's send her on to the ENT doctor about the place. I placed the referral.  I gave her a larger bottle of the nystatin until she gets in with them.  How's she doing with the smoking cessation process?  Is she on her patches?

## 2017-07-28 DIAGNOSIS — H31091 Other chorioretinal scars, right eye: Secondary | ICD-10-CM | POA: Diagnosis not present

## 2017-07-28 DIAGNOSIS — Z961 Presence of intraocular lens: Secondary | ICD-10-CM | POA: Diagnosis not present

## 2017-07-28 DIAGNOSIS — H401131 Primary open-angle glaucoma, bilateral, mild stage: Secondary | ICD-10-CM | POA: Diagnosis not present

## 2017-07-28 DIAGNOSIS — H20021 Recurrent acute iridocyclitis, right eye: Secondary | ICD-10-CM | POA: Diagnosis not present

## 2017-08-10 DIAGNOSIS — K1321 Leukoplakia of oral mucosa, including tongue: Secondary | ICD-10-CM | POA: Insufficient documentation

## 2017-08-10 DIAGNOSIS — K148 Other diseases of tongue: Secondary | ICD-10-CM | POA: Diagnosis not present

## 2017-08-10 DIAGNOSIS — F17200 Nicotine dependence, unspecified, uncomplicated: Secondary | ICD-10-CM | POA: Diagnosis not present

## 2017-08-16 ENCOUNTER — Other Ambulatory Visit: Payer: Medicare Other

## 2017-08-16 DIAGNOSIS — E1169 Type 2 diabetes mellitus with other specified complication: Secondary | ICD-10-CM | POA: Diagnosis not present

## 2017-08-16 DIAGNOSIS — E785 Hyperlipidemia, unspecified: Secondary | ICD-10-CM | POA: Diagnosis not present

## 2017-08-16 DIAGNOSIS — I10 Essential (primary) hypertension: Secondary | ICD-10-CM

## 2017-08-16 DIAGNOSIS — E1149 Type 2 diabetes mellitus with other diabetic neurological complication: Secondary | ICD-10-CM

## 2017-08-17 LAB — CBC WITH DIFFERENTIAL/PLATELET
Basophils Absolute: 40 cells/uL (ref 0–200)
Basophils Relative: 0.4 %
Eosinophils Absolute: 81 cells/uL (ref 15–500)
Eosinophils Relative: 0.8 %
HCT: 37.4 % (ref 35.0–45.0)
Hemoglobin: 12.5 g/dL (ref 11.7–15.5)
Lymphs Abs: 2020 cells/uL (ref 850–3900)
MCH: 29.8 pg (ref 27.0–33.0)
MCHC: 33.4 g/dL (ref 32.0–36.0)
MCV: 89 fL (ref 80.0–100.0)
MPV: 10.3 fL (ref 7.5–12.5)
Monocytes Relative: 8.4 %
Neutro Abs: 7110 cells/uL (ref 1500–7800)
Neutrophils Relative %: 70.4 %
Platelets: 326 10*3/uL (ref 140–400)
RBC: 4.2 10*6/uL (ref 3.80–5.10)
RDW: 12.5 % (ref 11.0–15.0)
Total Lymphocyte: 20 %
WBC mixed population: 848 cells/uL (ref 200–950)
WBC: 10.1 10*3/uL (ref 3.8–10.8)

## 2017-08-17 LAB — LIPID PANEL
Cholesterol: 125 mg/dL (ref <200)
HDL: 37 mg/dL — ABNORMAL LOW (ref >50)
LDL Cholesterol (Calc): 67 mg/dL (calc)
Non-HDL Cholesterol (Calc): 88 mg/dL (calc) (ref <130)
Total CHOL/HDL Ratio: 3.4 (calc) (ref <5.0)
Triglycerides: 119 mg/dL (ref <150)

## 2017-08-17 LAB — COMPLETE METABOLIC PANEL WITH GFR
AG Ratio: 1.6 (calc) (ref 1.0–2.5)
ALT: 18 U/L (ref 6–29)
AST: 17 U/L (ref 10–35)
Albumin: 4.4 g/dL (ref 3.6–5.1)
Alkaline phosphatase (APISO): 61 U/L (ref 33–130)
BUN: 12 mg/dL (ref 7–25)
CO2: 28 mmol/L (ref 20–32)
Calcium: 9.8 mg/dL (ref 8.6–10.4)
Chloride: 105 mmol/L (ref 98–110)
Creat: 0.88 mg/dL (ref 0.60–0.93)
GFR, Est African American: 76 mL/min/{1.73_m2} (ref >=60)
GFR, Est Non African American: 66 mL/min/{1.73_m2} (ref >=60)
Globulin: 2.8 g/dL (calc) (ref 1.9–3.7)
Glucose, Bld: 115 mg/dL — ABNORMAL HIGH (ref 65–99)
Potassium: 4.4 mmol/L (ref 3.5–5.3)
Sodium: 140 mmol/L (ref 135–146)
Total Bilirubin: 0.6 mg/dL (ref 0.2–1.2)
Total Protein: 7.2 g/dL (ref 6.1–8.1)

## 2017-08-17 LAB — HEMOGLOBIN A1C
Hgb A1c MFr Bld: 6.2 % of total Hgb — ABNORMAL HIGH (ref <5.7)
Mean Plasma Glucose: 131 (calc)
eAG (mmol/L): 7.3 (calc)

## 2017-08-19 ENCOUNTER — Ambulatory Visit (INDEPENDENT_AMBULATORY_CARE_PROVIDER_SITE_OTHER): Payer: Medicare Other | Admitting: Internal Medicine

## 2017-08-19 ENCOUNTER — Encounter: Payer: Self-pay | Admitting: Internal Medicine

## 2017-08-19 VITALS — BP 130/80 | HR 80 | Temp 98.0°F | Ht 66.0 in | Wt 214.0 lb

## 2017-08-19 DIAGNOSIS — M25561 Pain in right knee: Secondary | ICD-10-CM | POA: Diagnosis not present

## 2017-08-19 DIAGNOSIS — F17209 Nicotine dependence, unspecified, with unspecified nicotine-induced disorders: Secondary | ICD-10-CM | POA: Diagnosis not present

## 2017-08-19 DIAGNOSIS — G8929 Other chronic pain: Secondary | ICD-10-CM

## 2017-08-19 DIAGNOSIS — I10 Essential (primary) hypertension: Secondary | ICD-10-CM

## 2017-08-19 DIAGNOSIS — E785 Hyperlipidemia, unspecified: Secondary | ICD-10-CM

## 2017-08-19 DIAGNOSIS — K1321 Leukoplakia of oral mucosa, including tongue: Secondary | ICD-10-CM

## 2017-08-19 DIAGNOSIS — M25562 Pain in left knee: Secondary | ICD-10-CM | POA: Diagnosis not present

## 2017-08-19 DIAGNOSIS — M48062 Spinal stenosis, lumbar region with neurogenic claudication: Secondary | ICD-10-CM | POA: Diagnosis not present

## 2017-08-19 DIAGNOSIS — Z716 Tobacco abuse counseling: Secondary | ICD-10-CM | POA: Diagnosis not present

## 2017-08-19 DIAGNOSIS — E1169 Type 2 diabetes mellitus with other specified complication: Secondary | ICD-10-CM

## 2017-08-19 DIAGNOSIS — E1149 Type 2 diabetes mellitus with other diabetic neurological complication: Secondary | ICD-10-CM

## 2017-08-19 NOTE — Progress Notes (Signed)
Location:  [facility] clinic Provider:  Girolamo Lortie L. Mariea Clonts, D.O., C.M.D.  Code Status: full code Goals of Care:  Advanced Directives 04/29/2017  Does Patient Have a Medical Advance Directive? No  Would patient like information on creating a medical advance directive? Yes (MAU/Ambulatory/Procedural Areas - Information given)  Pre-existing out of facility DNR order (yellow form or pink MOST form) -   Chief Complaint  Patient presents with  . Medical Management of Chronic Issues    58mth follow-up    HPI: Patient is a 72 y.o. female seen today for medical management of chronic diseases.    She is not on the patch for smoking cessation.  She is only smoking 3 cigarettes vs 10+.   I asked if she had a date set to try to reduce to 2 cigarettes per day.    Reviewed labs:  LDL below goal, but trending up on current lipitor 40mg .   Is on fish oil also. Hba1c has trended up but remains at goal with metformin therapy.  She has a new eye drop:  Latanoprost for glaucoma.  Pressure in both eyes is high, but one worse than the other.  At times, has more difficulty seeing--may be blurry.  Can still read.    Knee is still not doing good.  No blood clot.  Left knee may need more therapy.  Using generic tylenol 2 in the morning and 2 in the evening--650mg  tablets.   She also uses otc rubs which help some.    Past Medical History:  Diagnosis Date  . Benign essential hypertension   . Diabetes mellitus without complication ([place])   . Diverticulitis of colon (without mention of hemorrhage)(562.11)   . Goiter, specified as simple   . Hyperlipidemia LDL goal < 100   . Numbness    TOES RT FOOT and toes left foot  . Obesity, unspecified   . Osteoarthrosis, unspecified whether generalized or localized, lower leg   . Osteopenia   . Other specified disease of nail    FUNGUS OF FINGERNAILS  . Pernicious anemia   . Unspecified glaucoma(365.9)   . Unspecified vitamin D deficiency     Past Surgical History:    Procedure Laterality Date  . BACK SURGERY    . CATARACT EXTRACTION     right eye 06/20/15 left eye 09/12/15  . COLECTOMY  2009  . JOINT REPLACEMENT     right  . LUMBAR LAMINECTOMY/DECOMPRESSION MICRODISCECTOMY N/A 03/30/2013   Procedure: CENTRAL DECOMPRESSION L4 - L5 AND EXCISION 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;  Surgeon: Tobi Bastos, MD;  Location: [facility];  Service: Orthopedics;  Laterality: N/A;  . TOTAL KNEE ARTHROPLASTY Right 02/01/2013   Procedure: RIGHT TOTAL KNEE ARTHROPLASTY;  Surgeon: Tobi Bastos, MD;  Location: [facility];  Service: Orthopedics;  Laterality: Right;  . TOTAL KNEE ARTHROPLASTY Left 04/18/2014   Procedure: LEFT TOTAL KNEE ARTHROPLASTY;  Surgeon: Latanya Maudlin, MD;  Location: [facility];  Service: Orthopedics;  Laterality: Left;    Allergies  Allergen Reactions  . Aspirin Nausea Only    Can take 81 mg but can't take 325mg     Outpatient Encounter Medications as of 08/19/2017  Medication Sig  . aspirin EC 81 MG tablet Take 81 mg by mouth daily.  Marland Kitchen atorvastatin (LIPITOR) 40 MG tablet Take 1 tablet (40 mg total) by mouth daily at 6 PM.  . Calcium Carbonate-Vitamin D (OYSTER SHELL CALCIUM/D) 250-125 MG-UNIT TABS Take 1 tablet by mouth.  . carboxymethylcellulose (REFRESH PLUS)  0.5 % SOLN Place 1 drop into both eyes 3 (three) times daily as needed (dry eyes).   . Cholecalciferol (VITAMIN D-3) 1000 UNITS CAPS Take by mouth daily.  . COMBIGAN 0.2-0.5 % ophthalmic solution INT 1 GTT INTO OU BID  . latanoprost (XALATAN) 0.005 % ophthalmic solution Place 1 drop into both eyes at bedtime.  Marland Kitchen lisinopril (PRINIVIL,ZESTRIL) 5 MG tablet TAKE 1 TABLET BY MOUTH ONCE DAILY  . metFORMIN (GLUCOPHAGE) 500 MG tablet TAKE 1/2 TABLET BY MOUTH IN THE EVENING AT SUPPER  . Multiple Vitamin (MULTIVITAMIN) tablet Take 1 tablet by mouth daily.  Marland Kitchen omega-3 acid ethyl esters (LOVAZA) 1 g capsule Take 1 g by mouth daily.  . [DISCONTINUED] indomethacin (INDOCIN) 25 MG capsule indomethacin 25 mg  capsule  Take 1 capsule twice a day by oral route.  . [DISCONTINUED] nicotine (NICODERM CQ) 14 mg/24hr patch Place 1 patch (14 mg total) onto the skin daily.  . [DISCONTINUED] nicotine (NICODERM CQ) 21 mg/24hr patch Place 1 patch (21 mg total) onto the skin daily. For the first 28 days  . [DISCONTINUED] nicotine (NICODERM CQ) 7 mg/24hr patch Place 1 patch (7 mg total) onto the skin daily.  . [DISCONTINUED] nystatin (MYCOSTATIN) 100000 UNIT/ML suspension Take 5 mLs (500,000 Units total) by mouth 4 (four) times daily.  . [DISCONTINUED] predniSONE (DELTASONE) 10 MG tablet TAKE 1 TABLET BY MOUTH THREE TIMES DAILY FOR 2 DAYS THEN 1 TABLET BY MOUTH TWICE DAILY FOR 5 DAYS THEN 1 TABLET BY MOUTH ONCE DAILY UNTIL FI   No facility-administered encounter medications on file as of 08/19/2017.     Review of Systems:  Review of Systems  Constitutional: Negative for chills, fever and malaise/fatigue.  HENT: Negative for congestion.   Eyes: Positive for blurred vision.  Respiratory: Negative for cough and shortness of breath.   Cardiovascular: Negative for chest pain, palpitations and leg swelling.  Gastrointestinal: Negative for abdominal pain, constipation and diarrhea.  Genitourinary: Negative for dysuria.  Musculoskeletal: Positive for back pain and joint pain. Negative for falls.       Back pain if she sweeps the floor  Skin: Negative for itching and rash.  Neurological: Negative for dizziness and loss of consciousness.  Endo/Heme/Allergies: Does not bruise/bleed easily.  Psychiatric/Behavioral: Negative for depression and memory loss. The patient is not nervous/anxious and does not have insomnia.     Health Maintenance  Topic Date Due  . INFLUENZA VACCINE  08/12/2017  . COLONOSCOPY  09/08/2017  . FOOT EXAM  12/28/2017  . HEMOGLOBIN A1C  02/16/2018  . MAMMOGRAM  04/14/2018  . OPHTHALMOLOGY EXAM  04/29/2018  . TETANUS/TDAP  10/29/2023  . DEXA SCAN  Completed  . Hepatitis C Screening   Completed  . PNA vac Low Risk Adult  Completed    Physical Exam: Vitals:   08/19/17 0800  BP: 130/80  Pulse: 80  Temp: 98 F (36.7 C)  TempSrc: Oral  SpO2: 94%  Weight: 214 lb (97.1 kg)  Height: 5\' 6"  (1.676 m)   Body mass index is 34.54 kg/m. Physical Exam  Constitutional: She is oriented to person, place, and time. She appears well-developed. No distress.  HENT:  Head: Normocephalic and atraumatic.  Small leukoplakia central tongue  Eyes:  glasses  Cardiovascular: Normal rate, regular rhythm, normal heart sounds and intact distal pulses.  Pulmonary/Chest: Effort normal and breath sounds normal. No respiratory distress.  Abdominal: Bowel sounds are normal.  Musculoskeletal: Normal range of motion. She exhibits tenderness.  Bilateral knees  Neurological: She  is alert and oriented to person, place, and time.  Skin: Skin is warm and dry. Capillary refill takes less than 2 seconds.  Psychiatric: She has a normal mood and affect.    Labs reviewed: Basic Metabolic Panel: Recent Labs    12/17/16 0827 04/26/17 0845 08/16/17 0812  NA 141 139 140  K 4.4 4.3 4.4  CL 106 104 105  CO2 28 29 28   GLUCOSE 116* 112* 115*  BUN 11 11 12   CREATININE 0.76 0.91 0.88  CALCIUM 9.8 10.1 9.8   Liver Function Tests: Recent Labs    04/26/17 0845 08/16/17 0812  AST 16 17  ALT 16 18  BILITOT 0.5 0.6  PROT 7.3 7.2   No results for input(s): LIPASE, AMYLASE in the last 8760 hours. No results for input(s): AMMONIA in the last 8760 hours. CBC: Recent Labs    04/26/17 0845 08/16/17 0812  WBC 10.1 10.1  NEUTROABS 6,888 7,110  HGB 12.6 12.5  HCT 37.1 37.4  MCV 87.1 89.0  PLT 320 326   Lipid Panel: Recent Labs    12/17/16 0827 08/16/17 0812  CHOL 120 125  HDL 40* 37*  LDLCALC 60 67  TRIG 112 119  CHOLHDL 3.0 3.4   Lab Results  Component Value Date   HGBA1C 6.2 (H) 08/16/2017    Assessment/Plan 1. Tobacco use disorder, continuous -ongoing, is down to 3  cigarettes from 10  2. Tobacco abuse counseling -spent more than 3 mins discussing decreased risks after cessation, goal setting, benefits of quitting, reasons she still smokes, etc.  3. Type 2 diabetes mellitus with neurological complications ([place]) - cont asa, statin, ace, metformin - avoid sweets, fried foods and starchy carbs, watch portion sizes - Basic metabolic panel; Future - Hemoglobin A1c; Future  4. Hyperlipidemia associated with type 2 diabetes mellitus ([place]) -cont lipitor therapy -LDL at goal, but trending up so counseled about this  - Lipid panel; Future  5. Benign essential hypertension -bp at goal, cont same regimen - Basic metabolic panel; Future  6. Chronic pain of both knees -ongoing, did not have improvement with prednisone therapy, NOT a candidate for NSAIDs with her ongoing asa, ace, and diabetes, probable CAD -cont tylenol, topicals, and encouraged another therapy session if Dr. Gladstone Lighter agrees  7. Leukoplakia of oral mucosa, including tongue -cont to cut down on cigarettes--set goal to be down to 2 if not quit by next visit in december  8. Spinal stenosis of lumbar region with neurogenic claudication -gets flare-ups of back pain when doing housework, using tylenol  Labs/tests ordered:   Orders Placed This Encounter  Procedures  . Basic metabolic panel    Standing Status:   Future    Standing Expiration Date:   04/20/2018    Order Specific Question:   Has the patient fasted?    Answer:   Yes  . Hemoglobin A1c    Standing Status:   Future    Standing Expiration Date:   04/20/2018  . Lipid panel    Standing Status:   Future    Standing Expiration Date:   04/20/2018    Order Specific Question:   Has the patient fasted?    Answer:   Yes    Next appt:  12/23/2017 med mgt, fasting labs before   Maximos Zayas L. Oriel Ojo, D.O. [facility] [address], [place] [address] Cell Phone (Mon-Fri 8am-5pm):   [phone] On Call:  [phone] & follow prompts after 5pm &  weekends Office Phone:  [phone] Office Fax:  [phone]

## 2017-08-30 ENCOUNTER — Other Ambulatory Visit: Payer: Self-pay | Admitting: Internal Medicine

## 2017-08-30 DIAGNOSIS — E1142 Type 2 diabetes mellitus with diabetic polyneuropathy: Secondary | ICD-10-CM

## 2017-09-08 DIAGNOSIS — H20021 Recurrent acute iridocyclitis, right eye: Secondary | ICD-10-CM | POA: Diagnosis not present

## 2017-09-08 DIAGNOSIS — H31091 Other chorioretinal scars, right eye: Secondary | ICD-10-CM | POA: Diagnosis not present

## 2017-09-08 DIAGNOSIS — H401131 Primary open-angle glaucoma, bilateral, mild stage: Secondary | ICD-10-CM | POA: Diagnosis not present

## 2017-09-08 DIAGNOSIS — Z961 Presence of intraocular lens: Secondary | ICD-10-CM | POA: Diagnosis not present

## 2017-10-01 ENCOUNTER — Other Ambulatory Visit: Payer: Self-pay | Admitting: Internal Medicine

## 2017-10-01 DIAGNOSIS — E785 Hyperlipidemia, unspecified: Principal | ICD-10-CM

## 2017-10-01 DIAGNOSIS — E1169 Type 2 diabetes mellitus with other specified complication: Secondary | ICD-10-CM

## 2017-10-02 DIAGNOSIS — Z23 Encounter for immunization: Secondary | ICD-10-CM | POA: Diagnosis not present

## 2017-10-12 DIAGNOSIS — K1321 Leukoplakia of oral mucosa, including tongue: Secondary | ICD-10-CM | POA: Diagnosis not present

## 2017-11-19 DIAGNOSIS — Z23 Encounter for immunization: Secondary | ICD-10-CM | POA: Diagnosis not present

## 2017-12-17 ENCOUNTER — Other Ambulatory Visit: Payer: Medicare Other

## 2017-12-17 DIAGNOSIS — I10 Essential (primary) hypertension: Secondary | ICD-10-CM | POA: Diagnosis not present

## 2017-12-17 DIAGNOSIS — E1149 Type 2 diabetes mellitus with other diabetic neurological complication: Secondary | ICD-10-CM | POA: Diagnosis not present

## 2017-12-17 DIAGNOSIS — E785 Hyperlipidemia, unspecified: Secondary | ICD-10-CM | POA: Diagnosis not present

## 2017-12-17 DIAGNOSIS — E1169 Type 2 diabetes mellitus with other specified complication: Secondary | ICD-10-CM

## 2017-12-18 LAB — LIPID PANEL
Cholesterol: 118 mg/dL (ref <200)
HDL: 36 mg/dL — ABNORMAL LOW (ref >50)
LDL Cholesterol (Calc): 63 mg/dL (calc)
Non-HDL Cholesterol (Calc): 82 mg/dL (calc) (ref <130)
Total CHOL/HDL Ratio: 3.3 (calc) (ref <5.0)
Triglycerides: 107 mg/dL (ref <150)

## 2017-12-18 LAB — BASIC METABOLIC PANEL
BUN: 15 mg/dL (ref 7–25)
CO2: 27 mmol/L (ref 20–32)
Calcium: 9.8 mg/dL (ref 8.6–10.4)
Chloride: 104 mmol/L (ref 98–110)
Creat: 0.91 mg/dL (ref 0.60–0.93)
Glucose, Bld: 115 mg/dL — ABNORMAL HIGH (ref 65–99)
Potassium: 4.5 mmol/L (ref 3.5–5.3)
Sodium: 139 mmol/L (ref 135–146)

## 2017-12-18 LAB — HEMOGLOBIN A1C
Hgb A1c MFr Bld: 6.2 % of total Hgb — ABNORMAL HIGH (ref <5.7)
Mean Plasma Glucose: 131 (calc)
eAG (mmol/L): 7.3 (calc)

## 2017-12-20 ENCOUNTER — Encounter: Payer: Self-pay | Admitting: *Deleted

## 2017-12-23 ENCOUNTER — Encounter: Payer: Self-pay | Admitting: Nurse Practitioner

## 2017-12-23 ENCOUNTER — Ambulatory Visit: Payer: Medicare Other | Admitting: Internal Medicine

## 2017-12-23 ENCOUNTER — Ambulatory Visit (INDEPENDENT_AMBULATORY_CARE_PROVIDER_SITE_OTHER): Payer: Medicare Other | Admitting: Nurse Practitioner

## 2017-12-23 VITALS — BP 124/82 | HR 90 | Temp 98.1°F | Ht 66.0 in | Wt 220.0 lb

## 2017-12-23 DIAGNOSIS — Z1212 Encounter for screening for malignant neoplasm of rectum: Secondary | ICD-10-CM | POA: Diagnosis not present

## 2017-12-23 DIAGNOSIS — Z6835 Body mass index (BMI) 35.0-35.9, adult: Secondary | ICD-10-CM

## 2017-12-23 DIAGNOSIS — E1169 Type 2 diabetes mellitus with other specified complication: Secondary | ICD-10-CM

## 2017-12-23 DIAGNOSIS — F17209 Nicotine dependence, unspecified, with unspecified nicotine-induced disorders: Secondary | ICD-10-CM | POA: Diagnosis not present

## 2017-12-23 DIAGNOSIS — Z716 Tobacco abuse counseling: Secondary | ICD-10-CM | POA: Diagnosis not present

## 2017-12-23 DIAGNOSIS — I10 Essential (primary) hypertension: Secondary | ICD-10-CM

## 2017-12-23 DIAGNOSIS — E785 Hyperlipidemia, unspecified: Secondary | ICD-10-CM | POA: Diagnosis not present

## 2017-12-23 DIAGNOSIS — E1149 Type 2 diabetes mellitus with other diabetic neurological complication: Secondary | ICD-10-CM

## 2017-12-23 DIAGNOSIS — M25561 Pain in right knee: Secondary | ICD-10-CM

## 2017-12-23 DIAGNOSIS — G8929 Other chronic pain: Secondary | ICD-10-CM

## 2017-12-23 DIAGNOSIS — Z1211 Encounter for screening for malignant neoplasm of colon: Secondary | ICD-10-CM

## 2017-12-23 DIAGNOSIS — M25562 Pain in left knee: Secondary | ICD-10-CM

## 2017-12-23 NOTE — Patient Instructions (Signed)
Follow up in 4 months with Dr Mariea Clonts  [facility] Eating Plan DASH stands for "Dietary Approaches to Stop Hypertension." The DASH eating plan is a healthy eating plan that has been shown to reduce high blood pressure (hypertension). It may also reduce your risk for type 2 diabetes, heart disease, and stroke. The DASH eating plan may also help with weight loss. What are tips for following this plan? General guidelines  Avoid eating more than 2,300 mg (milligrams) of salt (sodium) a day. If you have hypertension, you may need to reduce your sodium intake to 1,500 mg a day.  Limit alcohol intake to no more than 1 drink a day for nonpregnant women and 2 drinks a day for men. One drink equals 12 oz of beer, 5 oz of wine, or 1 oz of hard liquor.  Work with your health care provider to maintain a healthy body weight or to lose weight. Ask what an ideal weight is for you.  Get at least 30 minutes of exercise that causes your heart to beat faster (aerobic exercise) most days of the week. Activities may include walking, swimming, or biking.  Work with your health care provider or diet and nutrition specialist (dietitian) to adjust your eating plan to your individual calorie needs. Reading food labels  Check food labels for the amount of sodium per serving. Choose foods with less than 5 percent of the Daily Value of sodium. Generally, foods with less than 300 mg of sodium per serving fit into this eating plan.  To find whole grains, look for the word "whole" as the first word in the ingredient list. Shopping  Buy products labeled as "low-sodium" or "no salt added."  Buy fresh foods. Avoid canned foods and premade or frozen meals. Cooking  Avoid adding salt when cooking. Use salt-free seasonings or herbs instead of table salt or sea salt. Check with your health care provider or pharmacist before using salt substitutes.  Do not fry foods. Cook foods using healthy methods such as baking, boiling, grilling,  and broiling instead.  Cook with heart-healthy oils, such as olive, canola, soybean, or sunflower oil. Meal planning   Eat a balanced diet that includes: ? 5 or more servings of fruits and vegetables each day. At each meal, try to fill half of your plate with fruits and vegetables. ? Up to 6-8 servings of whole grains each day. ? Less than 6 oz of lean meat, poultry, or fish each day. A 3-oz serving of meat is about the same size as a deck of cards. One egg equals 1 oz. ? 2 servings of low-fat dairy each day. ? A serving of nuts, seeds, or beans 5 times each week. ? Heart-healthy fats. Healthy fats called Omega-3 fatty acids are found in foods such as flaxseeds and coldwater fish, like sardines, salmon, and mackerel.  Limit how much you eat of the following: ? Canned or prepackaged foods. ? Food that is high in trans fat, such as fried foods. ? Food that is high in saturated fat, such as fatty meat. ? Sweets, desserts, sugary drinks, and other foods with added sugar. ? Full-fat dairy products.  Do not salt foods before eating.  Try to eat at least 2 vegetarian meals each week.  Eat more home-cooked food and less restaurant, buffet, and fast food.  When eating at a restaurant, ask that your food be prepared with less salt or no salt, if possible. What foods are recommended? The items listed may not be  a complete list. Talk with your dietitian about what dietary choices are best for you. Grains Whole-grain or whole-wheat bread. Whole-grain or whole-wheat pasta. Brown rice. [place]. Bulgur. Whole-grain and low-sodium cereals. Pita bread. Low-fat, low-sodium crackers. Whole-wheat flour tortillas. Vegetables Fresh or frozen vegetables (raw, steamed, roasted, or grilled). Low-sodium or reduced-sodium tomato and vegetable juice. Low-sodium or reduced-sodium tomato sauce and tomato paste. Low-sodium or reduced-sodium canned vegetables. Fruits All fresh, dried, or frozen fruit.  Canned fruit in natural juice (without added sugar). Meat and other protein foods Skinless chicken or [place]. Ground chicken or [place]. Pork with fat trimmed off. Fish and seafood. Egg whites. Dried beans, peas, or lentils. Unsalted nuts, nut butters, and seeds. Unsalted canned beans. Lean cuts of beef with fat trimmed off. Low-sodium, lean deli meat. Dairy Low-fat (1%) or fat-free (skim) milk. Fat-free, low-fat, or reduced-fat cheeses. Nonfat, low-sodium ricotta or cottage cheese. Low-fat or nonfat yogurt. Low-fat, low-sodium cheese. Fats and oils Soft margarine without trans fats. Vegetable oil. Low-fat, reduced-fat, or light mayonnaise and salad dressings (reduced-sodium). Canola, safflower, olive, soybean, and sunflower oils. Avocado. Seasoning and other foods Herbs. Spices. Seasoning mixes without salt. Unsalted popcorn and pretzels. Fat-free sweets. What foods are not recommended? The items listed may not be a complete list. Talk with your dietitian about what dietary choices are best for you. Grains Baked goods made with fat, such as croissants, muffins, or some breads. Dry pasta or rice meal packs. Vegetables Creamed or fried vegetables. Vegetables in a cheese sauce. Regular canned vegetables (not low-sodium or reduced-sodium). Regular canned tomato sauce and paste (not low-sodium or reduced-sodium). Regular tomato and vegetable juice (not low-sodium or reduced-sodium). Angie Fava. Olives. Fruits Canned fruit in a light or heavy syrup. Fried fruit. Fruit in cream or butter sauce. Meat and other protein foods Fatty cuts of meat. Ribs. Fried meat. Berniece Salines. Sausage. Bologna and other processed lunch meats. Salami. Fatback. Hotdogs. Bratwurst. Salted nuts and seeds. Canned beans with added salt. Canned or smoked fish. Whole eggs or egg yolks. Chicken or [place] with skin. Dairy Whole or 2% milk, cream, and half-and-half. Whole or full-fat cream cheese. Whole-fat or sweetened yogurt. Full-fat  cheese. Nondairy creamers. Whipped toppings. Processed cheese and cheese spreads. Fats and oils Butter. Stick margarine. Lard. Shortening. Ghee. Bacon fat. Tropical oils, such as coconut, palm kernel, or palm oil. Seasoning and other foods Salted popcorn and pretzels. Onion salt, garlic salt, seasoned salt, table salt, and sea salt. Worcestershire sauce. Tartar sauce. Barbecue sauce. Teriyaki sauce. Soy sauce, including reduced-sodium. Steak sauce. Canned and packaged gravies. Fish sauce. Oyster sauce. Cocktail sauce. Horseradish that you find on the shelf. Ketchup. Mustard. Meat flavorings and tenderizers. Bouillon cubes. Hot sauce and Tabasco sauce. Premade or packaged marinades. Premade or packaged taco seasonings. Relishes. Regular salad dressings. Where to find more information:  National Heart, Lung, and [place]: https://wilson-eaton.com/  American Heart Association: www.heart.org Summary  The DASH eating plan is a healthy eating plan that has been shown to reduce high blood pressure (hypertension). It may also reduce your risk for type 2 diabetes, heart disease, and stroke.  With the DASH eating plan, you should limit salt (sodium) intake to 2,300 mg a day. If you have hypertension, you may need to reduce your sodium intake to 1,500 mg a day.  When on the DASH eating plan, aim to eat more fresh fruits and vegetables, whole grains, lean proteins, low-fat dairy, and heart-healthy fats.  Work with your health care provider or diet and nutrition specialist (  dietitian) to adjust your eating plan to your individual calorie needs. This information is not intended to replace advice given to you by your health care provider. Make sure you discuss any questions you have with your health care provider. Document Released: 12/18/2010 Document Revised: 12/23/2015 Document Reviewed: 12/23/2015 Elsevier Interactive Patient Education  2018 [employer]. Tobacco Use Disorder Tobacco use disorder (TUD)  is a mental disorder. It is the long-term use of tobacco in spite of related health problems or difficulty with normal life activities. Tobacco is most commonly smoked as cigarettes and less commonly as cigars or pipes. Smokeless chewing tobacco and snuff are also popular. People with TUD get a feeling of extreme pleasure (euphoria) from using tobacco and have a desire to use it again and again. Repeated use of tobacco can cause problems. The addictive effects of tobacco are due mainly tothe ingredient nicotine. Nicotine also causes a rush of adrenaline (epinephrine) in the body. This leads to increased blood pressure, heart rate, and breathing rate. These changes may cause problems for people with high blood pressure, weak hearts, or lung disease. High doses of nicotine in children and pets can lead to seizures and death. Tobacco contains a number of other unsafe chemicals. These chemicals are especially harmful when inhaled as smoke and can damage almost every organ in the body. Smokers live shorter lives than nonsmokers and are at risk of dying from a number of diseases and cancers. Tobacco smoke can also cause health problems for nonsmokers (due to inhaling secondhand smoke). Smoking is also a fire hazard. TUD usually starts in the late teenage years and is most common in young adults between the ages of 69 and 66 years. People who start smoking earlier in life are more likely to continue smoking as adults. TUD is somewhat more common in men than women. People with TUD are at higher risk for using alcohol and other drugs of abuse. What increases the risk? Risk factors for TUD include:  Having family members with the disorder.  Being around people who use tobacco.  Having an existing mental health issue such as schizophrenia, depression, bipolar disorder, ADHD, or posttraumatic stress disorder (PTSD).  What are the signs or symptoms? People with tobacco use disorder have two or more of the  following signs and symptoms within 12 months:  Use of more tobacco over a longer period than intended.  Not able to cut down or control tobacco use.  A lot of time spent obtaining or using tobacco.  Strong desire or urge to use tobacco (craving). Cravings may last for 6 months or longer after quitting.  Use of tobacco even when use leads to major problems at work, school, or home.  Use of tobacco even when use leads to relationship problems.  Giving up or cutting down on important life activities because of tobacco use.  Repeatedly using tobacco in situations where it puts you or others in physical danger, like smoking in bed.  Use of tobacco even when it is known that a physical or mental problem is likely related to tobacco use. ? Physical problems are numerous and may include chronic bronchitis, emphysema, lung and other cancers, gum disease, high blood pressure, heart disease, and stroke. ? Mental problems caused by tobacco may include difficulty sleeping and anxiety.  Need to use greater amounts of tobacco to get the same effect. This means you have developed a tolerance.  Withdrawal symptoms as a result of stopping or rapidly cutting back use. These symptoms  may last a month or more after quitting and include the following: ? Depressed, anxious, or irritable mood. ? Difficulty concentrating. ? Increased appetite. ? Restlessness or trouble sleeping. ? Use of tobacco to avoid withdrawal symptoms.  How is this diagnosed? Tobacco use disorder is diagnosed by your health care provider. A diagnosis may be made by:  Your health care provider asking questions about your tobacco use and any problems it may be causing.  A physical exam.  Lab tests.  You may be referred to a mental health professional or addiction specialist.  The severity of tobacco use disorder depends on the number of signs and symptoms you have:  Mild-Two or three symptoms.  Moderate-Four or five  symptoms.  Severe-Six or more symptoms.  How is this treated? Many people with tobacco use disorder are unable to quit on their own and need help. Treatment options include the following:  Nicotine replacement therapy (NRT). NRT provides nicotine without the other harmful chemicals in tobacco. NRT gradually lowers the dosage of nicotine in the body and reduces withdrawal symptoms. NRT is available in over-the-counter forms (gum, lozenges, and skin patches) as well as prescription forms (mouth inhaler and nasal spray).  Medicines.This may include: ? Antidepressant medicine that may reduce nicotine cravings. ? A medicine that acts on nicotine receptors in the brain to reduce cravings and withdrawal symptoms. It may also block the effects of tobacco in people with TUD who relapse.  Counseling or talk therapy. A form of talk therapy called behavioral therapy is commonly used to treat people with TUD. Behavioral therapy looks at triggers for tobacco use, how to avoid them, and how to cope with cravings. It is most effective in person or by phone but is also available in self-help forms (books and Internet websites).  Support groups. These provide emotional support, advice, and guidance for quitting tobacco.  The most effective treatment for TUD is usually a combination of medicine, talk therapy, and support groups. Follow these instructions at home:  Keep all follow-up visits as directed by your health care provider. This is important.  Take medicines only as directed by your health care provider.  Check with your health care provider before starting new prescription or over-the-counter medicines. Contact a health care provider if:  You are not able to take your medicines as prescribed.  Treatment is not helping your TUD and your symptoms get worse. Get help right away if:  You have serious thoughts about hurting yourself or others.  You have trouble breathing, chest pain, sudden  weakness, or sudden numbness in part of your body. This information is not intended to replace advice given to you by your health care provider. Make sure you discuss any questions you have with your health care provider. Document Released: 09/04/2003 Document Revised: 09/01/2015 Document Reviewed: 02/24/2013 Elsevier Interactive Patient Education  [employer].

## 2017-12-23 NOTE — Progress Notes (Signed)
Careteam: Patient Care Team: Gayland Curry, DO as PCP - General (Geriatric Medicine) Shirley Muscat Loreen Freud, MD as Referring Physician (Optometry) Latanya Maudlin, MD as Consulting Physician (Orthopedic Surgery) Warden Fillers, MD as Consulting Physician (Ophthalmology)  Advanced Directive information Does Patient Have a Medical Advance Directive?: No  Allergies  Allergen Reactions  . Aspirin Nausea Only    Can take 81 mg but can't take 325mg     Chief Complaint  Patient presents with  . Medical Management of Chronic Issues    Pt is being seen for a 4 month routine visit.      HPI: Patient is a 72 y.o. female seen in the office today for routine follow up.  Smoking cessation- continues to cut back but not more than when she had been seen last time. Has tried patches but they were not successful. Not willing to try medication to try to quit but states she is motivated.   DM- controlled on metformin, A1c 6.2 on recent labs. On ACEi  Hyperlipidemia- taking lipitor, LDL at goal at 63, not making dietary modifications.   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 Following with orthopedics   Review of Systems:  Review of Systems  Constitutional: Negative for chills, fever and malaise/fatigue.  HENT: Negative for congestion.   Eyes: Positive for blurred vision.       Following with ophthalmologist   Respiratory: Negative for cough and shortness of breath.   Cardiovascular: Negative for chest pain, palpitations and leg swelling.  Gastrointestinal: Negative for abdominal pain, constipation and diarrhea.  Genitourinary: Negative for dysuria.  Musculoskeletal: Positive for back pain and joint pain. Negative for falls.       Back pain if she does a lot of lifting.   Skin: Negative for itching and rash.  Neurological: Negative for dizziness and loss of  consciousness.  Endo/Heme/Allergies: Does not bruise/bleed easily.  Psychiatric/Behavioral: Negative for depression and memory loss. The patient is not nervous/anxious and does not have insomnia.     Past Medical History:  Diagnosis Date  . Benign essential hypertension   . Diabetes mellitus without complication ([place])   . Diverticulitis of colon (without mention of hemorrhage)(562.11)   . Goiter, specified as simple   . Hyperlipidemia LDL goal < 100   . Numbness    TOES RT FOOT and toes left foot  . Obesity, unspecified   . Osteoarthrosis, unspecified whether generalized or localized, lower leg   . Osteopenia   . Other specified disease of nail    FUNGUS OF FINGERNAILS  . Pernicious anemia   . Unspecified glaucoma(365.9)   . Unspecified vitamin D deficiency    Past Surgical History:  Procedure Laterality Date  . BACK SURGERY    . CATARACT EXTRACTION     right eye 06/20/15 left eye 09/12/15  . COLECTOMY  2009  . JOINT REPLACEMENT     right  . LUMBAR LAMINECTOMY/DECOMPRESSION MICRODISCECTOMY N/A 03/30/2013   Procedure: CENTRAL DECOMPRESSION L4 - L5 AND EXCISION 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;  Surgeon: Tobi Bastos, MD;  Location: [facility];  Service: Orthopedics;  Laterality: N/A;  . TOTAL KNEE ARTHROPLASTY Right 02/01/2013   Procedure: RIGHT TOTAL KNEE ARTHROPLASTY;  Surgeon: Tobi Bastos, MD;  Location: [facility];  Service: Orthopedics;  Laterality: Right;  . TOTAL KNEE ARTHROPLASTY Left 04/18/2014   Procedure: LEFT TOTAL KNEE ARTHROPLASTY;  Surgeon:  Latanya Maudlin, MD;  Location: [facility];  Service: Orthopedics;  Laterality: Left;   Social History:   reports that she has been smoking. She has a 22.50 pack-year smoking history. She has never used smokeless tobacco. She reports that she does not drink alcohol or use drugs.  Family History  Problem Relation Age of Onset  . Alzheimer's disease Mother   . Stroke Mother   . Hyperlipidemia Mother   . Heart disease Father   . Diabetes  Sister     Medications: Patient's Medications  New Prescriptions   No medications on file  Previous Medications   ACETAMINOPHEN (TYLENOL EXTRA STRENGTH PO)    Take 2 tablets by mouth 2 (two) times daily as needed.   ASPIRIN EC 81 MG TABLET    Take 81 mg by mouth daily.   ATORVASTATIN (LIPITOR) 40 MG TABLET    TAKE 1 TABLET BY MOUTH ONCE DAILY AT  6PM   CALCIUM CARBONATE-VITAMIN D (OYSTER SHELL CALCIUM/D) 250-125 MG-UNIT TABS    Take 1 tablet by mouth.   CARBOXYMETHYLCELLULOSE (REFRESH PLUS) 0.5 % SOLN    Place 1 drop into both eyes 3 (three) times daily as needed (dry eyes).    CHOLECALCIFEROL (VITAMIN D-3) 1000 UNITS CAPS    Take by mouth daily.   COMBIGAN 0.2-0.5 % OPHTHALMIC SOLUTION    INT 1 GTT INTO OU BID   LATANOPROST (XALATAN) 0.005 % OPHTHALMIC SOLUTION    Place 1 drop into both eyes at bedtime.   LISINOPRIL (PRINIVIL,ZESTRIL) 5 MG TABLET    TAKE 1 TABLET BY MOUTH ONCE DAILY   METFORMIN (GLUCOPHAGE) 500 MG TABLET    TAKE 1/2 TABLET BY MOUTH IN THE EVENING AT SUPPER   MULTIPLE VITAMIN (MULTIVITAMIN) TABLET    Take 1 tablet by mouth daily.   OMEGA-3 ACID ETHYL ESTERS (LOVAZA) 1 G CAPSULE    Take 1 g by mouth daily.  Modified Medications   No medications on file  Discontinued Medications   No medications on file     Physical Exam:  Vitals:   12/23/17 1110  BP: 124/82  Pulse: 90  Temp: 98.1 F (36.7 C)  TempSrc: Oral  SpO2: 96%  Weight: 220 lb (99.8 kg)  Height: 5\' 6"  (1.676 m)   Body mass index is 35.51 kg/m.  Physical Exam Constitutional:      General: She is not in acute distress.    Appearance: She is well-developed.  HENT:     Head: Normocephalic and atraumatic.  Eyes:     Comments: glasses  Cardiovascular:     Rate and Rhythm: Normal rate and regular rhythm.     Heart sounds: Normal heart sounds.  Pulmonary:     Effort: Pulmonary effort is normal. No respiratory distress.     Breath sounds: Normal breath sounds.  Abdominal:     General: Bowel  sounds are normal.  Musculoskeletal: Normal range of motion.        General: Tenderness present.     Comments: Bilateral knees  Skin:    General: Skin is warm and dry.     Capillary Refill: Capillary refill takes less than 2 seconds.  Neurological:     Mental Status: She is alert and oriented to person, place, and time.     Labs reviewed: Basic Metabolic Panel: Recent Labs    04/26/17 0845 08/16/17 0812 12/17/17 0807  NA 139 140 139  K 4.3 4.4 4.5  CL 104 105 104  CO2 29 28 27  GLUCOSE 112* 115* 115*  BUN 11 12 15   CREATININE 0.91 0.88 0.91  CALCIUM 10.1 9.8 9.8   Liver Function Tests: Recent Labs    04/26/17 0845 08/16/17 0812  AST 16 17  ALT 16 18  BILITOT 0.5 0.6  PROT 7.3 7.2   No results for input(s): LIPASE, AMYLASE in the last 8760 hours. No results for input(s): AMMONIA in the last 8760 hours. CBC: Recent Labs    04/26/17 0845 08/16/17 0812  WBC 10.1 10.1  NEUTROABS 6,888 7,110  HGB 12.6 12.5  HCT 37.1 37.4  MCV 87.1 89.0  PLT 320 326   Lipid Panel: Recent Labs    08/16/17 0812 12/17/17 0807  CHOL 125 118  HDL 37* 36*  LDLCALC 67 63  TRIG 119 107  CHOLHDL 3.4 3.3   TSH: No results for input(s): TSH in the last 8760 hours. A1C: Lab Results  Component Value Date   HGBA1C 6.2 (H) 12/17/2017     Assessment/Plan 1. Screening for colorectal cancer - Ambulatory referral to Gastroenterology  2. Class 2 severe obesity due to excess calories with serious comorbidity and body mass index (BMI) of 35.0 to 35.9 in adult [facility]) Has gained weight, discussed dietary changes as well as increase in activity.   3. Tobacco use disorder, continuous Ongoing, has not cut down additionally. Encouraged cessation.   4. Tobacco abuse counseling Discussed cessation and setting goals, benefits and ways to quit  5. Type 2 diabetes mellitus with neurological complications ([facility]) -stable, continues on metformin with ACEi, statin and ASA -encouraged dietary  modifications.   6. Hyperlipidemia associated with type 2 diabetes mellitus ([place]) -stable, LDL at goal -continues on lipitor.   7. Benign essential hypertension -stable, continues on lisinopril.   8. Chronic pain of both knees Stable, continue on tylenol BID with topical rubs.   Next appt: 4 months, prefers to get blood work at visit.  Carlos American. [place], [facility] [phone]

## 2018-01-07 ENCOUNTER — Encounter: Payer: Self-pay | Admitting: Gastroenterology

## 2018-01-10 DIAGNOSIS — Z961 Presence of intraocular lens: Secondary | ICD-10-CM | POA: Diagnosis not present

## 2018-01-10 DIAGNOSIS — H31091 Other chorioretinal scars, right eye: Secondary | ICD-10-CM | POA: Diagnosis not present

## 2018-01-10 DIAGNOSIS — H20021 Recurrent acute iridocyclitis, right eye: Secondary | ICD-10-CM | POA: Diagnosis not present

## 2018-01-10 DIAGNOSIS — H26493 Other secondary cataract, bilateral: Secondary | ICD-10-CM | POA: Diagnosis not present

## 2018-01-10 DIAGNOSIS — H401131 Primary open-angle glaucoma, bilateral, mild stage: Secondary | ICD-10-CM | POA: Diagnosis not present

## 2018-01-21 ENCOUNTER — Other Ambulatory Visit: Payer: Self-pay | Admitting: Internal Medicine

## 2018-02-02 ENCOUNTER — Ambulatory Visit (AMBULATORY_SURGERY_CENTER): Payer: Self-pay

## 2018-02-02 VITALS — Ht 65.0 in | Wt 225.0 lb

## 2018-02-02 DIAGNOSIS — Z1211 Encounter for screening for malignant neoplasm of colon: Secondary | ICD-10-CM

## 2018-02-02 NOTE — Progress Notes (Signed)
No egg or soy allergy known to patient  No issues with past sedation with any surgeries  or procedures, no intubation problems  No diet pills per patient No home 02 use per patient  No blood thinners per patient  Pt denies issues with constipation  No A fib or A flutter  EMMI video sent to pt's e mail the patient declined

## 2018-02-16 ENCOUNTER — Encounter: Payer: Self-pay | Admitting: Gastroenterology

## 2018-02-16 ENCOUNTER — Ambulatory Visit (AMBULATORY_SURGERY_CENTER): Payer: Medicare Other | Admitting: Gastroenterology

## 2018-02-16 VITALS — BP 94/66 | HR 71 | Temp 98.0°F | Resp 13 | Ht 65.0 in | Wt 225.0 lb

## 2018-02-16 DIAGNOSIS — E119 Type 2 diabetes mellitus without complications: Secondary | ICD-10-CM | POA: Diagnosis not present

## 2018-02-16 DIAGNOSIS — I10 Essential (primary) hypertension: Secondary | ICD-10-CM | POA: Diagnosis not present

## 2018-02-16 DIAGNOSIS — D128 Benign neoplasm of rectum: Secondary | ICD-10-CM

## 2018-02-16 DIAGNOSIS — D129 Benign neoplasm of anus and anal canal: Secondary | ICD-10-CM

## 2018-02-16 DIAGNOSIS — Z1211 Encounter for screening for malignant neoplasm of colon: Secondary | ICD-10-CM

## 2018-02-16 HISTORY — PX: SIGMOIDOSCOPY: SUR1295

## 2018-02-16 MED ORDER — SODIUM CHLORIDE 0.9 % IV SOLN: 500.0000 mL | Freq: Once | INTRAVENOUS | Status: DC

## 2018-02-16 NOTE — Patient Instructions (Signed)
Handout on hemorrhoids and polyps   YOU HAD AN ENDOSCOPIC PROCEDURE TODAY AT [place] [facility]:   Refer to the procedure report that was given to you for any specific questions about what was found during the examination.  If the procedure report does not answer your questions, please call your gastroenterologist to clarify.  If you requested that your care partner not be given the details of your procedure findings, then the procedure report has been included in a sealed envelope for you to review at your convenience later.  YOU SHOULD EXPECT: Some feelings of bloating in the abdomen. Passage of more gas than usual.  Walking can help get rid of the air that was put into your GI tract during the procedure and reduce the bloating. If you had a lower endoscopy (such as a colonoscopy or flexible sigmoidoscopy) you may notice spotting of blood in your stool or on the toilet paper. If you underwent a bowel prep for your procedure, you may not have a normal bowel movement for a few days.  Please Note:  You might notice some irritation and congestion in your nose or some drainage.  This is from the oxygen used during your procedure.  There is no need for concern and it should clear up in a day or so.  SYMPTOMS TO REPORT IMMEDIATELY:   Following lower endoscopy (colonoscopy or flexible sigmoidoscopy):  Excessive amounts of blood in the stool  Significant tenderness or worsening of abdominal pains  Swelling of the abdomen that is new, acute  Fever of 100F or higher   For urgent or emergent issues, a gastroenterologist can be reached at any hour by calling [phone].   DIET:  We do recommend a small meal at first, but then you may proceed to your regular diet.  Drink plenty of fluids but you should avoid alcoholic beverages for 24 hours.  ACTIVITY:  You should plan to take it easy for the rest of today and you should NOT DRIVE or use heavy machinery until tomorrow (because of the  sedation medicines used during the test).    FOLLOW UP: Our staff will call the number listed on your records the next business day following your procedure to check on you and address any questions or concerns that you may have regarding the information given to you following your procedure. If we do not reach you, we will leave a message.  However, if you are feeling well and you are not experiencing any problems, there is no need to return our call.  We will assume that you have returned to your regular daily activities without incident.  If any biopsies were taken you will be contacted by phone or by letter within the next 1-3 weeks.  Please call us at [phone] if you have not heard about the biopsies in 3 weeks.    SIGNATURES/CONFIDENTIALITY: You and/or your care partner have signed paperwork which will be entered into your electronic medical record.  These signatures attest to the fact that that the information above on your After Visit Summary has been reviewed and is understood.  Full responsibility of the confidentiality of this discharge information lies with you and/or your care-partner.

## 2018-02-16 NOTE — Progress Notes (Signed)
Called to room to assist during endoscopic procedure.  Patient ID and intended procedure confirmed with present staff. Received instructions for my participation in the procedure from the performing physician.  

## 2018-02-16 NOTE — Progress Notes (Signed)
Report to PACU, RN, vss, BBS= Clear.  

## 2018-02-16 NOTE — Op Note (Signed)
[facility] Patient Name: Alison [name] Procedure Date: 02/16/2018 9:27 AM MRN: [id number] Endoscopist: Ladene Artist , MD Age: 73 Referring MD:  Date of Birth: August 14, 1945 Gender: Female Account #: [id number] Procedure:                Flexible Sigmoidoscopy Indications:              Screening for colorectal malignant neoplasm. S/P                            subtotal coloectomy in 2009. Medicines:                Monitored Anesthesia Care Procedure:                Pre-Anesthesia Assessment:                           - Prior to the procedure, a History and Physical                            was performed, and patient medications and                            allergies were reviewed. The patient's tolerance of                            previous anesthesia was also reviewed. The risks                            and benefits of the procedure and the sedation                            options and risks were discussed with the patient.                            All questions were answered, and informed consent                            was obtained. Prior Anticoagulants: The patient has                            taken no previous anticoagulant or antiplatelet                            agents. ASA Grade Assessment: II - A patient with                            mild systemic disease. After reviewing the risks                            and benefits, the patient was deemed in                            satisfactory condition to undergo the procedure.  After obtaining informed consent, the scope was                            passed under direct vision. The Colonoscope was                            introduced through the anus and advanced to the the                            ileo-sigmoid anastomosis. After obtaining informed                            consent, the scope was passed under direct vision.                            The flexible  sigmoidoscopy was accomplished without                            difficulty. The patient tolerated the procedure                            well. The quality of the bowel preparation was good. Scope In: 9:34:51 AM Scope Out: 9:40:16 AM Scope Withdrawal Time: 0 hours 4 minutes 48 seconds  Total Procedure Duration: 0 hours 5 minutes 25 seconds  Findings:                 The perianal and digital rectal examinations were                            normal.                           A 6 mm polyp was found in the rectum. The polyp was                            sessile. The polyp was removed with a cold snare.                            Resection and retrieval were complete.                           There was evidence of a prior end-to-side                            ileo-colonic anastomosis at 25 cm in the sigmoid                            colon. This was patent and was characterized by                            healthy appearing mucosa. One diverticulum at the                            anastomosis. The anastomosis  was traversed.                           Internal hemorrhoids were found during                            retroflexion. The hemorrhoids were small and Grade                            I (internal hemorrhoids that do not prolapse).                           The exam was otherwise without abnormality. Complications:            No immediate complications. Estimated Blood Loss:     Estimated blood loss: none. Impression:               - One 6 mm polyp in the rectum, removed with a cold                            snare. Resected and retrieved.                           - Patent end-to-side ileo-colonic anastomosis, with                            one diverticulum, characterized by healthy                            appearing mucosa.                           - Internal hemorrhoids.                           - The examination was otherwise normal. Recommendation:           -  Patient has a contact number available for                            emergencies. The signs and symptoms of potential                            delayed complications were discussed with the                            patient. Return to normal activities tomorrow.                            Written discharge instructions were provided to the                            patient.                           - Resume previous diet today.                           -  Await pathology results.                           - Repeat flexible sigmoidoscopy in 5 years for                            surveillance if polyp is precancerous, otherwise                            flexible sigmoidoscopy in 10 years. Ladene Artist, MD 02/16/2018 9:47:34 AM This report has been signed electronically.

## 2018-02-16 NOTE — Progress Notes (Signed)
Pt's states no medical or surgical changes since previsit or office visit. 

## 2018-02-17 ENCOUNTER — Telehealth: Payer: Self-pay

## 2018-02-17 NOTE — Telephone Encounter (Signed)
  Follow up Call-  Call back number 02/16/2018  Post procedure Call Back phone  # [phone]  Permission to leave phone message Yes  Some recent data might be hidden     Patient questions:  Do you have a fever, pain , or abdominal swelling? No. Pain Score  0 *  Have you tolerated food without any problems? Yes.    Have you been able to return to your normal activities? Yes.    Do you have any questions about your discharge instructions: Diet   No. Medications  No. Follow up visit  No.  Do you have questions or concerns about your Care? No.  Actions: * If pain score is 4 or above: No action needed, pain <4.

## 2018-02-23 ENCOUNTER — Other Ambulatory Visit: Payer: Self-pay | Admitting: Internal Medicine

## 2018-02-23 DIAGNOSIS — E1142 Type 2 diabetes mellitus with diabetic polyneuropathy: Secondary | ICD-10-CM

## 2018-02-24 ENCOUNTER — Encounter: Payer: Self-pay | Admitting: Gastroenterology

## 2018-03-01 ENCOUNTER — Encounter: Payer: Self-pay | Admitting: Family

## 2018-03-22 ENCOUNTER — Other Ambulatory Visit: Payer: Self-pay | Admitting: Internal Medicine

## 2018-03-22 DIAGNOSIS — E1169 Type 2 diabetes mellitus with other specified complication: Secondary | ICD-10-CM

## 2018-03-22 DIAGNOSIS — E785 Hyperlipidemia, unspecified: Principal | ICD-10-CM

## 2018-04-29 ENCOUNTER — Ambulatory Visit: Payer: Self-pay

## 2018-05-02 ENCOUNTER — Ambulatory Visit (INDEPENDENT_AMBULATORY_CARE_PROVIDER_SITE_OTHER): Payer: Medicare Other | Admitting: Internal Medicine

## 2018-05-02 ENCOUNTER — Ambulatory Visit: Payer: Self-pay

## 2018-05-02 ENCOUNTER — Other Ambulatory Visit: Payer: Self-pay

## 2018-05-02 ENCOUNTER — Encounter: Payer: Self-pay | Admitting: Internal Medicine

## 2018-05-02 ENCOUNTER — Encounter: Payer: Medicare Other | Admitting: Family

## 2018-05-02 ENCOUNTER — Ambulatory Visit: Payer: Medicare Other | Admitting: Internal Medicine

## 2018-05-02 DIAGNOSIS — K621 Rectal polyp: Secondary | ICD-10-CM | POA: Diagnosis not present

## 2018-05-02 DIAGNOSIS — F17209 Nicotine dependence, unspecified, with unspecified nicotine-induced disorders: Secondary | ICD-10-CM | POA: Diagnosis not present

## 2018-05-02 DIAGNOSIS — Z6835 Body mass index (BMI) 35.0-35.9, adult: Secondary | ICD-10-CM

## 2018-05-02 DIAGNOSIS — E1149 Type 2 diabetes mellitus with other diabetic neurological complication: Secondary | ICD-10-CM

## 2018-05-02 DIAGNOSIS — M48062 Spinal stenosis, lumbar region with neurogenic claudication: Secondary | ICD-10-CM | POA: Diagnosis not present

## 2018-05-02 DIAGNOSIS — K5790 Diverticulosis of intestine, part unspecified, without perforation or abscess without bleeding: Secondary | ICD-10-CM | POA: Diagnosis not present

## 2018-05-02 DIAGNOSIS — M25562 Pain in left knee: Secondary | ICD-10-CM | POA: Diagnosis not present

## 2018-05-02 DIAGNOSIS — K648 Other hemorrhoids: Secondary | ICD-10-CM | POA: Diagnosis not present

## 2018-05-02 DIAGNOSIS — E1169 Type 2 diabetes mellitus with other specified complication: Secondary | ICD-10-CM

## 2018-05-02 DIAGNOSIS — M25561 Pain in right knee: Secondary | ICD-10-CM

## 2018-05-02 DIAGNOSIS — E785 Hyperlipidemia, unspecified: Secondary | ICD-10-CM | POA: Diagnosis not present

## 2018-05-02 DIAGNOSIS — G8929 Other chronic pain: Secondary | ICD-10-CM

## 2018-05-02 NOTE — Progress Notes (Signed)
Patient ID: Alison [name], female   DOB: [birth year], 73 y.o.   MRN: [id number] This service is provided via telemedicine  No vital signs collected/recorded due to the encounter was a telemedicine visit.   Location of patient (ex: home, work):  HOME Patient consents to a telephone visit:  YES Location of the provider (ex: office, home): OFFICE Name of any referring provider:  DR. Hollace Kinnier, DO Names of all persons participating in the telemedicine service and their role in the encounter:  PATIENT, Edwin Dada, CMA, DR Hollace Kinnier, DO  Time spent on call:  3:02    Provider:  Lillith Mcneff L. Mariea Clonts, D.O., C.M.D.   Goals of Care:  Advanced Directives 12/23/2017  Does Patient Have a Medical Advance Directive? No  Would patient like information on creating a medical advance directive? -  Pre-existing out of facility DNR order (yellow form or pink MOST form) -     Chief Complaint  Patient presents with  . TELEPHONE VISIT    4MTH FOLLOW-UP    HPI: Patient is a 73 y.o. female seen today for medical management of chronic diseases.    She is staying in and avoiding getting covid-19.  Only going to grocery store or pharmacy.  They made some masks.  They are sanitizing and doing good handwashing.  Feeling pretty good.  Those old knees don't get better.   She does get on her stationary bike a little bit.  Tries to do 30 minutes each morning at least.  Occasionally, she misses a morning.    Does not check bps at home.    Diet:  She laughed when asked about eating habits, but says she does pretty well.    She's about the same place when was with smoking.    She had her sigmoidoscopy with Dr. Fuller Plan and we reviewed those results.  The sessile polyp removed from her rectum was precancerous so a 5 year f/u was recommended.  She is due for mammogram but she's not going during covid.    Breathing well.  No troubles there.    Glaucoma:  Eyes are doing pretty good.  Has appt coming up  soon.  Needs her glasses to read her tv.   Uses her drops for glaucoma.  Pressure was ok--she was at a stage where he didn't expect it to change.    With her back, she knows she can't lift things or wash the floor or it will flare up.  Uses her tylenol bid for this and her knees.  Past Medical History:  Diagnosis Date  . Benign essential hypertension   . Blood transfusion without reported diagnosis   . Diabetes mellitus without complication ([facility])   . Diverticulitis of colon (without mention of hemorrhage)(562.11)   . Goiter, specified as simple   . Hyperlipidemia LDL goal < 100   . Numbness    TOES RT FOOT and toes left foot  . Obesity, unspecified   . Osteoarthrosis, unspecified whether generalized or localized, lower leg   . Osteopenia   . Other specified disease of nail    FUNGUS OF FINGERNAILS  . Pernicious anemia   . Unspecified glaucoma(365.9)   . Unspecified vitamin D deficiency     Past Surgical History:  Procedure Laterality Date  . BACK SURGERY    . CATARACT EXTRACTION     right eye 06/20/15 left eye 09/12/15  . COLECTOMY  2009  . JOINT REPLACEMENT     right  . LUMBAR LAMINECTOMY/DECOMPRESSION MICRODISCECTOMY N/A  03/30/2013   Procedure: CENTRAL DECOMPRESSION L4 - L5 AND EXCISION 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;  Surgeon: Tobi Bastos, MD;  Location: [facility];  Service: Orthopedics;  Laterality: N/A;  . TOTAL KNEE ARTHROPLASTY Right 02/01/2013   Procedure: RIGHT TOTAL KNEE ARTHROPLASTY;  Surgeon: Tobi Bastos, MD;  Location: [facility];  Service: Orthopedics;  Laterality: Right;  . TOTAL KNEE ARTHROPLASTY Left 04/18/2014   Procedure: LEFT TOTAL KNEE ARTHROPLASTY;  Surgeon: Latanya Maudlin, MD;  Location: [facility];  Service: Orthopedics;  Laterality: Left;    Allergies  Allergen Reactions  . Aspirin Nausea Only    Can take 81 mg but can't take 325mg     Outpatient Encounter Medications as of 05/02/2018  Medication Sig  . Acetaminophen (TYLENOL EXTRA STRENGTH PO) Take 2  tablets by mouth 2 (two) times daily as needed.  Marland Kitchen aspirin EC 81 MG tablet Take 81 mg by mouth daily.  Marland Kitchen atorvastatin (LIPITOR) 40 MG tablet TAKE 1 TABLET BY MOUTH ONCE DAILY AT  6PM  . Calcium Carbonate-Vitamin D (OYSTER SHELL CALCIUM/D) 250-125 MG-UNIT TABS Take 1 tablet by mouth.  . carboxymethylcellulose (REFRESH PLUS) 0.5 % SOLN Place 1 drop into both eyes 3 (three) times daily as needed (dry eyes).   . Cholecalciferol (VITAMIN D-3) 1000 UNITS CAPS Take by mouth daily.  . COMBIGAN 0.2-0.5 % ophthalmic solution INT 1 GTT INTO OU BID  . latanoprost (XALATAN) 0.005 % ophthalmic solution Place 1 drop into both eyes at bedtime.  Marland Kitchen lisinopril (PRINIVIL,ZESTRIL) 5 MG tablet Take 1 tablet by mouth once daily  . metFORMIN (GLUCOPHAGE) 500 MG tablet TAKE 1/2 TABLET BY MOUTH IN THE EVENING AT SUPPER  . Multiple Vitamin (MULTIVITAMIN) tablet Take 1 tablet by mouth daily.  Marland Kitchen omega-3 acid ethyl esters (LOVAZA) 1 g capsule Take 1 g by mouth daily.   No facility-administered encounter medications on file as of 05/02/2018.     Review of Systems:  Review of Systems  Constitutional: Negative for chills, fever and malaise/fatigue.  HENT: Negative for congestion.   Eyes: Negative for blurred vision.       Glasses for tv-watching, glaucoma  Respiratory: Negative for cough, sputum production, shortness of breath and wheezing.   Cardiovascular: Negative for chest pain, palpitations and leg swelling.  Gastrointestinal: Negative for abdominal pain, blood in stool, constipation, diarrhea and melena.       Reviewed sigmoidoscopy results with her  Genitourinary: Negative for dysuria.  Musculoskeletal: Positive for back pain and joint pain. Negative for falls.  Skin: Negative for itching and rash.  Neurological: Positive for tingling and sensory change. Negative for dizziness and loss of consciousness.  Endo/Heme/Allergies: Does not bruise/bleed easily.  Psychiatric/Behavioral: Negative for depression and  memory loss. The patient is not nervous/anxious and does not have insomnia.     Health Maintenance  Topic Date Due  . FOOT EXAM  12/28/2017  . MAMMOGRAM  04/14/2018  . OPHTHALMOLOGY EXAM  04/29/2018  . HEMOGLOBIN A1C  06/18/2018  . INFLUENZA VACCINE  08/13/2018  . COLONOSCOPY  02/17/2023  . TETANUS/TDAP  10/29/2023  . DEXA SCAN  Completed  . Hepatitis C Screening  Completed  . PNA vac Low Risk Adult  Completed    Physical Exam: Could not be performed as visit non face-to-face via phone   Labs reviewed: Basic Metabolic Panel: Recent Labs    08/16/17 0812 12/17/17 0807  NA 140 139  K 4.4 4.5  CL 105 104  CO2 28 27  GLUCOSE 115* 115*  BUN 12 15  CREATININE 0.88 0.91  CALCIUM 9.8 9.8   Liver Function Tests: Recent Labs    08/16/17 0812  AST 17  ALT 18  BILITOT 0.6  PROT 7.2   No results for input(s): LIPASE, AMYLASE in the last 8760 hours. No results for input(s): AMMONIA in the last 8760 hours. CBC: Recent Labs    08/16/17 0812  WBC 10.1  NEUTROABS 7,110  HGB 12.5  HCT 37.4  MCV 89.0  PLT 326   Lipid Panel: Recent Labs    08/16/17 0812 12/17/17 0807  CHOL 125 118  HDL 37* 36*  LDLCALC 67 63  TRIG 119 107  CHOLHDL 3.4 3.3   Lab Results  Component Value Date   HGBA1C 6.2 (H) 12/17/2017    Assessment/Plan 1. Type 2 diabetes mellitus with neurological complications (HCC) - is continuing her regular cycling on stationary bike which I encouraged her to continue -trying to make good choices with her diet -cont current medication regimen which has been effective - CBC with Differential/Platelet; Future - COMPLETE METABOLIC PANEL WITH GFR; Future - Hemoglobin A1c; Future - Lipid panel; Future  2. Class 2 severe obesity due to excess calories with serious comorbidity and body mass index (BMI) of 35.0 to 35.9 in adult [facility]) -counseled on healthy diet and exercise regimen  3. Hyperlipidemia associated with type 2 diabetes mellitus (HCC) -cont  lipitor 40mg  daily, check labs before next visit - Lipid panel; Future  4. Tobacco use disorder, continuous -ongoing, has tried to quit and had temporarily quit in the past, but has not had success lately, is smoking about the same amount as last time  5. Chronic pain of both knees -ongoing due to b/l OA--continue tylenol and topical therapy, regular exercise  6. Spinal stenosis of lumbar region with neurogenic claudication -avoid heavy lifting and activities that exacerbate this -cont tylenol and topical otc   7. Diverticulosis -noted on flex sig with Dr. Fuller Plan -counseled on what this his and what foods to avoid, what to eat  8. Internal hemorrhoids without complication -no related concerns, but identified on flex sig  9. Sessile rectal polyp -noted to be premalignant on path when removed during flex sig -f/u flex sig in 5 years  Labs/tests ordered:   Orders Placed This Encounter  Procedures  . CBC with Differential/Platelet    Standing Status:   Future    Standing Expiration Date:   05/02/2019  . COMPLETE METABOLIC PANEL WITH GFR    Standing Status:   Future    Standing Expiration Date:   05/02/2019  . Hemoglobin A1c    Standing Status:   Future    Standing Expiration Date:   05/02/2019  . Lipid panel    Standing Status:   Future    Standing Expiration Date:   05/02/2019   Next appt:  09/05/2018  Non face-to-face time spent on televisit:  22 minutes  Jeet Shough L. Aaralyn Kil, D.O. [facility] [address], [place] [address] Cell Phone (Mon-Fri 8am-5pm):  [phone] On Call:  [phone] & follow prompts after 5pm & weekends Office Phone:  [phone] Office Fax:  [phone]

## 2018-05-03 ENCOUNTER — Encounter: Payer: Medicare Other | Admitting: Family

## 2018-05-17 ENCOUNTER — Encounter: Payer: Self-pay | Admitting: Family

## 2018-05-18 DIAGNOSIS — H26493 Other secondary cataract, bilateral: Secondary | ICD-10-CM | POA: Diagnosis not present

## 2018-05-18 DIAGNOSIS — Z961 Presence of intraocular lens: Secondary | ICD-10-CM | POA: Diagnosis not present

## 2018-05-18 DIAGNOSIS — H401131 Primary open-angle glaucoma, bilateral, mild stage: Secondary | ICD-10-CM | POA: Diagnosis not present

## 2018-05-18 DIAGNOSIS — E119 Type 2 diabetes mellitus without complications: Secondary | ICD-10-CM | POA: Diagnosis not present

## 2018-06-07 ENCOUNTER — Ambulatory Visit (INDEPENDENT_AMBULATORY_CARE_PROVIDER_SITE_OTHER): Payer: Medicare Other | Admitting: Family

## 2018-06-07 ENCOUNTER — Encounter: Payer: Self-pay | Admitting: Family

## 2018-06-07 ENCOUNTER — Other Ambulatory Visit: Payer: Self-pay

## 2018-06-07 DIAGNOSIS — Z0000 Encounter for general adult medical examination without abnormal findings: Secondary | ICD-10-CM | POA: Diagnosis not present

## 2018-06-07 NOTE — Progress Notes (Signed)
    This service is provided via telemedicine  No vital signs collected/recorded due to the encounter was a telemedicine visit.   Location of patient (ex: home, work):  Home  Patient consents to a telephone visit:  Yes  Location of the provider (ex: office, home):  Home  Name of any referring provider:  Dr. Hollace Kinnier  Names of all persons participating in the telemedicine service and their role in the encounter:  [name]  Time spent on call:  10

## 2018-06-07 NOTE — Patient Instructions (Signed)
Alison [name] , Thank you for taking time to come for your Medicare Wellness Visit. I appreciate your ongoing commitment to your health goals. Please review the following plan we discussed and let me know if I can assist you in the future.   Screening recommendations/referrals: Colonoscopy: Up to date due 02/17/2023  Mammogram Due 2020 please notify provider when ready to go for mammogram. Bone Density: Up to date  Recommended yearly ophthalmology/optometry visit for glaucoma screening and checkup Recommended yearly dental visit for hygiene and checkup  Vaccinations: Influenza vaccine: Up to date  Pneumococcal vaccine: Up to date  Tdap vaccine: Up to date due 10/29/2023  Shingles vaccine: Up to date   Advanced directives: No   Conditions/risks identified: Advance age female > 48 yrs,Hypertension,Type 2 DM,Obesity,smoker  Next appointment: 1 year    Preventive Care 46 Years and Older, Female Preventive care refers to lifestyle choices and visits with your health care provider that can promote health and wellness. What does preventive care include?  A yearly physical exam. This is also called an annual well check.  Dental exams once or twice a year.  Routine eye exams. Ask your health care provider how often you should have your eyes checked.  Personal lifestyle choices, including:  Daily care of your teeth and gums.  Regular physical activity.  Eating a healthy diet.  Avoiding tobacco and drug use.  Limiting alcohol use.  Practicing safe sex.  Taking low-dose aspirin every day.  Taking vitamin and mineral supplements as recommended by your health care provider. What happens during an annual well check? The services and screenings done by your health care provider during your annual well check will depend on your age, overall health, lifestyle risk factors, and family history of disease. Counseling  Your health care provider may ask you questions about your:  Alcohol  use.  Tobacco use.  Drug use.  Emotional well-being.  Home and relationship well-being.  Sexual activity.  Eating habits.  History of falls.  Memory and ability to understand (cognition).  Work and work Statistician.  Reproductive health. Screening  You may have the following tests or measurements:  Height, weight, and BMI.  Blood pressure.  Lipid and cholesterol levels. These may be checked every 5 years, or more frequently if you are over 2 years old.  Skin check.  Lung cancer screening. You may have this screening every year starting at age 48 if you have a 30-pack-year history of smoking and currently smoke or have quit within the past 15 years.  Fecal occult blood test (FOBT) of the stool. You may have this test every year starting at age 107.  Flexible sigmoidoscopy or colonoscopy. You may have a sigmoidoscopy every 5 years or a colonoscopy every 10 years starting at age 50.  Hepatitis C blood test.  Hepatitis B blood test.  Sexually transmitted disease (STD) testing.  Diabetes screening. This is done by checking your blood sugar (glucose) after you have not eaten for a while (fasting). You may have this done every 1-3 years.  Bone density scan. This is done to screen for osteoporosis. You may have this done starting at age 41.  Mammogram. This may be done every 1-2 years. Talk to your health care provider about how often you should have regular mammograms. Talk with your health care provider about your test results, treatment options, and if necessary, the need for more tests. Vaccines  Your health care provider may recommend certain vaccines, such as:  Influenza vaccine.  This is recommended every year.  Tetanus, diphtheria, and acellular pertussis (Tdap, Td) vaccine. You may need a Td booster every 10 years.  Zoster vaccine. You may need this after age 72.  Pneumococcal 13-valent conjugate (PCV13) vaccine. One dose is recommended after age 27.   Pneumococcal polysaccharide (PPSV23) vaccine. One dose is recommended after age 5. Talk to your health care provider about which screenings and vaccines you need and how often you need them. This information is not intended to replace advice given to you by your health care provider. Make sure you discuss any questions you have with your health care provider. Document Released: 01/25/2015 Document Revised: 09/18/2015 Document Reviewed: 10/30/2014 Elsevier Interactive Patient Education  2017 [place] Prevention in the Home Falls can cause injuries. They can happen to people of all ages. There are many things you can do to make your home safe and to help prevent falls. What can I do on the outside of my home?  Regularly fix the edges of walkways and driveways and fix any cracks.  Remove anything that might make you trip as you walk through a door, such as a raised step or threshold.  Trim any bushes or trees on the path to your home.  Use bright outdoor lighting.  Clear any walking paths of anything that might make someone trip, such as rocks or tools.  Regularly check to see if handrails are loose or broken. Make sure that both sides of any steps have handrails.  Any raised decks and porches should have guardrails on the edges.  Have any leaves, snow, or ice cleared regularly.  Use sand or salt on walking paths during winter.  Clean up any spills in your garage right away. This includes oil or grease spills. What can I do in the bathroom?  Use night lights.  Install grab bars by the toilet and in the tub and shower. Do not use towel bars as grab bars.  Use non-skid mats or decals in the tub or shower.  If you need to sit down in the shower, use a plastic, non-slip stool.  Keep the floor dry. Clean up any water that spills on the floor as soon as it happens.  Remove soap buildup in the tub or shower regularly.  Attach bath mats securely with double-sided non-slip  rug tape.  Do not have throw rugs and other things on the floor that can make you trip. What can I do in the bedroom?  Use night lights.  Make sure that you have a light by your bed that is easy to reach.  Do not use any sheets or blankets that are too big for your bed. They should not hang down onto the floor.  Have a firm chair that has side arms. You can use this for support while you get dressed.  Do not have throw rugs and other things on the floor that can make you trip. What can I do in the kitchen?  Clean up any spills right away.  Avoid walking on wet floors.  Keep items that you use a lot in easy-to-reach places.  If you need to reach something above you, use a strong step stool that has a grab bar.  Keep electrical cords out of the way.  Do not use floor polish or wax that makes floors slippery. If you must use wax, use non-skid floor wax.  Do not have throw rugs and other things on the floor that can make  you trip. What can I do with my stairs?  Do not leave any items on the stairs.  Make sure that there are handrails on both sides of the stairs and use them. Fix handrails that are broken or loose. Make sure that handrails are as long as the stairways.  Check any carpeting to make sure that it is firmly attached to the stairs. Fix any carpet that is loose or worn.  Avoid having throw rugs at the top or bottom of the stairs. If you do have throw rugs, attach them to the floor with carpet tape.  Make sure that you have a light switch at the top of the stairs and the bottom of the stairs. If you do not have them, ask someone to add them for you. What else can I do to help prevent falls?  Wear shoes that:  Do not have high heels.  Have rubber bottoms.  Are comfortable and fit you well.  Are closed at the toe. Do not wear sandals.  If you use a stepladder:  Make sure that it is fully opened. Do not climb a closed stepladder.  Make sure that both sides of  the stepladder are locked into place.  Ask someone to hold it for you, if possible.  Clearly mark and make sure that you can see:  Any grab bars or handrails.  First and last steps.  Where the edge of each step is.  Use tools that help you move around (mobility aids) if they are needed. These include:  Canes.  Walkers.  Scooters.  Crutches.  Turn on the lights when you go into a dark area. Replace any light bulbs as soon as they burn out.  Set up your furniture so you have a clear path. Avoid moving your furniture around.  If any of your floors are uneven, fix them.  If there are any pets around you, be aware of where they are.  Review your medicines with your doctor. Some medicines can make you feel dizzy. This can increase your chance of falling. Ask your doctor what other things that you can do to help prevent falls. This information is not intended to replace advice given to you by your health care provider. Make sure you discuss any questions you have with your health care provider. Document Released: 10/25/2008 Document Revised: 06/06/2015 Document Reviewed: 02/02/2014 Elsevier Interactive Patient Education  2017 [employer].

## 2018-06-07 NOTE — Progress Notes (Signed)
Subjective:   Alison [name] is a 73 y.o. female who presents for Medicare Annual (Subsequent) preventive examination.  Review of Systems:   Cardiac Risk Factors include: advanced age (>32men, >65 women);diabetes mellitus;dyslipidemia;hypertension;obesity (BMI >30kg/m2);smoking/ tobacco exposure     Objective:     Vitals: There were no vitals taken for this visit.  There is no height or weight on file to calculate BMI.  Advanced Directives 06/07/2018 12/23/2017 04/29/2017 04/26/2017 12/28/2016 08/17/2016 02/07/2016  Does Patient Have a Medical Advance Directive? No No No No No No No  Would patient like information on creating a medical advance directive? No - Patient declined - Yes (MAU/Ambulatory/Procedural Areas - Information given) Yes (MAU/Ambulatory/Procedural Areas - Information given) No - Patient declined No - Patient declined -  Pre-existing out of facility DNR order (yellow form or pink MOST form) - - - - - - -    Tobacco Social History   Tobacco Use  Smoking Status Current Every Day Smoker  . Packs/day: 0.75  . Years: 30.00  . Pack years: 22.50  Smokeless Tobacco Never Used     Ready to quit: Not Answered Counseling given: Not Answered   Clinical Intake:  Pre-visit preparation completed: No  Pain : 0-10 Pain Score: 7  Pain Type: Chronic pain Pain Location: Knee Pain Orientation: Left, Right Pain Radiating Towards: no  Pain Descriptors / Indicators: Aching Pain Onset: Other (comment)(several years 2015 ) Pain Frequency: Constant Pain Relieving Factors: Tylenol and Rubs ointment  Effect of Pain on Daily Activities: No   Pain Relieving Factors: Tylenol and Rubs ointment   Nutritional Risks: None Diabetes: Yes CBG done?: No Did pt. bring in CBG monitor from home?: No  How often do you need to have someone help you when you read instructions, pamphlets, or other written materials from your doctor or pharmacy?: 1 - Never What is the last grade level you  completed in school?: 12 grade   Interpreter Needed?: No  Information entered by :: Dinah Ngetich FNP-C   Past Medical History:  Diagnosis Date  . Anxiety   . Benign essential hypertension   . Blood transfusion without reported diagnosis   . Diabetes mellitus without complication ([place])   . Diverticulitis of colon (without mention of hemorrhage)(562.11)   . Goiter, specified as simple   . Hyperlipidemia LDL goal < 100   . Numbness    TOES RT FOOT and toes left foot  . Obesity, unspecified   . Osteoarthrosis, unspecified whether generalized or localized, lower leg   . Osteopenia   . Other specified disease of nail    FUNGUS OF FINGERNAILS  . Pernicious anemia   . Unspecified glaucoma(365.9)   . Unspecified vitamin D deficiency    Past Surgical History:  Procedure Laterality Date  . BACK SURGERY    . CATARACT EXTRACTION     right eye 06/20/15 left eye 09/12/15  . COLECTOMY  2009  . JOINT REPLACEMENT     right  . LUMBAR LAMINECTOMY/DECOMPRESSION MICRODISCECTOMY N/A 03/30/2013   Procedure: CENTRAL DECOMPRESSION L4 - L5 AND EXCISION 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;  Surgeon: Tobi Bastos, MD;  Location: [facility];  Service: Orthopedics;  Laterality: N/A;  . TOTAL KNEE ARTHROPLASTY Right 02/01/2013   Procedure: RIGHT TOTAL KNEE ARTHROPLASTY;  Surgeon: Tobi Bastos, MD;  Location: [facility];  Service: Orthopedics;  Laterality: Right;  . TOTAL KNEE ARTHROPLASTY Left 04/18/2014   Procedure: LEFT TOTAL KNEE ARTHROPLASTY;  Surgeon: Latanya Maudlin, MD;  Location: [facility];  Service: Orthopedics;  Laterality: Left;   Family History  Problem Relation Age of Onset  . Alzheimer's disease Mother   . Stroke Mother   . Hyperlipidemia Mother   . Heart disease Father   . Diabetes Sister   . Colon polyps Neg Hx   . Colon cancer Neg Hx   . Esophageal cancer Neg Hx   . Rectal cancer Neg Hx   . Stomach cancer Neg Hx    Social History   Socioeconomic History  . Marital status: Divorced    Spouse  name: Not on file  . Number of children: Not on file  . Years of education: Not on file  . Highest education level: Not on file  Occupational History  . Not on file  Social Needs  . Financial resource strain: Not hard at all  . Food insecurity:    Worry: Never true    Inability: Never true  . Transportation needs:    Medical: No    Non-medical: No  Tobacco Use  . Smoking status: Current Every Day Smoker    Packs/day: 0.75    Years: 30.00    Pack years: 22.50  . Smokeless tobacco: Never Used  Substance and Sexual Activity  . Alcohol use: No  . Drug use: No  . Sexual activity: Never  Lifestyle  . Physical activity:    Days per week: 4 days    Minutes per session: 30 min  . Stress: Only a little  Relationships  . Social connections:    Talks on phone: More than three times a week    Gets together: More than three times a week    Attends religious service: More than 4 times per year    Active member of club or organization: No    Attends meetings of clubs or organizations: Never    Relationship status: Divorced  Other Topics Concern  . Not on file  Social History Narrative  . Not on file    Outpatient Encounter Medications as of 06/07/2018  Medication Sig  . Acetaminophen (TYLENOL EXTRA STRENGTH PO) Take 2 tablets by mouth 2 (two) times daily as needed.  Marland Kitchen aspirin EC 81 MG tablet Take 81 mg by mouth daily.  Marland Kitchen atorvastatin (LIPITOR) 40 MG tablet TAKE 1 TABLET BY MOUTH ONCE DAILY AT  6PM  . Calcium Carbonate-Vitamin D (OYSTER SHELL CALCIUM/D) 250-125 MG-UNIT TABS Take 1 tablet by mouth.  . carboxymethylcellulose (REFRESH PLUS) 0.5 % SOLN Place 1 drop into both eyes 3 (three) times daily as needed (dry eyes).   . Cholecalciferol (VITAMIN D-3) 1000 UNITS CAPS Take by mouth daily.  . COMBIGAN 0.2-0.5 % ophthalmic solution INT 1 GTT INTO OU BID  . latanoprost (XALATAN) 0.005 % ophthalmic solution Place 1 drop into both eyes at bedtime.  Marland Kitchen lisinopril (PRINIVIL,ZESTRIL) 5 MG  tablet Take 1 tablet by mouth once daily  . metFORMIN (GLUCOPHAGE) 500 MG tablet TAKE 1/2 TABLET BY MOUTH IN THE EVENING AT SUPPER  . Multiple Vitamin (MULTIVITAMIN) tablet Take 1 tablet by mouth daily.  Marland Kitchen omega-3 acid ethyl esters (LOVAZA) 1 g capsule Take 1 g by mouth daily.   No facility-administered encounter medications on file as of 06/07/2018.     Activities of Daily Living In your present state of health, do you have any difficulty performing the following activities: 06/07/2018  Hearing? N  Vision? N  Difficulty concentrating or making decisions? N  Walking or climbing stairs? N  Dressing or bathing? N  Doing errands, shopping? N  Preparing Food and eating ? N  Using the Toilet? N  In the past six months, have you accidently leaked urine? Y  Comment urgency feels dripping   Do you have problems with loss of bowel control? N  Managing your Medications? N  Managing your Finances? N  Housekeeping or managing your Housekeeping? N  Some recent data might be hidden    Patient Care Team: Gayland Curry, DO as PCP - General (Geriatric Medicine) Shirley Muscat Loreen Freud, MD as Referring Physician (Optometry) Latanya Maudlin, MD as Consulting Physician (Orthopedic Surgery) Warden Fillers, MD as Consulting Physician (Ophthalmology)    Assessment:   This is a routine wellness examination for [place].  Exercise Activities and Dietary recommendations Current Exercise Habits: Home exercise routine, Type of exercise: Other - see comments(In home bike ), Time (Minutes): 30, Frequency (Times/Week): 7, Weekly Exercise (Minutes/Week): 210, Intensity: Moderate, Exercise limited by: None identified  Goals    . Quit Smoking     Patient will try to incorporate new activites and hobbies to help decrease smoking    . Quit smoking / using tobacco    . Weight < 200 lb (90.719 kg)     Try to get back down below 200 lbs for next visit.  Continue cycling and try to eat better (with handouts  provided).       Fall Risk Fall Risk  06/07/2018 05/02/2018 12/23/2017 08/19/2017 04/29/2017  Falls in the past year? 0 0 0 No No  Number falls in past yr: 0 0 - - -  Injury with Fall? 0 0 - - -  Risk for fall due to : - - - - -   Is the patient's home free of loose throw rugs in walkways, pet beds, electrical cords, etc?   no      Grab bars in the bathroom? yes      Handrails on the stairs?   yes      Adequate lighting?   yes  Depression Screen PHQ 2/9 Scores 06/07/2018 05/02/2018 08/19/2017 04/26/2017  PHQ - 2 Score 0 0 0 0  Exception Documentation Patient refusal - - -     Cognitive Function MMSE - Mini Mental State Exam 04/26/2017 02/03/2016  Orientation to time 5 5  Orientation to Place 5 5  Registration 3 3  Attention/ Calculation 5 5  Recall 3 2  Language- name 2 objects 2 2  Language- repeat 1 1  Language- follow 3 step command 3 3  Language- read & follow direction 1 1  Write a sentence 1 1  Copy design 1 1  Total score 30 29     6CIT Screen 06/07/2018 06/07/2018  What Year? 0 points 0 points  What month? 0 points 0 points  What time? 0 points 0 points  Count back from 20 0 points 0 points  Months in reverse 0 points 0 points  Repeat phrase 0 points 0 points  Total Score 0 0    Immunization History  Administered Date(s) Administered  . Influenza, High Dose Seasonal PF 10/02/2017  . Influenza,inj,Quad PF,6+ Mos 10/03/2012, 09/21/2014, 11/07/2015  . Influenza,inj,quad, With Preservative 11/12/2016  . Influenza-Unspecified 10/28/2013  . Pneumococcal Conjugate-13 01/04/2014, 01/04/2014, 11/04/2016  . Pneumococcal Polysaccharide-23 12/29/2012, 11/19/2017  . Zoster Recombinat (Shingrix) 11/25/2016, 01/26/2017    Qualifies for Shingles Vaccine? Up to date   Screening Tests Health Maintenance  Topic Date Due  . FOOT EXAM  12/28/2017  . MAMMOGRAM  04/14/2018  .  OPHTHALMOLOGY EXAM  04/29/2018  . HEMOGLOBIN A1C  06/18/2018  . INFLUENZA VACCINE  08/13/2018  .  COLONOSCOPY  02/17/2023  . TETANUS/TDAP  10/29/2023  . DEXA SCAN  Completed  . Hepatitis C Screening  Completed  . PNA vac Low Risk Adult  Completed   Cancer Screenings: Lung: Low Dose CT Chest recommended if Age 24-80 years, 30 pack-year currently smoking OR have quit w/in 15years. Patient does qualify. Breast:  Up to date on Mammogram? Yes   Up to date of Bone Density/Dexa? Yes Colorectal: Up to date   Additional Screenings: Hepatitis C Screening: completed.  Plan:  - Due for mammogram for breast cancer screening but would like to wait until COVID -19 restrictions are over then will notify provider's office.   I have personally reviewed and noted the following in the patient's chart:   . Medical and social history . Use of alcohol, tobacco or illicit drugs  . Current medications and supplements . Functional ability and status . Nutritional status . Physical activity . Advanced directives . List of other physicians . Hospitalizations, surgeries, and ER visits in previous 12 months . Vitals . Screenings to include cognitive, depression, and falls . Referrals and appointments  In addition, I have reviewed and discussed with patient certain preventive protocols, quality metrics, and best practice recommendations. A written personalized care plan for preventive services as well as general preventive health recommendations were provided to patient.   Sandrea Hughs, NP  06/07/2018

## 2018-06-23 ENCOUNTER — Other Ambulatory Visit: Payer: Self-pay | Admitting: Internal Medicine

## 2018-06-23 DIAGNOSIS — E785 Hyperlipidemia, unspecified: Secondary | ICD-10-CM

## 2018-06-23 DIAGNOSIS — E1169 Type 2 diabetes mellitus with other specified complication: Secondary | ICD-10-CM

## 2018-07-05 ENCOUNTER — Other Ambulatory Visit: Payer: Self-pay | Admitting: *Deleted

## 2018-07-05 DIAGNOSIS — Z20822 Contact with and (suspected) exposure to covid-19: Secondary | ICD-10-CM

## 2018-07-05 NOTE — Progress Notes (Signed)
[id number] 

## 2018-07-09 LAB — NOVEL CORONAVIRUS, NAA: SARS-CoV-2, NAA: NOT DETECTED

## 2018-07-27 ENCOUNTER — Other Ambulatory Visit: Payer: Self-pay | Admitting: Internal Medicine

## 2018-07-29 DIAGNOSIS — Z803 Family history of malignant neoplasm of breast: Secondary | ICD-10-CM | POA: Diagnosis not present

## 2018-07-29 DIAGNOSIS — Z1231 Encounter for screening mammogram for malignant neoplasm of breast: Secondary | ICD-10-CM | POA: Diagnosis not present

## 2018-07-29 LAB — HM MAMMOGRAPHY

## 2018-08-01 ENCOUNTER — Encounter: Payer: Self-pay | Admitting: *Deleted

## 2018-08-26 ENCOUNTER — Other Ambulatory Visit: Payer: Self-pay | Admitting: Internal Medicine

## 2018-08-26 DIAGNOSIS — E1142 Type 2 diabetes mellitus with diabetic polyneuropathy: Secondary | ICD-10-CM

## 2018-09-01 ENCOUNTER — Other Ambulatory Visit: Payer: Medicare Other

## 2018-09-01 ENCOUNTER — Other Ambulatory Visit: Payer: Self-pay

## 2018-09-01 DIAGNOSIS — E1149 Type 2 diabetes mellitus with other diabetic neurological complication: Secondary | ICD-10-CM | POA: Diagnosis not present

## 2018-09-01 DIAGNOSIS — E785 Hyperlipidemia, unspecified: Secondary | ICD-10-CM

## 2018-09-01 DIAGNOSIS — E1169 Type 2 diabetes mellitus with other specified complication: Secondary | ICD-10-CM | POA: Diagnosis not present

## 2018-09-02 LAB — COMPLETE METABOLIC PANEL WITH GFR
AG Ratio: 1.4 (calc) (ref 1.0–2.5)
ALT: 26 U/L (ref 6–29)
AST: 24 U/L (ref 10–35)
Albumin: 4.2 g/dL (ref 3.6–5.1)
Alkaline phosphatase (APISO): 139 U/L (ref 37–153)
BUN: 12 mg/dL (ref 7–25)
CO2: 27 mmol/L (ref 20–32)
Calcium: 9.8 mg/dL (ref 8.6–10.4)
Chloride: 107 mmol/L (ref 98–110)
Creat: 0.82 mg/dL (ref 0.60–0.93)
GFR, Est African American: 82 mL/min/{1.73_m2} (ref >=60)
GFR, Est Non African American: 71 mL/min/{1.73_m2} (ref >=60)
Globulin: 3.1 g/dL (calc) (ref 1.9–3.7)
Glucose, Bld: 120 mg/dL — ABNORMAL HIGH (ref 65–99)
Potassium: 4.5 mmol/L (ref 3.5–5.3)
Sodium: 139 mmol/L (ref 135–146)
Total Bilirubin: 0.5 mg/dL (ref 0.2–1.2)
Total Protein: 7.3 g/dL (ref 6.1–8.1)

## 2018-09-02 LAB — CBC WITH DIFFERENTIAL/PLATELET
Absolute Monocytes: 737 cells/uL (ref 200–950)
Basophils Absolute: 39 cells/uL (ref 0–200)
Basophils Relative: 0.4 %
Eosinophils Absolute: 165 cells/uL (ref 15–500)
Eosinophils Relative: 1.7 %
HCT: 38.1 % (ref 35.0–45.0)
Hemoglobin: 12.3 g/dL (ref 11.7–15.5)
Lymphs Abs: 2309 cells/uL (ref 850–3900)
MCH: 28.5 pg (ref 27.0–33.0)
MCHC: 32.3 g/dL (ref 32.0–36.0)
MCV: 88.2 fL (ref 80.0–100.0)
MPV: 10.2 fL (ref 7.5–12.5)
Monocytes Relative: 7.6 %
Neutro Abs: 6451 cells/uL (ref 1500–7800)
Neutrophils Relative %: 66.5 %
Platelets: 378 10*3/uL (ref 140–400)
RBC: 4.32 10*6/uL (ref 3.80–5.10)
RDW: 13 % (ref 11.0–15.0)
Total Lymphocyte: 23.8 %
WBC: 9.7 10*3/uL (ref 3.8–10.8)

## 2018-09-02 LAB — LIPID PANEL
Cholesterol: 110 mg/dL (ref <200)
HDL: 35 mg/dL — ABNORMAL LOW (ref >=50)
LDL Cholesterol (Calc): 58 mg/dL (calc)
Non-HDL Cholesterol (Calc): 75 mg/dL (calc) (ref <130)
Total CHOL/HDL Ratio: 3.1 (calc) (ref <5.0)
Triglycerides: 87 mg/dL (ref <150)

## 2018-09-02 LAB — HEMOGLOBIN A1C
Hgb A1c MFr Bld: 6.1 % of total Hgb — ABNORMAL HIGH (ref <5.7)
Mean Plasma Glucose: 128 (calc)
eAG (mmol/L): 7.1 (calc)

## 2018-09-05 ENCOUNTER — Ambulatory Visit (INDEPENDENT_AMBULATORY_CARE_PROVIDER_SITE_OTHER): Payer: Medicare Other | Admitting: Internal Medicine

## 2018-09-05 ENCOUNTER — Encounter: Payer: Self-pay | Admitting: Internal Medicine

## 2018-09-05 ENCOUNTER — Other Ambulatory Visit: Payer: Self-pay

## 2018-09-05 VITALS — BP 130/80 | HR 90 | Temp 98.6°F | Ht 65.0 in | Wt 216.0 lb

## 2018-09-05 DIAGNOSIS — F17210 Nicotine dependence, cigarettes, uncomplicated: Secondary | ICD-10-CM | POA: Diagnosis not present

## 2018-09-05 DIAGNOSIS — Z720 Tobacco use: Secondary | ICD-10-CM

## 2018-09-05 DIAGNOSIS — M25562 Pain in left knee: Secondary | ICD-10-CM

## 2018-09-05 DIAGNOSIS — Z23 Encounter for immunization: Secondary | ICD-10-CM | POA: Diagnosis not present

## 2018-09-05 DIAGNOSIS — E1149 Type 2 diabetes mellitus with other diabetic neurological complication: Secondary | ICD-10-CM | POA: Diagnosis not present

## 2018-09-05 DIAGNOSIS — E785 Hyperlipidemia, unspecified: Secondary | ICD-10-CM

## 2018-09-05 DIAGNOSIS — Z6835 Body mass index (BMI) 35.0-35.9, adult: Secondary | ICD-10-CM | POA: Diagnosis not present

## 2018-09-05 DIAGNOSIS — M1812 Unilateral primary osteoarthritis of first carpometacarpal joint, left hand: Secondary | ICD-10-CM | POA: Diagnosis not present

## 2018-09-05 DIAGNOSIS — Z716 Tobacco abuse counseling: Secondary | ICD-10-CM | POA: Diagnosis not present

## 2018-09-05 DIAGNOSIS — M25561 Pain in right knee: Secondary | ICD-10-CM

## 2018-09-05 DIAGNOSIS — G8929 Other chronic pain: Secondary | ICD-10-CM

## 2018-09-05 DIAGNOSIS — F17209 Nicotine dependence, unspecified, with unspecified nicotine-induced disorders: Secondary | ICD-10-CM

## 2018-09-05 DIAGNOSIS — E1169 Type 2 diabetes mellitus with other specified complication: Secondary | ICD-10-CM

## 2018-09-05 NOTE — Progress Notes (Signed)
Location:  [facility]   Advanced Directives 06/07/2018  Does Patient Have a Medical Advance Directive? No  Would patient like information on creating a medical advance directive? No - Patient declined  Pre-existing out of facility DNR order (yellow form or pink MOST form) -     Chief Complaint  Patient presents with  . Medical Management of Chronic Issues    43mth follow-up    HPI: Patient is a 73 y.o. female seen today for medical management of chronic diseases.    She continues to smoke cigarettes. She averages 10-12 cigarettes daily, but admits it varies. Does not express the desire to quit. Denies any trouble breathing, cough or wheezing.   She is requesting her flu vaccination today.  She is taking her metformin daily as prescribed. Follows a diet low in sugar and carbs. She does not report any incidents of hypoglycemia. Has lost 4 pounds since last visit. She states her peripheral neuropathy has improved at night.   She continues to suffer from bilateral knee pain. Her knees are swollen a few times a week. When the pain is greater than 5/10 on pain scale she will take extra strength tylenol for pain. She does not use a assistive device to ambulate. No reports of recent falls or injuries.   Her left and right wrist pain has not subsided. Her left CMC joint is more painful and swollen than the right. She is taking extra strength tylenol when the pain is greater than a 5/10 on pain scale. She will also rest her arm when there is breakthrough pain. She does not know of any triggers for pain.   She has not seen a dentist in over 2 years. She wears upper dentures. Her lower teeth are starting to become painful. She plans on making an appointment for a dental exam when she can afford one.   She has about 4-5 loose stools daily. She has a history of colectomy in the past. Her frequent stools have her taking pepto bismol several times a week before she leaves her home. She is worried  about her prolonged use of pepto bismol and wondering if she should use another medication.      Past Medical History:  Diagnosis Date  . Anxiety   . Benign essential hypertension   . Blood transfusion without reported diagnosis   . Diabetes mellitus without complication ([place])   . Diverticulitis of colon (without mention of hemorrhage)(562.11)   . Goiter, specified as simple   . Hyperlipidemia LDL goal < 100   . Numbness    TOES RT FOOT and toes left foot  . Obesity, unspecified   . Osteoarthrosis, unspecified whether generalized or localized, lower leg   . Osteopenia   . Other specified disease of nail    FUNGUS OF FINGERNAILS  . Pernicious anemia   . Unspecified glaucoma(365.9)   . Unspecified vitamin D deficiency     Past Surgical History:  Procedure Laterality Date  . BACK SURGERY    . CATARACT EXTRACTION     right eye 06/20/15 left eye 09/12/15  . COLECTOMY  2009  . JOINT REPLACEMENT     right  . LUMBAR LAMINECTOMY/DECOMPRESSION MICRODISCECTOMY N/A 03/30/2013   Procedure: CENTRAL DECOMPRESSION L4 - L5 AND EXCISION 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;  Surgeon: Tobi Bastos, MD;  Location: [facility];  Service: Orthopedics;  Laterality: N/A;  . TOTAL KNEE ARTHROPLASTY Right 02/01/2013   Procedure: RIGHT TOTAL KNEE ARTHROPLASTY;  Surgeon: Jori Moll  Fransico Setters, MD;  Location: [facility];  Service: Orthopedics;  Laterality: Right;  . TOTAL KNEE ARTHROPLASTY Left 04/18/2014   Procedure: LEFT TOTAL KNEE ARTHROPLASTY;  Surgeon: Latanya Maudlin, MD;  Location: [facility];  Service: Orthopedics;  Laterality: Left;    Allergies  Allergen Reactions  . Aspirin Nausea Only    Can take 81 mg but can't take 325mg , hard on stomach.    Outpatient Encounter Medications as of 09/05/2018  Medication Sig  . Acetaminophen (TYLENOL EXTRA STRENGTH PO) Take 2 tablets by mouth 2 (two) times daily as needed.  Marland Kitchen aspirin EC 81 MG tablet Take 81 mg by mouth daily.  Marland Kitchen atorvastatin (LIPITOR) 40 MG tablet TAKE 1 TABLET  BY MOUTH ONCE DAILY AT  6PM  . Calcium Carbonate-Vitamin D (OYSTER SHELL CALCIUM/D) 250-125 MG-UNIT TABS Take 1 tablet by mouth.  . carboxymethylcellulose (REFRESH PLUS) 0.5 % SOLN Place 1 drop into both eyes 3 (three) times daily as needed (dry eyes).   . Cholecalciferol (VITAMIN D-3) 1000 UNITS CAPS Take by mouth daily.  . COMBIGAN 0.2-0.5 % ophthalmic solution INT 1 GTT INTO OU BID  . latanoprost (XALATAN) 0.005 % ophthalmic solution Place 1 drop into both eyes at bedtime.  Marland Kitchen lisinopril (ZESTRIL) 5 MG tablet Take 1 tablet by mouth once daily  . metFORMIN (GLUCOPHAGE) 500 MG tablet TAKE 1/2 (ONE-HALF) TABLET BY MOUTH IN THE EVENING WITH SUPPER  . Multiple Vitamin (MULTIVITAMIN) tablet Take 1 tablet by mouth daily.  Marland Kitchen omega-3 acid ethyl esters (LOVAZA) 1 g capsule Take 1 g by mouth daily.   No facility-administered encounter medications on file as of 09/05/2018.     Review of Systems:  Review of Systems  Constitutional: Negative for activity change, appetite change, fatigue and fever.  HENT: Positive for dental problem. Negative for hearing loss and trouble swallowing.   Eyes: Negative for photophobia and visual disturbance.  Respiratory: Negative for cough, shortness of breath and wheezing.   Cardiovascular: Negative for chest pain, palpitations and leg swelling.  Gastrointestinal: Negative for constipation, diarrhea and nausea.  Endocrine: Negative for polydipsia, polyphagia and polyuria.  Genitourinary: Negative for dysuria, frequency and hematuria.       Nocturia  Musculoskeletal: Positive for arthralgias, joint swelling and myalgias.       Bilateral knee pain. Bilateral wrist and thumb pain, swelling.   Skin:       Onychomycosis of nail beds, and toenails  Neurological: Positive for numbness. Negative for weakness and headaches.  Psychiatric/Behavioral: Negative for dysphoric mood and sleep disturbance. The patient is not nervous/anxious.     Health Maintenance  Topic Date Due   . FOOT EXAM  12/28/2017  . OPHTHALMOLOGY EXAM  04/29/2018  . INFLUENZA VACCINE  08/13/2018  . HEMOGLOBIN A1C  03/04/2019  . MAMMOGRAM  07/28/2020  . COLONOSCOPY  02/17/2023  . TETANUS/TDAP  10/29/2023  . DEXA SCAN  Completed  . Hepatitis C Screening  Completed  . PNA vac Low Risk Adult  Completed    Physical Exam: Vitals:   09/05/18 0909  BP: 130/80  Pulse: 90  Temp: 98.6 F (37 C)  TempSrc: Oral  SpO2: 97%  Weight: 216 lb (98 kg)  Height: 5\' 5"  (1.651 m)   Body mass index is 35.94 kg/m. Physical Exam Vitals signs reviewed.  Constitutional:      Appearance: Normal appearance.  HENT:     Mouth/Throat:     Mouth: Mucous membranes are moist.  Cardiovascular:     Rate and Rhythm: Normal  rate and regular rhythm.     Pulses: Normal pulses.     Heart sounds: Normal heart sounds. No murmur.  Pulmonary:     Effort: Pulmonary effort is normal. No respiratory distress.     Breath sounds: Normal breath sounds. No wheezing.  Abdominal:     General: Abdomen is flat. Bowel sounds are normal.     Palpations: Abdomen is soft.  Musculoskeletal:     Right wrist: She exhibits tenderness, bony tenderness and swelling.     Left wrist: She exhibits tenderness, bony tenderness and swelling. She exhibits normal range of motion.     Right knee: She exhibits effusion. She exhibits normal range of motion and no erythema.     Left knee: She exhibits effusion. She exhibits normal range of motion and no erythema.     Right lower leg: No edema.     Left lower leg: No edema.  Feet:     Right foot:     Toenail Condition: Right toenails are abnormally thick.     Left foot:     Toenail Condition: Left toenails are abnormally thick.  Skin:    General: Skin is warm and dry.     Capillary Refill: Capillary refill takes less than 2 seconds.     Nails: There is clubbing.     Comments: Thickness and brown discoloration to nails.   Neurological:     General: No focal deficit present.     Mental  Status: She is alert and oriented to person, place, and time.  Psychiatric:        Mood and Affect: Mood normal.        Behavior: Behavior normal.     Labs reviewed: Basic Metabolic Panel: Recent Labs    12/17/17 0807 09/01/18 0808  NA 139 139  K 4.5 4.5  CL 104 107  CO2 27 27  GLUCOSE 115* 120*  BUN 15 12  CREATININE 0.91 0.82  CALCIUM 9.8 9.8   Liver Function Tests: Recent Labs    09/01/18 0808  AST 24  ALT 26  BILITOT 0.5  PROT 7.3   No results for input(s): LIPASE, AMYLASE in the last 8760 hours. No results for input(s): AMMONIA in the last 8760 hours. CBC: Recent Labs    09/01/18 0808  WBC 9.7  NEUTROABS 6,451  HGB 12.3  HCT 38.1  MCV 88.2  PLT 378   Lipid Panel: Recent Labs    12/17/17 0807 09/01/18 0808  CHOL 118 110  HDL 36* 35*  LDLCALC 63 58  TRIG 107 87  CHOLHDL 3.3 3.1   Lab Results  Component Value Date   HGBA1C 6.1 (H) 09/01/2018    Procedures since last visit: No results found.  Assessment/Plan 1. Type 2 diabetes mellitus with neurological complications (HCC) - symptoms have improved and reports less neuropathy - eye exam scheduled for next month - continue current medication regimen - continue to follow a diet low in sugar and carbohydrates - hemoglobin A1C- future - complete metabolic panel with GFR- future - microalbumin- future - diabetic foot exam- future  2. Tobacco use disorder, continuous - she does not report any respiratory symptoms - she has not cut back on her smoking - she has no plans to quit smoking at this time - she has been educated about the dangers of prolonged tobacco use -  Referral for CT chest Lung cancer screen low dose w/o contrast - Referral for bone density test - administer high dose flu  vaccination today  3. Tobacco abuse counseling - same as above  4. Class 2 severe obesity due to excess calories with serious comorbidity and body mass index (BMI) of 35.0 to 35.9 in adult [facility]) - she  has lost 4 lbs since last visit  - encourage counting calories or weight watchers program - continue to limit sugar and carbs from diet - may consider weight and wellness clinic if weight loss do not continue  5. Hyperlipidemia associated with  Type 2 diabetes mellitus (HCC) - Total cholesterol at goal - HDL remains low- suspect smoking as cause - continue atorvastatin 40 mg daily  - lipid panel- future - complete blood count with differential/platelets- future  6. Chronic pain of both knees - bilateral knees with effusion, has had bilateral knee arthroplasty in past - no reports of recent falls or injury - continue extra strength tylenol for pain - RICE   7. Tobacco use - same as above  8. Nicotine dependence, cigarettes, uncomplicated - same as above  9. Arthritis of carpometacarpal (CMC) joint of left thumb - swelling and tenderness to left CMC joint - referral to Dr. Caralyn Guile to be made - continue extra strength tylenol for pain  10. Need for influenza vaccination - administer FluQuad (high dose 65+) vaccine  11. Onychomycosis of toenails - discoloration and thickness to toenails and fingernails - has tried lotrimin in the past without success - consider podiatry referral for toenails and dermatology referral for fingernails if it continues to bother patient   Labs/tests ordered:  CT chest lung cancer screen low dose w/o contrast, bone density test, complete blood count with differential/platelets, complete metabolic panel with GFR, hemoglobin A1C, lipid panel, microalbumin, diabetic foot exam- future Next appt:  Follow up in 4 months

## 2018-09-05 NOTE — Patient Instructions (Signed)
[facility] to get your bone density test done.

## 2018-09-06 ENCOUNTER — Telehealth: Payer: Self-pay | Admitting: Internal Medicine

## 2018-09-06 ENCOUNTER — Other Ambulatory Visit: Payer: Self-pay | Admitting: Internal Medicine

## 2018-09-06 DIAGNOSIS — Z780 Asymptomatic menopausal state: Secondary | ICD-10-CM

## 2018-09-06 DIAGNOSIS — E2839 Other primary ovarian failure: Secondary | ICD-10-CM

## 2018-09-06 NOTE — Telephone Encounter (Signed)
Pt called to inform us that she had set her bone density appt up with [facility] for thurs 09/08/18.   [facility] told her they didn't have an order. I didn't see an updated order for 2020 in Epic.  Thanks, Vilinda Blanks.

## 2018-09-06 NOTE — Telephone Encounter (Signed)
There was an order in epic for a bone density.  Perhaps it was placed for a different location at the wellness visit.

## 2018-09-08 DIAGNOSIS — E119 Type 2 diabetes mellitus without complications: Secondary | ICD-10-CM | POA: Diagnosis not present

## 2018-09-08 DIAGNOSIS — Z96653 Presence of artificial knee joint, bilateral: Secondary | ICD-10-CM | POA: Diagnosis not present

## 2018-09-08 DIAGNOSIS — E2839 Other primary ovarian failure: Secondary | ICD-10-CM | POA: Diagnosis not present

## 2018-09-08 DIAGNOSIS — Z8262 Family history of osteoporosis: Secondary | ICD-10-CM | POA: Diagnosis not present

## 2018-09-08 DIAGNOSIS — Z780 Asymptomatic menopausal state: Secondary | ICD-10-CM | POA: Diagnosis not present

## 2018-09-08 LAB — HM DEXA SCAN: HM Dexa Scan: NORMAL

## 2018-09-09 ENCOUNTER — Encounter: Payer: Self-pay | Admitting: *Deleted

## 2018-09-12 ENCOUNTER — Telehealth: Payer: Self-pay | Admitting: *Deleted

## 2018-09-12 DIAGNOSIS — M79645 Pain in left finger(s): Secondary | ICD-10-CM | POA: Diagnosis not present

## 2018-09-12 DIAGNOSIS — Z471 Aftercare following joint replacement surgery: Secondary | ICD-10-CM | POA: Diagnosis not present

## 2018-09-12 DIAGNOSIS — Z96653 Presence of artificial knee joint, bilateral: Secondary | ICD-10-CM | POA: Diagnosis not present

## 2018-09-12 NOTE — Telephone Encounter (Signed)
Left message on VM with results, advised to call if any questions.  Per Dr. Mariea Clonts " Bone density is normal" results sent to scanning

## 2018-09-16 DIAGNOSIS — E119 Type 2 diabetes mellitus without complications: Secondary | ICD-10-CM | POA: Diagnosis not present

## 2018-09-16 DIAGNOSIS — H26493 Other secondary cataract, bilateral: Secondary | ICD-10-CM | POA: Diagnosis not present

## 2018-09-16 DIAGNOSIS — H401131 Primary open-angle glaucoma, bilateral, mild stage: Secondary | ICD-10-CM | POA: Diagnosis not present

## 2018-09-16 DIAGNOSIS — Z961 Presence of intraocular lens: Secondary | ICD-10-CM | POA: Diagnosis not present

## 2018-09-20 ENCOUNTER — Ambulatory Visit
Admission: RE | Admit: 2018-09-20 | Discharge: 2018-09-20 | Disposition: A | Discharge status: Home / Self Care | Payer: Medicare Other | Source: Ambulatory Visit | Attending: Internal Medicine | Admitting: Internal Medicine

## 2018-09-20 ENCOUNTER — Other Ambulatory Visit: Payer: Self-pay

## 2018-09-20 DIAGNOSIS — F17210 Nicotine dependence, cigarettes, uncomplicated: Secondary | ICD-10-CM

## 2018-09-20 DIAGNOSIS — Z720 Tobacco use: Secondary | ICD-10-CM

## 2018-09-20 DIAGNOSIS — F17209 Nicotine dependence, unspecified, with unspecified nicotine-induced disorders: Secondary | ICD-10-CM

## 2018-11-27 ENCOUNTER — Other Ambulatory Visit: Payer: Self-pay | Admitting: Internal Medicine

## 2018-11-27 DIAGNOSIS — E1142 Type 2 diabetes mellitus with diabetic polyneuropathy: Secondary | ICD-10-CM

## 2019-01-09 ENCOUNTER — Other Ambulatory Visit: Payer: Self-pay

## 2019-01-09 ENCOUNTER — Other Ambulatory Visit: Payer: Medicare Other

## 2019-01-09 DIAGNOSIS — E1169 Type 2 diabetes mellitus with other specified complication: Secondary | ICD-10-CM

## 2019-01-09 DIAGNOSIS — E1149 Type 2 diabetes mellitus with other diabetic neurological complication: Secondary | ICD-10-CM

## 2019-01-10 LAB — COMPLETE METABOLIC PANEL WITH GFR
AG Ratio: 1.1 (calc) (ref 1.0–2.5)
ALT: 76 U/L — ABNORMAL HIGH (ref 6–29)
AST: 65 U/L — ABNORMAL HIGH (ref 10–35)
Albumin: 4.1 g/dL (ref 3.6–5.1)
Alkaline phosphatase (APISO): 226 U/L — ABNORMAL HIGH (ref 37–153)
BUN: 13 mg/dL (ref 7–25)
CO2: 26 mmol/L (ref 20–32)
Calcium: 9.6 mg/dL (ref 8.6–10.4)
Chloride: 104 mmol/L (ref 98–110)
Creat: 0.9 mg/dL (ref 0.60–0.93)
GFR, Est African American: 73 mL/min/{1.73_m2} (ref >=60)
GFR, Est Non African American: 63 mL/min/{1.73_m2} (ref >=60)
Globulin: 3.6 g/dL (calc) (ref 1.9–3.7)
Glucose, Bld: 113 mg/dL — ABNORMAL HIGH (ref 65–99)
Potassium: 4.3 mmol/L (ref 3.5–5.3)
Sodium: 138 mmol/L (ref 135–146)
Total Bilirubin: 0.5 mg/dL (ref 0.2–1.2)
Total Protein: 7.7 g/dL (ref 6.1–8.1)

## 2019-01-10 LAB — CBC WITH DIFFERENTIAL/PLATELET
Absolute Monocytes: 770 cells/uL (ref 200–950)
Basophils Absolute: 60 cells/uL (ref 0–200)
Basophils Relative: 0.6 %
Eosinophils Absolute: 180 cells/uL (ref 15–500)
Eosinophils Relative: 1.8 %
HCT: 38.3 % (ref 35.0–45.0)
Hemoglobin: 12.3 g/dL (ref 11.7–15.5)
Lymphs Abs: 2000 cells/uL (ref 850–3900)
MCH: 28.4 pg (ref 27.0–33.0)
MCHC: 32.1 g/dL (ref 32.0–36.0)
MCV: 88.5 fL (ref 80.0–100.0)
MPV: 10.5 fL (ref 7.5–12.5)
Monocytes Relative: 7.7 %
Neutro Abs: 6990 cells/uL (ref 1500–7800)
Neutrophils Relative %: 69.9 %
Platelets: 398 10*3/uL (ref 140–400)
RBC: 4.33 10*6/uL (ref 3.80–5.10)
RDW: 12.5 % (ref 11.0–15.0)
Total Lymphocyte: 20 %
WBC: 10 10*3/uL (ref 3.8–10.8)

## 2019-01-10 LAB — LIPID PANEL
Cholesterol: 114 mg/dL (ref <200)
HDL: 38 mg/dL — ABNORMAL LOW (ref >=50)
LDL Cholesterol (Calc): 59 mg/dL (calc)
Non-HDL Cholesterol (Calc): 76 mg/dL (calc) (ref <130)
Total CHOL/HDL Ratio: 3 (calc) (ref <5.0)
Triglycerides: 83 mg/dL (ref <150)

## 2019-01-10 LAB — HEMOGLOBIN A1C
Hgb A1c MFr Bld: 6 % of total Hgb — ABNORMAL HIGH (ref <5.7)
Mean Plasma Glucose: 126 (calc)
eAG (mmol/L): 7 (calc)

## 2019-01-11 ENCOUNTER — Other Ambulatory Visit: Payer: Self-pay | Admitting: Internal Medicine

## 2019-01-11 DIAGNOSIS — R748 Abnormal levels of other serum enzymes: Secondary | ICD-10-CM

## 2019-01-12 ENCOUNTER — Ambulatory Visit: Payer: Medicare Other | Admitting: Internal Medicine

## 2019-01-16 ENCOUNTER — Ambulatory Visit: Payer: Medicare Other | Admitting: Internal Medicine

## 2019-01-16 DIAGNOSIS — Z961 Presence of intraocular lens: Secondary | ICD-10-CM | POA: Diagnosis not present

## 2019-01-16 DIAGNOSIS — E119 Type 2 diabetes mellitus without complications: Secondary | ICD-10-CM | POA: Diagnosis not present

## 2019-01-16 DIAGNOSIS — H401131 Primary open-angle glaucoma, bilateral, mild stage: Secondary | ICD-10-CM | POA: Diagnosis not present

## 2019-01-16 DIAGNOSIS — H26493 Other secondary cataract, bilateral: Secondary | ICD-10-CM | POA: Diagnosis not present

## 2019-01-17 ENCOUNTER — Other Ambulatory Visit: Payer: Medicare Other

## 2019-01-17 ENCOUNTER — Other Ambulatory Visit: Payer: Self-pay

## 2019-01-17 DIAGNOSIS — R748 Abnormal levels of other serum enzymes: Secondary | ICD-10-CM

## 2019-01-19 ENCOUNTER — Other Ambulatory Visit: Payer: Self-pay

## 2019-01-19 ENCOUNTER — Encounter: Payer: Self-pay | Admitting: Internal Medicine

## 2019-01-19 ENCOUNTER — Ambulatory Visit (INDEPENDENT_AMBULATORY_CARE_PROVIDER_SITE_OTHER): Payer: Medicare Other | Admitting: Internal Medicine

## 2019-01-19 VITALS — BP 130/80 | HR 81 | Temp 97.2°F | Ht 65.0 in | Wt 213.0 lb

## 2019-01-19 DIAGNOSIS — R7401 Elevation of levels of liver transaminase levels: Secondary | ICD-10-CM

## 2019-01-19 DIAGNOSIS — E785 Hyperlipidemia, unspecified: Secondary | ICD-10-CM

## 2019-01-19 DIAGNOSIS — R310 Gross hematuria: Secondary | ICD-10-CM

## 2019-01-19 DIAGNOSIS — F17209 Nicotine dependence, unspecified, with unspecified nicotine-induced disorders: Secondary | ICD-10-CM

## 2019-01-19 DIAGNOSIS — E1169 Type 2 diabetes mellitus with other specified complication: Secondary | ICD-10-CM

## 2019-01-19 DIAGNOSIS — E1149 Type 2 diabetes mellitus with other diabetic neurological complication: Secondary | ICD-10-CM

## 2019-01-19 DIAGNOSIS — Z716 Tobacco abuse counseling: Secondary | ICD-10-CM

## 2019-01-19 NOTE — Progress Notes (Signed)
Location:  [facility] clinic Provider:  Marteze Vecchio L. Mariea Clonts, D.O., C.M.D.  Code Status: full code Goals of Care:  Advanced Directives 01/19/2019  Does Patient Have a Medical Advance Directive? No  Would patient like information on creating a medical advance directive? -  Pre-existing out of facility DNR order (yellow form or pink MOST form) -  Educated again on importance of completing living will/hcpoa form  Chief Complaint  Patient presents with  . Medical Management of Chronic Issues    4 month follow up . Dicuss lab results , blood in urine, foot exam     HPI: Patient is a 74 y.o. female seen today for medical management of chronic diseases.    She has not had RUQ pain.  No BM changes.  She is taking gas pills b/c she says if she breaks wind it is stinky.  There have been no changes in her BMs--goes regularly and no blood or dark stools.   She did see blood in her urine this week.  She was able to see discoloration of her urine--it was pink.  She's seen it at night only.  She did see some this am, but it does not appear on the pad.  No dysuria, does have frequency.  She is drinking a lot of water.  There is also increased urgency.    She thinks her heart is fluttering.  Happened a couple of weeks ago the last time.  It's also happened other times.  She says she doesn't know if it goes with her head, heart or toes.  She feels different in the head--unsteady when she stands up and her neuropathy is more noticeable.  She feels it at night.  She puts her feet on a pillow which helps a little.  Not vertigo.  It is lightheaded.    BP was good today.  She's not aware of any low blood sugar.  hba1c was 6 vs 6.1 in august.   Only takes two tylenol in am and two in pm and skipped it this week.  She'd been taking the meloxicam once a day as prescribed.   Cholesterol is at goal with lipitor--she has been taking it faithfully.   Eye appt is scheduled for 01/16/19--Dr. Midge Aver.    Advance directive  form given again for her to complete, get notarized and bring [place] a copy.  Past Medical History:  Diagnosis Date  . Anxiety   . Benign essential hypertension   . Blood transfusion without reported diagnosis   . Diabetes mellitus without complication ([place])   . Diverticulitis of colon (without mention of hemorrhage)(562.11)   . Goiter, specified as simple   . Hyperlipidemia LDL goal < 100   . Numbness    TOES RT FOOT and toes left foot  . Obesity, unspecified   . Osteoarthrosis, unspecified whether generalized or localized, lower leg   . Osteopenia   . Other specified disease of nail    FUNGUS OF FINGERNAILS  . Pernicious anemia   . Unspecified glaucoma(365.9)   . Unspecified vitamin D deficiency     Past Surgical History:  Procedure Laterality Date  . BACK SURGERY    . CATARACT EXTRACTION     right eye 06/20/15 left eye 09/12/15  . COLECTOMY  2009  . JOINT REPLACEMENT     right  . LUMBAR LAMINECTOMY/DECOMPRESSION MICRODISCECTOMY N/A 03/30/2013   Procedure: CENTRAL DECOMPRESSION L4 - L5 AND EXCISION 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;  Surgeon: Tobi Bastos, MD;  Location: [facility];  Service: Orthopedics;  Laterality: N/A;  . TOTAL KNEE ARTHROPLASTY Right 02/01/2013   Procedure: RIGHT TOTAL KNEE ARTHROPLASTY;  Surgeon: Tobi Bastos, MD;  Location: [facility];  Service: Orthopedics;  Laterality: Right;  . TOTAL KNEE ARTHROPLASTY Left 04/18/2014   Procedure: LEFT TOTAL KNEE ARTHROPLASTY;  Surgeon: Latanya Maudlin, MD;  Location: [facility];  Service: Orthopedics;  Laterality: Left;    Allergies  Allergen Reactions  . Aspirin Nausea Only    Can take 81 mg but can't take 325mg , hard on stomach.    Outpatient Encounter Medications as of 01/19/2019  Medication Sig  . Acetaminophen (TYLENOL EXTRA STRENGTH PO) Take 2 tablets by mouth 2 (two) times daily as needed.  Marland Kitchen aspirin EC 81 MG tablet Take 81 mg by mouth daily.  Marland Kitchen atorvastatin (LIPITOR) 40 MG tablet TAKE 1 TABLET BY MOUTH ONCE DAILY AT  6PM   . Calcium Carbonate-Vitamin D (OYSTER SHELL CALCIUM/D) 250-125 MG-UNIT TABS Take 1 tablet by mouth.  . carboxymethylcellulose (REFRESH PLUS) 0.5 % SOLN Place 1 drop into both eyes 3 (three) times daily as needed (dry eyes).   . Cholecalciferol (VITAMIN D-3) 1000 UNITS CAPS Take by mouth daily.  . COMBIGAN 0.2-0.5 % ophthalmic solution INT 1 GTT INTO OU BID  . latanoprost (XALATAN) 0.005 % ophthalmic solution Place 1 drop into both eyes at bedtime.  Marland Kitchen lisinopril (ZESTRIL) 5 MG tablet Take 1 tablet by mouth once daily  . meloxicam (MOBIC) 15 MG tablet meloxicam 15 mg tablet  Take 1 tablet every day by oral route for 30 days.  . metFORMIN (GLUCOPHAGE) 500 MG tablet TAKE 1/2 (ONE-HALF) TABLET BY MOUTH IN THE EVENING WITH SUPPER  . Multiple Vitamin (MULTIVITAMIN) tablet Take 1 tablet by mouth daily.  Marland Kitchen omega-3 acid ethyl esters (LOVAZA) 1 g capsule Take 1 g by mouth daily.   No facility-administered encounter medications on file as of 01/19/2019.    Review of Systems:  Review of Systems  Constitutional: Negative for chills, fever and malaise/fatigue.  HENT: Negative for congestion.   Eyes: Negative for blurred vision.  Respiratory: Negative for cough and shortness of breath.   Cardiovascular: Negative for chest pain, palpitations and leg swelling.  Gastrointestinal: Negative for abdominal pain, blood in stool, constipation, diarrhea and melena.  Genitourinary: Positive for frequency, hematuria and urgency. Negative for dysuria and flank pain.  Musculoskeletal: Positive for back pain. Negative for falls.  Skin: Negative for itching and rash.  Neurological: Positive for dizziness, tingling and sensory change. Negative for loss of consciousness.  Endo/Heme/Allergies: Does not bruise/bleed easily.  Psychiatric/Behavioral: Negative for depression. The patient is not nervous/anxious and does not have insomnia.        Memory does not seem as good today and she's very jittery    Health Maintenance   Topic Date Due  . FOOT EXAM  12/28/2017  . OPHTHALMOLOGY EXAM  04/29/2018  . HEMOGLOBIN A1C  07/10/2019  . MAMMOGRAM  07/28/2020  . COLONOSCOPY  02/17/2023  . TETANUS/TDAP  10/29/2023  . INFLUENZA VACCINE  Completed  . DEXA SCAN  Completed  . Hepatitis C Screening  Completed  . PNA vac Low Risk Adult  Completed    Physical Exam: Vitals:   01/19/19 0926  BP: 130/80  Pulse: 81  Temp: (!) 97.2 F (36.2 C)  TempSrc: Temporal  SpO2: 91%  Weight: 213 lb (96.6 kg)  Height: 5\' 5"  (1.651 m)   Body mass index is 35.45 kg/m. Physical Exam Vitals reviewed.  Constitutional:      Appearance: Normal appearance.  HENT:     Head: Normocephalic and atraumatic.  Eyes:     Extraocular Movements: Extraocular movements intact.     Pupils: Pupils are equal, round, and reactive to light.  Cardiovascular:     Rate and Rhythm: Normal rate and regular rhythm.     Pulses: Normal pulses.     Heart sounds: Normal heart sounds.  Pulmonary:     Effort: Pulmonary effort is normal.     Breath sounds: Normal breath sounds. No wheezing, rhonchi or rales.  Abdominal:     General: Bowel sounds are normal. There is no distension.     Tenderness: There is no abdominal tenderness. There is no guarding or rebound.     Comments: No RUQ tenderness or suprapubic tenderness  Musculoskeletal:     Right lower leg: No edema.     Left lower leg: No edema.     Comments: Moving much slower than usual, very stiff upon standing and to get onto exam table  Skin:    General: Skin is warm and dry.     Capillary Refill: Capillary refill takes less than 2 seconds.  Neurological:     General: No focal deficit present.     Mental Status: She is alert and oriented to person, place, and time.     Motor: No weakness.  Psychiatric:     Comments: Jittery and jumping between topics     Labs reviewed: Basic Metabolic Panel: Recent Labs    09/01/18 0808 01/09/19 0814 01/17/19 0813  NA 139 138 136  K 4.5 4.3 4.4    CL 107 104 103  CO2 27 26 26   GLUCOSE 120* 113* 120*  BUN 12 13 14   CREATININE 0.82 0.90 0.93  CALCIUM 9.8 9.6 9.9   Liver Function Tests: Recent Labs    09/01/18 0808 01/09/19 0814 01/17/19 0813  AST 24 65* 63*  ALT 26 76* 79*  BILITOT 0.5 0.5 0.6  PROT 7.3 7.7 7.8   No results for input(s): LIPASE, AMYLASE in the last 8760 hours. No results for input(s): AMMONIA in the last 8760 hours. CBC: Recent Labs    09/01/18 0808 01/09/19 0814 01/17/19 0813  WBC 9.7 10.0 12.1*  NEUTROABS 6,451 6,990 8,095*  HGB 12.3 12.3 12.5  HCT 38.1 38.3 38.6  MCV 88.2 88.5 87.5  PLT 378 398 418*   Lipid Panel: Recent Labs    09/01/18 0808 01/09/19 0814  CHOL 110 114  HDL 35* 38*  LDLCALC 58 59  TRIG 87 83  CHOLHDL 3.1 3.0   Lab Results  Component Value Date   HGBA1C 6.0 (H) 01/09/2019     Assessment/Plan 1. Gross hematuria - r/o UTI as cause - seems to have increased urgency and frequency, as well as wbc count - Urinalysis with Reflex Microscopic - Urine culture  2. Transaminitis - persistent even with holding tylenol; is still on lipitor so advised to hold it until we get her RUQ [place] - US Abdomen Limited RUQ; Future  3. Type 2 diabetes mellitus with neurological complications ([place]) -doing well, good control, cont same regimen Lab Results  Component Value Date   HGBA1C 6.0 (H) 01/09/2019  -encouraged better diet and exercise  4. Hyperlipidemia associated with type 2 diabetes mellitus ([place]) -hopefully, lipitor is not cause of transaminitis--if it is, we'll probably need to get her on repatha or praluent b/c diet is poor and statin was required to get her to  goal -holding lipitor now due to transaminitis  5. Tobacco use disorder, continuous -ongoing smoker, not ready to quit despite counseling  6. Tobacco abuse counseling -Counseled on importance of quitting smoking for more than 3 mins  Labs/tests ordered:   Lab Orders     Urine culture     Urinalysis with  Reflex Microscopic RUQ [place]  Next appt:  Two weeks to f/u on liver; 02/03/2019  Jaysha Lasure L. Darvell Monteforte, D.O. [facility] [address],  [address] Cell Phone (Mon-Fri 8am-5pm):  [phone] On Call:  [phone] & follow prompts after 5pm & weekends Office Phone:  [phone] Office Fax:  [phone]

## 2019-01-19 NOTE — Patient Instructions (Signed)
Continue to hold your tylenol until I let you know you can restart. Hold your lipitor also right now until we get the ultrasound of your liver.   I may have you come back to recheck the liver labs depending on the ultrasound findings. Ok to restart meloxicam. Get up slowly. We'll be in touch with your urine results.

## 2019-01-20 LAB — CBC WITH DIFFERENTIAL/PLATELET
Absolute Monocytes: 956 cells/uL — ABNORMAL HIGH (ref 200–950)
Basophils Absolute: 85 cells/uL (ref 0–200)
Basophils Relative: 0.7 %
Eosinophils Absolute: 206 cells/uL (ref 15–500)
Eosinophils Relative: 1.7 %
HCT: 38.6 % (ref 35.0–45.0)
Hemoglobin: 12.5 g/dL (ref 11.7–15.5)
Lymphs Abs: 2759 cells/uL (ref 850–3900)
MCH: 28.3 pg (ref 27.0–33.0)
MCHC: 32.4 g/dL (ref 32.0–36.0)
MCV: 87.5 fL (ref 80.0–100.0)
MPV: 10.6 fL (ref 7.5–12.5)
Monocytes Relative: 7.9 %
Neutro Abs: 8095 cells/uL — ABNORMAL HIGH (ref 1500–7800)
Neutrophils Relative %: 66.9 %
Platelets: 418 10*3/uL — ABNORMAL HIGH (ref 140–400)
RBC: 4.41 10*6/uL (ref 3.80–5.10)
RDW: 12.6 % (ref 11.0–15.0)
Total Lymphocyte: 22.8 %
WBC: 12.1 10*3/uL — ABNORMAL HIGH (ref 3.8–10.8)

## 2019-01-20 LAB — BASIC METABOLIC PANEL
BUN: 14 mg/dL (ref 7–25)
CO2: 26 mmol/L (ref 20–32)
Calcium: 9.9 mg/dL (ref 8.6–10.4)
Chloride: 103 mmol/L (ref 98–110)
Creat: 0.93 mg/dL (ref 0.60–0.93)
Glucose, Bld: 120 mg/dL — ABNORMAL HIGH (ref 65–99)
Potassium: 4.4 mmol/L (ref 3.5–5.3)
Sodium: 136 mmol/L (ref 135–146)

## 2019-01-20 LAB — HEPATIC FUNCTION PANEL
AG Ratio: 1.2 (calc) (ref 1.0–2.5)
ALT: 79 U/L — ABNORMAL HIGH (ref 6–29)
AST: 63 U/L — ABNORMAL HIGH (ref 10–35)
Albumin: 4.2 g/dL (ref 3.6–5.1)
Alkaline phosphatase (APISO): 224 U/L — ABNORMAL HIGH (ref 37–153)
Bilirubin, Direct: 0.1 mg/dL (ref 0.0–0.2)
Globulin: 3.6 g/dL (calc) (ref 1.9–3.7)
Indirect Bilirubin: 0.5 mg/dL (calc) (ref 0.2–1.2)
Total Bilirubin: 0.6 mg/dL (ref 0.2–1.2)
Total Protein: 7.8 g/dL (ref 6.1–8.1)

## 2019-01-20 LAB — ACETAMINOPHEN LEVEL: Acetaminophen (Tylenol), Serum: <10 mg/L — ABNORMAL LOW (ref 10–20)

## 2019-01-20 NOTE — Progress Notes (Signed)
She appears to have a urinary tract infection.  I am waiting for the culture before sending in the antibiotic for it since she was not having bothersome symptoms.

## 2019-01-21 LAB — URINALYSIS, ROUTINE W REFLEX MICROSCOPIC
Bacteria, UA: NONE SEEN /HPF
Bilirubin Urine: NEGATIVE
Glucose, UA: NEGATIVE
Hyaline Cast: NONE SEEN /LPF
Ketones, ur: NEGATIVE
Nitrite: NEGATIVE
Specific Gravity, Urine: 1.016 (ref 1.001–1.03)
pH: 5.5 (ref 5.0–8.0)

## 2019-01-21 LAB — URINE CULTURE
MICRO NUMBER:: 10020756
SPECIMEN QUALITY:: ADEQUATE

## 2019-01-23 ENCOUNTER — Telehealth: Payer: Self-pay | Admitting: *Deleted

## 2019-01-23 MED ORDER — CEPHALEXIN 500 MG PO CAPS: 500.0000 mg | ORAL_CAPSULE | Freq: Two times a day (BID) | ORAL | 0 refills | Status: AC

## 2019-01-23 NOTE — Progress Notes (Signed)
Urine culture has returned growing out E coli.  I'd like her to start on keflex 500mg  po bid for 7 days for urinary tract infection.  She should eat yogurt or take an over the counter probiotic while on the antibiotic--example is align--take as directed on the bottle for 7 days.  She should be drinking plenty of water which she reported to me she was doing at her visit.  If her symptoms do not improve after completing the full course of antibiotics, she should call us back.

## 2019-01-23 NOTE — Telephone Encounter (Signed)
-----   Message from Gayland Curry, DO sent at 01/23/2019  5:32 AM EST ----- Urine culture has returned growing out E coli.  I'd like her to start on keflex 500mg  po bid for 7 days for urinary tract infection.  She should eat yogurt or take an over the counter probiotic while on the antibiotic--example is align--take as directed on the bottle for 7 days.  She should be drinking plenty of water which she reported to me she was doing at her visit.  If her symptoms do not improve after completing the full course of antibiotics, she should call us back.

## 2019-01-27 ENCOUNTER — Other Ambulatory Visit: Payer: Self-pay

## 2019-01-27 ENCOUNTER — Ambulatory Visit
Admission: RE | Admit: 2019-01-27 | Discharge: 2019-01-27 | Disposition: A | Discharge status: Home / Self Care | Payer: Medicare Other | Source: Ambulatory Visit | Attending: Internal Medicine | Admitting: Internal Medicine

## 2019-01-27 DIAGNOSIS — R7401 Elevation of levels of liver transaminase levels: Secondary | ICD-10-CM

## 2019-01-27 DIAGNOSIS — K8020 Calculus of gallbladder without cholecystitis without obstruction: Secondary | ICD-10-CM | POA: Diagnosis not present

## 2019-01-27 NOTE — Progress Notes (Signed)
She only has one gallstone so that does not explain her liver tests.  The liver has an abnormal appearance to it overall so an MRI of the liver is recommended as a next step to further evaluate.  If she is willing to get this, I will order it.

## 2019-01-27 NOTE — Progress Notes (Signed)
MRI order placed.

## 2019-02-03 ENCOUNTER — Telehealth: Payer: Self-pay | Admitting: Internal Medicine

## 2019-02-03 ENCOUNTER — Ambulatory Visit: Payer: Medicare Other | Admitting: Internal Medicine

## 2019-02-03 DIAGNOSIS — R7401 Elevation of levels of liver transaminase levels: Secondary | ICD-10-CM

## 2019-02-03 DIAGNOSIS — R932 Abnormal findings on diagnostic imaging of liver and biliary tract: Secondary | ICD-10-CM

## 2019-02-03 DIAGNOSIS — K769 Liver disease, unspecified: Secondary | ICD-10-CM

## 2019-02-03 NOTE — Telephone Encounter (Signed)
Pt called bc she remembers that Dr Mariea Clonts mentioned her getting a referral for MRI & no one has called her.  I didn't see an order in Epic. So pt thoght maybe Dr Mariea Clonts wanted to discuss the MRI before or after her appt on 02/10/19.  Please advise. Thanks, Lattie Haw

## 2019-02-04 DIAGNOSIS — R932 Abnormal findings on diagnostic imaging of liver and biliary tract: Secondary | ICD-10-CM | POA: Insufficient documentation

## 2019-02-04 DIAGNOSIS — R7401 Elevation of levels of liver transaminase levels: Secondary | ICD-10-CM | POA: Insufficient documentation

## 2019-02-04 NOTE — Telephone Encounter (Signed)
Strange b/c I have written on the [place] report where the MRI was ordered that I have ordered it.  I suspect I got interrupted.  I will place now.

## 2019-02-07 NOTE — Telephone Encounter (Signed)
Patient aware order placed and she will await call from imaging

## 2019-02-10 ENCOUNTER — Encounter: Payer: Self-pay | Admitting: Internal Medicine

## 2019-02-10 ENCOUNTER — Other Ambulatory Visit: Payer: Self-pay

## 2019-02-10 ENCOUNTER — Ambulatory Visit (INDEPENDENT_AMBULATORY_CARE_PROVIDER_SITE_OTHER): Payer: Medicare Other | Admitting: Internal Medicine

## 2019-02-10 VITALS — BP 138/82 | HR 80 | Temp 97.1°F | Ht 65.0 in | Wt 215.0 lb

## 2019-02-10 DIAGNOSIS — E1149 Type 2 diabetes mellitus with other diabetic neurological complication: Secondary | ICD-10-CM | POA: Diagnosis not present

## 2019-02-10 DIAGNOSIS — R7401 Elevation of levels of liver transaminase levels: Secondary | ICD-10-CM

## 2019-02-10 DIAGNOSIS — R932 Abnormal findings on diagnostic imaging of liver and biliary tract: Secondary | ICD-10-CM

## 2019-02-10 DIAGNOSIS — R310 Gross hematuria: Secondary | ICD-10-CM | POA: Diagnosis not present

## 2019-02-10 DIAGNOSIS — R2689 Other abnormalities of gait and mobility: Secondary | ICD-10-CM

## 2019-02-10 LAB — URINALYSIS, ROUTINE W REFLEX MICROSCOPIC
Bacteria, UA: NONE SEEN /HPF
Bilirubin Urine: NEGATIVE
Glucose, UA: NEGATIVE
Hyaline Cast: NONE SEEN /LPF
Ketones, ur: NEGATIVE
Leukocytes,Ua: NEGATIVE
Nitrite: NEGATIVE
Protein, ur: NEGATIVE
Specific Gravity, Urine: 1.011 (ref 1.001–1.03)
Squamous Epithelial / HPF: NONE SEEN /HPF (ref <=5)
WBC, UA: NONE SEEN /HPF (ref 0–5)
pH: 5.5 (ref 5.0–8.0)

## 2019-02-10 LAB — HEPATIC FUNCTION PANEL
AG Ratio: 1.3 (calc) (ref 1.0–2.5)
ALT: 28 U/L (ref 6–29)
AST: 26 U/L (ref 10–35)
Albumin: 4.3 g/dL (ref 3.6–5.1)
Alkaline phosphatase (APISO): 109 U/L (ref 37–153)
Bilirubin, Direct: 0.1 mg/dL (ref 0.0–0.2)
Globulin: 3.3 g/dL (calc) (ref 1.9–3.7)
Indirect Bilirubin: 0.3 mg/dL (calc) (ref 0.2–1.2)
Total Bilirubin: 0.4 mg/dL (ref 0.2–1.2)
Total Protein: 7.6 g/dL (ref 6.1–8.1)

## 2019-02-10 MED ORDER — GABAPENTIN 300 MG PO CAPS: 300.0000 mg | ORAL_CAPSULE | Freq: Every day | ORAL | 3 refills | Status: DC

## 2019-02-10 NOTE — Progress Notes (Signed)
Good news that her liver tests returned to normal.  Now the question becomes whether the elevation was from the statin or the tylenol.  She may resume just tylenol but should not take more than 3000mg  in a day total.   We'll recheck her lab again when I see her next and see if the tylenol affects her numbers. She should still stay off her cholesterol pill for now pending the MRI.

## 2019-02-10 NOTE — Progress Notes (Signed)
Location:  [facility] clinic Provider:  Aaleigha Bozza L. Mariea Clonts, D.O., C.M.D.  Goals of Care:  Advanced Directives 02/10/2019  Does Patient Have a Medical Advance Directive? No  Would patient like information on creating a medical advance directive? No - Patient declined  Pre-existing out of facility DNR order (yellow form or pink MOST form) -  still has not done  Chief Complaint  Patient presents with  . Medical Management of Chronic Issues    2 week follow up on Liver / blood in urine ? last seen on Sunday    HPI: Patient is a 74 y.o. female seen today for medical management of chronic diseases.    Had UTI for the first time when here last.  She still sees blood in her urine sometimes--just saw again the other day--Sunday it appeared pink.  Now the urine is usually clear since the infection was treated.  No other irritation down below to cause bleeding.  She used to have small fibroids--she never had bleeding from them--had been nickel-sized.  She occasionally has some urgency, but does drink a lot of water.    She was getting dark stool when taking pepto bismol to prevent loose stool.  She stopped it and the darkness is better.   Probiotic does seem to help her bowels.  She's not going immediately after meals now.    Talks about numbness in her feet and being unsteady as a result.  Not using her cane.  The feeling in her feet bothers her most at night.  She's willing to try gabapentin again and some PT for her balance.  Past Medical History:  Diagnosis Date  . Anxiety   . Benign essential hypertension   . Blood transfusion without reported diagnosis   . Diabetes mellitus without complication ([place])   . Diverticulitis of colon (without mention of hemorrhage)(562.11)   . Goiter, specified as simple   . Hyperlipidemia LDL goal < 100   . Numbness    TOES RT FOOT and toes left foot  . Obesity, unspecified   . Osteoarthrosis, unspecified whether generalized or localized, lower leg   .  Osteopenia   . Other specified disease of nail    FUNGUS OF FINGERNAILS  . Pernicious anemia   . Unspecified glaucoma(365.9)   . Unspecified vitamin D deficiency     Past Surgical History:  Procedure Laterality Date  . BACK SURGERY    . CATARACT EXTRACTION     right eye 06/20/15 left eye 09/12/15  . COLECTOMY  2009  . JOINT REPLACEMENT     right  . LUMBAR LAMINECTOMY/DECOMPRESSION MICRODISCECTOMY N/A 03/30/2013   Procedure: CENTRAL DECOMPRESSION L4 - L5 AND EXCISION 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;  Surgeon: Tobi Bastos, MD;  Location: [facility];  Service: Orthopedics;  Laterality: N/A;  . TOTAL KNEE ARTHROPLASTY Right 02/01/2013   Procedure: RIGHT TOTAL KNEE ARTHROPLASTY;  Surgeon: Tobi Bastos, MD;  Location: [facility];  Service: Orthopedics;  Laterality: Right;  . TOTAL KNEE ARTHROPLASTY Left 04/18/2014   Procedure: LEFT TOTAL KNEE ARTHROPLASTY;  Surgeon: Latanya Maudlin, MD;  Location: [facility];  Service: Orthopedics;  Laterality: Left;    Allergies  Allergen Reactions  . Aspirin Nausea Only    Can take 81 mg but can't take 325mg , hard on stomach.    Outpatient Encounter Medications as of 02/10/2019  Medication Sig  . Acetaminophen (TYLENOL EXTRA STRENGTH PO) Take 2 tablets by mouth 2 (two) times daily as needed.  Marland Kitchen aspirin EC  81 MG tablet Take 81 mg by mouth daily.  Marland Kitchen atorvastatin (LIPITOR) 40 MG tablet TAKE 1 TABLET BY MOUTH ONCE DAILY AT  6PM  . Calcium Carbonate-Vitamin D (OYSTER SHELL CALCIUM/D) 250-125 MG-UNIT TABS Take 1 tablet by mouth.  . carboxymethylcellulose (REFRESH PLUS) 0.5 % SOLN Place 1 drop into both eyes 3 (three) times daily as needed (dry eyes).   . Cholecalciferol (VITAMIN D-3) 1000 UNITS CAPS Take by mouth daily.  . COMBIGAN 0.2-0.5 % ophthalmic solution INT 1 GTT INTO OU BID  . latanoprost (XALATAN) 0.005 % ophthalmic solution Place 1 drop into both eyes at bedtime.  Marland Kitchen lisinopril (ZESTRIL) 5 MG tablet Take 1 tablet by mouth once daily  . meloxicam (MOBIC) 15  MG tablet meloxicam 15 mg tablet  Take 1 tablet every day by oral route for 30 days.  . metFORMIN (GLUCOPHAGE) 500 MG tablet TAKE 1/2 (ONE-HALF) TABLET BY MOUTH IN THE EVENING WITH SUPPER  . Multiple Vitamin (MULTIVITAMIN) tablet Take 1 tablet by mouth daily.  Marland Kitchen omega-3 acid ethyl esters (LOVAZA) 1 g capsule Take 1 g by mouth daily.   No facility-administered encounter medications on file as of 02/10/2019.    Review of Systems:  Review of Systems  Constitutional: Positive for malaise/fatigue. Negative for chills and fever.  HENT: Negative for congestion.   Respiratory: Negative for cough, shortness of breath and wheezing.   Cardiovascular: Negative for chest pain, palpitations and leg swelling.  Gastrointestinal: Positive for diarrhea. Negative for abdominal pain, blood in stool, constipation and melena.       Loose stools improved after meals with probiotic use  Genitourinary: Negative for dysuria.  Musculoskeletal: Positive for back pain. Negative for falls, joint pain and myalgias.       H/o bilateral TKAs  Skin: Negative for rash.  Neurological: Positive for tingling and sensory change. Negative for dizziness and loss of consciousness.       Unsteady gait  Psychiatric/Behavioral: Negative for depression. The patient has insomnia. The patient is not nervous/anxious.     Health Maintenance  Topic Date Due  . FOOT EXAM  12/28/2017  . OPHTHALMOLOGY EXAM  04/29/2018  . HEMOGLOBIN A1C  07/10/2019  . MAMMOGRAM  07/28/2020  . COLONOSCOPY  02/17/2023  . TETANUS/TDAP  10/29/2023  . INFLUENZA VACCINE  Completed  . DEXA SCAN  Completed  . Hepatitis C Screening  Completed  . PNA vac Low Risk Adult  Completed    Physical Exam: Vitals:   02/10/19 0920  BP: 138/82  Pulse: 80  Temp: (!) 97.1 F (36.2 C)  TempSrc: Temporal  SpO2: 98%  Weight: 215 lb (97.5 kg)  Height: 5\' 5"  (1.651 m)   Body mass index is 35.78 kg/m. Physical Exam Vitals reviewed.  Constitutional:       General: She is not in acute distress.    Appearance: Normal appearance. She is not toxic-appearing.  HENT:     Head: Normocephalic and atraumatic.  Cardiovascular:     Rate and Rhythm: Normal rate and regular rhythm.  Pulmonary:     Effort: Pulmonary effort is normal.     Breath sounds: Normal breath sounds. No wheezing, rhonchi or rales.  Abdominal:     General: Bowel sounds are normal. There is no distension.     Palpations: Abdomen is soft. There is no mass.     Tenderness: There is no abdominal tenderness. There is no guarding or rebound.  Musculoskeletal:        General: Normal range of  motion.     Right lower leg: No edema.     Left lower leg: No edema.  Skin:    General: Skin is warm and dry.     Comments: Severe fungus of nails   Neurological:     General: No focal deficit present.     Mental Status: She is alert and oriented to person, place, and time.  Psychiatric:        Mood and Affect: Mood normal.     Labs reviewed: Basic Metabolic Panel: Recent Labs    09/01/18 0808 01/09/19 0814 01/17/19 0813  NA 139 138 136  K 4.5 4.3 4.4  CL 107 104 103  CO2 27 26 26   GLUCOSE 120* 113* 120*  BUN 12 13 14   CREATININE 0.82 0.90 0.93  CALCIUM 9.8 9.6 9.9   Liver Function Tests: Recent Labs    09/01/18 0808 01/09/19 0814 01/17/19 0813  AST 24 65* 63*  ALT 26 76* 79*  BILITOT 0.5 0.5 0.6  PROT 7.3 7.7 7.8   No results for input(s): LIPASE, AMYLASE in the last 8760 hours. No results for input(s): AMMONIA in the last 8760 hours. CBC: Recent Labs    09/01/18 0808 01/09/19 0814 01/17/19 0813  WBC 9.7 10.0 12.1*  NEUTROABS 6,451 6,990 8,095*  HGB 12.3 12.3 12.5  HCT 38.1 38.3 38.6  MCV 88.2 88.5 87.5  PLT 378 398 418*   Lipid Panel: Recent Labs    09/01/18 0808 01/09/19 0814  CHOL 110 114  HDL 35* 38*  LDLCALC 58 59  TRIG 87 83  CHOLHDL 3.1 3.0   Lab Results  Component Value Date   HGBA1C 6.0 (H) 01/09/2019    Procedures since last  visit: US Abdomen Limited RUQ  Result Date: 01/27/2019 CLINICAL DATA:  Elevated transaminases. EXAM: ULTRASOUND ABDOMEN LIMITED RIGHT UPPER QUADRANT COMPARISON:  Chest CT without contrast, 09/20/2018. FINDINGS: Gallbladder: 1.5 cm mobile stone.  No wall thickening.  No pericholecystic fluid. Common bile duct: Diameter: 5 mm Liver: Normal in size. Heterogeneous echogenicity with interspersed areas of relative increased echogenicity, without a defined mass. No visible surface nodularity. Portal vein is patent on color Doppler imaging with normal direction of blood flow towards the liver. Other: None. IMPRESSION: 1. No acute findings. 2. Single gallstone.  No acute cholecystitis. 3. Abnormal appearance of the liver. Liver appears homogeneous on the prior unenhanced chest CT. Ultrasound findings could be due to heterogeneous fatty infiltration. Other infiltrative disorder including neoplastic disease is not excluded, however. Recommend follow-up liver MRI without and with contrast for further assessment. Electronically Signed   By: Lajean Manes M.D.   On: 01/27/2019 10:43    Assessment/Plan 1. Abnormal ultrasound of liver - has MRI next month (2/22 at 940am)--pt not to eat for 4 hrs before--given instructions today -was ordered on 02/04/19 - Hepatic function panel recheck  2. Gross hematuria -she reports pink urine occasionally -was treated for UTI and brown urine gone and cognition seems normal today - recheck Urinalysis, Routine w reflex microscopic primarily looking for blood--may need urology referral to r/o malignancy considering her smoking history  3. Type 2 diabetes mellitus with neurological complications ([facility]) - neuropathy progressing; previously tried lower dose gabapentin which didn't help--will try the 300 at bedtime this time - gabapentin (NEURONTIN) 300 MG capsule; Take 1 capsule (300 mg total) by mouth at bedtime.  Dispense: 90 capsule; Refill: 3 - Ambulatory referral to Physical  Therapy  4. Transaminitis - has been off her tylenol and  her statin - f/u lab today - Hepatic function panel  5. Balance problem - persists--reported only last time -seems like this is related to worsening of her neuropathy -also has had prior lumbar spinal stenosis surgery and bilateral knee replacements - Ambulatory referral to Physical Therapy for balance    Labs/tests ordered:  * No order type specified * Next appt:  06/09/2019   Shonique Pelphrey L. Juniper Cobey, D.O. [facility] [address], [place] [address] Cell Phone (Mon-Fri 8am-5pm):  [phone] On Call:  [phone] & follow prompts after 5pm & weekends Office Phone:  [phone] Office Fax:  [phone]

## 2019-02-10 NOTE — Progress Notes (Signed)
I will refer her to urology b/c she does have blood in her urine that's clear now that UTI has cleared.

## 2019-02-10 NOTE — Patient Instructions (Signed)
For your MRI of the liver at [place] [id number] Do not eat for 4 hours before the MRI on 03/06/19 at 920am  We'll try the gabapentin at bedtime for your neuropathy. I will refer you to PT for your balance.  We rechecked your liver tests today and your urine for blood.

## 2019-02-10 NOTE — Addendum Note (Signed)
Addended by: Gayland Curry on: 02/10/2019 05:34 PM   Modules accepted: Orders

## 2019-02-22 ENCOUNTER — Other Ambulatory Visit: Payer: Self-pay

## 2019-02-22 ENCOUNTER — Ambulatory Visit: Payer: Medicare Other | Attending: Internal Medicine | Admitting: Physical Therapy

## 2019-02-22 DIAGNOSIS — M25561 Pain in right knee: Secondary | ICD-10-CM | POA: Diagnosis not present

## 2019-02-22 DIAGNOSIS — R208 Other disturbances of skin sensation: Secondary | ICD-10-CM

## 2019-02-22 DIAGNOSIS — G8929 Other chronic pain: Secondary | ICD-10-CM | POA: Diagnosis not present

## 2019-02-22 DIAGNOSIS — R262 Difficulty in walking, not elsewhere classified: Secondary | ICD-10-CM | POA: Diagnosis not present

## 2019-02-22 DIAGNOSIS — M25562 Pain in left knee: Secondary | ICD-10-CM | POA: Diagnosis not present

## 2019-02-22 DIAGNOSIS — R209 Unspecified disturbances of skin sensation: Secondary | ICD-10-CM | POA: Diagnosis not present

## 2019-02-22 DIAGNOSIS — R2681 Unsteadiness on feet: Secondary | ICD-10-CM | POA: Insufficient documentation

## 2019-02-22 NOTE — Therapy (Signed)
[facility] [address] [place], [place], [address] Phone: [phone]   Fax:  [phone]  Physical Therapy Evaluation  Patient Details  Name: Alison [name] MRN: [id number] Date of Birth: 09-14-1945 Referring Provider (PT): Gayland Curry, DO   Encounter Date: 02/22/2019  PT End of Session - 02/22/19 1333    Visit Number  1    Number of Visits  11    Date for PT Re-Evaluation  05/03/19    Authorization Type  Medicare Primary/[facility] Secondary - 10th visit PN    PT Start Time  1017    PT Stop Time  1103    PT Time Calculation (min)  46 min    Activity Tolerance  Patient limited by fatigue    Behavior During Therapy  [facility] for tasks assessed/performed       Past Medical History:  Diagnosis Date  . Anxiety   . Benign essential hypertension   . Blood transfusion without reported diagnosis   . Diabetes mellitus without complication ([place])   . Diverticulitis of colon (without mention of hemorrhage)(562.11)   . Goiter, specified as simple   . Hyperlipidemia LDL goal < 100   . Numbness    TOES RT FOOT and toes left foot  . Obesity, unspecified   . Osteoarthrosis, unspecified whether generalized or localized, lower leg   . Osteopenia   . Other specified disease of nail    FUNGUS OF FINGERNAILS  . Pernicious anemia   . Unspecified glaucoma(365.9)   . Unspecified vitamin D deficiency     Past Surgical History:  Procedure Laterality Date  . BACK SURGERY    . CATARACT EXTRACTION     right eye 06/20/15 left eye 09/12/15  . COLECTOMY  2009  . JOINT REPLACEMENT     right  . LUMBAR LAMINECTOMY/DECOMPRESSION MICRODISCECTOMY N/A 03/30/2013   Procedure: CENTRAL DECOMPRESSION L4 - L5 AND EXCISION 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;  Surgeon: Tobi Bastos, MD;  Location: [facility];  Service: Orthopedics;  Laterality: N/A;  . TOTAL KNEE ARTHROPLASTY Right 02/01/2013   Procedure: RIGHT TOTAL KNEE ARTHROPLASTY;  Surgeon: Tobi Bastos, MD;  Location: [facility];  Service: Orthopedics;  Laterality: Right;  . TOTAL KNEE ARTHROPLASTY Left 04/18/2014   Procedure: LEFT TOTAL KNEE ARTHROPLASTY;  Surgeon: Latanya Maudlin, MD;  Location: [facility];  Service: Orthopedics;  Laterality: Left;    There were no vitals filed for this visit.   Subjective Assessment - 02/22/19 1022    Subjective  "I hope you can help me with my balance and my knees".  Knees have been hurting forever - was taking Tylenol but had to stop and has started on Mobic.  Has had bilat TKA.  Feet are not completed numb - just annoying - very restless at night.  Denies tingling and pins and needles but does report new onset of burning since starting gabapentin.  Pt feels like she gets off balance when walking -touches walls and furniture in the home, uses cane outside the home.    Pertinent History  anxiety, HTN, DM, HLD, diverticulitis, obesity, OA with bilat TKA, osteopenia, glaucoma, colectomy, lumbar laminectomy/decompression and cataract extraction    Patient Stated Goals  Help the pain and help the balance    Currently in Pain?  Yes    Pain Location  Knee    Pain Orientation  Right;Left    Pain Descriptors / Indicators  Aching;Radiating    Pain Radiating Towards  radiates  down lateral lower leg bilaterally; at night has some pain in the thighs and cramps    Pain Onset  More than a month ago    Pain Frequency  Constant    Pain Relieving Factors  Tylenol, Icy Hot, riding the bike         [facility] PT Assessment - 02/22/19 1029      Assessment   Medical Diagnosis  Imbalance due to peripheral neuropathy    Referring Provider (PT)  Gayland Curry, DO    Onset Date/Surgical Date  02/10/19    Prior Therapy  yes after TKA      Precautions   Precautions  Other (comment)    Precaution Comments  anxiety, HTN, DM, HLD, diverticulitis, obesity, OA with bilat TKA, osteopenia, glaucoma, colectomy, lumbar laminectomy/decompression and cataract extraction      Balance  Screen   Has the patient fallen in the past 6 months  No      [place] residence    Living Arrangements  Parent;Other relatives   brother   Type of [place] to enter;Ramped entrance    Entrance Stairs-Number of Steps  5    Entrance Stairs-Rails  [place]  One level    [place] - single point    Additional Comments  Patient takes care of her dad, brother comes and helps during the day.  Pt just started taking care of dad in September; before that took care of her mom.      Prior Function   Level of Independence  Independent    Vocation Requirements  Takes care of her dad - fix meals, medication management, father is bed ridden - helps with changing clothes and hygiene.  Does not transfer pt in/out of bed.  Helps with bed mobility.  CNA also comes and helps; father is on hospice.  Regular household chores    Leisure  [employer], plays games, rides stationary bike      Sensation   Light Touch  Impaired by gross assessment    Hot/Cold  Appears Intact    Proprioception  Appears Intact    Additional Comments  Intact to light touch but reports peripheral neuropathy - does not have to watch feet when walking, can sense hot/cold      ROM / Strength   AROM / PROM / Strength  Strength      Strength   Overall Strength  Deficits    Overall Strength Comments  RLE: 4-/5, LLE: 4+/5      Special Tests   Other special tests  Performed nerve palpation of LE to assess sensitivity of LE nerves and contribution to LE symptoms.  No significant sensitivities found      Ambulation/Gait   Ambulation/Gait  Yes    Ambulation/Gait Assistance  6: Modified independent (Device/Increase time)    Ambulation Distance (Feet)  115 Feet    Assistive device  Straight cane    Gait Pattern  Step-through pattern    Ambulation Surface  Level;Indoor      Standardized Balance Assessment   Standardized Balance Assessment   Berg Balance Test;Five Times Sit to Stand    Five times sit to stand comments   20.06 - fatigue after performing      Berg Balance Test   Sit to Stand  Able to stand  independently using hands    Standing Unsupported  Able to stand safely 2 minutes    Sitting with Back Unsupported but Feet Supported on Floor or Stool  Able to sit safely and securely 2 minutes    Stand to Sit  Controls descent by using hands    Transfers  Able to transfer safely, definite need of hands    Standing Unsupported with Eyes Closed  Able to stand 10 seconds with supervision    Standing Unsupported with Feet Together  Needs help to attain position and unable to hold for 15 seconds    From Standing, Reach Forward with Outstretched Arm  Can reach forward >12 cm safely (5")    From Standing Position, Pick up Object from Floor  Able to pick up shoe, needs supervision    From Standing Position, Turn to Look Behind Over each Shoulder  Looks behind one side only/other side shows less weight shift    Turn 360 Degrees  Able to turn 360 degrees safely but slowly    Standing Unsupported, Alternately Place Feet on Step/Stool  Able to complete >2 steps/needs minimal assist    Standing Unsupported, One Foot in Front  Able to take small step independently and hold 30 seconds    Standing on One Leg  Unable to try or needs assist to prevent fall    Total Score  34    Berg comment:  34/56                Objective measurements completed on examination: See above findings.              PT Education - 02/22/19 1330    Education Details  clinical findings, PT POC and goals; causes of peripheral neuropathy and educated pt that there is no "cure" for neuropathy but there are ways to promote blood flow, improve health of nervous system and prevent further damage to peripheral nerves (physical activity for aerobic conditioning, cessation of smoking, control of DM).    Person(s) Educated  Patient    Methods  Explanation     Comprehension  Verbalized understanding       PT Short Term Goals - 02/22/19 1339      PT SHORT TERM GOAL #1   Title  Pt will demonstrate independence with initial HEP and walking program for aerobic conditioning    Time  5    Period  Weeks    Status  New    Target Date  03/29/19      PT SHORT TERM GOAL #2   Title  Pt will decrease time to perform five time sit to stand to </= 17 seconds    Baseline  20 seconds with UE support    Time  5    Period  Weeks    Status  New    Target Date  03/29/19      PT SHORT TERM GOAL #3   Title  Pt will increase BERG score to >/= 38/56 to indicate decreased falls risk    Baseline  34/56    Time  5    Period  Weeks    Status  New    Target Date  03/29/19      PT SHORT TERM GOAL #4   Title  Pt will participate in further gait assessment with DGI/stair negotiation assessment    Time  5    Period  Weeks    Status  New    Target Date  03/29/19      PT SHORT TERM  GOAL #5   Title  Pt will ambulate x 115' over level indoor surfaces and negotiate around furniture to simulate home environment with cane MOD I without touching walls/furniture for extra support    Time  5    Period  Weeks    Status  New    Target Date  03/29/19        PT Long Term Goals - 02/22/19 1344      PT LONG TERM GOAL #1   Title  Pt will demonstrate independence with final LE strength/balance HEP, biking program and community wellness plan    Time  10    Period  Weeks    Status  New    Target Date  05/03/19      PT LONG TERM GOAL #2   Title  Pt will decrease five time sit to stand to </= 15 seconds from chair    Time  10    Period  Weeks    Status  New    Target Date  05/03/19      PT LONG TERM GOAL #3   Title  Pt will increase BERG to >/= 42/56 to indicate decreased falls risk    Time  10    Period  Weeks    Status  New    Target Date  05/03/19      PT LONG TERM GOAL #4   Title  Pt will increase DGI to >/= 18/24 with cane to indicate decreased risk  for falls in community    Baseline  TBD    Time  10    Period  Weeks    Status  New    Target Date  05/03/19      PT LONG TERM GOAL #5   Title  Pt will ambulate x 200' outside over paved surfaces and negotiate curb with cane with supervision    Time  10    Period  Weeks    Status  New    Target Date  05/03/19             Plan - 02/22/19 1334    Clinical Impression Statement  Pt is a 74 year old female referred to Neuro OPPT for evaluation of imbalance due to peripheral neuropathy.  Pt's PMH is significant for the following: anxiety, HTN, DM, HLD, diverticulitis, obesity, OA with bilat TKA, osteopenia, glaucoma, colectomy, lumbar laminectomy/decompression and cataract extraction. The following deficits were noted during pt's exam: impaired LE sensation, impaired LE strength, impaired standing balance, difficulty with walking and pain in bilat knees and lower legs.  Pt's five time sit to stand and BERG scores indicate pt is at significant risk for falls. Pt would benefit from skilled PT to address these impairments and functional limitations to maximize functional mobility independence and reduce falls risk.    Personal Factors and Comorbidities  Comorbidity 3+;Fitness;Social Background;Transportation    Comorbidities  anxiety, HTN, DM, HLD, diverticulitis, obesity, OA with bilat TKA, osteopenia, glaucoma, colectomy, lumbar laminectomy/decompression and cataract extraction    Examination-Activity Limitations  Caring for Others;Locomotion Level;Stairs;Stand    Examination-Participation Restrictions  Cleaning;Community Activity;Laundry;Meal Prep    Stability/Clinical Decision Making  Evolving/Moderate complexity    Clinical Decision Making  Moderate    Rehab Potential  Good    PT Frequency  1x / week    PT Duration  Other (comment)   10 weeks   PT Treatment/Interventions  ADLs/Self Care Home Management;Aquatic Therapy;Electrical Stimulation;Cryotherapy;Moist Heat;DME Instruction;Gait  training;Stair training;Functional mobility training;Therapeutic  activities;Therapeutic exercise;Balance training;Neuromuscular re-education;Patient/family education;Orthotic Fit/Training;Passive range of motion;Dry needling;Taping    PT Next Visit Plan  Assess stair negotiation and DGI - reset goal baselines.  Initiate standing balance HEP -  LE stretching, strengthening, sit <> stand, standing balance    Consulted and Agree with Plan of Care  Patient       Patient will benefit from skilled therapeutic intervention in order to improve the following deficits and impairments:  Decreased balance, Decreased activity tolerance, Decreased strength, Difficulty walking, Impaired sensation, Pain  Visit Diagnosis: Other disturbances of skin sensation  Unsteadiness on feet  Difficulty in walking, not elsewhere classified  Chronic pain of right knee  Chronic pain of left knee     Problem List Patient Active Problem List   Diagnosis Date Noted  . Transaminitis 02/04/2019  . Abnormal ultrasound of liver 02/04/2019  . Sessile rectal polyp 05/02/2018  . Internal hemorrhoids without complication [id number]  . Diverticulosis 05/02/2018  . 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 Chronic pain of both knees 08/19/2017  . Cataracts, bilateral 07/08/2015  . Hyperlipidemia associated with type 2 diabetes mellitus ([place]) 04/27/2015  . History of total knee arthroplasty 04/18/2014  . History of lumbar laminectomy for spinal cord decompression 05/04/2013  . Synovial cyst of lumbar spine 03/31/2013  . Spinal stenosis of lumbar region with neurogenic claudication 03/30/2013  . Synovial cyst of lumbar facet joint 03/30/2013  . Type 2 diabetes mellitus with neurological complications ([place]) [id number]  . Osteoarthritis of right knee 02/01/2013  . History of total knee replacement 02/01/2013  . Tobacco abuse 10/03/2012  . Hyperglycemia 10/03/2012  . Unspecified vitamin D deficiency   .  Class 2 severe obesity due to excess calories with serious comorbidity and body mass index (BMI) of 35.0 to 35.9 in adult [facility])   . Pernicious anemia   . Glaucoma (increased eye pressure)   . Benign essential hypertension   . Osteopenia     Rico Junker, PT, DPT 02/22/19    1:49 PM    [facility] [address] [place], [place], [address] Phone: [phone]   Fax:  [phone]  Name: Alison [name] MRN: [id number] Date of Birth: 23-Jun-1945

## 2019-02-23 ENCOUNTER — Ambulatory Visit: Payer: Medicare Other

## 2019-03-03 ENCOUNTER — Ambulatory Visit: Payer: Medicare Other | Admitting: Rehabilitation

## 2019-03-06 ENCOUNTER — Other Ambulatory Visit: Payer: Self-pay | Admitting: Internal Medicine

## 2019-03-06 ENCOUNTER — Other Ambulatory Visit: Payer: Self-pay

## 2019-03-06 ENCOUNTER — Ambulatory Visit
Admission: RE | Admit: 2019-03-06 | Discharge: 2019-03-06 | Disposition: A | Discharge status: Home / Self Care | Payer: Medicare Other | Source: Ambulatory Visit | Attending: Internal Medicine | Admitting: Internal Medicine

## 2019-03-06 DIAGNOSIS — K8020 Calculus of gallbladder without cholecystitis without obstruction: Secondary | ICD-10-CM

## 2019-03-06 DIAGNOSIS — E785 Hyperlipidemia, unspecified: Secondary | ICD-10-CM

## 2019-03-06 DIAGNOSIS — K769 Liver disease, unspecified: Secondary | ICD-10-CM

## 2019-03-06 DIAGNOSIS — E1169 Type 2 diabetes mellitus with other specified complication: Secondary | ICD-10-CM

## 2019-03-06 DIAGNOSIS — K7689 Other specified diseases of liver: Secondary | ICD-10-CM | POA: Diagnosis not present

## 2019-03-06 MED ORDER — GADOBENATE DIMEGLUMINE 529 MG/ML IV SOLN
20.0000 mL | Freq: Once | INTRAVENOUS | Status: AC | PRN
  Administered 2019-03-06: 20 mL via INTRAVENOUS

## 2019-03-06 MED ORDER — ATORVASTATIN CALCIUM 40 MG PO TABS: ORAL_TABLET | ORAL | 3 refills | Status: DC

## 2019-03-07 ENCOUNTER — Encounter: Payer: Self-pay | Admitting: Physical Therapy

## 2019-03-07 NOTE — Therapy (Deleted)
[facility] [address] [place], [place], [address] Phone: [phone]   Fax:  [phone]  Patient Details  Name: Alison [name] MRN: [id number] Date of Birth: [birth year] Referring Provider:  No ref. provider found  Encounter Date: 03/07/2019  PHYSICAL THERAPY DISCHARGE SUMMARY  Visits from Start of Care: 1 eval only  Current functional level related to goals / functional outcomes: Unable to assess; pt did not return after evaluation.  Stated she would not be able to participate in therapy right now due to needing to take care of her dad full time.   Remaining deficits: LE pain, weakness, impaired balance   Education / Equipment: N/A eval only  Plan: Patient agrees to discharge.  Patient goals were not met. Patient is being discharged due to the patient's request.  ?????     Rico Junker, PT, DPT 03/07/19    4:54 PM    [facility] [address] [place], [place], [address] Phone: [phone]   Fax:  [phone]

## 2019-03-07 NOTE — Therapy (Signed)
[facility] [address] [place], [place], [address] Phone: [phone]   Fax:  [phone]  Patient Details  Name: Tierney Wolden MRN: [id number] Date of Birth: 20-Sep-1945 Referring Provider:  No ref. provider found  Encounter Date: 03/07/2019  Discharge written for incorrect patient; disregard D/C note.  Pt is still active in physical therapy.  Rico Junker, PT, DPT 03/07/19    5:00 PM    [facility] [address] [place], [place], [address] Phone: [phone]   Fax:  [phone]

## 2019-03-09 ENCOUNTER — Ambulatory Visit: Payer: Medicare Other | Admitting: Physical Therapy

## 2019-03-10 ENCOUNTER — Ambulatory Visit (INDEPENDENT_AMBULATORY_CARE_PROVIDER_SITE_OTHER): Payer: Medicare Other | Admitting: Internal Medicine

## 2019-03-10 ENCOUNTER — Encounter: Payer: Self-pay | Admitting: Internal Medicine

## 2019-03-10 ENCOUNTER — Ambulatory Visit
Admission: RE | Admit: 2019-03-10 | Discharge: 2019-03-10 | Disposition: A | Discharge status: Home / Self Care | Payer: Medicare Other | Source: Ambulatory Visit | Attending: Internal Medicine | Admitting: Internal Medicine

## 2019-03-10 ENCOUNTER — Telehealth: Payer: Self-pay | Admitting: *Deleted

## 2019-03-10 ENCOUNTER — Other Ambulatory Visit: Payer: Self-pay

## 2019-03-10 VITALS — BP 140/92 | HR 75 | Temp 97.1°F | Ht 65.0 in | Wt 212.0 lb

## 2019-03-10 DIAGNOSIS — F17209 Nicotine dependence, unspecified, with unspecified nicotine-induced disorders: Secondary | ICD-10-CM | POA: Insufficient documentation

## 2019-03-10 DIAGNOSIS — Z716 Tobacco abuse counseling: Secondary | ICD-10-CM

## 2019-03-10 DIAGNOSIS — E1149 Type 2 diabetes mellitus with other diabetic neurological complication: Secondary | ICD-10-CM | POA: Diagnosis not present

## 2019-03-10 DIAGNOSIS — M5442 Lumbago with sciatica, left side: Secondary | ICD-10-CM

## 2019-03-10 DIAGNOSIS — K8020 Calculus of gallbladder without cholecystitis without obstruction: Secondary | ICD-10-CM

## 2019-03-10 DIAGNOSIS — M25561 Pain in right knee: Secondary | ICD-10-CM | POA: Diagnosis not present

## 2019-03-10 DIAGNOSIS — M25562 Pain in left knee: Secondary | ICD-10-CM

## 2019-03-10 DIAGNOSIS — R7401 Elevation of levels of liver transaminase levels: Secondary | ICD-10-CM

## 2019-03-10 DIAGNOSIS — G8929 Other chronic pain: Secondary | ICD-10-CM

## 2019-03-10 DIAGNOSIS — K8070 Calculus of gallbladder and bile duct without cholecystitis without obstruction: Secondary | ICD-10-CM

## 2019-03-10 DIAGNOSIS — N2889 Other specified disorders of kidney and ureter: Secondary | ICD-10-CM | POA: Diagnosis not present

## 2019-03-10 NOTE — Telephone Encounter (Signed)
Patient called and stated that she is scheduled for MRI tomorrow at [facility] at 3:50. Stated that Dr. Mariea Clonts wanted her to stop her Metformin and Meloxicam for 48 hours prior to the appointment. Patient wants to know if it is still ok to get the MRI done tomorrow even though its not going to be 48 hours holding medication. Please Advise.

## 2019-03-10 NOTE — Telephone Encounter (Signed)
Patient notified and agreed.  

## 2019-03-10 NOTE — Patient Instructions (Addendum)
Hold meloxicam/mobic and metformin for 48 hrs before your MRCP test.   Ok to take tylenol twice a day and your gabapentin at bedtime.

## 2019-03-10 NOTE — Telephone Encounter (Signed)
Just don't take anymore meloxicam or metformin today or tomorrow.  She may restart on Sunday.

## 2019-03-10 NOTE — Progress Notes (Signed)
Location:  [facility] Provider:  Caresse Sedivy L. Mariea Clonts, D.O., C.M.D.  Goals of Care:  Advanced Directives 03/10/2019  Does Patient Have a Medical Advance Directive? Yes  Type of Advance Directive Living will  Does patient want to make changes to medical advance directive? No - Guardian declined  Would patient like information on creating a medical advance directive? -  Pre-existing out of facility DNR order (yellow form or pink MOST form) -   Chief Complaint  Patient presents with  . Medical Management of Chronic Issues    4 week follow up on MRI for liver     HPI: Patient is a 74 y.o. female seen today for medical management of chronic diseases but primarily to follow-up on MRI of liver.  It showed concern for choledocholithiasis so MRCP was recommended and ordered immediately as stat on 03/06/19.  Unfortunately, it's not scheduled yet.  She says she thought she'd already had one when someone called her.    BP is elevated today.  Duffy Bruce, her father, passed away on Mar 24, 2022.  Says she can focus on her health now.    Reviewed that there is an area of calcification on her right kidney that may be a cyst vs aneurysm so we will check CT in 3 months.  She has a urology appt 03/17/19 to be seen about her hematuria.      Had PT session for neuropathy:  Went to one.  Second one weather was bad and second was day of her father's funeral.  She's not feeling the burning since starting the gabapentin '300mg'$  at hs.    Back still bothers her.  I noted when she was walking.  pain is over left SI region.  Slept in her recliner last night b/c she had guests for her dad's funeral.    Past Medical History:  Diagnosis Date  . Anxiety   . Benign essential hypertension   . Blood transfusion without reported diagnosis   . Diabetes mellitus without complication ([facility])   . Diverticulitis of colon (without mention of hemorrhage)(562.11)   . Goiter, specified as simple   . Hyperlipidemia LDL goal < 100   .  Numbness    TOES RT FOOT and toes left foot  . Obesity, unspecified   . Osteoarthrosis, unspecified whether generalized or localized, lower leg   . Osteopenia   . Other specified disease of nail    FUNGUS OF FINGERNAILS  . Pernicious anemia   . Unspecified glaucoma(365.9)   . Unspecified vitamin D deficiency     Past Surgical History:  Procedure Laterality Date  . BACK SURGERY    . CATARACT EXTRACTION     right eye 06/20/15 left eye 09/12/15  . COLECTOMY  2009  . JOINT REPLACEMENT     right  . LUMBAR LAMINECTOMY/DECOMPRESSION MICRODISCECTOMY N/A 03/30/2013   Procedure: CENTRAL DECOMPRESSION L4 - L5 AND EXCISION 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;  Surgeon: Tobi Bastos, MD;  Location: [facility];  Service: Orthopedics;  Laterality: N/A;  . TOTAL KNEE ARTHROPLASTY Right 02/01/2013   Procedure: RIGHT TOTAL KNEE ARTHROPLASTY;  Surgeon: Tobi Bastos, MD;  Location: [facility];  Service: Orthopedics;  Laterality: Right;  . TOTAL KNEE ARTHROPLASTY Left 04/18/2014   Procedure: LEFT TOTAL KNEE ARTHROPLASTY;  Surgeon: Latanya Maudlin, MD;  Location: [facility];  Service: Orthopedics;  Laterality: Left;    Allergies  Allergen Reactions  . Aspirin Nausea Only    Can take 81 mg but can't take '325mg'$ ,  hard on stomach.    Outpatient Encounter Medications as of 03/10/2019  Medication Sig  . Acetaminophen (TYLENOL EXTRA STRENGTH PO) Take 2 tablets by mouth 2 (two) times daily as needed.  Marland Kitchen aspirin EC 81 MG tablet Take 81 mg by mouth daily.  . Calcium Carbonate-Vitamin D (OYSTER SHELL CALCIUM/D) 250-125 MG-UNIT TABS Take 1 tablet by mouth.  . carboxymethylcellulose (REFRESH PLUS) 0.5 % SOLN Place 1 drop into both eyes 3 (three) times daily as needed (dry eyes).   . Cholecalciferol (VITAMIN D-3) 1000 UNITS CAPS Take by mouth daily.  . COMBIGAN 0.2-0.5 % ophthalmic solution INT 1 GTT INTO OU BID  . gabapentin (NEURONTIN) 300 MG capsule Take 1 capsule (300 mg total) by mouth at bedtime.  Marland Kitchen latanoprost (XALATAN)  0.005 % ophthalmic solution Place 1 drop into both eyes at bedtime.  Marland Kitchen lisinopril (ZESTRIL) 5 MG tablet Take 1 tablet by mouth once daily  . meloxicam (MOBIC) 15 MG tablet meloxicam 15 mg tablet  Take 1 tablet every day by oral route for 30 days.  . metFORMIN (GLUCOPHAGE) 500 MG tablet TAKE 1/2 (ONE-HALF) TABLET BY MOUTH IN THE EVENING WITH SUPPER  . Multiple Vitamin (MULTIVITAMIN) tablet Take 1 tablet by mouth daily.  Marland Kitchen omega-3 acid ethyl esters (LOVAZA) 1 g capsule Take 1 g by mouth daily.  . [DISCONTINUED] atorvastatin (LIPITOR) 40 MG tablet TAKE 1 TABLET BY MOUTH ONCE DAILY AT  6PM   No facility-administered encounter medications on file as of 03/10/2019.    Review of Systems:  Review of Systems  Constitutional: Negative for chills, fever and malaise/fatigue.  Eyes:       Glasses  Respiratory: Negative for cough, sputum production and shortness of breath.   Cardiovascular: Negative for chest pain, palpitations and leg swelling.  Gastrointestinal: Positive for abdominal pain. Negative for blood in stool, constipation, diarrhea, melena, nausea and vomiting.  Genitourinary: Negative for dysuria.       No mention of hematuria today, has urology appt scheduled  Musculoskeletal: Positive for back pain and joint pain. Negative for falls.  Skin: Negative for itching and rash.  Neurological: Positive for tingling and sensory change. Negative for dizziness and loss of consciousness.       No longer burning in feet since gabapentin, but still some numbness and tingling  Psychiatric/Behavioral: Positive for depression and memory loss. The patient is not nervous/anxious and does not have insomnia.        Grieving loss of her father (funeral was just yesterday, he was on hospice); seems to be forgetting more lately    Health Maintenance  Topic Date Due  . FOOT EXAM  12/28/2017  . OPHTHALMOLOGY EXAM  04/29/2018  . HEMOGLOBIN A1C  07/10/2019  . MAMMOGRAM  07/28/2020  . COLONOSCOPY  02/17/2023   . TETANUS/TDAP  10/29/2023  . INFLUENZA VACCINE  Completed  . DEXA SCAN  Completed  . Hepatitis C Screening  Completed  . PNA vac Low Risk Adult  Completed    Physical Exam: Vitals:   03/10/19 0847  BP: (!) 140/92  Pulse: 75  Temp: (!) 97.1 F (36.2 C)  SpO2: 98%  Weight: 212 lb (96.2 kg)  Height: '5\' 5"'$  (1.651 m)   Body mass index is 35.28 kg/m. Physical Exam  Labs reviewed: Basic Metabolic Panel: Recent Labs    09/01/18 0808 01/09/19 0814 01/17/19 0813  NA 139 138 136  K 4.5 4.3 4.4  CL 107 104 103  CO2 '27 26 26  '$ GLUCOSE 120* 113*  120*  BUN '12 13 14  '$ CREATININE 0.82 0.90 0.93  CALCIUM 9.8 9.6 9.9   Liver Function Tests: Recent Labs    01/09/19 0814 01/17/19 0813 02/10/19 1040  AST 65* 63* 26  ALT 76* 79* 28  BILITOT 0.5 0.6 0.4  PROT 7.7 7.8 7.6   No results for input(s): LIPASE, AMYLASE in the last 8760 hours. No results for input(s): AMMONIA in the last 8760 hours. CBC: Recent Labs    09/01/18 0808 01/09/19 0814 01/17/19 0813  WBC 9.7 10.0 12.1*  NEUTROABS 6,451 6,990 8,095*  HGB 12.3 12.3 12.5  HCT 38.1 38.3 38.6  MCV 88.2 88.5 87.5  PLT 378 398 418*   Lipid Panel: Recent Labs    09/01/18 0808 01/09/19 0814  CHOL 110 114  HDL 35* 38*  LDLCALC 58 59  TRIG 87 83  CHOLHDL 3.1 3.0   Lab Results  Component Value Date   HGBA1C 6.0 (H) 01/09/2019    Procedures since last visit: MR LIVER W WO CONTRAST  Result Date: 03/06/2019 CLINICAL DATA:  Liver lesion. Elevated liver enzymes. EXAM: MRI ABDOMEN WITHOUT AND WITH CONTRAST TECHNIQUE: Multiplanar multisequence MR imaging of the abdomen was performed both before and after the administration of intravenous contrast. CONTRAST:  84m MULTIHANCE GADOBENATE DIMEGLUMINE 529 MG/ML IV SOLN COMPARISON:  CT chest of 09/20/2018, recent abdominal sonogram from January of 2021 FINDINGS: Lower chest: No signs of pleural effusion or consolidation. Limited assessment of the lower chest on MRI.  Hepatobiliary: Lobular hepatic contours. No significant signs of fatty infiltration of the liver with homogeneous signal throughout the liver on in and out of phase T1 weighted gradient echo imaging. Probable 5 mm filling defect in the distal common bile duct, study was not performed as a dedicated MRCP. There is cholelithiasis. These gallstones display mixed signal intensity on T1 and are hypointense on T2. The area of concern in the distal common bile duct is hypointense on T2 and hyperintense on T1 best seen on coronal image eighteen of series 7 and on T1 weighted gradient echo sequence image 41 of series 11, perhaps as large as 7 mm as noted on this imaging data set. Pancreas: No signs of pancreatic lesion, ductal dilation or peripancreatic inflammation. Spleen:  Spleen is normal size without focal lesion. Adrenals/Urinary Tract: Adrenal glands are normal. No signs of hydronephrosis. Peripherally calcified nonenhancing lesion arises from the medial right kidney. Small cyst in the posterior left kidney. Stomach/Bowel: Stomach and small bowel and in general gastrointestinal contrast ends are normal to the extent they are visualized on this abdominal evaluation. Pelvic bowel loops are not imaged on study was not protocol for bowel evaluation. Vascular/Lymphatic: Vascular structures in the abdomen are patent. Signs of atherosclerotic calcification in the abdominal aorta. No signs of adenopathy. Other: Signs of ventral abdominal wall reconstruction with laxity and diastasis of rectus musculature, incompletely imaged. Musculoskeletal: No destructive bone process or acute bone lesion. IMPRESSION: 1. Strong suspicion for choledocholithiasis seen on multiple series. This could be confirmed with MRCP sequences particularly if intervention is considered. 2. No suspicious hepatic lesion 3. Or sign of infiltrative process to include fat deposition. Cholelithiasis. 4. Lobular hepatic contours with no significant signs of  fatty infiltration of the liver. 5. Peripherally calcified right renal lesion may represent calcified cyst. Given location peripherally calcified small splenic artery aneurysm is considered, size is stable at between 1.3 and 1.5 cm. Consider follow-up within 3-6 months with renal protocol CT. 6. Signs of ventral abdominal wall reconstruction  with laxity and diastasis of rectus musculature, incompletely imaged. 7. These results were called by telephone at the time of interpretation on 03/06/2019 at 11:06 am to provider [facility] Robina Hamor , who verbally acknowledged these results. Electronically Signed   By: Zetta Bills M.D.   On: 03/06/2019 11:06    Assessment/Plan 1. Calculus of gallbladder without cholecystitis without obstruction -seen on MRI of liver--radiologist recommended MRCP due to a gallstone seen in her duct on the MRI -next step will be GI referral for ERCP if it persists -still having some abdominal and back pain -transaminitis resolved--alk phos had been highest -hold metformin and mobic 48 hr prior to MRCP when scheduled  2. Type 2 diabetes mellitus with neurological complications ([facility]) -control is good on metformin; would like to add jardiance if affordable after we get her through all of these tests (avoiding adding more renally metabolized drugs for now) Lab Results  Component Value Date   HGBA1C 6.0 (H) 01/09/2019   3. Transaminitis -improved on rechecks when reduced tylenol and stopped statin, but now turns out it may have actually been due to gallstone instead--liver itself appeared ok  4. Tobacco use disorder, continuous -ongoing and not currently ready to quit (as below)  5. Tobacco abuse counseling -counseled again on importance of smoking cessation--she will consider working on this after she recovers from her father's passing  6. Chronic pain of both knees -cont mobic with food, cycling for exercise, would benefit from stretches also  7. Chronic left-sided low back  pain with left-sided sciatica -cont PT, mobic (except before MRCP) with food -would benefit from some stretching   8.  Renal artery calcification -right -for CT renal protocol in 3 mos--will order at future visit  Labs/tests ordered:  MRCP pending scheduling--pt to call Manus Gunning back a [facility]  Next appt:  04/06/2019 f/u after MRCP, possibly GI visit and urology visit, plan to order renal study for 3 mos also  40 mins spent with patient discussing loss of her father, coordinating care --arranging MRCP while she was here.  Brynlee Pennywell L. Triston Skare, D.O. [facility] [address], [place] [address] Cell Phone (Mon-Fri 8am-5pm):  [phone] On Call:  [phone] & follow prompts after 5pm & weekends Office Phone:  [phone] Office Fax:  [phone]

## 2019-03-11 ENCOUNTER — Ambulatory Visit
Admission: RE | Admit: 2019-03-11 | Discharge: 2019-03-11 | Disposition: A | Discharge status: Home / Self Care | Payer: Medicare Other | Source: Ambulatory Visit | Attending: Internal Medicine | Admitting: Internal Medicine

## 2019-03-11 DIAGNOSIS — K8020 Calculus of gallbladder without cholecystitis without obstruction: Secondary | ICD-10-CM | POA: Diagnosis not present

## 2019-03-11 DIAGNOSIS — R935 Abnormal findings on diagnostic imaging of other abdominal regions, including retroperitoneum: Secondary | ICD-10-CM | POA: Diagnosis not present

## 2019-03-11 MED ORDER — GADOBENATE DIMEGLUMINE 529 MG/ML IV SOLN
20.0000 mL | Freq: Once | INTRAVENOUS | Status: AC | PRN
  Administered 2019-03-11: 20 mL via INTRAVENOUS

## 2019-03-13 ENCOUNTER — Telehealth: Payer: Self-pay | Admitting: Gastroenterology

## 2019-03-13 ENCOUNTER — Encounter: Payer: Self-pay | Admitting: Gastroenterology

## 2019-03-13 ENCOUNTER — Telehealth: Payer: Self-pay | Admitting: *Deleted

## 2019-03-13 NOTE — Progress Notes (Signed)
MRCP did show a stone in her common bile duct and sludge and stones in her gallbladder.  I will refer her to Gastroenterology for ERCP.  There is also mention of the kidney calcification that we need to follow up on in 3 months.

## 2019-03-13 NOTE — Telephone Encounter (Signed)
Please contact patient and encourage her to see Carl Best, RNP in the [facility] office tomorrow or Wed.

## 2019-03-13 NOTE — Telephone Encounter (Signed)
 Imaging was calling to make sure patient's MRI Results she had done on 03/11/19 came through to Dr. Mariea Clonts. Confirmed that they are in patient's chart and was routed to Dr. Mariea Clonts.

## 2019-03-13 NOTE — Addendum Note (Signed)
Addended by: Gayland Curry on: 03/13/2019 12:24 PM   Modules accepted: Orders

## 2019-03-13 NOTE — Telephone Encounter (Signed)
I noticed it was there, but I've been with patients.  I will address now.

## 2019-03-14 ENCOUNTER — Other Ambulatory Visit (INDEPENDENT_AMBULATORY_CARE_PROVIDER_SITE_OTHER): Payer: Medicare Other

## 2019-03-14 ENCOUNTER — Ambulatory Visit (INDEPENDENT_AMBULATORY_CARE_PROVIDER_SITE_OTHER): Payer: Medicare Other | Admitting: Nurse Practitioner

## 2019-03-14 ENCOUNTER — Telehealth: Payer: Self-pay | Admitting: General Surgery

## 2019-03-14 ENCOUNTER — Other Ambulatory Visit: Payer: Self-pay

## 2019-03-14 ENCOUNTER — Encounter: Payer: Self-pay | Admitting: Nurse Practitioner

## 2019-03-14 VITALS — BP 136/94 | HR 76 | Temp 97.9°F | Ht 65.0 in | Wt 212.0 lb

## 2019-03-14 DIAGNOSIS — K831 Obstruction of bile duct: Secondary | ICD-10-CM

## 2019-03-14 LAB — BASIC METABOLIC PANEL
BUN: 11 mg/dL (ref 6–23)
CO2: 30 mEq/L (ref 19–32)
Calcium: 10 mg/dL (ref 8.4–10.5)
Chloride: 101 mEq/L (ref 96–112)
Creatinine, Ser: 0.83 mg/dL (ref 0.40–1.20)
GFR: 81.24 mL/min (ref >60.00)
Glucose, Bld: 105 mg/dL — ABNORMAL HIGH (ref 70–99)
Potassium: 4.1 mEq/L (ref 3.5–5.1)
Sodium: 136 mEq/L (ref 135–145)

## 2019-03-14 LAB — CBC WITH DIFFERENTIAL/PLATELET
Basophils Absolute: 0.1 10*3/uL (ref 0.0–0.1)
Basophils Relative: 0.7 % (ref 0.0–3.0)
Eosinophils Absolute: 0.3 10*3/uL (ref 0.0–0.7)
Eosinophils Relative: 2.6 % (ref 0.0–5.0)
HCT: 39.4 % (ref 36.0–46.0)
Hemoglobin: 13 g/dL (ref 12.0–15.0)
Lymphocytes Relative: 29.1 % (ref 12.0–46.0)
Lymphs Abs: 2.8 10*3/uL (ref 0.7–4.0)
MCHC: 33 g/dL (ref 30.0–36.0)
MCV: 88.1 fl (ref 78.0–100.0)
Monocytes Absolute: 0.7 10*3/uL (ref 0.1–1.0)
Monocytes Relative: 6.9 % (ref 3.0–12.0)
Neutro Abs: 5.9 10*3/uL (ref 1.4–7.7)
Neutrophils Relative %: 60.7 % (ref 43.0–77.0)
Platelets: 324 10*3/uL (ref 150.0–400.0)
RBC: 4.47 Mil/uL (ref 3.87–5.11)
RDW: 14.2 % (ref 11.5–15.5)
WBC: 9.7 10*3/uL (ref 4.0–10.5)

## 2019-03-14 LAB — HEPATIC FUNCTION PANEL
ALT: 21 U/L (ref 0–35)
AST: 23 U/L (ref 0–37)
Albumin: 4.2 g/dL (ref 3.5–5.2)
Alkaline Phosphatase: 77 U/L (ref 39–117)
Bilirubin, Direct: 0.1 mg/dL (ref 0.0–0.3)
Total Bilirubin: 0.4 mg/dL (ref 0.2–1.2)
Total Protein: 8.2 g/dL (ref 6.0–8.3)

## 2019-03-14 LAB — PROTIME-INR
INR: 1 ratio (ref 0.8–1.0)
Prothrombin Time: 11.3 s (ref 9.6–13.1)

## 2019-03-14 NOTE — Patient Instructions (Addendum)
If you are age 74 or older, your body mass index should be between 23-30. Your Body mass index is 35.28 kg/m. If this is out of the aforementioned range listed, please consider follow up with your Primary Care Provider.  If you are age 65 or younger, your body mass index should be between 19-25. Your Body mass index is 35.28 kg/m. If this is out of the aformentioned range listed, please consider follow up with your Primary Care Provider.   Your provider has requested that you have lab work today. We ask that you go to our [facility] office at [address] [place],  [address]. Please enter through the main entrance and go to the elevator.  Press "B" on the elevator. The lab is located at the first door on the left as you exit the elevator.  Further follow up to be determined after labs and ERCP are completed   We will call to scheduled your ERCP with Dr Fuller Plan, if you do not hear from our office within 1 week please contact us at [phone]  Due to recent changes in healthcare laws, you may see the results of your imaging and laboratory studies on MyChart before your provider has had a chance to review them.  We understand that in some cases there may be results that are confusing or concerning to you. Not all laboratory results come back in the same time frame and the provider may be waiting for multiple results in order to interpret others.  Please give [place] 48 hours in order for your provider to thoroughly review all the results before contacting the office for clarification of your results.   Thank you for choosing [facility] Noralyn Pick, CRNP

## 2019-03-14 NOTE — Telephone Encounter (Signed)
-----   Message from Letta Pate, [place] sent at 03/14/2019 11:24 AM EST ----- Regarding: schedule ERCP Schedule ERCP

## 2019-03-14 NOTE — Addendum Note (Signed)
Addended by: Lanny Hurst A on: 03/14/2019 03:05 PM   Modules accepted: Orders, SmartSet

## 2019-03-14 NOTE — Addendum Note (Signed)
Addended by: Lanny Hurst A on: 03/14/2019 01:35 PM   Modules accepted: Orders, SmartSet

## 2019-03-14 NOTE — Progress Notes (Signed)
03/14/2019 Alison [name] [id number] 04/10/1945   Chief Complaint: common bile duct stones, elevated liver enzymes.   History of Present Illness: Alison [name] is a 74 year old female with a past medial history of anxiety, hypertension, DM II, diverticular disease s/p right colon resection in 2009. She underwent routine laboratory studies at the time of her annual physical with Dr. Mariea Clonts 01/09/2019 which showed elevated LFTs. Alk phos 226. AST 65. ALT 76. T. Bili 0.5. An abdominal sonogram 01/27/2019 showed a single gallstone, no evidence of cholecystitis with an abnormal appearance of the liver, possible fatty infiltration. A liver MRI was done 03/06/2019 which showed evidence of choledocholithiasis and lobular hepatic contours without suspicious lesions. An abdominal MRI/MRCP was done 03/11/2019 which showed choledocholithiasis, sludge and stones in the gallbladder and biliary, common bile duct and intra hepatic ducts with mild ductal distention. She was referred to Dr. Fuller Plan for further evaluation and ERCP.  She remains asymptomatic. She denies having any nausea or vomiting. No upper or lower abdominal pan. No chest or upper back pain. Appetite is fair. She has lost a few lbs over the past week which she attributes to grieving as her father died at the age of 15 last week. No fever, sweats or chills. She is passing normal formed brown stool once daily. No rectal bleeding or black stools. Stools darker after she eats greens. Her last flexible sigmoidoscopy by Dr. Fuller Plan was 02/16/2018 which showed one 67m tubular adenomatous  polyp in the rectum, patent ileo colonic anastomosis and a single diverticulum. She was advised to repeat a colonoscopy in 5 years. No family history of liver, pancreatic or colon cancer. She has balance issues for which she uses a cane and has physical therapy. No alcohol use. No other complaints today.   CMP Latest Ref Rng & Units 02/10/2019 01/17/2019 01/09/2019  Glucose 65 - 99  mg/dL - 120(H) 113(H)  BUN 7 - 25 mg/dL - 14 13  Creatinine 0.60 - 0.93 mg/dL - 0.93 0.90  Sodium 135 - 146 mmol/L - 136 138  Potassium 3.5 - 5.3 mmol/L - 4.4 4.3  Chloride 98 - 110 mmol/L - 103 104  CO2 20 - 32 mmol/L - 26 26  Calcium 8.6 - 10.4 mg/dL - 9.9 9.6  Total Protein 6.1 - 8.1 g/dL 7.6 7.8 7.7  Total Bilirubin 0.2 - 1.2 mg/dL 0.4 0.6 0.5  Alkaline Phos 33 - 130 U/L - - -  AST 10 - 35 U/L 26 63(H) 65(H)  ALT 6 - 29 U/L 28 79(H) 76(H)   CBC Latest Ref Rng & Units 01/17/2019 01/09/2019 09/01/2018  WBC 3.8 - 10.8 Thousand/uL 12.1(H) 10.0 9.7  Hemoglobin 11.7 - 15.5 g/dL 12.5 12.3 12.3  Hematocrit 35.0 - 45.0 % 38.6 38.3 38.1  Platelets 140 - 400 Thousand/uL 418(H) 398 378    Abdominal sonogram 01/27/2019:  1. No acute findings. 2. Single gallstone.  No acute cholecystitis. 3. Abnormal appearance of the liver. Liver appears homogeneous on the prior unenhanced chest CT. Ultrasound findings could be due to heterogeneous fatty infiltration. Other infiltrative disorder including neoplastic disease is not excluded, however. Recommend follow-up liver MRI without and with contrast for further Assessment.  Liver MRI 03/06/2019:  1. Strong suspicion for choledocholithiasis seen on multiple series. This could be confirmed with MRCP sequences particularly if intervention is considered. 2. No suspicious hepatic lesion 3. Or sign of infiltrative process to include fat deposition. Cholelithiasis. 4. Lobular hepatic contours with no significant signs of fatty  infiltration of the liver. 5. Peripherally calcified right renal lesion may represent calcified cyst. Given location peripherally calcified small splenic artery aneurysm is considered, size is stable at between 1.3 and 1.5 cm. Consider follow-up within 3-6 months with renal protocol CT. 6. Signs of ventral abdominal wall reconstruction with laxity and diastasis of rectus musculature, incompletely imaged   Abdominal MRI with  contrast and  MRCP 03/11/2019: 1. Signs of choledocholithiasis confirmed with MRCP. 2. Sludge and stones in the gallbladder largest approximately 1.3 cm. 3. 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 For clarification as suggested a three-month follow-up may be helpful utilizing renal protocol CT. Addendum to report: Biliary duct, common bile duct and intrahepatic ducts with mild ductal distension, perhaps slightly increased from the previous imaging study approximately 7 mm for the extrahepatic biliary tree, common bile duct as compared to 5-6 mm on the previous study.   Past Medical History:  Diagnosis Date  . Anxiety   . Benign essential hypertension   . Blood transfusion without reported diagnosis   . Diabetes mellitus without complication ([place])   . Diverticulitis of colon (without mention of hemorrhage)(562.11)   . Goiter, specified as simple   . Hyperlipidemia LDL goal < 100   . Numbness    TOES RT FOOT and toes left foot  . Obesity, unspecified   . Osteoarthrosis, unspecified whether generalized or localized, lower leg   . Osteopenia   . Other specified disease of nail    FUNGUS OF FINGERNAILS  . Pernicious anemia   . Unspecified glaucoma(365.9)   . Unspecified vitamin D deficiency    Past Surgical History:  Procedure Laterality Date  . BACK SURGERY    . CATARACT EXTRACTION     right eye 06/20/15 left eye 09/12/15  . COLECTOMY  2009  . JOINT REPLACEMENT     right  . LUMBAR LAMINECTOMY/DECOMPRESSION MICRODISCECTOMY N/A 03/30/2013   Procedure: CENTRAL DECOMPRESSION L4 - L5 AND EXCISION 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;  Surgeon: Tobi Bastos, MD;  Location: [facility];  Service: Orthopedics;  Laterality: N/A;  . TOTAL KNEE ARTHROPLASTY Right 02/01/2013   Procedure: RIGHT TOTAL KNEE ARTHROPLASTY;  Surgeon: Tobi Bastos, MD;   Location: [facility];  Service: Orthopedics;  Laterality: Right;  . TOTAL KNEE ARTHROPLASTY Left 04/18/2014   Procedure: LEFT TOTAL KNEE ARTHROPLASTY;  Surgeon: Latanya Maudlin, MD;  Location: [facility];  Service: Orthopedics;  Laterality: Left;   Current Outpatient Medications on File Prior to Visit  Medication Sig Dispense Refill  . Acetaminophen (TYLENOL EXTRA STRENGTH PO) Take 2 tablets by mouth 2 (two) times daily as needed.    Marland Kitchen aspirin EC 81 MG tablet Take 81 mg by mouth daily.    . Calcium Carbonate-Vitamin D (OYSTER SHELL CALCIUM/D) 250-125 MG-UNIT TABS Take 1 tablet by mouth.    . carboxymethylcellulose (REFRESH PLUS) 0.5 % SOLN Place 1 drop into both eyes 3 (three) times daily as needed (dry eyes).     . Cholecalciferol (VITAMIN D-3) 1000 UNITS CAPS Take by mouth daily.    . COMBIGAN 0.2-0.5 % ophthalmic solution INT 1 GTT INTO OU BID  3  . gabapentin (NEURONTIN) 300 MG capsule Take 1 capsule (300 mg total) by mouth at bedtime. 90 capsule 3  . latanoprost (XALATAN) 0.005 % ophthalmic solution Place 1 drop into both eyes at bedtime.    Marland Kitchen lisinopril (ZESTRIL) 5 MG  tablet Take 1 tablet by mouth once daily 90 tablet 3  . meloxicam (MOBIC) 15 MG tablet meloxicam 15 mg tablet  Take 1 tablet every day by oral route for 30 days.    . metFORMIN (GLUCOPHAGE) 500 MG tablet TAKE 1/2 (ONE-HALF) TABLET BY MOUTH IN THE EVENING WITH SUPPER 45 tablet 1  . Multiple Vitamin (MULTIVITAMIN) tablet Take 1 tablet by mouth daily.    Marland Kitchen omega-3 acid ethyl esters (LOVAZA) 1 g capsule Take 1 g by mouth daily.     No current facility-administered medications on file prior to visit.   Allergies  Allergen Reactions  . Aspirin Nausea Only    Can take 81 mg but can't take '325mg'$ , hard on stomach.    Current Medications, Allergies, Past Medical History, Past Surgical History, Family History and Social History were reviewed in [employer] record.   Physical Exam: BP (!) 136/94   Pulse 76    Temp 97.9 F (36.6 C)   Ht '5\' 5"'$  (1.651 m)   Wt 212 lb (96.2 kg)   BMI 35.28 kg/m  General: Well developed 74 year old female in no acute distress, she utilizes a cane with ambulation.  Head: Normocephalic and atraumatic. Eyes:  No scleral icterus. Conjunctiva pink . Ears: Normal auditory acuity. Mouth: Absent upper dentition, poor lower dentition. Brown coating on tongue.  Lungs: Clear throughout to auscultation. Heart: Regular rate and rhythm, no murmur. Abdomen: Soft, nontender and nondistended. No masses or hepatomegaly. Normal bowel sounds x 4 quadrants.  Rectal: Deferred.  Musculoskeletal: Symmetrical with no gross deformities. Extremities: No edema. Neurological: Alert oriented x 4. No focal deficits.  Psychological:  Alert and cooperative. Normal mood and affect  Assessment and Recommendations:  1. 31 your old female with elevated LFTs and alk phos levels consistent with cholestasis with cholelithiasis, choledocholithiasis and biliary, CBD and intra hepatic duct distension.  -ERCP with Dr Fuller Plan. ERCP benefits and risks discussed including risk with sedation, risk of bleeding, perforation, infection, pancreatitis.  -Hepatic panel, BMP, PT/INR, ANA, SMA and AMA -She will need general surgery consult after ERCP, further recommendations per Dr. Fuller Plan -She is aware to go to the local emergency room if she develops chest pain, upper back pain, severe upper abdominal pain, N/V or fever.   2. S/P right colon resection secondary to diverticular disease   3. History of colon polyps, next colonoscopy due 02/2023

## 2019-03-14 NOTE — H&P (View-Only) (Signed)
03/14/2019 Alison [name] [id number] 1945-02-10   Chief Complaint: common bile duct stones, elevated liver enzymes.   History of Present Illness: Alison [name] is a 74 year old female with a past medial history of anxiety, hypertension, DM II, diverticular disease s/p right colon resection in 2009. She underwent routine laboratory studies at the time of her annual physical with Dr. Mariea Clonts 01/09/2019 which showed elevated LFTs. Alk phos 226. AST 65. ALT 76. T. Bili 0.5. An abdominal sonogram 01/27/2019 showed a single gallstone, no evidence of cholecystitis with an abnormal appearance of the liver, possible fatty infiltration. A liver MRI was done 03/06/2019 which showed evidence of choledocholithiasis and lobular hepatic contours without suspicious lesions. An abdominal MRI/MRCP was done 03/11/2019 which showed choledocholithiasis, sludge and stones in the gallbladder and biliary, common bile duct and intra hepatic ducts with mild ductal distention. She was referred to Dr. Fuller Plan for further evaluation and ERCP.  She remains asymptomatic. She denies having any nausea or vomiting. No upper or lower abdominal pan. No chest or upper back pain. Appetite is fair. She has lost a few lbs over the past week which she attributes to grieving as her father died at the age of 28 last week. No fever, sweats or chills. She is passing normal formed brown stool once daily. No rectal bleeding or black stools. Stools darker after she eats greens. Her last flexible sigmoidoscopy by Dr. Fuller Plan was 02/16/2018 which showed one 48m tubular adenomatous  polyp in the rectum, patent ileo colonic anastomosis and a single diverticulum. She was advised to repeat a colonoscopy in 5 years. No family history of liver, pancreatic or colon cancer. She has balance issues for which she uses a cane and has physical therapy. No alcohol use. No other complaints today.   CMP Latest Ref Rng & Units 02/10/2019 01/17/2019 01/09/2019  Glucose 65 - 99  mg/dL - 120(H) 113(H)  BUN 7 - 25 mg/dL - 14 13  Creatinine 0.60 - 0.93 mg/dL - 0.93 0.90  Sodium 135 - 146 mmol/L - 136 138  Potassium 3.5 - 5.3 mmol/L - 4.4 4.3  Chloride 98 - 110 mmol/L - 103 104  CO2 20 - 32 mmol/L - 26 26  Calcium 8.6 - 10.4 mg/dL - 9.9 9.6  Total Protein 6.1 - 8.1 g/dL 7.6 7.8 7.7  Total Bilirubin 0.2 - 1.2 mg/dL 0.4 0.6 0.5  Alkaline Phos 33 - 130 U/L - - -  AST 10 - 35 U/L 26 63(H) 65(H)  ALT 6 - 29 U/L 28 79(H) 76(H)   CBC Latest Ref Rng & Units 01/17/2019 01/09/2019 09/01/2018  WBC 3.8 - 10.8 Thousand/uL 12.1(H) 10.0 9.7  Hemoglobin 11.7 - 15.5 g/dL 12.5 12.3 12.3  Hematocrit 35.0 - 45.0 % 38.6 38.3 38.1  Platelets 140 - 400 Thousand/uL 418(H) 398 378    Abdominal sonogram 01/27/2019:  1. No acute findings. 2. Single gallstone.  No acute cholecystitis. 3. Abnormal appearance of the liver. Liver appears homogeneous on the prior unenhanced chest CT. Ultrasound findings could be due to heterogeneous fatty infiltration. Other infiltrative disorder including neoplastic disease is not excluded, however. Recommend follow-up liver MRI without and with contrast for further Assessment.  Liver MRI 03/06/2019:  1. Strong suspicion for choledocholithiasis seen on multiple series. This could be confirmed with MRCP sequences particularly if intervention is considered. 2. No suspicious hepatic lesion 3. Or sign of infiltrative process to include fat deposition. Cholelithiasis. 4. Lobular hepatic contours with no significant signs of fatty  infiltration of the liver. 5. Peripherally calcified right renal lesion may represent calcified cyst. Given location peripherally calcified small splenic artery aneurysm is considered, size is stable at between 1.3 and 1.5 cm. Consider follow-up within 3-6 months with renal protocol CT. 6. Signs of ventral abdominal wall reconstruction with laxity and diastasis of rectus musculature, incompletely imaged   Abdominal MRI with  contrast and  MRCP 03/11/2019: 1. Signs of choledocholithiasis confirmed with MRCP. 2. Sludge and stones in the gallbladder largest approximately 1.3 cm. 3. 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 For clarification as suggested a three-month follow-up may be helpful utilizing renal protocol CT. Addendum to report: Biliary duct, common bile duct and intrahepatic ducts with mild ductal distension, perhaps slightly increased from the previous imaging study approximately 7 mm for the extrahepatic biliary tree, common bile duct as compared to 5-6 mm on the previous study.   Past Medical History:  Diagnosis Date  . Anxiety   . Benign essential hypertension   . Blood transfusion without reported diagnosis   . Diabetes mellitus without complication ([place])   . Diverticulitis of colon (without mention of hemorrhage)(562.11)   . Goiter, specified as simple   . Hyperlipidemia LDL goal < 100   . Numbness    TOES RT FOOT and toes left foot  . Obesity, unspecified   . Osteoarthrosis, unspecified whether generalized or localized, lower leg   . Osteopenia   . Other specified disease of nail    FUNGUS OF FINGERNAILS  . Pernicious anemia   . Unspecified glaucoma(365.9)   . Unspecified vitamin D deficiency    Past Surgical History:  Procedure Laterality Date  . BACK SURGERY    . CATARACT EXTRACTION     right eye 06/20/15 left eye 09/12/15  . COLECTOMY  2009  . JOINT REPLACEMENT     right  . LUMBAR LAMINECTOMY/DECOMPRESSION MICRODISCECTOMY N/A 03/30/2013   Procedure: CENTRAL DECOMPRESSION L4 - L5 AND EXCISION 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;  Surgeon: Tobi Bastos, MD;  Location: [facility];  Service: Orthopedics;  Laterality: N/A;  . TOTAL KNEE ARTHROPLASTY Right 02/01/2013   Procedure: RIGHT TOTAL KNEE ARTHROPLASTY;  Surgeon: Tobi Bastos, MD;   Location: [facility];  Service: Orthopedics;  Laterality: Right;  . TOTAL KNEE ARTHROPLASTY Left 04/18/2014   Procedure: LEFT TOTAL KNEE ARTHROPLASTY;  Surgeon: Latanya Maudlin, MD;  Location: [facility];  Service: Orthopedics;  Laterality: Left;   Current Outpatient Medications on File Prior to Visit  Medication Sig Dispense Refill  . Acetaminophen (TYLENOL EXTRA STRENGTH PO) Take 2 tablets by mouth 2 (two) times daily as needed.    Marland Kitchen aspirin EC 81 MG tablet Take 81 mg by mouth daily.    . Calcium Carbonate-Vitamin D (OYSTER SHELL CALCIUM/D) 250-125 MG-UNIT TABS Take 1 tablet by mouth.    . carboxymethylcellulose (REFRESH PLUS) 0.5 % SOLN Place 1 drop into both eyes 3 (three) times daily as needed (dry eyes).     . Cholecalciferol (VITAMIN D-3) 1000 UNITS CAPS Take by mouth daily.    . COMBIGAN 0.2-0.5 % ophthalmic solution INT 1 GTT INTO OU BID  3  . gabapentin (NEURONTIN) 300 MG capsule Take 1 capsule (300 mg total) by mouth at bedtime. 90 capsule 3  . latanoprost (XALATAN) 0.005 % ophthalmic solution Place 1 drop into both eyes at bedtime.    Marland Kitchen lisinopril (ZESTRIL) 5 MG  tablet Take 1 tablet by mouth once daily 90 tablet 3  . meloxicam (MOBIC) 15 MG tablet meloxicam 15 mg tablet  Take 1 tablet every day by oral route for 30 days.    . metFORMIN (GLUCOPHAGE) 500 MG tablet TAKE 1/2 (ONE-HALF) TABLET BY MOUTH IN THE EVENING WITH SUPPER 45 tablet 1  . Multiple Vitamin (MULTIVITAMIN) tablet Take 1 tablet by mouth daily.    Marland Kitchen omega-3 acid ethyl esters (LOVAZA) 1 g capsule Take 1 g by mouth daily.     No current facility-administered medications on file prior to visit.   Allergies  Allergen Reactions  . Aspirin Nausea Only    Can take 81 mg but can't take '325mg'$ , hard on stomach.    Current Medications, Allergies, Past Medical History, Past Surgical History, Family History and Social History were reviewed in [employer] record.   Physical Exam: BP (!) 136/94   Pulse 76    Temp 97.9 F (36.6 C)   Ht '5\' 5"'$  (1.651 m)   Wt 212 lb (96.2 kg)   BMI 35.28 kg/m  General: Well developed 74 year old female in no acute distress, she utilizes a cane with ambulation.  Head: Normocephalic and atraumatic. Eyes:  No scleral icterus. Conjunctiva pink . Ears: Normal auditory acuity. Mouth: Absent upper dentition, poor lower dentition. Brown coating on tongue.  Lungs: Clear throughout to auscultation. Heart: Regular rate and rhythm, no murmur. Abdomen: Soft, nontender and nondistended. No masses or hepatomegaly. Normal bowel sounds x 4 quadrants.  Rectal: Deferred.  Musculoskeletal: Symmetrical with no gross deformities. Extremities: No edema. Neurological: Alert oriented x 4. No focal deficits.  Psychological:  Alert and cooperative. Normal mood and affect  Assessment and Recommendations:  1. 1 your old female with elevated LFTs and alk phos levels consistent with cholestasis with cholelithiasis, choledocholithiasis and biliary, CBD and intra hepatic duct distension.  -ERCP with Dr Fuller Plan. ERCP benefits and risks discussed including risk with sedation, risk of bleeding, perforation, infection, pancreatitis.  -Hepatic panel, BMP, PT/INR, ANA, SMA and AMA -She will need general surgery consult after ERCP, further recommendations per Dr. Fuller Plan -She is aware to go to the local emergency room if she develops chest pain, upper back pain, severe upper abdominal pain, N/V or fever.   2. S/P right colon resection secondary to diverticular disease   3. History of colon polyps, next colonoscopy due 02/2023

## 2019-03-14 NOTE — Progress Notes (Signed)
Reviewed and agree with management plan.  Americo Vallery T. Jenniferann Stuckert, MD FACG [facility]  

## 2019-03-15 ENCOUNTER — Encounter (HOSPITAL_COMMUNITY): Payer: Self-pay | Admitting: Gastroenterology

## 2019-03-15 ENCOUNTER — Other Ambulatory Visit: Payer: Self-pay

## 2019-03-15 ENCOUNTER — Other Ambulatory Visit (HOSPITAL_COMMUNITY)
Admission: RE | Admit: 2019-03-15 | Discharge: 2019-03-15 | Disposition: A | Discharge status: Home / Self Care | Payer: Medicare Other | Source: Ambulatory Visit | Attending: Gastroenterology | Admitting: Gastroenterology

## 2019-03-15 ENCOUNTER — Ambulatory Visit: Payer: Medicare Other | Attending: Internal Medicine | Admitting: Physical Therapy

## 2019-03-15 DIAGNOSIS — G8929 Other chronic pain: Secondary | ICD-10-CM | POA: Insufficient documentation

## 2019-03-15 DIAGNOSIS — R209 Unspecified disturbances of skin sensation: Secondary | ICD-10-CM | POA: Insufficient documentation

## 2019-03-15 DIAGNOSIS — Z01812 Encounter for preprocedural laboratory examination: Secondary | ICD-10-CM | POA: Insufficient documentation

## 2019-03-15 DIAGNOSIS — M25561 Pain in right knee: Secondary | ICD-10-CM | POA: Insufficient documentation

## 2019-03-15 DIAGNOSIS — Z20822 Contact with and (suspected) exposure to covid-19: Secondary | ICD-10-CM | POA: Insufficient documentation

## 2019-03-15 DIAGNOSIS — M25562 Pain in left knee: Secondary | ICD-10-CM | POA: Insufficient documentation

## 2019-03-15 DIAGNOSIS — R2681 Unsteadiness on feet: Secondary | ICD-10-CM | POA: Diagnosis not present

## 2019-03-15 DIAGNOSIS — R262 Difficulty in walking, not elsewhere classified: Secondary | ICD-10-CM | POA: Insufficient documentation

## 2019-03-15 DIAGNOSIS — R208 Other disturbances of skin sensation: Secondary | ICD-10-CM

## 2019-03-15 LAB — SARS CORONAVIRUS 2 (TAT 6-24 HRS): SARS Coronavirus 2: NEGATIVE

## 2019-03-15 NOTE — Progress Notes (Signed)
Pt denies SOB, chest pain, and being under the care of a cardiologist. Pt stated that PCP is Dr. Hollace Kinnier. Pt denies having a stress test, echo and cardiac cath. Pt denies having an EKG in the last year. Pt made aware to stop taking Aspirin (unless otherwise advised by surgeon), vitamins, fish oil and herbal medications. Do not take any NSAIDs ie: Mobic, Ibuprofen, Advil, Naproxen (Aleve), Motrin, BC and Goody Powder. Pt made aware to not take Metformin the morning of surgery. Pt stated that she does not check her CBG. Pt reminded to quarantine. Pt verbalized understanding of all pre-op instructions.

## 2019-03-15 NOTE — Therapy (Signed)
[facility] [address] [place], [place], [address] Phone: [phone]   Fax:  [phone]  Physical Therapy Treatment  Patient Details  Name: Alison [name] MRN: [id number] Date of Birth: February 06, 1945 Referring Provider (PT): Gayland Curry, DO   Encounter Date: 03/15/2019  PT End of Session - 03/15/19 1117    Visit Number  2    Number of Visits  11    Date for PT Re-Evaluation  05/03/19    Authorization Type  Medicare Primary/[facility]I Secondary - 10th visit PN    PT Start Time  0940    PT Stop Time  1018    PT Time Calculation (min)  38 min    Activity Tolerance  Patient tolerated treatment well    Behavior During Therapy  [facility] for tasks assessed/performed       Past Medical History:  Diagnosis Date  . Anxiety   . Benign essential hypertension   . Blood transfusion without reported diagnosis   . Diabetes mellitus without complication ([place])   . Diverticulitis of colon (without mention of hemorrhage)(562.11)   . Goiter, specified as simple   . Hyperlipidemia LDL goal < 100   . Numbness    TOES RT FOOT and toes left foot  . Obesity, unspecified   . Osteoarthrosis, unspecified whether generalized or localized, lower leg   . Osteopenia   . Other specified disease of nail    FUNGUS OF FINGERNAILS  . Pernicious anemia   . Unspecified glaucoma(365.9)   . Unspecified vitamin D deficiency     Past Surgical History:  Procedure Laterality Date  . BACK SURGERY    . CATARACT EXTRACTION     right eye 06/20/15 left eye 09/12/15  . COLECTOMY  2009  . COLONOSCOPY  09/06/2007   Dr Carol Ada  . JOINT REPLACEMENT     right  . LUMBAR LAMINECTOMY/DECOMPRESSION MICRODISCECTOMY N/A 03/30/2013   Procedure: CENTRAL DECOMPRESSION L4 - L5 AND EXCISION 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;  Surgeon: Tobi Bastos, MD;  Location: [facility];  Service: Orthopedics;  Laterality: N/A;  . SIGMOIDOSCOPY  02/16/2018   Dr Fuller Plan  . TOTAL KNEE  ARTHROPLASTY Right 02/01/2013   Procedure: RIGHT TOTAL KNEE ARTHROPLASTY;  Surgeon: Tobi Bastos, MD;  Location: [facility];  Service: Orthopedics;  Laterality: Right;  . TOTAL KNEE ARTHROPLASTY Left 04/18/2014   Procedure: LEFT TOTAL KNEE ARTHROPLASTY;  Surgeon: Latanya Maudlin, MD;  Location: [facility];  Service: Orthopedics;  Laterality: Left;    There were no vitals filed for this visit.  Subjective Assessment - 03/15/19 0948    Subjective  Burning in LE is better.  Still having pain in knees.  Since evaluation pt's father passed away and was unable to attend one appointment due to weather.  Is scheduled to have procedure on Friday to address blocked bile and liver ducts.    Pertinent History  anxiety, HTN, DM, HLD, diverticulitis, obesity, OA with bilat TKA, osteopenia, glaucoma, colectomy, lumbar laminectomy/decompression and cataract extraction    Currently in Pain?  Yes    Pain Location  Knee    Pain Orientation  Right;Left         [facility] PT Assessment - 03/15/19 0949      Standardized Balance Assessment   Standardized Balance Assessment  Dynamic Gait Index      Dynamic Gait Index   Level Surface  Mild Impairment    Change in Gait Speed  Mild Impairment  Gait with Horizontal Head Turns  Moderate Impairment    Gait with Vertical Head Turns  Mild Impairment    Gait and Pivot Turn  Mild Impairment    Step Over Obstacle  Severe Impairment    Step Around Obstacles  Mild Impairment    Steps  Moderate Impairment    Total Score  12    DGI comment:  12/24        Access Code: [email or web address] URL: [email or web address] Date: 03/15/2019 Prepared by: Misty Stanley  Exercises Sit to Stand with Counter Support - 5 reps - 2 sets - 1x daily - 7x weekly Standing Marching - 5 reps - 1 sets - 3 seconds hold - 1x daily - 7x weekly Standing Hip Abduction with Counter Support - 10 reps - 2 sets - 1x daily - 7x weekly Standing Hip Extension with Counter Support - 10 reps - 2 sets - 1x  daily - 7x weekly Narrow Stance with Counter Support - 3 sets - 10 second hold - 1x daily - 7x weekly     PT Education - 03/15/19 1116    Education Details  clinical results of DGI, initiated HEP    Person(s) Educated  Patient    Methods  Explanation;Demonstration;Handout    Comprehension  Verbalized understanding;Returned demonstration       PT Short Term Goals - 02/22/19 1339      PT SHORT TERM GOAL #1   Title  Pt will demonstrate independence with initial HEP and walking program for aerobic conditioning    Time  5    Period  Weeks    Status  New    Target Date  03/29/19      PT SHORT TERM GOAL #2   Title  Pt will decrease time to perform five time sit to stand to </= 17 seconds    Baseline  20 seconds with UE support    Time  5    Period  Weeks    Status  New    Target Date  03/29/19      PT SHORT TERM GOAL #3   Title  Pt will increase BERG score to >/= 38/56 to indicate decreased falls risk    Baseline  34/56    Time  5    Period  Weeks    Status  New    Target Date  03/29/19      PT SHORT TERM GOAL #4   Title  Pt will participate in further gait assessment with DGI/stair negotiation assessment    Time  5    Period  Weeks    Status  New    Target Date  03/29/19      PT SHORT TERM GOAL #5   Title  Pt will ambulate x 115' over level indoor surfaces and negotiate around furniture to simulate home environment with cane MOD I without touching walls/furniture for extra support    Time  5    Period  Weeks    Status  New    Target Date  03/29/19        PT Long Term Goals - 02/22/19 1344      PT LONG TERM GOAL #1   Title  Pt will demonstrate independence with final LE strength/balance HEP, biking program and community wellness plan    Time  10    Period  Weeks    Status  New    Target Date  05/03/19      PT LONG TERM  GOAL #2   Title  Pt will decrease five time sit to stand to </= 15 seconds from chair    Time  10    Period  Weeks    Status  New     Target Date  05/03/19      PT LONG TERM GOAL #3   Title  Pt will increase BERG to >/= 42/56 to indicate decreased falls risk    Time  10    Period  Weeks    Status  New    Target Date  05/03/19      PT LONG TERM GOAL #4   Title  Pt will increase DGI to >/= 18/24 with cane to indicate decreased risk for falls in community    Baseline  TBD    Time  10    Period  Weeks    Status  New    Target Date  05/03/19      PT LONG TERM GOAL #5   Title  Pt will ambulate x 200' outside over paved surfaces and negotiate curb with cane with supervision    Time  10    Period  Weeks    Status  New    Target Date  05/03/19            Plan - 03/15/19 1118    Clinical Impression Statement  Completed assessment of patient balance and falls risk during ambulation with DGI with pt demonstrating high falls risk.  Initiated standing balance and LE strengthening HEP.  Pt required UE support during exercises and intermittent seated rest breaks due to LE fatigue.  Will continue to address in order to progress towards LTG.    Personal Factors and Comorbidities  Comorbidity 3+;Fitness;Social Background;Transportation    Comorbidities  anxiety, HTN, DM, HLD, diverticulitis, obesity, OA with bilat TKA, osteopenia, glaucoma, colectomy, lumbar laminectomy/decompression and cataract extraction    Examination-Activity Limitations  Caring for Others;Locomotion Level;Stairs;Stand    Examination-Participation Restrictions  Cleaning;Community Activity;Laundry;Meal Prep    Stability/Clinical Decision Making  Evolving/Moderate complexity    Rehab Potential  Good    PT Frequency  1x / week    PT Duration  Other (comment)   10 weeks   PT Treatment/Interventions  ADLs/Self Care Home Management;Aquatic Therapy;Electrical Stimulation;Cryotherapy;Moist Heat;DME Instruction;Gait training;Stair training;Functional mobility training;Therapeutic activities;Therapeutic exercise;Balance training;Neuromuscular  re-education;Patient/family education;Orthotic Fit/Training;Passive range of motion;Dry needling;Taping    PT Next Visit Plan  How is HEP?  Progress standing balance HEP -  LE stretching, strengthening, sit <> stand, standing balance with narrow BOS, compliant surface, eyes closed, SLS training    Consulted and Agree with Plan of Care  Patient       Patient will benefit from skilled therapeutic intervention in order to improve the following deficits and impairments:  Decreased balance, Decreased activity tolerance, Decreased strength, Difficulty walking, Impaired sensation, Pain  Visit Diagnosis: Other disturbances of skin sensation  Unsteadiness on feet  Difficulty in walking, not elsewhere classified  Chronic pain of right knee  Chronic pain of left knee     Problem List Patient Active Problem List   Diagnosis Date Noted  . Renal calcification 03/10/2019  . Tobacco use disorder, continuous 03/10/2019  . Transaminitis 02/04/2019  . Abnormal ultrasound of liver 02/04/2019  . Sessile rectal polyp 05/02/2018  . Internal hemorrhoids without complication [id number]  . Diverticulosis 05/02/2018  . 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 Chronic pain of both knees 08/19/2017  . Cataracts, bilateral 07/08/2015  . Hyperlipidemia  associated with type 2 diabetes mellitus ([place]) 04/27/2015  . History of total knee arthroplasty 04/18/2014  . History of lumbar laminectomy for spinal cord decompression 05/04/2013  . Synovial cyst of lumbar spine 03/31/2013  . Spinal stenosis of lumbar region with neurogenic claudication 03/30/2013  . Synovial cyst of lumbar facet joint 03/30/2013  . Type 2 diabetes mellitus with neurological complications ([place]) [id number]  . Osteoarthritis of right knee 02/01/2013  . History of total knee replacement 02/01/2013  . Tobacco abuse 10/03/2012  . Hyperglycemia 10/03/2012  . Unspecified vitamin D deficiency   . Class 2 severe obesity due  to excess calories with serious comorbidity and body mass index (BMI) of 35.0 to 35.9 in adult [facility]   . Pernicious anemia   . Glaucoma (increased eye pressure)   . Benign essential hypertension   . Osteopenia     Rico Junker, PT, DPT 03/15/19    11:27 AM    [facility] [address] [place], [place], [address] Phone: [phone]   Fax:  [phone]  Name: Essense Flournoy MRN: [id number] Date of Birth: [birth year]

## 2019-03-15 NOTE — Patient Instructions (Signed)
Access Code: [email or web address] URL: [email or web address] Date: 03/15/2019 Prepared by: Misty Stanley  Exercises Sit to Stand with Counter Support - 5 reps - 2 sets - 1x daily - 7x weekly Standing Marching - 5 reps - 1 sets - 3 seconds hold - 1x daily - 7x weekly Standing Hip Abduction with Counter Support - 10 reps - 2 sets - 1x daily - 7x weekly Standing Hip Extension with Counter Support - 10 reps - 2 sets - 1x daily - 7x weekly Narrow Stance with Counter Support - 3 sets - 10 second hold - 1x daily - 7x weekly

## 2019-03-16 LAB — MITOCHONDRIAL ANTIBODIES: Mitochondrial Ab: <20 Units (ref 0.0–20.0)

## 2019-03-16 LAB — ANA: Anti Nuclear Antibody (ANA): NEGATIVE

## 2019-03-16 LAB — ANTI-SMOOTH MUSCLE ANTIBODY, IGG: Smooth Muscle Ab: 185 Units — ABNORMAL HIGH (ref 0–19)

## 2019-03-16 NOTE — Anesthesia Preprocedure Evaluation (Addendum)
Anesthesia Evaluation  Patient identified by MRN, date of birth, ID band Patient awake    Reviewed: Allergy & Precautions, NPO status , Patient's Chart, lab work & pertinent test results  Airway Mallampati: II  TM Distance: >3 FB Neck ROM: Full    Dental no notable dental hx. (+) Poor Dentition, Edentulous Upper, Dental Advisory Given,    Pulmonary Current Smoker and Patient abstained from smoking.,    Pulmonary exam normal breath sounds clear to auscultation       Cardiovascular hypertension, Pt. on medications Normal cardiovascular exam Rhythm:Regular Rate:Normal     Neuro/Psych negative neurological ROS  negative psych ROS   GI/Hepatic   Endo/Other  diabetes, Type 2  Renal/GU Renal InsufficiencyRenal diseaseK+ 4.1 Cr 0.83     Musculoskeletal negative musculoskeletal ROS (+)   Abdominal   Peds  Hematology  (+) anemia , Hx of pernicious anemia Hgb 13.0   Anesthesia Other Findings   Reproductive/Obstetrics                            Anesthesia Physical Anesthesia Plan  ASA: III  Anesthesia Plan: General   Post-op Pain Management:    Induction: Intravenous  PONV Risk Score and Plan: Treatment may vary due to age or medical condition, Ondansetron and Dexamethasone  Airway Management Planned: Oral ETT  Additional Equipment: None  Intra-op Plan:   Post-operative Plan: Extubation in OR  Informed Consent:     Dental advisory given  Plan Discussed with:   Anesthesia Plan Comments: (No nitrous)        Anesthesia Quick Evaluation

## 2019-03-17 ENCOUNTER — Ambulatory Visit (HOSPITAL_COMMUNITY): Payer: Medicare Other | Admitting: Anesthesiology

## 2019-03-17 ENCOUNTER — Ambulatory Visit (HOSPITAL_COMMUNITY): Payer: Medicare Other

## 2019-03-17 ENCOUNTER — Ambulatory Visit (HOSPITAL_COMMUNITY)
Admission: RE | Admit: 2019-03-17 | Discharge: 2019-03-17 | Disposition: A | Discharge status: Home / Self Care | Payer: Medicare Other | Attending: Gastroenterology | Admitting: Gastroenterology

## 2019-03-17 ENCOUNTER — Encounter (HOSPITAL_COMMUNITY)
Admission: RE | Disposition: A | Discharge status: Home / Self Care | Payer: Self-pay | Source: Home / Self Care | Attending: Gastroenterology

## 2019-03-17 ENCOUNTER — Ambulatory Visit: Payer: Self-pay | Admitting: Urology

## 2019-03-17 ENCOUNTER — Encounter (HOSPITAL_COMMUNITY): Payer: Self-pay | Admitting: Gastroenterology

## 2019-03-17 ENCOUNTER — Other Ambulatory Visit: Payer: Self-pay

## 2019-03-17 DIAGNOSIS — D510 Vitamin B12 deficiency anemia due to intrinsic factor deficiency: Secondary | ICD-10-CM | POA: Insufficient documentation

## 2019-03-17 DIAGNOSIS — K8051 Calculus of bile duct without cholangitis or cholecystitis with obstruction: Secondary | ICD-10-CM

## 2019-03-17 DIAGNOSIS — Z7984 Long term (current) use of oral hypoglycemic drugs: Secondary | ICD-10-CM | POA: Insufficient documentation

## 2019-03-17 DIAGNOSIS — Z886 Allergy status to analgesic agent status: Secondary | ICD-10-CM | POA: Insufficient documentation

## 2019-03-17 DIAGNOSIS — M1990 Unspecified osteoarthritis, unspecified site: Secondary | ICD-10-CM | POA: Insufficient documentation

## 2019-03-17 DIAGNOSIS — Z791 Long term (current) use of non-steroidal anti-inflammatories (NSAID): Secondary | ICD-10-CM | POA: Diagnosis not present

## 2019-03-17 DIAGNOSIS — I10 Essential (primary) hypertension: Secondary | ICD-10-CM | POA: Insufficient documentation

## 2019-03-17 DIAGNOSIS — M8580 Other specified disorders of bone density and structure, unspecified site: Secondary | ICD-10-CM | POA: Diagnosis not present

## 2019-03-17 DIAGNOSIS — F419 Anxiety disorder, unspecified: Secondary | ICD-10-CM | POA: Diagnosis not present

## 2019-03-17 DIAGNOSIS — Z79899 Other long term (current) drug therapy: Secondary | ICD-10-CM | POA: Diagnosis not present

## 2019-03-17 DIAGNOSIS — Z9842 Cataract extraction status, left eye: Secondary | ICD-10-CM | POA: Insufficient documentation

## 2019-03-17 DIAGNOSIS — Z86010 Personal history of colonic polyps: Secondary | ICD-10-CM | POA: Diagnosis not present

## 2019-03-17 DIAGNOSIS — H409 Unspecified glaucoma: Secondary | ICD-10-CM | POA: Diagnosis not present

## 2019-03-17 DIAGNOSIS — Z9049 Acquired absence of other specified parts of digestive tract: Secondary | ICD-10-CM | POA: Insufficient documentation

## 2019-03-17 DIAGNOSIS — Z9841 Cataract extraction status, right eye: Secondary | ICD-10-CM | POA: Insufficient documentation

## 2019-03-17 DIAGNOSIS — Z7982 Long term (current) use of aspirin: Secondary | ICD-10-CM | POA: Insufficient documentation

## 2019-03-17 DIAGNOSIS — Z96653 Presence of artificial knee joint, bilateral: Secondary | ICD-10-CM | POA: Diagnosis not present

## 2019-03-17 DIAGNOSIS — E1165 Type 2 diabetes mellitus with hyperglycemia: Secondary | ICD-10-CM | POA: Diagnosis not present

## 2019-03-17 DIAGNOSIS — E785 Hyperlipidemia, unspecified: Secondary | ICD-10-CM | POA: Insufficient documentation

## 2019-03-17 DIAGNOSIS — F17200 Nicotine dependence, unspecified, uncomplicated: Secondary | ICD-10-CM | POA: Insufficient documentation

## 2019-03-17 DIAGNOSIS — R748 Abnormal levels of other serum enzymes: Secondary | ICD-10-CM | POA: Insufficient documentation

## 2019-03-17 DIAGNOSIS — Z6835 Body mass index (BMI) 35.0-35.9, adult: Secondary | ICD-10-CM | POA: Diagnosis not present

## 2019-03-17 DIAGNOSIS — E119 Type 2 diabetes mellitus without complications: Secondary | ICD-10-CM | POA: Insufficient documentation

## 2019-03-17 DIAGNOSIS — Z8719 Personal history of other diseases of the digestive system: Secondary | ICD-10-CM | POA: Diagnosis not present

## 2019-03-17 DIAGNOSIS — E559 Vitamin D deficiency, unspecified: Secondary | ICD-10-CM | POA: Diagnosis not present

## 2019-03-17 DIAGNOSIS — K831 Obstruction of bile duct: Secondary | ICD-10-CM

## 2019-03-17 DIAGNOSIS — E669 Obesity, unspecified: Secondary | ICD-10-CM | POA: Diagnosis not present

## 2019-03-17 HISTORY — DX: Presence of dental prosthetic device (complete) (partial): Z97.2

## 2019-03-17 HISTORY — DX: Presence of spectacles and contact lenses: Z97.3

## 2019-03-17 HISTORY — DX: Other specified diseases of biliary tract: K83.8

## 2019-03-17 HISTORY — PX: REMOVAL OF STONES: SHX5545

## 2019-03-17 HISTORY — PX: SPHINCTEROTOMY: SHX5544

## 2019-03-17 HISTORY — PX: ENDOSCOPIC RETROGRADE CHOLANGIOPANCREATOGRAPHY (ERCP) WITH PROPOFOL: SHX5810

## 2019-03-17 LAB — GLUCOSE, CAPILLARY: Glucose-Capillary: 112 mg/dL — ABNORMAL HIGH (ref 70–99)

## 2019-03-17 SURGERY — ENDOSCOPIC RETROGRADE CHOLANGIOPANCREATOGRAPHY (ERCP) WITH PROPOFOL
Anesthesia: General

## 2019-03-17 MED ORDER — PHENYLEPHRINE HCL (PRESSORS) 10 MG/ML IV SOLN
INTRAVENOUS | Status: DC | PRN
  Administered 2019-03-17 (×3): 120 ug via INTRAVENOUS
  Administered 2019-03-17: 80 ug via INTRAVENOUS

## 2019-03-17 MED ORDER — INDOMETHACIN 50 MG RE SUPP
100.0000 mg | Freq: Once | RECTAL | Status: DC
  Filled 2019-03-17: qty 2

## 2019-03-17 MED ORDER — GLUCAGON HCL RDNA (DIAGNOSTIC) 1 MG IJ SOLR
INTRAMUSCULAR | Status: DC | PRN
  Administered 2019-03-17: .25 mg via INTRAVENOUS

## 2019-03-17 MED ORDER — SODIUM CHLORIDE 0.9 % IV SOLN: INTRAVENOUS | Status: DC

## 2019-03-17 MED ORDER — SUCCINYLCHOLINE CHLORIDE 20 MG/ML IJ SOLN
INTRAMUSCULAR | Status: DC | PRN
  Administered 2019-03-17: 100 mg via INTRAVENOUS

## 2019-03-17 MED ORDER — INDOMETHACIN 50 MG RE SUPP
100.0000 mg | RECTAL | Status: DC
  Filled 2019-03-17: qty 2

## 2019-03-17 MED ORDER — ROCURONIUM BROMIDE 100 MG/10ML IV SOLN
INTRAVENOUS | Status: DC | PRN
  Administered 2019-03-17: 40 mg via INTRAVENOUS

## 2019-03-17 MED ORDER — SODIUM CHLORIDE 0.9 % IV SOLN
1.5000 g | INTRAVENOUS | Status: AC
  Administered 2019-03-17: 1.5 g via INTRAVENOUS

## 2019-03-17 MED ORDER — GLUCAGON HCL RDNA (DIAGNOSTIC) 1 MG IJ SOLR
INTRAMUSCULAR | Status: AC
  Filled 2019-03-17: qty 1

## 2019-03-17 MED ORDER — LIDOCAINE HCL (CARDIAC) PF 100 MG/5ML IV SOSY
PREFILLED_SYRINGE | INTRAVENOUS | Status: DC | PRN
  Administered 2019-03-17: 60 mg via INTRAVENOUS

## 2019-03-17 MED ORDER — FENTANYL CITRATE (PF) 100 MCG/2ML IJ SOLN
INTRAMUSCULAR | Status: DC | PRN
  Administered 2019-03-17 (×2): 50 ug via INTRAVENOUS

## 2019-03-17 MED ORDER — INDOMETHACIN 50 MG RE SUPP
RECTAL | Status: DC | PRN
  Administered 2019-03-17: 100 mg via RECTAL

## 2019-03-17 MED ORDER — SUGAMMADEX SODIUM 200 MG/2ML IV SOLN
INTRAVENOUS | Status: DC | PRN
  Administered 2019-03-17: 200 mg via INTRAVENOUS

## 2019-03-17 MED ORDER — SODIUM CHLORIDE 0.9 % IV SOLN
INTRAVENOUS | Status: DC | PRN
  Administered 2019-03-17: 50 mL

## 2019-03-17 MED ORDER — EPHEDRINE SULFATE 50 MG/ML IJ SOLN
INTRAMUSCULAR | Status: DC | PRN
  Administered 2019-03-17: 5 mg via INTRAVENOUS

## 2019-03-17 MED ORDER — ONDANSETRON HCL 4 MG/2ML IJ SOLN
INTRAMUSCULAR | Status: DC | PRN
  Administered 2019-03-17: 4 mg via INTRAVENOUS

## 2019-03-17 MED ORDER — LACTATED RINGERS IV SOLN
INTRAVENOUS | Status: AC | PRN
  Administered 2019-03-17: 1000 mL via INTRAVENOUS

## 2019-03-17 MED ORDER — PROPOFOL 10 MG/ML IV BOLUS
INTRAVENOUS | Status: DC | PRN
  Administered 2019-03-17: 100 mg via INTRAVENOUS
  Administered 2019-03-17: 50 mg via INTRAVENOUS

## 2019-03-17 MED ORDER — SODIUM CHLORIDE 0.9 % IV SOLN
1.5000 g | Freq: Once | INTRAVENOUS | Status: DC
  Filled 2019-03-17: qty 4

## 2019-03-17 NOTE — Op Note (Signed)
[facility] Patient Name: Alison [name] Procedure Date : 03/17/2019 MRN: [id number] Attending MD: Ladene Artist , MD Date of Birth: 04/12/1945 CSN: [id number] Age: 74 Admit Type: Outpatient Procedure:                ERCP Indications:              Bile duct stone(s) on MRCP Providers:                Pricilla Riffle. Fuller Plan, MD, Carlyn Reichert, RN, Lazaro Arms, Technician Referring MD:             Rexene Edison. Reed, DO Medicines:                General Anesthesia Complications:            No immediate complications. Estimated Blood Loss:     Estimated blood loss: none. Procedure:                Pre-Anesthesia Assessment:                           - Prior to the procedure, a History and Physical                            was performed, and patient medications and                            allergies were reviewed. The patient's tolerance of                            previous anesthesia was also reviewed. The risks                            and benefits of the procedure and the sedation                            options and risks were discussed with the patient.                            All questions were answered, and informed consent                            was obtained. Prior Anticoagulants: The patient has                            taken no previous anticoagulant or antiplatelet                            agents. ASA Grade Assessment: II - A patient with                            mild systemic disease. After reviewing the risks  and benefits, the patient was deemed in                            satisfactory condition to undergo the procedure.                           After obtaining informed consent, the scope was                            passed under direct vision. Throughout the                            procedure, the patient's blood pressure, pulse, and                            oxygen saturations were  monitored continuously.                            Mild resistance attempting to intubate the                            esophagus with the duodenoscope so changed to an                            upper endoscope [id number] The esophagus                            was easliy intubated with a normal pharynx and a                            normal esophagus noted. The [id number]                            Olympus Duodensocope was introduced through the                            mouth, and used to inject contrast into and used to                            inject contrast into the bile duct. The ERCP was                            accomplished without difficulty. The patient                            tolerated the procedure well. Scope In: Scope Out: Findings:      The scout film was normal. The esophagus was successfully intubated       under direct vision. The scope was advanced to a normal major papilla in       the descending duodenum without detailed examination of the pharynx,       larynx and associated structures, and upper GI tract. The upper GI tract       was grossly normal. A straight Roadrunner wire was passed into  the       biliary tree. The short-nosed traction sphincterotome was passed over       the guidewire and the bile duct was then deeply cannulated. Contrast was       injected. I personally interpreted the bile duct images. There was       appropriate flow of contrast through the ducts. The common bile duct was       diffusely dilated, with a stone causing an obstruction. The largest       diameter was 9 mm. The common bile duct contained one stone, which was 7       mm in diameter. A 7 mm biliary sphincterotomy was made with a traction       (standard) sphincterotome using ERBE electrocautery. The cystic duct and       gallbladder were partially filled. The intrahepatic ducts appeared       normal. There was no post-sphincterotomy bleeding. The  biliary tree was       swept three times with a 9 mm balloon starting at the bifurcation. One       stone was removed. No stones remained. Excellent biliary drainage was       then noted. The PD was not cannulated or injected by intention. Impression:               - The common bile duct was dilated, with a stone                            causing an obstruction.                           - Choledocholithiasis was found. Complete removal                            was accomplished by biliary sphincterotomy and                            balloon extraction.                           - A biliary sphincterotomy was performed.                           - The biliary tree was swept. Recommendation:           - Avoid aspirin and nonsteroidal anti-inflammatory                            medicines for 1 week post Indocin today.                           - Clear liquid diet for 2 hours, then advance as                            tolerated to soft diet today.                           - Resume prior diet tomorrow.                           -  Return to GI office in 1 month.                           - Observe patient's clinical course following                            today's ERCP with therapeutic intervention. Procedure Code(s):        --- Professional ---                           [phone], Endoscopic retrograde                            cholangiopancreatography (ERCP); with removal of                            calculi/debris from biliary/pancreatic duct(s)                           43262, Endoscopic retrograde                            cholangiopancreatography (ERCP); with                            sphincterotomy/papillotomy Diagnosis Code(s):        --- Professional ---                           K80.51, Calculus of bile duct without cholangitis                            or cholecystitis with obstruction CPT copyright 2019 American Medical Association. All rights reserved. The codes  documented in this report are preliminary and upon coder review may  be revised to meet current compliance requirements. Ladene Artist, MD 03/17/2019 8:59:02 AM This report has been signed electronically. Number of Addenda: 0

## 2019-03-17 NOTE — Transfer of Care (Signed)
Immediate Anesthesia Transfer of Care Note  Patient: Alison [name]  Procedure(s) Performed: ENDOSCOPIC RETROGRADE CHOLANGIOPANCREATOGRAPHY (ERCP) WITH PROPOFOL (N/A ) SPHINCTEROTOMY REMOVAL OF STONES  Patient Location: PACU and Endoscopy Unit  Anesthesia Type:General  Level of Consciousness: drowsy  Airway & Oxygen Therapy: Patient Spontanous Breathing and Patient connected to nasal cannula oxygen  Post-op Assessment: Report given to RN, Post -op Vital signs reviewed and stable and Patient moving all extremities  Post vital signs: Reviewed and stable  Last Vitals:  Vitals Value Taken Time  BP 129/84 03/17/19 0854  Temp 36.4 C 03/17/19 0854  Pulse 123 03/17/19 0856  Resp 21 03/17/19 0856  SpO2 96 % 03/17/19 0856  Vitals shown include unvalidated device data.  Last Pain:  Vitals:   03/17/19 0854  TempSrc: Temporal  PainSc: 0-No pain         Complications: No apparent anesthesia complications

## 2019-03-17 NOTE — Discharge Instructions (Signed)
YOU HAD AN ENDOSCOPIC PROCEDURE TODAY: Refer to the procedure report and other information in the discharge instructions given to you for any specific questions about what was found during the examination. If this information does not answer your questions, please call [place] office at [phone] to clarify.   YOU SHOULD EXPECT: Some feelings of bloating in the abdomen. Passage of more gas than usual. Walking can help get rid of the air that was put into your GI tract during the procedure and reduce the bloating. If you had a lower endoscopy (such as a colonoscopy or flexible sigmoidoscopy) you may notice spotting of blood in your stool or on the toilet paper. Some abdominal soreness may be present for a day or two, also.  DIET: Your first meal following the procedure should be a light meal and then it is ok to progress to your normal diet. A half-sandwich or bowl of soup is an example of a good first meal. Heavy or fried foods are harder to digest and may make you feel nauseous or bloated. Drink plenty of fluids but you should avoid alcoholic beverages for 24 hours. If you had a esophageal dilation, please see attached instructions for diet.    ACTIVITY: Your care partner should take you home directly after the procedure. You should plan to take it easy, moving slowly for the rest of the day. You can resume normal activity the day after the procedure however YOU SHOULD NOT DRIVE, use power tools, machinery or perform tasks that involve climbing or major physical exertion for 24 hours (because of the sedation medicines used during the test).   SYMPTOMS TO REPORT IMMEDIATELY: A gastroenterologist can be reached at any hour. Please call [phone]  for any of the following symptoms:   Following upper endoscopy (EGD, EUS, ERCP, esophageal dilation) Vomiting of blood or coffee ground material  New, significant abdominal pain  New, significant chest pain or pain under the shoulder blades  Painful or  persistently difficult swallowing  New shortness of breath  Black, tarry-looking or red, bloody stools  FOLLOW UP:  If any biopsies were taken you will be contacted by phone or by letter within the next 1-3 weeks. Call [phone]  if you have not heard about the biopsies in 3 weeks.  Please also call with any specific questions about appointments or follow up tests.  

## 2019-03-17 NOTE — Anesthesia Postprocedure Evaluation (Signed)
Anesthesia Post Note  Patient: Alison [name]  Procedure(s) Performed: ENDOSCOPIC RETROGRADE CHOLANGIOPANCREATOGRAPHY (ERCP) WITH PROPOFOL (N/A ) SPHINCTEROTOMY REMOVAL OF STONES     Patient location during evaluation: PACU Anesthesia Type: General Level of consciousness: awake and alert Pain management: pain level controlled Vital Signs Assessment: post-procedure vital signs reviewed and stable Respiratory status: spontaneous breathing, nonlabored ventilation, respiratory function stable and patient connected to nasal cannula oxygen Cardiovascular status: blood pressure returned to baseline and stable Postop Assessment: no apparent nausea or vomiting Anesthetic complications: no    Last Vitals:  Vitals:   03/17/19 0708 03/17/19 0854  BP: (!) 170/83 129/84  Pulse:  (!) 120  Resp: (!) 27 (!) 25  Temp: 37.2 C 36.4 C  SpO2: 100% 96%    Last Pain:  Vitals:   03/17/19 0854  TempSrc: Temporal  PainSc: 0-No pain                 Barnet Glasgow

## 2019-03-17 NOTE — Anesthesia Procedure Notes (Signed)
Procedure Name: Intubation Date/Time: 03/17/2019 7:59 AM Performed by: Oswin Griffith T, CRNA Pre-anesthesia Checklist: Patient identified, Emergency Drugs available, Suction available and Patient being monitored Patient Re-evaluated:Patient Re-evaluated prior to induction Oxygen Delivery Method: Circle system utilized Preoxygenation: Pre-oxygenation with 100% oxygen Induction Type: IV induction Ventilation: Mask ventilation without difficulty Laryngoscope Size: Mac and 3 Grade View: Grade I Tube type: Oral Tube size: 7.0 mm Number of attempts: 1 Airway Equipment and Method: Stylet and Oral airway Placement Confirmation: ETT inserted through vocal cords under direct vision,  positive ETCO2 and breath sounds checked- equal and bilateral Secured at: 21 cm Tube secured with: Tape Dental Injury: Teeth and Oropharynx as per pre-operative assessment

## 2019-03-17 NOTE — Interval H&P Note (Signed)
History and Physical Interval Note:  03/17/2019 7:46 AM  Alison [name]  has presented today for surgery, with the diagnosis of intrahepatic duct distention and choledocholithiasis.  The various methods of treatment have been discussed with the patient and family. After consideration of risks, benefits and other options for treatment, the patient has consented to  Procedure(s): ENDOSCOPIC RETROGRADE CHOLANGIOPANCREATOGRAPHY (ERCP) WITH PROPOFOL (N/A) as a surgical intervention.  The patient's history has been reviewed, patient examined, no change in status, stable for surgery.  I have reviewed the patient's chart and labs.  Questions were answered to the patient's satisfaction.     Pricilla Riffle. Fuller Plan

## 2019-03-20 ENCOUNTER — Telehealth: Payer: Self-pay | Admitting: Gastroenterology

## 2019-03-20 NOTE — Telephone Encounter (Signed)
Patient's questions about her medications answered.  She is advised to hold all ASA and Non-steroid medications for one week.  She will call back for any additional questions or concerns.

## 2019-03-22 ENCOUNTER — Ambulatory Visit: Payer: Medicare Other | Admitting: Physical Therapy

## 2019-03-22 ENCOUNTER — Encounter: Payer: Self-pay | Admitting: Physical Therapy

## 2019-03-22 ENCOUNTER — Other Ambulatory Visit: Payer: Self-pay

## 2019-03-22 DIAGNOSIS — R208 Other disturbances of skin sensation: Secondary | ICD-10-CM

## 2019-03-22 DIAGNOSIS — R2681 Unsteadiness on feet: Secondary | ICD-10-CM | POA: Diagnosis not present

## 2019-03-22 DIAGNOSIS — G8929 Other chronic pain: Secondary | ICD-10-CM

## 2019-03-22 DIAGNOSIS — M25561 Pain in right knee: Secondary | ICD-10-CM | POA: Diagnosis not present

## 2019-03-22 DIAGNOSIS — M25562 Pain in left knee: Secondary | ICD-10-CM | POA: Diagnosis not present

## 2019-03-22 DIAGNOSIS — R262 Difficulty in walking, not elsewhere classified: Secondary | ICD-10-CM | POA: Diagnosis not present

## 2019-03-22 DIAGNOSIS — R209 Unspecified disturbances of skin sensation: Secondary | ICD-10-CM | POA: Diagnosis not present

## 2019-03-22 NOTE — Therapy (Signed)
[facility] [address] [place], [place], [address] Phone: [phone]   Fax:  [phone]  Physical Therapy Treatment  Patient Details  Name: Alison [name] MRN: [id number] Date of Birth: [birth year] Referring Provider (PT): Gayland Curry, DO   Encounter Date: 03/22/2019  PT End of Session - 03/22/19 1209    Visit Number  3    Number of Visits  11    Date for PT Re-Evaluation  05/03/19    Authorization Type  Medicare Primary/[facility] Secondary - 10th visit PN    Progress Note Due on Visit  10    PT Start Time  0938    PT Stop Time  1017    PT Time Calculation (min)  39 min    Activity Tolerance  Patient limited by pain    Behavior During Therapy  [facility] for tasks assessed/performed       Past Medical History:  Diagnosis Date  . Anxiety   . Benign essential hypertension   . Blood transfusion without reported diagnosis   . Diabetes mellitus without complication ([place])   . Diverticulitis of colon (without mention of hemorrhage)(562.11)   . Goiter, specified as simple   . Hyperlipidemia LDL goal < 100   . Intrahepatic bile duct dilation   . Numbness    TOES RT FOOT and toes left foot  . Obesity, unspecified   . Osteoarthrosis, unspecified whether generalized or localized, lower leg   . Osteopenia   . Other specified disease of nail    FUNGUS OF FINGERNAILS  . Pernicious anemia   . Unspecified glaucoma(365.9)   . Unspecified vitamin D deficiency   . Wears dentures   . Wears glasses     Past Surgical History:  Procedure Laterality Date  . BACK SURGERY    . CATARACT EXTRACTION     right eye 06/20/15 left eye 09/12/15  . COLECTOMY  2009  . COLONOSCOPY  09/06/2007   Dr Carol Ada  . DILATION AND CURETTAGE OF UTERUS    . ENDOSCOPIC RETROGRADE CHOLANGIOPANCREATOGRAPHY (ERCP) WITH PROPOFOL N/A 03/17/2019   Procedure: ENDOSCOPIC RETROGRADE CHOLANGIOPANCREATOGRAPHY (ERCP) WITH PROPOFOL;  Surgeon: Ladene Artist,  MD;  Location: [facility] ENDOSCOPY;  Service: Endoscopy;  Laterality: N/A;  . JOINT REPLACEMENT     right  . LUMBAR LAMINECTOMY/DECOMPRESSION MICRODISCECTOMY N/A 03/30/2013   Procedure: CENTRAL DECOMPRESSION L4 - L5 AND EXCISION 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;  Surgeon: Tobi Bastos, MD;  Location: [facility] ORS;  Service: Orthopedics;  Laterality: N/A;  . MULTIPLE TOOTH EXTRACTIONS    . REMOVAL OF STONES  03/17/2019   Procedure: REMOVAL OF STONES;  Surgeon: Ladene Artist, MD;  Location: [facility] ENDOSCOPY;  Service: Endoscopy;;  . SIGMOIDOSCOPY  02/16/2018   Dr Fuller Plan  . SPHINCTEROTOMY  03/17/2019   Procedure: SPHINCTEROTOMY;  Surgeon: Ladene Artist, MD;  Location: [facility];  Service: Endoscopy;;  . TOTAL KNEE ARTHROPLASTY Right 02/01/2013   Procedure: RIGHT TOTAL KNEE ARTHROPLASTY;  Surgeon: Tobi Bastos, MD;  Location: [facility];  Service: Orthopedics;  Laterality: Right;  . TOTAL KNEE ARTHROPLASTY Left 04/18/2014   Procedure: LEFT TOTAL KNEE ARTHROPLASTY;  Surgeon: Latanya Maudlin, MD;  Location: [facility];  Service: Orthopedics;  Laterality: Left;    There were no vitals filed for this visit.  Subjective Assessment - 03/22/19 0942    Subjective  Bile duct procedure was "rough" - afterwards throat has been painful, has a cough and abdoment has been sore.  Feeling a  little better today.  Still did HEP, needs to do ABD, extension and SLS more.    Pertinent History  anxiety, HTN, DM, HLD, diverticulitis, obesity, OA with bilat TKA, osteopenia, glaucoma, colectomy, lumbar laminectomy/decompression and cataract extraction    Patient Stated Goals  Help the pain and help the balance    Currently in Pain?  Yes                       [facility] PT Treatment/Exercise - 03/22/19 0944      Ambulation/Gait   Ambulation/Gait  Yes    Ambulation/Gait Assistance  6: Modified independent (Device/Increase time)    Ambulation Distance (Feet)  115 Feet    Assistive device  Straight cane    Gait Pattern   Step-through pattern;Trendelenburg;Antalgic    Ambulation Surface  Level;Indoor      Therapeutic Activites    Therapeutic Activities  Other Therapeutic Activities    Other Therapeutic Activities  Educated pt on how to perform bed mobility and rolling hugging a pillow to abdomen due to ongoing soreness after procedure      Exercises   Exercises  Knee/Hip      Knee/Hip Exercises: Aerobic   Nustep  at level 6 resistance with bilat UE and LE x 8 minutes for aerobic conditioning, LE ROM and strengthening      Knee/Hip Exercises: Standing   Hip Abduction  Stengthening;Right    Abduction Limitations  attempted concentric strengthening with isometric hold pressing foot against heavy box but pt reporting significant pain in L knee and unable to activate R glute med.  Changed to sidelying      Knee/Hip Exercises: Sidelying   Hip ABduction  Strengthening;Right;Left;2 sets;10 reps    Hip ABduction Limitations  started with modified with knee bent but therapist controlling hip to maintain neutral; also performed lifting to neutral and holding x 5 seconds x 5 reps with therapist providng cues to maintain glute med activation.  Performed on L and R side    Other Sidelying Knee/Hip Exercises  Sidelying modified hip ABD with knee bent adding in 10 reps hip extension for glute max activation; 10 reps L and R side             PT Education - 03/22/19 1207    Education Details  change to sidelying exercises today due to knee pain - advised pt to perform HEP to tolerance    Person(s) Educated  Patient    Methods  Explanation    Comprehension  Verbalized understanding       PT Short Term Goals - 02/22/19 1339      PT SHORT TERM GOAL #1   Title  Pt will demonstrate independence with initial HEP and walking program for aerobic conditioning    Time  5    Period  Weeks    Status  New    Target Date  03/29/19      PT SHORT TERM GOAL #2   Title  Pt will decrease time to perform five time sit to  stand to </= 17 seconds    Baseline  20 seconds with UE support    Time  5    Period  Weeks    Status  New    Target Date  03/29/19      PT SHORT TERM GOAL #3   Title  Pt will increase BERG score to >/= 38/56 to indicate decreased falls risk    Baseline  34/56  Time  5    Period  Weeks    Status  New    Target Date  03/29/19      PT SHORT TERM GOAL #4   Title  Pt will participate in further gait assessment with DGI/stair negotiation assessment    Time  5    Period  Weeks    Status  New    Target Date  03/29/19      PT SHORT TERM GOAL #5   Title  Pt will ambulate x 115' over level indoor surfaces and negotiate around furniture to simulate home environment with cane MOD I without touching walls/furniture for extra support    Time  5    Period  Weeks    Status  New    Target Date  03/29/19        PT Long Term Goals - 02/22/19 1344      PT LONG TERM GOAL #1   Title  Pt will demonstrate independence with final LE strength/balance HEP, biking program and community wellness plan    Time  10    Period  Weeks    Status  New    Target Date  05/03/19      PT LONG TERM GOAL #2   Title  Pt will decrease five time sit to stand to </= 15 seconds from chair    Time  10    Period  Weeks    Status  New    Target Date  05/03/19      PT LONG TERM GOAL #3   Title  Pt will increase BERG to >/= 42/56 to indicate decreased falls risk    Time  10    Period  Weeks    Status  New    Target Date  05/03/19      PT LONG TERM GOAL #4   Title  Pt will increase DGI to >/= 18/24 with cane to indicate decreased risk for falls in community    Baseline  TBD    Time  10    Period  Weeks    Status  New    Target Date  05/03/19      PT LONG TERM GOAL #5   Title  Pt will ambulate x 200' outside over paved surfaces and negotiate curb with cane with supervision    Time  10    Period  Weeks    Status  New    Target Date  05/03/19            Plan - 03/22/19 1210    Clinical  Impression Statement  Attempted to review HEP and focus on hip extension and hip ABD strengthening but pt reporting significant increase in knee pain with WB today limiting patient's ability to perform each exercise in standing.  Performed aerobic conditioning and warm up on Nustep and then continued to address hip ABD and extension weakness in sidelying.  Pt required therapist assistance to maintain neutral hip ABD due to weakness even with knee bent.  Will continue to address in order to progress towards LTG.    Personal Factors and Comorbidities  Comorbidity 3+;Fitness;Social Background;Transportation    Comorbidities  anxiety, HTN, DM, HLD, diverticulitis, obesity, OA with bilat TKA, osteopenia, glaucoma, colectomy, lumbar laminectomy/decompression and cataract extraction    Examination-Activity Limitations  Caring for Others;Locomotion Level;Stairs;Stand    Examination-Participation Restrictions  Cleaning;Community Activity;Laundry;Meal Prep    Stability/Clinical Decision Making  Evolving/Moderate complexity    Rehab Potential  Good    PT Frequency  1x / week    PT Duration  Other (comment)   10 weeks   PT Treatment/Interventions  ADLs/Self Care Home Management;Aquatic Therapy;Electrical Stimulation;Cryotherapy;Moist Heat;DME Instruction;Gait training;Stair training;Functional mobility training;Therapeutic activities;Therapeutic exercise;Balance training;Neuromuscular re-education;Patient/family education;Orthotic Fit/Training;Passive range of motion;Dry needling;Taping    PT Next Visit Plan  Check STG by 3/17.  Warm up on Nustep.  How is HEP - knees feeling better?  Really focus on hip ABD strength!!  Progress standing balance HEP -  LE stretching, strengthening, sit <> stand, standing balance with narrow BOS, compliant surface, eyes closed, SLS training    Consulted and Agree with Plan of Care  Patient       Patient will benefit from skilled therapeutic intervention in order to improve the  following deficits and impairments:  Decreased balance, Decreased activity tolerance, Decreased strength, Difficulty walking, Impaired sensation, Pain  Visit Diagnosis: Other disturbances of skin sensation  Unsteadiness on feet  Difficulty in walking, not elsewhere classified  Chronic pain of right knee  Chronic pain of left knee     Problem List Patient Active Problem List   Diagnosis Date Noted  . Calculus of bile duct without cholangitis with obstruction   . Renal calcification 03/10/2019  . Tobacco use disorder, continuous 03/10/2019  . Transaminitis 02/04/2019  . Abnormal ultrasound of liver 02/04/2019  . Sessile rectal polyp 05/02/2018  . Internal hemorrhoids without complication [id number]  . Diverticulosis 05/02/2018  . 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 Chronic pain of both knees 08/19/2017  . Cataracts, bilateral 07/08/2015  . Hyperlipidemia associated with type 2 diabetes mellitus ([place]) 04/27/2015  . History of total knee arthroplasty 04/18/2014  . History of lumbar laminectomy for spinal cord decompression 05/04/2013  . Synovial cyst of lumbar spine 03/31/2013  . Spinal stenosis of lumbar region with neurogenic claudication 03/30/2013  . Synovial cyst of lumbar facet joint 03/30/2013  . Type 2 diabetes mellitus with neurological complications ([place]) [id number]  . Osteoarthritis of right knee 02/01/2013  . History of total knee replacement 02/01/2013  . Tobacco abuse 10/03/2012  . Hyperglycemia 10/03/2012  . Unspecified vitamin D deficiency   . Class 2 severe obesity due to excess calories with serious comorbidity and body mass index (BMI) of 35.0 to 35.9 in adult [facility])   . Pernicious anemia   . Glaucoma (increased eye pressure)   . Benign essential hypertension   . Osteopenia     Rico Junker, PT, DPT 03/22/19    12:17 PM    [facility] [address] [place], [place], [address] Phone: [phone]   Fax:  [phone]  Name: Alison [name] MRN: [id number] Date of Birth: 1945-06-30

## 2019-03-22 NOTE — Patient Instructions (Signed)
Access Code: [email or web address] URL: [email or web address] Date: 03/15/2019 Prepared by: Misty Stanley  Exercises Sit to Stand with Counter Support - 5 reps - 2 sets - 1x daily - 7x weekly Standing Marching - 5 reps - 1 sets - 3 seconds hold - 1x daily - 7x weekly Standing Hip Abduction with Counter Support - 10 reps - 2 sets - 1x daily - 7x weekly Standing Hip Extension with Counter Support - 10 reps - 2 sets - 1x daily - 7x weekly Narrow Stance with Counter Support - 3 sets - 10 second hold - 1x daily - 7x weekly

## 2019-03-25 ENCOUNTER — Ambulatory Visit: Payer: Medicare Other | Attending: Internal Medicine

## 2019-03-25 DIAGNOSIS — Z23 Encounter for immunization: Secondary | ICD-10-CM

## 2019-03-25 NOTE — Progress Notes (Signed)
   Covid-19 Vaccination Clinic  Name:  Alison [name]    MRN: [id number] DOB: February 04, 1945  03/25/2019  Alison [name] was observed post Covid-19 immunization for 15 minutes without incident. She was provided with Vaccine Information Sheet and instruction to access the V-Safe system.   Alison [name] was instructed to call 911 with any severe reactions post vaccine: Marland Kitchen Difficulty breathing  . Swelling of face and throat  . A fast heartbeat  . A bad rash all over body  . Dizziness and weakness    Immunizations Administered    Name Date Dose VIS Date Route   Pfizer COVID-19 Vaccine 03/25/2019  1:02 PM 0.3 mL 12/23/2018 Intramuscular   Manufacturer: [place]   Lot: [id number]   [place]: [place] COVID-19 Vaccine 03/25/2019  1:03 PM 0.3 mL 12/23/2018 Intramuscular   Manufacturer: [place]   Lot: [id number]   [place]: [place] COVID-19 Vaccine 03/25/2019  1:04 PM 0.3 mL 12/23/2018 Intramuscular   Manufacturer: [place]   Lot: [id number]   [place]: [id number]

## 2019-03-29 ENCOUNTER — Other Ambulatory Visit: Payer: Self-pay

## 2019-03-29 ENCOUNTER — Ambulatory Visit (INDEPENDENT_AMBULATORY_CARE_PROVIDER_SITE_OTHER): Payer: Medicare Other | Admitting: Family

## 2019-03-29 ENCOUNTER — Encounter: Payer: Self-pay | Admitting: Family

## 2019-03-29 VITALS — BP 148/88 | HR 78 | Temp 97.8°F | Ht 65.0 in | Wt 209.0 lb

## 2019-03-29 DIAGNOSIS — I10 Essential (primary) hypertension: Secondary | ICD-10-CM

## 2019-03-29 DIAGNOSIS — H6122 Impacted cerumen, left ear: Secondary | ICD-10-CM

## 2019-03-29 MED ORDER — LISINOPRIL 10 MG PO TABS: 10.0000 mg | ORAL_TABLET | Freq: Every day | ORAL | 1 refills | Status: DC

## 2019-03-29 NOTE — Patient Instructions (Signed)
- increase Lisinopril to 10 mg tablet one by mouth once daily for high blood pressure.Hold if B/p is less than 90/60  - check your blood pressure daily and notify provider's office if B/p > 140/90  - Avoid adding extra salt in diet - Recommend low carbohydrates,low saturated fats and high vegetable diet    DASH Eating Plan DASH stands for "Dietary Approaches to Stop Hypertension." The DASH eating plan is a healthy eating plan that has been shown to reduce high blood pressure (hypertension). It may also reduce your risk for type 2 diabetes, heart disease, and stroke. The DASH eating plan may also help with weight loss. What are tips for following this plan?  General guidelines  Avoid eating more than 2,300 mg (milligrams) of salt (sodium) a day. If you have hypertension, you may need to reduce your sodium intake to 1,500 mg a day.  Limit alcohol intake to no more than 1 drink a day for nonpregnant women and 2 drinks a day for men. One drink equals 12 oz of beer, 5 oz of wine, or 1 oz of hard liquor.  Work with your health care provider to maintain a healthy body weight or to lose weight. Ask what an ideal weight is for you.  Get at least 30 minutes of exercise that causes your heart to beat faster (aerobic exercise) most days of the week. Activities may include walking, swimming, or biking.  Work with your health care provider or diet and nutrition specialist (dietitian) to adjust your eating plan to your individual calorie needs. Reading food labels   Check food labels for the amount of sodium per serving. Choose foods with less than 5 percent of the Daily Value of sodium. Generally, foods with less than 300 mg of sodium per serving fit into this eating plan.  To find whole grains, look for the word "whole" as the first word in the ingredient list. Shopping  Buy products labeled as "low-sodium" or "no salt added."  Buy fresh foods. Avoid canned foods and premade or frozen  meals. Cooking  Avoid adding salt when cooking. Use salt-free seasonings or herbs instead of table salt or sea salt. Check with your health care provider or pharmacist before using salt substitutes.  Do not fry foods. Cook foods using healthy methods such as baking, boiling, grilling, and broiling instead.  Cook with heart-healthy oils, such as olive, canola, soybean, or sunflower oil. Meal planning  Eat a balanced diet that includes: ? 5 or more servings of fruits and vegetables each day. At each meal, try to fill half of your plate with fruits and vegetables. ? Up to 6-8 servings of whole grains each day. ? Less than 6 oz of lean meat, poultry, or fish each day. A 3-oz serving of meat is about the same size as a deck of cards. One egg equals 1 oz. ? 2 servings of low-fat dairy each day. ? A serving of nuts, seeds, or beans 5 times each week. ? Heart-healthy fats. Healthy fats called Omega-3 fatty acids are found in foods such as flaxseeds and coldwater fish, like sardines, salmon, and mackerel.  Limit how much you eat of the following: ? Canned or prepackaged foods. ? Food that is high in trans fat, such as fried foods. ? Food that is high in saturated fat, such as fatty meat. ? Sweets, desserts, sugary drinks, and other foods with added sugar. ? Full-fat dairy products.  Do not salt foods before eating.  Try to  eat at least 2 vegetarian meals each week.  Eat more home-cooked food and less restaurant, buffet, and fast food.  When eating at a restaurant, ask that your food be prepared with less salt or no salt, if possible. What foods are recommended? The items listed may not be a complete list. Talk with your dietitian about what dietary choices are best for you. Grains Whole-grain or whole-wheat bread. Whole-grain or whole-wheat pasta. Brown rice. [place]. Bulgur. Whole-grain and low-sodium cereals. Pita bread. Low-fat, low-sodium crackers. Whole-wheat flour  tortillas. Vegetables Fresh or frozen vegetables (raw, steamed, roasted, or grilled). Low-sodium or reduced-sodium tomato and vegetable juice. Low-sodium or reduced-sodium tomato sauce and tomato paste. Low-sodium or reduced-sodium canned vegetables. Fruits All fresh, dried, or frozen fruit. Canned fruit in natural juice (without added sugar). Meat and other protein foods Skinless chicken or [place]. Ground chicken or [place]. Pork with fat trimmed off. Fish and seafood. Egg whites. Dried beans, peas, or lentils. Unsalted nuts, nut butters, and seeds. Unsalted canned beans. Lean cuts of beef with fat trimmed off. Low-sodium, lean deli meat. Dairy Low-fat (1%) or fat-free (skim) milk. Fat-free, low-fat, or reduced-fat cheeses. Nonfat, low-sodium ricotta or cottage cheese. Low-fat or nonfat yogurt. Low-fat, low-sodium cheese. Fats and oils Soft margarine without trans fats. Vegetable oil. Low-fat, reduced-fat, or light mayonnaise and salad dressings (reduced-sodium). Canola, safflower, olive, soybean, and sunflower oils. Avocado. Seasoning and other foods Herbs. Spices. Seasoning mixes without salt. Unsalted popcorn and pretzels. Fat-free sweets. What foods are not recommended? The items listed may not be a complete list. Talk with your dietitian about what dietary choices are best for you. Grains Baked goods made with fat, such as croissants, muffins, or some breads. Dry pasta or rice meal packs. Vegetables Creamed or fried vegetables. Vegetables in a cheese sauce. Regular canned vegetables (not low-sodium or reduced-sodium). Regular canned tomato sauce and paste (not low-sodium or reduced-sodium). Regular tomato and vegetable juice (not low-sodium or reduced-sodium). Angie Fava. Olives. Fruits Canned fruit in a light or heavy syrup. Fried fruit. Fruit in cream or butter sauce. Meat and other protein foods Fatty cuts of meat. Ribs. Fried meat. Berniece Salines. Sausage. Bologna and other processed lunch meats.  Salami. Fatback. Hotdogs. Bratwurst. Salted nuts and seeds. Canned beans with added salt. Canned or smoked fish. Whole eggs or egg yolks. Chicken or [place] with skin. Dairy Whole or 2% milk, cream, and half-and-half. Whole or full-fat cream cheese. Whole-fat or sweetened yogurt. Full-fat cheese. Nondairy creamers. Whipped toppings. Processed cheese and cheese spreads. Fats and oils Butter. Stick margarine. Lard. Shortening. Ghee. Bacon fat. Tropical oils, such as coconut, palm kernel, or palm oil. Seasoning and other foods Salted popcorn and pretzels. Onion salt, garlic salt, seasoned salt, table salt, and sea salt. Worcestershire sauce. Tartar sauce. Barbecue sauce. Teriyaki sauce. Soy sauce, including reduced-sodium. Steak sauce. Canned and packaged gravies. Fish sauce. Oyster sauce. Cocktail sauce. Horseradish that you find on the shelf. Ketchup. Mustard. Meat flavorings and tenderizers. Bouillon cubes. Hot sauce and Tabasco sauce. Premade or packaged marinades. Premade or packaged taco seasonings. Relishes. Regular salad dressings. Where to find more information:  National Heart, Lung, and [place]: https://wilson-eaton.com/  American Heart Association: www.heart.org Summary  The DASH eating plan is a healthy eating plan that has been shown to reduce high blood pressure (hypertension). It may also reduce your risk for type 2 diabetes, heart disease, and stroke.  With the DASH eating plan, you should limit salt (sodium) intake to 2,300 mg a day. If you have  hypertension, you may need to reduce your sodium intake to 1,500 mg a day.  When on the DASH eating plan, aim to eat more fresh fruits and vegetables, whole grains, lean proteins, low-fat dairy, and heart-healthy fats.  Work with your health care provider or diet and nutrition specialist (dietitian) to adjust your eating plan to your individual calorie needs. This information is not intended to replace advice given to you by your health  care provider. Make sure you discuss any questions you have with your health care provider. Document Revised: 12/11/2016 Document Reviewed: 12/23/2015 Elsevier Patient Education  2020 [employer].

## 2019-03-29 NOTE — Progress Notes (Addendum)
Provider: Linsay Vogt FNP-C  Gayland Curry, DO  Patient Care Team: Gayland Curry, DO as PCP - General (Geriatric Medicine) Shirley Muscat Loreen Freud, MD as Referring Physician (Optometry) Latanya Maudlin, MD as Consulting Physician (Orthopedic Surgery) Warden Fillers, MD as Consulting Physician (Ophthalmology)  Extended Emergency Contact Information Primary Emergency Contact: Bowman,Lillie Address: [address]           [place], [place] [address] [name] of [place] Phone: [phone] Relation: Sister Secondary Emergency Contact: Masterson,[name] Address: [address]          [place], [place] [address] [name] of [place] Phone: [phone] Mobile Phone: [phone] Relation: Daughter  Code Status: Full Code  Goals of care: Advanced Directive information Advanced Directives 03/17/2019  Does Patient Have a Medical Advance Directive? No  Type of Advance Directive -  Does patient want to make changes to medical advance directive? -  Would patient like information on creating a medical advance directive? No - Patient declined  Pre-existing out of facility DNR order (yellow form or pink MOST form) -     Chief Complaint  Patient presents with  . Acute Visit    elevated blood pressure    HPI:  Pt is a 74 y.o. female seen today for an acute visit for evaluation of high blood pressure x 2 days.states SBP readings > 170's.she denies any headache,dizziness,vision changes,chest pain,palpitation or shortness of breath.she states not feeling stressed out or anxious.currently on lisinopril 5 mg tablet daily.she is worried that her cholesterol medication was stopped by Dr.Reed wonders whether it caused her blood pressure to go high.she had high liver enzymes 01/17/2019 AST 63,ALT 79 and ALk phos 224 though states labs are within normal done 03/14/2019 after her gall stone removal.On chart review,she had ERCP done by Dr.Stark 03/17/2019.she denies any abdominal pain,N/V or GI  bleeding. Her blood pressure today not lower than her home readings though not at goal.    Past Medical History:  Diagnosis Date  . Anxiety   . Benign essential hypertension   . Blood transfusion without reported diagnosis   . Diabetes mellitus without complication ([place])   . Diverticulitis of colon (without mention of hemorrhage)(562.11)   . Goiter, specified as simple   . Hyperlipidemia LDL goal < 100   . Intrahepatic bile duct dilation   . Numbness    TOES RT FOOT and toes left foot  . Obesity, unspecified   . Osteoarthrosis, unspecified whether generalized or localized, lower leg   . Osteopenia   . Other specified disease of nail    FUNGUS OF FINGERNAILS  . Pernicious anemia   . Unspecified glaucoma(365.9)   . Unspecified vitamin D deficiency   . Wears dentures   . Wears glasses    Past Surgical History:  Procedure Laterality Date  . BACK SURGERY    . CATARACT EXTRACTION     right eye 06/20/15 left eye 09/12/15  . COLECTOMY  2009  . COLONOSCOPY  09/06/2007   Dr Carol Ada  . DILATION AND CURETTAGE OF UTERUS    . ENDOSCOPIC RETROGRADE CHOLANGIOPANCREATOGRAPHY (ERCP) WITH PROPOFOL N/A 03/17/2019   Procedure: ENDOSCOPIC RETROGRADE CHOLANGIOPANCREATOGRAPHY (ERCP) WITH PROPOFOL;  Surgeon: Ladene Artist, MD;  Location: [facility] ENDOSCOPY;  Service: Endoscopy;  Laterality: N/A;  . JOINT REPLACEMENT     right  . LUMBAR LAMINECTOMY/DECOMPRESSION MICRODISCECTOMY N/A 03/30/2013   Procedure: CENTRAL DECOMPRESSION L4 - L5 AND EXCISION 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;  Surgeon: Tobi Bastos, MD;  Location:  [facility] ORS;  Service: Orthopedics;  Laterality: N/A;  . MULTIPLE TOOTH EXTRACTIONS    . REMOVAL OF STONES  03/17/2019   Procedure: REMOVAL OF STONES;  Surgeon: Ladene Artist, MD;  Location: [facility] END[facility]COPY;  Service: Endoscopy;;  . SIGMOIDOSCOPY  02/16/2018   Dr Fuller Plan  . SPHINCTEROTOMY  03/17/2019   Procedure: SPHINCTEROTOMY;  Surgeon: Ladene Artist, MD;  Location: [facility];  Service:  Endoscopy;;  . TOTAL KNEE ARTHROPLASTY Right 02/01/2013   Procedure: RIGHT TOTAL KNEE ARTHROPLASTY;  Surgeon: Tobi Bastos, MD;  Location: [facility];  Service: Orthopedics;  Laterality: Right;  . TOTAL KNEE ARTHROPLASTY Left 04/18/2014   Procedure: LEFT TOTAL KNEE ARTHROPLASTY;  Surgeon: Latanya Maudlin, MD;  Location: [facility];  Service: Orthopedics;  Laterality: Left;    Allergies  Allergen Reactions  . Aspirin Nausea Only    Can take 81 mg but can't take 329m, hard on stomach.    Outpatient Encounter Medications as of 03/29/2019  Medication Sig  . acetaminophen (TYLENOL) 650 MG CR tablet Take 1,300 mg by mouth in the morning and at bedtime.  .Marland Kitchenaspirin EC 81 MG tablet Take 81 mg by mouth daily.  . Calcium Carb-Cholecalciferol (CALCIUM 600 + D PO) Take 1 tablet by mouth daily.  . carboxymethylcellulose (REFRESH PLUS) 0.5 % SOLN Place 1 drop into both eyes 3 (three) times daily as needed (dry eyes).   . Cholecalciferol (VITAMIN D-3) 1000 UNITS CAPS Take 1,000 Units by mouth daily.   . COMBIGAN 0.2-0.5 % ophthalmic solution Place 1 drop into both eyes in the morning and at bedtime.   . gabapentin (NEURONTIN) 300 MG capsule Take 1 capsule (300 mg total) by mouth at bedtime.  . Homeopathic Products (THERAWORX RELIEF EX) Apply 1 application topically daily as needed (pain).  .Marland Kitchenlatanoprost (XALATAN) 0.005 % ophthalmic solution Place 1 drop into both eyes at bedtime.  .Marland Kitchenlisinopril (ZESTRIL) 5 MG tablet Take 1 tablet by mouth once daily  . meloxicam (MOBIC) 15 MG tablet Take 15 mg by mouth daily.   . Menthol, Topical Analgesic, (ICY HOT EX) Apply 1 application topically daily as needed (pain).  . metFORMIN (GLUCOPHAGE) 500 MG tablet TAKE 1/2 (ONE-HALF) TABLET BY MOUTH IN THE EVENING WITH SUPPER  . Multiple Vitamin (MULTIVITAMIN) tablet Take 1 tablet by mouth daily.  . Omega-3 Fatty Acids (OMEGA 3 PO) Take 1,200 mg by mouth daily.  .Marland Kitchentrolamine salicylate (ASPERCREME) 10 % cream Apply 1 application  topically as needed for muscle pain.   No facility-administered encounter medications on file as of 03/29/2019.    Review of Systems  Constitutional: Negative for appetite change, chills, fatigue and fever.  HENT: Negative for congestion, rhinorrhea, sinus pressure, sinus pain, sneezing and sore throat.   Eyes: Positive for visual disturbance. Negative for pain, discharge, redness and itching.       Wears eye glasses vision issues follows up with Opthalmology   Respiratory: Negative for cough, chest tightness, shortness of breath and wheezing.   Cardiovascular: Negative for chest pain, palpitations and leg swelling.  Gastrointestinal: Negative for abdominal distention, abdominal pain, constipation, diarrhea, nausea and vomiting.  Genitourinary: Negative for difficulty urinating, dysuria, flank pain, frequency, hematuria and urgency.  Musculoskeletal: Positive for arthralgias and gait problem. Negative for myalgias.       Walks with a cane when outside but holds onto the furniture.Exercising with Therapy x 1 week   Skin: Negative for color change, pallor and rash.  Neurological: Negative for dizziness, speech difficulty, weakness,  light-headedness, numbness and headaches.       Tingling on toes at night gabapentin effective   Hematological: Does not bruise/bleed easily.  Psychiatric/Behavioral: Negative for agitation and confusion. The patient is not nervous/anxious.        Sleeps 4-6 hrs at night     Immunization History  Administered Date(s) Administered  . Fluad Quad(high Dose 65+) 09/05/2018  . Influenza, High Dose Seasonal PF 10/02/2017  . Influenza,inj,Quad PF,6+ Mos 10/03/2012, 09/21/2014, 11/07/2015  . Influenza,inj,quad, With Preservative 11/12/2016  . Influenza-Unspecified 10/28/2013  . PFIZER SARS-COV-2 Vaccination 03/25/2019, 03/25/2019  . Pneumococcal Conjugate-13 01/04/2014, 01/04/2014, 11/04/2016  . Pneumococcal Polysaccharide-23 12/29/2012, 11/19/2017  . Zoster  Recombinat (Shingrix) 11/25/2016, 01/26/2017   Pertinent  Health Maintenance Due  Topic Date Due  . FOOT EXAM  12/28/2017  . OPHTHALMOLOGY EXAM  04/29/2018  . HEMOGLOBIN A1C  07/10/2019  . MAMMOGRAM  07/28/2020  . COLONOSCOPY  02/17/2023  . INFLUENZA VACCINE  Completed  . DEXA SCAN  Completed  . PNA vac Low Risk Adult  Completed   Fall Risk  03/10/2019 02/10/2019 01/19/2019 09/05/2018 06/07/2018  Falls in the past year? 0 0 0 0 0  Number falls in past yr: 0 0 0 0 0  Injury with Fall? 0 0 0 0 0  Risk for fall due to : - - - - -    Vitals:   03/29/19 0943  BP: (!) 148/88  Pulse: 78  Temp: 97.8 F (36.6 C)  TempSrc: Oral  SpO2: 98%  Weight: 209 lb (94.8 kg)  Height: _0  (1.651 m)   Body mass index is 34.78 kg/m. Physical Exam Vitals reviewed.  Constitutional:      General: She is not in acute distress.    Appearance: She is obese. She is not ill-appearing.  HENT:     Head: Normocephalic.     Right Ear: Tympanic membrane, ear canal and external ear normal. There is no impacted cerumen.     Left Ear: There is impacted cerumen.     Ears:     Comments: Left ear lavaged with warm water and hydrogen peroxide moderate amounts of cerumen obtained.patient tolerated procedure well.TM clear without any signs of infections noted.     Nose: Nose normal. No congestion or rhinorrhea.     Mouth/Throat:     Mouth: Mucous membranes are moist.     Pharynx: Oropharynx is clear. No oropharyngeal exudate or posterior oropharyngeal erythema.  Eyes:     General: No scleral icterus.       Right eye: No discharge.        Left eye: No discharge.     Conjunctiva/sclera: Conjunctivae normal.     Pupils: Pupils are equal, round, and reactive to light.  Neck:     Vascular: No carotid bruit.  Cardiovascular:     Rate and Rhythm: Normal rate and regular rhythm.     Pulses: Normal pulses.     Heart sounds: Normal heart sounds. No murmur. No friction rub. No gallop.   Pulmonary:     Effort:  Pulmonary effort is normal. No respiratory distress.     Breath sounds: Normal breath sounds. No wheezing, rhonchi or rales.  Chest:     Chest wall: No tenderness.  Abdominal:     General: Bowel sounds are normal. There is no distension.     Palpations: Abdomen is soft. There is no mass.     Tenderness: There is no abdominal tenderness. There is no right CVA tenderness,  left CVA tenderness, guarding or rebound.  Musculoskeletal:        General: No swelling or tenderness.     Cervical back: Normal range of motion. No rigidity or tenderness.     Right lower leg: No edema.     Left lower leg: No edema.     Comments: Unsteady gait ambulates with a cane.  Lymphadenopathy:     Cervical: No cervical adenopathy.  Skin:    General: Skin is warm and dry.     Coloration: Skin is not pale.     Findings: No bruising, erythema or rash.  Neurological:     Mental Status: She is alert and oriented to person, place, and time.     Cranial Nerves: No cranial nerve deficit.     Sensory: No sensory deficit.     Motor: No weakness.     Gait: Gait abnormal.  Psychiatric:        Mood and Affect: Mood normal.        Behavior: Behavior normal.        Thought Content: Thought content normal.        Judgment: Judgment normal.     Labs reviewed: Recent Labs    01/09/19 0814 01/17/19 0813 03/14/19 1159  NA 138 136 136  K 4.3 4.4 4.1  CL 104 103 101  CO2 _0 GLUCOSE 113* 120* 105*  BUN _1 CREATININE 0.90 0.93 0.83  CALCIUM 9.6 9.9 10.0   Recent Labs    01/17/19 0813 02/10/19 1040 03/14/19 1159  AST 63* 26 23  ALT 79* 28 21  ALKPHOS  --   --  77  BILITOT 0.6 0.4 0.4  PROT 7.8 7.6 8.2  ALBUMIN  --   --  4.2   Recent Labs    01/09/19 0814 01/17/19 0813 03/14/19 1159  WBC 10.0 12.1* 9.7  NEUTROABS 6,990 8,095* 5.9  HGB 12.3 12.5 13.0  HCT 38.3 38.6 39.4  MCV 88.5 87.5 88.1  PLT 398 418* 324.0   Lab Results  Component Value Date   TSH 1.00 05/14/2016   Lab Results   Component Value Date   HGBA1C 6.0 (H) 01/09/2019   Lab Results  Component Value Date   CHOL 114 01/09/2019   HDL 38 (L) 01/09/2019   LDLCALC 59 01/09/2019   TRIG 83 01/09/2019   CHOLHDL 3.0 01/09/2019    Significant Diagnostic Results in last 30 days:  MR LIVER W WO CONTRAST  Result Date: 03/06/2019 CLINICAL DATA:  Liver lesion. Elevated liver enzymes. EXAM: MRI ABDOMEN WITHOUT AND WITH CONTRAST TECHNIQUE: Multiplanar multisequence MR imaging of the abdomen was performed both before and after the administration of intravenous contrast. CONTRAST:  24m MULTIHANCE GADOBENATE DIMEGLUMINE 529 MG/ML IV SOLN COMPARISON:  CT chest of 09/20/2018, recent abdominal sonogram from January of 2021 FINDINGS: Lower chest: No signs of pleural effusion or consolidation. Limited assessment of the lower chest on MRI. Hepatobiliary: Lobular hepatic contours. No significant signs of fatty infiltration of the liver with homogeneous signal throughout the liver on in and out of phase T1 weighted gradient echo imaging. Probable 5 mm filling defect in the distal common bile duct, study was not performed as a dedicated MRCP. There is cholelithiasis. These gallstones display mixed signal intensity on T1 and are hypointense on T2. The area of concern in the distal common bile duct is hypointense on T2 and hyperintense on T1 best seen on coronal image eighteen of series 7 and  on T1 weighted gradient echo sequence image 41 of series 11, perhaps as large as 7 mm as noted on this imaging data set. Pancreas: No signs of pancreatic lesion, ductal dilation or peripancreatic inflammation. Spleen:  Spleen is normal size without focal lesion. Adrenals/Urinary Tract: Adrenal glands are normal. No signs of hydronephrosis. Peripherally calcified nonenhancing lesion arises from the medial right kidney. Small cyst in the posterior left kidney. Stomach/Bowel: Stomach and small bowel and in general gastrointestinal contrast ends are normal to  the extent they are visualized on this abdominal evaluation. Pelvic bowel loops are not imaged on study was not protocol for bowel evaluation. Vascular/Lymphatic: Vascular structures in the abdomen are patent. Signs of atherosclerotic calcification in the abdominal aorta. No signs of adenopathy. Other: Signs of ventral abdominal wall reconstruction with laxity and diastasis of rectus musculature, incompletely imaged. Musculoskeletal: No destructive bone process or acute bone lesion. IMPRESSION: 1. Strong suspicion for choledocholithiasis seen on multiple series. This could be confirmed with MRCP sequences particularly if intervention is considered. 2. No suspicious hepatic lesion 3. Or sign of infiltrative process to include fat deposition. Cholelithiasis. 4. Lobular hepatic contours with no significant signs of fatty infiltration of the liver. 5. Peripherally calcified right renal lesion may represent calcified cyst. Given location peripherally calcified small splenic artery aneurysm is considered, size is stable at between 1.3 and 1.5 cm. Consider follow-up within 3-6 months with renal protocol CT. 6. Signs of ventral abdominal wall reconstruction with laxity and diastasis of rectus musculature, incompletely imaged. 7. These results were called by telephone at the time of interpretation on 03/06/2019 at 11:06 am to provider [facility] REED , who verbally acknowledged these results. Electronically Signed   By: Zetta Bills M.D.   On: 03/06/2019 11:06   DG ERCP BILIARY & PANCREATIC DUCTS  Result Date: 03/17/2019 CLINICAL DATA:  74 year old female with the history biliary obstruction EXAM: ERCP TECHNIQUE: Multiple spot images obtained with the fluoroscopic device and submitted for interpretation post-procedure. FLUOROSCOPY TIME:  Fluoroscopy Time: 1 minutes 18 seconds COMPARISON:  None. FINDINGS: Limited intraoperative fluoroscopic spot images during ERCP. Initial image demonstrates endoscope projecting over the  upper abdomen. There is cannulation of the ampulla and retrograde infusion of contrast. Deployment of a retrieval balloon demonstrated. IMPRESSION: Limited images during ERCP demonstrates treatment of biliary debris/choledocholithiasis with deployment of balloon retrieval catheter. Please refer to the dictated operative report for full details of intraoperative findings and procedure. Electronically Signed   By: Corrie Mckusick D.O.   On: 03/17/2019 08:49   MR ABDOMEN WITH MRCP W CONTRAST  Addendum Date: 03/12/2019   ADDENDUM REPORT: 03/12/2019 10:10 ADDENDUM: Biliary duct, common bile duct and intrahepatic ducts with mild ductal distension, perhaps slightly increased from the previous imaging study approximately 7 mm for the extrahepatic biliary tree, common bile duct as compared to 5-6 mm on the previous study. These results will be called to the ordering clinician or representative by the Radiologist Assistant, and communication documented in the PACS or zVision Dashboard. Electronically Signed   By: Zetta Bills M.D.   On: 03/12/2019 10:10   Result Date: 03/12/2019 CLINICAL DATA:  Cholelithiasis/suspicion for choledocholithiasis. EXAM: MRI ABDOMEN WITH CONTRAST (WITH MRCP) TECHNIQUE: Multiplanar multisequence MR imaging of the abdomen was performed following the administration of intravenous contrast. Heavily T2-weighted images of the biliary and pancreatic ducts were obtained, and three-dimensional MRCP images were rendered by post processing. CONTRAST:  81m MULTIHANCE GADOBENATE DIMEGLUMINE 529 MG/ML IV SOLN COMPARISON:  MRI abdomen of 03/06/2019 FINDINGS: Lower  chest: No sign of pleural effusion or consolidation with limited assessment of the lung bases on MRI. Hepatobiliary: Signs of cholelithiasis with a 7 mm biliary calculus at the level of the distal common bile duct just above the ampulla. This is best seen in the dedicated MRCP sequences, particularly the thin section coronal images where it may  measure as much as 8 mm. Numerous gallstones in the gallbladder fundus largest 1.3 cm. Pancreas: No signs of focal pancreatic lesion. No pancreatic inflammation. Mild distension of the pancreatic duct unchanged. Within the normal range in terms of caliber. Spleen:  Normal size without focal lesion. Adrenals/Urinary Tract: Peripherally calcified lesion along the anterior hilar lip of the right kidney better displayed on previous CT studies measuring approximately 1.3 cm. Symmetric enhancement of the bilateral kidneys. Mildly thickened left adrenal gland unchanged. Stomach/Bowel: No signs of acute gastrointestinal process. Laxity of the anterior abdominal wall as before. Vascular/Lymphatic: Patent abdominal vasculature. No signs of adenopathy in the abdomen. Other: Partially visualized leiomyomas in the uterus, incompletely evaluated. Musculoskeletal: Signs of vertebral hemangiomata as before. Ventral abdominal wall laxity similar to prior exam. IMPRESSION: 1. Signs of choledocholithiasis confirmed with MRCP. 2. Sludge and stones in the gallbladder largest approximately 1.3 cm. 3. 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 For clarification as suggested a three-month follow-up may be helpful utilizing renal protocol CT. Electronically Signed: By: Zetta Bills M.D. On: 03/11/2019 19:38    Assessment/Plan   1. Benign essential hypertension High reading at home.lower readings here today though not at goal. - Advised to increase Lisinopril from 5 mg tablet  to 10 mg tablet one by mouth once daily Hold if B/p is less than 90/60  - Instructed to check your blood pressure daily at home and notify provider's office if B/p > 140/90. - Avoid adding extra salt in diet - Recommend low carbohydrates,low saturated fats and high vegetable diet  - increase water intake.  - Follow  up with Dr.Reed as scheduled on 04/10/2019   2. Cerumen impaction,left ear  Afebrile. Left ear lavaged with warm water and hydrogen peroxide moderate amounts of cerumen obtained.patient tolerated procedure well.TM clear without any signs of infections noted.   Family/ staff Communication: Reviewed plan of care with patient verbalized understanding.   Labs/tests ordered: None   Next Appointment: Has upcoming appointment with Dr.Reed 3.29.2021   Sandrea Hughs, NP

## 2019-03-30 ENCOUNTER — Telehealth: Payer: Self-pay

## 2019-03-30 ENCOUNTER — Ambulatory Visit: Payer: Medicare Other | Admitting: Physical Therapy

## 2019-03-30 NOTE — Telephone Encounter (Signed)
I would have her monitor the blood pressure, she took a total of 20 mg and based on her blood pressure readings is okay. She may have some dizziness or lightheadedness- and she may not. Recommend to move a little slower today when changing positions and making sure to stay well hydrated with water

## 2019-03-30 NOTE — Telephone Encounter (Signed)
Incoming call received from patient stating she took 2, 10 mg Lisinopril tablets and would like to know what to do.  Dinah prescribed the 10 mg's at yesterdays visit, yet patient was going to take 2 of the 5 mg tablets to use up that supply and then start with the 1 tablet of 10 mg's.   Patient thought she was taking 2, 5 mg tablet and realized her mistake and is now very nervous that her B/P will drop too low   Patient took b/p 1-2 hours after taking 2, 10 mg tablets and got a reading of 162/86. Patient took b/p while on the phone and got a reading of 220/113 and states she does not think the machine is reading right and it could also be because she is panicking about taking too much medication. Patient denies any other symptoms.  Please advise

## 2019-03-30 NOTE — Telephone Encounter (Signed)
Patient aware of Jessica's response and was glad to get confirmation that she is ok. Patient states her sister was telling her she was ok yet she was still worried until a provider stated she was ok.  Patient took B/P at 11:00 am - 162/90, patient still denied having any other symptoms at this time. Patient will resume regular dose tomorrow of Lisinopril 10 mg

## 2019-04-05 ENCOUNTER — Other Ambulatory Visit: Payer: Self-pay

## 2019-04-05 ENCOUNTER — Ambulatory Visit: Payer: Medicare Other | Admitting: Physical Therapy

## 2019-04-05 ENCOUNTER — Encounter: Payer: Self-pay | Admitting: Physical Therapy

## 2019-04-05 VITALS — BP 150/88 | HR 68

## 2019-04-05 DIAGNOSIS — R208 Other disturbances of skin sensation: Secondary | ICD-10-CM

## 2019-04-05 DIAGNOSIS — M25562 Pain in left knee: Secondary | ICD-10-CM | POA: Diagnosis not present

## 2019-04-05 DIAGNOSIS — R209 Unspecified disturbances of skin sensation: Secondary | ICD-10-CM | POA: Diagnosis not present

## 2019-04-05 DIAGNOSIS — R262 Difficulty in walking, not elsewhere classified: Secondary | ICD-10-CM | POA: Diagnosis not present

## 2019-04-05 DIAGNOSIS — R2681 Unsteadiness on feet: Secondary | ICD-10-CM

## 2019-04-05 DIAGNOSIS — M25561 Pain in right knee: Secondary | ICD-10-CM | POA: Diagnosis not present

## 2019-04-05 DIAGNOSIS — G8929 Other chronic pain: Secondary | ICD-10-CM

## 2019-04-05 NOTE — Therapy (Signed)
[facility] [address] [place], [place], [address] Phone: [phone]   Fax:  [phone]  Physical Therapy Treatment  Patient Details  Name: Alison Alison [name] MRN: [id number] Date of Birth: Jul 07, 1945 Referring Provider (PT): Gayland Curry, DO   Encounter Date: 04/05/2019  PT End of Session - 04/05/19 1213    Visit Number  4    Number of Visits  11    Date for PT Re-Evaluation  05/03/19    Authorization Type  Medicare Primary/[facility] Secondary - 10th visit PN    Progress Note Due on Visit  10    PT Start Time  0938    PT Stop Time  1018    PT Time Calculation (min)  40 min    Activity Tolerance  Patient tolerated treatment well    Behavior During Therapy  [facility] for tasks assessed/performed       Past Medical History:  Diagnosis Date  . Anxiety   . Benign essential hypertension   . Blood transfusion without reported diagnosis   . Diabetes mellitus without complication ([place])   . Diverticulitis of colon (without mention of hemorrhage)(562.11)   . Goiter, specified as simple   . Hyperlipidemia LDL goal < 100   . Intrahepatic bile duct dilation   . Numbness    TOES RT FOOT and toes left foot  . Obesity, unspecified   . Osteoarthrosis, unspecified whether generalized or localized, lower leg   . Osteopenia   . Other specified disease of nail    FUNGUS OF FINGERNAILS  . Pernicious anemia   . Unspecified glaucoma(365.9)   . Unspecified vitamin D deficiency   . Wears dentures   . Wears glasses     Past Surgical History:  Procedure Laterality Date  . BACK SURGERY    . CATARACT EXTRACTION     right eye 06/20/15 left eye 09/12/15  . COLECTOMY  2009  . COLONOSCOPY  09/06/2007   Dr Carol Ada  . DILATION AND CURETTAGE OF UTERUS    . ENDOSCOPIC RETROGRADE CHOLANGIOPANCREATOGRAPHY (ERCP) WITH PROPOFOL N/A 03/17/2019   Procedure: ENDOSCOPIC RETROGRADE CHOLANGIOPANCREATOGRAPHY (ERCP) WITH PROPOFOL;  Surgeon: Ladene Artist, MD;  Location: [facility] ENDOSCOPY;  Service: Endoscopy;  Laterality: N/A;  . JOINT REPLACEMENT     right  . LUMBAR LAMINECTOMY/DECOMPRESSION MICRODISCECTOMY N/A 03/30/2013   Procedure: CENTRAL DECOMPRESSION L4 - L5 AND EXCISION 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;  Surgeon: Tobi Bastos, MD;  Location: [facility];  Service: Orthopedics;  Laterality: N/A;  . MULTIPLE TOOTH EXTRACTIONS    . REMOVAL OF STONES  03/17/2019   Procedure: REMOVAL OF STONES;  Surgeon: Ladene Artist, MD;  Location: [facility] ENDOSCOPY;  Service: Endoscopy;;  . SIGMOIDOSCOPY  02/16/2018   Dr Fuller Plan  . SPHINCTEROTOMY  03/17/2019   Procedure: SPHINCTEROTOMY;  Surgeon: Ladene Artist, MD;  Location: [facility];  Service: Endoscopy;;  . TOTAL KNEE ARTHROPLASTY Right 02/01/2013   Procedure: RIGHT TOTAL KNEE ARTHROPLASTY;  Surgeon: Tobi Bastos, MD;  Location: [facility];  Service: Orthopedics;  Laterality: Right;  . TOTAL KNEE ARTHROPLASTY Left 04/18/2014   Procedure: LEFT TOTAL KNEE ARTHROPLASTY;  Surgeon: Latanya Maudlin, MD;  Location: [facility];  Service: Orthopedics;  Laterality: Left;    Vitals:   04/05/19 0950  BP: (!) 150/88  Pulse: 68    Subjective Assessment - 04/05/19 0945    Subjective  "Dragging".  Pt feeling a little sad today but not sure why she feels sad.  Has been trying to monitor her BP at home but isn't sure she is doing it right.  Has arm cuff, and wrist cuff but they read very differently.  Physician increased BP medication.  Alison [name] falls or issues with exercises.    Pertinent History  anxiety, HTN, DM, HLD, diverticulitis, obesity, OA with bilat TKA, osteopenia, glaucoma, colectomy, lumbar laminectomy/decompression and cataract extraction    Patient Stated Goals  Help the pain and help the balance    Currently in Pain?  Alison [name]/denies         [facility] PT Assessment - 04/05/19 1002      Standardized Balance Assessment   Standardized Balance Assessment  Berg Balance Test;Five Times Sit to Stand    Five times sit to  stand comments   15.03 seconds with use of UE on arm rests      Berg Balance Test   Sit to Stand  Able to stand without using hands and stabilize independently    Standing Unsupported  Able to stand safely 2 minutes    Sitting with Back Unsupported but Feet Supported on Floor or Stool  Able to sit safely and securely 2 minutes    Stand to Sit  Sits safely with minimal use of hands    Transfers  Able to transfer safely, minor use of hands    Standing Unsupported with Eyes Closed  Able to stand 10 seconds safely    Standing Unsupported with Feet Together  Able to place feet together independently and stand 1 minute safely    From Standing, Reach Forward with Outstretched Arm  Can reach confidently >25 cm (10")    From Standing Position, Pick up Object from Floor  Able to pick up shoe, needs supervision    From Standing Position, Turn to Look Behind Over each Shoulder  Looks behind from both sides and weight shifts well    Turn 360 Degrees  Able to turn 360 degrees safely but slowly    Standing Unsupported, Alternately Place Feet on Step/Stool  Able to complete 4 steps without aid or supervision    Standing Unsupported, One Foot in Front  Able to plae foot ahead of the other independently and hold 30 seconds    Standing on One Leg  Tries to lift leg/unable to hold 3 seconds but remains standing independently    Total Score  47    Berg comment:  47/56 medium falls risk                   [facility] Adult PT Treatment/Exercise - 04/05/19 0953      Knee/Hip Exercises: Aerobic   Nustep  level 6 x 6 minutes with bilat UE and LE for aerobic conditioning             PT Education - 04/05/19 1212    Education Details  progress towards goals; will continue STG assessment next session; advised to bring BP cuffs next session; will schedule 3 more visits to make up for missed visits    Person(s) Educated  Patient    Methods  Explanation    Comprehension  Verbalized understanding        PT Short Term Goals - 04/05/19 1015      PT SHORT TERM GOAL #1   Title  Pt will demonstrate independence with initial HEP and walking program for aerobic conditioning    Time  5    Period  Weeks    Status  On-going    Target  Date  03/29/19      PT SHORT TERM GOAL #2   Title  Pt will decrease time to perform five time sit to stand to </= 17 seconds    Baseline  15 seconds with UE    Time  5    Period  Weeks    Status  Achieved    Target Date  03/29/19      PT SHORT TERM GOAL #3   Title  Pt will increase BERG score to >/= 38/56 to indicate decreased falls risk    Baseline  47/56    Time  5    Period  Weeks    Status  Achieved    Target Date  03/29/19      PT SHORT TERM GOAL #4   Title  Pt will participate in further gait assessment with DGI/stair negotiation assessment    Time  5    Period  Weeks    Status  Achieved    Target Date  03/29/19      PT SHORT TERM GOAL #5   Title  Pt will ambulate x 115' over level indoor surfaces and negotiate around furniture to simulate home environment with cane MOD I without touching walls/furniture for extra support    Time  5    Period  Weeks    Status  On-going    Target Date  03/29/19        PT Long Term Goals - 02/22/19 1344      PT LONG TERM GOAL #1   Title  Pt will demonstrate independence with final LE strength/balance HEP, biking program and community wellness plan    Time  10    Period  Weeks    Status  New    Target Date  05/03/19      PT LONG TERM GOAL #2   Title  Pt will decrease five time sit to stand to </= 15 seconds from chair    Time  10    Period  Weeks    Status  New    Target Date  05/03/19      PT LONG TERM GOAL #3   Title  Pt will increase BERG to >/= 42/56 to indicate decreased falls risk    Time  10    Period  Weeks    Status  New    Target Date  05/03/19      PT LONG TERM GOAL #4   Title  Pt will increase DGI to >/= 18/24 with cane to indicate decreased risk for falls in community     Baseline  TBD    Time  10    Period  Weeks    Status  New    Target Date  05/03/19      PT LONG TERM GOAL #5   Title  Pt will ambulate x 200' outside over paved surfaces and negotiate curb with cane with supervision    Time  10    Period  Weeks    Status  New    Target Date  05/03/19            Plan - 04/05/19 1218    Clinical Impression Statement  Treatment session focused on assessment of progress towards STG.  Pt is making good progress and has met 3/5 goals with final two STG to be assessed at next visit.  Pt demonstrates improvement in aerobic endurance, LE strength and standing balance but pt continues to be at  medium falls risk - pt to continue to address these impairments to maximize functional mobility independence and decrease falls risk.  Will also calibrate patient's home BP cuff next visit to ensure accurate monitoring.    Personal Factors and Comorbidities  Comorbidity 3+;Fitness;Social Background;Transportation    Comorbidities  anxiety, HTN, DM, HLD, diverticulitis, obesity, OA with bilat TKA, osteopenia, glaucoma, colectomy, lumbar laminectomy/decompression and cataract extraction    Examination-Activity Limitations  Caring for Others;Locomotion Level;Stairs;Stand    Examination-Participation Restrictions  Cleaning;Community Activity;Laundry;Meal Prep    Stability/Clinical Decision Making  Evolving/Moderate complexity    Rehab Potential  Good    PT Frequency  1x / week    PT Duration  Other (comment)   10 weeks   PT Treatment/Interventions  ADLs/Self Care Home Management;Aquatic Therapy;Electrical Stimulation;Cryotherapy;Moist Heat;DME Instruction;Gait training;Stair training;Functional mobility training;Therapeutic activities;Therapeutic exercise;Balance training;Neuromuscular re-education;Patient/family education;Orthotic Fit/Training;Passive range of motion;Dry needling;Taping    PT Next Visit Plan  Finish checking STG #1 and #5.  Calibrate home BP cuffs with our  manual cuff.  Warm up on Nustep, levle 6 x 6-8 minutes.  Really focus on hip ABD strength!!  Progress standing balance HEP -  LE stretching, strengthening, sit <> stand, standing balance with narrow BOS, compliant surface, eyes closed, SLS training    Consulted and Agree with Plan of Care  Patient       Patient will benefit from skilled therapeutic intervention in order to improve the following deficits and impairments:  Decreased balance, Decreased activity tolerance, Decreased strength, Difficulty walking, Impaired sensation, Pain  Visit Diagnosis: Other disturbances of skin sensation  Unsteadiness on feet  Difficulty in walking, not elsewhere classified  Chronic pain of right knee  Chronic pain of left knee     Problem List Patient Active Problem List   Diagnosis Date Noted  . Calculus of bile duct without cholangitis with obstruction   . Renal calcification 03/10/2019  . Tobacco use disorder, continuous 03/10/2019  . Transaminitis 02/04/2019  . Abnormal ultrasound of liver 02/04/2019  . Sessile rectal polyp 05/02/2018  . Internal hemorrhoids without complication 11/02/1171  . Diverticulosis 05/02/2018  . 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 Chronic pain of both knees 08/19/2017  . Cataracts, bilateral 07/08/2015  . Hyperlipidemia associated with type 2 diabetes mellitus ([place]) 04/27/2015  . History of total knee arthroplasty 04/18/2014  . History of lumbar laminectomy for spinal cord decompression 05/04/2013  . Synovial cyst of lumbar spine 03/31/2013  . Spinal stenosis of lumbar region with neurogenic claudication 03/30/2013  . Synovial cyst of lumbar facet joint 03/30/2013  . Type 2 diabetes mellitus with neurological complications ([place]) 56/70/1410  . Osteoarthritis of right knee 02/01/2013  . History of total knee replacement 02/01/2013  . Tobacco abuse 10/03/2012  . Hyperglycemia 10/03/2012  . Unspecified vitamin D deficiency   . Class 2  severe obesity due to excess calories with serious comorbidity and body mass index (BMI) of 35.0 to 35.9 in adult [facility])   . Pernicious anemia   . Glaucoma (increased eye pressure)   . Benign essential hypertension   . Osteopenia     Rico Junker, PT, DPT 04/05/19    12:23 PM    [facility] [address] [place], [place], [address] Phone: [phone]   Fax:  [phone]  Name: Alison Alison [name] MRN: [id number] Date of Birth: 09/14/1945

## 2019-04-06 ENCOUNTER — Ambulatory Visit: Payer: Medicare Other | Admitting: Internal Medicine

## 2019-04-10 ENCOUNTER — Other Ambulatory Visit: Payer: Self-pay

## 2019-04-10 ENCOUNTER — Ambulatory Visit (INDEPENDENT_AMBULATORY_CARE_PROVIDER_SITE_OTHER): Payer: Medicare Other | Admitting: Internal Medicine

## 2019-04-10 ENCOUNTER — Encounter: Payer: Self-pay | Admitting: Internal Medicine

## 2019-04-10 VITALS — BP 142/86 | HR 73 | Temp 97.5°F | Ht 65.0 in | Wt 206.6 lb

## 2019-04-10 DIAGNOSIS — Z6830 Body mass index (BMI) 30.0-30.9, adult: Secondary | ICD-10-CM | POA: Diagnosis not present

## 2019-04-10 DIAGNOSIS — E6609 Other obesity due to excess calories: Secondary | ICD-10-CM

## 2019-04-10 DIAGNOSIS — M25562 Pain in left knee: Secondary | ICD-10-CM

## 2019-04-10 DIAGNOSIS — Z6834 Body mass index (BMI) 34.0-34.9, adult: Secondary | ICD-10-CM

## 2019-04-10 DIAGNOSIS — G8929 Other chronic pain: Secondary | ICD-10-CM

## 2019-04-10 DIAGNOSIS — R310 Gross hematuria: Secondary | ICD-10-CM

## 2019-04-10 DIAGNOSIS — Z716 Tobacco abuse counseling: Secondary | ICD-10-CM

## 2019-04-10 DIAGNOSIS — E1169 Type 2 diabetes mellitus with other specified complication: Secondary | ICD-10-CM

## 2019-04-10 DIAGNOSIS — M25561 Pain in right knee: Secondary | ICD-10-CM | POA: Diagnosis not present

## 2019-04-10 DIAGNOSIS — E1149 Type 2 diabetes mellitus with other diabetic neurological complication: Secondary | ICD-10-CM | POA: Diagnosis not present

## 2019-04-10 DIAGNOSIS — I10 Essential (primary) hypertension: Secondary | ICD-10-CM | POA: Diagnosis not present

## 2019-04-10 DIAGNOSIS — E785 Hyperlipidemia, unspecified: Secondary | ICD-10-CM | POA: Diagnosis not present

## 2019-04-10 MED ORDER — ATORVASTATIN CALCIUM 40 MG PO TABS: 40.0000 mg | ORAL_TABLET | Freq: Every day | ORAL | 3 refills | Status: DC

## 2019-04-10 NOTE — Progress Notes (Signed)
Location:  [facility] clinic Provider:  Cameran Pettey L. Mariea Clonts, D.O., C.M.D.  Goals of Care:  Advanced Directives 04/10/2019  Does Patient Have a Medical Advance Directive? Yes  Type of Advance Directive Living will  Does patient want to make changes to medical advance directive? No - Patient declined  Would patient like information on creating a medical advance directive? -  Pre-existing out of facility DNR order (yellow form or pink MOST form) -     Chief Complaint  Patient presents with  . Medical Management of Chronic Issues    follow up on MRI for liver     HPI: Patient is a 74 y.o. female seen today for medical management of chronic diseases--to f/u on MRI of the liver which indicated that the liver itself was ok, but concern about a stone--MRCP recommended--it confirmed a CBD stone.    She had the gallstone in her bile duct removed by ERCP by Dr. Fuller Plan on 03/17/19.  She follows up with him on 05/01/19.  No abdominal pain since the procedure.    She has her urology appt due to hematuria.  Goes 04/20/19.  Says it's "been a minute" since she's seen the blood.  If she puts on a sanitary pad and sees some blood on there.  Was noting her panties were not getting clean when she washed them due to microscopic blood then.    BP elevated today.  She'd seen Dinah here about her BP back on 3/17.  Thinks her machine is not good and she takes her bp before her morning medications.  She has bought a wrist machine now instead.  It reads lower than the arm one.  Home readings with home arm cuff ranged 152-192/79-103. It was not near as high here when she saw Dinah--148/88.  She did increase her medication from lisinopril 5mg  to 10mg .  She's not on any other bp meds.    When she sits a long time and she got to get up, she gets lightheaded and the eyes have to get straight.  Not vertigo/spinning.  She also blames her neuropathy.    Back is not bothering her sitting.  It hurts when she does stuff mostly.    She has  lost 19 lbs in a year.    She has not smoked in 2 weeks.  She is proud of that.    She is trying to watch her sodium intake.  She has been eating out a lot--at least 1-2x per week.  She did not last week.  She did have ham yesterday.  She did not overdo it.  She has not had any fried foods.  She likes fish and fried chicken.  Did have baked fish, salmon.  She is eating mangos, bananas, broccoli, string beans and collard greens which are out the can.  Cheerios and milk for breakfast.  Likes cinnamon bagel with cream cheese also.  Eats small amounts at once.    Past Medical History:  Diagnosis Date  . Anxiety   . Benign essential hypertension   . Blood transfusion without reported diagnosis   . Diabetes mellitus without complication ([place])   . Diverticulitis of colon (without mention of hemorrhage)(562.11)   . Goiter, specified as simple   . Hyperlipidemia LDL goal < 100   . Intrahepatic bile duct dilation   . Numbness    TOES RT FOOT and toes left foot  . Obesity, unspecified   . Osteoarthrosis, unspecified whether generalized or localized, lower leg   .  Osteopenia   . Other specified disease of nail    FUNGUS OF FINGERNAILS  . Pernicious anemia   . Unspecified glaucoma(365.9)   . Unspecified vitamin D deficiency   . Wears dentures   . Wears glasses     Past Surgical History:  Procedure Laterality Date  . BACK SURGERY    . CATARACT EXTRACTION     right eye 06/20/15 left eye 09/12/15  . COLECTOMY  2009  . COLONOSCOPY  09/06/2007   Dr Carol Ada  . DILATION AND CURETTAGE OF UTERUS    . ENDOSCOPIC RETROGRADE CHOLANGIOPANCREATOGRAPHY (ERCP) WITH PROPOFOL N/A 03/17/2019   Procedure: ENDOSCOPIC RETROGRADE CHOLANGIOPANCREATOGRAPHY (ERCP) WITH PROPOFOL;  Surgeon: Ladene Artist, MD;  Location: [facility] ENDOSCOPY;  Service: Endoscopy;  Laterality: N/A;  . JOINT REPLACEMENT     right  . LUMBAR LAMINECTOMY/DECOMPRESSION MICRODISCECTOMY N/A 03/30/2013   Procedure: CENTRAL DECOMPRESSION L4 - L5  AND EXCISION 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;  Surgeon: Tobi Bastos, MD;  Location: [facility];  Service: Orthopedics;  Laterality: N/A;  . MULTIPLE TOOTH EXTRACTIONS    . REMOVAL OF STONES  03/17/2019   Procedure: REMOVAL OF STONES;  Surgeon: Ladene Artist, MD;  Location: [facility] ENDOSCOPY;  Service: Endoscopy;;  . SIGMOIDOSCOPY  02/16/2018   Dr Fuller Plan  . SPHINCTEROTOMY  03/17/2019   Procedure: SPHINCTEROTOMY;  Surgeon: Ladene Artist, MD;  Location: [place];  Service: Endoscopy;;  . TOTAL KNEE ARTHROPLASTY Right 02/01/2013   Procedure: RIGHT TOTAL KNEE ARTHROPLASTY;  Surgeon: Tobi Bastos, MD;  Location: [facility];  Service: Orthopedics;  Laterality: Right;  . TOTAL KNEE ARTHROPLASTY Left 04/18/2014   Procedure: LEFT TOTAL KNEE ARTHROPLASTY;  Surgeon: Latanya Maudlin, MD;  Location: [facility];  Service: Orthopedics;  Laterality: Left;    Allergies  Allergen Reactions  . Aspirin Nausea Only    Can take 81 mg but can't take 325mg , hard on stomach.    Outpatient Encounter Medications as of 04/10/2019  Medication Sig  . acetaminophen (TYLENOL) 650 MG CR tablet Take 1,300 mg by mouth in the morning and at bedtime.  Marland Kitchen aspirin EC 81 MG tablet Take 81 mg by mouth daily.  . Calcium Carb-Cholecalciferol (CALCIUM 600 + D PO) Take 1 tablet by mouth daily.  . carboxymethylcellulose (REFRESH PLUS) 0.5 % SOLN Place 1 drop into both eyes 3 (three) times daily as needed (dry eyes).   . Cholecalciferol (VITAMIN D-3) 1000 UNITS CAPS Take 1,000 Units by mouth daily.   . COMBIGAN 0.2-0.5 % ophthalmic solution Place 1 drop into both eyes in the morning and at bedtime.   . gabapentin (NEURONTIN) 300 MG capsule Take 1 capsule (300 mg total) by mouth at bedtime.  . Homeopathic Products (THERAWORX RELIEF EX) Apply 1 application topically daily as needed (pain).  Marland Kitchen latanoprost (XALATAN) 0.005 % ophthalmic solution Place 1 drop into both eyes at bedtime.  Marland Kitchen lisinopril (ZESTRIL) 10 MG tablet Take 1 tablet (10 mg total)  by mouth daily.  . meloxicam (MOBIC) 15 MG tablet Take 15 mg by mouth daily.   . Menthol, Topical Analgesic, (ICY HOT EX) Apply 1 application topically daily as needed (pain).  . metFORMIN (GLUCOPHAGE) 500 MG tablet TAKE 1/2 (ONE-HALF) TABLET BY MOUTH IN THE EVENING WITH SUPPER  . Multiple Vitamin (MULTIVITAMIN) tablet Take 1 tablet by mouth daily.  . Omega-3 Fatty Acids (OMEGA 3 PO) Take 1,200 mg by mouth daily.  Marland Kitchen trolamine salicylate (ASPERCREME) 10 % cream Apply 1 application topically as needed for  muscle pain.   No facility-administered encounter medications on file as of 04/10/2019.    Review of Systems:  Review of Systems  Constitutional: Positive for weight loss. Negative for chills, fever and malaise/fatigue.  HENT: Negative for sore throat.   Eyes: Negative for blurred vision.  Respiratory: Negative for cough, sputum production, shortness of breath and wheezing.   Cardiovascular: Negative for chest pain, palpitations and leg swelling.  Gastrointestinal: Negative for abdominal pain, blood in stool, constipation, diarrhea and melena.  Genitourinary: Positive for frequency and hematuria. Negative for dysuria and urgency.       Drinks a lot of water  Musculoskeletal: Positive for back pain and joint pain. Negative for falls.       Pain from knees down lateral aspects of legs   Skin: Negative for itching and rash.  Neurological: Positive for sensory change. Negative for dizziness and loss of consciousness.       Numbness of feet   Psychiatric/Behavioral: Negative for depression and memory loss. The patient is not nervous/anxious and does not have insomnia.     Health Maintenance  Topic Date Due  . FOOT EXAM  12/28/2017  . OPHTHALMOLOGY EXAM  04/29/2018  . HEMOGLOBIN A1C  07/10/2019  . MAMMOGRAM  07/28/2020  . COLONOSCOPY  02/17/2023  . TETANUS/TDAP  10/29/2023  . INFLUENZA VACCINE  Completed  . DEXA SCAN  Completed  . Hepatitis C Screening  Completed  . PNA vac Low Risk  Adult  Completed    Physical Exam: Vitals:   04/10/19 0925  Weight: 206 lb 9.6 oz (93.7 kg)   Body mass index is 34.38 kg/m. Physical Exam Vitals reviewed.  Constitutional:      General: She is not in acute distress.    Appearance: Normal appearance. She is obese. She is not toxic-appearing.  HENT:     Head: Normocephalic and atraumatic.  Cardiovascular:     Rate and Rhythm: Normal rate and regular rhythm.     Pulses: Normal pulses.     Heart sounds: Normal heart sounds.  Pulmonary:     Effort: Pulmonary effort is normal.     Breath sounds: Normal breath sounds. No wheezing, rhonchi or rales.  Abdominal:     General: Bowel sounds are normal.     Palpations: Abdomen is soft.     Tenderness: There is no abdominal tenderness.  Musculoskeletal:        General: Tenderness (tender left SI region and across left lower back; bilateral lateral aspects of knees down lower legs) present. Normal range of motion.     Right lower leg: No edema.     Left lower leg: No edema.  Skin:    General: Skin is warm and dry.     Capillary Refill: Capillary refill takes less than 2 seconds.  Neurological:     General: No focal deficit present.     Mental Status: She is alert and oriented to person, place, and time.     Comments: Diminished sensation in bilateral feet  Psychiatric:        Mood and Affect: Mood normal.        Behavior: Behavior normal.     Labs reviewed: Basic Metabolic Panel: Recent Labs    01/09/19 0814 01/17/19 0813 03/14/19 1159  NA 138 136 136  K 4.3 4.4 4.1  CL 104 103 101  CO2 26 26 30   GLUCOSE 113* 120* 105*  BUN 13 14 11   CREATININE 0.90 0.93 0.83  CALCIUM 9.6 9.9  10.0   Liver Function Tests: Recent Labs    01/17/19 0813 02/10/19 1040 03/14/19 1159  AST 63* 26 23  ALT 79* 28 21  ALKPHOS  --   --  77  BILITOT 0.6 0.4 0.4  PROT 7.8 7.6 8.2  ALBUMIN  --   --  4.2   No results for input(s): LIPASE, AMYLASE in the last 8760 hours. No results for  input(s): AMMONIA in the last 8760 hours. CBC: Recent Labs    01/09/19 0814 01/17/19 0813 03/14/19 1159  WBC 10.0 12.1* 9.7  NEUTROABS 6,990 8,095* 5.9  HGB 12.3 12.5 13.0  HCT 38.3 38.6 39.4  MCV 88.5 87.5 88.1  PLT 398 418* 324.0   Lipid Panel: Recent Labs    09/01/18 0808 01/09/19 0814  CHOL 110 114  HDL 35* 38*  LDLCALC 58 59  TRIG 87 83  CHOLHDL 3.1 3.0   Lab Results  Component Value Date   HGBA1C 6.0 (H) 01/09/2019    Procedures since last visit: DG ERCP BILIARY & PANCREATIC DUCTS  Result Date: 03/17/2019 CLINICAL DATA:  74 year old female with the history biliary obstruction EXAM: ERCP TECHNIQUE: Multiple spot images obtained with the fluoroscopic device and submitted for interpretation post-procedure. FLUOROSCOPY TIME:  Fluoroscopy Time: 1 minutes 18 seconds COMPARISON:  None. FINDINGS: Limited intraoperative fluoroscopic spot images during ERCP. Initial image demonstrates endoscope projecting over the upper abdomen. There is cannulation of the ampulla and retrograde infusion of contrast. Deployment of a retrieval balloon demonstrated. IMPRESSION: Limited images during ERCP demonstrates treatment of biliary debris/choledocholithiasis with deployment of balloon retrieval catheter. Please refer to the dictated operative report for full details of intraoperative findings and procedure. Electronically Signed   By: Corrie Mckusick D.O.   On: 03/17/2019 08:49   MR ABDOMEN WITH MRCP W CONTRAST  Addendum Date: 03/12/2019   ADDENDUM REPORT: 03/12/2019 10:10 ADDENDUM: Biliary duct, common bile duct and intrahepatic ducts with mild ductal distension, perhaps slightly increased from the previous imaging study approximately 7 mm for the extrahepatic biliary tree, common bile duct as compared to 5-6 mm on the previous study. These results will be called to the ordering clinician or representative by the Radiologist Assistant, and communication documented in the PACS or zVision Dashboard.  Electronically Signed   By: Zetta Bills M.D.   On: 03/12/2019 10:10   Result Date: 03/12/2019 CLINICAL DATA:  Cholelithiasis/suspicion for choledocholithiasis. EXAM: MRI ABDOMEN WITH CONTRAST (WITH MRCP) TECHNIQUE: Multiplanar multisequence MR imaging of the abdomen was performed following the administration of intravenous contrast. Heavily T2-weighted images of the biliary and pancreatic ducts were obtained, and three-dimensional MRCP images were rendered by post processing. CONTRAST:  66mL MULTIHANCE GADOBENATE DIMEGLUMINE 529 MG/ML IV SOLN COMPARISON:  MRI abdomen of 03/06/2019 FINDINGS: Lower chest: No sign of pleural effusion or consolidation with limited assessment of the lung bases on MRI. Hepatobiliary: Signs of cholelithiasis with a 7 mm biliary calculus at the level of the distal common bile duct just above the ampulla. This is best seen in the dedicated MRCP sequences, particularly the thin section coronal images where it may measure as much as 8 mm. Numerous gallstones in the gallbladder fundus largest 1.3 cm. Pancreas: No signs of focal pancreatic lesion. No pancreatic inflammation. Mild distension of the pancreatic duct unchanged. Within the normal range in terms of caliber. Spleen:  Normal size without focal lesion. Adrenals/Urinary Tract: Peripherally calcified lesion along the anterior hilar lip of the right kidney better displayed on previous CT studies measuring approximately 1.3  cm. Symmetric enhancement of the bilateral kidneys. Mildly thickened left adrenal gland unchanged. Stomach/Bowel: No signs of acute gastrointestinal process. Laxity of the anterior abdominal wall as before. Vascular/Lymphatic: Patent abdominal vasculature. No signs of adenopathy in the abdomen. Other: Partially visualized leiomyomas in the uterus, incompletely evaluated. Musculoskeletal: Signs of vertebral hemangiomata as before. Ventral abdominal wall laxity similar to prior exam. IMPRESSION: 1. Signs of  choledocholithiasis confirmed with MRCP. 2. Sludge and stones in the gallbladder largest approximately 1.3 cm. 3. 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 For clarification as suggested a three-month follow-up may be helpful utilizing renal protocol CT. Electronically Signed: By: Zetta Bills M.D. On: 03/11/2019 19:38    Assessment/Plan 1. Hyperlipidemia associated with type 2 diabetes mellitus ([place]) - now that liver abnormalities resolved with stone removal, will resume lipitor therapy - atorvastatin (LIPITOR) 40 MG tablet; Take 1 tablet (40 mg total) by mouth daily.  Dispense: 90 tablet; Refill: 3  2. Benign essential hypertension -bp being taken before meds not after in the morning and she does not think her cuff is reliable so she got a new wrist cuff -advised to bring with her to PT (said therapist said she could) so it can be compared to manual there and calibrated  -if bps still high--staying over 140/90, I will increase lisinopril more (like to 40mg  to make a difference) and see how that goes -encouraged her to maintain hydration due to orthostasis she describes  3. Type 2 diabetes mellitus with neurological complications ([place]) -educated more on diet and given more information--has made a lot of good changes and has had gradual weight loss over the past year Lab Results  Component Value Date   HGBA1C 6.0 (H) 01/09/2019    4. Tobacco abuse counseling -has quit smoking 2-3 wks ago now and doing well -congratulated and supported her with this change -I hope she can stick with this   5. Chronic pain of both knees -s/p tkas and did well for a while, but had gained weight--encouraged to continue weight loss -using mobic with food which also can impact bp, but she has difficulty standing up and walking even with it -I'm not aware of much  ortho could do at this point so we opted to keep working on weight loss and discuss again next time -cont PT  6. Gross hematuria -has urology appt now next month, continuing to have staining of pantiliners  7.  BMI 34.0-34.9,adult -dietary advice and handout provided and encouragement that she's making better choices -she talked about wanting to lose weight and go back to prior eating and we discussed how there need to be a lot of long-term changes to maintain progress  9. Class 1 obesity due to excess calories with serious comorbidity and body mass index (BMI) of 30.0 to 34.9 in adult -remains, but has lost weight over the past year -remains diabetic and hypertensive and hyperlipidemia (was at goal until we had to hold lipitor for her abnormal liver tests)  Labs/tests ordered:   Lab Orders  No laboratory test(s) ordered today   Next appt: 05/22/2019    Jena Tegeler L. Addalie Calles, D.O. [facility] [address], [place] [address] Cell Phone (Mon-Fri 8am-5pm):  [phone] On Call:  [phone] & follow prompts after 5pm & weekends Office Phone:  [phone] Office Fax:  [phone]

## 2019-04-10 NOTE — Patient Instructions (Addendum)
Restart lipitor 40mg  nightly.  For your blood pressure, be sure to bring your wrist cuff to your therapy appt so PT can compare to the manual cuff there.  She can send me the information and we can adjust your medication more if needed.    Continue off those cigarettes!  I'm so proud of you!    DASH Eating Plan DASH stands for "Dietary Approaches to Stop Hypertension." The DASH eating plan is a healthy eating plan that has been shown to reduce high blood pressure (hypertension). It may also reduce your risk for type 2 diabetes, heart disease, and stroke. The DASH eating plan may also help with weight loss. What are tips for following this plan?  General guidelines  Avoid eating more than 2,300 mg (milligrams) of salt (sodium) a day. If you have hypertension, you may need to reduce your sodium intake to 1,500 mg a day.  Limit alcohol intake to no more than 1 drink a day for nonpregnant women and 2 drinks a day for men. One drink equals 12 oz of beer, 5 oz of wine, or 1 oz of hard liquor.  Work with your health care provider to maintain a healthy body weight or to lose weight. Ask what an ideal weight is for you.  Get at least 30 minutes of exercise that causes your heart to beat faster (aerobic exercise) most days of the week. Activities may include walking, swimming, or biking.  Work with your health care provider or diet and nutrition specialist (dietitian) to adjust your eating plan to your individual calorie needs. Reading food labels   Check food labels for the amount of sodium per serving. Choose foods with less than 5 percent of the Daily Value of sodium. Generally, foods with less than 300 mg of sodium per serving fit into this eating plan.  To find whole grains, look for the word "whole" as the first word in the ingredient list. Shopping  Buy products labeled as "low-sodium" or "no salt added."  Buy fresh foods. Avoid canned foods and premade or frozen meals. Cooking  Avoid  adding salt when cooking. Use salt-free seasonings or herbs instead of table salt or sea salt. Check with your health care provider or pharmacist before using salt substitutes.  Do not fry foods. Cook foods using healthy methods such as baking, boiling, grilling, and broiling instead.  Cook with heart-healthy oils, such as olive, canola, soybean, or sunflower oil. Meal planning  Eat a balanced diet that includes: ? 5 or more servings of fruits and vegetables each day. At each meal, try to fill half of your plate with fruits and vegetables. ? Up to 6-8 servings of whole grains each day. ? Less than 6 oz of lean meat, poultry, or fish each day. A 3-oz serving of meat is about the same size as a deck of cards. One egg equals 1 oz. ? 2 servings of low-fat dairy each day. ? A serving of nuts, seeds, or beans 5 times each week. ? Heart-healthy fats. Healthy fats called Omega-3 fatty acids are found in foods such as flaxseeds and coldwater fish, like sardines, salmon, and mackerel.  Limit how much you eat of the following: ? Canned or prepackaged foods. ? Food that is high in trans fat, such as fried foods. ? Food that is high in saturated fat, such as fatty meat. ? Sweets, desserts, sugary drinks, and other foods with added sugar. ? Full-fat dairy products.  Do not salt foods before eating.  Try to eat at least 2 vegetarian meals each week.  Eat more home-cooked food and less restaurant, buffet, and fast food.  When eating at a restaurant, ask that your food be prepared with less salt or no salt, if possible. What foods are recommended? The items listed may not be a complete list. Talk with your dietitian about what dietary choices are best for you. Grains Whole-grain or whole-wheat bread. Whole-grain or whole-wheat pasta. Brown rice. [place]. Bulgur. Whole-grain and low-sodium cereals. Pita bread. Low-fat, low-sodium crackers. Whole-wheat flour tortillas. Vegetables Fresh or  frozen vegetables (raw, steamed, roasted, or grilled). Low-sodium or reduced-sodium tomato and vegetable juice. Low-sodium or reduced-sodium tomato sauce and tomato paste. Low-sodium or reduced-sodium canned vegetables. Fruits All fresh, dried, or frozen fruit. Canned fruit in natural juice (without added sugar). Meat and other protein foods Skinless chicken or [place]. Ground chicken or [place]. Pork with fat trimmed off. Fish and seafood. Egg whites. Dried beans, peas, or lentils. Unsalted nuts, nut butters, and seeds. Unsalted canned beans. Lean cuts of beef with fat trimmed off. Low-sodium, lean deli meat. Dairy Low-fat (1%) or fat-free (skim) milk. Fat-free, low-fat, or reduced-fat cheeses. Nonfat, low-sodium ricotta or cottage cheese. Low-fat or nonfat yogurt. Low-fat, low-sodium cheese. Fats and oils Soft margarine without trans fats. Vegetable oil. Low-fat, reduced-fat, or light mayonnaise and salad dressings (reduced-sodium). Canola, safflower, olive, soybean, and sunflower oils. Avocado. Seasoning and other foods Herbs. Spices. Seasoning mixes without salt. Unsalted popcorn and pretzels. Fat-free sweets. What foods are not recommended? The items listed may not be a complete list. Talk with your dietitian about what dietary choices are best for you. Grains Baked goods made with fat, such as croissants, muffins, or some breads. Dry pasta or rice meal packs. Vegetables Creamed or fried vegetables. Vegetables in a cheese sauce. Regular canned vegetables (not low-sodium or reduced-sodium). Regular canned tomato sauce and paste (not low-sodium or reduced-sodium). Regular tomato and vegetable juice (not low-sodium or reduced-sodium). Angie Fava. Olives. Fruits Canned fruit in a light or heavy syrup. Fried fruit. Fruit in cream or butter sauce. Meat and other protein foods Fatty cuts of meat. Ribs. Fried meat. Berniece Salines. Sausage. Bologna and other processed lunch meats. Salami. Fatback. Hotdogs.  Bratwurst. Salted nuts and seeds. Canned beans with added salt. Canned or smoked fish. Whole eggs or egg yolks. Chicken or [place] with skin. Dairy Whole or 2% milk, cream, and half-and-half. Whole or full-fat cream cheese. Whole-fat or sweetened yogurt. Full-fat cheese. Nondairy creamers. Whipped toppings. Processed cheese and cheese spreads. Fats and oils Butter. Stick margarine. Lard. Shortening. Ghee. Bacon fat. Tropical oils, such as coconut, palm kernel, or palm oil. Seasoning and other foods Salted popcorn and pretzels. Onion salt, garlic salt, seasoned salt, table salt, and sea salt. Worcestershire sauce. Tartar sauce. Barbecue sauce. Teriyaki sauce. Soy sauce, including reduced-sodium. Steak sauce. Canned and packaged gravies. Fish sauce. Oyster sauce. Cocktail sauce. Horseradish that you find on the shelf. Ketchup. Mustard. Meat flavorings and tenderizers. Bouillon cubes. Hot sauce and Tabasco sauce. Premade or packaged marinades. Premade or packaged taco seasonings. Relishes. Regular salad dressings. Where to find more information:  National Heart, Lung, and [place]: https://wilson-eaton.com/  American Heart Association: www.heart.org Summary  The DASH eating plan is a healthy eating plan that has been shown to reduce high blood pressure (hypertension). It may also reduce your risk for type 2 diabetes, heart disease, and stroke.  With the DASH eating plan, you should limit salt (sodium) intake to 2,300 mg a day. If  you have hypertension, you may need to reduce your sodium intake to 1,500 mg a day.  When on the DASH eating plan, aim to eat more fresh fruits and vegetables, whole grains, lean proteins, low-fat dairy, and heart-healthy fats.  Work with your health care provider or diet and nutrition specialist (dietitian) to adjust your eating plan to your individual calorie needs. This information is not intended to replace advice given to you by your health care provider. Make sure you  discuss any questions you have with your health care provider. Document Revised: 12/11/2016 Document Reviewed: 12/23/2015 Elsevier Patient Education  2020 [employer].

## 2019-04-12 ENCOUNTER — Other Ambulatory Visit: Payer: Self-pay

## 2019-04-12 ENCOUNTER — Encounter: Payer: Self-pay | Admitting: Physical Therapy

## 2019-04-12 ENCOUNTER — Ambulatory Visit: Payer: Medicare Other | Admitting: Physical Therapy

## 2019-04-12 DIAGNOSIS — M25561 Pain in right knee: Secondary | ICD-10-CM | POA: Diagnosis not present

## 2019-04-12 DIAGNOSIS — G8929 Other chronic pain: Secondary | ICD-10-CM | POA: Diagnosis not present

## 2019-04-12 DIAGNOSIS — R262 Difficulty in walking, not elsewhere classified: Secondary | ICD-10-CM | POA: Diagnosis not present

## 2019-04-12 DIAGNOSIS — R209 Unspecified disturbances of skin sensation: Secondary | ICD-10-CM | POA: Diagnosis not present

## 2019-04-12 DIAGNOSIS — R2681 Unsteadiness on feet: Secondary | ICD-10-CM

## 2019-04-12 DIAGNOSIS — R208 Other disturbances of skin sensation: Secondary | ICD-10-CM

## 2019-04-12 DIAGNOSIS — M25562 Pain in left knee: Secondary | ICD-10-CM | POA: Diagnosis not present

## 2019-04-12 NOTE — Patient Instructions (Signed)
Access Code: [email or web address] URL: [email or web address] Date: 04/12/2019 Prepared by: Willow Ora  Exercises Sit to Stand with Counter Support - 1 x daily - 7 x weekly - 5 reps - 2 sets Standing Marching - 1 x daily - 7 x weekly - 5 reps - 1 sets - 3 seconds hold Standing Hip Abduction with Counter Support - 1 x daily - 7 x weekly - 10 reps - 2 sets Standing Hip Extension with Counter Support - 1 x daily - 7 x weekly - 10 reps - 2 sets Narrow Stance with Counter Support - 1 x daily - 7 x weekly - 3 sets - 10 second hold Standing Single Leg Stance with Counter Support - 1 x daily - 5 x weekly - 1 sets - 2-3 reps - 10 hold Narrow Stance with Unilateral Counter Support - 1 x daily - 5 x weekly - 1 sets - 3 reps - 15 hold

## 2019-04-12 NOTE — Therapy (Signed)
[facility] [address] [place], [place], [address] Phone: [phone]   Fax:  [phone]  Physical Therapy Treatment  Patient Details  Name: Alison [name] MRN: [id number] Date of Birth: [birth year] Referring Provider (PT): Gayland Curry, DO   Encounter Date: 04/12/2019  PT End of Session - 04/12/19 0936    Visit Number  5    Number of Visits  11    Date for PT Re-Evaluation  05/03/19    Authorization Type  Medicare Primary/[facility]I Secondary - 10th visit PN    Progress Note Due on Visit  10    PT Start Time  0931    PT Stop Time  1011    PT Time Calculation (min)  40 min    Activity Tolerance  Patient tolerated treatment well    Behavior During Therapy  [facility] for tasks assessed/performed       Past Medical History:  Diagnosis Date  . Anxiety   . Benign essential hypertension   . Blood transfusion without reported diagnosis   . Diabetes mellitus without complication ([place])   . Diverticulitis of colon (without mention of hemorrhage)(562.11)   . Goiter, specified as simple   . Hyperlipidemia LDL goal < 100   . Intrahepatic bile duct dilation   . Numbness    TOES RT FOOT and toes left foot  . Obesity, unspecified   . Osteoarthrosis, unspecified whether generalized or localized, lower leg   . Osteopenia   . Other specified disease of nail    FUNGUS OF FINGERNAILS  . Pernicious anemia   . Unspecified glaucoma(365.9)   . Unspecified vitamin D deficiency   . Wears dentures   . Wears glasses     Past Surgical History:  Procedure Laterality Date  . BACK SURGERY    . CATARACT EXTRACTION     right eye 06/20/15 left eye 09/12/15  . COLECTOMY  2009  . COLONOSCOPY  09/06/2007   Dr Carol Ada  . DILATION AND CURETTAGE OF UTERUS    . ENDOSCOPIC RETROGRADE CHOLANGIOPANCREATOGRAPHY (ERCP) WITH PROPOFOL N/A 03/17/2019   Procedure: ENDOSCOPIC RETROGRADE CHOLANGIOPANCREATOGRAPHY (ERCP) WITH PROPOFOL;  Surgeon: Ladene Artist, MD;  Location: [facility] ENDOSCOPY;  Service: Endoscopy;  Laterality: N/A;  . JOINT REPLACEMENT     right  . LUMBAR LAMINECTOMY/DECOMPRESSION MICRODISCECTOMY N/A 03/30/2013   Procedure: CENTRAL DECOMPRESSION L4 - L5 AND EXCISION 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;  Surgeon: Tobi Bastos, MD;  Location: [facility] ORS;  Service: Orthopedics;  Laterality: N/A;  . MULTIPLE TOOTH EXTRACTIONS    . REMOVAL OF STONES  03/17/2019   Procedure: REMOVAL OF STONES;  Surgeon: Ladene Artist, MD;  Location[facility] END[facility]COPY;  Service: Endoscopy;;  . SIGMOIDOSCOPY  02/16/2018   Dr Fuller Plan  . SPHINCTEROTOMY  03/17/2019   Procedure: SPHINCTEROTOMY;  Surgeon: Ladene Artist, MD;  Location: [facility];  Service: Endoscopy;;  . TOTAL KNEE ARTHROPLASTY Right 02/01/2013   Procedure: RIGHT TOTAL KNEE ARTHROPLASTY;  Surgeon: Tobi Bastos, MD;  Location: [facility] ORS;  Service: Orthopedics;  Laterality: Right;  . TOTAL KNEE ARTHROPLASTY Left 04/18/2014   Procedure: LEFT TOTAL KNEE ARTHROPLASTY;  Surgeon: Latanya Maudlin, MD;  Location: [facility] ORS;  Service: Orthopedics;  Laterality: Left;    There were no vitals filed for this visit.  Subjective Assessment - 04/12/19 0935    Subjective  No new complaitns. No falls. Knees are "achy" due to rainy weather. Did bring her arm and wrist cuffs to session today.  Pertinent History  anxiety, HTN, DM, HLD, diverticulitis, obesity, OA with bilat TKA, osteopenia, glaucoma, colectomy, lumbar laminectomy/decompression and cataract extraction    Patient Stated Goals  Help the pain and help the balance    Currently in Pain?  No/denies          [facility] Adult PT Treatment/Exercise - 04/12/19 0940      Transfers   Transfers  Sit to Stand;Stand to Sit    Sit to Stand  5: Supervision;With upper extremity assist    Stand to Sit  5: Supervision;With upper extremity assist      Ambulation/Gait   Ambulation/Gait  Yes    Ambulation/Gait Assistance  6: Modified independent (Device/Increase time)     Ambulation Distance (Feet)  120 Feet    Assistive device  Straight cane    Gait Pattern  Step-through pattern;Trendelenburg;Antalgic    Ambulation Surface  Level;Indoor    Gait Comments  had pt walk around obstacles/furniture with no issues noted.       Self-Care   Self-Care  Other Self-Care Comments    Other Self-Care Comments   worked on calibrating which BP maching pt has works best. manual BP 124/76. upper arm machine 115/79. 136/96 wrist cuff on lap, then 118/71 with wrist at heart level.  Pt advised to ensure wrist cuff is at heart level for most accurate readings.       Exercises   Exercises  Other Exercises    Other Exercises   reviewed with pt performing current HEP. cues needed at time on form. Added two new balance ex's to HEP today. Refer to [place] program for full details.  min guard assist for balance.           PT Short Term Goals - 04/05/19 1015      PT SHORT TERM GOAL #1   Title  Pt will demonstrate independence with initial HEP and walking program for aerobic conditioning    Time  5    Period  Weeks    Status  On-going    Target Date  03/29/19      PT SHORT TERM GOAL #2   Title  Pt will decrease time to perform five time sit to stand to </= 17 seconds    Baseline  15 seconds with UE    Time  5    Period  Weeks    Status  Achieved    Target Date  03/29/19      PT SHORT TERM GOAL #3   Title  Pt will increase BERG score to >/= 38/56 to indicate decreased falls risk    Baseline  47/56    Time  5    Period  Weeks    Status  Achieved    Target Date  03/29/19      PT SHORT TERM GOAL #4   Title  Pt will participate in further gait assessment with DGI/stair negotiation assessment    Time  5    Period  Weeks    Status  Achieved    Target Date  03/29/19      PT SHORT TERM GOAL #5   Title  Pt will ambulate x 115' over level indoor surfaces and negotiate around furniture to simulate home environment with cane MOD I without touching walls/furniture for extra  support    Time  5    Period  Weeks    Status  On-going    Target Date  03/29/19  PT Long Term Goals - 02/22/19 1344      PT LONG TERM GOAL #1   Title  Pt will demonstrate independence with final LE strength/balance HEP, biking program and community wellness plan    Time  10    Period  Weeks    Status  New    Target Date  05/03/19      PT LONG TERM GOAL #2   Title  Pt will decrease five time sit to stand to </= 15 seconds from chair    Time  10    Period  Weeks    Status  New    Target Date  05/03/19      PT LONG TERM GOAL #3   Title  Pt will increase BERG to >/= 42/56 to indicate decreased falls risk    Time  10    Period  Weeks    Status  New    Target Date  05/03/19      PT LONG TERM GOAL #4   Title  Pt will increase DGI to >/= 18/24 with cane to indicate decreased risk for falls in community    Baseline  TBD    Time  10    Period  Weeks    Status  New    Target Date  05/03/19      PT LONG TERM GOAL #5   Title  Pt will ambulate x 200' outside over paved surfaces and negotiate curb with cane with supervision    Time  10    Period  Weeks    Status  New    Target Date  05/03/19            Plan - 04/12/19 0936    Clinical Impression Statement  Today's skilled session focused on remaining STGs with goals met today. Added two additional exercises for balance to HEP as well. Also compared pt's BP cuffs to manually taken BP. Both cuffs were close to manual with wrist cuff needing to be heart level. If its lower it tends to read higher. The pt is progressing toward goals and should benefit from continued PT to progress toward unmet goals.    Personal Factors and Comorbidities  Comorbidity 3+;Fitness;Social Background;Transportation    Comorbidities  anxiety, HTN, DM, HLD, diverticulitis, obesity, OA with bilat TKA, osteopenia, glaucoma, colectomy, lumbar laminectomy/decompression and cataract extraction    Examination-Activity Limitations  Caring for  Others;Locomotion Level;Stairs;Stand    Examination-Participation Restrictions  Cleaning;Community Activity;Laundry;Meal Prep    Stability/Clinical Decision Making  Evolving/Moderate complexity    Rehab Potential  Good    PT Frequency  1x / week    PT Duration  Other (comment)   10 weeks   PT Treatment/Interventions  ADLs/Self Care Home Management;Aquatic Therapy;Electrical Stimulation;Cryotherapy;Moist Heat;DME Instruction;Gait training;Stair training;Functional mobility training;Therapeutic activities;Therapeutic exercise;Balance training;Neuromuscular re-education;Patient/family education;Orthotic Fit/Training;Passive range of motion;Dry needling;Taping    PT Next Visit Plan  Warm up on Nustep, levle 6 x 6-8 minutes.  Really focus on hip ABD strength!!  Progress standing balance HEP as needed; LE stretching, strengthening,  compliant surface, eyes closed, SLS training    PT Home Exercise Plan  Access Code: [email or web address]    Consulted and Agree with Plan of Care  Patient       Patient will benefit from skilled therapeutic intervention in order to improve the following deficits and impairments:  Decreased balance, Decreased activity tolerance, Decreased strength, Difficulty walking, Impaired sensation, Pain  Visit Diagnosis: Other disturbances of skin sensation  Unsteadiness  on feet  Difficulty in walking, not elsewhere classified  Chronic pain of right knee  Chronic pain of left knee     Problem List Patient Active Problem List   Diagnosis Date Noted  . Calculus of bile duct without cholangitis with obstruction   . Renal calcification 03/10/2019  . Tobacco use disorder, continuous 03/10/2019  . Transaminitis 02/04/2019  . Abnormal ultrasound of liver 02/04/2019  . Sessile rectal polyp 05/02/2018  . Internal hemorrhoids without complication 46/65/9935  . Diverticulosis 05/02/2018  . 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 Chronic pain of both knees 08/19/2017   . Cataracts, bilateral 07/08/2015  . Hyperlipidemia associated with type 2 diabetes mellitus ([place]) 04/27/2015  . History of total knee arthroplasty 04/18/2014  . History of lumbar laminectomy for spinal cord decompression 05/04/2013  . Synovial cyst of lumbar spine 03/31/2013  . Spinal stenosis of lumbar region with neurogenic claudication 03/30/2013  . Synovial cyst of lumbar facet joint 03/30/2013  . Type 2 diabetes mellitus with neurological complications ([place]) 70/17/7939  . Osteoarthritis of right knee 02/01/2013  . History of total knee replacement 02/01/2013  . Tobacco abuse 10/03/2012  . Hyperglycemia 10/03/2012  . Unspecified vitamin D deficiency   . Pernicious anemia   . Glaucoma (increased eye pressure)   . Benign essential hypertension   . Osteopenia     Willow Ora, PTA, [facility] [address] [place], [place] [address] [phone] 04/12/19, 3:44 PM   Name: Donnamarie Shankles MRN: [id number] Date of Birth: 1945-11-28

## 2019-04-14 ENCOUNTER — Ambulatory Visit: Payer: Self-pay | Admitting: Urology

## 2019-04-18 ENCOUNTER — Ambulatory Visit: Payer: Medicare Other | Attending: Internal Medicine

## 2019-04-18 DIAGNOSIS — Z23 Encounter for immunization: Secondary | ICD-10-CM

## 2019-04-18 NOTE — Progress Notes (Signed)
   Covid-19 Vaccination Clinic  Name:  Alison [name]    MRN: [id number] DOB: 14-Oct-1945  04/18/2019  [name] was observed post Covid-19 immunization for 15 minutes without incident. She was provided with Vaccine Information Sheet and instruction to access the V-Safe system.   [name] was instructed to call 911 with any severe reactions post vaccine: Marland Kitchen Difficulty breathing  . Swelling of face and throat  . A fast heartbeat  . A bad rash all over body  . Dizziness and weakness   Immunizations Administered    Name Date Dose VIS Date Route   Pfizer COVID-19 Vaccine 04/18/2019 11:43 AM 0.3 mL 12/23/2018 Intramuscular   Manufacturer: [employer]   Lot: [id number]   [place]: [id number]

## 2019-04-19 ENCOUNTER — Other Ambulatory Visit: Payer: Self-pay

## 2019-04-19 ENCOUNTER — Ambulatory Visit: Payer: Medicare Other | Attending: Internal Medicine | Admitting: Physical Therapy

## 2019-04-19 ENCOUNTER — Encounter: Payer: Self-pay | Admitting: Physical Therapy

## 2019-04-19 VITALS — BP 166/88

## 2019-04-19 DIAGNOSIS — M25561 Pain in right knee: Secondary | ICD-10-CM | POA: Diagnosis not present

## 2019-04-19 DIAGNOSIS — R2681 Unsteadiness on feet: Secondary | ICD-10-CM | POA: Diagnosis not present

## 2019-04-19 DIAGNOSIS — R208 Other disturbances of skin sensation: Secondary | ICD-10-CM

## 2019-04-19 DIAGNOSIS — G8929 Other chronic pain: Secondary | ICD-10-CM | POA: Diagnosis not present

## 2019-04-19 DIAGNOSIS — M25562 Pain in left knee: Secondary | ICD-10-CM | POA: Insufficient documentation

## 2019-04-19 DIAGNOSIS — R209 Unspecified disturbances of skin sensation: Secondary | ICD-10-CM | POA: Insufficient documentation

## 2019-04-19 DIAGNOSIS — R262 Difficulty in walking, not elsewhere classified: Secondary | ICD-10-CM | POA: Diagnosis not present

## 2019-04-19 NOTE — Therapy (Signed)
[facility] [address] [place], [place], [address] Phone: [phone]   Fax:  [phone]  Physical Therapy Treatment  Patient Details  Name: Alison [name] MRN: [id number] Date of Birth: 06-09-1945 Referring Provider (PT): Gayland Curry, DO   Encounter Date: 04/19/2019  PT End of Session - 04/19/19 0935    Visit Number  6    Number of Visits  11    Date for PT Re-Evaluation  05/03/19    Authorization Type  Medicare Primary/[facility] Secondary - 10th visit PN    Progress Note Due on Visit  10    PT Start Time  0932    PT Stop Time  1015    PT Time Calculation (min)  43 min    Equipment Utilized During Treatment  Gait belt    Activity Tolerance  Patient tolerated treatment well;No increased pain;Patient limited by fatigue    Behavior During Therapy  [facility] for tasks assessed/performed       Past Medical History:  Diagnosis Date  . Anxiety   . Benign essential hypertension   . Blood transfusion without reported diagnosis   . Diabetes mellitus without complication ([place])   . Diverticulitis of colon (without mention of hemorrhage)(562.11)   . Goiter, specified as simple   . Hyperlipidemia LDL goal < 100   . Intrahepatic bile duct dilation   . Numbness    TOES RT FOOT and toes left foot  . Obesity, unspecified   . Osteoarthrosis, unspecified whether generalized or localized, lower leg   . Osteopenia   . Other specified disease of nail    FUNGUS OF FINGERNAILS  . Pernicious anemia   . Unspecified glaucoma(365.9)   . Unspecified vitamin D deficiency   . Wears dentures   . Wears glasses     Past Surgical History:  Procedure Laterality Date  . BACK SURGERY    . CATARACT EXTRACTION     right eye 06/20/15 left eye 09/12/15  . COLECTOMY  2009  . COLONOSCOPY  09/06/2007   Dr Carol Ada  . DILATION AND CURETTAGE OF UTERUS    . ENDOSCOPIC RETROGRADE CHOLANGIOPANCREATOGRAPHY (ERCP) WITH PROPOFOL N/A 03/17/2019   Procedure: ENDOSCOPIC RETROGRADE CHOLANGIOPANCREATOGRAPHY (ERCP) WITH PROPOFOL;  Surgeon: Ladene Artist, MD;  Location: [facility] ENDOSCOPY;  Service: Endoscopy;  Laterality: N/A;  . JOINT REPLACEMENT     right  . LUMBAR LAMINECTOMY/DECOMPRESSION MICRODISCECTOMY N/A 03/30/2013   Procedure: CENTRAL DECOMPRESSION L4 - L5 AND EXCISION 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;  Surgeon: Tobi Bastos, MD;  Location: [facility];  Service: Orthopedics;  Laterality: N/A;  . MULTIPLE TOOTH EXTRACTIONS    . REMOVAL OF STONES  03/17/2019   Procedure: REMOVAL OF STONES;  Surgeon: Ladene Artist, MD;  Location: [facility] ENDOSCOPY;  Service: Endoscopy;;  . SIGMOIDOSCOPY  02/16/2018   Dr Fuller Plan  . SPHINCTEROTOMY  03/17/2019   Procedure: SPHINCTEROTOMY;  Surgeon: Ladene Artist, MD;  Location: [facility];  Service: Endoscopy;;  . TOTAL KNEE ARTHROPLASTY Right 02/01/2013   Procedure: RIGHT TOTAL KNEE ARTHROPLASTY;  Surgeon: Tobi Bastos, MD;  Location: [facility];  Service: Orthopedics;  Laterality: Right;  . TOTAL KNEE ARTHROPLASTY Left 04/18/2014   Procedure: LEFT TOTAL KNEE ARTHROPLASTY;  Surgeon: Latanya Maudlin, MD;  Location: [facility];  Service: Orthopedics;  Laterality: Left;    Vitals:   04/19/19 0939 04/19/19 0953  BP: (!) 152/85 (!) 166/88    Subjective Assessment - 04/19/19 0934    Subjective  No  new complaitns. No falls. Had her second dose of Vacinne yesterday. Knees are "a little achy" today.    Pertinent History  anxiety, HTN, DM, HLD, diverticulitis, obesity, OA with bilat TKA, osteopenia, glaucoma, colectomy, lumbar laminectomy/decompression and cataract extraction    Patient Stated Goals  Help the pain and help the balance    Currently in Pain?  No/denies            [facility] Adult PT Treatment/Exercise - 04/19/19 0936      Transfers   Transfers  Sit to Stand;Stand to Sit    Sit to Stand  5: Supervision;With upper extremity assist    Stand to Sit  5: Supervision;With upper extremity assist       Ambulation/Gait   Ambulation/Gait  Yes    Ambulation/Gait Assistance  6: Modified independent (Device/Increase time);4: Min guard    Ambulation/Gait Assistance Details  Mod I with cane on indoor surfaces around gym with session.     Ambulation Distance (Feet)  315 Feet   x1 in/outdoors; plus around gym with session.    Assistive device  Straight cane;None    Gait Pattern  Step-through pattern;Trendelenburg;Antalgic    Ambulation Surface  Level;Indoor;Unlevel;Outdoor;Paved      High Level Balance   High Level Balance Activities  Side stepping;Marching backwards;Marching forwards;Tandem walking   tandem gait fwd/bwd   High Level Balance Comments  on red/blue mats next to counter top: 3 laps each with light UE support on counter/cane on opposite side, min guard assist for balance. cues on form/technique.       Exercises   Exercises  Other Exercises    Other Exercises   sit<>stands with yoga block squeeze between knees for 10 reps, cues for full standing and slow, controlled descent; then with LEs pulling red band tight for sit<>stands for 10 reps, continued cues for tall standing and controlled descent.       Knee/Hip Exercises: Aerobic   Nustep  level 6 with UE/LEs for 6 minutes for strengthening and activity tolerance          PT Short Term Goals - 04/05/19 1015      PT SHORT TERM GOAL #1   Title  Pt will demonstrate independence with initial HEP and walking program for aerobic conditioning    Time  5    Period  Weeks    Status  On-going    Target Date  03/29/19      PT SHORT TERM GOAL #2   Title  Pt will decrease time to perform five time sit to stand to </= 17 seconds    Baseline  15 seconds with UE    Time  5    Period  Weeks    Status  Achieved    Target Date  03/29/19      PT SHORT TERM GOAL #3   Title  Pt will increase BERG score to >/= 38/56 to indicate decreased falls risk    Baseline  47/56    Time  5    Period  Weeks    Status  Achieved    Target Date   03/29/19      PT SHORT TERM GOAL #4   Title  Pt will participate in further gait assessment with DGI/stair negotiation assessment    Time  5    Period  Weeks    Status  Achieved    Target Date  03/29/19      PT SHORT TERM GOAL #5   Title  Pt will ambulate x 115' over level indoor surfaces and negotiate around furniture to simulate home environment with cane MOD I without touching walls/furniture for extra support    Time  5    Period  Weeks    Status  On-going    Target Date  03/29/19        PT Long Term Goals - 02/22/19 1344      PT LONG TERM GOAL #1   Title  Pt will demonstrate independence with final LE strength/balance HEP, biking program and community wellness plan    Time  10    Period  Weeks    Status  New    Target Date  05/03/19      PT LONG TERM GOAL #2   Title  Pt will decrease five time sit to stand to </= 15 seconds from chair    Time  10    Period  Weeks    Status  New    Target Date  05/03/19      PT LONG TERM GOAL #3   Title  Pt will increase BERG to >/= 42/56 to indicate decreased falls risk    Time  10    Period  Weeks    Status  New    Target Date  05/03/19      PT LONG TERM GOAL #4   Title  Pt will increase DGI to >/= 18/24 with cane to indicate decreased risk for falls in community    Baseline  TBD    Time  10    Period  Weeks    Status  New    Target Date  05/03/19      PT LONG TERM GOAL #5   Title  Pt will ambulate x 200' outside over paved surfaces and negotiate curb with cane with supervision    Time  10    Period  Weeks    Status  New    Target Date  05/03/19            Plan - 04/19/19 0936    Clinical Impression Statement  Today's skilled session continued to address strengthening and balance reactions with rest breaks taken as needed due to fatigue. Also began to address gait on various surfaces with cane today with no significant balance issues noted, fatigue reported afterwards. The pt is progressing toward goals and  should benefit from continued PT to progress toward unmet goals.    Personal Factors and Comorbidities  Comorbidity 3+;Fitness;Social Background;Transportation    Comorbidities  anxiety, HTN, DM, HLD, diverticulitis, obesity, OA with bilat TKA, osteopenia, glaucoma, colectomy, lumbar laminectomy/decompression and cataract extraction    Examination-Activity Limitations  Caring for Others;Locomotion Level;Stairs;Stand    Examination-Participation Restrictions  Cleaning;Community Activity;Laundry;Meal Prep    Stability/Clinical Decision Making  Evolving/Moderate complexity    Rehab Potential  Good    PT Frequency  1x / week    PT Duration  Other (comment)   10 weeks   PT Treatment/Interventions  ADLs/Self Care Home Management;Aquatic Therapy;Electrical Stimulation;Cryotherapy;Moist Heat;DME Instruction;Gait training;Stair training;Functional mobility training;Therapeutic activities;Therapeutic exercise;Balance training;Neuromuscular re-education;Patient/family education;Orthotic Fit/Training;Passive range of motion;Dry needling;Taping    PT Next Visit Plan  Warm up on Nustep, levle 6 x 6-8 minutes.  Really focus on hip ABD strength!!  Progress standing balance HEP -  LE stretching, strengthening, sit <> stand, standing balance with narrow BOS, compliant surface, eyes closed, SLS training    Consulted and Agree with Plan of Care  Patient  Patient will benefit from skilled therapeutic intervention in order to improve the following deficits and impairments:  Decreased balance, Decreased activity tolerance, Decreased strength, Difficulty walking, Impaired sensation, Pain  Visit Diagnosis: Other disturbances of skin sensation  Unsteadiness on feet  Difficulty in walking, not elsewhere classified  Chronic pain of right knee  Chronic pain of left knee     Problem List Patient Active Problem List   Diagnosis Date Noted  . Calculus of bile duct without cholangitis with obstruction   .  Renal calcification 03/10/2019  . Tobacco use disorder, continuous 03/10/2019  . Transaminitis 02/04/2019  . Abnormal ultrasound of liver 02/04/2019  . Sessile rectal polyp 05/02/2018  . Internal hemorrhoids without complication 99991111  . Diverticulosis 05/02/2018  . 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 Chronic pain of both knees 08/19/2017  . Cataracts, bilateral 07/08/2015  . Hyperlipidemia associated with type 2 diabetes mellitus ([place]) 04/27/2015  . History of total knee arthroplasty 04/18/2014  . History of lumbar laminectomy for spinal cord decompression 05/04/2013  . Synovial cyst of lumbar spine 03/31/2013  . Spinal stenosis of lumbar region with neurogenic claudication 03/30/2013  . Synovial cyst of lumbar facet joint 03/30/2013  . Type 2 diabetes mellitus with neurological complications ([place]) [id number]  . Osteoarthritis of right knee 02/01/2013  . History of total knee replacement 02/01/2013  . Tobacco abuse 10/03/2012  . Hyperglycemia 10/03/2012  . Unspecified vitamin D deficiency   . Pernicious anemia   . Glaucoma (increased eye pressure)   . Benign essential hypertension   . Osteopenia    Willow Ora, PTA, [facility] [address], [place], [place] [address] [phone] 04/19/19, 3:01 PM   Name: Alison [name] MRN: [id number] Date of Birth: [birth year]

## 2019-04-20 ENCOUNTER — Encounter: Payer: Self-pay | Admitting: Urology

## 2019-04-20 ENCOUNTER — Ambulatory Visit (INDEPENDENT_AMBULATORY_CARE_PROVIDER_SITE_OTHER): Payer: Medicare Other | Admitting: Urology

## 2019-04-20 VITALS — BP 178/99 | HR 78 | Ht 65.0 in | Wt 211.0 lb

## 2019-04-20 DIAGNOSIS — R310 Gross hematuria: Secondary | ICD-10-CM | POA: Diagnosis not present

## 2019-04-20 LAB — MICROSCOPIC EXAMINATION
Bacteria, UA: NONE SEEN
RBC, Urine: >30 /hpf — AB (ref 0–2)

## 2019-04-20 LAB — URINALYSIS, COMPLETE
Bilirubin, UA: NEGATIVE
Glucose, UA: NEGATIVE
Leukocytes,UA: NEGATIVE
Nitrite, UA: NEGATIVE
Protein,UA: NEGATIVE
Specific Gravity, UA: 1.02 (ref 1.005–1.030)
Urobilinogen, Ur: 0.2 mg/dL (ref 0.2–1.0)
pH, UA: 5.5 (ref 5.0–7.5)

## 2019-04-20 NOTE — Progress Notes (Addendum)
04/20/2019 01:30 PM   Alison [name] [birth year] [id number]  Referring provider: Gayland Curry, DO [place],  [place] [address]  Chief Complaint  Patient presents with  . Hematuria    HPI: Alison [name] is a 74 yo female seen at the request of Dr. Hollace Kinnier for evaluation and management of gross hematuria.  -Episode gross hematuria January 2021 -Though asymptomatic urine culture did grow E. Coli. -Recurrent hematuria short time after that episode -Denies flank, abdominal, pelvic pain -No bothersome lower urinary tract symptoms -No prior urologic evaluation -Hx of smoking for over 20 yrs 1/2 ppd, stopped smoking about a month ago   PMH: Past Medical History:  Diagnosis Date  . Anxiety   . Benign essential hypertension   . Blood transfusion without reported diagnosis   . Diabetes mellitus without complication ([place])   . Diverticulitis of colon (without mention of hemorrhage)(562.11)   . Goiter, specified as simple   . Hyperlipidemia LDL goal < 100   . Intrahepatic bile duct dilation   . Numbness    TOES RT FOOT and toes left foot  . Obesity, unspecified   . Osteoarthrosis, unspecified whether generalized or localized, lower leg   . Osteopenia   . Other specified disease of nail    FUNGUS OF FINGERNAILS  . Pernicious anemia   . Unspecified glaucoma(365.9)   . Unspecified vitamin D deficiency   . Wears dentures   . Wears glasses     Surgical History: Past Surgical History:  Procedure Laterality Date  . BACK SURGERY    . CATARACT EXTRACTION     right eye 06/20/15 left eye 09/12/15  . COLECTOMY  2009  . COLONOSCOPY  09/06/2007   Dr Carol Ada  . DILATION AND CURETTAGE OF UTERUS    . ENDOSCOPIC RETROGRADE CHOLANGIOPANCREATOGRAPHY (ERCP) WITH PROPOFOL N/A 03/17/2019   Procedure: ENDOSCOPIC RETROGRADE CHOLANGIOPANCREATOGRAPHY (ERCP) WITH PROPOFOL;  Surgeon: Ladene Artist, MD;  Location: [facility] ENDOSCOPY;  Service: Endoscopy;  Laterality: N/A;  .  JOINT REPLACEMENT     right  . LUMBAR LAMINECTOMY/DECOMPRESSION MICRODISCECTOMY N/A 03/30/2013   Procedure: CENTRAL DECOMPRESSION L4 - L5 AND EXCISION 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;  Surgeon: Tobi Bastos, MD;  Location: [facility];  Service: Orthopedics;  Laterality: N/A;  . MULTIPLE TOOTH EXTRACTIONS    . REMOVAL OF STONES  03/17/2019   Procedure: REMOVAL OF STONES;  Surgeon: Ladene Artist, MD;  Location: [facility] ENDOSCOPY;  Service: Endoscopy;;  . SIGMOIDOSCOPY  02/16/2018   Dr Fuller Plan  . SPHINCTEROTOMY  03/17/2019   Procedure: SPHINCTEROTOMY;  Surgeon: Ladene Artist, MD;  Location: [facility];  Service: Endoscopy;;  . TOTAL KNEE ARTHROPLASTY Right 02/01/2013   Procedure: RIGHT TOTAL KNEE ARTHROPLASTY;  Surgeon: Tobi Bastos, MD;  Location: [facility];  Service: Orthopedics;  Laterality: Right;  . TOTAL KNEE ARTHROPLASTY Left 04/18/2014   Procedure: LEFT TOTAL KNEE ARTHROPLASTY;  Surgeon: Latanya Maudlin, MD;  Location: [facility];  Service: Orthopedics;  Laterality: Left;    Home Medications:  Allergies as of 04/20/2019      Reactions   Aspirin Nausea Only   Can take 81 mg but can't take 325mg , hard on stomach.      Medication List       Accurate as of April 20, 2019  4:19 PM. If you have any questions, ask your nurse or doctor.        acetaminophen 650 MG CR tablet Commonly known as: TYLENOL Take 1,300 mg  by mouth in the morning and at bedtime.   aspirin EC 81 MG tablet Take 81 mg by mouth daily.   atorvastatin 40 MG tablet Commonly known as: LIPITOR Take 1 tablet (40 mg total) by mouth daily.   CALCIUM 600 + D PO Take 1 tablet by mouth daily.   carboxymethylcellulose 0.5 % Soln Commonly known as: REFRESH PLUS Place 1 drop into both eyes 3 (three) times daily as needed (dry eyes).   Combigan 0.2-0.5 % ophthalmic solution Generic drug: brimonidine-timolol Place 1 drop into both eyes in the morning and at bedtime.   gabapentin 300 MG capsule Commonly known as: NEURONTIN Take  1 capsule (300 mg total) by mouth at bedtime.   ICY HOT EX Apply 1 application topically daily as needed (pain).   latanoprost 0.005 % ophthalmic solution Commonly known as: XALATAN Place 1 drop into both eyes at bedtime.   lisinopril 10 MG tablet Commonly known as: ZESTRIL Take 1 tablet (10 mg total) by mouth daily.   meloxicam 15 MG tablet Commonly known as: MOBIC Take 15 mg by mouth daily.   metFORMIN 500 MG tablet Commonly known as: GLUCOPHAGE TAKE 1/2 (ONE-HALF) TABLET BY MOUTH IN THE EVENING WITH SUPPER   multivitamin tablet Take 1 tablet by mouth daily.   OMEGA 3 PO Take 1,200 mg by mouth daily.   THERAWORX RELIEF EX Apply 1 application topically daily as needed (pain).   trolamine salicylate 10 % cream Commonly known as: ASPERCREME Apply 1 application topically as needed for muscle pain.   Vitamin D-3 25 MCG (1000 UT) Caps Take 1,000 Units by mouth daily.       Allergies:  Allergies  Allergen Reactions  . Aspirin Nausea Only    Can take 81 mg but can't take 325mg , hard on stomach.    Family History: Family History  Problem Relation Age of Onset  . Alzheimer's disease Mother   . Stroke Mother   . Hyperlipidemia Mother   . Heart disease Father   . Diabetes Sister   . Colon polyps Neg Hx   . Colon cancer Neg Hx   . Esophageal cancer Neg Hx   . Rectal cancer Neg Hx   . Stomach cancer Neg Hx     Social History:  reports that she has been smoking cigarettes. She has a 15.00 pack-year smoking history. She has never used smokeless tobacco. She reports that she does not drink alcohol or use drugs.   Physical Exam: BP (!) 178/99   Pulse 78   Ht 5\' 5"  (1.651 m)   Wt 211 lb (95.7 kg)   BMI 35.11 kg/m   Constitutional:  Alert and oriented, No acute distress. HEENT: [place] AT, moist mucus membranes.  Trachea midline, no masses. Cardiovascular: No clubbing, cyanosis, or edema. Respiratory: Normal respiratory effort, no increased work of breathing.    Skin: No rashes, bruises or suspicious lesions. Neurologic: Grossly intact, no focal deficits, moving all 4 extremities. Psychiatric: Normal mood and affect.  Laboratory Data:  Lab Results  Component Value Date   CREATININE 0.83 03/14/2019    Urinalysis Results for orders placed or performed in visit on 04/20/19  Microscopic Examination   URINE  Result Value Ref Range   WBC, UA 0-5 0 - 5 /hpf   RBC >30 (A) 0 - 2 /hpf   Epithelial Cells (non renal) 0-10 0 - 10 /hpf   Casts Present (A) None seen /lpf   Cast Type Hyaline casts N/A   Bacteria, UA  None seen None seen/Few  Urinalysis, Complete  Result Value Ref Range   Specific Gravity, UA 1.020 1.005 - 1.030   pH, UA 5.5 5.0 - 7.5   Color, UA Yellow Yellow   Appearance Ur Hazy (A) Clear   Leukocytes,UA Negative Negative   Protein,UA Negative Negative/Trace   Glucose, UA Negative Negative   Ketones, UA Trace (A) Negative   RBC, UA 3+ (A) Negative   Bilirubin, UA Negative Negative   Urobilinogen, Ur 0.2 0.2 - 1.0 mg/dL   Nitrite, UA Negative Negative   Microscopic Examination See below:    Assessment & Plan:   1.  Gross hematuria Risk stratification high based on AUA guidelines We discussed the differential diagnosis for hematuria including nephrolithiasis, renal or upper tract tumors, bladder stones, UTIs, or bladder tumors as well as undetermined etiologies.  Urinalysis today did show >30 RBCs Per AUA guidelines, I did recommend complete hematuria evaluation including CTU, and cystoscopy.   [facility] [address], [place], [place] [address] [phone]  I, Noland Fordyce, am acting as a scribe for Dr. John Giovanni.  I have reviewed the above documentation for accuracy and completeness, and I agree with the above.   Abbie Sons, MD

## 2019-04-20 NOTE — Patient Instructions (Signed)

## 2019-04-21 ENCOUNTER — Other Ambulatory Visit: Payer: Self-pay | Admitting: Urology

## 2019-04-21 DIAGNOSIS — R310 Gross hematuria: Secondary | ICD-10-CM

## 2019-04-26 ENCOUNTER — Other Ambulatory Visit: Payer: Self-pay

## 2019-04-26 ENCOUNTER — Ambulatory Visit: Payer: Medicare Other | Admitting: Physical Therapy

## 2019-04-26 ENCOUNTER — Encounter: Payer: Self-pay | Admitting: Physical Therapy

## 2019-04-26 VITALS — BP 120/78

## 2019-04-26 DIAGNOSIS — M25562 Pain in left knee: Secondary | ICD-10-CM

## 2019-04-26 DIAGNOSIS — R208 Other disturbances of skin sensation: Secondary | ICD-10-CM

## 2019-04-26 DIAGNOSIS — R209 Unspecified disturbances of skin sensation: Secondary | ICD-10-CM | POA: Diagnosis not present

## 2019-04-26 DIAGNOSIS — G8929 Other chronic pain: Secondary | ICD-10-CM | POA: Diagnosis not present

## 2019-04-26 DIAGNOSIS — R262 Difficulty in walking, not elsewhere classified: Secondary | ICD-10-CM

## 2019-04-26 DIAGNOSIS — M25561 Pain in right knee: Secondary | ICD-10-CM | POA: Diagnosis not present

## 2019-04-26 DIAGNOSIS — R2681 Unsteadiness on feet: Secondary | ICD-10-CM

## 2019-04-26 NOTE — Therapy (Signed)
[facility] [address] [place], [place], [address] Phone: [phone]   Fax:  [phone]  Physical Therapy Treatment  Patient Details  Name: Alison [name] MRN: [id number] Date of Birth: 10-05-1945 Referring Provider (PT): Gayland Curry, DO   Encounter Date: 04/26/2019  PT End of Session - 04/26/19 1152    Visit Number  7    Number of Visits  11    Date for PT Re-Evaluation  05/03/19    Authorization Type  Medicare Primary/[facility] Secondary - 10th visit PN    Progress Note Due on Visit  10    PT Start Time  0937    PT Stop Time  1018    PT Time Calculation (min)  41 min    Activity Tolerance  Patient tolerated treatment well;No increased pain    Behavior During Therapy  WFL for tasks assessed/performed       Past Medical History:  Diagnosis Date  . Anxiety   . Benign essential hypertension   . Blood transfusion without reported diagnosis   . Diabetes mellitus without complication ([place])   . Diverticulitis of colon (without mention of hemorrhage)(562.11)   . Goiter, specified as simple   . Hyperlipidemia LDL goal < 100   . Intrahepatic bile duct dilation   . Numbness    TOES RT FOOT and toes left foot  . Obesity, unspecified   . Osteoarthrosis, unspecified whether generalized or localized, lower leg   . Osteopenia   . Other specified disease of nail    FUNGUS OF FINGERNAILS  . Pernicious anemia   . Unspecified glaucoma(365.9)   . Unspecified vitamin D deficiency   . Wears dentures   . Wears glasses     Past Surgical History:  Procedure Laterality Date  . BACK SURGERY    . CATARACT EXTRACTION     right eye 06/20/15 left eye 09/12/15  . COLECTOMY  2009  . COLONOSCOPY  09/06/2007   Dr Carol Ada  . DILATION AND CURETTAGE OF UTERUS    . ENDOSCOPIC RETROGRADE CHOLANGIOPANCREATOGRAPHY (ERCP) WITH PROPOFOL N/A 03/17/2019   Procedure: ENDOSCOPIC RETROGRADE CHOLANGIOPANCREATOGRAPHY (ERCP) WITH PROPOFOL;   Surgeon: Ladene Artist, MD;  Location: [facility] ENDOSCOPY;  Service: Endoscopy;  Laterality: N/A;  . JOINT REPLACEMENT     right  . LUMBAR LAMINECTOMY/DECOMPRESSION MICRODISCECTOMY N/A 03/30/2013   Procedure: CENTRAL DECOMPRESSION L4 - L5 AND EXCISION 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;  Surgeon: Tobi Bastos, MD;  Location: [facility]S;  Service: Orthopedics;  Laterality: N/A;  . MULTIPLE TOOTH EXTRACTIONS    . REMOVAL OF STONES  03/17/2019   Procedure: REMOVAL OF STONES;  Surgeon: Ladene Artist, MD;  Location: [facility] ENDOSCOPY;  Service: Endoscopy;;  . SIGMOIDOSCOPY  02/16/2018   Dr Fuller Plan  . SPHINCTEROTOMY  03/17/2019   Procedure: SPHINCTEROTOMY;  Surgeon: Ladene Artist, MD;  Location: [facility];  Service: Endoscopy;;  . TOTAL KNEE ARTHROPLASTY Right 02/01/2013   Procedure: RIGHT TOTAL KNEE ARTHROPLASTY;  Surgeon: Tobi Bastos, MD;  Location: [facility];  Service: Orthopedics;  Laterality: Right;  . TOTAL KNEE ARTHROPLASTY Left 04/18/2014   Procedure: LEFT TOTAL KNEE ARTHROPLASTY;  Surgeon: Latanya Maudlin, MD;  Location: [facility];  Service: Orthopedics;  Laterality: Left;    Vitals:   04/26/19 0943  BP: 120/78    Subjective Assessment - 04/26/19 0940    Subjective  Went to the doctor for blood in her urine; has plans for scope and CT.  BP at  the doctor was really high and then when she took her BP at home and then at [facility] it was even higher.  Called EMS who checked her BP and it was [facility].   Asking PT to check BP today.  Daughter moved in with patient.    Pertinent History  anxiety, HTN, DM, HLD, diverticulitis, obesity, OA with bilat TKA, osteopenia, glaucoma, colectomy, lumbar laminectomy/decompression and cataract extraction    Patient Stated Goals  Help the pain and help the balance    Currently in Pain?  Yes    Pain Location  Knee    Pain Orientation  Right;Left    Pain Descriptors / Indicators  Aching                       [facility] Adult PT Treatment/Exercise - 04/26/19 0953       Knee/Hip Exercises: Aerobic   Nustep  Level 5 x 6 minutes with bilat UE and LE for aerobic conditioning and UE/LE strengthening and ROM       Knee/Hip Exercises: Standing   Lateral Step Up  Both;1 set;10 reps;Hand Hold: 2;Step Height: 4"      Knee/Hip Exercises: Sidelying   Hip ABduction  Strengthening;Right;Left;2 sets   8 reps open chain   Clams  Reverse clams with pillow between knees, 2 sets x 10 reps, second set with red resistance band added; performed on R and L             PT Education - 04/26/19 1140    Education Details  BP today, schedule more visits    Person(s) Educated  Patient    Methods  Explanation    Comprehension  Verbalized understanding       PT Short Term Goals - 04/05/19 1015      PT SHORT TERM GOAL #1   Title  Pt will demonstrate independence with initial HEP and walking program for aerobic conditioning    Time  5    Period  Weeks    Status  On-going    Target Date  03/29/19      PT SHORT TERM GOAL #2   Title  Pt will decrease time to perform five time sit to stand to </= 17 seconds    Baseline  15 seconds with UE    Time  5    Period  Weeks    Status  Achieved    Target Date  03/29/19      PT SHORT TERM GOAL #3   Title  Pt will increase BERG score to >/= 38/56 to indicate decreased falls risk    Baseline  47/56    Time  5    Period  Weeks    Status  Achieved    Target Date  03/29/19      PT SHORT TERM GOAL #4   Title  Pt will participate in further gait assessment with DGI/stair negotiation assessment    Time  5    Period  Weeks    Status  Achieved    Target Date  03/29/19      PT SHORT TERM GOAL #5   Title  Pt will ambulate x 115' over level indoor surfaces and negotiate around furniture to simulate home environment with cane MOD I without touching walls/furniture for extra support    Time  5    Period  Weeks    Status  On-going    Target Date  03/29/19  PT Long Term Goals - 02/22/19 1344      PT LONG TERM  GOAL #1   Title  Pt will demonstrate independence with final LE strength/balance HEP, biking program and community wellness plan    Time  10    Period  Weeks    Status  New    Target Date  05/03/19      PT LONG TERM GOAL #2   Title  Pt will decrease five time sit to stand to </= 15 seconds from chair    Time  10    Period  Weeks    Status  New    Target Date  05/03/19      PT LONG TERM GOAL #3   Title  Pt will increase BERG to >/= 42/56 to indicate decreased falls risk    Time  10    Period  Weeks    Status  New    Target Date  05/03/19      PT LONG TERM GOAL #4   Title  Pt will increase DGI to >/= 18/24 with cane to indicate decreased risk for falls in community    Baseline  TBD    Time  10    Period  Weeks    Status  New    Target Date  05/03/19      PT LONG TERM GOAL #5   Title  Pt will ambulate x 200' outside over paved surfaces and negotiate curb with cane with supervision    Time  10    Period  Weeks    Status  New    Target Date  05/03/19            Plan - 04/26/19 1153    Clinical Impression Statement  Patient demonstrating improved BP today.  Continued to focus on aerobic conditioning and initiated glute med, hip ABD open and closed chain strengthening to improve standing balance and decrease strain on knee joints.  Pt tolerated well with no increase in knee pain; will continue to progress towards LTG.    Personal Factors and Comorbidities  Comorbidity 3+;Fitness;Social Background;Transportation    Comorbidities  anxiety, HTN, DM, HLD, diverticulitis, obesity, OA with bilat TKA, osteopenia, glaucoma, colectomy, lumbar laminectomy/decompression and cataract extraction    Examination-Activity Limitations  Caring for Others;Locomotion Level;Stairs;Stand    Examination-Participation Restrictions  Cleaning;Community Activity;Laundry;Meal Prep    Stability/Clinical Decision Making  Evolving/Moderate complexity    Rehab Potential  Good    PT Frequency  1x / week     PT Duration  Other (comment)   10 weeks   PT Treatment/Interventions  ADLs/Self Care Home Management;Aquatic Therapy;Electrical Stimulation;Cryotherapy;Moist Heat;DME Instruction;Gait training;Stair training;Functional mobility training;Therapeutic activities;Therapeutic exercise;Balance training;Neuromuscular re-education;Patient/family education;Orthotic Fit/Training;Passive range of motion;Dry needling;Taping    PT Next Visit Plan  Check LTG and recert for 1x/week x 8; Warm up on Nustep, levle 6 x 6-8 minutes.  Really focus on hip ABD strength-step ups laterally and forwards!!  Progress standing balance HEP -  LE stretching, strengthening, sit <> stand, standing balance with narrow BOS, compliant surface, eyes closed, SLS training    Consulted and Agree with Plan of Care  Patient       Patient will benefit from skilled therapeutic intervention in order to improve the following deficits and impairments:  Decreased balance, Decreased activity tolerance, Decreased strength, Difficulty walking, Impaired sensation, Pain  Visit Diagnosis: Other disturbances of skin sensation  Unsteadiness on feet  Difficulty in walking, not elsewhere classified  Chronic  pain of right knee  Chronic pain of left knee     Problem List Patient Active Problem List   Diagnosis Date Noted  . Calculus of bile duct without cholangitis with obstruction   . Renal calcification 03/10/2019  . Tobacco use disorder, continuous 03/10/2019  . Transaminitis 02/04/2019  . Abnormal ultrasound of liver 02/04/2019  . Sessile rectal polyp 05/02/2018  . Internal hemorrhoids without complication [id number]  . Diverticulosis 05/02/2018  . 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 Chronic pain of both knees 08/19/2017  . Cataracts, bilateral 07/08/2015  . Hyperlipidemia associated with type 2 diabetes mellitus ([place]) 04/27/2015  . History of total knee arthroplasty 04/18/2014  . History of lumbar  laminectomy for spinal cord decompression 05/04/2013  . Synovial cyst of lumbar spine 03/31/2013  . Spinal stenosis of lumbar region with neurogenic claudication 03/30/2013  . Synovial cyst of lumbar facet joint 03/30/2013  . Type 2 diabetes mellitus with neurological complications ([place]) [id number]  . Osteoarthritis of right knee 02/01/2013  . History of total knee replacement 02/01/2013  . Tobacco abuse 10/03/2012  . Hyperglycemia 10/03/2012  . Unspecified vitamin D deficiency   . Pernicious anemia   . Glaucoma (increased eye pressure)   . Benign essential hypertension   . Osteopenia     Rico Junker, PT, DPT 04/26/19    11:57 AM    [facility] [address] [place], [place], [address] Phone: [phone]   Fax:  [phone]  Name: Alean Mishkin MRN: [id number] Date of Birth: 11-25-1945

## 2019-05-01 ENCOUNTER — Ambulatory Visit (INDEPENDENT_AMBULATORY_CARE_PROVIDER_SITE_OTHER): Payer: Medicare Other | Admitting: Gastroenterology

## 2019-05-01 ENCOUNTER — Encounter: Payer: Self-pay | Admitting: Gastroenterology

## 2019-05-01 VITALS — BP 134/84 | HR 66 | Temp 96.8°F | Ht 65.0 in | Wt 208.1 lb

## 2019-05-01 DIAGNOSIS — K8021 Calculus of gallbladder without cholecystitis with obstruction: Secondary | ICD-10-CM

## 2019-05-01 DIAGNOSIS — Z86010 Personal history of colonic polyps: Secondary | ICD-10-CM

## 2019-05-01 NOTE — Patient Instructions (Signed)
Normal BMI (Body Mass Index- based on height and weight) is between 23 and 30. Your BMI today is Body mass index is 34.63 kg/m. Marland Kitchen Please consider follow up  regarding your BMI with your Primary Care Provider.   Thank you for choosing me and [facility].  Pricilla Riffle. Dagoberto Ligas., MD., Marval Regal

## 2019-05-01 NOTE — Progress Notes (Signed)
    History of Present Illness: This is a 74 year old female following up post ERCP sphincterotomy for choledocholithiasis on 3/5. She has done well since. No GI complaints.   Current Medications, Allergies, Past Medical History, Past Surgical History, Family History and Social History were reviewed in [employer] record.   Physical Exam: General: Well developed, well nourished, no acute distress Head: Normocephalic and atraumatic Eyes:  sclerae anicteric, EOMI Ears: Normal auditory acuity Mouth: Not examined, mask on during Covid-19 pandemic Neurological: Alert oriented x 4, grossly nonfocal Psychological:  Alert and cooperative. Normal mood and affect   Assessment and Recommendations:  1. Cholelithiasis, asymptomatic.  Expectant management.  Surgical referral if symptoms occur.  Common symptoms were discussed with the patient.  2. Choledocholithiasis, S/P ERCP with removal.  GI follow-up as needed.  3. Status post subtotal colectomy in 2009.  4. Personal history of adenomatous colon polyp on flexible sigmoidoscopy in February 2020.  No plans for future surveillance flexible sigmoidoscopy.

## 2019-05-03 ENCOUNTER — Other Ambulatory Visit: Payer: Self-pay

## 2019-05-03 ENCOUNTER — Encounter: Payer: Self-pay | Admitting: Physical Therapy

## 2019-05-03 ENCOUNTER — Other Ambulatory Visit: Payer: Self-pay | Admitting: Urology

## 2019-05-03 ENCOUNTER — Ambulatory Visit: Payer: Medicare Other | Admitting: Physical Therapy

## 2019-05-03 VITALS — BP 146/90

## 2019-05-03 DIAGNOSIS — M25562 Pain in left knee: Secondary | ICD-10-CM

## 2019-05-03 DIAGNOSIS — R2681 Unsteadiness on feet: Secondary | ICD-10-CM | POA: Diagnosis not present

## 2019-05-03 DIAGNOSIS — R262 Difficulty in walking, not elsewhere classified: Secondary | ICD-10-CM | POA: Diagnosis not present

## 2019-05-03 DIAGNOSIS — G8929 Other chronic pain: Secondary | ICD-10-CM

## 2019-05-03 DIAGNOSIS — M25561 Pain in right knee: Secondary | ICD-10-CM | POA: Diagnosis not present

## 2019-05-03 DIAGNOSIS — R208 Other disturbances of skin sensation: Secondary | ICD-10-CM

## 2019-05-03 DIAGNOSIS — R209 Unspecified disturbances of skin sensation: Secondary | ICD-10-CM | POA: Diagnosis not present

## 2019-05-03 NOTE — Therapy (Addendum)
[facility] [address] [place], [place], [address] Phone: [phone]   Fax:  [phone]  Physical Therapy Treatment  Patient Details  Name: Alison [name] MRN: [id number] Date of Birth: [birth year] Referring Provider (PT): Gayland Curry, DO   Encounter Date: 05/03/2019  PT End of Session - 05/03/19 0935    Visit Number  8    Number of Visits  11    Date for PT Re-Evaluation  05/03/19    Authorization Type  Medicare Primary/[facility] Secondary - 10th visit PN    Progress Note Due on Visit  10    PT Start Time  0932    PT Stop Time  1013    PT Time Calculation (min)  41 min    Equipment Utilized During Treatment  Gait belt    Activity Tolerance  Patient tolerated treatment well;No increased pain    Behavior During Therapy  WFL for tasks assessed/performed       Past Medical History:  Diagnosis Date  . Anxiety   . Benign essential hypertension   . Blood transfusion without reported diagnosis   . Diabetes mellitus without complication ([place])   . Diverticulitis of colon (without mention of hemorrhage)(562.11)   . Goiter, specified as simple   . Hyperlipidemia LDL goal < 100   . Intrahepatic bile duct dilation   . Numbness    TOES RT FOOT and toes left foot  . Obesity, unspecified   . Osteoarthrosis, unspecified whether generalized or localized, lower leg   . Osteopenia   . Other specified disease of nail    FUNGUS OF FINGERNAILS  . Pernicious anemia   . Unspecified glaucoma(365.9)   . Unspecified vitamin D deficiency   . Wears dentures   . Wears glasses     Past Surgical History:  Procedure Laterality Date  . BACK SURGERY    . CATARACT EXTRACTION     right eye 06/20/15 left eye 09/12/15  . COLECTOMY  2009  . COLONOSCOPY  09/06/2007   Dr Carol Ada  . DILATION AND CURETTAGE OF UTERUS    . ENDOSCOPIC RETROGRADE CHOLANGIOPANCREATOGRAPHY (ERCP) WITH PROPOFOL N/A 03/17/2019   Procedure: ENDOSCOPIC RETROGRADE  CHOLANGIOPANCREATOGRAPHY (ERCP) WITH PROPOFOL;  Surgeon: Ladene Artist, MD;  Location: [facility] ENDOSCOPY;  Service: Endoscopy;  Laterality: N/A;  . JOINT REPLACEMENT     right  . LUMBAR LAMINECTOMY/DECOMPRESSION MICRODISCECTOMY N/A 03/30/2013   Procedure: CENTRAL DECOMPRESSION L4 - L5 AND EXCISION 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;  Surgeon: Tobi Bastos, MD;  Location: [facility];  Service: Orthopedics;  Laterality: N/A;  . MULTIPLE TOOTH EXTRACTIONS    . REMOVAL OF STONES  03/17/2019   Procedure: REMOVAL OF STONES;  Surgeon: Ladene Artist, MD;  Location[facility] ENDOSCOPY;  Service: Endoscopy;;  . SIGMOIDOSCOPY  02/16/2018   Dr Fuller Plan  . SPHINCTEROTOMY  03/17/2019   Procedure: SPHINCTEROTOMY;  Surgeon: Ladene Artist, MD;  Location: [facility];  Service: Endoscopy;;  . TOTAL KNEE ARTHROPLASTY Right 02/01/2013   Procedure: RIGHT TOTAL KNEE ARTHROPLASTY;  Surgeon: Tobi Bastos, MD;  Location: [facility];  Service: Orthopedics;  Laterality: Right;  . TOTAL KNEE ARTHROPLASTY Left 04/18/2014   Procedure: LEFT TOTAL KNEE ARTHROPLASTY;  Surgeon: Latanya Maudlin, MD;  Location: [facility];  Service: Orthopedics;  Laterality: Left;    Vitals:   05/03/19 0934  BP: (!) 146/90    Subjective Assessment - 05/03/19 0934    Subjective  No new complaints. No falls or pain  to report.    Pertinent History  anxiety, HTN, DM, HLD, diverticulitis, obesity, OA with bilat TKA, osteopenia, glaucoma, colectomy, lumbar laminectomy/decompression and cataract extraction    Patient Stated Goals  Help the pain and help the balance    Currently in Pain?  Yes    Pain Score  5     Pain Location  Knee    Pain Orientation  Right;Left    Pain Descriptors / Indicators  Aching;Sore    Pain Type  Chronic pain    Pain Onset  More than a month ago    Pain Frequency  Constant   varies in intensity   Aggravating Factors   unknown    Pain Relieving Factors  tylenol, icy hot and riding her bike         [facility] PT Assessment - 05/03/19  0938      Transfers   Transfers  Sit to Stand;Stand to Sit    Sit to Stand  5: Supervision;With upper extremity assist    Stand to Sit  5: Supervision;With upper extremity assist      Ambulation/Gait   Ambulation/Gait  Yes    Ambulation/Gait Assistance  6: Modified independent (Device/Increase time);4: Min guard    Ambulation/Gait Assistance Details  Mod I with gait outdoors and into/out of gym with cane. min guard to supervision for gait with no AD, especially with testing.     Ambulation Distance (Feet)  300 Feet   x1 in/outdoors, plus around gym with session   Assistive device  Straight cane;None    Gait Pattern  Step-through pattern;Trendelenburg;Antalgic    Ambulation Surface  Level;Unlevel;Indoor;Outdoor;Paved      Standardized Balance Assessment   Standardized Balance Assessment  Berg Balance Test;Five Times Sit to Stand;Dynamic Gait Index    Five times sit to stand comments   12.60 sec's with hands on knees using standard height chair, knee pain reported.       Berg Balance Test   Sit to Stand  Able to stand without using hands and stabilize independently    Standing Unsupported  Able to stand safely 2 minutes    Sitting with Back Unsupported but Feet Supported on Floor or Stool  Able to sit safely and securely 2 minutes    Stand to Sit  Sits safely with minimal use of hands    Transfers  Able to transfer safely, minor use of hands    Standing Unsupported with Eyes Closed  Able to stand 10 seconds safely    Standing Unsupported with Feet Together  Able to place feet together independently and stand 1 minute safely    From Standing, Reach Forward with Outstretched Arm  Can reach confidently >25 cm (10")    From Standing Position, Pick up Object from Floor  Able to pick up shoe safely and easily    From Standing Position, Turn to Look Behind Over each Shoulder  Looks behind from both sides and weight shifts well    Turn 360 Degrees  Able to turn 360 degrees safely but slowly    > 8 sec's   Standing Unsupported, Alternately Place Feet on Step/Stool  Able to stand independently and complete 8 steps >20 seconds   > 20 sec's   Standing Unsupported, One Foot in Front  Able to plae foot ahead of the other independently and hold 30 seconds    Standing on One Leg  Tries to lift leg/unable to hold 3 seconds but remains standing independently    Total  Score  49    Berg comment:  49/56 moderate risk for falls      Dynamic Gait Index   Level Surface  Normal   mildly antalgic, no imbalance   Change in Gait Speed  Normal    Gait with Horizontal Head Turns  Mild Impairment    Gait with Vertical Head Turns  Mild Impairment    Gait and Pivot Turn  Normal    Step Over Obstacle  Moderate Impairment    Step Around Obstacles  Normal    Steps  Moderate Impairment    Total Score  18    DGI comment:  18/24            PT Short Term Goals - 04/05/19 1015      PT SHORT TERM GOAL #1   Title  Pt will demonstrate independence with initial HEP and walking program for aerobic conditioning    Time  5    Period  Weeks    Status  On-going    Target Date  03/29/19      PT SHORT TERM GOAL #2   Title  Pt will decrease time to perform five time sit to stand to </= 17 seconds    Baseline  15 seconds with UE    Time  5    Period  Weeks    Status  Achieved    Target Date  03/29/19      PT SHORT TERM GOAL #3   Title  Pt will increase BERG score to >/= 38/56 to indicate decreased falls risk    Baseline  47/56    Time  5    Period  Weeks    Status  Achieved    Target Date  03/29/19      PT SHORT TERM GOAL #4   Title  Pt will participate in further gait assessment with DGI/stair negotiation assessment    Time  5    Period  Weeks    Status  Achieved    Target Date  03/29/19      PT SHORT TERM GOAL #5   Title  Pt will ambulate x 115' over level indoor surfaces and negotiate around furniture to simulate home environment with cane MOD I without touching walls/furniture for  extra support    Time  5    Period  Weeks    Status  On-going    Target Date  03/29/19        PT Long Term Goals - 05/03/19 0936      PT LONG TERM GOAL #1   Title  Pt will demonstrate independence with final LE strength/balance HEP, biking program and community wellness plan    Baseline  05/03/19: met with currrent program, will need updating as pt progresses    Status  Achieved      PT LONG TERM GOAL #2   Title  Pt will decrease five time sit to stand to </= 15 seconds from chair    Baseline  05/03/19; 12.60 sec's with hands on knees from standard height chair    Time  --    Period  --    Status  Achieved      PT LONG TERM GOAL #3   Title  Pt will increase BERG to >/= 42/56 to indicate decreased falls risk    Baseline  05/03/19: 49/56 scored today    Time  --    Period  --    Status  Achieved  PT LONG TERM GOAL #4   Title  Pt will increase DGI to >/= 18/24 with cane to indicate decreased risk for falls in community    Baseline  05/03/19: 18/24 scored today    Time  --    Period  --    Status  Achieved      PT LONG TERM GOAL #5   Title  Pt will ambulate x 200' outside over paved surfaces and negotiate curb with cane with supervision    Baseline  05/03/19: met in session today    Time  --    Period  --    Status  Achieved        New goals for recertification: PT Short Term Goals - 05/09/19 1535      PT SHORT TERM GOAL #1   Title  Pt will demonstrate independence with ongoing HEP and walking program for aerobic conditioning    Time  4    Period  Weeks    Status  Revised    Target Date  06/07/19      PT SHORT TERM GOAL #2   Title  Pt will decrease time to perform five time sit to stand to </= 17 seconds    Baseline  Five time sit to stand is now [facility]: 12 seconds    Status  Achieved      PT SHORT TERM GOAL #3   Title  Pt will increase BERG score to >/= 51/56 to indicate decreased falls risk    Baseline  49/56    Time  4    Period  Weeks    Status  Revised     Target Date  06/07/19      PT SHORT TERM GOAL #4   Title  Pt will improve DGI to >/= 20/24    Baseline  18/24    Time  4    Period  Weeks    Status  Revised    Target Date  06/07/19      PT SHORT TERM GOAL #5   Title  Pt will ambulate x 500' over paved outdoor surfaces, negotiate curb with cane and supervision    Time  4    Period  Weeks    Status  Revised    Target Date  06/07/19      PT Long Term Goals - 05/09/19 1538      PT LONG TERM GOAL #1   Title  Pt will demonstrate independence with final LE strength/balance HEP, biking program and community wellness plan    Time  8    Period  Weeks    Status  Revised    Target Date  07/07/19      PT LONG TERM GOAL #3   Title  Pt will increase BERG to >/= 53/56 to indicate decreased falls risk    Time  8    Period  Weeks    Status  Revised    Target Date  07/07/19      PT LONG TERM GOAL #4   Title  Pt will increase DGI to >/= 22/24 with cane to indicate decreased risk for falls in community    Time  8    Period  Weeks    Status  Revised    Target Date  07/07/19      PT LONG TERM GOAL #5   Title  Pt will negotiate ramp and 5 stairs with cane MOD I for safe home entry/exit  Baseline  --    Time  8    Period  Weeks    Status  Revised    Target Date  07/07/19      Rico Junker, PT, DPT 05/09/19    3:42 PM            05/03/19 0936  Plan  Clinical Impression Statement Today's skilled session focused on progress toward LTGs with all goals met. Primary PT plans to recert and will reset goals.  - Addendum by primary PT: pt is making good progress and has met all LTG.  Pt continues to present with impaired LE functional strength and impaired balance due to peripheral neuropathy.  Per BERG and DGI pt is a moderate risk for falls.  Pt will benefit from continued skilled PT services to address these impairments to continue to maximize functional mobility independence and decrease falls risk.  Personal Factors and  Comorbidities Comorbidity 3+;Fitness;Social Background;Transportation  Comorbidities anxiety, HTN, DM, HLD, diverticulitis, obesity, OA with bilat TKA, osteopenia, glaucoma, colectomy, lumbar laminectomy/decompression and cataract extraction  Examination-Activity Limitations Caring for Others;Locomotion Level;Stairs;Stand  Examination-Participation Restrictions Cleaning;Community Activity;Laundry;Meal Prep  Pt will benefit from skilled therapeutic intervention in order to improve on the following deficits Decreased balance;Decreased activity tolerance;Decreased strength;Difficulty walking;Impaired sensation;Pain  Rehab Potential Good  PT Frequency 1x / week  PT Duration 8 weeks  PT Treatment/Interventions ADLs/Self Care Home Management;Aquatic Therapy;Electrical Stimulation;Cryotherapy;Moist Heat;DME Instruction;Gait training;Stair training;Functional mobility training;Therapeutic activities;Therapeutic exercise;Balance training;Neuromuscular re-education;Patient/family education;Orthotic Fit/Training;Passive range of motion;Dry needling;Taping  PT Next Visit Plan Warm up on Nustep, levle 6 x 6-8 minutes.  Really focus on hip ABD strength-step ups laterally and forwards!!  Progress standing balance HEP -  LE stretching, strengthening, sit <> stand, standing balance with narrow BOS, compliant surface, eyes closed, SLS training  Consulted and Agree with Plan of Care Patient    Patient will benefit from skilled therapeutic intervention in order to improve the following deficits and impairments:  Decreased balance, Decreased activity tolerance, Decreased strength, Difficulty walking, Impaired sensation, Pain  Visit Diagnosis: Other disturbances of skin sensation  Unsteadiness on feet  Difficulty in walking, not elsewhere classified  Chronic pain of right knee  Chronic pain of left knee     Problem List Patient Active Problem List   Diagnosis Date Noted  . Calculus of bile duct without  cholangitis with obstruction   . Renal calcification 03/10/2019  . Tobacco use disorder, continuous 03/10/2019  . Transaminitis 02/04/2019  . Abnormal ultrasound of liver 02/04/2019  . Sessile rectal polyp 05/02/2018  . Internal hemorrhoids without complication 40/08/6759  . Diverticulosis 05/02/2018  . 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 Chronic pain of both knees 08/19/2017  . Cataracts, bilateral 07/08/2015  . Hyperlipidemia associated with type 2 diabetes mellitus ([place]) 04/27/2015  . History of total knee arthroplasty 04/18/2014  . History of lumbar laminectomy for spinal cord decompression 05/04/2013  . Synovial cyst of lumbar spine 03/31/2013  . Spinal stenosis of lumbar region with neurogenic claudication 03/30/2013  . Synovial cyst of lumbar facet joint 03/30/2013  . Type 2 diabetes mellitus with neurological complications ([place]) 95/09/3265  . Osteoarthritis of right knee 02/01/2013  . History of total knee replacement 02/01/2013  . Tobacco abuse 10/03/2012  . Hyperglycemia 10/03/2012  . Unspecified vitamin D deficiency   . Pernicious anemia   . Glaucoma (increased eye pressure)   . Benign essential hypertension   . Osteopenia     Willow Ora, PTA, CLT [facility] [address], [place], [place] [address] [phone] 05/03/19, 4:19 PM   Addendum: Rico Junker, PT, DPT 05/09/19    3:42 PM     Name: Evalise Abruzzese MRN: [id number] Date of Birth: 03/28/1945

## 2019-05-09 NOTE — Addendum Note (Signed)
Addended by: Rico Junker on: 05/09/2019 03:45 PM   Modules accepted: Orders

## 2019-05-10 ENCOUNTER — Other Ambulatory Visit: Payer: Self-pay

## 2019-05-10 ENCOUNTER — Ambulatory Visit: Payer: Medicare Other | Admitting: Physical Therapy

## 2019-05-10 ENCOUNTER — Encounter: Payer: Self-pay | Admitting: Physical Therapy

## 2019-05-10 DIAGNOSIS — G8929 Other chronic pain: Secondary | ICD-10-CM

## 2019-05-10 DIAGNOSIS — M25561 Pain in right knee: Secondary | ICD-10-CM | POA: Diagnosis not present

## 2019-05-10 DIAGNOSIS — R2681 Unsteadiness on feet: Secondary | ICD-10-CM | POA: Diagnosis not present

## 2019-05-10 DIAGNOSIS — R209 Unspecified disturbances of skin sensation: Secondary | ICD-10-CM | POA: Diagnosis not present

## 2019-05-10 DIAGNOSIS — R208 Other disturbances of skin sensation: Secondary | ICD-10-CM

## 2019-05-10 DIAGNOSIS — R262 Difficulty in walking, not elsewhere classified: Secondary | ICD-10-CM

## 2019-05-10 DIAGNOSIS — M25562 Pain in left knee: Secondary | ICD-10-CM | POA: Diagnosis not present

## 2019-05-11 NOTE — Therapy (Signed)
[facility] [address] [place], [place], [address] Phone: [phone]   Fax:  [phone]  Physical Therapy Treatment  Patient Details  Name: Alison [name] MRN: [id number] Date of Birth: Apr 11, 1945 Referring Provider (PT): Gayland Curry, DO   Encounter Date: 05/10/2019  PT End of Session - 05/10/19 0936    Visit Number  9    Number of Visits  11    Date for PT Re-Evaluation  05/03/19    Authorization Type  Medicare Primary/[facility]I Secondary - 10th visit PN    Progress Note Due on Visit  10    PT Start Time  0932    PT Stop Time  1012    PT Time Calculation (min)  40 min    Equipment Utilized During Treatment  Gait belt    Activity Tolerance  Patient tolerated treatment well;No increased pain    Behavior During Therapy  WFL for tasks assessed/performed       Past Medical History:  Diagnosis Date  . Anxiety   . Benign essential hypertension   . Blood transfusion without reported diagnosis   . Diabetes mellitus without complication ([place])   . Diverticulitis of colon (without mention of hemorrhage)(562.11)   . Goiter, specified as simple   . Hyperlipidemia LDL goal < 100   . Intrahepatic bile duct dilation   . Numbness    TOES RT FOOT and toes left foot  . Obesity, unspecified   . Osteoarthrosis, unspecified whether generalized or localized, lower leg   . Osteopenia   . Other specified disease of nail    FUNGUS OF FINGERNAILS  . Pernicious anemia   . Unspecified glaucoma(365.9)   . Unspecified vitamin D deficiency   . Wears dentures   . Wears glasses     Past Surgical History:  Procedure Laterality Date  . BACK SURGERY    . CATARACT EXTRACTION     right eye 06/20/15 left eye 09/12/15  . COLECTOMY  2009  . COLONOSCOPY  09/06/2007   Dr Carol Ada  . DILATION AND CURETTAGE OF UTERUS    . ENDOSCOPIC RETROGRADE CHOLANGIOPANCREATOGRAPHY (ERCP) WITH PROPOFOL N/A 03/17/2019   Procedure: ENDOSCOPIC RETROGRADE  CHOLANGIOPANCREATOGRAPHY (ERCP) WITH PROPOFOL;  Surgeon: Ladene Artist, MD;  Location: [facility] ENDOSCOPY;  Service: Endoscopy;  Laterality: N/A;  . JOINT REPLACEMENT     right  . LUMBAR LAMINECTOMY/DECOMPRESSION MICRODISCECTOMY N/A 03/30/2013   Procedure: CENTRAL DECOMPRESSION L4 - L5 AND EXCISION 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;  Surgeon: Tobi Bastos, MD;  Location: [facility];  Service: Orthopedics;  Laterality: N/A;  . MULTIPLE TOOTH EXTRACTIONS    . REMOVAL OF STONES  03/17/2019   Procedure: REMOVAL OF STONES;  Surgeon: Ladene Artist, MD;  Location: [facility] ENDOSCOPY;  Service: Endoscopy;;  . SIGMOIDOSCOPY  02/16/2018   Dr Fuller Plan  . SPHINCTEROTOMY  03/17/2019   Procedure: SPHINCTEROTOMY;  Surgeon: Ladene Artist, MD;  Location: [facility];  Service: Endoscopy;;  . TOTAL KNEE ARTHROPLASTY Right 02/01/2013   Procedure: RIGHT TOTAL KNEE ARTHROPLASTY;  Surgeon: Tobi Bastos, MD;  Location: [facility];  Service: Orthopedics;  Laterality: Right;  . TOTAL KNEE ARTHROPLASTY Left 04/18/2014   Procedure: LEFT TOTAL KNEE ARTHROPLASTY;  Surgeon: Latanya Maudlin, MD;  Location: [facility];  Service: Orthopedics;  Laterality: Left;    There were no vitals filed for this visit.  Subjective Assessment - 05/10/19 0935    Subjective  No new complaints. No falls. When asked about pain "not  really, just a little swollen and tight" referring to her left knee.    Pertinent History  anxiety, HTN, DM, HLD, diverticulitis, obesity, OA with bilat TKA, osteopenia, glaucoma, colectomy, lumbar laminectomy/decompression and cataract extraction    Patient Stated Goals  Help the pain and help the balance    Currently in Pain?  No/denies    Pain Score  0-No pain           [facility] Adult PT Treatment/Exercise - 05/10/19 0937      Transfers   Transfers  Sit to Stand;Stand to Sit    Sit to Stand  5: Supervision;With upper extremity assist    Stand to Sit  5: Supervision;With upper extremity assist      Ambulation/Gait    Ambulation/Gait  Yes    Ambulation/Gait Assistance  6: Modified independent (Device/Increase time);5: Supervision    Ambulation/Gait Assistance Details  Mod I with cane. supervision for gait with no AD.     Assistive device  Straight cane;None    Gait Pattern  Step-through pattern;Trendelenburg;Antalgic    Ambulation Surface  Level;Indoor      Knee/Hip Exercises: Aerobic   Nustep  Level 5 x 6 minutes with bilat UE and LE for aerobic conditioning and UE/LE strengthening and ROM       Knee/Hip Exercises: Standing   Lateral Step Up  Both;1 set;10 reps;Hand Hold: 0;Step Height: 6";Limitations    Lateral Step Up Limitations  cues on form and technique    Forward Step Up  Both;1 set;10 reps;Hand Hold: 0;Step Height: 6";Limitations    Forward Step Up Limitations  cues on form and technique    Other Standing Knee Exercises  with red band 4 way hip kicks for 10 reps each direction bil LE's with 2 UE support for balance. cues for posture and ex form.     Other Standing Knee Exercises  sit<>stands while keeping red band pulled tight for 2 sets of 5 reps. min guard assist for safety with standing balance.            PT Short Term Goals - 05/09/19 1535      PT SHORT TERM GOAL #1   Title  Pt will demonstrate independence with ongoing HEP and walking program for aerobic conditioning    Time  4    Period  Weeks    Status  Revised    Target Date  06/07/19      PT SHORT TERM GOAL #2   Title  Pt will decrease time to perform five time sit to stand to </= 17 seconds    Baseline  Five time sit to stand is now [facility]: 12 seconds    Status  Achieved      PT SHORT TERM GOAL #3   Title  Pt will increase BERG score to >/= 51/56 to indicate decreased falls risk    Baseline  49/56    Time  4    Period  Weeks    Status  Revised    Target Date  06/07/19      PT SHORT TERM GOAL #4   Title  Pt will improve DGI to >/= 20/24    Baseline  18/24    Time  4    Period  Weeks    Status  Revised    Target  Date  06/07/19      PT SHORT TERM GOAL #5   Title  Pt will ambulate x 500' over paved outdoor surfaces, negotiate curb with  cane and supervision    Time  4    Period  Weeks    Status  Revised    Target Date  06/07/19        PT Long Term Goals - 05/09/19 1538      PT LONG TERM GOAL #1   Title  Pt will demonstrate independence with final LE strength/balance HEP, biking program and community wellness plan    Time  8    Period  Weeks    Status  Revised    Target Date  07/07/19      PT LONG TERM GOAL #3   Title  Pt will increase BERG to >/= 53/56 to indicate decreased falls risk    Time  8    Period  Weeks    Status  Revised    Target Date  07/07/19      PT LONG TERM GOAL #4   Title  Pt will increase DGI to >/= 22/24 with cane to indicate decreased risk for falls in community    Time  8    Period  Weeks    Status  Revised    Target Date  07/07/19      PT LONG TERM GOAL #5   Title  Pt will negotiate ramp and 5 stairs with cane MOD I for safe home entry/exit    Baseline  --    Time  8    Period  Weeks    Status  Revised    Target Date  07/07/19            Plan - 05/10/19 [id number]    Clinical Impression Statement  Today's skilled session focused on LE strengthening, with emphasis on hip strengthening. No issues reported or noted in session. The pt is progressing toward goals and should benefit from continued PT to progress toward unmet goals.    Personal Factors and Comorbidities  Comorbidity 3+;Fitness;Social Background;Transportation    Comorbidities  anxiety, HTN, DM, HLD, diverticulitis, obesity, OA with bilat TKA, osteopenia, glaucoma, colectomy, lumbar laminectomy/decompression and cataract extraction    Examination-Activity Limitations  Caring for Others;Locomotion Level;Stairs;Stand    Examination-Participation Restrictions  Cleaning;Community Activity;Laundry;Meal Prep    Rehab Potential  Good    PT Frequency  1x / week    PT Duration  8 weeks    PT  Treatment/Interventions  ADLs/Self Care Home Management;Aquatic Therapy;Electrical Stimulation;Cryotherapy;Moist Heat;DME Instruction;Gait training;Stair training;Functional mobility training;Therapeutic activities;Therapeutic exercise;Balance training;Neuromuscular re-education;Patient/family education;Orthotic Fit/Training;Passive range of motion;Dry needling;Taping    PT Next Visit Plan  Warm up on Nustep, levle 6 x 6-8 minutes.  Really focus on hip ABD strength-step ups laterally and forwards!!  Progress standing balance HEP -  LE stretching, strengthening, sit <> stand, standing balance with narrow BOS, compliant surface, eyes closed, SLS training    PT Home Exercise Plan  Access Code: [email or web address]    Consulted and Agree with Plan of Care  Patient       Patient will benefit from skilled therapeutic intervention in order to improve the following deficits and impairments:  Decreased balance, Decreased activity tolerance, Decreased strength, Difficulty walking, Impaired sensation, Pain  Visit Diagnosis: Other disturbances of skin sensation  Unsteadiness on feet  Difficulty in walking, not elsewhere classified  Chronic pain of right knee  Chronic pain of left knee     Problem List Patient Active Problem List   Diagnosis Date Noted  . Calculus of bile duct without cholangitis with obstruction   . Renal calcification 03/10/2019  .  Tobacco use disorder, continuous 03/10/2019  . Transaminitis 02/04/2019  . Abnormal ultrasound of liver 02/04/2019  . Sessile rectal polyp 05/02/2018  . Internal hemorrhoids without complication 99991111  . Diverticulosis 05/02/2018  . 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 Chronic pain of both knees 08/19/2017  . Cataracts, bilateral 07/08/2015  . Hyperlipidemia associated with type 2 diabetes mellitus ([place]) 04/27/2015  . History of total knee arthroplasty 04/18/2014  . History of lumbar laminectomy for spinal cord decompression  05/04/2013  . Synovial cyst of lumbar spine 03/31/2013  . Spinal stenosis of lumbar region with neurogenic claudication 03/30/2013  . Synovial cyst of lumbar facet joint 03/30/2013  . Type 2 diabetes mellitus with neurological complications ([place]) [id number]  . Osteoarthritis of right knee 02/01/2013  . History of total knee replacement 02/01/2013  . Tobacco abuse 10/03/2012  . Hyperglycemia 10/03/2012  . Unspecified vitamin D deficiency   . Pernicious anemia   . Glaucoma (increased eye pressure)   . Benign essential hypertension   . Osteopenia    Willow Ora, PTA, [facility] [address], [place], [place] [address] [phone] 05/11/19, 5:23 PM   Name: Alison [name] MRN: [id number] Date of Birth: 30-May-1945

## 2019-05-12 ENCOUNTER — Other Ambulatory Visit: Payer: Self-pay

## 2019-05-12 ENCOUNTER — Ambulatory Visit
Admission: RE | Admit: 2019-05-12 | Discharge: 2019-05-12 | Disposition: A | Discharge status: Home / Self Care | Payer: Medicare Other | Source: Ambulatory Visit | Attending: Urology | Admitting: Urology

## 2019-05-12 DIAGNOSIS — K8020 Calculus of gallbladder without cholecystitis without obstruction: Secondary | ICD-10-CM | POA: Diagnosis not present

## 2019-05-12 DIAGNOSIS — D259 Leiomyoma of uterus, unspecified: Secondary | ICD-10-CM | POA: Diagnosis not present

## 2019-05-12 DIAGNOSIS — R310 Gross hematuria: Secondary | ICD-10-CM | POA: Diagnosis not present

## 2019-05-12 MED ORDER — IOPAMIDOL (ISOVUE-300) INJECTION 61%
125.0000 mL | Freq: Once | INTRAVENOUS | Status: AC | PRN
  Administered 2019-05-12: 125 mL via INTRAVENOUS

## 2019-05-16 ENCOUNTER — Encounter: Payer: Self-pay | Admitting: Internal Medicine

## 2019-05-16 DIAGNOSIS — Z961 Presence of intraocular lens: Secondary | ICD-10-CM | POA: Diagnosis not present

## 2019-05-16 DIAGNOSIS — H401131 Primary open-angle glaucoma, bilateral, mild stage: Secondary | ICD-10-CM | POA: Diagnosis not present

## 2019-05-16 DIAGNOSIS — E119 Type 2 diabetes mellitus without complications: Secondary | ICD-10-CM | POA: Diagnosis not present

## 2019-05-16 DIAGNOSIS — H26493 Other secondary cataract, bilateral: Secondary | ICD-10-CM | POA: Diagnosis not present

## 2019-05-16 DIAGNOSIS — H35033 Hypertensive retinopathy, bilateral: Secondary | ICD-10-CM | POA: Diagnosis not present

## 2019-05-16 LAB — HM DIABETES EYE EXAM

## 2019-05-17 ENCOUNTER — Other Ambulatory Visit: Payer: Self-pay

## 2019-05-17 ENCOUNTER — Other Ambulatory Visit: Payer: Medicare Other

## 2019-05-17 DIAGNOSIS — E1149 Type 2 diabetes mellitus with other diabetic neurological complication: Secondary | ICD-10-CM | POA: Diagnosis not present

## 2019-05-17 DIAGNOSIS — E1169 Type 2 diabetes mellitus with other specified complication: Secondary | ICD-10-CM | POA: Diagnosis not present

## 2019-05-17 DIAGNOSIS — E785 Hyperlipidemia, unspecified: Secondary | ICD-10-CM | POA: Diagnosis not present

## 2019-05-17 DIAGNOSIS — T887XXA Unspecified adverse effect of drug or medicament, initial encounter: Secondary | ICD-10-CM

## 2019-05-18 ENCOUNTER — Ambulatory Visit: Payer: Medicare Other | Attending: Internal Medicine | Admitting: Physical Therapy

## 2019-05-18 VITALS — BP 140/80

## 2019-05-18 DIAGNOSIS — R209 Unspecified disturbances of skin sensation: Secondary | ICD-10-CM | POA: Diagnosis not present

## 2019-05-18 DIAGNOSIS — M25562 Pain in left knee: Secondary | ICD-10-CM

## 2019-05-18 DIAGNOSIS — M25561 Pain in right knee: Secondary | ICD-10-CM | POA: Insufficient documentation

## 2019-05-18 DIAGNOSIS — G8929 Other chronic pain: Secondary | ICD-10-CM | POA: Insufficient documentation

## 2019-05-18 DIAGNOSIS — R2681 Unsteadiness on feet: Secondary | ICD-10-CM | POA: Diagnosis not present

## 2019-05-18 DIAGNOSIS — R262 Difficulty in walking, not elsewhere classified: Secondary | ICD-10-CM | POA: Insufficient documentation

## 2019-05-18 DIAGNOSIS — R208 Other disturbances of skin sensation: Secondary | ICD-10-CM

## 2019-05-18 LAB — LIPID PANEL
Cholesterol: 122 mg/dL (ref <200)
HDL: 41 mg/dL — ABNORMAL LOW (ref >=50)
LDL Cholesterol (Calc): 65 mg/dL (calc)
Non-HDL Cholesterol (Calc): 81 mg/dL (calc) (ref <130)
Total CHOL/HDL Ratio: 3 (calc) (ref <5.0)
Triglycerides: 79 mg/dL (ref <150)

## 2019-05-18 LAB — HEPATIC FUNCTION PANEL
AG Ratio: 1.1 (calc) (ref 1.0–2.5)
ALT: 104 U/L — ABNORMAL HIGH (ref 6–29)
AST: 96 U/L — ABNORMAL HIGH (ref 10–35)
Albumin: 4.1 g/dL (ref 3.6–5.1)
Alkaline phosphatase (APISO): 275 U/L — ABNORMAL HIGH (ref 37–153)
Bilirubin, Direct: 0.1 mg/dL (ref 0.0–0.2)
Globulin: 3.9 g/dL (calc) — ABNORMAL HIGH (ref 1.9–3.7)
Indirect Bilirubin: 0.4 mg/dL (calc) (ref 0.2–1.2)
Total Bilirubin: 0.5 mg/dL (ref 0.2–1.2)
Total Protein: 8 g/dL (ref 6.1–8.1)

## 2019-05-18 LAB — HEMOGLOBIN A1C
Hgb A1c MFr Bld: 5.8 % of total Hgb — ABNORMAL HIGH (ref <5.7)
Mean Plasma Glucose: 120 (calc)
eAG (mmol/L): 6.6 (calc)

## 2019-05-18 NOTE — Progress Notes (Signed)
Cholesterol is at goal Sugar average is in prediabetic range (has improved) Liver tests have gone up again including alk phos now so I think it's gallstone related rather than medications I know she saw Dr. Fuller Plan and was given warning signs that her gall bladder is acting up.   We will discuss at her routine visit as planned

## 2019-05-18 NOTE — Therapy (Signed)
[facility] [address] [place], [place], [address] Phone: [phone]   Fax:  [phone]  Physical Therapy Treatment and 10th visit Progress Note  Patient Details  Name: Alison [name] MRN: [id number] Date of Birth: [birth year] Referring Provider (PT): Gayland Curry, DO   Encounter Date: 05/18/2019   Progress Note Reporting Period 02/22/19 to 05/18/2019  See note below for Objective Data and Assessment of Progress/Goals.    PT End of Session - 05/18/19 0930    Visit Number  10    Number of Visits  16    Date for PT Re-Evaluation  07/07/19    Authorization Type  Medicare Primary/GHI Secondary - 10th visit PN    Progress Note Due on Visit  20    PT Start Time  0847    PT Stop Time  0930    PT Time Calculation (min)  43 min       Past Medical History:  Diagnosis Date  . Anxiety   . Benign essential hypertension   . Blood transfusion without reported diagnosis   . Diabetes mellitus without complication ([place])   . Diverticulitis of colon (without mention of hemorrhage)(562.11)   . Goiter, specified as simple   . Hyperlipidemia LDL goal < 100   . Intrahepatic bile duct dilation   . Numbness    TOES RT FOOT and toes left foot  . Obesity, unspecified   . Osteoarthrosis, unspecified whether generalized or localized, lower leg   . Osteopenia   . Other specified disease of nail    FUNGUS OF FINGERNAILS  . Pernicious anemia   . Unspecified glaucoma(365.9)   . Unspecified vitamin D deficiency   . Wears dentures   . Wears glasses     Past Surgical History:  Procedure Laterality Date  . BACK SURGERY    . CATARACT EXTRACTION     right eye 06/20/15 left eye 09/12/15  . COLECTOMY  2009  . COLONOSCOPY  09/06/2007   Dr Carol Ada  . DILATION AND CURETTAGE OF UTERUS    . ENDOSCOPIC RETROGRADE CHOLANGIOPANCREATOGRAPHY (ERCP) WITH PROPOFOL N/A 03/17/2019   Procedure: ENDOSCOPIC RETROGRADE CHOLANGIOPANCREATOGRAPHY  (ERCP) WITH PROPOFOL;  Surgeon: Ladene Artist, MD;  Location: [facility] ENDOSCOPY;  Service: Endoscopy;  Laterality: N/A;  . JOINT REPLACEMENT     right  . LUMBAR LAMINECTOMY/DECOMPRESSION MICRODISCECTOMY N/A 03/30/2013   Procedure: CENTRAL DECOMPRESSION L4 - L5 AND EXCISION 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;  Surgeon: Tobi Bastos, MD;  Location: [facility];  Service: Orthopedics;  Laterality: N/A;  . MULTIPLE TOOTH EXTRACTIONS    . REMOVAL OF STONES  03/17/2019   Procedure: REMOVAL OF STONES;  Surgeon: Ladene Artist, MD;  Location: [facility] ENDOSCOPY;  Service: Endoscopy;;  . SIGMOIDOSCOPY  02/16/2018   Dr Fuller Plan  . SPHINCTEROTOMY  03/17/2019   Procedure: SPHINCTEROTOMY;  Surgeon: Ladene Artist, MD;  Location: [facility];  Service: Endoscopy;;  . TOTAL KNEE ARTHROPLASTY Right 02/01/2013   Procedure: RIGHT TOTAL KNEE ARTHROPLASTY;  Surgeon: Tobi Bastos, MD;  Location: [facility];  Service: Orthopedics;  Laterality: Right;  . TOTAL KNEE ARTHROPLASTY Left 04/18/2014   Procedure: LEFT TOTAL KNEE ARTHROPLASTY;  Surgeon: Latanya Maudlin, MD;  Location: [facility];  Service: Orthopedics;  Laterality: Left;    Vitals:   05/18/19 0850  BP: 140/80    Subjective Assessment - 05/18/19 0849    Subjective  "Will you check my pressure this morning."  Haven't check it in two weeks,  feeling good though.  Tired after last session, a little sore.    Pertinent History  anxiety, HTN, DM, HLD, diverticulitis, obesity, OA with bilat TKA, osteopenia, glaucoma, colectomy, lumbar laminectomy/decompression and cataract extraction    Patient Stated Goals  Help the pain and help the balance    Currently in Pain?  No/denies                       [facility] Adult PT Treatment/Exercise - 05/18/19 0918      Exercises   Exercises  Other Exercises    Other Exercises   Updated and revised HEP based on progress and to improve compliance at home.  See exercises below.       Access Code: [email or web address] URL:  [email or web address] Date: 05/18/2019 Prepared by: Misty Stanley  Exercises Heel Toe Raises with Counter Support - 1 x daily - 7 x weekly - 2 sets - 10 reps Standing Hip Abduction with Resistance at Ankles and Counter Support - 1 x daily - 7 x weekly - 2 sets - 10 reps Standing Hip Extension with Resistance at Ankles and Counter Support - 1 x daily - 7 x weekly - 2 sets - 10 reps Hip and Knee Flexion with Anchored Resistance - 1 x daily - 7 x weekly - 2 sets - 10 second hold Standing Hamstring Stretch with Step - 1 x daily - 7 x weekly - 2 sets - 30 second hold       Balance Exercises - 05/18/19 0928      Balance Exercises: Standing   Rockerboard  Anterior/posterior;Head turns;EO;10 seconds;10 reps;Intermittent UE support        PT Education - 05/18/19 0930    Education Details  Updated HEP    Person(s) Educated  Patient    Methods  Explanation;Demonstration;Handout    Comprehension  Verbalized understanding;Returned demonstration       PT Short Term Goals - 05/09/19 1535      PT SHORT TERM GOAL #1   Title  Pt will demonstrate independence with ongoing HEP and walking program for aerobic conditioning    Time  4    Period  Weeks    Status  Revised    Target Date  06/07/19      PT SHORT TERM GOAL #2   Title  Pt will decrease time to perform five time sit to stand to </= 17 seconds    Baseline  Five time sit to stand is now [facility]: 12 seconds    Status  Achieved      PT SHORT TERM GOAL #3   Title  Pt will increase BERG score to >/= 51/56 to indicate decreased falls risk    Baseline  49/56    Time  4    Period  Weeks    Status  Revised    Target Date  06/07/19      PT SHORT TERM GOAL #4   Title  Pt will improve DGI to >/= 20/24    Baseline  18/24    Time  4    Period  Weeks    Status  Revised    Target Date  06/07/19      PT SHORT TERM GOAL #5   Title  Pt will ambulate x 500' over paved outdoor surfaces, negotiate curb with cane and supervision     Time  4    Period  Weeks    Status  Revised    Target Date  06/07/19        PT Long Term Goals - 05/09/19 1538      PT LONG TERM GOAL #1   Title  Pt will demonstrate independence with final LE strength/balance HEP, biking program and community wellness plan    Time  8    Period  Weeks    Status  Revised    Target Date  07/07/19      PT LONG TERM GOAL #3   Title  Pt will increase BERG to >/= 53/56 to indicate decreased falls risk    Time  8    Period  Weeks    Status  Revised    Target Date  07/07/19      PT LONG TERM GOAL #4   Title  Pt will increase DGI to >/= 22/24 with cane to indicate decreased risk for falls in community    Time  8    Period  Weeks    Status  Revised    Target Date  07/07/19      PT LONG TERM GOAL #5   Title  Pt will negotiate ramp and 5 stairs with cane MOD I for safe home entry/exit    Baseline  --    Time  8    Period  Weeks    Status  Revised    Target Date  07/07/19            Plan - 05/18/19 1210    Clinical Impression Statement  Pt has not performed HEP in 2 weeks; discussed importance of compliance with HEP - revised and progressed HEP but limited program to 4 exercises to improve compliance.  Added theraband for increased resistance and strengthening.  Initiated balance reaction training with rockerboard for unstable surface for increased use of hip strategy with anterior/posterior LOB.  Will continue to address in order to progress towards LTG.    Personal Factors and Comorbidities  Comorbidity 3+;Fitness;Social Background;Transportation    Comorbidities  anxiety, HTN, DM, HLD, diverticulitis, obesity, OA with bilat TKA, osteopenia, glaucoma, colectomy, lumbar laminectomy/decompression and cataract extraction    Examination-Activity Limitations  Caring for Others;Locomotion Level;Stairs;Stand    Examination-Participation Restrictions  Cleaning;Community Activity;Laundry;Meal Prep    Rehab Potential  Good    PT Frequency  1x /  week    PT Duration  8 weeks    PT Treatment/Interventions  ADLs/Self Care Home Management;Aquatic Therapy;Electrical Stimulation;Cryotherapy;Moist Heat;DME Instruction;Gait training;Stair training;Functional mobility training;Therapeutic activities;Therapeutic exercise;Balance training;Neuromuscular re-education;Patient/family education;Orthotic Fit/Training;Passive range of motion;Dry needling;Taping    PT Next Visit Plan  Has she been doing HEP?  ROCKERBOARD Laterally, hip strategy training.  Warm up on Nustep, levle 6 x 6-8 minutes.  Really focus on hip ABD strength-step ups laterally and forwards!!   standing balance with narrow BOS, compliant surface, eyes closed, SLS training    PT Home Exercise Plan  Access Code: [email or web address]    Consulted and Agree with Plan of Care  Patient       Patient will benefit from skilled therapeutic intervention in order to improve the following deficits and impairments:  Decreased balance, Decreased activity tolerance, Decreased strength, Difficulty walking, Impaired sensation, Pain  Visit Diagnosis: Other disturbances of skin sensation  Unsteadiness on feet  Difficulty in walking, not elsewhere classified  Chronic pain of right knee  Chronic pain of left knee     Problem List Patient Active Problem List   Diagnosis Date Noted  . Calculus of bile duct without cholangitis with obstruction   .  Renal calcification 03/10/2019  . Tobacco use disorder, continuous 03/10/2019  . Transaminitis 02/04/2019  . Abnormal ultrasound of liver 02/04/2019  . Sessile rectal polyp 05/02/2018  . Internal hemorrhoids without complication 99991111  . Diverticulosis 05/02/2018  . 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 Chronic pain of both knees 08/19/2017  . Cataracts, bilateral 07/08/2015  . Hyperlipidemia associated with type 2 diabetes mellitus ([place]) 04/27/2015  . History of total knee arthroplasty 04/18/2014  . History of lumbar laminectomy  for spinal cord decompression 05/04/2013  . Synovial cyst of lumbar spine 03/31/2013  . Spinal stenosis of lumbar region with neurogenic claudication 03/30/2013  . Synovial cyst of lumbar facet joint 03/30/2013  . Type 2 diabetes mellitus with neurological complications ([place]) A999333  . Osteoarthritis of right knee 02/01/2013  . History of total knee replacement 02/01/2013  . Tobacco abuse 10/03/2012  . Hyperglycemia 10/03/2012  . Unspecified vitamin D deficiency   . Pernicious anemia   . Glaucoma (increased eye pressure)   . Benign essential hypertension   . Osteopenia     Rico Junker, PT, DPT 05/18/19    12:13 PM    [facility] [address] [place], [place], [address] Phone: [phone]   Fax:  [phone]  Name: Kamayah Bania MRN: [id number] Date of Birth: 05/05/1945

## 2019-05-18 NOTE — Patient Instructions (Addendum)
Access Code: [email or web address] URL: [email or web address] Date: 05/18/2019 Prepared by: Misty Stanley  Exercises Heel Toe Raises with Counter Support - 1 x daily - 7 x weekly - 2 sets - 10 reps Standing Hip Abduction with Resistance at Ankles and Counter Support - 1 x daily - 7 x weekly - 2 sets - 10 reps Standing Hip Extension with Resistance at Ankles and Counter Support - 1 x daily - 7 x weekly - 2 sets - 10 reps Hip and Knee Flexion with Anchored Resistance - 1 x daily - 7 x weekly - 2 sets - 10 second hold Standing Hamstring Stretch with Step - 1 x daily - 7 x weekly - 2 sets - 30 second hold

## 2019-05-22 ENCOUNTER — Other Ambulatory Visit: Payer: Self-pay

## 2019-05-22 ENCOUNTER — Ambulatory Visit (INDEPENDENT_AMBULATORY_CARE_PROVIDER_SITE_OTHER): Payer: Medicare Other | Admitting: Internal Medicine

## 2019-05-22 ENCOUNTER — Encounter: Payer: Self-pay | Admitting: Internal Medicine

## 2019-05-22 VITALS — BP 138/78 | HR 77 | Temp 97.9°F | Ht 65.0 in | Wt 209.6 lb

## 2019-05-22 DIAGNOSIS — R6889 Other general symptoms and signs: Secondary | ICD-10-CM

## 2019-05-22 DIAGNOSIS — E1169 Type 2 diabetes mellitus with other specified complication: Secondary | ICD-10-CM

## 2019-05-22 DIAGNOSIS — R7401 Elevation of levels of liver transaminase levels: Secondary | ICD-10-CM

## 2019-05-22 DIAGNOSIS — K8020 Calculus of gallbladder without cholecystitis without obstruction: Secondary | ICD-10-CM

## 2019-05-22 DIAGNOSIS — Z780 Asymptomatic menopausal state: Secondary | ICD-10-CM

## 2019-05-22 DIAGNOSIS — M25562 Pain in left knee: Secondary | ICD-10-CM | POA: Diagnosis not present

## 2019-05-22 DIAGNOSIS — R310 Gross hematuria: Secondary | ICD-10-CM | POA: Diagnosis not present

## 2019-05-22 DIAGNOSIS — I10 Essential (primary) hypertension: Secondary | ICD-10-CM

## 2019-05-22 DIAGNOSIS — E785 Hyperlipidemia, unspecified: Secondary | ICD-10-CM

## 2019-05-22 DIAGNOSIS — E1149 Type 2 diabetes mellitus with other diabetic neurological complication: Secondary | ICD-10-CM

## 2019-05-22 DIAGNOSIS — M25561 Pain in right knee: Secondary | ICD-10-CM

## 2019-05-22 DIAGNOSIS — G8929 Other chronic pain: Secondary | ICD-10-CM | POA: Diagnosis not present

## 2019-05-22 DIAGNOSIS — E2839 Other primary ovarian failure: Secondary | ICD-10-CM

## 2019-05-22 NOTE — Progress Notes (Signed)
Location:  [facility] clinic Provider:  Emad Brechtel L. Mariea Clonts, D.O., C.M.D.  Goals of Care:  Advanced Directives 05/22/2019  Does Patient Have a Medical Advance Directive? Yes  Type of Advance Directive Living will  Does patient want to make changes to medical advance directive? No - Patient declined  Would patient like information on creating a medical advance directive? -  Pre-existing out of facility DNR order (yellow form or pink MOST form) -     Chief Complaint  Patient presents with  . Medical Management of Chronic Issues    6 weeks follow up     HPI: Patient is a 74 y.o. female seen today for medical management of chronic diseases--6 wk f/u.    She's feeling pretty good.  She's cold a lot of the time which is new for her.  She was having blood in her urine the beginning of last week.  None this week.  Appt is Wed for urology f/u 5/12.  Reviewed CT abd/pelvis urology had ordered:  She knew she had small fibroids in the past (nickel or quarter-sized).  There is a ventral hernia with mesh repair per CT (pt does not recall having a ventral hernia repair--had her partial colectomy and reconnected to her small bowel (before she started to see me here at [facility]).    Sugar average was 5.8--lower prediabetic range.  Bad cholesterol at goal.  Transaminases up again. She has had pain going around her right shoulder but she's attributed it to joint problems from sleeping on it.  The left shoulder will also get sore.   Last week, she had some stomach ache that was not so good.    She has been back on her statins, but taking less tylenol than before.  Just on 2 per day at times, but previously 2 in am and 2 in pm--2600mg  maximum.  Also on mobic daily and low dose metformin.      BP at goal today  She had her diabetic eye exam.   Past Medical History:  Diagnosis Date  . Anxiety   . Benign essential hypertension   . Blood transfusion without reported diagnosis   . Diabetes mellitus without  complication ([place])   . Diverticulitis of colon (without mention of hemorrhage)(562.11)   . Goiter, specified as simple   . Hyperlipidemia LDL goal < 100   . Intrahepatic bile duct dilation   . Numbness    TOES RT FOOT and toes left foot  . Obesity, unspecified   . Osteoarthrosis, unspecified whether generalized or localized, lower leg   . Osteopenia   . Other specified disease of nail    FUNGUS OF FINGERNAILS  . Pernicious anemia   . Unspecified glaucoma(365.9)   . Unspecified vitamin D deficiency   . Wears dentures   . Wears glasses     Past Surgical History:  Procedure Laterality Date  . BACK SURGERY    . CATARACT EXTRACTION     right eye 06/20/15 left eye 09/12/15  . COLECTOMY  2009  . COLONOSCOPY  09/06/2007   Dr Carol Ada  . DILATION AND CURETTAGE OF UTERUS    . ENDOSCOPIC RETROGRADE CHOLANGIOPANCREATOGRAPHY (ERCP) WITH PROPOFOL N/A 03/17/2019   Procedure: ENDOSCOPIC RETROGRADE CHOLANGIOPANCREATOGRAPHY (ERCP) WITH PROPOFOL;  Surgeon: Ladene Artist, MD;  Location: [facility] ENDOSCOPY;  Service: Endoscopy;  Laterality: N/A;  . JOINT REPLACEMENT     right  . LUMBAR LAMINECTOMY/DECOMPRESSION MICRODISCECTOMY N/A 03/30/2013   Procedure: CENTRAL DECOMPRESSION L4 - L5 AND EXCISION OF  SYNOVIAL CYST ON THE LEFT;  Surgeon: Tobi Bastos, MD;  Location: [facility] ORS;  Service: Orthopedics;  Laterality: N/A;  . MULTIPLE TOOTH EXTRACTIONS    . REMOVAL OF STONES  03/17/2019   Procedure: REMOVAL OF STONES;  Surgeon: Ladene Artist, MD;  Location: [facility] ENDOSCOPY;  Service: Endoscopy;;  . SIGMOIDOSCOPY  02/16/2018   Dr Fuller Plan  . SPHINCTEROTOMY  03/17/2019   Procedure: SPHINCTEROTOMY;  Surgeon: Ladene Artist, MD;  Location: [facility];  Service: Endoscopy;;  . TOTAL KNEE ARTHROPLASTY Right 02/01/2013   Procedure: RIGHT TOTAL KNEE ARTHROPLASTY;  Surgeon: Tobi Bastos, MD;  Location: [facility] ORS;  Service: Orthopedics;  Laterality: Right;  . TOTAL KNEE ARTHROPLASTY Left 04/18/2014   Procedure: LEFT  TOTAL KNEE ARTHROPLASTY;  Surgeon: Latanya Maudlin, MD;  Location: [facility] ORS;  Service: Orthopedics;  Laterality: Left;    Allergies  Allergen Reactions  . Aspirin Nausea Only    Can take 81 mg but can't take 325mg , hard on stomach.    Outpatient Encounter Medications as of 05/22/2019  Medication Sig  . acetaminophen (TYLENOL) 650 MG CR tablet Take 1,300 mg by mouth in the morning and at bedtime.  Marland Kitchen aspirin EC 81 MG tablet Take 81 mg by mouth daily.  Marland Kitchen atorvastatin (LIPITOR) 40 MG tablet Take 1 tablet (40 mg total) by mouth daily.  . Calcium Carb-Cholecalciferol (CALCIUM 600 + D PO) Take 1 tablet by mouth daily.  . carboxymethylcellulose (REFRESH PLUS) 0.5 % SOLN Place 1 drop into both eyes 3 (three) times daily as needed (dry eyes).   . Cholecalciferol (VITAMIN D-3) 1000 UNITS CAPS Take 1,000 Units by mouth daily.   . COMBIGAN 0.2-0.5 % ophthalmic solution Place 1 drop into both eyes in the morning and at bedtime.   . gabapentin (NEURONTIN) 300 MG capsule Take 1 capsule (300 mg total) by mouth at bedtime.  . Homeopathic Products (THERAWORX RELIEF EX) Apply 1 application topically daily as needed (pain).  Marland Kitchen latanoprost (XALATAN) 0.005 % ophthalmic solution Place 1 drop into both eyes at bedtime.  Marland Kitchen lisinopril (ZESTRIL) 10 MG tablet Take 1 tablet (10 mg total) by mouth daily.  . meloxicam (MOBIC) 15 MG tablet Take 15 mg by mouth daily.   . Menthol, Topical Analgesic, (ICY HOT EX) Apply 1 application topically daily as needed (pain).  . metFORMIN (GLUCOPHAGE) 500 MG tablet TAKE 1/2 (ONE-HALF) TABLET BY MOUTH IN THE EVENING WITH SUPPER  . Multiple Vitamin (MULTIVITAMIN) tablet Take 1 tablet by mouth daily.  . Omega-3 Fatty Acids (OMEGA 3 PO) Take 1,200 mg by mouth daily.  Marland Kitchen trolamine salicylate (ASPERCREME) 10 % cream Apply 1 application topically as needed for muscle pain.   No facility-administered encounter medications on file as of 05/22/2019.    Review of Systems:  Review of Systems    Constitutional: Negative for chills, fever and malaise/fatigue.  HENT: Negative for congestion.   Eyes: Negative for blurred vision.  Respiratory: Negative for cough and shortness of breath.   Cardiovascular: Negative for chest pain, palpitations and leg swelling.  Gastrointestinal: Positive for abdominal pain. Negative for blood in stool, constipation, diarrhea, heartburn, melena, nausea and vomiting.  Genitourinary: Positive for hematuria. Negative for dysuria, frequency and urgency.  Musculoskeletal: Positive for joint pain. Negative for falls.  Skin: Negative for itching and rash.  Neurological: Negative for dizziness and loss of consciousness.       Balance problems, using cane  Endo/Heme/Allergies: Bruises/bleeds easily.  Psychiatric/Behavioral: Negative for depression and memory loss. The patient  is not nervous/anxious and does not have insomnia.     Health Maintenance  Topic Date Due  . FOOT EXAM  12/28/2017  . OPHTHALMOLOGY EXAM  04/29/2018  . INFLUENZA VACCINE  08/13/2019  . HEMOGLOBIN A1C  11/17/2019  . MAMMOGRAM  07/28/2020  . COLONOSCOPY  02/17/2023  . TETANUS/TDAP  10/29/2023  . DEXA SCAN  Completed  . COVID-19 Vaccine  Completed  . Hepatitis C Screening  Completed  . PNA vac Low Risk Adult  Completed    Physical Exam: Vitals:   05/22/19 0937  BP: 138/78  Pulse: 77  Temp: 97.9 F (36.6 C)  TempSrc: Temporal  SpO2: 99%  Weight: 209 lb 9.6 oz (95.1 kg)  Height: 5\' 5"  (1.651 m)   Body mass index is 34.88 kg/m. Physical Exam Vitals reviewed.  Constitutional:      Appearance: Normal appearance.  HENT:     Head: Normocephalic and atraumatic.  Eyes:     Comments: glasses  Cardiovascular:     Rate and Rhythm: Normal rate and regular rhythm.     Pulses: Normal pulses.     Heart sounds: Normal heart sounds.  Pulmonary:     Effort: Pulmonary effort is normal.     Breath sounds: Normal breath sounds. No wheezing, rhonchi or rales.  Abdominal:      General: Bowel sounds are normal.     Palpations: Abdomen is soft.     Comments: Vertical cicatrix from upper abdomen to pubic bone  Musculoskeletal:        General: Normal range of motion.     Right lower leg: No edema.     Left lower leg: No edema.  Skin:    General: Skin is warm and dry.  Neurological:     General: No focal deficit present.     Mental Status: She is alert and oriented to person, place, and time.     Gait: Gait abnormal.     Comments: Unsteady gait uses cane  Psychiatric:        Mood and Affect: Mood normal.     Comments: More anxious the past few visit with test abnormalities     Labs reviewed: Basic Metabolic Panel: Recent Labs    01/09/19 0814 01/17/19 0813 03/14/19 1159  NA 138 136 136  K 4.3 4.4 4.1  CL 104 103 101  CO2 26 26 30   GLUCOSE 113* 120* 105*  BUN 13 14 11   CREATININE 0.90 0.93 0.83  CALCIUM 9.6 9.9 10.0   Liver Function Tests: Recent Labs    02/10/19 1040 03/14/19 1159 05/17/19 0924  AST 26 23 96*  ALT 28 21 104*  ALKPHOS  --  77  --   BILITOT 0.4 0.4 0.5  PROT 7.6 8.2 8.0  ALBUMIN  --  4.2  --    No results for input(s): LIPASE, AMYLASE in the last 8760 hours. No results for input(s): AMMONIA in the last 8760 hours. CBC: Recent Labs    01/09/19 0814 01/17/19 0813 03/14/19 1159  WBC 10.0 12.1* 9.7  NEUTROABS 6,990 8,095* 5.9  HGB 12.3 12.5 13.0  HCT 38.3 38.6 39.4  MCV 88.5 87.5 88.1  PLT 398 418* 324.0   Lipid Panel: Recent Labs    09/01/18 0808 01/09/19 0814 05/17/19 0924  CHOL 110 114 122  HDL 35* 38* 41*  LDLCALC 58 59 65  TRIG 87 83 79  CHOLHDL 3.1 3.0 3.0   Lab Results  Component Value Date   HGBA1C 5.8 (  H) 05/17/2019    Procedures since last visit: CT ABDOMEN PELVIS W WO CONTRAST  Result Date: 05/12/2019 CLINICAL DATA:  Gross hematuria EXAM: CT ABDOMEN AND PELVIS WITHOUT AND WITH CONTRAST TECHNIQUE: Multidetector CT imaging of the abdomen and pelvis was performed following the standard  protocol before and following the bolus administration of intravenous contrast. CONTRAST:  146mL ISOVUE-300 IOPAMIDOL (ISOVUE-300) INJECTION 61% COMPARISON:  MR abdomen, 03/11/2019 FINDINGS: Lower chest: No acute abnormality. Hepatobiliary: No solid liver abnormality is seen. Rim calcified gallstone in the gallbladder fundus. Gallbladder wall thickening, or biliary dilatation. Pancreas: Unremarkable. No pancreatic ductal dilatation or surrounding inflammatory changes. Spleen: Normal in size without significant abnormality. Adrenals/Urinary Tract: Adrenal glands are unremarkable. There is a nonenhancing, rim calcified cyst of the anterior midportion of the right kidney (series 3, image 21). Thinly septated, benign subcentimeter cyst of the inferior pole of the posterior midportion of the left kidney. Kidneys are otherwise normal, without renal calculi, solid lesion, or hydronephrosis. Bladder is unremarkable. Stomach/Bowel: Stomach is within normal limits. Appendix appears normal. No evidence of bowel wall thickening, distention, or inflammatory changes. Vascular/Lymphatic: Aortic atherosclerosis. No enlarged abdominal or pelvic lymph nodes. Reproductive: Fibroid uterus. Other: Broad-based midline ventral hernia, likely with prior mesh repair. No abdominopelvic ascites. Musculoskeletal: No acute or significant osseous findings. IMPRESSION: 1. No evidence of urinary tract calculi, mass, or filling defect to explain hematuria. No hydronephrosis. 2.  Cholelithiasis. 3.  Uterine fibroids. 4.  Aortic Atherosclerosis (ICD10-I70.0). Electronically Signed   By: Eddie Candle M.D.   On: 05/12/2019 11:40    Assessment/Plan 1. Transaminitis - has recurred - very hard to determine if this is due to stones passing with her cholelithiasis vs meds -advised to hold all hepatically metabolized meds:  Mobic, metformin, tylenol and statin and recheck liver panel in a week - Hepatic function panel; Future - Hepatic function  panel; Future  2. Type 2 diabetes mellitus with neurological complications (HCC) -control in prediabetic range at 5.8 so will stop metformin for now - CBC with Differential/Platelet; Future - Basic metabolic panel; Future - Lipid panel; Future - Hemoglobin A1c; Future  3. Calculus of gallbladder without cholecystitis without obstruction - cholelithiasis still seen on CT abd/pelvis -if liver panel does not improve with all hepatic meds held, may need to see surgery for chole with her nonspecific intermittent abdominal pain and right shoulder pain - Hepatic function panel; Future  4. Benign essential hypertension -bp at goal this time, cont same regimen  5. Gross hematuria - amid workup with urology -had another bout of visible blood in urine (not from vagina--fibroid uterus noted but postmenopausal) last week which has now resolved, no related symptoms  6. Chronic pain of both knees -advised to use voltaren gel on knees or other topicals while off tylenol and mobic  7. Sensation of feeling cold - will check thyroid before routine in 3 mos - TSH; Future  8. Hyperlipidemia associated with type 2 diabetes mellitus ([place]) - hold statin again--lipids were at goal finally with it, but I'm concerned about role in transaminitis - Lipid panel; Future  Labs/tests ordered:   Lab Orders     CBC with Differential/Platelet     Basic metabolic panel     Hepatic function panel     Lipid panel     TSH     Hemoglobin A1c     Hepatic function panel  Next appt:  06/09/2019   Enrigue Hashimi L. Corby Vandenberghe, D.O. [facility] [phone]  [address], [place] [address] Cell Phone (Mon-Fri 8am-5pm):  [phone] On Call:  [phone] & follow prompts after 5pm & weekends Office Phone:  [phone] Office Fax:  [phone]

## 2019-05-22 NOTE — Patient Instructions (Addendum)
Stop metformin (plan on long-term if you can eat right).   Stop your tylenol, mobic/meloxicam, and statin. Recheck liver tests in one week.    Get voltaren gel/diclofenac gel from [facility].    Ask the eye doctor to send me their notes.

## 2019-05-23 ENCOUNTER — Encounter: Payer: Self-pay | Admitting: Internal Medicine

## 2019-05-24 ENCOUNTER — Encounter: Payer: Self-pay | Admitting: Urology

## 2019-05-24 ENCOUNTER — Other Ambulatory Visit: Payer: Self-pay

## 2019-05-24 ENCOUNTER — Ambulatory Visit (INDEPENDENT_AMBULATORY_CARE_PROVIDER_SITE_OTHER): Payer: Medicare Other | Admitting: Urology

## 2019-05-24 VITALS — BP 162/89 | HR 80 | Ht 66.0 in | Wt 209.0 lb

## 2019-05-24 DIAGNOSIS — R310 Gross hematuria: Secondary | ICD-10-CM | POA: Diagnosis not present

## 2019-05-24 DIAGNOSIS — R3129 Other microscopic hematuria: Secondary | ICD-10-CM | POA: Diagnosis not present

## 2019-05-24 NOTE — Progress Notes (Signed)
  05/24/2019  CC:  Chief Complaint  Patient presents with  . Cysto    HPI: Alison [name] is a 74 y/o black female who presents today for a cystoscopy.  - Visited for gross hematuria on 04/20/2019  -He denies recurrent hematuria   There were no vitals taken for this visit. NED. A&Ox3.   No respiratory distress   Abd soft, NT, ND Atrophic external genitalia with patent urethral meatus  Cystoscopy Procedure Note  Patient identification was confirmed, informed consent was obtained, and patient was prepped using Betadine solution.  Lidocaine jelly was administered per urethral meatus.    Procedure: - Flexible cystoscope introduced, without any difficulty.   - Thorough search of the bladder revealed:    normal urethral meatus    normal urothelium    no stones    no ulcers     no tumors    no urethral polyps    no trabeculation  - Ureteral orifices were normal in position and appearance.  Post-Procedure: - Patient tolerated the procedure well  Imaging: CT images personally reviewed  CLINICAL DATA:  Gross hematuria  EXAM: CT ABDOMEN AND PELVIS WITHOUT AND WITH CONTRAST  TECHNIQUE: Multidetector CT imaging of the abdomen and pelvis was performed following the standard protocol before and following the bolus administration of intravenous contrast.  CONTRAST:  1100mL ISOVUE-300 IOPAMIDOL (ISOVUE-300) INJECTION 61%  COMPARISON:  MR abdomen, 03/11/2019  FINDINGS: Lower chest: No acute abnormality.  Hepatobiliary: No solid liver abnormality is seen. Rim calcified gallstone in the gallbladder fundus. Gallbladder wall thickening, or biliary dilatation.  Pancreas: Unremarkable. No pancreatic ductal dilatation or surrounding inflammatory changes.  Spleen: Normal in size without significant abnormality.  Adrenals/Urinary Tract: Adrenal glands are unremarkable. There is a nonenhancing, rim calcified cyst of the anterior midportion of the right kidney (series 3,  image 21). Thinly septated, benign subcentimeter cyst of the inferior pole of the posterior midportion of the left kidney. Kidneys are otherwise normal, without renal calculi, solid lesion, or hydronephrosis. Bladder is unremarkable.  Stomach/Bowel: Stomach is within normal limits. Appendix appears normal. No evidence of bowel wall thickening, distention, or inflammatory changes.  Vascular/Lymphatic: Aortic atherosclerosis. No enlarged abdominal or pelvic lymph nodes.  Reproductive: Fibroid uterus.  Other: Broad-based midline ventral hernia, likely with prior mesh repair. No abdominopelvic ascites.  Musculoskeletal: No acute or significant osseous findings.  IMPRESSION: 1. No evidence of urinary tract calculi, mass, or filling defect to explain hematuria. No hydronephrosis.  2.  Cholelithiasis.  3.  Uterine fibroids.  4.  Aortic Atherosclerosis (ICD10-I70.0).   Electronically Signed   By: Eddie Candle M.D.   On: 05/12/2019 11:40   Assessment/ Plan:  1. Hematuria - no significant abnormalities cystoscopy -Bosniak 2 cyst, no follow-up required -Urine cytology ordered - 6 month follow up -Call earlier for recurrent gross hematuria    I, Kyla Peace, am acting as a scribe for Dr. Nicki Reaper C. Gable Odonohue.  I have reviewed the above documentation for accuracy and completeness, and I agree with the above.    Abbie Sons, MD

## 2019-05-25 LAB — URINALYSIS, COMPLETE
Bilirubin, UA: NEGATIVE
Glucose, UA: NEGATIVE
Ketones, UA: NEGATIVE
Leukocytes,UA: NEGATIVE
Nitrite, UA: NEGATIVE
Protein,UA: NEGATIVE
Specific Gravity, UA: 1.01 (ref 1.005–1.030)
Urobilinogen, Ur: 0.2 mg/dL (ref 0.2–1.0)
pH, UA: 5.5 (ref 5.0–7.5)

## 2019-05-25 LAB — MICROSCOPIC EXAMINATION

## 2019-05-26 ENCOUNTER — Other Ambulatory Visit: Payer: Self-pay

## 2019-05-26 ENCOUNTER — Encounter: Payer: Self-pay | Admitting: Physical Therapy

## 2019-05-26 ENCOUNTER — Ambulatory Visit: Payer: Medicare Other | Admitting: Physical Therapy

## 2019-05-26 DIAGNOSIS — R2681 Unsteadiness on feet: Secondary | ICD-10-CM

## 2019-05-26 DIAGNOSIS — R262 Difficulty in walking, not elsewhere classified: Secondary | ICD-10-CM

## 2019-05-26 DIAGNOSIS — M25561 Pain in right knee: Secondary | ICD-10-CM | POA: Diagnosis not present

## 2019-05-26 DIAGNOSIS — R209 Unspecified disturbances of skin sensation: Secondary | ICD-10-CM | POA: Diagnosis not present

## 2019-05-26 DIAGNOSIS — M25562 Pain in left knee: Secondary | ICD-10-CM | POA: Diagnosis not present

## 2019-05-26 DIAGNOSIS — R208 Other disturbances of skin sensation: Secondary | ICD-10-CM

## 2019-05-26 DIAGNOSIS — G8929 Other chronic pain: Secondary | ICD-10-CM

## 2019-05-26 NOTE — Therapy (Signed)
[facility] [address] [place], [place], [address] Phone: [phone]   Fax:  [phone]  Physical Therapy Treatment  Patient Details  Name: Alison [name] MRN: [id number] Date of Birth: September 19, 1945 Referring Provider (PT): Gayland Curry, DO   Encounter Date: 05/26/2019  PT End of Session - 05/26/19 1017    Visit Number  11    Number of Visits  16    Date for PT Re-Evaluation  07/07/19    Authorization Type  Medicare Primary/GHI Secondary - 10th visit PN    Progress Note Due on Visit  20    PT Start Time  0936    PT Stop Time  1017    PT Time Calculation (min)  41 min    Activity Tolerance  Patient tolerated treatment well    Behavior During Therapy  [facility] for tasks assessed/performed       Past Medical History:  Diagnosis Date  . Anxiety   . Benign essential hypertension   . Blood transfusion without reported diagnosis   . Diabetes mellitus without complication ([place])   . Diverticulitis of colon (without mention of hemorrhage)(562.11)   . Goiter, specified as simple   . Hyperlipidemia LDL goal < 100   . Intrahepatic bile duct dilation   . Numbness    TOES RT FOOT and toes left foot  . Obesity, unspecified   . Osteoarthrosis, unspecified whether generalized or localized, lower leg   . Osteopenia   . Other specified disease of nail    FUNGUS OF FINGERNAILS  . Pernicious anemia   . Unspecified glaucoma(365.9)   . Unspecified vitamin D deficiency   . Wears dentures   . Wears glasses     Past Surgical History:  Procedure Laterality Date  . BACK SURGERY    . CATARACT EXTRACTION     right eye 06/20/15 left eye 09/12/15  . COLECTOMY  2009  . COLONOSCOPY  09/06/2007   Dr Carol Ada  . DILATION AND CURETTAGE OF UTERUS    . ENDOSCOPIC RETROGRADE CHOLANGIOPANCREATOGRAPHY (ERCP) WITH PROPOFOL N/A 03/17/2019   Procedure: ENDOSCOPIC RETROGRADE CHOLANGIOPANCREATOGRAPHY (ERCP) WITH PROPOFOL;  Surgeon: Ladene Artist, MD;  Location: [facility] END[facility]COPY;  Service: Endoscopy;  Laterality: N/A;  . JOINT REPLACEMENT     right  . LUMBAR LAMINECTOMY/DECOMPRESSION MICRODISCECTOMY N/A 03/30/2013   Procedure: CENTRAL DECOMPRESSION L4 - L5 AND EXCISION 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;  Surgeon: Tobi Bastos, MD;  Location: [facility];  Service: Orthopedics;  Laterality: N/A;  . MULTIPLE TOOTH EXTRACTIONS    . REMOVAL OF STONES  03/17/2019   Procedure: REMOVAL OF STONES;  Surgeon: Ladene Artist, MD;  Location: [facility] ENDOSCOPY;  Service: Endoscopy;;  . SIGMOIDOSCOPY  02/16/2018   Dr Fuller Plan  . SPHINCTEROTOMY  03/17/2019   Procedure: SPHINCTEROTOMY;  Surgeon: Ladene Artist, MD;  Location: [place];  Service: Endoscopy;;  . TOTAL KNEE ARTHROPLASTY Right 02/01/2013   Procedure: RIGHT TOTAL KNEE ARTHROPLASTY;  Surgeon: Tobi Bastos, MD;  Location: [facility];  Service: Orthopedics;  Laterality: Right;  . TOTAL KNEE ARTHROPLASTY Left 04/18/2014   Procedure: LEFT TOTAL KNEE ARTHROPLASTY;  Surgeon: Latanya Maudlin, MD;  Location: [facility];  Service: Orthopedics;  Laterality: Left;    There were no vitals filed for this visit.  Subjective Assessment - 05/26/19 0939    Subjective  Pt very achy and sore this morning.  Has not been taking her anti-inflammatory medication due to elevated liver enzymes.  Goes  for more blood work on Monday to recheck levels.  Had a scope done and couldn't determine where the blood in her urine is coming from.    Pertinent History  anxiety, HTN, DM, HLD, diverticulitis, obesity, OA with bilat TKA, osteopenia, glaucoma, colectomy, lumbar laminectomy/decompression and cataract extraction    Patient Stated Goals  Help the pain and help the balance                        [facility] Adult PT Treatment/Exercise - 05/26/19 0946      Therapeutic Activites    Therapeutic Activities  Other Therapeutic Activities    Other Therapeutic Activities  Discussed plan for next three visits and plan to D/C  at end of this cert period.  Discussed options for community wellness and importance of maintaining activity level, exercises and mobility especially to prevent consequences of OA and sedentary lifestyle.  Discussed Silver Sneakers due to pt having medicare.      Knee/Hip Exercises: Stretches   Piriformis Stretch  Left;2 reps;30 seconds    Piriformis Stretch Limitations  after TDN and balance on rockerboard      Knee/Hip Exercises: Aerobic   Nustep  Level 5 x 8 minutes with bilat UE and LE for general aerobic conditioning and warm up before balance exercises           Balance Exercises - 05/26/19 1000      Balance Exercises: Standing   Rockerboard  Lateral;Head turns;EO;EC;10 reps;Intermittent UE support   weight shifting, head turns/nods x 10 each EO/EC       PT Education - 05/26/19 1016    Education Details  Plan for next few visits; community wellness options, piriformis stretch    Person(s) Educated  Patient    Methods  Explanation;Demonstration    Comprehension  Verbalized understanding;Returned demonstration       PT Short Term Goals - 05/09/19 1535      PT SHORT TERM GOAL #1   Title  Pt will demonstrate independence with ongoing HEP and walking program for aerobic conditioning    Time  4    Period  Weeks    Status  Revised    Target Date  06/07/19      PT SHORT TERM GOAL #2   Title  Pt will decrease time to perform five time sit to stand to </= 17 seconds    Baseline  Five time sit to stand is now [place]: 12 seconds    Status  Achieved      PT SHORT TERM GOAL #3   Title  Pt will increase BERG score to >/= 51/56 to indicate decreased falls risk    Baseline  49/56    Time  4    Period  Weeks    Status  Revised    Target Date  06/07/19      PT SHORT TERM GOAL #4   Title  Pt will improve DGI to >/= 20/24    Baseline  18/24    Time  4    Period  Weeks    Status  Revised    Target Date  06/07/19      PT SHORT TERM GOAL #5   Title  Pt will ambulate x 500'  over paved outdoor surfaces, negotiate curb with cane and supervision    Time  4    Period  Weeks    Status  Revised    Target Date  06/07/19  PT Long Term Goals - 05/09/19 1538      PT LONG TERM GOAL #1   Title  Pt will demonstrate independence with final LE strength/balance HEP, biking program and community wellness plan    Time  8    Period  Weeks    Status  Revised    Target Date  07/07/19      PT LONG TERM GOAL #3   Title  Pt will increase BERG to >/= 53/56 to indicate decreased falls risk    Time  8    Period  Weeks    Status  Revised    Target Date  07/07/19      PT LONG TERM GOAL #4   Title  Pt will increase DGI to >/= 22/24 with cane to indicate decreased risk for falls in community    Time  8    Period  Weeks    Status  Revised    Target Date  07/07/19      PT LONG TERM GOAL #5   Title  Pt will negotiate ramp and 5 stairs with cane MOD I for safe home entry/exit    Baseline  --    Time  8    Period  Weeks    Status  Revised    Target Date  07/07/19            Plan - 05/26/19 1221    Clinical Impression Statement  Pt reporting increased generalized pain in back, hips and LE due to coming off medications but pt able to tolerate progression of aerobic conditioning and balance training on unsteady surface.  Demonstrated to pt how to perform seated piriformis stretches to assist with improving hip ROM; no increase in pain during session but pt continued to ambulate with antalgic gait at end of session.    Personal Factors and Comorbidities  Comorbidity 3+;Fitness;Social Background;Transportation    Comorbidities  anxiety, HTN, DM, HLD, diverticulitis, obesity, OA with bilat TKA, osteopenia, glaucoma, colectomy, lumbar laminectomy/decompression and cataract extraction    Examination-Activity Limitations  Caring for Others;Locomotion Level;Stairs;Stand    Examination-Participation Restrictions  Cleaning;Community Activity;Laundry;Meal Prep    Rehab  Potential  Good    PT Frequency  1x / week    PT Duration  8 weeks    PT Treatment/Interventions  ADLs/Self Care Home Management;Aquatic Therapy;Electrical Stimulation;Cryotherapy;Moist Heat;DME Instruction;Gait training;Stair training;Functional mobility training;Therapeutic activities;Therapeutic exercise;Balance training;Neuromuscular re-education;Patient/family education;Orthotic Fit/Training;Passive range of motion;Dry needling;Taping    PT Next Visit Plan  Warm up on Nustep, levle 6 x 8-10 minutes.  Really focus on hip ABD strength-step ups laterally and forwards!!   standing balance with narrow BOS, compliant surface, eyes closed, SLS training    PT Home Exercise Plan  Access Code: [email or web address]    Consulted and Agree with Plan of Care  Patient       Patient will benefit from skilled therapeutic intervention in order to improve the following deficits and impairments:  Decreased balance, Decreased activity tolerance, Decreased strength, Difficulty walking, Impaired sensation, Pain  Visit Diagnosis: Other disturbances of skin sensation  Unsteadiness on feet  Difficulty in walking, not elsewhere classified  Chronic pain of right knee  Chronic pain of left knee     Problem List Patient Active Problem List   Diagnosis Date Noted  . Calculus of bile duct without cholangitis with obstruction   . Renal calcification 03/10/2019  . Tobacco use disorder, continuous 03/10/2019  . Transaminitis 02/04/2019  . Abnormal ultrasound of liver 02/04/2019  .  Sessile rectal polyp 05/02/2018  . Internal hemorrhoids without complication 99991111  . Diverticulosis 05/02/2018  . 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 Chronic pain of both knees 08/19/2017  . Cataracts, bilateral 07/08/2015  . Hyperlipidemia associated with type 2 diabetes mellitus ([place]) 04/27/2015  . History of total knee arthroplasty 04/18/2014  . History of lumbar laminectomy for spinal cord decompression  05/04/2013  . Synovial cyst of lumbar spine 03/31/2013  . Spinal stenosis of lumbar region with neurogenic claudication 03/30/2013  . Synovial cyst of lumbar facet joint 03/30/2013  . Type 2 diabetes mellitus with neurological complications ([place]) [id number]  . Osteoarthritis of right knee 02/01/2013  . History of total knee replacement 02/01/2013  . Tobacco abuse 10/03/2012  . Hyperglycemia 10/03/2012  . Unspecified vitamin D deficiency   . Pernicious anemia   . Glaucoma (increased eye pressure)   . Benign essential hypertension   . Osteopenia     Rico Junker, PT, DPT 05/26/19    12:26 PM    [facility] [address] [place], [place], [address] Phone: [phone]   Fax:  [phone]  Name: Alison [name] MRN: [id number] Date of Birth: 13-May-1945

## 2019-05-29 ENCOUNTER — Other Ambulatory Visit: Payer: Self-pay

## 2019-05-29 ENCOUNTER — Other Ambulatory Visit: Payer: Medicare Other

## 2019-05-29 DIAGNOSIS — E1169 Type 2 diabetes mellitus with other specified complication: Secondary | ICD-10-CM | POA: Diagnosis not present

## 2019-05-29 DIAGNOSIS — K8020 Calculus of gallbladder without cholecystitis without obstruction: Secondary | ICD-10-CM | POA: Diagnosis not present

## 2019-05-29 DIAGNOSIS — E1149 Type 2 diabetes mellitus with other diabetic neurological complication: Secondary | ICD-10-CM | POA: Diagnosis not present

## 2019-05-29 DIAGNOSIS — E785 Hyperlipidemia, unspecified: Secondary | ICD-10-CM | POA: Diagnosis not present

## 2019-05-29 DIAGNOSIS — R7401 Elevation of levels of liver transaminase levels: Secondary | ICD-10-CM | POA: Diagnosis not present

## 2019-05-29 DIAGNOSIS — R6889 Other general symptoms and signs: Secondary | ICD-10-CM | POA: Diagnosis not present

## 2019-05-30 ENCOUNTER — Other Ambulatory Visit: Payer: Self-pay | Admitting: Internal Medicine

## 2019-05-30 DIAGNOSIS — E1142 Type 2 diabetes mellitus with diabetic polyneuropathy: Secondary | ICD-10-CM

## 2019-05-30 LAB — CBC WITH DIFFERENTIAL/PLATELET
Absolute Monocytes: 770 cells/uL (ref 200–950)
Basophils Absolute: 64 cells/uL (ref 0–200)
Basophils Relative: 0.6 %
Eosinophils Absolute: 150 cells/uL (ref 15–500)
Eosinophils Relative: 1.4 %
HCT: 38.1 % (ref 35.0–45.0)
Hemoglobin: 12.3 g/dL (ref 11.7–15.5)
Lymphs Abs: 2375 cells/uL (ref 850–3900)
MCH: 28.5 pg (ref 27.0–33.0)
MCHC: 32.3 g/dL (ref 32.0–36.0)
MCV: 88.2 fL (ref 80.0–100.0)
MPV: 10.1 fL (ref 7.5–12.5)
Monocytes Relative: 7.2 %
Neutro Abs: 7340 cells/uL (ref 1500–7800)
Neutrophils Relative %: 68.6 %
Platelets: 417 10*3/uL — ABNORMAL HIGH (ref 140–400)
RBC: 4.32 10*6/uL (ref 3.80–5.10)
RDW: 13.1 % (ref 11.0–15.0)
Total Lymphocyte: 22.2 %
WBC: 10.7 10*3/uL (ref 3.8–10.8)

## 2019-05-30 LAB — HEPATIC FUNCTION PANEL
AG Ratio: 1 (calc) (ref 1.0–2.5)
ALT: 77 U/L — ABNORMAL HIGH (ref 6–29)
AST: 52 U/L — ABNORMAL HIGH (ref 10–35)
Albumin: 4 g/dL (ref 3.6–5.1)
Alkaline phosphatase (APISO): 192 U/L — ABNORMAL HIGH (ref 37–153)
Bilirubin, Direct: 0.1 mg/dL (ref 0.0–0.2)
Globulin: 4.2 g/dL (calc) — ABNORMAL HIGH (ref 1.9–3.7)
Indirect Bilirubin: 0.4 mg/dL (calc) (ref 0.2–1.2)
Total Bilirubin: 0.5 mg/dL (ref 0.2–1.2)
Total Protein: 8.2 g/dL — ABNORMAL HIGH (ref 6.1–8.1)

## 2019-05-30 LAB — HEMOGLOBIN A1C
Hgb A1c MFr Bld: 5.9 % of total Hgb — ABNORMAL HIGH (ref <5.7)
Mean Plasma Glucose: 123 (calc)
eAG (mmol/L): 6.8 (calc)

## 2019-05-30 LAB — LIPID PANEL
Cholesterol: 159 mg/dL (ref <200)
HDL: 40 mg/dL — ABNORMAL LOW (ref >=50)
LDL Cholesterol (Calc): 102 mg/dL (calc) — ABNORMAL HIGH
Non-HDL Cholesterol (Calc): 119 mg/dL (calc) (ref <130)
Total CHOL/HDL Ratio: 4 (calc) (ref <5.0)
Triglycerides: 84 mg/dL (ref <150)

## 2019-05-30 LAB — BASIC METABOLIC PANEL WITH GFR
BUN: 12 mg/dL (ref 7–25)
CO2: 29 mmol/L (ref 20–32)
Calcium: 9.9 mg/dL (ref 8.6–10.4)
Chloride: 101 mmol/L (ref 98–110)
Creat: 0.87 mg/dL (ref 0.60–0.93)
GFR, Est African American: 76 mL/min/{1.73_m2} (ref >=60)
GFR, Est Non African American: 66 mL/min/{1.73_m2} (ref >=60)
Glucose, Bld: 109 mg/dL — ABNORMAL HIGH (ref 65–99)
Potassium: 4.5 mmol/L (ref 3.5–5.3)
Sodium: 135 mmol/L (ref 135–146)

## 2019-05-30 LAB — TSH: TSH: 0.53 mIU/L (ref 0.40–4.50)

## 2019-05-30 NOTE — Progress Notes (Signed)
Liver panel remains abnormal.  This indicates to me that her metformin, tylenol, meloxicam/mobic and statin are NOT the cause and this is due to her gallstones.  GI wanted her to follow-up if she developed related symptoms of abdominal pain, nausea, vomiting.  If she is not having these, I am ok with her resuming all of her usual medications that we held (metformin, tylenol, meloxicam/mobic and her statin).  Her cholesterol went up quickly without the statin.  PLEASE READ HER EXACTLY WHAT I WROTE AND MAKE SURE SHE UNDERSTANDS IT CLEARLY (she has not always correctly adjusted her meds between visits so I want to make sure she's good).

## 2019-05-31 ENCOUNTER — Other Ambulatory Visit: Payer: Self-pay | Admitting: Urology

## 2019-05-31 NOTE — Telephone Encounter (Signed)
rx sent to pharmacy by e-script  

## 2019-06-02 ENCOUNTER — Ambulatory Visit: Payer: Medicare Other | Admitting: Physical Therapy

## 2019-06-02 ENCOUNTER — Other Ambulatory Visit: Payer: Self-pay

## 2019-06-02 VITALS — BP 118/76 | HR 75

## 2019-06-02 DIAGNOSIS — M25562 Pain in left knee: Secondary | ICD-10-CM | POA: Diagnosis not present

## 2019-06-02 DIAGNOSIS — R209 Unspecified disturbances of skin sensation: Secondary | ICD-10-CM | POA: Diagnosis not present

## 2019-06-02 DIAGNOSIS — M25561 Pain in right knee: Secondary | ICD-10-CM

## 2019-06-02 DIAGNOSIS — R2681 Unsteadiness on feet: Secondary | ICD-10-CM | POA: Diagnosis not present

## 2019-06-02 DIAGNOSIS — R208 Other disturbances of skin sensation: Secondary | ICD-10-CM

## 2019-06-02 DIAGNOSIS — G8929 Other chronic pain: Secondary | ICD-10-CM

## 2019-06-02 DIAGNOSIS — R262 Difficulty in walking, not elsewhere classified: Secondary | ICD-10-CM | POA: Diagnosis not present

## 2019-06-02 NOTE — Therapy (Signed)
[facility] [address] [place], [place], [address] Phone: [phone]   Fax:  [phone]  Physical Therapy Treatment  Patient Details  Name: Alison [name] MRN: [id number] Date of Birth: 10/24/1945 Referring Provider (PT): Gayland Curry, DO   Encounter Date: 06/02/2019  PT End of Session - 06/02/19 1103    Visit Number  12    Number of Visits  16    Date for PT Re-Evaluation  07/07/19    Authorization Type  Medicare Primary/GHI Secondary - 10th visit PN    Progress Note Due on Visit  20    PT Start Time  1022    PT Stop Time  1103    PT Time Calculation (min)  41 min    Activity Tolerance  Patient tolerated treatment well    Behavior During Therapy  [facility] for tasks assessed/performed       Past Medical History:  Diagnosis Date  . Anxiety   . Benign essential hypertension   . Blood transfusion without reported diagnosis   . Diabetes mellitus without complication ([place])   . Diverticulitis of colon (without mention of hemorrhage)(562.11)   . Goiter, specified as simple   . Hyperlipidemia LDL goal < 100   . Intrahepatic bile duct dilation   . Numbness    TOES RT FOOT and toes left foot  . Obesity, unspecified   . Osteoarthrosis, unspecified whether generalized or localized, lower leg   . Osteopenia   . Other specified disease of nail    FUNGUS OF FINGERNAILS  . Pernicious anemia   . Unspecified glaucoma(365.9)   . Unspecified vitamin D deficiency   . Wears dentures   . Wears glasses     Past Surgical History:  Procedure Laterality Date  . BACK SURGERY    . CATARACT EXTRACTION     right eye 06/20/15 left eye 09/12/15  . COLECTOMY  2009  . COLONOSCOPY  09/06/2007   Dr Carol Ada  . DILATION AND CURETTAGE OF UTERUS    . ENDOSCOPIC RETROGRADE CHOLANGIOPANCREATOGRAPHY (ERCP) WITH PROPOFOL N/A 03/17/2019   Procedure: ENDOSCOPIC RETROGRADE CHOLANGIOPANCREATOGRAPHY (ERCP) WITH PROPOFOL;  Surgeon: Ladene Artist, MD;  Location: [facility] ENDOSCOPY;  Service: Endoscopy;  Laterality: N/A;  . JOINT REPLACEMENT     right  . LUMBAR LAMINECTOMY/DECOMPRESSION MICRODISCECTOMY N/A 03/30/2013   Procedure: CENTRAL DECOMPRESSION L4 - L5 AND EXCISION 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;  Surgeon: Tobi Bastos, MD;  Location: [facility];  Service: Orthopedics;  Laterality: N/A;  . MULTIPLE TOOTH EXTRACTIONS    . REMOVAL OF STONES  03/17/2019   Procedure: REMOVAL OF STONES;  Surgeon: Ladene Artist, MD;  Location: [facility] ENDOSCOPY;  Service: Endoscopy;;  . SIGMOIDOSCOPY  02/16/2018   Dr Fuller Plan  . SPHINCTEROTOMY  03/17/2019   Procedure: SPHINCTEROTOMY;  Surgeon: Ladene Artist, MD;  Location: [place];  Service: Endoscopy;;  . TOTAL KNEE ARTHROPLASTY Right 02/01/2013   Procedure: RIGHT TOTAL KNEE ARTHROPLASTY;  Surgeon: Tobi Bastos, MD;  Location: [facility];  Service: Orthopedics;  Laterality: Right;  . TOTAL KNEE ARTHROPLASTY Left 04/18/2014   Procedure: LEFT TOTAL KNEE ARTHROPLASTY;  Surgeon: Latanya Maudlin, MD;  Location: [facility];  Service: Orthopedics;  Laterality: Left;    Vitals:   06/02/19 1027  BP: 118/76  Pulse: 75    Subjective Assessment - 06/02/19 1026    Subjective  Back on her medications and feeling better.  Asking PT to check her BP.  Pertinent History  anxiety, HTN, DM, HLD, diverticulitis, obesity, OA with bilat TKA, osteopenia, glaucoma, colectomy, lumbar laminectomy/decompression and cataract extraction    Patient Stated Goals  Help the pain and help the balance    Currently in Pain?  No/denies         [facility] PT Assessment - 06/02/19 1033      Ambulation/Gait   Ambulation/Gait  Yes      Standardized Balance Assessment   Standardized Balance Assessment  Berg Balance Test;Dynamic Gait Index      Berg Balance Test   Sit to Stand  Able to stand without using hands and stabilize independently    Standing Unsupported  Able to stand safely 2 minutes    Sitting with Back Unsupported but Feet  Supported on Floor or Stool  Able to sit safely and securely 2 minutes    Stand to Sit  Sits safely with minimal use of hands    Transfers  Able to transfer safely, minor use of hands    Standing Unsupported with Eyes Closed  Able to stand 10 seconds safely    Standing Unsupported with Feet Together  Able to place feet together independently and stand 1 minute safely    From Standing, Reach Forward with Outstretched Arm  Can reach confidently >25 cm (10")    From Standing Position, Pick up Object from Floor  Able to pick up shoe safely and easily    From Standing Position, Turn to Look Behind Over each Shoulder  Looks behind from both sides and weight shifts well    Turn 360 Degrees  Able to turn 360 degrees safely but slowly    Standing Unsupported, Alternately Place Feet on Step/Stool  Able to stand independently and safely and complete 8 steps in 20 seconds    Standing Unsupported, One Foot in Front  Able to plae foot ahead of the other independently and hold 30 seconds    Standing on One Leg  Able to lift leg independently and hold equal to or more than 3 seconds    Total Score  51    Berg comment:  51/56      Dynamic Gait Index   Level Surface  Normal    Change in Gait Speed  Normal    Gait with Horizontal Head Turns  Normal    Gait with Vertical Head Turns  Mild Impairment    Gait and Pivot Turn  Normal    Step Over Obstacle  Normal    Step Around Obstacles  Normal    Steps  Mild Impairment    Total Score  22    DGI comment:  22/24 low falls risk                     [facility] Adult PT Treatment/Exercise - 06/02/19 1033      Ambulation/Gait   Ambulation/Gait Assistance  5: Supervision    Ambulation/Gait Assistance Details  outside with cane over uneven pavement with intermittent shuffling of feet; stepping over parking bumper with cane and supervision    Ambulation Distance (Feet)  500 Feet    Assistive device  Straight cane    Gait Pattern  Step-through  pattern;Trendelenburg;Antalgic;Poor foot clearance - left;Poor foot clearance - right    Ambulation Surface  Unlevel;Outdoor;Paved    Stairs  Yes    Stairs Assistance  6: Modified independent (Device/Increase time)    Stair Management Technique  Two rails;Alternating pattern;Forwards    Number of Stairs  4    Height of Stairs  6    Curb  5: Supervision    Curb Details (indicate cue type and reason)  with straight cane             PT Education - 06/02/19 1103    Education Details  progress towards goals, areas to continue to focus on    Person(s) Educated  Patient    Methods  Explanation    Comprehension  Verbalized understanding       PT Short Term Goals - 06/02/19 1032      PT SHORT TERM GOAL #1   Title  Pt will demonstrate independence with ongoing HEP and walking program for aerobic conditioning    Time  4    Period  Weeks    Status  Achieved    Target Date  06/07/19      PT SHORT TERM GOAL #2   Title  Pt will decrease time to perform five time sit to stand to </= 17 seconds    Baseline  Five time sit to stand is now [facility]: 12 seconds    Status  Achieved      PT SHORT TERM GOAL #3   Title  Pt will increase BERG score to >/= 51/56 to indicate decreased falls risk    Baseline  51/56    Time  4    Period  Weeks    Status  Achieved    Target Date  06/07/19      PT SHORT TERM GOAL #4   Title  Pt will improve DGI to >/= 20/24    Baseline  22/24    Time  4    Period  Weeks    Status  Achieved    Target Date  06/07/19      PT SHORT TERM GOAL #5   Title  Pt will ambulate x 500' over paved outdoor surfaces, negotiate curb with cane and supervision    Time  4    Period  Weeks    Status  Achieved    Target Date  06/07/19        PT Long Term Goals - 05/09/19 1538      PT LONG TERM GOAL #1   Title  Pt will demonstrate independence with final LE strength/balance HEP, biking program and community wellness plan    Time  8    Period  Weeks    Status  Revised     Target Date  07/07/19      PT LONG TERM GOAL #3   Title  Pt will increase BERG to >/= 53/56 to indicate decreased falls risk    Time  8    Period  Weeks    Status  Revised    Target Date  07/07/19      PT LONG TERM GOAL #4   Title  Pt will increase DGI to >/= 22/24 with cane to indicate decreased risk for falls in community    Time  8    Period  Weeks    Status  Revised    Target Date  07/07/19      PT LONG TERM GOAL #5   Title  Pt will negotiate ramp and 5 stairs with cane MOD I for safe home entry/exit    Baseline  --    Time  8    Period  Weeks    Status  Revised    Target Date  07/07/19  Plan - 06/02/19 1207    Clinical Impression Statement  Pain improved today.  Performed assessment of progress towards STG.  Pt is making excellent progress and has met all STG demonstrating decreased falls risk, improved standing balance, improved balance safety with higher level gait and improved LE strength for single limb support activities.  Pt continues to demonstrate difficulty with turns and narrow BOS due to ongoing glute med weakness.  Will continue to address in order to progress towards LTG.    Personal Factors and Comorbidities  Comorbidity 3+;Fitness;Social Background;Transportation    Comorbidities  anxiety, HTN, DM, HLD, diverticulitis, obesity, OA with bilat TKA, osteopenia, glaucoma, colectomy, lumbar laminectomy/decompression and cataract extraction    Examination-Activity Limitations  Caring for Others;Locomotion Level;Stairs;Stand    Examination-Participation Restrictions  Cleaning;Community Activity;Laundry;Meal Prep    Rehab Potential  Good    PT Frequency  1x / week    PT Duration  8 weeks    PT Treatment/Interventions  ADLs/Self Care Home Management;Aquatic Therapy;Electrical Stimulation;Cryotherapy;Moist Heat;DME Instruction;Gait training;Stair training;Functional mobility training;Therapeutic activities;Therapeutic exercise;Balance training;Neuromuscular  re-education;Patient/family education;Orthotic Fit/Training;Passive range of motion;Dry needling;Taping    PT Next Visit Plan  Warm up on Nustep, levle 6 x 8-10 minutes.  Really focus on hip ABD strength-step ups laterally and forwards!!   Working on turns, standing balance with narrow BOS, compliant surface, eyes closed, SLS training    PT Home Exercise Plan  Access Code: [email or web address]    Consulted and Agree with Plan of Care  Patient       Patient will benefit from skilled therapeutic intervention in order to improve the following deficits and impairments:  Decreased balance, Decreased activity tolerance, Decreased strength, Difficulty walking, Impaired sensation, Pain  Visit Diagnosis: Unsteadiness on feet  Other disturbances of skin sensation  Difficulty in walking, not elsewhere classified  Chronic pain of right knee  Chronic pain of left knee     Problem List Patient Active Problem List   Diagnosis Date Noted  . Calculus of bile duct without cholangitis with obstruction   . Renal calcification 03/10/2019  . Tobacco use disorder, continuous 03/10/2019  . Transaminitis 02/04/2019  . Abnormal ultrasound of liver 02/04/2019  . Sessile rectal polyp 05/02/2018  . Internal hemorrhoids without complication 93/71/6967  . Diverticulosis 05/02/2018  . 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 Chronic pain of both knees 08/19/2017  . Cataracts, bilateral 07/08/2015  . Hyperlipidemia associated with type 2 diabetes mellitus ([place]) 04/27/2015  . History of total knee arthroplasty 04/18/2014  . History of lumbar laminectomy for spinal cord decompression 05/04/2013  . Synovial cyst of lumbar spine 03/31/2013  . Spinal stenosis of lumbar region with neurogenic claudication 03/30/2013  . Synovial cyst of lumbar facet joint 03/30/2013  . Type 2 diabetes mellitus with neurological complications ([place]) 89/38/1017  . Osteoarthritis of right knee 02/01/2013  . History of total  knee replacement 02/01/2013  . Tobacco abuse 10/03/2012  . Hyperglycemia 10/03/2012  . Unspecified vitamin D deficiency   . Pernicious anemia   . Glaucoma (increased eye pressure)   . Benign essential hypertension   . Osteopenia     Rico Junker, PT, DPT 06/02/19    12:10 PM    [facility] [address] [place], [place], [address] Phone: [phone]   Fax:  [phone]  Name: Alison [name] MRN: [id number] Date of Birth: Sep 11, 1945

## 2019-06-08 ENCOUNTER — Telehealth: Payer: Self-pay | Admitting: Urology

## 2019-06-08 ENCOUNTER — Ambulatory Visit: Payer: Medicare Other | Admitting: Physical Therapy

## 2019-06-08 ENCOUNTER — Encounter: Payer: Self-pay | Admitting: Physical Therapy

## 2019-06-08 ENCOUNTER — Other Ambulatory Visit: Payer: Self-pay

## 2019-06-08 DIAGNOSIS — M25561 Pain in right knee: Secondary | ICD-10-CM | POA: Diagnosis not present

## 2019-06-08 DIAGNOSIS — R2681 Unsteadiness on feet: Secondary | ICD-10-CM

## 2019-06-08 DIAGNOSIS — M25562 Pain in left knee: Secondary | ICD-10-CM

## 2019-06-08 DIAGNOSIS — G8929 Other chronic pain: Secondary | ICD-10-CM | POA: Diagnosis not present

## 2019-06-08 DIAGNOSIS — R209 Unspecified disturbances of skin sensation: Secondary | ICD-10-CM | POA: Diagnosis not present

## 2019-06-08 DIAGNOSIS — R208 Other disturbances of skin sensation: Secondary | ICD-10-CM

## 2019-06-08 DIAGNOSIS — R262 Difficulty in walking, not elsewhere classified: Secondary | ICD-10-CM | POA: Diagnosis not present

## 2019-06-08 NOTE — Telephone Encounter (Signed)
Urine cytology showed no abnormal cells 

## 2019-06-08 NOTE — Therapy (Signed)
[facility] [address] [place], [place], [address] Phone: [phone]   Fax:  [phone]  Physical Therapy Treatment  Patient Details  Name: Alison [name] MRN: [id number] Date of Birth: Jan 17, 1945 Referring Provider (PT): Gayland Curry, DO   Encounter Date: 06/08/2019  PT End of Session - 06/08/19 1635    Visit Number  13    Number of Visits  16    Date for PT Re-Evaluation  07/07/19    Authorization Type  Medicare Primary/[facility] Secondary - 10th visit PN    Progress Note Due on Visit  20    PT Start Time  1017    PT Stop Time  1058    PT Time Calculation (min)  41 min    Activity Tolerance  Patient tolerated treatment well    Behavior During Therapy  [facility] for tasks assessed/performed       Past Medical History:  Diagnosis Date  . Anxiety   . Benign essential hypertension   . Blood transfusion without reported diagnosis   . Diabetes mellitus without complication ([place])   . Diverticulitis of colon (without mention of hemorrhage)(562.11)   . Goiter, specified as simple   . Hyperlipidemia LDL goal < 100   . Intrahepatic bile duct dilation   . Numbness    TOES RT FOOT and toes left foot  . Obesity, unspecified   . Osteoarthrosis, unspecified whether generalized or localized, lower leg   . Osteopenia   . Other specified disease of nail    FUNGUS OF FINGERNAILS  . Pernicious anemia   . Unspecified glaucoma(365.9)   . Unspecified vitamin D deficiency   . Wears dentures   . Wears glasses     Past Surgical History:  Procedure Laterality Date  . BACK SURGERY    . CATARACT EXTRACTION     right eye 06/20/15 left eye 09/12/15  . COLECTOMY  2009  . COLONOSCOPY  09/06/2007   Dr Carol Ada  . DILATION AND CURETTAGE OF UTERUS    . ENDOSCOPIC RETROGRADE CHOLANGIOPANCREATOGRAPHY (ERCP) WITH PROPOFOL N/A 03/17/2019   Procedure: ENDOSCOPIC RETROGRADE CHOLANGIOPANCREATOGRAPHY (ERCP) WITH PROPOFOL;  Surgeon: Ladene Artist, MD;  Location: [facility] ENDOSCOPY;  Service: Endoscopy;  Laterality: N/A;  . JOINT REPLACEMENT     right  . LUMBAR LAMINECTOMY/DECOMPRESSION MICRODISCECTOMY N/A 03/30/2013   Procedure: CENTRAL DECOMPRESSION L4 - L5 AND EXCISION 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;  Surgeon: Tobi Bastos, MD;  Location: [facility];  Service: Orthopedics;  Laterality: N/A;  . MULTIPLE TOOTH EXTRACTIONS    . REMOVAL OF STONES  03/17/2019   Procedure: REMOVAL OF STONES;  Surgeon: Ladene Artist, MD;  Location: [facility] ENDOSCOPY;  Service: Endoscopy;;  . SIGMOIDOSCOPY  02/16/2018   Dr Fuller Plan  . SPHINCTEROTOMY  03/17/2019   Procedure: SPHINCTEROTOMY;  Surgeon: Ladene Artist, MD;  Location: [place];  Service: Endoscopy;;  . TOTAL KNEE ARTHROPLASTY Right 02/01/2013   Procedure: RIGHT TOTAL KNEE ARTHROPLASTY;  Surgeon: Tobi Bastos, MD;  Location: [facility];  Service: Orthopedics;  Laterality: Right;  . TOTAL KNEE ARTHROPLASTY Left 04/18/2014   Procedure: LEFT TOTAL KNEE ARTHROPLASTY;  Surgeon: Latanya Maudlin, MD;  Location: [facility];  Service: Orthopedics;  Laterality: Left;    There were no vitals filed for this visit.  Subjective Assessment - 06/08/19 1019    Subjective  No falls, doing well. Exercises are going alright at home.    Pertinent History  anxiety, HTN, DM, HLD, diverticulitis, obesity,  OA with bilat TKA, osteopenia, glaucoma, colectomy, lumbar laminectomy/decompression and cataract extraction    Patient Stated Goals  Help the pain and help the balance    Currently in Pain?  Yes   "always has left knee pain"                       [facility] Adult PT Treatment/Exercise - 06/08/19 1024      Neuro Re-ed    Neuro Re-ed Details   On blue compliant mat: feet together and eyes closed 3 x 30 seconds, attempted tandem stance - however pt reporting discomfort in LLE instead performed modified tandem stance 2 x 30 seconds each with eyes open. Min guard for balance activities.       Exercises    Exercises  Other Exercises    Other Exercises   With black foam beam (2") - step overs and weight shifting laterally and then stepping back to midline x10 reps B - with no UE support, cues for glute activation during stance       Knee/Hip Exercises: Aerobic   Nustep  level 6 x 8 minutes with BLE and BUE for aerobic warm up, ROM, and strengthening      Knee/Hip Exercises: Standing   Lateral Step Up  Both;1 set;10 reps;Hand Hold: 2;Step Height: 4"    Lateral Step Up Limitations  cues for glute activation    Forward Step Up  Both;1 set;10 reps;Hand Hold: 2;Hand Hold: 1;Step Height: 4"    Forward Step Up Limitations  cues on form and technique                PT Short Term Goals - 06/02/19 1032      PT SHORT TERM GOAL #1   Title  Pt will demonstrate independence with ongoing HEP and walking program for aerobic conditioning    Time  4    Period  Weeks    Status  Achieved    Target Date  06/07/19      PT SHORT TERM GOAL #2   Title  Pt will decrease time to perform five time sit to stand to </= 17 seconds    Baseline  Five time sit to stand is now [facility]: 12 seconds    Status  Achieved      PT SHORT TERM GOAL #3   Title  Pt will increase BERG score to >/= 51/56 to indicate decreased falls risk    Baseline  51/56    Time  4    Period  Weeks    Status  Achieved    Target Date  06/07/19      PT SHORT TERM GOAL #4   Title  Pt will improve DGI to >/= 20/24    Baseline  22/24    Time  4    Period  Weeks    Status  Achieved    Target Date  06/07/19      PT SHORT TERM GOAL #5   Title  Pt will ambulate x 500' over paved outdoor surfaces, negotiate curb with cane and supervision    Time  4    Period  Weeks    Status  Achieved    Target Date  06/07/19        PT Long Term Goals - 05/09/19 1538      PT LONG TERM GOAL #1   Title  Pt will demonstrate independence with final LE strength/balance HEP, biking program and community wellness  plan    Time  8    Period  Weeks     Status  Revised    Target Date  07/07/19      PT LONG TERM GOAL #3   Title  Pt will increase BERG to >/= 53/56 to indicate decreased falls risk    Time  8    Period  Weeks    Status  Revised    Target Date  07/07/19      PT LONG TERM GOAL #4   Title  Pt will increase DGI to >/= 22/24 with cane to indicate decreased risk for falls in community    Time  8    Period  Weeks    Status  Revised    Target Date  07/07/19      PT LONG TERM GOAL #5   Title  Pt will negotiate ramp and 5 stairs with cane MOD I for safe home entry/exit    Baseline  --    Time  8    Period  Weeks    Status  Revised    Target Date  07/07/19            Plan - 06/08/19 1637    Clinical Impression Statement  Focus of today's skilled session was BLE strengthening and narrow BOS support balance on compliant surfaces. Pt tolerated session well - needed intermittent rest breaks due to fatigue. Pt with incr difficulty and reporting discomfort in L knee with tandem stance with LLE posteriorly, modified to perform tandem with wider BOS with pt reporting no issues. Will continue to progress towards LTGs.    Personal Factors and Comorbidities  Comorbidity 3+;Fitness;Social Background;Transportation    Comorbidities  anxiety, HTN, DM, HLD, diverticulitis, obesity, OA with bilat TKA, osteopenia, glaucoma, colectomy, lumbar laminectomy/decompression and cataract extraction    Examination-Activity Limitations  Caring for Others;Locomotion Level;Stairs;Stand    Examination-Participation Restrictions  Cleaning;Community Activity;Laundry;Meal Prep    Rehab Potential  Good    PT Frequency  1x / week    PT Duration  8 weeks    PT Treatment/Interventions  ADLs/Self Care Home Management;Aquatic Therapy;Electrical Stimulation;Cryotherapy;Moist Heat;DME Instruction;Gait training;Stair training;Functional mobility training;Therapeutic activities;Therapeutic exercise;Balance training;Neuromuscular re-education;Patient/family  education;Orthotic Fit/Training;Passive range of motion;Dry needling;Taping    PT Next Visit Plan  Warm up on Nustep, levle 6 x 8-10 minutes.  Really focus on hip ABD strength-step ups laterally and forwards!!   Working on turns, standing balance with narrow BOS, compliant surface, eyes closed, SLS training    PT Home Exercise Plan  Access Code: [email or web address]    Consulted and Agree with Plan of Care  Patient       Patient will benefit from skilled therapeutic intervention in order to improve the following deficits and impairments:  Decreased balance, Decreased activity tolerance, Decreased strength, Difficulty walking, Impaired sensation, Pain  Visit Diagnosis: Unsteadiness on feet  Other disturbances of skin sensation  Difficulty in walking, not elsewhere classified  Chronic pain of left knee     Problem List Patient Active Problem List   Diagnosis Date Noted  . Calculus of bile duct without cholangitis with obstruction   . Renal calcification 03/10/2019  . Tobacco use disorder, continuous 03/10/2019  . Transaminitis 02/04/2019  . Abnormal ultrasound of liver 02/04/2019  . Sessile rectal polyp 05/02/2018  . Internal hemorrhoids without complication [id number]  . Diverticulosis 05/02/2018  . 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 Chronic pain of both knees 08/19/2017  . Cataracts, bilateral 07/08/2015  .  Hyperlipidemia associated with type 2 diabetes mellitus ([place]) 04/27/2015  . History of total knee arthroplasty 04/18/2014  . History of lumbar laminectomy for spinal cord decompression 05/04/2013  . Synovial cyst of lumbar spine 03/31/2013  . Spinal stenosis of lumbar region with neurogenic claudication 03/30/2013  . Synovial cyst of lumbar facet joint 03/30/2013  . Type 2 diabetes mellitus with neurological complications ([place]) [id number]  . Osteoarthritis of right knee 02/01/2013  . History of total knee replacement 02/01/2013  . Tobacco abuse 10/03/2012   . Hyperglycemia 10/03/2012  . Unspecified vitamin D deficiency   . Pernicious anemia   . Glaucoma (increased eye pressure)   . Benign essential hypertension   . Osteopenia     Arliss Journey , PT, DPT  06/08/2019, 4:38 PM  [facility] [address] [place], [place], [address] Phone: [phone]   Fax:  [phone]  Name: Alison [name] MRN: [id number] Date of Birth: 05-10-1945

## 2019-06-09 ENCOUNTER — Ambulatory Visit (INDEPENDENT_AMBULATORY_CARE_PROVIDER_SITE_OTHER): Payer: Medicare Other | Admitting: Family

## 2019-06-09 ENCOUNTER — Encounter: Payer: Self-pay | Admitting: Family

## 2019-06-09 DIAGNOSIS — E1149 Type 2 diabetes mellitus with other diabetic neurological complication: Secondary | ICD-10-CM

## 2019-06-09 DIAGNOSIS — Z0000 Encounter for general adult medical examination without abnormal findings: Secondary | ICD-10-CM

## 2019-06-09 NOTE — Progress Notes (Signed)
    This service is provided via telemedicine  No vital signs collected/recorded due to the encounter was a telemedicine visit.   Location of patient (ex: home, work):  Yes.  Patient consents to a telephone visit: Yes.  Location of the provider (ex: office, home):  [facility].   Name of any referring provider: N/A  Names of all persons participating in the telemedicine service and their role in the encounter:  Patient, Alison [name], RMA, Ngetich, Dinah, NP.    Time spent on call: 8 minutes spent on the phone with Medical Assistant.  

## 2019-06-09 NOTE — Telephone Encounter (Signed)
Patient notified and voiced understanding.

## 2019-06-09 NOTE — Patient Instructions (Signed)
Alison [name] , Thank you for taking time to come for your Medicare Wellness Visit. I appreciate your ongoing commitment to your health goals. Please review the following plan we discussed and let me know if I can assist you in the future.   Screening recommendations/referrals: Colonoscopy: Up to date  Mammogram: Up to date  Bone Density : Up to date  Recommended yearly ophthalmology/optometry visit for glaucoma screening and checkup Recommended yearly dental visit for hygiene and checkup  Vaccinations: Influenza vaccine : Up to date  Pneumococcal vaccine : Up to date  Tdap vaccine : Up to date due 10/29/2023  Shingles vaccine: Up to date    Advanced directives: No   Conditions/risks identified: Advance age female > 10 yrs,Type 2 Diabetes Mellitus,dyslipidemia,Hypertension,BMI > 30 ,Hx of smoking   Next appointment: 1 year    Preventive Care 29 Years and Older, Female Preventive care refers to lifestyle choices and visits with your health care provider that can promote health and wellness. What does preventive care include?  A yearly physical exam. This is also called an annual well check.  Dental exams once or twice a year.  Routine eye exams. Ask your health care provider how often you should have your eyes checked.  Personal lifestyle choices, including:  Daily care of your teeth and gums.  Regular physical activity.  Eating a healthy diet.  Avoiding tobacco and drug use.  Limiting alcohol use.  Practicing safe sex.  Taking low-dose aspirin every day.  Taking vitamin and mineral supplements as recommended by your health care provider. What happens during an annual well check? The services and screenings done by your health care provider during your annual well check will depend on your age, overall health, lifestyle risk factors, and family history of disease. Counseling  Your health care provider may ask you questions about your:  Alcohol use.  Tobacco  use.  Drug use.  Emotional well-being.  Home and relationship well-being.  Sexual activity.  Eating habits.  History of falls.  Memory and ability to understand (cognition).  Work and work Statistician.  Reproductive health. Screening  You may have the following tests or measurements:  Height, weight, and BMI.  Blood pressure.  Lipid and cholesterol levels. These may be checked every 5 years, or more frequently if you are over 22 years old.  Skin check.  Lung cancer screening. You may have this screening every year starting at age 38 if you have a 30-pack-year history of smoking and currently smoke or have quit within the past 15 years.  Fecal occult blood test (FOBT) of the stool. You may have this test every year starting at age 54.  Flexible sigmoidoscopy or colonoscopy. You may have a sigmoidoscopy every 5 years or a colonoscopy every 10 years starting at age 49.  Hepatitis C blood test.  Hepatitis B blood test.  Sexually transmitted disease (STD) testing.  Diabetes screening. This is done by checking your blood sugar (glucose) after you have not eaten for a while (fasting). You may have this done every 1-3 years.  Bone density scan. This is done to screen for osteoporosis. You may have this done starting at age 39.  Mammogram. This may be done every 1-2 years. Talk to your health care provider about how often you should have regular mammograms. Talk with your health care provider about your test results, treatment options, and if necessary, the need for more tests. Vaccines  Your health care provider may recommend certain vaccines, such as:  Influenza vaccine. This is recommended every year.  Tetanus, diphtheria, and acellular pertussis (Tdap, Td) vaccine. You may need a Td booster every 10 years.  Zoster vaccine. You may need this after age 20.  Pneumococcal 13-valent conjugate (PCV13) vaccine. One dose is recommended after age 81.  Pneumococcal  polysaccharide (PPSV23) vaccine. One dose is recommended after age 44. Talk to your health care provider about which screenings and vaccines you need and how often you need them. This information is not intended to replace advice given to you by your health care provider. Make sure you discuss any questions you have with your health care provider. Document Released: 01/25/2015 Document Revised: 09/18/2015 Document Reviewed: 10/30/2014 Elsevier Interactive Patient Education  2017 [place] Prevention in the Home Falls can cause injuries. They can happen to people of all ages. There are many things you can do to make your home safe and to help prevent falls. What can I do on the outside of my home?  Regularly fix the edges of walkways and driveways and fix any cracks.  Remove anything that might make you trip as you walk through a door, such as a raised step or threshold.  Trim any bushes or trees on the path to your home.  Use bright outdoor lighting.  Clear any walking paths of anything that might make someone trip, such as rocks or tools.  Regularly check to see if handrails are loose or broken. Make sure that both sides of any steps have handrails.  Any raised decks and porches should have guardrails on the edges.  Have any leaves, snow, or ice cleared regularly.  Use sand or salt on walking paths during winter.  Clean up any spills in your garage right away. This includes oil or grease spills. What can I do in the bathroom?  Use night lights.  Install grab bars by the toilet and in the tub and shower. Do not use towel bars as grab bars.  Use non-skid mats or decals in the tub or shower.  If you need to sit down in the shower, use a plastic, non-slip stool.  Keep the floor dry. Clean up any water that spills on the floor as soon as it happens.  Remove soap buildup in the tub or shower regularly.  Attach bath mats securely with double-sided non-slip rug  tape.  Do not have throw rugs and other things on the floor that can make you trip. What can I do in the bedroom?  Use night lights.  Make sure that you have a light by your bed that is easy to reach.  Do not use any sheets or blankets that are too big for your bed. They should not hang down onto the floor.  Have a firm chair that has side arms. You can use this for support while you get dressed.  Do not have throw rugs and other things on the floor that can make you trip. What can I do in the kitchen?  Clean up any spills right away.  Avoid walking on wet floors.  Keep items that you use a lot in easy-to-reach places.  If you need to reach something above you, use a strong step stool that has a grab bar.  Keep electrical cords out of the way.  Do not use floor polish or wax that makes floors slippery. If you must use wax, use non-skid floor wax.  Do not have throw rugs and other things on the floor that  can make you trip. What can I do with my stairs?  Do not leave any items on the stairs.  Make sure that there are handrails on both sides of the stairs and use them. Fix handrails that are broken or loose. Make sure that handrails are as long as the stairways.  Check any carpeting to make sure that it is firmly attached to the stairs. Fix any carpet that is loose or worn.  Avoid having throw rugs at the top or bottom of the stairs. If you do have throw rugs, attach them to the floor with carpet tape.  Make sure that you have a light switch at the top of the stairs and the bottom of the stairs. If you do not have them, ask someone to add them for you. What else can I do to help prevent falls?  Wear shoes that:  Do not have high heels.  Have rubber bottoms.  Are comfortable and fit you well.  Are closed at the toe. Do not wear sandals.  If you use a stepladder:  Make sure that it is fully opened. Do not climb a closed stepladder.  Make sure that both sides of the  stepladder are locked into place.  Ask someone to hold it for you, if possible.  Clearly mark and make sure that you can see:  Any grab bars or handrails.  First and last steps.  Where the edge of each step is.  Use tools that help you move around (mobility aids) if they are needed. These include:  Canes.  Walkers.  Scooters.  Crutches.  Turn on the lights when you go into a dark area. Replace any light bulbs as soon as they burn out.  Set up your furniture so you have a clear path. Avoid moving your furniture around.  If any of your floors are uneven, fix them.  If there are any pets around you, be aware of where they are.  Review your medicines with your doctor. Some medicines can make you feel dizzy. This can increase your chance of falling. Ask your doctor what other things that you can do to help prevent falls. This information is not intended to replace advice given to you by your health care provider. Make sure you discuss any questions you have with your health care provider. Document Released: 10/25/2008 Document Revised: 06/06/2015 Document Reviewed: 02/02/2014 Elsevier Interactive Patient Education  2017 [employer].

## 2019-06-09 NOTE — Progress Notes (Signed)
Subjective:   Alison [name] is a 74 y.o. female who presents for Medicare Annual (Subsequent) preventive examination.  Review of Systems:  Cardiac Risk Factors include: advanced age (>50men, >30 women);diabetes mellitus;dyslipidemia;hypertension;obesity (BMI >30kg/m2);smoking/ tobacco exposure     Objective:     Vitals: There were no vitals taken for this visit.  There is no height or weight on file to calculate BMI.  Advanced Directives 06/09/2019 05/22/2019 04/10/2019 03/17/2019 03/10/2019 02/22/2019 02/10/2019  Does Patient Have a Medical Advance Directive? No Yes Yes No Yes Yes No  Type of Advance Directive - Living will Living will - Living will Living will -  Does patient want to make changes to medical advance directive? No - Patient declined No - Patient declined No - Patient declined - No - Guardian declined - -  Would patient like information on creating a medical advance directive? - - - No - Patient declined - - No - Patient declined  Pre-existing out of facility DNR order (yellow form or pink MOST form) - - - - - - -    Tobacco Social History   Tobacco Use  Smoking Status Former Smoker  . Packs/day: 0.50  . Years: 30.00  . Pack years: 15.00  . Types: Cigarettes  . Quit date: 03/2019  . Years since quitting: 0.2  Smokeless Tobacco Never Used     Counseling given: Not Answered   Clinical Intake:  Pre-visit preparation completed: No  Pain : 0-10 Pain Score: 6  Pain Type: Chronic pain Pain Location: Knee Pain Orientation: Right, Left Pain Radiating Towards: yes to legs Pain Descriptors / Indicators: Aching Pain Onset: Other (comment)(several years) Pain Frequency: Constant Pain Relieving Factors: Meloxicam,Tylenol,Topical ointment,exercise Effect of Pain on Daily Activities: yes slows down.  Pain Relieving Factors: Meloxicam,Tylenol,Topical ointment,exercise  BMI - recorded: 34.88 Nutritional Status: BMI > 30  Obese Nutritional Risks: None Diabetes:  Yes CBG done?: No Did pt. bring in CBG monitor from home?: No  How often do you need to have someone help you when you read instructions, pamphlets, or other written materials from your doctor or pharmacy?: 1 - Never What is the last grade level you completed in school?: 12 Grade  Interpreter Needed?: No  Information entered by :: Tag Wurtz FNP-C  Past Medical History:  Diagnosis Date  . Anxiety   . Benign essential hypertension   . Blood transfusion without reported diagnosis   . Diabetes mellitus without complication ([place])   . Diverticulitis of colon (without mention of hemorrhage)(562.11)   . Goiter, specified as simple   . Hyperlipidemia LDL goal < 100   . Intrahepatic bile duct dilation   . Numbness    TOES RT FOOT and toes left foot  . Obesity, unspecified   . Osteoarthrosis, unspecified whether generalized or localized, lower leg   . Osteopenia   . Other specified disease of nail    FUNGUS OF FINGERNAILS  . Pernicious anemia   . Unspecified glaucoma(365.9)   . Unspecified vitamin D deficiency   . Wears dentures   . Wears glasses    Past Surgical History:  Procedure Laterality Date  . BACK SURGERY    . CATARACT EXTRACTION     right eye 06/20/15 left eye 09/12/15  . COLECTOMY  2009  . COLONOSCOPY  09/06/2007   Dr Carol Ada  . DILATION AND CURETTAGE OF UTERUS    . ENDOSCOPIC RETROGRADE CHOLANGIOPANCREATOGRAPHY (ERCP) WITH PROPOFOL N/A 03/17/2019   Procedure: ENDOSCOPIC RETROGRADE CHOLANGIOPANCREATOGRAPHY (ERCP) WITH PROPOFOL;  Surgeon:  Ladene Artist, MD;  Location: [facility] ENDOSCOPY;  Service: Endoscopy;  Laterality: N/A;  . JOINT REPLACEMENT     right  . LUMBAR LAMINECTOMY/DECOMPRESSION MICRODISCECTOMY N/A 03/30/2013   Procedure: CENTRAL DECOMPRESSION L4 - L5 AND EXCISION 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;  Surgeon: Tobi Bastos, MD;  Location: [facility] ORS;  Service: Orthopedics;  Laterality: N/A;  . MULTIPLE TOOTH EXTRACTIONS    . REMOVAL OF STONES  03/17/2019    Procedure: REMOVAL OF STONES;  Surgeon: Ladene Artist, MD;  Location: [facility] END[facility]COPY;  Service: Endoscopy;;  . SIGMOIDOSCOPY  02/16/2018   Dr Fuller Plan  . SPHINCTEROTOMY  03/17/2019   Procedure: SPHINCTEROTOMY;  Surgeon: Ladene Artist, MD;  Location: [place];  Service: Endoscopy;;  . TOTAL KNEE ARTHROPLASTY Right 02/01/2013   Procedure: RIGHT TOTAL KNEE ARTHROPLASTY;  Surgeon: Tobi Bastos, MD;  Location: [facility];  Service: Orthopedics;  Laterality: Right;  . TOTAL KNEE ARTHROPLASTY Left 04/18/2014   Procedure: LEFT TOTAL KNEE ARTHROPLASTY;  Surgeon: Latanya Maudlin, MD;  Location: [facility];  Service: Orthopedics;  Laterality: Left;   Family History  Problem Relation Age of Onset  . Alzheimer's disease Mother   . Stroke Mother   . Hyperlipidemia Mother   . Heart disease Father   . Diabetes Sister   . Colon polyps Neg Hx   . Colon cancer Neg Hx   . Esophageal cancer Neg Hx   . Rectal cancer Neg Hx   . Stomach cancer Neg Hx    Social History   Socioeconomic History  . Marital status: Divorced    Spouse name: Not on file  . Number of children: Not on file  . Years of education: Not on file  . Highest education level: Not on file  Occupational History  . Not on file  Tobacco Use  . Smoking status: Former Smoker    Packs/day: 0.50    Years: 30.00    Pack years: 15.00    Types: Cigarettes    Quit date: 03/2019    Years since quitting: 0.2  . Smokeless tobacco: Never Used  Substance and Sexual Activity  . Alcohol use: No  . Drug use: No  . Sexual activity: Not Currently  Other Topics Concern  . Not on file  Social History Narrative  . Not on file   Social Determinants of Health   Financial Resource Strain:   . Difficulty of Paying Living Expenses:   Food Insecurity:   . Worried About Charity fundraiser in the Last Year:   . Arboriculturist in the Last Year:   Transportation Needs:   . Film/video editor (Medical):   Marland Kitchen Lack of Transportation (Non-Medical):    Physical Activity:   . Days of Exercise per Week:   . Minutes of Exercise per Session:   Stress:   . Feeling of Stress :   Social Connections:   . Frequency of Communication with Friends and Family:   . Frequency of Social Gatherings with Friends and Family:   . Attends Religious Services:   . Active Member of Clubs or Organizations:   . Attends Archivist Meetings:   Marland Kitchen Marital Status:     Outpatient Encounter Medications as of 06/09/2019  Medication Sig  . acetaminophen (TYLENOL) 650 MG CR tablet Take 1,300 mg by mouth in the morning and at bedtime.  Marland Kitchen aspirin EC 81 MG tablet Take 81 mg by mouth daily.  Marland Kitchen atorvastatin (LIPITOR) 40 MG tablet  Take 1 tablet (40 mg total) by mouth daily.  . Calcium Carb-Cholecalciferol (CALCIUM 600 + D PO) Take 1 tablet by mouth daily.  . carboxymethylcellulose (REFRESH PLUS) 0.5 % SOLN Place 1 drop into both eyes 3 (three) times daily as needed (dry eyes).   . Cholecalciferol (VITAMIN D-3) 1000 UNITS CAPS Take 1,000 Units by mouth daily.   . COMBIGAN 0.2-0.5 % ophthalmic solution Place 1 drop into both eyes in the morning and at bedtime.   . gabapentin (NEURONTIN) 300 MG capsule Take 1 capsule (300 mg total) by mouth at bedtime.  . Homeopathic Products (THERAWORX RELIEF EX) Apply 1 application topically daily as needed (pain).  Marland Kitchen latanoprost (XALATAN) 0.005 % ophthalmic solution Place 1 drop into both eyes at bedtime.  Marland Kitchen lisinopril (ZESTRIL) 10 MG tablet Take 1 tablet (10 mg total) by mouth daily.  . meloxicam (MOBIC) 15 MG tablet Take 15 mg by mouth daily.   . Menthol, Topical Analgesic, (ICY HOT EX) Apply 1 application topically daily as needed (pain).  . metFORMIN (GLUCOPHAGE) 500 MG tablet TAKE 1/2 (ONE-HALF) TABLET BY MOUTH IN THE EVENING WITH SUPPER  . Multiple Vitamin (MULTIVITAMIN) tablet Take 1 tablet by mouth daily.  . Omega-3 Fatty Acids (OMEGA 3 PO) Take 1,200 mg by mouth daily.  Marland Kitchen trolamine salicylate (ASPERCREME) 10 % cream  Apply 1 application topically as needed for muscle pain.   No facility-administered encounter medications on file as of 06/09/2019.    Activities of Daily Living In your present state of health, do you have any difficulty performing the following activities: 06/09/2019  Hearing? N  Vision? N  Difficulty concentrating or making decisions? Y  Comment Forgetful  Walking or climbing stairs? Y  Comment knee pain  Dressing or bathing? N  Doing errands, shopping? N  Comment sister Restaurant manager, fast food and eating ? N  Using the Toilet? N  In the past six months, have you accidently leaked urine? N  Do you have problems with loss of bowel control? N  Managing your Medications? N  Managing your Finances? N  Housekeeping or managing your Housekeeping? N  Some recent data might be hidden    Patient Care Team: Gayland Curry, DO as PCP - General (Geriatric Medicine) Shirley Muscat Loreen Freud, MD as Referring Physician (Optometry) Latanya Maudlin, MD as Consulting Physician (Orthopedic Surgery) Warden Fillers, MD as Consulting Physician (Ophthalmology) Ladene Artist, MD as Consulting Physician (Gastroenterology) Abbie Sons, MD (Urology)    Assessment:   This is a routine wellness examination for [place].  Exercise Activities and Dietary recommendations Current Exercise Habits: Home exercise routine, Type of exercise: Other - see comments(stationary bike), Time (Minutes): 15, Frequency (Times/Week): 7, Weekly Exercise (Minutes/Week): 105, Intensity: Mild, Exercise limited by: Other - see comments(bilateral knee pain)  Goals    . Quit Smoking     Patient will try to incorporate new activites and hobbies to help decrease smoking    . Quit smoking / using tobacco    . Weight < 200 lb (90.719 kg)     Try to get back down below 200 lbs for next visit.  Continue cycling and try to eat better (with handouts provided).       Fall Risk Fall Risk  06/09/2019 05/22/2019 04/10/2019  03/10/2019 02/10/2019  Falls in the past year? 0 0 0 0 0  Number falls in past yr: 0 0 0 0 0  Injury with Fall? 0 - 0 0 0  Risk for fall  due to : - - - - -   Is the patient's home free of loose throw rugs in walkways, pet beds, electrical cords, etc?   no      Grab bars in the bathroom? yes      Handrails on the stairs?   no steps      Adequate lighting?   yes   Depression Screen PHQ 2/9 Scores 06/09/2019 04/10/2019 03/10/2019 02/10/2019  PHQ - 2 Score 0 0 0 0  Exception Documentation - - - -     Cognitive Function MMSE - Mini Mental State Exam 04/26/2017 02/03/2016  Orientation to time 5 5  Orientation to Place 5 5  Registration 3 3  Attention/ Calculation 5 5  Recall 3 2  Language- name 2 objects 2 2  Language- repeat 1 1  Language- follow 3 step command 3 3  Language- read & follow direction 1 1  Write a sentence 1 1  Copy design 1 1  Total score 30 29     6CIT Screen 06/09/2019 06/07/2018 06/07/2018  What Year? 0 points 0 points 0 points  What month? 0 points 0 points 0 points  What time? 0 points 0 points 0 points  Count back from 20 0 points 0 points 0 points  Months in reverse 0 points 0 points 0 points  Repeat phrase 6 points 0 points 0 points  Total Score 6 0 0    Immunization History  Administered Date(s) Administered  . Fluad Quad(high Dose 65+) 09/05/2018  . Influenza, High Dose Seasonal PF 10/02/2017  . Influenza,inj,Quad PF,6+ Mos 10/03/2012, 09/21/2014, 11/07/2015  . Influenza,inj,quad, With Preservative 11/12/2016  . Influenza-Unspecified 10/28/2013  . PFIZER SARS-COV-2 Vaccination 03/25/2019, 03/25/2019, 04/18/2019  . Pneumococcal Conjugate-13 01/04/2014, 01/04/2014, 11/04/2016  . Pneumococcal Polysaccharide-23 12/29/2012, 11/19/2017  . Zoster Recombinat (Shingrix) 11/25/2016, 01/26/2017    Qualifies for Shingles Vaccine? Completed   Screening Tests Health Maintenance  Topic Date Due  . FOOT EXAM  12/28/2017  . INFLUENZA VACCINE  08/13/2019  .  HEMOGLOBIN A1C  11/29/2019  . OPHTHALMOLOGY EXAM  05/15/2020  . MAMMOGRAM  07/28/2020  . COLONOSCOPY  02/17/2023  . TETANUS/TDAP  10/29/2023  . DEXA SCAN  Completed  . COVID-19 Vaccine  Completed  . Hepatitis C Screening  Completed  . PNA vac Low Risk Adult  Completed    Cancer Screenings: Lung: Low Dose CT Chest recommended if Age 13-80 years, 30 pack-year currently smoking OR have quit w/in 15years. Patient does not qualify. Breast:  Up to date on Mammogram? Yes   Up to date of Bone Density/Dexa? Yes Colorectal:Up to date   Additional Screenings: Hepatitis C Screening: Completed      Plan:  - Referral Podiatry  I have personally reviewed and noted the following in the patient's chart:   . Medical and social history . Use of alcohol, tobacco or illicit drugs  . Current medications and supplements . Functional ability and status . Nutritional status . Physical activity . Advanced directives . List of other physicians . Hospitalizations, surgeries, and ER visits in previous 12 months . Vitals . Screenings to include cognitive, depression, and falls . Referrals and appointments  In addition, I have reviewed and discussed with patient certain preventive protocols, quality metrics, and best practice recommendations. A written personalized care plan for preventive services as well as general preventive health recommendations were provided to patient.     Sandrea Hughs, NP  06/09/2019

## 2019-06-14 ENCOUNTER — Ambulatory Visit: Payer: Medicare Other | Attending: Internal Medicine | Admitting: Physical Therapy

## 2019-06-14 ENCOUNTER — Other Ambulatory Visit: Payer: Self-pay

## 2019-06-14 ENCOUNTER — Encounter: Payer: Self-pay | Admitting: Physical Therapy

## 2019-06-14 DIAGNOSIS — M25562 Pain in left knee: Secondary | ICD-10-CM | POA: Insufficient documentation

## 2019-06-14 DIAGNOSIS — M25561 Pain in right knee: Secondary | ICD-10-CM | POA: Diagnosis present

## 2019-06-14 DIAGNOSIS — R262 Difficulty in walking, not elsewhere classified: Secondary | ICD-10-CM | POA: Diagnosis present

## 2019-06-14 DIAGNOSIS — R208 Other disturbances of skin sensation: Secondary | ICD-10-CM

## 2019-06-14 DIAGNOSIS — R2681 Unsteadiness on feet: Secondary | ICD-10-CM | POA: Diagnosis not present

## 2019-06-14 DIAGNOSIS — G8929 Other chronic pain: Secondary | ICD-10-CM | POA: Diagnosis present

## 2019-06-14 DIAGNOSIS — R209 Unspecified disturbances of skin sensation: Secondary | ICD-10-CM | POA: Diagnosis present

## 2019-06-14 NOTE — Therapy (Signed)
[facility] [address] [place], [place], [address] Phone: [phone]   Fax:  [phone]  Physical Therapy Treatment  Patient Details  Name: Alison [name] MRN: [id number] Date of Birth: 1945-08-10 Referring Provider (PT): Gayland Curry, DO   Encounter Date: 06/14/2019  PT End of Session - 06/14/19 1015    Visit Number  14    Number of Visits  17    Date for PT Re-Evaluation  07/07/19    Authorization Type  Medicare Primary/[facility]I Secondary - 10th visit PN    Progress Note Due on Visit  20    PT Start Time  0937    PT Stop Time  1015    PT Time Calculation (min)  38 min    Activity Tolerance  Patient tolerated treatment well    Behavior During Therapy  [facility] for tasks assessed/performed       Past Medical History:  Diagnosis Date  . Anxiety   . Benign essential hypertension   . Blood transfusion without reported diagnosis   . Diabetes mellitus without complication ([place])   . Diverticulitis of colon (without mention of hemorrhage)(562.11)   . Goiter, specified as simple   . Hyperlipidemia LDL goal < 100   . Intrahepatic bile duct dilation   . Numbness    TOES RT FOOT and toes left foot  . Obesity, unspecified   . Osteoarthrosis, unspecified whether generalized or localized, lower leg   . Osteopenia   . Other specified disease of nail    FUNGUS OF FINGERNAILS  . Pernicious anemia   . Unspecified glaucoma(365.9)   . Unspecified vitamin D deficiency   . Wears dentures   . Wears glasses     Past Surgical History:  Procedure Laterality Date  . BACK SURGERY    . CATARACT EXTRACTION     right eye 06/20/15 left eye 09/12/15  . COLECTOMY  2009  . COLONOSCOPY  09/06/2007   Dr Carol Ada  . DILATION AND CURETTAGE OF UTERUS    . ENDOSCOPIC RETROGRADE CHOLANGIOPANCREATOGRAPHY (ERCP) WITH PROPOFOL N/A 03/17/2019   Procedure: ENDOSCOPIC RETROGRADE CHOLANGIOPANCREATOGRAPHY (ERCP) WITH PROPOFOL;  Surgeon: Ladene Artist, MD;  Location: [facility] ENDOSCOPY;  Service: Endoscopy;  Laterality: N/A;  . JOINT REPLACEMENT     right  . LUMBAR LAMINECTOMY/DECOMPRESSION MICRODISCECTOMY N/A 03/30/2013   Procedure: CENTRAL DECOMPRESSION L4 - L5 AND EXCISION 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;  Surgeon: Tobi Bastos, MD;  Location: [facility];  Service: Orthopedics;  Laterality: N/A;  . MULTIPLE TOOTH EXTRACTIONS    . REMOVAL OF STONES  03/17/2019   Procedure: REMOVAL OF STONES;  Surgeon: Ladene Artist, MD;  Location: [facility] ENDOSCOPY;  Service: Endoscopy;;  . SIGMOIDOSCOPY  02/16/2018   Dr Fuller Plan  . SPHINCTEROTOMY  03/17/2019   Procedure: SPHINCTEROTOMY;  Surgeon: Ladene Artist, MD;  Location: [facility];  Service: Endoscopy;;  . TOTAL KNEE ARTHROPLASTY Right 02/01/2013   Procedure: RIGHT TOTAL KNEE ARTHROPLASTY;  Surgeon: Tobi Bastos, MD;  Location: [facility];  Service: Orthopedics;  Laterality: Right;  . TOTAL KNEE ARTHROPLASTY Left 04/18/2014   Procedure: LEFT TOTAL KNEE ARTHROPLASTY;  Surgeon: Latanya Maudlin, MD;  Location: [facility];  Service: Orthopedics;  Laterality: Left;    There were no vitals filed for this visit.  Subjective Assessment - 06/14/19 0944    Subjective  LE feeling good today.  Had a good visit with younger sister for two weeks.  Brought schedule to be able to  set up last two visits.    Pertinent History  anxiety, HTN, DM, HLD, diverticulitis, obesity, OA with bilat TKA, osteopenia, glaucoma, colectomy, lumbar laminectomy/decompression and cataract extraction    Patient Stated Goals  Help the pain and help the balance    Currently in Pain?  Yes                        [facility] Adult PT Treatment/Exercise - 06/14/19 0945      Knee/Hip Exercises: Stretches   ITB Stretch  Right;Left;2 reps;20 seconds    ITB Stretch Limitations  straight leg across the body in supine      Knee/Hip Exercises: Standing   Lateral Step Up  Both;1 set;10 reps    Lateral Step Up Limitations  on 4" step with  blue compliant mat on top for more compliant surface, min A    Forward Step Up  Both;2 sets;10 reps;Hand Hold: 2;Step Height: 6";Other (comment)    Forward Step Up Limitations  onto 4" step with blue foam on top for compliant; performed alternating step ups forwards      Knee/Hip Exercises: Sidelying   Hip ABduction  Strengthening;Right;Left;10 reps;4 sets    Hip ABduction Limitations  with LE straight supported on bolster performed one set x 10 reps open chain ABD; open chain ABD with 5 reps each forwards and backwards small circles x 2 sets; 1 set of open chain ABD with 5 reps hip flexion <> extension with knee straight.  Performed on both sides.             PT Education - 06/14/19 1015    Education Details  added two more visits    Person(s) Educated  Patient    Methods  Explanation    Comprehension  Verbalized understanding       PT Short Term Goals - 06/02/19 1032      PT SHORT TERM GOAL #1   Title  Pt will demonstrate independence with ongoing HEP and walking program for aerobic conditioning    Time  4    Period  Weeks    Status  Achieved    Target Date  06/07/19      PT SHORT TERM GOAL #2   Title  Pt will decrease time to perform five time sit to stand to </= 17 seconds    Baseline  Five time sit to stand is now [facility]: 12 seconds    Status  Achieved      PT SHORT TERM GOAL #3   Title  Pt will increase BERG score to >/= 51/56 to indicate decreased falls risk    Baseline  51/56    Time  4    Period  Weeks    Status  Achieved    Target Date  06/07/19      PT SHORT TERM GOAL #4   Title  Pt will improve DGI to >/= 20/24    Baseline  22/24    Time  4    Period  Weeks    Status  Achieved    Target Date  06/07/19      PT SHORT TERM GOAL #5   Title  Pt will ambulate x 500' over paved outdoor surfaces, negotiate curb with cane and supervision    Time  4    Period  Weeks    Status  Achieved    Target Date  06/07/19        PT Long Term  Goals - 05/09/19 1538       PT LONG TERM GOAL #1   Title  Pt will demonstrate independence with final LE strength/balance HEP, biking program and community wellness plan    Time  8    Period  Weeks    Status  Revised    Target Date  07/07/19      PT LONG TERM GOAL #3   Title  Pt will increase BERG to >/= 53/56 to indicate decreased falls risk    Time  8    Period  Weeks    Status  Revised    Target Date  07/07/19      PT LONG TERM GOAL #4   Title  Pt will increase DGI to >/= 22/24 with cane to indicate decreased risk for falls in community    Time  8    Period  Weeks    Status  Revised    Target Date  07/07/19      PT LONG TERM GOAL #5   Title  Pt will negotiate ramp and 5 stairs with cane MOD I for safe home entry/exit    Baseline  --    Time  8    Period  Weeks    Status  Revised    Target Date  07/07/19            Plan - 06/14/19 1508    Clinical Impression Statement  Continued to focus on glute med and proximal hip strengthening with open chain exercises against resistance of gravity and with closed chain step ups.  Added blue foam to step ups for greater balance challenge on compliant surface.  Pt tolerated well with no increase in knee pain.  Will continue to progress towards LTG.    Personal Factors and Comorbidities  Comorbidity 3+;Fitness;Social Background;Transportation    Comorbidities  anxiety, HTN, DM, HLD, diverticulitis, obesity, OA with bilat TKA, osteopenia, glaucoma, colectomy, lumbar laminectomy/decompression and cataract extraction    Examination-Activity Limitations  Caring for Others;Locomotion Level;Stairs;Stand    Examination-Participation Restrictions  Cleaning;Community Activity;Laundry;Meal Prep    Rehab Potential  Good    PT Frequency  1x / week    PT Duration  8 weeks    PT Treatment/Interventions  ADLs/Self Care Home Management;Aquatic Therapy;Electrical Stimulation;Cryotherapy;Moist Heat;DME Instruction;Gait training;Stair training;Functional mobility  training;Therapeutic activities;Therapeutic exercise;Balance training;Neuromuscular re-education;Patient/family education;Orthotic Fit/Training;Passive range of motion;Dry needling;Taping    PT Next Visit Plan  Warm up on Nustep, levle 6 x 8-10 minutes.  Hip abduction strengthening - add sidelying exercises to HEP.  Lateral rockerboard. Working on turns, standing balance with narrow BOS, compliant surface, eyes closed, SLS training    PT Home Exercise Plan  Access Code: [email or web address]    Consulted and Agree with Plan of Care  Patient       Patient will benefit from skilled therapeutic intervention in order to improve the following deficits and impairments:  Decreased balance, Decreased activity tolerance, Decreased strength, Difficulty walking, Impaired sensation, Pain  Visit Diagnosis: Unsteadiness on feet  Other disturbances of skin sensation  Difficulty in walking, not elsewhere classified  Chronic pain of left knee  Chronic pain of right knee     Problem List Patient Active Problem List   Diagnosis Date Noted  . Calculus of bile duct without cholangitis with obstruction   . Renal calcification 03/10/2019  . Tobacco use disorder, continuous 03/10/2019  . Transaminitis 02/04/2019  . Abnormal ultrasound of liver 02/04/2019  . Sessile rectal polyp 05/02/2018  . Internal  hemorrhoids without complication 99991111  . Diverticulosis 05/02/2018  . 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 Chronic pain of both knees 08/19/2017  . Cataracts, bilateral 07/08/2015  . Hyperlipidemia associated with type 2 diabetes mellitus ([place]) 04/27/2015  . History of total knee arthroplasty 04/18/2014  . History of lumbar laminectomy for spinal cord decompression 05/04/2013  . Synovial cyst of lumbar spine 03/31/2013  . Spinal stenosis of lumbar region with neurogenic claudication 03/30/2013  . Synovial cyst of lumbar facet joint 03/30/2013  . Type 2 diabetes mellitus with  neurological complications ([place]) [id number]  . Osteoarthritis of right knee 02/01/2013  . History of total knee replacement 02/01/2013  . Tobacco abuse 10/03/2012  . Hyperglycemia 10/03/2012  . Unspecified vitamin D deficiency   . Pernicious anemia   . Glaucoma (increased eye pressure)   . Benign essential hypertension   . Osteopenia     Rico Junker, PT, DPT 06/14/19    3:13 PM    [facility] [address] [place], [place], [address] Phone: [phone]   Fax:  [phone]  Name: Breanah Alexandra MRN: [id number] Date of Birth: September 27, 1945

## 2019-06-21 ENCOUNTER — Other Ambulatory Visit: Payer: Self-pay

## 2019-06-21 ENCOUNTER — Ambulatory Visit: Payer: Medicare Other | Admitting: Physical Therapy

## 2019-06-21 VITALS — BP 110/70

## 2019-06-21 DIAGNOSIS — R262 Difficulty in walking, not elsewhere classified: Secondary | ICD-10-CM

## 2019-06-21 DIAGNOSIS — R2681 Unsteadiness on feet: Secondary | ICD-10-CM

## 2019-06-21 DIAGNOSIS — M25561 Pain in right knee: Secondary | ICD-10-CM

## 2019-06-21 DIAGNOSIS — R208 Other disturbances of skin sensation: Secondary | ICD-10-CM

## 2019-06-21 DIAGNOSIS — M25562 Pain in left knee: Secondary | ICD-10-CM

## 2019-06-21 NOTE — Patient Instructions (Addendum)
Access Code: [email or web address] URL: [email or web address] Date: 06/21/2019 Prepared by: Misty Stanley  Exercises Heel Toe Raises with Counter Support - 1 x daily - 7 x weekly - 2 sets - 10 reps Standing Hip Extension with Resistance at Ankles and Counter Support - 1 x daily - 7 x weekly - 2 sets - 10 reps Hip and Knee Flexion with Anchored Resistance - 1 x daily - 7 x weekly - 2 sets - 10 second hold Standing Hamstring Stretch with Step - 1 x daily - 7 x weekly - 2 sets - 30 second hold Sidelying Hip Abduction - 1 x daily - 7 x weekly - 2 sets - 10 reps Sidelying Hip Extension in Abduction - 1 x daily - 7 x weekly - 2 sets - 10 reps Seated Figure 4 Piriformis Stretch - 1 x daily - 7 x weekly - 2 sets - 30 seconds hold Tandem Stance with Support - 1 x daily - 7 x weekly - 2 sets - 10 second hold

## 2019-06-21 NOTE — Therapy (Addendum)
[facility] [address] [place], [place], [address] Phone: [phone]   Fax:  [phone]  Physical Therapy Treatment  Patient Details  Name: Alison [name] MRN: [id number] Date of Birth: 1945-09-28 Referring Provider (PT): Gayland Curry, DO   Encounter Date: 06/21/2019  PT End of Session - 06/21/19 1019    Visit Number  15    Number of Visits  17    Date for PT Re-Evaluation  07/07/19    Authorization Type  Medicare Primary/GHI Secondary - 10th visit PN    Progress Note Due on Visit  20    PT Start Time  0937    PT Stop Time  1017    PT Time Calculation (min)  40 min    Activity Tolerance  Patient tolerated treatment well    Behavior During Therapy  [facility] for tasks assessed/performed       Past Medical History:  Diagnosis Date  . Anxiety   . Benign essential hypertension   . Blood transfusion without reported diagnosis   . Diabetes mellitus without complication ([place])   . Diverticulitis of colon (without mention of hemorrhage)(562.11)   . Goiter, specified as simple   . Hyperlipidemia LDL goal < 100   . Intrahepatic bile duct dilation   . Numbness    TOES RT FOOT and toes left foot  . Obesity, unspecified   . Osteoarthrosis, unspecified whether generalized or localized, lower leg   . Osteopenia   . Other specified disease of nail    FUNGUS OF FINGERNAILS  . Pernicious anemia   . Unspecified glaucoma(365.9)   . Unspecified vitamin D deficiency   . Wears dentures   . Wears glasses     Past Surgical History:  Procedure Laterality Date  . BACK SURGERY    . CATARACT EXTRACTION     right eye 06/20/15 left eye 09/12/15  . COLECTOMY  2009  . COLONOSCOPY  09/06/2007   Dr Carol Ada  . DILATION AND CURETTAGE OF UTERUS    . ENDOSCOPIC RETROGRADE CHOLANGIOPANCREATOGRAPHY (ERCP) WITH PROPOFOL N/A 03/17/2019   Procedure: ENDOSCOPIC RETROGRADE CHOLANGIOPANCREATOGRAPHY (ERCP) WITH PROPOFOL;  Surgeon: Ladene Artist, MD;  Location: [facility] ENDOSCOPY;  Service: Endoscopy;  Laterality: N/A;  . JOINT REPLACEMENT     right  . LUMBAR LAMINECTOMY/DECOMPRESSION MICRODISCECTOMY N/A 03/30/2013   Procedure: CENTRAL DECOMPRESSION L4 - L5 AND EXCISION 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;  Surgeon: Tobi Bastos, MD;  Location: [facility];  Service: Orthopedics;  Laterality: N/A;  . MULTIPLE TOOTH EXTRACTIONS    . REMOVAL OF STONES  03/17/2019   Procedure: REMOVAL OF STONES;  Surgeon: Ladene Artist, MD;  Location: [facility] ENDOSCOPY;  Service: Endoscopy;;  . SIGMOIDOSCOPY  02/16/2018   Dr Fuller Plan  . SPHINCTEROTOMY  03/17/2019   Procedure: SPHINCTEROTOMY;  Surgeon: Ladene Artist, MD;  Location: [place];  Service: Endoscopy;;  . TOTAL KNEE ARTHROPLASTY Right 02/01/2013   Procedure: RIGHT TOTAL KNEE ARTHROPLASTY;  Surgeon: Tobi Bastos, MD;  Location: [facility];  Service: Orthopedics;  Laterality: Right;  . TOTAL KNEE ARTHROPLASTY Left 04/18/2014   Procedure: LEFT TOTAL KNEE ARTHROPLASTY;  Surgeon: Latanya Maudlin, MD;  Location: [facility];  Service: Orthopedics;  Laterality: Left;    Vitals:   06/21/19 0939  BP: 110/70    Subjective Assessment - 06/21/19 0942    Subjective  Doing well, "please take my BP today."    Pertinent History  anxiety, HTN, DM, HLD, diverticulitis, obesity, OA  with bilat TKA, osteopenia, glaucoma, colectomy, lumbar laminectomy/decompression and cataract extraction    Patient Stated Goals  Help the pain and help the balance    Currently in Pain?  Yes    Pain Score  5     Pain Location  Knee                        [facility] Adult PT Treatment/Exercise - 06/21/19 0950      Knee/Hip Exercises: Aerobic   Nustep  level 7 x 3 minutes and then level 6 x 3 minutes more with bilat UE and LE x 60 minutes total at moderate pace (40-60 steps per minute) for aerobic conditioning     Increased resistance to 7 but only able to perform for half the amount of time due to LE fatigue.   Access  Code: [email or web address] URL: [email or web address] Date: 06/21/2019 Prepared by: Misty Stanley  Exercises Heel Toe Raises with Counter Support - 1 x daily - 7 x weekly - 2 sets - 10 reps Standing Hip Extension with Resistance at Ankles and Counter Support - 1 x daily - 7 x weekly - 2 sets - 10 reps Hip and Knee Flexion with Anchored Resistance - 1 x daily - 7 x weekly - 2 sets - 10 second hold Standing Hamstring Stretch with Step - 1 x daily - 7 x weekly - 2 sets - 30 second hold Sidelying Hip Abduction - 1 x daily - 7 x weekly - 2 sets - 10 reps Sidelying Hip Extension in Abduction - 1 x daily - 7 x weekly - 2 sets - 10 reps Seated Figure 4 Piriformis Stretch - 1 x daily - 7 x weekly - 2 sets - 30 seconds hold Tandem Stance with Support - 1 x daily - 7 x weekly - 2 sets - 10 second hold  Added the exercises in bold and removed standing open chain hip ABD with theraband.    PT Education - 06/21/19 1019    Education Details  updated HEP    Person(s) Educated  Patient    Methods  Explanation;Demonstration;Handout    Comprehension  Verbalized understanding;Returned demonstration       PT Short Term Goals - 06/02/19 1032      PT SHORT TERM GOAL #1   Title  Pt will demonstrate independence with ongoing HEP and walking program for aerobic conditioning    Time  4    Period  Weeks    Status  Achieved    Target Date  06/07/19      PT SHORT TERM GOAL #2   Title  Pt will decrease time to perform five time sit to stand to </= 17 seconds    Baseline  Five time sit to stand is now [facility]: 12 seconds    Status  Achieved      PT SHORT TERM GOAL #3   Title  Pt will increase BERG score to >/= 51/56 to indicate decreased falls risk    Baseline  51/56    Time  4    Period  Weeks    Status  Achieved    Target Date  06/07/19      PT SHORT TERM GOAL #4   Title  Pt will improve DGI to >/= 20/24    Baseline  22/24    Time  4    Period  Weeks    Status  Achieved    Target Date  06/07/19  PT SHORT TERM GOAL #5   Title  Pt will ambulate x 500' over paved outdoor surfaces, negotiate curb with cane and supervision    Time  4    Period  Weeks    Status  Achieved    Target Date  06/07/19        PT Long Term Goals - 05/09/19 1538      PT LONG TERM GOAL #1   Title  Pt will demonstrate independence with final LE strength/balance HEP, biking program and community wellness plan    Time  8    Period  Weeks    Status  Revised    Target Date  07/07/19      PT LONG TERM GOAL #3   Title  Pt will increase BERG to >/= 53/56 to indicate decreased falls risk    Time  8    Period  Weeks    Status  Revised    Target Date  07/07/19      PT LONG TERM GOAL #4   Title  Pt will increase DGI to >/= 22/24 with cane to indicate decreased risk for falls in community    Time  8    Period  Weeks    Status  Revised    Target Date  07/07/19      PT LONG TERM GOAL #5   Title  Pt will negotiate ramp and 5 stairs with cane MOD I for safe home entry/exit    Baseline  --    Time  8    Period  Weeks    Status  Revised    Target Date  07/07/19            Plan - 06/21/19 1141    Clinical Impression Statement  Continued to progress aerobic conditioning and LE strengthening and added sidelying hip ABD and tandem stance to HEP to continue glute med strengthening at home.  Pt tolerated well with no increase in knee pain.    Personal Factors and Comorbidities  Comorbidity 3+;Fitness;Social Background;Transportation    Comorbidities  anxiety, HTN, DM, HLD, diverticulitis, obesity, OA with bilat TKA, osteopenia, glaucoma, colectomy, lumbar laminectomy/decompression and cataract extraction    Examination-Activity Limitations  Caring for Others;Locomotion Level;Stairs;Stand    Examination-Participation Restrictions  Cleaning;Community Activity;Laundry;Meal Prep    Rehab Potential  Good    PT Frequency  1x / week    PT Duration  8 weeks    PT Treatment/Interventions  ADLs/Self Care Home  Management;Electrical Stimulation;Cryotherapy;Moist Heat;DME Instruction;Gait training;Stair training;Functional mobility training;Therapeutic activities;Therapeutic exercise;Balance training;Neuromuscular re-education;Patient/family education;Orthotic Fit/Training;Passive range of motion;Dry needling;Taping    PT Next Visit Plan  LTG to be assessed 6/24 and send to me for D/C.   Warm up on Nustep, level 6 x 8-10 minutes.  Step ups on compliant surface, blue balance beam, sit <> stand on compliant.  Lateral rockerboard. Working on turns, standing balance with narrow BOS, compliant surface, eyes closed, SLS training    PT Home Exercise Plan  Access Code: [email or web address]    Consulted and Agree with Plan of Care  Patient       Patient will benefit from skilled therapeutic intervention in order to improve the following deficits and impairments:  Decreased balance, Decreased activity tolerance, Decreased strength, Difficulty walking, Impaired sensation, Pain  Visit Diagnosis: Unsteadiness on feet  Other disturbances of skin sensation  Difficulty in walking, not elsewhere classified  Chronic pain of left knee  Chronic pain of right knee  Problem List Patient Active Problem List   Diagnosis Date Noted  . Calculus of bile duct without cholangitis with obstruction   . Renal calcification 03/10/2019  . Tobacco use disorder, continuous 03/10/2019  . Transaminitis 02/04/2019  . Abnormal ultrasound of liver 02/04/2019  . Sessile rectal polyp 05/02/2018  . Internal hemorrhoids without complication 32/44/0102  . Diverticulosis 05/02/2018  . 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 Chronic pain of both knees 08/19/2017  . Cataracts, bilateral 07/08/2015  . Hyperlipidemia associated with type 2 diabetes mellitus ([place]) 04/27/2015  . History of total knee arthroplasty 04/18/2014  . History of lumbar laminectomy for spinal cord decompression 05/04/2013  . Synovial cyst of lumbar  spine 03/31/2013  . Spinal stenosis of lumbar region with neurogenic claudication 03/30/2013  . Synovial cyst of lumbar facet joint 03/30/2013  . Type 2 diabetes mellitus with neurological complications ([place]) 72/53/6644  . Osteoarthritis of right knee 02/01/2013  . History of total knee replacement 02/01/2013  . Tobacco abuse 10/03/2012  . Hyperglycemia 10/03/2012  . Unspecified vitamin D deficiency   . Pernicious anemia   . Glaucoma (increased eye pressure)   . Benign essential hypertension   . Osteopenia    Rico Junker, PT, DPT 06/21/19    12:28 PM    [facility] [address] [place], [place], [address] Phone: [phone]   Fax:  [phone]  Name: Alison [name] MRN: [id number] Date of Birth: April 04, 1945

## 2019-06-22 ENCOUNTER — Other Ambulatory Visit: Payer: Self-pay | Admitting: Internal Medicine

## 2019-06-22 DIAGNOSIS — E1169 Type 2 diabetes mellitus with other specified complication: Secondary | ICD-10-CM

## 2019-06-22 NOTE — Telephone Encounter (Signed)
rx sent to pharmacy by e-script  

## 2019-06-29 ENCOUNTER — Other Ambulatory Visit: Payer: Self-pay

## 2019-06-29 ENCOUNTER — Encounter: Payer: Self-pay | Admitting: Physical Therapy

## 2019-06-29 ENCOUNTER — Ambulatory Visit: Payer: Medicare Other | Admitting: Physical Therapy

## 2019-06-29 DIAGNOSIS — R2681 Unsteadiness on feet: Secondary | ICD-10-CM

## 2019-06-29 DIAGNOSIS — R208 Other disturbances of skin sensation: Secondary | ICD-10-CM

## 2019-06-29 DIAGNOSIS — G8929 Other chronic pain: Secondary | ICD-10-CM

## 2019-06-29 DIAGNOSIS — M25562 Pain in left knee: Secondary | ICD-10-CM

## 2019-06-29 DIAGNOSIS — R262 Difficulty in walking, not elsewhere classified: Secondary | ICD-10-CM

## 2019-06-30 NOTE — Therapy (Signed)
[facility] [address] [place], [place], [address] Phone: [phone]   Fax:  [phone]  Physical Therapy Treatment  Patient Details  Name: Alison [name] MRN: [id number] Date of Birth: 10-19-1945 Referring Provider (PT): Gayland Curry, DO   Encounter Date: 06/29/2019   PT End of Session - 06/29/19 0937    Visit Number 16    Number of Visits 17    Date for PT Re-Evaluation 07/07/19    Authorization Type Medicare Primary/GHI Secondary - 10th visit PN    Progress Note Due on Visit 20    PT Start Time 0933    PT Stop Time 1012    PT Time Calculation (min) 39 min    Equipment Utilized During Treatment Gait belt    Activity Tolerance Patient tolerated treatment well    Behavior During Therapy [facility] for tasks assessed/performed           Past Medical History:  Diagnosis Date  . Anxiety   . Benign essential hypertension   . Blood transfusion without reported diagnosis   . Diabetes mellitus without complication ([place])   . Diverticulitis of colon (without mention of hemorrhage)(562.11)   . Goiter, specified as simple   . Hyperlipidemia LDL goal < 100   . Intrahepatic bile duct dilation   . Numbness    TOES RT FOOT and toes left foot  . Obesity, unspecified   . Osteoarthrosis, unspecified whether generalized or localized, lower leg   . Osteopenia   . Other specified disease of nail    FUNGUS OF FINGERNAILS  . Pernicious anemia   . Unspecified glaucoma(365.9)   . Unspecified vitamin D deficiency   . Wears dentures   . Wears glasses     Past Surgical History:  Procedure Laterality Date  . BACK SURGERY    . CATARACT EXTRACTION     right eye 06/20/15 left eye 09/12/15  . COLECTOMY  2009  . COLONOSCOPY  09/06/2007   Dr Carol Ada  . DILATION AND CURETTAGE OF UTERUS    . ENDOSCOPIC RETROGRADE CHOLANGIOPANCREATOGRAPHY (ERCP) WITH PROPOFOL N/A 03/17/2019   Procedure: ENDOSCOPIC RETROGRADE  CHOLANGIOPANCREATOGRAPHY (ERCP) WITH PROPOFOL;  Surgeon: Ladene Artist, MD;  Location: [facility] ENDOSCOPY;  Service: Endoscopy;  Laterality: N/A;  . JOINT REPLACEMENT     right  . LUMBAR LAMINECTOMY/DECOMPRESSION MICRODISCECTOMY N/A 03/30/2013   Procedure: CENTRAL DECOMPRESSION L4 - L5 AND EXCISION 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;  Surgeon: Tobi Bastos, MD;  Location: [facility];  Service: Orthopedics;  Laterality: N/A;  . MULTIPLE TOOTH EXTRACTIONS    . REMOVAL OF STONES  03/17/2019   Procedure: REMOVAL OF STONES;  Surgeon: Ladene Artist, MD;  Location: [facility] ENDOSCOPY;  Service: Endoscopy;;  . SIGMOIDOSCOPY  02/16/2018   Dr Fuller Plan  . SPHINCTEROTOMY  03/17/2019   Procedure: SPHINCTEROTOMY;  Surgeon: Ladene Artist, MD;  Location: [facility];  Service: Endoscopy;;  . TOTAL KNEE ARTHROPLASTY Right 02/01/2013   Procedure: RIGHT TOTAL KNEE ARTHROPLASTY;  Surgeon: Tobi Bastos, MD;  Location: [facility];  Service: Orthopedics;  Laterality: Right;  . TOTAL KNEE ARTHROPLASTY Left 04/18/2014   Procedure: LEFT TOTAL KNEE ARTHROPLASTY;  Surgeon: Latanya Maudlin, MD;  Location: [facility];  Service: Orthopedics;  Laterality: Left;    There were no vitals filed for this visit.   Subjective Assessment - 06/29/19 0935    Subjective No new complaints. No falls. Has not done the new lying down ex's as yet, only the standing  ones that were left on her program (balance ex's).    Pertinent History anxiety, HTN, DM, HLD, diverticulitis, obesity, OA with bilat TKA, osteopenia, glaucoma, colectomy, lumbar laminectomy/decompression and cataract extraction    Patient Stated Goals Help the pain and help the balance    Currently in Pain? Yes    Pain Score 6     Pain Location Knee    Pain Orientation Right;Left    Pain Descriptors / Indicators Aching    Pain Type Chronic pain    Pain Onset More than a month ago    Pain Frequency Constant    Aggravating Factors  unknown, "maybe not taking anything for it"    Pain Relieving  Factors Tylenol, topical ointment, exercise               [facility] Adult PT Treatment/Exercise - 06/29/19 0938      Transfers   Transfers Sit to Stand;Stand to Sit    Sit to Stand 5: Supervision;With upper extremity assist    Stand to Sit 5: Supervision;With upper extremity assist      Ambulation/Gait   Ambulation/Gait Yes    Ambulation/Gait Assistance 5: Supervision    Ambulation/Gait Assistance Details around gym with session    Assistive device Straight cane    Gait Pattern Step-through pattern;Trendelenburg;Antalgic;Poor foot clearance - left;Poor foot clearance - right    Ambulation Surface Level;Indoor      Knee/Hip Exercises: Aerobic   Nustep with UE/LE's on level 7 for 3 mintues, then decr to level 6 for 3 additonal minutes, all with 45-50 steps per minute goal for strengthening and activity tolerance.       Knee/Hip Exercises: Standing   Heel Raises Both;1 set;10 reps;Limitations    Heel Raises Limitations on airex with light bil UE support on bars, cues for form/technique    Hip Flexion AROM;Stengthening;Both;1 set;10 reps;Knee bent    Hip Flexion Limitations on airex with light UE support- alternating mid high marching for 10 reps each side.     Other Standing Knee Exercises standing on airex with feet hip width apart: 3 way kicks, alternating LE's for 8 reps each side with light UE support on bars. min guard assist with cues on posture and ex form.                Balance Exercises - 06/29/19 1000      Balance Exercises: Standing   Standing Eyes Closed Wide (BOA);Narrow base of support (BOS);Foam/compliant surface;Head turns;Other reps (comment);30 secs;Limitations    Standing Eyes Closed Limitations on airex with no UE support, min guard to min assist for balance: wide<>narrow<>wide BOS for EC no head movements. pt wanting to try feet closer to see if was easier, incr sway noted with pt requesting to bring feet apart for 3rd rep;     Rockerboard  Anterior/posterior;Head turns;EO;30 seconds;10 reps;Limitations;EC    Rockerboard Limitations on rockerboard in ant/post direction: rocking the board with EO, progressing to EC. no UE support with EO, light fingertip support with EC; then holding the board steady: alternating UE raises, progressing to bil UE raises for 8-10 reps each, min guard to min assist for balance; then with no UE support with EO head movements left<>right, up<>down                PT Short Term Goals - 06/02/19 1032      PT SHORT TERM GOAL #1   Title Pt will demonstrate independence with ongoing HEP and walking program for aerobic conditioning  Time 4    Period Weeks    Status Achieved    Target Date 06/07/19      PT SHORT TERM GOAL #2   Title Pt will decrease time to perform five time sit to stand to </= 17 seconds    Baseline Five time sit to stand is now [facility]: 12 seconds    Status Achieved      PT SHORT TERM GOAL #3   Title Pt will increase BERG score to >/= 51/56 to indicate decreased falls risk    Baseline 51/56    Time 4    Period Weeks    Status Achieved    Target Date 06/07/19      PT SHORT TERM GOAL #4   Title Pt will improve DGI to >/= 20/24    Baseline 22/24    Time 4    Period Weeks    Status Achieved    Target Date 06/07/19      PT SHORT TERM GOAL #5   Title Pt will ambulate x 500' over paved outdoor surfaces, negotiate curb with cane and supervision    Time 4    Period Weeks    Status Achieved    Target Date 06/07/19             PT Long Term Goals - 05/09/19 1538      PT LONG TERM GOAL #1   Title Pt will demonstrate independence with final LE strength/balance HEP, biking program and community wellness plan    Time 8    Period Weeks    Status Revised    Target Date 07/07/19      PT LONG TERM GOAL #3   Title Pt will increase BERG to >/= 53/56 to indicate decreased falls risk    Time 8    Period Weeks    Status Revised    Target Date 07/07/19      PT LONG TERM GOAL  #4   Title Pt will increase DGI to >/= 22/24 with cane to indicate decreased risk for falls in community    Time 8    Period Weeks    Status Revised    Target Date 07/07/19      PT LONG TERM GOAL #5   Title Pt will negotiate ramp and 5 stairs with cane MOD I for safe home entry/exit    Baseline --    Time 8    Period Weeks    Status Revised    Target Date 07/07/19                 Plan - 06/29/19 1025    Clinical Impression Statement Today's skilled session continued to focus on LE strengthening and balance reactions with no issues reported. Rest breaks taken due to fatigue. The pt is progressing toward goals.    Personal Factors and Comorbidities Comorbidity 3+;Fitness;Social Background;Transportation    Comorbidities anxiety, HTN, DM, HLD, diverticulitis, obesity, OA with bilat TKA, osteopenia, glaucoma, colectomy, lumbar laminectomy/decompression and cataract extraction    Examination-Activity Limitations Caring for Others;Locomotion Level;Stairs;Stand    Examination-Participation Restrictions Cleaning;Community Activity;Laundry;Meal Prep    Rehab Potential Good    PT Frequency 1x / week    PT Duration 8 weeks    PT Treatment/Interventions ADLs/Self Care Home Management;Electrical Stimulation;Cryotherapy;Moist Heat;DME Instruction;Gait training;Stair training;Functional mobility training;Therapeutic activities;Therapeutic exercise;Balance training;Neuromuscular re-education;Patient/family education;Orthotic Fit/Training;Passive range of motion;Dry needling;Taping    PT Next Visit Plan LTG to be assessed 6/24 and send to me for D/C.  PT Home Exercise Plan Access Code: [email or web address]    Consulted and Agree with Plan of Care Patient           Patient will benefit from skilled therapeutic intervention in order to improve the following deficits and impairments:  Decreased balance, Decreased activity tolerance, Decreased strength, Difficulty walking, Impaired sensation,  Pain  Visit Diagnosis: Unsteadiness on feet  Other disturbances of skin sensation  Difficulty in walking, not elsewhere classified  Chronic pain of left knee  Chronic pain of right knee     Problem List Patient Active Problem List   Diagnosis Date Noted  . Calculus of bile duct without cholangitis with obstruction   . Renal calcification 03/10/2019  . Tobacco use disorder, continuous 03/10/2019  . Transaminitis 02/04/2019  . Abnormal ultrasound of liver 02/04/2019  . Sessile rectal polyp 05/02/2018  . Internal hemorrhoids without complication 89/16/9450  . Diverticulosis 05/02/2018  . 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 Chronic pain of both knees 08/19/2017  . Cataracts, bilateral 07/08/2015  . Hyperlipidemia associated with type 2 diabetes mellitus ([place]) 04/27/2015  . History of total knee arthroplasty 04/18/2014  . History of lumbar laminectomy for spinal cord decompression 05/04/2013  . Synovial cyst of lumbar spine 03/31/2013  . Spinal stenosis of lumbar region with neurogenic claudication 03/30/2013  . Synovial cyst of lumbar facet joint 03/30/2013  . Type 2 diabetes mellitus with neurological complications ([place]) 38/88/2800  . Osteoarthritis of right knee 02/01/2013  . History of total knee replacement 02/01/2013  . Tobacco abuse 10/03/2012  . Hyperglycemia 10/03/2012  . Unspecified vitamin D deficiency   . Pernicious anemia   . Glaucoma (increased eye pressure)   . Benign essential hypertension   . Osteopenia     Willow Ora, PTA, [facility] [address], [place], [place] [address] [phone] 06/30/19, 4:02 PM   Name: Darlen Gledhill MRN: [id number] Date of Birth: 23-Jun-1945

## 2019-07-06 ENCOUNTER — Encounter: Payer: Self-pay | Admitting: Physical Therapy

## 2019-07-06 ENCOUNTER — Ambulatory Visit: Payer: Medicare Other | Admitting: Physical Therapy

## 2019-07-06 ENCOUNTER — Other Ambulatory Visit: Payer: Self-pay

## 2019-07-06 VITALS — BP 157/96

## 2019-07-06 DIAGNOSIS — G8929 Other chronic pain: Secondary | ICD-10-CM

## 2019-07-06 DIAGNOSIS — M25561 Pain in right knee: Secondary | ICD-10-CM

## 2019-07-06 DIAGNOSIS — R2681 Unsteadiness on feet: Secondary | ICD-10-CM | POA: Diagnosis not present

## 2019-07-06 DIAGNOSIS — R262 Difficulty in walking, not elsewhere classified: Secondary | ICD-10-CM

## 2019-07-06 DIAGNOSIS — M25562 Pain in left knee: Secondary | ICD-10-CM

## 2019-07-06 DIAGNOSIS — R208 Other disturbances of skin sensation: Secondary | ICD-10-CM

## 2019-07-06 NOTE — Patient Instructions (Signed)
Access Code: [email or web address] URL: [email or web address] Date: 07/06/2019 Prepared by: Willow Ora  Exercises Heel Toe Raises with Counter Support - 1 x daily - 7 x weekly - 2 sets - 10 reps Standing Hip Extension with Resistance at Ankles and Counter Support - 1 x daily - 7 x weekly - 2 sets - 10 reps Standing Hip Abduction with Resistance at Ankles and Counter Support - 1 x daily - 5 x weekly - 1 sets - 10 reps Standing Hip Flexion with Resistance at Ankles and Counter Support - 1 x daily - 5 x weekly - 1 sets - 10 reps Tandem Stance with Support - 1 x daily - 7 x weekly - 2 sets - 10 second hold Standing Gastroc Stretch at Counter - 1 x daily - 5 x weekly - 1 sets - 3 reps - 30 hold Seated Figure 4 Piriformis Stretch - 1 x daily - 7 x weekly - 1 sets - 30 seconds hold - 2 reps

## 2019-07-06 NOTE — Therapy (Addendum)
[facility] [address] [place], [place], [address] Phone: [phone]   Fax:  [phone]  Physical Therapy Treatment and D/C Summary  Patient Details  Name: Rhetta Cleek MRN: [id number] Date of Birth: [birth year] Referring Provider (PT): Gayland Curry, DO   Encounter Date: 07/06/2019   PT End of Session - 07/06/19 0939    Visit Number 17    Number of Visits 17    Date for PT Re-Evaluation 07/07/19    Authorization Type Medicare Primary/GHI Secondary - 10th visit PN    Progress Note Due on Visit 20    PT Start Time 0932    PT Stop Time 1011    PT Time Calculation (min) 39 min    Equipment Utilized During Treatment Gait belt    Activity Tolerance Patient tolerated treatment well    Behavior During Therapy [facility] for tasks assessed/performed           Past Medical History:  Diagnosis Date  . Anxiety   . Benign essential hypertension   . Blood transfusion without reported diagnosis   . Diabetes mellitus without complication ([place])   . Diverticulitis of colon (without mention of hemorrhage)(562.11)   . Goiter, specified as simple   . Hyperlipidemia LDL goal < 100   . Intrahepatic bile duct dilation   . Numbness    TOES RT FOOT and toes left foot  . Obesity, unspecified   . Osteoarthrosis, unspecified whether generalized or localized, lower leg   . Osteopenia   . Other specified disease of nail    FUNGUS OF FINGERNAILS  . Pernicious anemia   . Unspecified glaucoma(365.9)   . Unspecified vitamin D deficiency   . Wears dentures   . Wears glasses     Past Surgical History:  Procedure Laterality Date  . BACK SURGERY    . CATARACT EXTRACTION     right eye 06/20/15 left eye 09/12/15  . COLECTOMY  2009  . COLONOSCOPY  09/06/2007   Dr Carol Ada  . DILATION AND CURETTAGE OF UTERUS    . ENDOSCOPIC RETROGRADE CHOLANGIOPANCREATOGRAPHY (ERCP) WITH PROPOFOL N/A 03/17/2019   Procedure: ENDOSCOPIC RETROGRADE  CHOLANGIOPANCREATOGRAPHY (ERCP) WITH PROPOFOL;  Surgeon: Ladene Artist, MD;  Location: [facility] ENDOSCOPY;  Service: Endoscopy;  Laterality: N/A;  . JOINT REPLACEMENT     right  . LUMBAR LAMINECTOMY/DECOMPRESSION MICRODISCECTOMY N/A 03/30/2013   Procedure: CENTRAL DECOMPRESSION L4 - L5 AND EXCISION 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;  Surgeon: Tobi Bastos, MD;  Location: [facility] ORS;  Service: Orthopedics;  Laterality: N/A;  . MULTIPLE TOOTH EXTRACTIONS    . REMOVAL OF STONES  03/17/2019   Procedure: REMOVAL OF STONES;  Surgeon: Ladene Artist, MD;  Location: [facility] ENDOSCOPY;  Service: Endoscopy;;  . SIGMOIDOSCOPY  02/16/2018   Dr Fuller Plan  . SPHINCTEROTOMY  03/17/2019   Procedure: SPHINCTEROTOMY;  Surgeon: Ladene Artist, MD;  Location: [place];  Service: Endoscopy;;  . TOTAL KNEE ARTHROPLASTY Right 02/01/2013   Procedure: RIGHT TOTAL KNEE ARTHROPLASTY;  Surgeon: Tobi Bastos, MD;  Location: [facility];  Service: Orthopedics;  Laterality: Right;  . TOTAL KNEE ARTHROPLASTY Left 04/18/2014   Procedure: LEFT TOTAL KNEE ARTHROPLASTY;  Surgeon: Latanya Maudlin, MD;  Location: [facility];  Service: Orthopedics;  Laterality: Left;    Vitals:   07/06/19 0935  BP: (!) 157/96     Subjective Assessment - 07/06/19 0937    Subjective No new complaints. No falls. Still has not tried the new lying  down ex's added 2 sessions ago. Has been doing the others some    Pertinent History anxiety, HTN, DM, HLD, diverticulitis, obesity, OA with bilat TKA, osteopenia, glaucoma, colectomy, lumbar laminectomy/decompression and cataract extraction    Patient Stated Goals Help the pain and help the balance    Currently in Pain? Yes    Pain Score 8     Pain Location Knee    Pain Orientation Right;Left   left>right   Pain Descriptors / Indicators Aching    Pain Type Chronic pain    Pain Onset More than a month ago    Pain Frequency Constant    Aggravating Factors  unknown    Pain Relieving Factors tylenol, topical ointment,  exercise              [facility] PT Assessment - 07/06/19 0939      Standardized Balance Assessment   Standardized Balance Assessment Berg Balance Test;Dynamic Gait Index      Berg Balance Test   Sit to Stand Able to stand without using hands and stabilize independently    Standing Unsupported Able to stand safely 2 minutes    Sitting with Back Unsupported but Feet Supported on Floor or Stool Able to sit safely and securely 2 minutes    Stand to Sit Sits safely with minimal use of hands    Transfers Able to transfer safely, minor use of hands    Standing Unsupported with Eyes Closed Able to stand 10 seconds safely    Standing Unsupported with Feet Together Able to place feet together independently and stand 1 minute safely    From Standing, Reach Forward with Outstretched Arm Can reach confidently >25 cm (10")    From Standing Position, Pick up Object from Floor Able to pick up shoe safely and easily    From Standing Position, Turn to Look Behind Over each Shoulder Looks behind from both sides and weight shifts well    Turn 360 Degrees Able to turn 360 degrees safely one side only in 4 seconds or less   right only   Standing Unsupported, Alternately Place Feet on Step/Stool Able to stand independently and safely and complete 8 steps in 20 seconds    Standing Unsupported, One Foot in Front Able to place foot tandem independently and hold 30 seconds    Standing on One Leg Able to lift leg independently and hold equal to or more than 3 seconds   4.78 sec's   Total Score 53    Berg comment: 53/56 scored today- lower risk for falls      Dynamic Gait Index   Level Surface Normal    Change in Gait Speed Normal    Gait with Horizontal Head Turns Normal    Gait with Vertical Head Turns Normal    Gait and Pivot Turn Normal    Step Over Obstacle Normal    Step Around Obstacles Normal    Steps Mild Impairment    Total Score 23    DGI comment: 23/24                 [facility] Adult PT  Treatment/Exercise - 07/06/19 0948      Transfers   Transfers Sit to Stand;Stand to Sit    Sit to Stand 6: Modified independent (Device/Increase time);With upper extremity assist;From chair/3-in-1    Stand to Sit 6: Modified independent (Device/Increase time);With upper extremity assist;To chair/3-in-1      Ambulation/Gait   Ambulation/Gait Yes    Ambulation/Gait  Assistance 5: Supervision    Ambulation/Gait Assistance Details around gym with session    Assistive device Straight cane;None    Gait Pattern Step-through pattern;Trendelenburg;Antalgic;Poor foot clearance - left;Poor foot clearance - right    Ambulation Surface Level;Indoor    Stairs Yes    Stairs Assistance 6: Modified independent (Device/Increase time)    Stair Management Technique Two rails;Alternating pattern;Forwards    Number of Stairs 4    Height of Stairs 6    Ramp 6: Modified independent (Device)    Ramp Details (indicate cue type and reason) with cane      Self-Care   Self-Care Other Self-Care Comments    Other Self-Care Comments  reviewed HEP. Refer to [place] for adjustments/changes. cues on posutre and ex form.           Issued to HEP today:  Access Code: [email or web address] URL: [email or web address] Date: 07/06/2019 Prepared by: Willow Ora  Exercises Heel Toe Raises with Counter Support - 1 x daily - 7 x weekly - 2 sets - 10 reps Standing Hip Extension with Resistance at Ankles and Counter Support - 1 x daily - 7 x weekly - 2 sets - 10 reps Standing Hip Abduction with Resistance at Ankles and Counter Support - 1 x daily - 5 x weekly - 1 sets - 10 reps Standing Hip Flexion with Resistance at Ankles and Counter Support - 1 x daily - 5 x weekly - 1 sets - 10 reps Tandem Stance with Support - 1 x daily - 7 x weekly - 2 sets - 10 second hold Standing Gastroc Stretch at Counter - 1 x daily - 5 x weekly - 1 sets - 3 reps - 30 hold Seated Figure 4 Piriformis Stretch - 1 x daily - 7 x weekly - 1 sets -  30 seconds hold - 2 reps        PT Education - 07/06/19 1136    Education Details progress toward goals, updated HEP for discharge    Person(s) Educated Patient    Methods Explanation;Demonstration;Verbal cues;Handout    Comprehension Verbalized understanding;Returned demonstration            PT Short Term Goals - 06/02/19 1032      PT SHORT TERM GOAL #1   Title Pt will demonstrate independence with ongoing HEP and walking program for aerobic conditioning    Time 4    Period Weeks    Status Achieved    Target Date 06/07/19      PT SHORT TERM GOAL #2   Title Pt will decrease time to perform five time sit to stand to </= 17 seconds    Baseline Five time sit to stand is now [facility]: 12 seconds    Status Achieved      PT SHORT TERM GOAL #3   Title Pt will increase BERG score to >/= 51/56 to indicate decreased falls risk    Baseline 51/56    Time 4    Period Weeks    Status Achieved    Target Date 06/07/19      PT SHORT TERM GOAL #4   Title Pt will improve DGI to >/= 20/24    Baseline 22/24    Time 4    Period Weeks    Status Achieved    Target Date 06/07/19      PT SHORT TERM GOAL #5   Title Pt will ambulate x 500' over paved outdoor surfaces, negotiate curb with cane and supervision  Time 4    Period Weeks    Status Achieved    Target Date 06/07/19             PT Long Term Goals - 07/06/19 1138      PT LONG TERM GOAL #1   Title Pt will demonstrate independence with final LE strength/balance HEP, biking program and community wellness plan    Baseline 07/06/19: met with updated HEP this session.    Status Achieved      PT LONG TERM GOAL #3   Title Pt will increase BERG to >/= 53/56 to indicate decreased falls risk    Baseline 07/06/19: 53/56 scored today    Status Achieved      PT LONG TERM GOAL #4   Title Pt will increase DGI to >/= 22/24 with cane to indicate decreased risk for falls in community    Baseline 07/06/19: 23/24 scored today    Time 8     Status Achieved      PT LONG TERM GOAL #5   Title Pt will negotiate ramp and 5 stairs with cane MOD I for safe home entry/exit    Baseline 07/06/19: met in session today    Status Achieved                 Plan - 07/06/19 0939    Clinical Impression Statement Today's skilled session focused on progress toward LTGs with all goals met. Pt increased her Berg Balance Test score to 53/56 and her Dynamic Gait Index score to 23/24. Pt's HEP advanced today as indicated with no issues reported or noted. The pt is in agreement with discharge today.    Personal Factors and Comorbidities Comorbidity 3+;Fitness;Social Background;Transportation    Comorbidities anxiety, HTN, DM, HLD, diverticulitis, obesity, OA with bilat TKA, osteopenia, glaucoma, colectomy, lumbar laminectomy/decompression and cataract extraction    Examination-Activity Limitations Caring for Others;Locomotion Level;Stairs;Stand    Examination-Participation Restrictions Cleaning;Community Activity;Laundry;Meal Prep    Rehab Potential Good    PT Frequency 1x / week    PT Duration 8 weeks    PT Treatment/Interventions ADLs/Self Care Home Management;Electrical Stimulation;Cryotherapy;Moist Heat;DME Instruction;Gait training;Stair training;Functional mobility training;Therapeutic activities;Therapeutic exercise;Balance training;Neuromuscular re-education;Patient/family education;Orthotic Fit/Training;Passive range of motion;Dry needling;Taping    PT Next Visit Plan discharge today.    PT Home Exercise Plan Access Code: [email or web address]    Consulted and Agree with Plan of Care Patient           Patient will benefit from skilled therapeutic intervention in order to improve the following deficits and impairments:  Decreased balance, Decreased activity tolerance, Decreased strength, Difficulty walking, Impaired sensation, Pain  Visit Diagnosis: Unsteadiness on feet  Other disturbances of skin sensation  Difficulty in walking, not  elsewhere classified  Chronic pain of left knee  Chronic pain of right knee     Problem List Patient Active Problem List   Diagnosis Date Noted  . Calculus of bile duct without cholangitis with obstruction   . Renal calcification 03/10/2019  . Tobacco use disorder, continuous 03/10/2019  . Transaminitis 02/04/2019  . Abnormal ultrasound of liver 02/04/2019  . Sessile rectal polyp 05/02/2018  . Internal hemorrhoids without complication 69/48/5462  . Diverticulosis 05/02/2018  . 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 Chronic pain of both knees 08/19/2017  . Cataracts, bilateral 07/08/2015  . Hyperlipidemia associated with type 2 diabetes mellitus ([place]) 04/27/2015  . History of total knee arthroplasty 04/18/2014  . History of lumbar laminectomy for spinal cord decompression 05/04/2013  .  Synovial cyst of lumbar spine 03/31/2013  . Spinal stenosis of lumbar region with neurogenic claudication 03/30/2013  . Synovial cyst of lumbar facet joint 03/30/2013  . Type 2 diabetes mellitus with neurological complications ([place]) 76/16/0737  . Osteoarthritis of right knee 02/01/2013  . History of total knee replacement 02/01/2013  . Tobacco abuse 10/03/2012  . Hyperglycemia 10/03/2012  . Unspecified vitamin D deficiency   . Pernicious anemia   . Glaucoma (increased eye pressure)   . Benign essential hypertension   . Osteopenia     Willow Ora, PTA, [facility] [address] [place], [place] [address] [phone] 07/06/19, 11:47 AM    PHYSICAL THERAPY DISCHARGE SUMMARY  Visits from Start of Care: 17  Current functional level related to goals / functional outcomes: Pt has made good progress and has met all LTG.  Pt is safe for D/C today; see LTG achievement above.   Remaining deficits: Mild balance deficits   Education / Equipment: HEP  Plan: Patient agrees to discharge.  Patient goals were met. Patient is being discharged  due to meeting the stated rehab goals.  ?????     Rico Junker, PT, DPT 07/06/19    5:31 PM     Name: Dell Hurtubise MRN: [id number] Date of Birth: Feb 26, 1945

## 2019-07-07 ENCOUNTER — Ambulatory Visit (INDEPENDENT_AMBULATORY_CARE_PROVIDER_SITE_OTHER): Payer: Medicare Other | Admitting: Podiatry

## 2019-07-07 DIAGNOSIS — B351 Tinea unguium: Secondary | ICD-10-CM

## 2019-07-07 DIAGNOSIS — M79674 Pain in right toe(s): Secondary | ICD-10-CM | POA: Diagnosis not present

## 2019-07-07 DIAGNOSIS — M79675 Pain in left toe(s): Secondary | ICD-10-CM | POA: Diagnosis not present

## 2019-07-07 DIAGNOSIS — E1149 Type 2 diabetes mellitus with other diabetic neurological complication: Secondary | ICD-10-CM

## 2019-07-08 ENCOUNTER — Encounter: Payer: Self-pay | Admitting: Podiatry

## 2019-07-08 NOTE — Progress Notes (Signed)
  Subjective:  Patient ID: Alison [name], female    DOB: [birth year],  MRN: [id number]  Chief Complaint  Patient presents with  . Nail Problem    Nail trim 1-5 bilateral  . Peripheral Neuropathy    Pt states gabapentin does help but she thinks her dose may be too low.  . Diabetes Mellitus    Diabetic foot exam   74 y.o. female returns for the above complaint. Patient presents with thickened elongated dystrophic toenails x10. Patient is a diabetic with last A1c of 5.9. Patient states that she does not have diabetic shoes however she does not want to get any diabetic shoes either. She states that the nails have been getting really long she has not been able to debride on herself. She would like to know if we can debride down for her. She states today with her neuropathy gabapentin tends to help.  Objective:  There were no vitals filed for this visit. Podiatric Exam: Vascular: dorsalis pedis and posterior tibial pulses are palpable bilateral. Capillary return is immediate. Temperature gradient is WNL. Skin turgor WNL  Sensorium: Decreased Semmes Weinstein monofilament test. Decreased tactile sensation bilaterally. Nail Exam: Pt has thick disfigured discolored nails with subungual debris noted bilateral entire nail hallux through fifth toenails.  Pain on palpation to the nails. Ulcer Exam: There is no evidence of ulcer or pre-ulcerative changes or infection. Orthopedic Exam: Muscle tone and strength are WNL. No limitations in general ROM. No crepitus or effusions noted. HAV  B/L.  Hammer toes 2-5  B/L. Skin: No Porokeratosis. No infection or ulcers    Assessment & Plan:   1. Pain due to onychomycosis of toenails of both feet   2. Type 2 diabetes mellitus with neurological complications [facility])     Patient was evaluated and treated and all questions answered.  Onychomycosis with pain  -Nails palliatively debrided as below. -Educated on self-care  Procedure: Nail Debridement Rationale:  pain  Type of Debridement: manual, sharp debridement. Instrumentation: Nail nipper, rotary burr. Number of Nails: 10  Procedures and Treatment: Consent by patient was obtained for treatment procedures. The patient understood the discussion of treatment and procedures well. All questions were answered thoroughly reviewed. Debridement of mycotic and hypertrophic toenails, 1 through 5 bilateral and clearing of subungual debris. No ulceration, no infection noted.  Return Visit-Office Procedure: Patient instructed to return to the office for a follow up visit 3 months for continued evaluation and treatment.  Boneta Lucks, DPM    Return in about 3 months (around 10/07/2019) for routine care with Dr. Prudence Davidson .

## 2019-08-01 DIAGNOSIS — Z1231 Encounter for screening mammogram for malignant neoplasm of breast: Secondary | ICD-10-CM | POA: Diagnosis not present

## 2019-08-01 LAB — HM MAMMOGRAPHY

## 2019-08-18 ENCOUNTER — Ambulatory Visit (INDEPENDENT_AMBULATORY_CARE_PROVIDER_SITE_OTHER): Payer: Medicare Other | Admitting: Gastroenterology

## 2019-08-18 ENCOUNTER — Encounter: Payer: Self-pay | Admitting: Gastroenterology

## 2019-08-18 VITALS — BP 138/84 | HR 64 | Ht 66.0 in | Wt 214.2 lb

## 2019-08-18 DIAGNOSIS — R1013 Epigastric pain: Secondary | ICD-10-CM

## 2019-08-18 MED ORDER — PANTOPRAZOLE SODIUM 40 MG PO TBEC
40.0000 mg | DELAYED_RELEASE_TABLET | Freq: Every day | ORAL | 11 refills | Status: DC
Start: 2019-08-18 — End: 2020-07-24

## 2019-08-18 NOTE — Patient Instructions (Signed)
We have sent the following medications to your pharmacy for you to pick up at your convenience: pantoprazole 40 mg daily.   You have been scheduled for an endoscopy. Please follow written instructions given to you at your visit today. If you use inhalers (even only as needed), please bring them with you on the day of your procedure.  Thank you for choosing me and [facility].  Pricilla Riffle. Dagoberto Ligas., MD., [facility]

## 2019-08-18 NOTE — Progress Notes (Signed)
    History of Present Illness: This is a 73-year-old female complaining of epigastric discomfort and nausea.  Her symptoms are intermittent and often brought on by meals.  She has a history of cholelithiasis and choledocholithiasis.  She underwent ERCP with sphincterotomy, common bile duct stone extraction in March 2021.  She has persistent elevations of transaminases and alkaline phosphatase.  Statin drugs were held temporarily without a significant change.  Current Medications, Allergies, Past Medical History, Past Surgical History, Family History and Social History were reviewed in [facility] electronic medical record.   Physical Exam: General: Well developed, well nourished, no acute distress Head: Normocephalic and atraumatic Eyes:  sclerae anicteric, EOMI Ears: Normal auditory acuity Mouth: Not examined, mask on during Covid-19 pandemic Lungs: Clear throughout to auscultation Heart: Regular rate and rhythm; no murmurs, rubs or bruits Abdomen: Soft, non tender and non distended. No masses, hepatosplenomegaly or hernias noted. Normal Bowel sounds Rectal: Not done Musculoskeletal: Symmetrical with no gross deformities  Pulses:  Normal pulses noted Extremities: No clubbing, cyanosis, edema or deformities noted Neurological: Alert oriented x 4, grossly nonfocal Psychological:  Alert and cooperative. Normal mood and affect   Assessment and Recommendations:  1. Epigastric pain, nausea, elevated AST, ALT and alk phos.  Suspected symptomatic cholelithiasis and possible recurrent choledocholithiasis.  Rule out ulcer, gastritis, GERD.  Begin pantoprazole 40 mg daily for 2 months.  Schedule EGD. The risks (including bleeding, perforation, infection, missed lesions, medication reactions and possible hospitalization or surgery if complications occur), benefits, and alternatives to endoscopy with possible biopsy and possible dilation were discussed with the patient and they consent to proceed.  If no significant findings on EGD and symptoms persist proceed with surgical referral for consideration of cholecystectomy with IOC.  2.  Elevated LFTs.  No intrinsic liver disease noted on ultrasound, MRI and CT performed earlier this year.  If LFT abnormalities persist and a biliary etiology is not found will plan for further evaluation with standard hepatic serologies.  

## 2019-08-22 ENCOUNTER — Other Ambulatory Visit: Payer: Medicare Other

## 2019-08-24 ENCOUNTER — Ambulatory Visit: Payer: Medicare Other | Admitting: Internal Medicine

## 2019-08-31 ENCOUNTER — Other Ambulatory Visit: Payer: Self-pay | Admitting: Internal Medicine

## 2019-08-31 ENCOUNTER — Encounter: Payer: Medicare Other | Admitting: Gastroenterology

## 2019-08-31 DIAGNOSIS — E1142 Type 2 diabetes mellitus with diabetic polyneuropathy: Secondary | ICD-10-CM

## 2019-09-04 ENCOUNTER — Other Ambulatory Visit: Payer: Medicare Other

## 2019-09-04 ENCOUNTER — Other Ambulatory Visit: Payer: Self-pay

## 2019-09-04 DIAGNOSIS — D510 Vitamin B12 deficiency anemia due to intrinsic factor deficiency: Secondary | ICD-10-CM | POA: Diagnosis not present

## 2019-09-04 DIAGNOSIS — I10 Essential (primary) hypertension: Secondary | ICD-10-CM | POA: Diagnosis not present

## 2019-09-04 DIAGNOSIS — E785 Hyperlipidemia, unspecified: Secondary | ICD-10-CM

## 2019-09-04 DIAGNOSIS — E1169 Type 2 diabetes mellitus with other specified complication: Secondary | ICD-10-CM | POA: Diagnosis not present

## 2019-09-04 DIAGNOSIS — E1149 Type 2 diabetes mellitus with other diabetic neurological complication: Secondary | ICD-10-CM

## 2019-09-04 DIAGNOSIS — K769 Liver disease, unspecified: Secondary | ICD-10-CM

## 2019-09-04 DIAGNOSIS — K5790 Diverticulosis of intestine, part unspecified, without perforation or abscess without bleeding: Secondary | ICD-10-CM

## 2019-09-05 LAB — CBC WITH DIFFERENTIAL/PLATELET
Absolute Monocytes: 724 cells/uL (ref 200–950)
Basophils Absolute: 56 cells/uL (ref 0–200)
Basophils Relative: 0.6 %
Eosinophils Absolute: 235 cells/uL (ref 15–500)
Eosinophils Relative: 2.5 %
HCT: 38.4 % (ref 35.0–45.0)
Hemoglobin: 12.4 g/dL (ref 11.7–15.5)
Lymphs Abs: 2303 cells/uL (ref 850–3900)
MCH: 28.6 pg (ref 27.0–33.0)
MCHC: 32.3 g/dL (ref 32.0–36.0)
MCV: 88.5 fL (ref 80.0–100.0)
MPV: 10.5 fL (ref 7.5–12.5)
Monocytes Relative: 7.7 %
Neutro Abs: 6082 cells/uL (ref 1500–7800)
Neutrophils Relative %: 64.7 %
Platelets: 342 10*3/uL (ref 140–400)
RBC: 4.34 10*6/uL (ref 3.80–5.10)
RDW: 12.5 % (ref 11.0–15.0)
Total Lymphocyte: 24.5 %
WBC: 9.4 10*3/uL (ref 3.8–10.8)

## 2019-09-05 LAB — BASIC METABOLIC PANEL WITH GFR
BUN: 9 mg/dL (ref 7–25)
CO2: 27 mmol/L (ref 20–32)
Calcium: 9.5 mg/dL (ref 8.6–10.4)
Chloride: 105 mmol/L (ref 98–110)
Creat: 0.75 mg/dL (ref 0.60–0.93)
GFR, Est African American: 91 mL/min/{1.73_m2} (ref >=60)
GFR, Est Non African American: 79 mL/min/{1.73_m2} (ref >=60)
Glucose, Bld: 100 mg/dL — ABNORMAL HIGH (ref 65–99)
Potassium: 4.4 mmol/L (ref 3.5–5.3)
Sodium: 137 mmol/L (ref 135–146)

## 2019-09-05 LAB — HEPATIC FUNCTION PANEL
AG Ratio: 1.1 (calc) (ref 1.0–2.5)
ALT: 50 U/L — ABNORMAL HIGH (ref 6–29)
AST: 46 U/L — ABNORMAL HIGH (ref 10–35)
Albumin: 4.2 g/dL (ref 3.6–5.1)
Alkaline phosphatase (APISO): 159 U/L — ABNORMAL HIGH (ref 37–153)
Bilirubin, Direct: 0.1 mg/dL (ref 0.0–0.2)
Globulin: 3.9 g/dL (calc) — ABNORMAL HIGH (ref 1.9–3.7)
Indirect Bilirubin: 0.3 mg/dL (calc) (ref 0.2–1.2)
Total Bilirubin: 0.4 mg/dL (ref 0.2–1.2)
Total Protein: 8.1 g/dL (ref 6.1–8.1)

## 2019-09-05 LAB — LIPID PANEL
Cholesterol: 123 mg/dL (ref <200)
HDL: 45 mg/dL — ABNORMAL LOW (ref >=50)
LDL Cholesterol (Calc): 61 mg/dL (calc)
Non-HDL Cholesterol (Calc): 78 mg/dL (calc) (ref <130)
Total CHOL/HDL Ratio: 2.7 (calc) (ref <5.0)
Triglycerides: 83 mg/dL (ref <150)

## 2019-09-05 LAB — TSH: TSH: 0.48 mIU/L (ref 0.40–4.50)

## 2019-09-05 LAB — HEMOGLOBIN A1C
Hgb A1c MFr Bld: 6 % of total Hgb — ABNORMAL HIGH (ref <5.7)
Mean Plasma Glucose: 126 (calc)
eAG (mmol/L): 7 (calc)

## 2019-09-06 NOTE — Progress Notes (Signed)
Sugar average has trended back up a little but remains in prediabetic range. Thyroid is in normal range. Cholesterol is at goal with the medications Liver tests are elevated, but less than the past two times. Electrolytes and kidneys are normal. Blood counts are normal. No med changes are needed based on this but we'll discuss at her visit.

## 2019-09-07 ENCOUNTER — Ambulatory Visit (INDEPENDENT_AMBULATORY_CARE_PROVIDER_SITE_OTHER): Payer: Medicare Other | Admitting: Internal Medicine

## 2019-09-07 ENCOUNTER — Other Ambulatory Visit: Payer: Self-pay

## 2019-09-07 ENCOUNTER — Encounter: Payer: Self-pay | Admitting: Internal Medicine

## 2019-09-07 VITALS — BP 138/82 | HR 67 | Temp 97.2°F | Ht 66.0 in | Wt 214.0 lb

## 2019-09-07 DIAGNOSIS — F17201 Nicotine dependence, unspecified, in remission: Secondary | ICD-10-CM

## 2019-09-07 DIAGNOSIS — G8929 Other chronic pain: Secondary | ICD-10-CM

## 2019-09-07 DIAGNOSIS — M25562 Pain in left knee: Secondary | ICD-10-CM | POA: Diagnosis not present

## 2019-09-07 DIAGNOSIS — E785 Hyperlipidemia, unspecified: Secondary | ICD-10-CM | POA: Diagnosis not present

## 2019-09-07 DIAGNOSIS — E1169 Type 2 diabetes mellitus with other specified complication: Secondary | ICD-10-CM | POA: Diagnosis not present

## 2019-09-07 DIAGNOSIS — I10 Essential (primary) hypertension: Secondary | ICD-10-CM

## 2019-09-07 DIAGNOSIS — R7401 Elevation of levels of liver transaminase levels: Secondary | ICD-10-CM

## 2019-09-07 DIAGNOSIS — M5442 Lumbago with sciatica, left side: Secondary | ICD-10-CM | POA: Diagnosis not present

## 2019-09-07 DIAGNOSIS — M25561 Pain in right knee: Secondary | ICD-10-CM

## 2019-09-07 DIAGNOSIS — E1149 Type 2 diabetes mellitus with other diabetic neurological complication: Secondary | ICD-10-CM | POA: Diagnosis not present

## 2019-09-07 NOTE — Progress Notes (Signed)
Location:  [facility] clinic Provider:  Latana Colin L. Mariea Clonts, D.O., C.M.D.  Goals of Care:  Advanced Directives 09/07/2019  Does Patient Have a Medical Advance Directive? No  Type of Advance Directive -  Does patient want to make changes to medical advance directive? -  Would patient like information on creating a medical advance directive? No - Patient declined  Pre-existing out of facility DNR order (yellow form or pink MOST form) -     Chief Complaint  Patient presents with  . Medical Management of Chronic Issues    3 month follow up  . Health Maintenance    influenza high dose not in stock     HPI: Patient is a 74 y.o. female seen today for medical management of chronic diseases.    There are plans for other liver tests with Dr. Fuller Plan at her follow-up if her results do not improve.  They are better than they were but remain in abnormal range.   Hepatic Function Latest Ref Rng & Units 09/04/2019 05/29/2019 05/17/2019  Total Protein 6.1 - 8.1 g/dL 8.1 8.2(H) 8.0  Albumin 3.5 - 5.2 g/dL - - -  AST 10 - 35 U/L 46(H) 52(H) 96(H)  ALT 6 - 29 U/L 50(H) 77(H) 104(H)  Alk Phosphatase 39 - 117 U/L - - -  Total Bilirubin 0.2 - 1.2 mg/dL 0.4 0.5 0.5  Bilirubin, Direct 0.0 - 0.2 mg/dL 0.1 0.1 0.1   She is going to have an endoscopy to r/o ulcers.  Cholesterol is at goal.  Sugar average went up a little with weight, but not worrisome at 6.  She does already eat less candy.  She's drinking coconut water, regular water.  She's been baking goodies like cinnamon rolls, apple turnovers.    Arthritic pains remain problematic and limit her activity.  She continues to avoid smoking.   Past Medical History:  Diagnosis Date  . Anxiety   . Benign essential hypertension   . Blood transfusion without reported diagnosis   . Diabetes mellitus without complication ([place])   . Diverticulitis of colon (without mention of hemorrhage)(562.11)   . Goiter, specified as simple   . Hyperlipidemia LDL goal < 100     . Intrahepatic bile duct dilation   . Numbness    TOES RT FOOT and toes left foot  . Obesity, unspecified   . Osteoarthrosis, unspecified whether generalized or localized, lower leg   . Osteopenia   . Other specified disease of nail    FUNGUS OF FINGERNAILS  . Pernicious anemia   . Unspecified glaucoma(365.9)   . Unspecified vitamin D deficiency   . Wears dentures   . Wears glasses     Past Surgical History:  Procedure Laterality Date  . BACK SURGERY    . CATARACT EXTRACTION     right eye 06/20/15 left eye 09/12/15  . COLECTOMY  2009  . COLONOSCOPY  09/06/2007   Dr Carol Ada  . DILATION AND CURETTAGE OF UTERUS    . ENDOSCOPIC RETROGRADE CHOLANGIOPANCREATOGRAPHY (ERCP) WITH PROPOFOL N/A 03/17/2019   Procedure: ENDOSCOPIC RETROGRADE CHOLANGIOPANCREATOGRAPHY (ERCP) WITH PROPOFOL;  Surgeon: Ladene Artist, MD;  Location: [facility] ENDOSCOPY;  Service: Endoscopy;  Laterality: N/A;  . JOINT REPLACEMENT     right  . LUMBAR LAMINECTOMY/DECOMPRESSION MICRODISCECTOMY N/A 03/30/2013   Procedure: CENTRAL DECOMPRESSION L4 - L5 AND EXCISION 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;  Surgeon: Tobi Bastos, MD;  Location: [facility] ORS;  Service: Orthopedics;  Laterality: N/A;  . MULTIPLE TOOTH EXTRACTIONS    .  REMOVAL OF STONES  03/17/2019   Procedure: REMOVAL OF STONES;  Surgeon: Ladene Artist, MD;  Location: [facility] ENDOSCOPY;  Service: Endoscopy;;  . SIGMOIDOSCOPY  02/16/2018   Dr Fuller Plan  . SPHINCTEROTOMY  03/17/2019   Procedure: SPHINCTEROTOMY;  Surgeon: Ladene Artist, MD;  Location: [facility];  Service: Endoscopy;;  . TOTAL KNEE ARTHROPLASTY Right 02/01/2013   Procedure: RIGHT TOTAL KNEE ARTHROPLASTY;  Surgeon: Tobi Bastos, MD;  Location: [facility] ORS;  Service: Orthopedics;  Laterality: Right;  . TOTAL KNEE ARTHROPLASTY Left 04/18/2014   Procedure: LEFT TOTAL KNEE ARTHROPLASTY;  Surgeon: Latanya Maudlin, MD;  Location: [facility] ORS;  Service: Orthopedics;  Laterality: Left;    Allergies  Allergen Reactions  .  Aspirin Nausea Only    Can take 81 mg but can't take $RemoveBe'325mg'JdUyhWMBm$ , hard on stomach.    Outpatient Encounter Medications as of 09/07/2019  Medication Sig  . acetaminophen (TYLENOL) 650 MG CR tablet Take 1,300 mg by mouth in the morning and at bedtime.  Marland Kitchen aspirin EC 81 MG tablet Take 81 mg by mouth daily.  Marland Kitchen atorvastatin (LIPITOR) 40 MG tablet TAKE 1 TABLET BY MOUTH ONCE DAILY AT  6PM  . Calcium Carb-Cholecalciferol (CALCIUM 600 + D PO) Take 1 tablet by mouth daily.  . carboxymethylcellulose (REFRESH PLUS) 0.5 % SOLN Place 1 drop into both eyes 3 (three) times daily as needed (dry eyes).   . Cholecalciferol (VITAMIN D-3) 1000 UNITS CAPS Take 1,000 Units by mouth daily.   . COMBIGAN 0.2-0.5 % ophthalmic solution Place 1 drop into both eyes in the morning and at bedtime.   . gabapentin (NEURONTIN) 300 MG capsule Take 1 capsule (300 mg total) by mouth at bedtime.  . Homeopathic Products (THERAWORX RELIEF EX) Apply 1 application topically daily as needed (pain).  Marland Kitchen latanoprost (XALATAN) 0.005 % ophthalmic solution Place 1 drop into both eyes at bedtime.  Marland Kitchen lisinopril (ZESTRIL) 10 MG tablet Take 1 tablet (10 mg total) by mouth daily.  . meloxicam (MOBIC) 15 MG tablet Take 15 mg by mouth daily.   . Menthol, Topical Analgesic, (ICY HOT EX) Apply 1 application topically daily as needed (pain).  . metFORMIN (GLUCOPHAGE) 500 MG tablet TAKE 1/2 (ONE-HALF) TABLET BY MOUTH IN THE EVENING WITH SUPPER  . Multiple Vitamin (MULTIVITAMIN) tablet Take 1 tablet by mouth daily.  . Omega-3 Fatty Acids (OMEGA 3 PO) Take 1,200 mg by mouth daily.  . pantoprazole (PROTONIX) 40 MG tablet Take 1 tablet (40 mg total) by mouth daily.  Marland Kitchen trolamine salicylate (ASPERCREME) 10 % cream Apply 1 application topically as needed for muscle pain.   No facility-administered encounter medications on file as of 09/07/2019.    Review of Systems:  Review of Systems  Constitutional: Negative for chills and fever.  HENT: Negative for  congestion and sore throat.   Eyes: Negative for blurred vision.  Respiratory: Negative for cough and shortness of breath.   Cardiovascular: Negative for chest pain, palpitations and leg swelling.  Gastrointestinal: Positive for heartburn. Negative for abdominal pain, blood in stool, constipation, diarrhea and melena.       Improved since taking PPI  Genitourinary: Negative for dysuria.  Musculoskeletal: Positive for back pain and joint pain. Negative for falls.  Skin: Negative for rash.       Severe fungus of nails  Neurological: Negative for dizziness and loss of consciousness.       Walks with cane  Psychiatric/Behavioral: Negative for depression. The patient is not nervous/anxious and does not have  insomnia.        Memory not as sharp as it had been--gets things mixed up about her medical history, meds at times    Health Maintenance  Topic Date Due  . FOOT EXAM  12/28/2017  . INFLUENZA VACCINE  08/13/2019  . HEMOGLOBIN A1C  03/06/2020  . OPHTHALMOLOGY EXAM  05/15/2020  . MAMMOGRAM  07/31/2021  . COLONOSCOPY  02/17/2023  . TETANUS/TDAP  10/29/2023  . DEXA SCAN  Completed  . COVID-19 Vaccine  Completed  . Hepatitis C Screening  Completed  . PNA vac Low Risk Adult  Completed    Physical Exam: Vitals:   09/07/19 1011  BP: 138/82  Pulse: 67  Temp: (!) 97.2 F (36.2 C)  TempSrc: Temporal  SpO2: 98%  Weight: 214 lb (97.1 kg)  Height: $Remove'5\' 6"'hVBrmso$  (1.676 m)   Body mass index is 34.54 kg/m. Physical Exam Vitals reviewed.  Constitutional:      General: She is not in acute distress.    Appearance: Normal appearance. She is not toxic-appearing.  HENT:     Head: Normocephalic and atraumatic.  Eyes:     Comments: glasses  Cardiovascular:     Rate and Rhythm: Normal rate and regular rhythm.     Heart sounds: No murmur heard.   Pulmonary:     Effort: Pulmonary effort is normal.     Breath sounds: Normal breath sounds. No wheezing, rhonchi or rales.  Abdominal:     General:  Bowel sounds are normal.     Palpations: Abdomen is soft.  Musculoskeletal:        General: Normal range of motion.     Cervical back: Neck supple.     Right lower leg: No edema.     Left lower leg: No edema.  Skin:    Comments: Thick black fungal nails of hands  Neurological:     General: No focal deficit present.     Mental Status: She is alert and oriented to person, place, and time.     Cranial Nerves: No cranial nerve deficit.     Gait: Gait abnormal.     Comments: Uses cane  Psychiatric:        Mood and Affect: Mood normal.        Behavior: Behavior normal.     Labs reviewed: Basic Metabolic Panel: Recent Labs    03/14/19 1159 05/29/19 0924 09/04/19 1019  NA 136 135 137  K 4.1 4.5 4.4  CL 101 101 105  CO2 $Re'30 29 27  'ton$ GLUCOSE 105* 109* 100*  BUN $Re'11 12 9  'elf$ CREATININE 0.83 0.87 0.75  CALCIUM 10.0 9.9 9.5  TSH  --  0.53 0.48   Liver Function Tests: Recent Labs    03/14/19 1159 03/14/19 1159 05/17/19 0924 05/29/19 0924 09/04/19 1019  AST 23   < > 96* 52* 46*  ALT 21   < > 104* 77* 50*  ALKPHOS 77  --   --   --   --   BILITOT 0.4   < > 0.5 0.5 0.4  PROT 8.2   < > 8.0 8.2* 8.1  ALBUMIN 4.2  --   --   --   --    < > = values in this interval not displayed.   No results for input(s): LIPASE, AMYLASE in the last 8760 hours. No results for input(s): AMMONIA in the last 8760 hours. CBC: Recent Labs    03/14/19 1159 05/29/19 0924 09/04/19 1019  WBC 9.7 10.7  9.4  NEUTROABS 5.9 7,340 6,082  HGB 13.0 12.3 12.4  HCT 39.4 38.1 38.4  MCV 88.1 88.2 88.5  PLT 324.0 417* 342   Lipid Panel: Recent Labs    05/17/19 0924 05/29/19 0924 09/04/19 1019  CHOL 122 159 123  HDL 41* 40* 45*  LDLCALC 65 102* 61  TRIG 79 84 83  CHOLHDL 3.0 4.0 2.7   Lab Results  Component Value Date   HGBA1C 6.0 (H) 09/04/2019    Assessment/Plan 1. Type 2 diabetes mellitus with neurological complications (HCC) -well-controlled, but trended up (had a period of holding metformin  for liver and has been eating cinnamon rolls) -cont metformin $RemoveBeforeD'250mg'VhGybSedjfOGXO$  with evening meal -cont gabapentin for neuropathy -cont ace for renal protection, baby asa, statin   2. Hyperlipidemia associated with type 2 diabetes mellitus ([place]) -at goal with lipitor $RemoveBefo'40mg'xAGkRJeTMIj$  daily Lab Results  Component Value Date   LDLCALC 61 09/04/2019    3. Transaminitis -remains, but has trended down; not entirely clear why she has this and imaging has not revealed clear souree either -has had several labs to evaluate which have been negative -is to f/u with Dr. Fuller Plan  4. Benign essential hypertension -bp at goal with current therapy, cont same and monitor  5. Chronic left-sided low back pain with left-sided sciatica -cont gabapentin, tylenol (holding this, statin, mobic and metformin did not help her transaminases)  6. Chronic pain of both knees -cont tylenol and back on mobic, but still has considerable discomfort, using cane, weight loss and regular exercise encouraged with stationary bike (she cannot walk for exercise due to her back and knees)  7. Tobacco abuse, in remission -fortunately, she has not smoked for about 6 mos now   Labs/tests ordered:   Lab Orders     CBC with Differential/Platelet     COMPLETE METABOLIC PANEL WITH GFR     Hemoglobin A1c     Lipid panel   Next appt:  01/11/2020   Abhiram Criado L. Janet Humphreys, D.O. [facility] [address], [place] [address] Cell Phone (Mon-Fri 8am-5pm):  [phone] On Call:  [phone] & follow prompts after 5pm & weekends Office Phone:  [phone] Office Fax:  [phone]

## 2019-09-07 NOTE — Patient Instructions (Signed)
Try to be more active and ride the stationary bike.  Lay off the cinnamon rolls ;)

## 2019-09-15 DIAGNOSIS — Z961 Presence of intraocular lens: Secondary | ICD-10-CM | POA: Diagnosis not present

## 2019-09-15 DIAGNOSIS — H43813 Vitreous degeneration, bilateral: Secondary | ICD-10-CM | POA: Diagnosis not present

## 2019-09-15 DIAGNOSIS — H35033 Hypertensive retinopathy, bilateral: Secondary | ICD-10-CM | POA: Diagnosis not present

## 2019-09-15 DIAGNOSIS — H401131 Primary open-angle glaucoma, bilateral, mild stage: Secondary | ICD-10-CM | POA: Diagnosis not present

## 2019-09-15 DIAGNOSIS — E119 Type 2 diabetes mellitus without complications: Secondary | ICD-10-CM | POA: Diagnosis not present

## 2019-09-15 DIAGNOSIS — H26493 Other secondary cataract, bilateral: Secondary | ICD-10-CM | POA: Diagnosis not present

## 2019-09-19 ENCOUNTER — Other Ambulatory Visit: Payer: Self-pay

## 2019-09-19 ENCOUNTER — Other Ambulatory Visit: Payer: Self-pay | Admitting: Family

## 2019-09-19 ENCOUNTER — Other Ambulatory Visit: Payer: Self-pay | Admitting: Internal Medicine

## 2019-09-19 DIAGNOSIS — I10 Essential (primary) hypertension: Secondary | ICD-10-CM

## 2019-09-19 DIAGNOSIS — E785 Hyperlipidemia, unspecified: Secondary | ICD-10-CM

## 2019-09-19 DIAGNOSIS — E1169 Type 2 diabetes mellitus with other specified complication: Secondary | ICD-10-CM

## 2019-09-19 MED ORDER — ATORVASTATIN CALCIUM 40 MG PO TABS: ORAL_TABLET | ORAL | 1 refills | Status: DC

## 2019-09-19 NOTE — Telephone Encounter (Signed)
Refill request was refused for being too earl on 09/19/19. It was originally filled on 06/22/19 and should not have been refused. A new refill request was sent to the pharmacy.

## 2019-09-20 ENCOUNTER — Encounter: Payer: Self-pay | Admitting: Gastroenterology

## 2019-09-20 ENCOUNTER — Ambulatory Visit (AMBULATORY_SURGERY_CENTER): Payer: Medicare Other | Admitting: Gastroenterology

## 2019-09-20 ENCOUNTER — Other Ambulatory Visit: Payer: Self-pay

## 2019-09-20 VITALS — BP 124/70 | HR 68 | Temp 97.8°F | Resp 17 | Ht 66.0 in | Wt 214.0 lb

## 2019-09-20 DIAGNOSIS — K2950 Unspecified chronic gastritis without bleeding: Secondary | ICD-10-CM | POA: Diagnosis not present

## 2019-09-20 DIAGNOSIS — R1013 Epigastric pain: Secondary | ICD-10-CM | POA: Diagnosis not present

## 2019-09-20 DIAGNOSIS — I10 Essential (primary) hypertension: Secondary | ICD-10-CM | POA: Diagnosis not present

## 2019-09-20 DIAGNOSIS — B9681 Helicobacter pylori [H. pylori] as the cause of diseases classified elsewhere: Secondary | ICD-10-CM | POA: Diagnosis not present

## 2019-09-20 DIAGNOSIS — K2970 Gastritis, unspecified, without bleeding: Secondary | ICD-10-CM | POA: Diagnosis not present

## 2019-09-20 DIAGNOSIS — K219 Gastro-esophageal reflux disease without esophagitis: Secondary | ICD-10-CM | POA: Diagnosis not present

## 2019-09-20 MED ORDER — SODIUM CHLORIDE 0.9 % IV SOLN: 500.0000 mL | Freq: Once | INTRAVENOUS | Status: DC

## 2019-09-20 NOTE — Op Note (Signed)
[place] Patient Name: Alison [name] Procedure Date: 09/20/2019 9:25 AM MRN: [id number] Endoscopist: Ladene Artist , MD Age: 74 Referring MD:  Date of Birth: 11-09-1945 Gender: Female Account #: [id number] Procedure:                Upper GI endoscopy Indications:              Epigastric abdominal pain Medicines:                Monitored Anesthesia Care Procedure:                Pre-Anesthesia Assessment:                           - Prior to the procedure, a History and Physical                            was performed, and patient medications and                            allergies were reviewed. The patient's tolerance of                            previous anesthesia was also reviewed. The risks                            and benefits of the procedure and the sedation                            options and risks were discussed with the patient.                            All questions were answered, and informed consent                            was obtained. Prior Anticoagulants: The patient has                            taken no previous anticoagulant or antiplatelet                            agents. ASA Grade Assessment: II - A patient with                            mild systemic disease. After reviewing the risks                            and benefits, the patient was deemed in                            satisfactory condition to undergo the procedure.                           After obtaining informed consent, the endoscope was  passed under direct vision. Throughout the                            procedure, the patient's blood pressure, pulse, and                            oxygen saturations were monitored continuously. The                            Endoscope was introduced through the mouth, and                            advanced to the second part of duodenum. The upper                            GI endoscopy was  accomplished without difficulty.                            The patient tolerated the procedure well. Scope In: Scope Out: Findings:                 The examined esophagus was normal.                           Patchy mildly erythematous mucosa without bleeding                            was found in the gastric antrum. Biopsies were                            taken with a cold forceps for histology.                           The exam of the stomach was otherwise normal.                           The duodenal bulb and second portion of the                            duodenum were normal. Complications:            No immediate complications. Estimated Blood Loss:     Estimated blood loss was minimal. Impression:               - Normal esophagus.                           - Erythematous mucosa in the antrum. Biopsied.                           - Normal duodenal bulb and second portion of the                            duodenum. Recommendation:           - Patient has a contact number available for  emergencies. The signs and symptoms of potential                            delayed complications were discussed with the                            patient. Return to normal activities tomorrow.                            Written discharge instructions were provided to the                            patient.                           - Resume previous diet.                           - Continue present medications.                           - Await pathology results.                           - Return to GI office in 2 months. Ladene Artist, MD 09/20/2019 9:48:01 AM This report has been signed electronically.

## 2019-09-20 NOTE — Progress Notes (Signed)
Called to room to assist during endoscopic procedure.  Patient ID and intended procedure confirmed with present staff. Received instructions for my participation in the procedure from the performing physician.  

## 2019-09-20 NOTE — Progress Notes (Signed)
VS-CW 

## 2019-09-20 NOTE — Patient Instructions (Addendum)
HANDOUTS PROVIDED ON: Gastritis  The biopsies taken today have been sent for pathology.  The results can take 1-3 weeks to receive.    You may resume your previous diet and medication schedule.  Return to GI office in 2 months  Thank you for allowing [place] to care for you today!!!     YOU HAD AN ENDOSCOPIC PROCEDURE TODAY AT [place]:   Refer to the procedure report that was given to you for any specific questions about what was found during the examination.  If the procedure report does not answer your questions, please call your gastroenterologist to clarify.  If you requested that your care partner not be given the details of your procedure findings, then the procedure report has been included in a sealed envelope for you to review at your convenience later.  YOU SHOULD EXPECT: Some feelings of bloating in the abdomen. Passage of more gas than usual.  Walking can help get rid of the air that was put into your GI tract during the procedure and reduce the bloating.  Please Note:  You might notice some irritation and congestion in your nose or some drainage.  This is from the oxygen used during your procedure.  There is no need for concern and it should clear up in a day or so.  SYMPTOMS TO REPORT IMMEDIATELY:    Following upper endoscopy (EGD)  Vomiting of blood or coffee ground material  New chest pain or pain under the shoulder blades  Painful or persistently difficult swallowing  New shortness of breath  Fever of 100F or higher  Black, tarry-looking stools  For urgent or emergent issues, a gastroenterologist can be reached at any hour by calling [phone]. Do not use MyChart messaging for urgent concerns.    DIET:  We do recommend a small meal at first, but then you may proceed to your regular diet.  Drink plenty of fluids but you should avoid alcoholic beverages for 24 hours.  ACTIVITY:  You should plan to take it easy for the rest of today and you  should NOT DRIVE or use heavy machinery until tomorrow (because of the sedation medicines used during the test).    FOLLOW UP: Our staff will call the number listed on your records 48-72 hours following your procedure to check on you and address any questions or concerns that you may have regarding the information given to you following your procedure. If we do not reach you, we will leave a message.  We will attempt to reach you two times.  During this call, we will ask if you have developed any symptoms of COVID 19. If you develop any symptoms (ie: fever, flu-like symptoms, shortness of breath, cough etc.) before then, please call [phone].  If you test positive for Covid 19 in the 2 weeks post procedure, please call and report this information to [place].    If any biopsies were taken you will be contacted by phone or by letter within the next 1-3 weeks.  Please call us at [phone] if you have not heard about the biopsies in 3 weeks.    SIGNATURES/CONFIDENTIALITY: You and/or your care partner have signed paperwork which will be entered into your electronic medical record.  These signatures attest to the fact that that the information above on your After Visit Summary has been reviewed and is understood.  Full responsibility of the confidentiality of this discharge information lies with you and/or your care-partner.

## 2019-09-20 NOTE — Progress Notes (Signed)
Report to PACU, RN, vss, BBS= Clear.  

## 2019-09-22 ENCOUNTER — Telehealth: Payer: Self-pay | Admitting: *Deleted

## 2019-09-22 NOTE — Telephone Encounter (Signed)
1. Have you developed a fever since your procedure? no  2.   Have you had an respiratory symptoms (SOB or cough) since your procedure? no  3.   Have you tested positive for COVID 19 since your procedure no  4.   Have you had any family members/close contacts diagnosed with the COVID 19 since your procedure?  no   If yes to any of these questions please route to Joylene John, RN and Joella Prince, RN Follow up Call-  Call back number 09/20/2019 02/16/2018  Post procedure Call Back phone  # [phone]  Permission to leave phone message Yes Yes  Some recent data might be hidden     Patient questions:  Do you have a fever, pain , or abdominal swelling? No. Pain Score  0 *  Have you tolerated food without any problems? Yes.    Have you been able to return to your normal activities? Yes.    Do you have any questions about your discharge instructions: Diet   No. Medications  No. Follow up visit  No.  Do you have questions or concerns about your Care? No.  Actions: * If pain score is 4 or above: No action needed, pain <4.

## 2019-10-02 ENCOUNTER — Telehealth: Payer: Self-pay | Admitting: Gastroenterology

## 2019-10-02 ENCOUNTER — Other Ambulatory Visit: Payer: Self-pay

## 2019-10-02 MED ORDER — BISMUTH SUBSALICYLATE 262 MG PO CHEW: 524.0000 mg | CHEWABLE_TABLET | Freq: Four times a day (QID) | ORAL | 0 refills | Status: AC

## 2019-10-02 MED ORDER — METRONIDAZOLE 250 MG PO TABS: 250.0000 mg | ORAL_TABLET | Freq: Four times a day (QID) | ORAL | 0 refills | Status: AC

## 2019-10-02 MED ORDER — PANTOPRAZOLE SODIUM 40 MG PO TBEC: 40.0000 mg | DELAYED_RELEASE_TABLET | Freq: Two times a day (BID) | ORAL | 0 refills | Status: DC

## 2019-10-02 MED ORDER — DOXYCYCLINE HYCLATE 100 MG PO CAPS: 100.0000 mg | ORAL_CAPSULE | Freq: Two times a day (BID) | ORAL | 0 refills | Status: AC

## 2019-10-02 NOTE — Telephone Encounter (Signed)
Sheri, I think you were trying to reach patient.

## 2019-10-03 NOTE — Telephone Encounter (Signed)
Patient notified, see path results for details.

## 2019-10-11 ENCOUNTER — Telehealth: Payer: Self-pay | Admitting: Gastroenterology

## 2019-10-11 MED ORDER — FLUCONAZOLE 150 MG PO TABS: 150.0000 mg | ORAL_TABLET | Freq: Every day | ORAL | 0 refills | Status: DC

## 2019-10-11 NOTE — Telephone Encounter (Signed)
Patient believes she has a yeast infection since being on antibiotics. Can we send in diflucan to her pharmacy?

## 2019-10-11 NOTE — Telephone Encounter (Signed)
Informed patient that we sent fluconazole to her pharmacy. Patient verbalized understanding.

## 2019-10-11 NOTE — Telephone Encounter (Signed)
Fluconazole 150 mg po x 1

## 2019-10-13 ENCOUNTER — Encounter: Payer: Self-pay | Admitting: Podiatry

## 2019-10-13 ENCOUNTER — Other Ambulatory Visit: Payer: Self-pay

## 2019-10-13 ENCOUNTER — Ambulatory Visit (INDEPENDENT_AMBULATORY_CARE_PROVIDER_SITE_OTHER): Payer: Medicare Other | Admitting: Podiatry

## 2019-10-13 DIAGNOSIS — M79675 Pain in left toe(s): Secondary | ICD-10-CM

## 2019-10-13 DIAGNOSIS — E1149 Type 2 diabetes mellitus with other diabetic neurological complication: Secondary | ICD-10-CM

## 2019-10-13 DIAGNOSIS — M79674 Pain in right toe(s): Secondary | ICD-10-CM

## 2019-10-13 DIAGNOSIS — B351 Tinea unguium: Secondary | ICD-10-CM | POA: Diagnosis not present

## 2019-10-13 DIAGNOSIS — R601 Generalized edema: Secondary | ICD-10-CM

## 2019-10-13 NOTE — Progress Notes (Signed)
  Subjective:  Patient ID: Alison [name], female    DOB: 08/18/1945,  MRN: [id number]  Chief Complaint  Patient presents with  . Nail Problem    NAIL TRIM   74 y.o. female returns for the above complaint. Patient presents with thickened elongated dystrophic toenails x10. Patient is a diabetic with last A1c of 5.9. Patient states that she does not have diabetic shoes however she does not want to get any diabetic shoes either. She states that the nails have been getting really long she has not been able to debride on herself. She would like to know if we can debride down for her.  She states she has secondary complaint of mild edema to both lower extremity.  Patient states that she has not done anything to help with it.  She would like to discuss treatment options. Objective:  There were no vitals filed for this visit. Podiatric Exam: Vascular: dorsalis pedis and posterior tibial pulses are palpable bilateral. Capillary return is immediate. Temperature gradient is WNL. Skin turgor WNL  Sensorium: Decreased Semmes Weinstein monofilament test. Decreased tactile sensation bilaterally. Nail Exam: Pt has thick disfigured discolored nails with subungual debris noted bilateral entire nail hallux through fifth toenails.  Pain on palpation to the nails. Ulcer Exam: There is no evidence of ulcer or pre-ulcerative changes or infection. Orthopedic Exam: Muscle tone and strength are WNL. No limitations in general ROM. No crepitus or effusions noted. HAV  B/L.  Hammer toes 2-5  B/L. Skin: No Porokeratosis. No infection or ulcers mild lower extremity edema nonpitting    Assessment & Plan:   1. Generalized edema   2. Type 2 diabetes mellitus with neurological complications (HCC)   3. Pain due to onychomycosis of toenails of both feet     Patient was evaluated and treated and all questions answered.  Bilateral lower extremity edema nonpitting -I explained to the patient the etiology of edema and various  treatment options were discussed.  I believe patient will benefit from compression socks as well as aggressive elevation especially if she has been in a dependent position long period of time.  Patient states understanding.  She will start working on that.  Onychomycosis with pain  -Nails palliatively debrided as below. -Educated on self-care  Procedure: Nail Debridement Rationale: pain  Type of Debridement: manual, sharp debridement. Instrumentation: Nail nipper, rotary burr. Number of Nails: 10  Procedures and Treatment: Consent by patient was obtained for treatment procedures. The patient understood the discussion of treatment and procedures well. All questions were answered thoroughly reviewed. Debridement of mycotic and hypertrophic toenails, 1 through 5 bilateral and clearing of subungual debris. No ulceration, no infection noted.  Return Visit-Office Procedure: Patient instructed to return to the office for a follow up visit 3 months for continued evaluation and treatment.  Boneta Lucks, DPM    No follow-ups on file.

## 2019-11-01 DIAGNOSIS — Z23 Encounter for immunization: Secondary | ICD-10-CM | POA: Diagnosis not present

## 2019-11-20 DIAGNOSIS — Z23 Encounter for immunization: Secondary | ICD-10-CM | POA: Diagnosis not present

## 2019-11-21 DIAGNOSIS — M25562 Pain in left knee: Secondary | ICD-10-CM | POA: Diagnosis not present

## 2019-11-21 DIAGNOSIS — M25561 Pain in right knee: Secondary | ICD-10-CM | POA: Diagnosis not present

## 2019-11-22 ENCOUNTER — Ambulatory Visit (INDEPENDENT_AMBULATORY_CARE_PROVIDER_SITE_OTHER): Payer: Medicare Other | Admitting: Gastroenterology

## 2019-11-22 ENCOUNTER — Encounter: Payer: Self-pay | Admitting: Gastroenterology

## 2019-11-22 VITALS — BP 120/82 | HR 84 | Ht 66.0 in | Wt 216.2 lb

## 2019-11-22 DIAGNOSIS — K2970 Gastritis, unspecified, without bleeding: Secondary | ICD-10-CM | POA: Diagnosis not present

## 2019-11-22 DIAGNOSIS — B9681 Helicobacter pylori [H. pylori] as the cause of diseases classified elsewhere: Secondary | ICD-10-CM

## 2019-11-22 DIAGNOSIS — R7989 Other specified abnormal findings of blood chemistry: Secondary | ICD-10-CM | POA: Diagnosis not present

## 2019-11-22 NOTE — Patient Instructions (Addendum)
Your provider has requested that you go to the basement level for lab work before leaving today. Press "B" on the elevator. The lab is located at the first door on the left as you exit the elevator.  Before completing your H. Pylori stool antigen, please remain off your Protonix x 2 weeks. Then return stool sample.   Due to recent changes in healthcare laws, you may see the results of your imaging and laboratory studies on MyChart before your provider has had a chance to review them.  We understand that in some cases there may be results that are confusing or concerning to you. Not all laboratory results come back in the same time frame and the provider may be waiting for multiple results in order to interpret others.  Please give [place] 48 hours in order for your provider to thoroughly review all the results before contacting the office for clarification of your results.   Normal BMI (Body Mass Index- based on height and weight) is between 23 and 30. Your BMI today is Body mass index is 34.9 kg/m. Marland Kitchen Please consider follow up  regarding your BMI with your Primary Care Provider.   Thank you for choosing me and [facility].  Pricilla Riffle. Dagoberto Ligas., MD., Marval Regal

## 2019-11-22 NOTE — Progress Notes (Signed)
    History of Present Illness: This is a 74 year old female with epigastric pain. EGD in Sept showed H pylori gastritis which was treated.  She completed the course of therapy for H. pylori.  She developed a vaginal yeast infection which was treated with Diflucan and resolved.  She has intermittent mild epigastric discomfort, dyspeptic symptoms.  Current Medications, Allergies, Past Medical History, Past Surgical History, Family History and Social History were reviewed in [employer] record.   Physical Exam: General: Well developed, well nourished, no acute distress Head: Normocephalic and atraumatic Eyes:  sclerae anicteric, EOMI Ears: Normal auditory acuity Mouth: Not examined, mask on during Covid-19 pandemic Lungs: Clear throughout to auscultation Heart: Regular rate and rhythm; no murmurs, rubs or bruits Abdomen: Soft, non tender and non distended. No masses, hepatosplenomegaly or hernias noted. Normal Bowel sounds Rectal: Not done Musculoskeletal: Symmetrical with no gross deformities  Pulses:  Normal pulses noted Extremities: No clubbing, cyanosis, edema or deformities noted Neurological: Alert oriented x 4, grossly nonfocal Psychological:  Alert and cooperative. Normal mood and affect   Assessment and Recommendations:  1.  H. pylori gastritis.  Assess for eradication with a stool fecal antigen off PPIs for at least 14 days and then resume pantoprazole 40 mg daily.  2.  Cholelithiasis, possibly symptomatic.  3.  History of choledocholithiasis treated with ERCP and sphincterotomy in March 2021.  4.  Mild persistent elevations of transaminases and alkaline phosphatase, etiology unclear.  Await repeat LFTs within the next few weeks. REV in 3 months.

## 2019-11-24 ENCOUNTER — Other Ambulatory Visit: Payer: Self-pay

## 2019-11-24 ENCOUNTER — Encounter: Payer: Self-pay | Admitting: Physician Assistant

## 2019-11-24 ENCOUNTER — Ambulatory Visit (INDEPENDENT_AMBULATORY_CARE_PROVIDER_SITE_OTHER): Payer: Medicare Other | Admitting: Physician Assistant

## 2019-11-24 VITALS — BP 128/84 | HR 96

## 2019-11-24 DIAGNOSIS — R310 Gross hematuria: Secondary | ICD-10-CM | POA: Diagnosis not present

## 2019-11-24 DIAGNOSIS — R3121 Asymptomatic microscopic hematuria: Secondary | ICD-10-CM | POA: Diagnosis not present

## 2019-11-24 NOTE — Progress Notes (Signed)
11/24/2019 11:25 AM   Alison [name] 19-Jul-1945 [id number]  CC: Chief Complaint  Patient presents with  . Hematuria   HPI: Alison [name] is a 74 y.o. female with a history of gross hematuria with benign work-up in 2021 who presents today for repeat UA.  She denies dysuria, gross hematuria, or flank pain over the past 6 months. No acute concerns today.  Notably, she reports living in [place] and wishes to seek care at a urologic practice closer to home. She believes her insurance coverage may be changing with the new year and is unsure which local practices may be covered under her new plan.  In-office UA today positive for trace glucose, trace ketones, 2+ blood, and trace protein; urine microscopy with 11-30 RBCs/HPF, and few bacteria.   PMH: Past Medical History:  Diagnosis Date  . Anxiety   . Benign essential hypertension   . Blood transfusion without reported diagnosis   . Cataract    removed bilateraly  . Diabetes mellitus without complication ([place])   . Diverticulitis of colon (without mention of hemorrhage)(562.11)   . GERD (gastroesophageal reflux disease)   . Goiter, specified as simple   . Helicobacter pylori gastritis   . Hyperlipidemia LDL goal < 100   . Intrahepatic bile duct dilation   . Numbness    TOES RT FOOT and toes left foot  . Obesity, unspecified   . Osteoarthrosis, unspecified whether generalized or localized, lower leg   . Osteopenia   . Other specified disease of nail    FUNGUS OF FINGERNAILS  . Pernicious anemia   . Unspecified glaucoma(365.9)   . Unspecified vitamin D deficiency   . Wears dentures   . Wears glasses     Surgical History: Past Surgical History:  Procedure Laterality Date  . BACK SURGERY    . CATARACT EXTRACTION     right eye 06/20/15 left eye 09/12/15  . COLECTOMY  2009  . COLONOSCOPY  09/06/2007   Dr Carol Ada  . DILATION AND CURETTAGE OF UTERUS    . ENDOSCOPIC RETROGRADE CHOLANGIOPANCREATOGRAPHY (ERCP) WITH  PROPOFOL N/A 03/17/2019   Procedure: ENDOSCOPIC RETROGRADE CHOLANGIOPANCREATOGRAPHY (ERCP) WITH PROPOFOL;  Surgeon: Ladene Artist, MD;  Location: [facility] ENDOSCOPY;  Service: Endoscopy;  Laterality: N/A;  . JOINT REPLACEMENT     right  . LUMBAR LAMINECTOMY/DECOMPRESSION MICRODISCECTOMY N/A 03/30/2013   Procedure: CENTRAL DECOMPRESSION L4 - L5 AND EXCISION 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;  Surgeon: Tobi Bastos, MD;  Location: [facility];  Service: Orthopedics;  Laterality: N/A;  . MULTIPLE TOOTH EXTRACTIONS    . REMOVAL OF STONES  03/17/2019   Procedure: REMOVAL OF STONES;  Surgeon: Ladene Artist, MD;  Location: [facility] ENDOSCOPY;  Service: Endoscopy;;  . SIGMOIDOSCOPY  02/16/2018   Dr Fuller Plan  . SPHINCTEROTOMY  03/17/2019   Procedure: SPHINCTEROTOMY;  Surgeon: Ladene Artist, MD;  Location: [place];  Service: Endoscopy;;  . TOTAL KNEE ARTHROPLASTY Right 02/01/2013   Procedure: RIGHT TOTAL KNEE ARTHROPLASTY;  Surgeon: Tobi Bastos, MD;  Location: [facility];  Service: Orthopedics;  Laterality: Right;  . TOTAL KNEE ARTHROPLASTY Left 04/18/2014   Procedure: LEFT TOTAL KNEE ARTHROPLASTY;  Surgeon: Latanya Maudlin, MD;  Location: [facility];  Service: Orthopedics;  Laterality: Left;    Home Medications:  Allergies as of 11/24/2019      Reactions   Aspirin Nausea Only   Can take 81 mg but can't take 325mg , hard on stomach.      Medication List  Accurate as of November 24, 2019 11:25 AM. If you have any questions, ask your nurse or doctor.        acetaminophen 650 MG CR tablet Commonly known as: TYLENOL Take 1,300 mg by mouth in the morning and at bedtime.   aspirin EC 81 MG tablet Take 81 mg by mouth daily.   atorvastatin 40 MG tablet Commonly known as: LIPITOR TAKE 1 TABLET BY MOUTH ONCE DAILY AT  6PM   CALCIUM 600 + D PO Take 1 tablet by mouth daily.   carboxymethylcellulose 0.5 % Soln Commonly known as: REFRESH PLUS Place 1 drop into both eyes 3 (three) times daily as needed (dry  eyes).   Combigan 0.2-0.5 % ophthalmic solution Generic drug: brimonidine-timolol Place 1 drop into both eyes in the morning and at bedtime.   gabapentin 300 MG capsule Commonly known as: NEURONTIN Take 1 capsule (300 mg total) by mouth at bedtime.   ICY HOT EX Apply 1 application topically daily as needed (pain).   latanoprost 0.005 % ophthalmic solution Commonly known as: XALATAN Place 1 drop into both eyes at bedtime.   lisinopril 10 MG tablet Commonly known as: ZESTRIL Take 1 tablet by mouth once daily   meloxicam 15 MG tablet Commonly known as: MOBIC Take 15 mg by mouth daily.   metFORMIN 500 MG tablet Commonly known as: GLUCOPHAGE TAKE 1/2 (ONE-HALF) TABLET BY MOUTH IN THE EVENING WITH SUPPER What changed: See the new instructions.   multivitamin tablet Take 1 tablet by mouth daily.   OMEGA 3 PO Take 1,200 mg by mouth daily.   pantoprazole 40 MG tablet Commonly known as: PROTONIX Take 1 tablet (40 mg total) by mouth daily.   THERAWORX RELIEF EX Apply 1 application topically daily as needed (pain).   trolamine salicylate 10 % cream Commonly known as: ASPERCREME Apply 1 application topically as needed for muscle pain.   Vitamin D-3 25 MCG (1000 UT) Caps Take 1,000 Units by mouth daily.       Allergies:  Allergies  Allergen Reactions  . Aspirin Nausea Only    Can take 81 mg but can't take 325mg , hard on stomach.    Family History: Family History  Problem Relation Age of Onset  . Alzheimer's disease Mother   . Stroke Mother   . Hyperlipidemia Mother   . Heart disease Father   . Diabetes Sister   . Colon polyps Neg Hx   . Colon cancer Neg Hx   . Esophageal cancer Neg Hx   . Rectal cancer Neg Hx   . Stomach cancer Neg Hx     Social History:   reports that she quit smoking about 8 months ago. Her smoking use included cigarettes. She has a 15.00 pack-year smoking history. She has never used smokeless tobacco. She reports that she does not  drink alcohol and does not use drugs.  Physical Exam: BP 128/84   Pulse 96   Constitutional:  Alert and oriented, no acute distress, nontoxic appearing HEENT: [place], AT Cardiovascular: No clubbing, cyanosis, or edema Respiratory: Normal respiratory effort, no increased work of breathing Skin: No rashes, bruises or suspicious lesions Neurologic: Grossly intact, no focal deficits, moving all 4 extremities Psychiatric: Normal mood and affect  Laboratory Data: Results for orders placed or performed in visit on 11/24/19  Microscopic Examination   Urine  Result Value Ref Range   WBC, UA 0-5 0 - 5 /hpf   RBC 11-30 (A) 0 - 2 /hpf   Epithelial Cells (  non renal) 0-10 0 - 10 /hpf   Renal Epithel, UA 0-10 (A) None seen /hpf   Bacteria, UA WILL FOLLOW   Urinalysis, Complete  Result Value Ref Range   Specific Gravity, UA 1.020 1.005 - 1.030   pH, UA 5.0 5.0 - 7.5   Color, UA Yellow Yellow   Appearance Ur Cloudy (A) Clear   Leukocytes,UA Negative Negative   Protein,UA Trace (A) Negative/Trace   Glucose, UA Trace (A) Negative   Ketones, UA Trace (A) Negative   RBC, UA 2+ (A) Negative   Bilirubin, UA Negative Negative   Urobilinogen, Ur 0.2 0.2 - 1.0 mg/dL   Nitrite, UA Negative Negative   Microscopic Examination See below:    Assessment & Plan:   1. Asymptomatic microscopic hematuria Persistent on UA today. We will plan for urine rechecks every 6 months with likely need for repeat hematuria evaluation every 2 to 3 years per AUA guidelines.  Counseled patient that it is important for her to maintain follow-up urologic care for monitoring of her hematuria. She expressed understanding.  May consider referral to [facility] in the future per her new health insurance information. We will plan for 87-month follow-up in our office but counseled patient to contact us if she wishes to pursue care in [place] and we will place a referral for her. - Urinalysis, Complete   Return in about 6  months (around 05/23/2020) for Repeat UA.  Debroah Loop, PA-C  [facility] [address] [place], [place] [address] [phone]

## 2019-11-25 LAB — MICROSCOPIC EXAMINATION

## 2019-11-25 LAB — URINALYSIS, COMPLETE
Bilirubin, UA: NEGATIVE
Leukocytes,UA: NEGATIVE
Nitrite, UA: NEGATIVE
Specific Gravity, UA: 1.02 (ref 1.005–1.030)
Urobilinogen, Ur: 0.2 mg/dL (ref 0.2–1.0)
pH, UA: 5 (ref 5.0–7.5)

## 2019-12-11 ENCOUNTER — Other Ambulatory Visit: Payer: Medicare Other

## 2019-12-11 DIAGNOSIS — B9681 Helicobacter pylori [H. pylori] as the cause of diseases classified elsewhere: Secondary | ICD-10-CM

## 2019-12-11 DIAGNOSIS — K2970 Gastritis, unspecified, without bleeding: Secondary | ICD-10-CM | POA: Diagnosis not present

## 2019-12-12 LAB — HELICOBACTER PYLORI  SPECIAL ANTIGEN
MICRO NUMBER:: 11251796
SPECIMEN QUALITY: ADEQUATE

## 2019-12-13 ENCOUNTER — Telehealth: Payer: Self-pay | Admitting: Gastroenterology

## 2019-12-13 NOTE — Telephone Encounter (Signed)
See results notes. 

## 2019-12-16 ENCOUNTER — Other Ambulatory Visit: Payer: Self-pay | Admitting: Internal Medicine

## 2019-12-16 ENCOUNTER — Other Ambulatory Visit: Payer: Self-pay | Admitting: Family

## 2019-12-16 DIAGNOSIS — E1142 Type 2 diabetes mellitus with diabetic polyneuropathy: Secondary | ICD-10-CM

## 2019-12-16 DIAGNOSIS — I10 Essential (primary) hypertension: Secondary | ICD-10-CM

## 2019-12-18 NOTE — Telephone Encounter (Signed)
rx sent to pharmacy by e-script  

## 2020-01-08 ENCOUNTER — Other Ambulatory Visit: Payer: Self-pay

## 2020-01-08 ENCOUNTER — Other Ambulatory Visit: Payer: Medicare Other

## 2020-01-08 DIAGNOSIS — I10 Essential (primary) hypertension: Secondary | ICD-10-CM | POA: Diagnosis not present

## 2020-01-08 DIAGNOSIS — E1149 Type 2 diabetes mellitus with other diabetic neurological complication: Secondary | ICD-10-CM

## 2020-01-08 DIAGNOSIS — E1169 Type 2 diabetes mellitus with other specified complication: Secondary | ICD-10-CM | POA: Diagnosis not present

## 2020-01-08 DIAGNOSIS — G8929 Other chronic pain: Secondary | ICD-10-CM

## 2020-01-08 DIAGNOSIS — R7401 Elevation of levels of liver transaminase levels: Secondary | ICD-10-CM

## 2020-01-08 DIAGNOSIS — M5442 Lumbago with sciatica, left side: Secondary | ICD-10-CM | POA: Diagnosis not present

## 2020-01-08 DIAGNOSIS — E785 Hyperlipidemia, unspecified: Secondary | ICD-10-CM | POA: Diagnosis not present

## 2020-01-08 DIAGNOSIS — M25561 Pain in right knee: Secondary | ICD-10-CM | POA: Diagnosis not present

## 2020-01-08 DIAGNOSIS — M25562 Pain in left knee: Secondary | ICD-10-CM | POA: Diagnosis not present

## 2020-01-09 LAB — CBC WITH DIFFERENTIAL/PLATELET
Absolute Monocytes: 685 cells/uL (ref 200–950)
Basophils Absolute: 71 cells/uL (ref 0–200)
Basophils Relative: 0.8 %
Eosinophils Absolute: 258 cells/uL (ref 15–500)
Eosinophils Relative: 2.9 %
HCT: 40 % (ref 35.0–45.0)
Hemoglobin: 12.8 g/dL (ref 11.7–15.5)
Lymphs Abs: 2172 cells/uL (ref 850–3900)
MCH: 28.8 pg (ref 27.0–33.0)
MCHC: 32 g/dL (ref 32.0–36.0)
MCV: 89.9 fL (ref 80.0–100.0)
MPV: 10.3 fL (ref 7.5–12.5)
Monocytes Relative: 7.7 %
Neutro Abs: 5714 cells/uL (ref 1500–7800)
Neutrophils Relative %: 64.2 %
Platelets: 333 10*3/uL (ref 140–400)
RBC: 4.45 10*6/uL (ref 3.80–5.10)
RDW: 12.3 % (ref 11.0–15.0)
Total Lymphocyte: 24.4 %
WBC: 8.9 10*3/uL (ref 3.8–10.8)

## 2020-01-09 LAB — COMPLETE METABOLIC PANEL WITH GFR
AG Ratio: 1.2 (calc) (ref 1.0–2.5)
ALT: 22 U/L (ref 6–29)
AST: 24 U/L (ref 10–35)
Albumin: 4.3 g/dL (ref 3.6–5.1)
Alkaline phosphatase (APISO): 89 U/L (ref 37–153)
BUN: 10 mg/dL (ref 7–25)
CO2: 30 mmol/L (ref 20–32)
Calcium: 9.8 mg/dL (ref 8.6–10.4)
Chloride: 103 mmol/L (ref 98–110)
Creat: 0.89 mg/dL (ref 0.60–0.93)
GFR, Est African American: 73 mL/min/{1.73_m2} (ref >=60)
GFR, Est Non African American: 63 mL/min/{1.73_m2} (ref >=60)
Globulin: 3.5 g/dL (calc) (ref 1.9–3.7)
Glucose, Bld: 110 mg/dL — ABNORMAL HIGH (ref 65–99)
Potassium: 4.6 mmol/L (ref 3.5–5.3)
Sodium: 139 mmol/L (ref 135–146)
Total Bilirubin: 0.6 mg/dL (ref 0.2–1.2)
Total Protein: 7.8 g/dL (ref 6.1–8.1)

## 2020-01-09 LAB — HEMOGLOBIN A1C
Hgb A1c MFr Bld: 6.1 % of total Hgb — ABNORMAL HIGH (ref <5.7)
Mean Plasma Glucose: 128 mg/dL
eAG (mmol/L): 7.1 mmol/L

## 2020-01-09 LAB — LIPID PANEL
Cholesterol: 129 mg/dL (ref <200)
HDL: 41 mg/dL — ABNORMAL LOW (ref >=50)
LDL Cholesterol (Calc): 69 mg/dL (calc)
Non-HDL Cholesterol (Calc): 88 mg/dL (calc) (ref <130)
Total CHOL/HDL Ratio: 3.1 (calc) (ref <5.0)
Triglycerides: 102 mg/dL (ref <150)

## 2020-01-09 NOTE — Progress Notes (Signed)
Labs overall ok but hba1c trending upward (sugar)

## 2020-01-11 ENCOUNTER — Ambulatory Visit (INDEPENDENT_AMBULATORY_CARE_PROVIDER_SITE_OTHER): Payer: Medicare Other | Admitting: Internal Medicine

## 2020-01-11 ENCOUNTER — Other Ambulatory Visit: Payer: Self-pay

## 2020-01-11 ENCOUNTER — Encounter: Payer: Self-pay | Admitting: Internal Medicine

## 2020-01-11 VITALS — BP 134/76 | HR 73 | Temp 96.9°F | Ht 66.0 in | Wt 218.0 lb

## 2020-01-11 DIAGNOSIS — R7401 Elevation of levels of liver transaminase levels: Secondary | ICD-10-CM

## 2020-01-11 DIAGNOSIS — G8929 Other chronic pain: Secondary | ICD-10-CM

## 2020-01-11 DIAGNOSIS — E1149 Type 2 diabetes mellitus with other diabetic neurological complication: Secondary | ICD-10-CM | POA: Diagnosis not present

## 2020-01-11 DIAGNOSIS — M25561 Pain in right knee: Secondary | ICD-10-CM

## 2020-01-11 DIAGNOSIS — M25562 Pain in left knee: Secondary | ICD-10-CM | POA: Diagnosis not present

## 2020-01-11 DIAGNOSIS — M5442 Lumbago with sciatica, left side: Secondary | ICD-10-CM

## 2020-01-11 DIAGNOSIS — E1169 Type 2 diabetes mellitus with other specified complication: Secondary | ICD-10-CM

## 2020-01-11 DIAGNOSIS — E785 Hyperlipidemia, unspecified: Secondary | ICD-10-CM

## 2020-01-11 DIAGNOSIS — I10 Essential (primary) hypertension: Secondary | ICD-10-CM | POA: Diagnosis not present

## 2020-01-11 MED ORDER — TRAMADOL HCL 50 MG PO TABS: 50.0000 mg | ORAL_TABLET | Freq: Every day | ORAL | 0 refills | Status: AC

## 2020-01-11 NOTE — Patient Instructions (Signed)
Call [facility] and tell them you want to start coming to [place] so you can schedule an appt here closer by.

## 2020-01-11 NOTE — Progress Notes (Signed)
Location:  [facility] clinic Provider:  Prentice Sackrider L. Mariea Clonts, D.O., C.M.D.  Goals of Care:  Advanced Directives 01/11/2020  Does Patient Have a Medical Advance Directive? No  Type of Advance Directive -  Does patient want to make changes to medical advance directive? -  Would patient like information on creating a medical advance directive? Yes (MAU/Ambulatory/Procedural Areas - Information given)  Pre-existing out of facility DNR order (yellow form or pink MOST form) -   Chief Complaint  Patient presents with  . Medical Management of Chronic Issues    4 month follow-up and discuss labs (copy printed). Foot exam due today.    HPI: Patient is a 74 y.o. female seen today for medical management of chronic diseases.  She's been good.    C/o her knees.  Outside of that, feels pretty good.  They hurt on the inside.  Takes the mobic.  She guesses it helps.  Has cut back on the tylenol--uses once at night.  Both thumbs and her third digit hurt her.   Sometimes gets a little nauseous and has a pill for that now.  Has not had real pain in her right upper quadrant or right shoulder.  Liver tests normal right now.    Past Medical History:  Diagnosis Date  . Anxiety   . Benign essential hypertension   . Blood transfusion without reported diagnosis   . Cataract    removed bilateraly  . Diabetes mellitus without complication ([place])   . Diverticulitis of colon (without mention of hemorrhage)(562.11)   . GERD (gastroesophageal reflux disease)   . Goiter, specified as simple   . Helicobacter pylori gastritis   . Hyperlipidemia LDL goal < 100   . Intrahepatic bile duct dilation   . Numbness    TOES RT FOOT and toes left foot  . Obesity, unspecified   . Osteoarthrosis, unspecified whether generalized or localized, lower leg   . Osteopenia   . Other specified disease of nail    FUNGUS OF FINGERNAILS  . Pernicious anemia   . Unspecified glaucoma(365.9)   . Unspecified vitamin D deficiency   . Wears  dentures   . Wears glasses     Past Surgical History:  Procedure Laterality Date  . BACK SURGERY    . CATARACT EXTRACTION     right eye 06/20/15 left eye 09/12/15  . COLECTOMY  2009  . COLONOSCOPY  09/06/2007   Dr Carol Ada  . DILATION AND CURETTAGE OF UTERUS    . ENDOSCOPIC RETROGRADE CHOLANGIOPANCREATOGRAPHY (ERCP) WITH PROPOFOL N/A 03/17/2019   Procedure: ENDOSCOPIC RETROGRADE CHOLANGIOPANCREATOGRAPHY (ERCP) WITH PROPOFOL;  Surgeon: Ladene Artist, MD;  Location: [facility] ENDOSCOPY;  Service: Endoscopy;  Laterality: N/A;  . JOINT REPLACEMENT     right  . LUMBAR LAMINECTOMY/DECOMPRESSION MICRODISCECTOMY N/A 03/30/2013   Procedure: CENTRAL DECOMPRESSION L4 - L5 AND EXCISION 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;  Surgeon: Tobi Bastos, MD;  Location: [facility];  Service: Orthopedics;  Laterality: N/A;  . MULTIPLE TOOTH EXTRACTIONS    . REMOVAL OF STONES  03/17/2019   Procedure: REMOVAL OF STONES;  Surgeon: Ladene Artist, MD;  Location: [facility] ENDOSCOPY;  Service: Endoscopy;;  . SIGMOIDOSCOPY  02/16/2018   Dr Fuller Plan  . SPHINCTEROTOMY  03/17/2019   Procedure: SPHINCTEROTOMY;  Surgeon: Ladene Artist, MD;  Location: [place];  Service: Endoscopy;;  . TOTAL KNEE ARTHROPLASTY Right 02/01/2013   Procedure: RIGHT TOTAL KNEE ARTHROPLASTY;  Surgeon: Tobi Bastos, MD;  Location: [facility];  Service:  Orthopedics;  Laterality: Right;  . TOTAL KNEE ARTHROPLASTY Left 04/18/2014   Procedure: LEFT TOTAL KNEE ARTHROPLASTY;  Surgeon: Latanya Maudlin, MD;  Location: [facility];  Service: Orthopedics;  Laterality: Left;    Allergies  Allergen Reactions  . Aspirin Nausea Only    Can take 81 mg but can't take 325mg , hard on stomach.    Outpatient Encounter Medications as of 01/11/2020  Medication Sig  . acetaminophen (TYLENOL) 650 MG CR tablet Take 1,300 mg by mouth in the morning and at bedtime.  Marland Kitchen aspirin EC 81 MG tablet Take 81 mg by mouth daily.  Marland Kitchen atorvastatin (LIPITOR) 40 MG tablet TAKE 1 TABLET BY MOUTH ONCE  DAILY AT  6PM  . Calcium Carb-Cholecalciferol (CALCIUM 600 + D PO) Take 1 tablet by mouth daily.  . carboxymethylcellulose (REFRESH PLUS) 0.5 % SOLN Place 1 drop into both eyes 3 (three) times daily as needed (dry eyes).   . Cholecalciferol (VITAMIN D-3) 1000 UNITS CAPS Take 1,000 Units by mouth daily.   . COMBIGAN 0.2-0.5 % ophthalmic solution Place 1 drop into both eyes in the morning and at bedtime.   . gabapentin (NEURONTIN) 300 MG capsule Take 1 capsule (300 mg total) by mouth at bedtime.  . Homeopathic Products (THERAWORX RELIEF EX) Apply 1 application topically daily as needed (pain).  Marland Kitchen latanoprost (XALATAN) 0.005 % ophthalmic solution Place 1 drop into both eyes at bedtime.  Marland Kitchen lisinopril (ZESTRIL) 10 MG tablet Take 1 tablet by mouth once daily  . meloxicam (MOBIC) 15 MG tablet Take 15 mg by mouth daily.   . Menthol, Topical Analgesic, (ICY HOT EX) Apply 1 application topically daily as needed (pain).  . metFORMIN (GLUCOPHAGE) 500 MG tablet TAKE 1/2 (ONE-HALF) TABLET BY MOUTH IN THE EVENING WITH SUPPER  . Multiple Vitamin (MULTIVITAMIN) tablet Take 1 tablet by mouth daily.  . Omega-3 Fatty Acids (OMEGA 3 PO) Take 1,200 mg by mouth daily.  . pantoprazole (PROTONIX) 40 MG tablet Take 1 tablet (40 mg total) by mouth daily.  Marland Kitchen trolamine salicylate (ASPERCREME) 10 % cream Apply 1 application topically as needed for muscle pain.   No facility-administered encounter medications on file as of 01/11/2020.    Review of Systems:  Review of Systems  Constitutional: Negative for chills, fever and malaise/fatigue.  HENT: Negative for congestion and sore throat.   Eyes: Negative for blurred vision.       Glasses  Respiratory: Negative for cough and shortness of breath.   Cardiovascular: Negative for chest pain, palpitations and leg swelling.  Gastrointestinal: Negative for abdominal pain, blood in stool, constipation, diarrhea and melena.       Occasional nausea  Genitourinary: Negative for  dysuria.  Musculoskeletal: Positive for back pain and joint pain. Negative for falls.  Neurological: Negative for dizziness and loss of consciousness.       Some balance problem  Endo/Heme/Allergies: Does not bruise/bleed easily.  Psychiatric/Behavioral: Negative for depression. The patient is not nervous/anxious.        Some nights does not sleep b/c up urinating    Health Maintenance  Topic Date Due  . FOOT EXAM  12/28/2017  . COVID-19 Vaccine (4 - Booster for Pfizer series) 05/01/2020  . OPHTHALMOLOGY EXAM  05/15/2020  . HEMOGLOBIN A1C  07/08/2020  . COLONOSCOPY (Pts 45-61yrs Insurance coverage will need to be confirmed)  02/17/2023  . TETANUS/TDAP  10/29/2023  . INFLUENZA VACCINE  Completed  . DEXA SCAN  Completed  . Hepatitis C Screening  Completed  .  PNA vac Low Risk Adult  Completed    Physical Exam: Vitals:   01/11/20 1024  BP: 134/76  Pulse: 73  Temp: (!) 96.9 F (36.1 C)  TempSrc: Temporal  SpO2: 97%  Weight: 218 lb (98.9 kg)  Height: 5\' 6"  (1.676 m)   Body mass index is 35.19 kg/m. Physical Exam Vitals reviewed.  Constitutional:      Appearance: Normal appearance.  Eyes:     Extraocular Movements: Extraocular movements intact.     Pupils: Pupils are equal, round, and reactive to light.     Comments: glasses  Cardiovascular:     Rate and Rhythm: Normal rate and regular rhythm.     Pulses: Normal pulses.     Heart sounds: Normal heart sounds.  Pulmonary:     Effort: Pulmonary effort is normal.     Breath sounds: Normal breath sounds. No wheezing, rhonchi or rales.  Abdominal:     General: Bowel sounds are normal.  Musculoskeletal:        General: Normal range of motion.     Right lower leg: No edema.     Left lower leg: No edema.     Comments: Uses cane  Neurological:     General: No focal deficit present.     Mental Status: She is alert and oriented to person, place, and time.     Comments: Diabetic foot exam was performed with the following  findings:   Normal sensation of 10g monofilament Intact posterior tibialis and dorsalis pedis pulses Fungal toenails     Psychiatric:        Mood and Affect: Mood normal.        Behavior: Behavior normal.     Labs reviewed: Basic Metabolic Panel: Recent Labs    05/29/19 0924 09/04/19 1019 01/08/20 0830  NA 135 137 139  K 4.5 4.4 4.6  CL 101 105 103  CO2 29 27 30   GLUCOSE 109* 100* 110*  BUN 12 9 10   CREATININE 0.87 0.75 0.89  CALCIUM 9.9 9.5 9.8  TSH 0.53 0.48  --    Liver Function Tests: Recent Labs    03/14/19 1159 05/17/19 0924 05/29/19 0924 09/04/19 1019 01/08/20 0830  AST 23   < > 52* 46* 24  ALT 21   < > 77* 50* 22  ALKPHOS 77  --   --   --   --   BILITOT 0.4   < > 0.5 0.4 0.6  PROT 8.2   < > 8.2* 8.1 7.8  ALBUMIN 4.2  --   --   --   --    < > = values in this interval not displayed.   No results for input(s): LIPASE, AMYLASE in the last 8760 hours. No results for input(s): AMMONIA in the last 8760 hours. CBC: Recent Labs    05/29/19 0924 09/04/19 1019 01/08/20 0830  WBC 10.7 9.4 8.9  NEUTROABS 7,340 6,082 5,714  HGB 12.3 12.4 12.8  HCT 38.1 38.4 40.0  MCV 88.2 88.5 89.9  PLT 417* 342 333   Lipid Panel: Recent Labs    05/29/19 0924 09/04/19 1019 01/08/20 0830  CHOL 159 123 129  HDL 40* 45* 41*  LDLCALC 102* 61 69  TRIG 84 83 102  CHOLHDL 4.0 2.7 3.1   Lab Results  Component Value Date   HGBA1C 6.1 (H) 01/08/2020    Procedures since last visit: No results found.  Assessment/Plan 1. Chronic left-sided low back pain with left-sided sciatica - pain  in back less problematic lately but knees are worse -cont gabapentin - traMADol (ULTRAM) 50 MG tablet; Take 1 tablet (50 mg total) by mouth at bedtime.  Dispense: 30 tablet; Refill: 0  2. Chronic pain of both knees - not adequate relief with tylenol and mobic from ortho - traMADol (ULTRAM) 50 MG tablet; Take 1 tablet (50 mg total) by mouth at bedtime.  Dispense: 30 tablet; Refill:  0  3. Type 2 diabetes mellitus with neurological complications (HCC) -cont metformin, dietary improvements, ace, statin and baby asa  4. Hyperlipidemia associated with type 2 diabetes mellitus ([place]) -cont lipitor therapy  5. Transaminitis -intermittent--better right now -followed by Dr. Fuller Plan about cholecystitis  6. Benign essential hypertension -bp at goal with current regimen, cont same regimen and monitor  Labs/tests ordered:   Lab Orders     CBC with Differential/Platelet     COMPLETE METABOLIC PANEL WITH GFR     Hemoglobin A1c     Lipid panel   Next appt:  06/14/2020   Cisco Kindt L. Aysel Gilchrest, D.O. [facility] [address], [place] [address] Cell Phone (Mon-Fri 8am-5pm):  [phone] On Call:  [phone] & follow prompts after 5pm & weekends Office Phone:  [phone] Office Fax:  [phone]

## 2020-01-17 ENCOUNTER — Ambulatory Visit (INDEPENDENT_AMBULATORY_CARE_PROVIDER_SITE_OTHER): Payer: Medicare Other | Admitting: Podiatry

## 2020-01-17 ENCOUNTER — Other Ambulatory Visit: Payer: Self-pay

## 2020-01-17 DIAGNOSIS — M79675 Pain in left toe(s): Secondary | ICD-10-CM

## 2020-01-17 DIAGNOSIS — B351 Tinea unguium: Secondary | ICD-10-CM | POA: Diagnosis not present

## 2020-01-17 DIAGNOSIS — M79674 Pain in right toe(s): Secondary | ICD-10-CM

## 2020-01-17 DIAGNOSIS — E1149 Type 2 diabetes mellitus with other diabetic neurological complication: Secondary | ICD-10-CM | POA: Diagnosis not present

## 2020-01-18 ENCOUNTER — Encounter: Payer: Self-pay | Admitting: Podiatry

## 2020-01-18 NOTE — Progress Notes (Signed)
  Subjective:  Patient ID: Alison [name], female    DOB: 10-11-1945,  MRN: [id number]  Chief Complaint  Patient presents with  . routine foot care    Nail trim    75 y.o. female returns for the above complaint. Patient presents with thickened elongated dystrophic toenails x10. Patient is a diabetic with last A1c of 6.1. Patient states that she does not have diabetic shoes however she does not want to get any diabetic shoes either. She states that the nails have been getting really long she has not been able to debride on herself. She would like to know if we can debride down for her.   Objective:  There were no vitals filed for this visit. Podiatric Exam: Vascular: dorsalis pedis and posterior tibial pulses are palpable bilateral. Capillary return is immediate. Temperature gradient is WNL. Skin turgor WNL  Sensorium: Decreased Semmes Weinstein monofilament test. Decreased tactile sensation bilaterally. Nail Exam: Pt has thick disfigured discolored nails with subungual debris noted bilateral entire nail hallux through fifth toenails.  Pain on palpation to the nails. Ulcer Exam: There is no evidence of ulcer or pre-ulcerative changes or infection. Orthopedic Exam: Muscle tone and strength are WNL. No limitations in general ROM. No crepitus or effusions noted. HAV  B/L.  Hammer toes 2-5  B/L. Skin: No Porokeratosis. No infection or ulcers mild lower extremity edema nonpitting    Assessment & Plan:   1. Type 2 diabetes mellitus with neurological complications (HCC)   2. Pain due to onychomycosis of toenails of both feet     Patient was evaluated and treated and all questions answered.  Bilateral lower extremity edema nonpitting -I explained to the patient the etiology of edema and various treatment options were discussed.  I believe patient will benefit from compression socks as well as aggressive elevation especially if she has been in a dependent position long period of time.  Patient  states understanding.  She will start working on that.  Onychomycosis with pain  -Nails palliatively debrided as below. -Educated on self-care  Procedure: Nail Debridement Rationale: pain  Type of Debridement: manual, sharp debridement. Instrumentation: Nail nipper, rotary burr. Number of Nails: 10  Procedures and Treatment: Consent by patient was obtained for treatment procedures. The patient understood the discussion of treatment and procedures well. All questions were answered thoroughly reviewed. Debridement of mycotic and hypertrophic toenails, 1 through 5 bilateral and clearing of subungual debris. No ulceration, no infection noted.  Return Visit-Office Procedure: Patient instructed to return to the office for a follow up visit 3 months for continued evaluation and treatment.  Nicholes Rough, DPM    No follow-ups on file.

## 2020-02-01 ENCOUNTER — Other Ambulatory Visit: Payer: Self-pay | Admitting: Internal Medicine

## 2020-02-01 DIAGNOSIS — E1149 Type 2 diabetes mellitus with other diabetic neurological complication: Secondary | ICD-10-CM

## 2020-02-06 DIAGNOSIS — H43813 Vitreous degeneration, bilateral: Secondary | ICD-10-CM | POA: Diagnosis not present

## 2020-02-06 DIAGNOSIS — H35033 Hypertensive retinopathy, bilateral: Secondary | ICD-10-CM | POA: Diagnosis not present

## 2020-02-06 DIAGNOSIS — Z961 Presence of intraocular lens: Secondary | ICD-10-CM | POA: Diagnosis not present

## 2020-02-06 DIAGNOSIS — E119 Type 2 diabetes mellitus without complications: Secondary | ICD-10-CM | POA: Diagnosis not present

## 2020-02-06 DIAGNOSIS — H401131 Primary open-angle glaucoma, bilateral, mild stage: Secondary | ICD-10-CM | POA: Diagnosis not present

## 2020-02-06 DIAGNOSIS — H26493 Other secondary cataract, bilateral: Secondary | ICD-10-CM | POA: Diagnosis not present

## 2020-02-08 ENCOUNTER — Telehealth: Payer: Self-pay | Admitting: *Deleted

## 2020-02-08 DIAGNOSIS — R310 Gross hematuria: Secondary | ICD-10-CM

## 2020-02-08 NOTE — Telephone Encounter (Signed)
Patient called and stated that she cannot continue going to [facility] for her Urology Appointments.  Patient is requesting a referral to be placed to a Urologist in [place].  Please Advise.

## 2020-02-08 NOTE — Telephone Encounter (Signed)
Ok, referral to [facility] urology in [place] entered.

## 2020-02-08 NOTE — Telephone Encounter (Signed)
She stated that she did but they told her that they do not have a [place] office and cannot refer her.

## 2020-02-08 NOTE — Telephone Encounter (Signed)
At the appt we discussed that she can call [facility] and ask to be seen in [place] instead.

## 2020-02-08 NOTE — Telephone Encounter (Signed)
Patient notified and agreed.  

## 2020-03-04 ENCOUNTER — Encounter: Payer: Self-pay | Admitting: Internal Medicine

## 2020-03-11 ENCOUNTER — Other Ambulatory Visit: Payer: Self-pay | Admitting: Internal Medicine

## 2020-03-11 DIAGNOSIS — E1169 Type 2 diabetes mellitus with other specified complication: Secondary | ICD-10-CM

## 2020-03-13 ENCOUNTER — Other Ambulatory Visit: Payer: Self-pay | Admitting: Internal Medicine

## 2020-03-13 DIAGNOSIS — E1142 Type 2 diabetes mellitus with diabetic polyneuropathy: Secondary | ICD-10-CM

## 2020-03-22 ENCOUNTER — Encounter (HOSPITAL_COMMUNITY): Payer: Self-pay | Admitting: Emergency Medicine

## 2020-03-22 ENCOUNTER — Emergency Department (HOSPITAL_COMMUNITY): Payer: Medicare Other

## 2020-03-22 ENCOUNTER — Ambulatory Visit (HOSPITAL_COMMUNITY)
Admission: EM | Admit: 2020-03-22 | Discharge: 2020-03-22 | Disposition: A | Discharge status: Home / Self Care | Payer: Medicare Other | Attending: Student | Admitting: Student

## 2020-03-22 ENCOUNTER — Other Ambulatory Visit: Payer: Self-pay

## 2020-03-22 ENCOUNTER — Emergency Department (HOSPITAL_COMMUNITY)
Admission: EM | Admit: 2020-03-22 | Discharge: 2020-03-22 | Disposition: A | Discharge status: Home / Self Care | Payer: Medicare Other | Attending: Emergency Medicine | Admitting: Emergency Medicine

## 2020-03-22 DIAGNOSIS — R519 Headache, unspecified: Secondary | ICD-10-CM | POA: Diagnosis not present

## 2020-03-22 DIAGNOSIS — I10 Essential (primary) hypertension: Secondary | ICD-10-CM | POA: Diagnosis not present

## 2020-03-22 DIAGNOSIS — E119 Type 2 diabetes mellitus without complications: Secondary | ICD-10-CM

## 2020-03-22 DIAGNOSIS — Z7982 Long term (current) use of aspirin: Secondary | ICD-10-CM | POA: Insufficient documentation

## 2020-03-22 DIAGNOSIS — Z87891 Personal history of nicotine dependence: Secondary | ICD-10-CM | POA: Insufficient documentation

## 2020-03-22 DIAGNOSIS — Z79899 Other long term (current) drug therapy: Secondary | ICD-10-CM | POA: Insufficient documentation

## 2020-03-22 DIAGNOSIS — I161 Hypertensive emergency: Secondary | ICD-10-CM | POA: Diagnosis not present

## 2020-03-22 DIAGNOSIS — R079 Chest pain, unspecified: Secondary | ICD-10-CM | POA: Diagnosis not present

## 2020-03-22 DIAGNOSIS — E1149 Type 2 diabetes mellitus with other diabetic neurological complication: Secondary | ICD-10-CM | POA: Diagnosis not present

## 2020-03-22 DIAGNOSIS — Z96653 Presence of artificial knee joint, bilateral: Secondary | ICD-10-CM | POA: Insufficient documentation

## 2020-03-22 DIAGNOSIS — J9811 Atelectasis: Secondary | ICD-10-CM | POA: Diagnosis not present

## 2020-03-22 DIAGNOSIS — Z7984 Long term (current) use of oral hypoglycemic drugs: Secondary | ICD-10-CM | POA: Insufficient documentation

## 2020-03-22 DIAGNOSIS — R030 Elevated blood-pressure reading, without diagnosis of hypertension: Secondary | ICD-10-CM | POA: Diagnosis present

## 2020-03-22 DIAGNOSIS — J301 Allergic rhinitis due to pollen: Secondary | ICD-10-CM

## 2020-03-22 LAB — TROPONIN I (HIGH SENSITIVITY)
Troponin I (High Sensitivity): 4 ng/L (ref <18)
Troponin I (High Sensitivity): 5 ng/L (ref <18)

## 2020-03-22 LAB — CBC
HCT: 38.1 % (ref 36.0–46.0)
Hemoglobin: 12.2 g/dL (ref 12.0–15.0)
MCH: 28.7 pg (ref 26.0–34.0)
MCHC: 32 g/dL (ref 30.0–36.0)
MCV: 89.6 fL (ref 80.0–100.0)
Platelets: 322 10*3/uL (ref 150–400)
RBC: 4.25 MIL/uL (ref 3.87–5.11)
RDW: 13.2 % (ref 11.5–15.5)
WBC: 11.3 10*3/uL — ABNORMAL HIGH (ref 4.0–10.5)
nRBC: 0 % (ref 0.0–0.2)

## 2020-03-22 LAB — BASIC METABOLIC PANEL
Anion gap: 4 — ABNORMAL LOW (ref 5–15)
BUN: 13 mg/dL (ref 8–23)
CO2: 27 mmol/L (ref 22–32)
Calcium: 9.3 mg/dL (ref 8.9–10.3)
Chloride: 102 mmol/L (ref 98–111)
Creatinine, Ser: 0.98 mg/dL (ref 0.44–1.00)
GFR, Estimated: >60 mL/min (ref >60)
Glucose, Bld: 141 mg/dL — ABNORMAL HIGH (ref 70–99)
Potassium: 3.8 mmol/L (ref 3.5–5.1)
Sodium: 133 mmol/L — ABNORMAL LOW (ref 135–145)

## 2020-03-22 MED ORDER — METOCLOPRAMIDE HCL 5 MG/ML IJ SOLN
10.0000 mg | Freq: Once | INTRAMUSCULAR | Status: AC
  Administered 2020-03-22: 10 mg via INTRAVENOUS
  Filled 2020-03-22: qty 2

## 2020-03-22 MED ORDER — HYDRALAZINE HCL 20 MG/ML IJ SOLN
10.0000 mg | Freq: Once | INTRAMUSCULAR | Status: AC
  Administered 2020-03-22: 10 mg via INTRAVENOUS
  Filled 2020-03-22: qty 1

## 2020-03-22 MED ORDER — DIPHENHYDRAMINE HCL 50 MG/ML IJ SOLN
25.0000 mg | Freq: Once | INTRAMUSCULAR | Status: AC
  Administered 2020-03-22: 25 mg via INTRAVENOUS
  Filled 2020-03-22: qty 1

## 2020-03-22 NOTE — Discharge Instructions (Addendum)
-  Head straight to [facility], ER for further evaluation of your headache and elevated blood pressure. -If you experience dizziness, chest pain, shortness of breath, worsening of headache, vision changes, loss of consciousness while on the way to the ER-stop and call 911 immediately.

## 2020-03-22 NOTE — ED Triage Notes (Signed)
Patient sent to ED for evaluation of hypertension despite taking antihypertensive medications today. BP in triage 209/112. Patient complains of headache, no neuro deficits. Patient alert, oriented, and in no apparent distress at this time.

## 2020-03-22 NOTE — ED Notes (Signed)
Pt d/c by md and is provided w/ d/c instructions and follow up care, Pt out of ED ambulatory with family

## 2020-03-22 NOTE — ED Notes (Signed)
Patient transported to CT 

## 2020-03-22 NOTE — ED Provider Notes (Signed)
[facility] [facility] EMERGENCY DEPARTMENT Provider Note   CSN: [id number] Arrival date & time: 03/22/20  1744     History Chief Complaint  Patient presents with  . Hypertension    Asma Alison [name] is a 75 y.o. female hx of DM, HTN, HL, here presenting with hypertension and headaches.  Patient states that she had headaches since yesterday.  She denies any trouble speaking.  She went to urgent care was noted to have a blood pressure over 200.  She is sent here for further evaluation.  Patient states that she takes her lisinopril every morning has been compliant with that.  Denies any chest pain or shortness of breath  The history is provided by the patient.       Past Medical History:  Diagnosis Date  . Anxiety   . Benign essential hypertension   . Blood transfusion without reported diagnosis   . Cataract    removed bilateraly  . Diabetes mellitus without complication ([place])   . Diverticulitis of colon (without mention of hemorrhage)(562.11)   . GERD (gastroesophageal reflux disease)   . Goiter, specified as simple   . Helicobacter pylori gastritis   . Hyperlipidemia LDL goal < 100   . Intrahepatic bile duct dilation   . Numbness    TOES RT FOOT and toes left foot  . Obesity, unspecified   . Osteoarthrosis, unspecified whether generalized or localized, lower leg   . Osteopenia   . Other specified disease of nail    FUNGUS OF FINGERNAILS  . Pernicious anemia   . Unspecified glaucoma(365.9)   . Unspecified vitamin D deficiency   . Wears dentures   . Wears glasses     Patient Active Problem List   Diagnosis Date Noted  . Calculus of bile duct without cholangitis with obstruction   . Renal calcification 03/10/2019  . Transaminitis 02/04/2019  . Abnormal ultrasound of liver 02/04/2019  . Sessile rectal polyp 05/02/2018  . Internal hemorrhoids without complication 81/01/7508  . Diverticulosis 05/02/2018  . 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 Chronic pain of both knees 08/19/2017  . Cataracts, bilateral 07/08/2015  . Hyperlipidemia associated with type 2 diabetes mellitus ([place]) 04/27/2015  . History of total knee arthroplasty 04/18/2014  . History of lumbar laminectomy for spinal cord decompression 05/04/2013  . Synovial cyst of lumbar spine 03/31/2013  . Spinal stenosis of lumbar region with neurogenic claudication 03/30/2013  . Synovial cyst of lumbar facet joint 03/30/2013  . Type 2 diabetes mellitus with neurological complications ([place]) 25/85/2778  . Osteoarthritis of right knee 02/01/2013  . History of total knee replacement 02/01/2013  . Hyperglycemia 10/03/2012  . Unspecified vitamin D deficiency   . Pernicious anemia   . Glaucoma (increased eye pressure)   . Benign essential hypertension   . Osteopenia     Past Surgical History:  Procedure Laterality Date  . BACK SURGERY    . CATARACT EXTRACTION     right eye 06/20/15 left eye 09/12/15  . COLECTOMY  2009  . COLONOSCOPY  09/06/2007   Dr Carol Ada  . DILATION AND CURETTAGE OF UTERUS    . ENDOSCOPIC RETROGRADE CHOLANGIOPANCREATOGRAPHY (ERCP) WITH PROPOFOL N/A 03/17/2019   Procedure: ENDOSCOPIC RETROGRADE CHOLANGIOPANCREATOGRAPHY (ERCP) WITH PROPOFOL;  Surgeon: Ladene Artist, MD;  Location: [facility] ENDOSCOPY;  Service: Endoscopy;  Laterality: N/A;  . JOINT REPLACEMENT     right  . LUMBAR LAMINECTOMY/DECOMPRESSION MICRODISCECTOMY N/A 03/30/2013   Procedure: CENTRAL DECOMPRESSION L4 - L5 AND EXCISION OF  SYNOVIAL CYST ON THE LEFT;  Surgeon: Tobi Bastos, MD;  Location: [facility] ORS;  Service: Orthopedics;  Laterality: N/A;  . MULTIPLE TOOTH EXTRACTIONS    . REMOVAL OF STONES  03/17/2019   Procedure: REMOVAL OF STONES;  Surgeon: Ladene Artist, MD;  Location: [facility] ENDOSCOPY;  Service: Endoscopy;;  . SIGMOIDOSCOPY  02/16/2018   Dr Fuller Plan  . SPHINCTEROTOMY  03/17/2019   Procedure: SPHINCTEROTOMY;  Surgeon: Ladene Artist, MD;  Location: [facility];  Service: Endoscopy;;   . TOTAL KNEE ARTHROPLASTY Right 02/01/2013   Procedure: RIGHT TOTAL KNEE ARTHROPLASTY;  Surgeon: Tobi Bastos, MD;  Location: [facility] ORS;  Service: Orthopedics;  Laterality: Right;  . TOTAL KNEE ARTHROPLASTY Left 04/18/2014   Procedure: LEFT TOTAL KNEE ARTHROPLASTY;  Surgeon: Latanya Maudlin, MD;  Location: [facility] ORS;  Service: Orthopedics;  Laterality: Left;     OB History   No obstetric history on file.     Family History  Problem Relation Age of Onset  . Alzheimer's disease Mother   . Stroke Mother   . Hyperlipidemia Mother   . Heart disease Father   . Diabetes Sister   . Colon polyps Neg Hx   . Colon cancer Neg Hx   . Esophageal cancer Neg Hx   . Rectal cancer Neg Hx   . Stomach cancer Neg Hx     Social History   Tobacco Use  . Smoking status: Former Smoker    Packs/day: 0.50    Years: 30.00    Pack years: 15.00    Types: Cigarettes    Quit date: 03/2019    Years since quitting: 1.0  . Smokeless tobacco: Never Used  Vaping Use  . Vaping Use: Never used  Substance Use Topics  . Alcohol use: No  . Drug use: No    Home Medications Prior to Admission medications   Medication Sig Start Date End Date Taking? Authorizing Provider  acetaminophen (TYLENOL) 650 MG CR tablet Take 1,300 mg by mouth in the morning and at bedtime.    [provider]  aspirin EC 81 MG tablet Take 81 mg by mouth daily.    [provider]  atorvastatin (LIPITOR) 40 MG tablet TAKE 1 TABLET BY MOUTH ONCE DAILY AT  [facility] 03/11/20   Reed, Tiffany L, DO  Calcium Carb-Cholecalciferol (CALCIUM 600 + D PO) Take 1 tablet by mouth daily.    [provider]  carboxymethylcellulose (REFRESH PLUS) 0.5 % SOLN Place 1 drop into both eyes 3 (three) times daily as needed (dry eyes).     [provider]  Cholecalciferol (VITAMIN D-3) 1000 UNITS CAPS Take 1,000 Units by mouth daily.     [provider]  COMBIGAN 0.2-0.5 % ophthalmic solution Place 1 drop into both eyes in the  morning and at bedtime.  12/04/15   [provider]  gabapentin (NEURONTIN) 300 MG capsule Take 1 capsule by mouth at bedtime 02/01/20   Reed, Tiffany L, DO  Homeopathic Products ([place]) Apply 1 application topically daily as needed (pain).    [provider]  latanoprost (XALATAN) 0.005 % ophthalmic solution Place 1 drop into both eyes at bedtime.    [provider]  lisinopril (ZESTRIL) 10 MG tablet Take 1 tablet by mouth once daily 12/18/19   Reed, Tiffany L, DO  meloxicam (MOBIC) 15 MG tablet Take 15 mg by mouth daily.     [provider]  Menthol, Topical Analgesic, (ICY HOT EX) Apply 1 application topically  daily as needed (pain).    [provider]  metFORMIN (GLUCOPHAGE) 500 MG tablet TAKE 1/2 (ONE-HALF) TABLET BY MOUTH IN THE EVENING WITH SUPPER 03/13/20   Reed, Tiffany L, DO  Multiple Vitamin (MULTIVITAMIN) tablet Take 1 tablet by mouth daily.    [provider]  Omega-3 Fatty Acids (OMEGA 3 PO) Take 1,200 mg by mouth daily.    [provider]  pantoprazole (PROTONIX) 40 MG tablet Take 1 tablet (40 mg total) by mouth daily. 08/18/19   Ladene Artist, MD  trolamine salicylate (ASPERCREME) 10 % cream Apply 1 application topically as needed for muscle pain.    [provider]    Allergies    Aspirin  Review of Systems   Review of Systems  Neurological: Positive for headaches.  All other systems reviewed and are negative.   Physical Exam Updated Vital Signs BP (!) 138/58   Pulse (!) 105   Temp 99 F (37.2 C)   Resp (!) 30   SpO2 99%   Physical Exam Vitals and nursing note reviewed.  Constitutional:      Comments: Slightly uncomfortable  HENT:     Head: Normocephalic.     Nose: Nose normal.     Mouth/Throat:     Mouth: Mucous membranes are moist.  Eyes:     Extraocular Movements: Extraocular movements intact.     Pupils: Pupils are equal, round, and reactive to light.  Cardiovascular:      Rate and Rhythm: Normal rate and regular rhythm.     Pulses: Normal pulses.     Heart sounds: Normal heart sounds.  Pulmonary:     Effort: Pulmonary effort is normal.     Breath sounds: Normal breath sounds.  Abdominal:     General: Abdomen is flat.     Palpations: Abdomen is soft.  Musculoskeletal:        General: Normal range of motion.     Cervical back: Normal range of motion and neck supple.  Skin:    General: Skin is warm.     Capillary Refill: Capillary refill takes less than 2 seconds.  Neurological:     General: No focal deficit present.     Mental Status: She is oriented to person, place, and time.     Comments: Cranial nerves II to XII intact and normal strength and sensation bilaterally.  Normal finger-to-nose bilateral  Psychiatric:        Mood and Affect: Mood normal.        Behavior: Behavior normal.     ED Results / Procedures / Treatments   Labs (all labs ordered are listed, but only abnormal results are displayed) Labs Reviewed  BASIC METABOLIC PANEL - Abnormal; Notable for the following components:      Result Value   Sodium 133 (*)    Glucose, Bld 141 (*)    Anion gap 4 (*)    All other components within normal limits  CBC - Abnormal; Notable for the following components:   WBC 11.3 (*)    All other components within normal limits  TROPONIN I (HIGH SENSITIVITY)  TROPONIN I (HIGH SENSITIVITY)    EKG EKG Interpretation  Date/Time:  Friday March 22 2020 17:52:40 EST Ventricular Rate:  89 PR Interval:  176 QRS Duration: 74 QT Interval:  360 QTC Calculation: 438 R Axis:   20 Text Interpretation: Normal sinus rhythm Nonspecific ST abnormality Abnormal ECG No significant change since last tracing Confirmed by Wandra Arthurs [phone])  on 03/22/2020 6:56:47 PM   Radiology DG Chest 2 View  Result Date: 03/22/2020 CLINICAL DATA:  Chest pain.  Hypertension. EXAM: CHEST - 2 VIEW COMPARISON:  04/11/2014 FINDINGS: The cardiomediastinal contours are normal.  Atherosclerosis of the thoracic aorta. Mild peribronchial thickening. Subsegmental atelectasis in the right upper lobe. Pulmonary vasculature is normal. No consolidation, pleural effusion, or pneumothorax. No acute osseous abnormalities are seen. IMPRESSION: 1. Mild peribronchial thickening may be related to bronchitis or history of smoking. 2. Subsegmental atelectasis in the right upper lobe. Electronically Signed   By: Keith Rake M.D.   On: 03/22/2020 18:44   CT Head Wo Contrast  Result Date: 03/22/2020 CLINICAL DATA:  Headache, hypertension EXAM: CT HEAD WITHOUT CONTRAST TECHNIQUE: Contiguous axial images were obtained from the base of the skull through the vertex without intravenous contrast. COMPARISON:  None. FINDINGS: Brain: No evidence of acute infarction, hemorrhage, hydrocephalus, extra-axial collection or mass lesion/mass effect. Periventricular and deep white matter hypodensity. Vascular: No hyperdense vessel or unexpected calcification. Skull: Normal. Negative for fracture or focal lesion. Sinuses/Orbits: No acute finding. Other: None. IMPRESSION: No acute intracranial pathology. Small-vessel white matter disease. Electronically Signed   By: Eddie Candle M.D.   On: 03/22/2020 19:38    Procedures Procedures   Medications Ordered in ED Medications  metoCLOPramide (REGLAN) injection 10 mg (10 mg Intravenous Given 03/22/20 2019)  diphenhydrAMINE (BENADRYL) injection 25 mg (25 mg Intravenous Given 03/22/20 2023)  hydrALAZINE (APRESOLINE) injection 10 mg (10 mg Intravenous Given 03/22/20 2022)    ED Course  I have reviewed the triage vital signs and the nursing notes.  Pertinent labs & imaging results that were available during my care of the patient were reviewed by me and considered in my medical decision making (see chart for details).    MDM Rules/Calculators/A&P                         Alison [name] is a 76 y.o. female here with hypertension, headache.  Likely symptomatic  hypertension.  Will get CT head to rule out bleed.  Also give BP meds.  Will check basic labs as well.   9:52 PM Labs unremarkable.  CT head unremarkable.  Blood pressure came down to 160- 170s after migraine cocktail and some hydralazine.  We will increase her lisinopril to 20 mg daily.  We will have her follow-up with PCP.   Final Clinical Impression(s) / ED Diagnoses Final diagnoses:  None    Rx / DC Orders ED Discharge Orders    None       Drenda Freeze, MD 03/22/20 2153

## 2020-03-22 NOTE — Discharge Instructions (Signed)
Please double up with your lisinopril and take 20 mg daily.  Recheck your blood pressure with your doctor in a week.  You may need additional medicines if it is not controlled  Return to ER if you have worse headaches, vomiting, chest pain, trouble speaking.

## 2020-03-22 NOTE — ED Triage Notes (Signed)
Pt presents with head congestion, headache behind eyes, and high blood pressure. States head congestion started 3-4 days ago.   States has been taking BP medication like prescribed.  Charge RN notified of pts BP.

## 2020-03-22 NOTE — ED Provider Notes (Addendum)
[name]    CSN: [id number] Arrival date & time: 03/22/20  1647      History   Chief Complaint Chief Complaint  Patient presents with  . Headache  . Nasal Congestion    HPI Alison [name] is a 75 y.o. female presenting with hypertensive emergency.  History of cataracts status post removal, glaucoma, hypertension, diabetes, hyperlipidemia.  Patient notes 7/10 pressure behind eyes for 3 days with mild head congestion that she attributes to allergies.  States that she also checked her blood pressure today for the first time in many months, and was astounded at how high it was. States she took her antihypertensives this morning (8 hours ago). Denies vision changes, chest pain, shortness of breath, dizziness, worst headache of life, loss of consciousness, nausea/vomiting/diarrhea, cough, fever/chills, unusual weakness, weakness or sensation changes in 1 arm or 1 leg.  Patient states she is ambulating using wheelchair because she forgot her cane today.  HPI  Past Medical History:  Diagnosis Date  . Anxiety   . Benign essential hypertension   . Blood transfusion without reported diagnosis   . Cataract    removed bilateraly  . Diabetes mellitus without complication ([place])   . Diverticulitis of colon (without mention of hemorrhage)(562.11)   . GERD (gastroesophageal reflux disease)   . Goiter, specified as simple   . Helicobacter pylori gastritis   . Hyperlipidemia LDL goal < 100   . Intrahepatic bile duct dilation   . Numbness    TOES RT FOOT and toes left foot  . Obesity, unspecified   . Osteoarthrosis, unspecified whether generalized or localized, lower leg   . Osteopenia   . Other specified disease of nail    FUNGUS OF FINGERNAILS  . Pernicious anemia   . Unspecified glaucoma(365.9)   . Unspecified vitamin D deficiency   . Wears dentures   . Wears glasses     Patient Active Problem List   Diagnosis Date Noted  . Calculus of bile duct without cholangitis  with obstruction   . Renal calcification 03/10/2019  . Transaminitis 02/04/2019  . Abnormal ultrasound of liver 02/04/2019  . Sessile rectal polyp 05/02/2018  . Internal hemorrhoids without complication 55/73/2202  . Diverticulosis 05/02/2018  . 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 Chronic pain of both knees 08/19/2017  . Cataracts, bilateral 07/08/2015  . Hyperlipidemia associated with type 2 diabetes mellitus ([place]) 04/27/2015  . History of total knee arthroplasty 04/18/2014  . History of lumbar laminectomy for spinal cord decompression 05/04/2013  . Synovial cyst of lumbar spine 03/31/2013  . Spinal stenosis of lumbar region with neurogenic claudication 03/30/2013  . Synovial cyst of lumbar facet joint 03/30/2013  . Type 2 diabetes mellitus with neurological complications ([place]) 54/27/0623  . Osteoarthritis of right knee 02/01/2013  . History of total knee replacement 02/01/2013  . Hyperglycemia 10/03/2012  . Unspecified vitamin D deficiency   . Pernicious anemia   . Glaucoma (increased eye pressure)   . Benign essential hypertension   . Osteopenia     Past Surgical History:  Procedure Laterality Date  . BACK SURGERY    . CATARACT EXTRACTION     right eye 06/20/15 left eye 09/12/15  . COLECTOMY  2009  . COLONOSCOPY  09/06/2007   Dr Carol Ada  . DILATION AND CURETTAGE OF UTERUS    . ENDOSCOPIC RETROGRADE CHOLANGIOPANCREATOGRAPHY (ERCP) WITH PROPOFOL N/A 03/17/2019   Procedure: ENDOSCOPIC RETROGRADE CHOLANGIOPANCREATOGRAPHY (ERCP) WITH PROPOFOL;  Surgeon: Ladene Artist, MD;  Location: [facility] END[facility];  Service: Endoscopy;  Laterality: N/A;  . JOINT REPLACEMENT     right  . LUMBAR LAMINECTOMY/DECOMPRESSION MICRODISCECTOMY N/A 03/30/2013   Procedure: CENTRAL DECOMPRESSION L4 - L5 AND EXCISION 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;  Surgeon: Tobi Bastos, MD;  Location: [facility];  Service: Orthopedics;  Laterality: N/A;  . MULTIPLE TOOTH EXTRACTIONS    . REMOVAL OF  STONES  03/17/2019   Procedure: REMOVAL OF STONES;  Surgeon: Ladene Artist, MD;  Location: [facility] ENDOSCOPY;  Service: Endoscopy;;  . SIGMOIDOSCOPY  02/16/2018   Dr Fuller Plan  . SPHINCTEROTOMY  03/17/2019   Procedure: SPHINCTEROTOMY;  Surgeon: Ladene Artist, MD;  Location: [name];  Service: Endoscopy;;  . TOTAL KNEE ARTHROPLASTY Right 02/01/2013   Procedure: RIGHT TOTAL KNEE ARTHROPLASTY;  Surgeon: Tobi Bastos, MD;  Location: [facility];  Service: Orthopedics;  Laterality: Right;  . TOTAL KNEE ARTHROPLASTY Left 04/18/2014   Procedure: LEFT TOTAL KNEE ARTHROPLASTY;  Surgeon: Latanya Maudlin, MD;  Location: [facility];  Service: Orthopedics;  Laterality: Left;    OB History   No obstetric history on file.      Home Medications    Prior to Admission medications   Medication Sig Start Date End Date Taking? Authorizing Provider  acetaminophen (TYLENOL) 650 MG CR tablet Take 1,300 mg by mouth in the morning and at bedtime.    [provider]  aspirin EC 81 MG tablet Take 81 mg by mouth daily.    [provider]  atorvastatin (LIPITOR) 40 MG tablet TAKE 1 TABLET BY MOUTH ONCE DAILY AT  [facility] 03/11/20   Reed, Tiffany L, DO  Calcium Carb-Cholecalciferol (CALCIUM 600 + D PO) Take 1 tablet by mouth daily.    [provider]  carboxymethylcellulose (REFRESH PLUS) 0.5 % SOLN Place 1 drop into both eyes 3 (three) times daily as needed (dry eyes).     [provider]  Cholecalciferol (VITAMIN D-3) 1000 UNITS CAPS Take 1,000 Units by mouth daily.     [provider]  COMBIGAN 0.2-0.5 % ophthalmic solution Place 1 drop into both eyes in the morning and at bedtime.  12/04/15   [provider]  gabapentin (NEURONTIN) 300 MG capsule Take 1 capsule by mouth at bedtime 02/01/20   Reed, Tiffany L, DO  Homeopathic Products ([place]) Apply 1 application topically daily as needed (pain).    [provider]  latanoprost (XALATAN) 0.005 % ophthalmic  solution Place 1 drop into both eyes at bedtime.    [provider]  lisinopril (ZESTRIL) 10 MG tablet Take 1 tablet by mouth once daily 12/18/19   Reed, Tiffany L, DO  meloxicam (MOBIC) 15 MG tablet Take 15 mg by mouth daily.     [provider]  Menthol, Topical Analgesic, (ICY HOT EX) Apply 1 application topically daily as needed (pain).    [provider]  metFORMIN (GLUCOPHAGE) 500 MG tablet TAKE 1/2 (ONE-HALF) TABLET BY MOUTH IN THE EVENING WITH SUPPER 03/13/20   Reed, Tiffany L, DO  Multiple Vitamin (MULTIVITAMIN) tablet Take 1 tablet by mouth daily.    [provider]  Omega-3 Fatty Acids (OMEGA 3 PO) Take 1,200 mg by mouth daily.    [provider]  pantoprazole (PROTONIX) 40 MG tablet Take 1 tablet (40 mg total) by mouth daily. 08/18/19   Ladene Artist, MD  trolamine salicylate (ASPERCREME) 10 % cream Apply 1 application topically as needed for muscle pain.    [provider]    Family History Family History  Problem Relation Age of Onset  . Alzheimer's disease Mother   . Stroke Mother   . Hyperlipidemia Mother   . Heart disease Father   . Diabetes Sister   . Colon polyps Neg Hx   . Colon cancer Neg Hx   . Esophageal cancer Neg Hx   . Rectal cancer Neg Hx   . Stomach cancer Neg Hx     Social History Social History   Tobacco Use  . Smoking status: Former Smoker    Packs/day: 0.50    Years: 30.00    Pack years: 15.00    Types: Cigarettes    Quit date: 03/2019    Years since quitting: 1.0  . Smokeless tobacco: Never Used  Vaping Use  . Vaping Use: Never used  Substance Use Topics  . Alcohol use: No  . Drug use: No     Allergies   Aspirin   Review of Systems Review of Systems  Constitutional: Negative for appetite change, chills and fever.  HENT: Positive for congestion. Negative for ear pain, rhinorrhea, sinus pressure, sinus pain and sore throat.   Eyes: Negative for redness and visual disturbance.   Respiratory: Negative for cough, chest tightness, shortness of breath and wheezing.   Cardiovascular: Negative for chest pain and palpitations.  Gastrointestinal: Negative for abdominal pain, constipation, diarrhea, nausea and vomiting.  Genitourinary: Negative for dysuria, frequency and urgency.  Musculoskeletal: Negative for myalgias.  Neurological: Positive for headaches. Negative for dizziness, tremors, seizures, syncope, facial asymmetry, speech difficulty, weakness, light-headedness and numbness.  Psychiatric/Behavioral: Negative for confusion.  All other systems reviewed and are negative.    Physical Exam Triage Vital Signs ED Triage Vitals  Enc Vitals Group     BP      Pulse      Resp      Temp      Temp src      SpO2      Weight      Height      Head Circumference      Peak Flow      Pain Score      Pain Loc      Pain Edu?      Excl. in [place]?    No data found.  Updated Vital Signs BP (!) 230/101 (BP Location: Right Arm)   Pulse 88   Temp 98.2 F (36.8 C) (Oral)   Resp 18   SpO2 100%   Visual Acuity Right Eye Distance: 20/40 (with glasses) Left Eye Distance: 20/40 (with glasses) Bilateral Distance: 20/40 (with glasses)  Right Eye Near:   Left Eye Near:    Bilateral Near:     Physical Exam Vitals reviewed.  Constitutional:      General: She is not in acute distress.    Appearance: Normal appearance. She is not ill-appearing.  HENT:     Head: Normocephalic and atraumatic.     Right Ear: Hearing, tympanic membrane, ear canal and external ear normal. No swelling or tenderness. There is no impacted cerumen. No mastoid tenderness. Tympanic membrane is not perforated, erythematous, retracted or bulging.     Left Ear: Hearing, tympanic membrane, ear canal and external ear normal. No swelling or tenderness. There is no impacted cerumen. No mastoid tenderness. Tympanic membrane is not perforated, erythematous, retracted or bulging.     Nose:     Right Sinus:  No maxillary sinus tenderness or frontal sinus tenderness.  Left Sinus: No maxillary sinus tenderness or frontal sinus tenderness.     Mouth/Throat:     Mouth: Mucous membranes are moist.     Pharynx: Uvula midline. No oropharyngeal exudate or posterior oropharyngeal erythema.     Tonsils: No tonsillar exudate.  Eyes:     Extraocular Movements: Extraocular movements intact.     Pupils: Pupils are equal, round, and reactive to light.  Cardiovascular:     Rate and Rhythm: Normal rate and regular rhythm.     Pulses:          Radial pulses are 2+ on the right side and 2+ on the left side.     Heart sounds: Normal heart sounds.  Pulmonary:     Effort: Pulmonary effort is normal.     Breath sounds: Normal breath sounds and air entry. No wheezing, rhonchi or rales.  Chest:     Chest wall: No tenderness.  Abdominal:     General: Abdomen is flat. Bowel sounds are normal.     Tenderness: There is no abdominal tenderness. There is no guarding or rebound.  Musculoskeletal:     Cervical back: Normal range of motion and neck supple. No rigidity.     Right lower leg: No edema.     Left lower leg: No edema.  Lymphadenopathy:     Cervical: No cervical adenopathy.  Neurological:     General: No focal deficit present.     Mental Status: She is alert and oriented to person, place, and time. Mental status is at baseline.     Cranial Nerves: Cranial nerves are intact. No cranial nerve deficit or facial asymmetry.     Sensory: Sensation is intact. No sensory deficit.     Motor: Motor function is intact. No weakness.     Coordination: Coordination is intact. Coordination normal.     Gait: Gait is intact. Gait normal.     Comments: CN 2-12 intact. No weakness or numbness in UEs or LEs. Strength 5/5 in UEs and LEs.  Psychiatric:        Attention and Perception: Attention and perception normal.        Mood and Affect: Mood and affect normal.        Behavior: Behavior normal. Behavior is cooperative.         Thought Content: Thought content normal.        Judgment: Judgment normal.      UC Treatments / Results  Labs (all labs ordered are listed, but only abnormal results are displayed) Labs Reviewed - No data to display  EKG   Radiology No results found.  Procedures Procedures (including critical care time)  Medications Ordered in UC Medications - No data to display  Initial Impression / Assessment and Plan / UC Course  I have reviewed the triage vital signs and the nursing notes.  Pertinent labs & imaging results that were available during my care of the patient were reviewed by me and considered in my medical decision making (see chart for details).     This patient is a 75 year old female presenting with significantly elevated blood pressure of 230/101 and headaches x3 days. Also with mild congestion related to allergic rhinitis.   Risk factors include uncontrolled hypertension, diabetes, hyperlipidemia, former smoker, and obesity.  Benign neuro exam today. Vision intact. Wears glasses.  I am sending this patient to [facility] ED for further evaluation and management of hypertensive emergency. Discussed option of transport via EMS which they decline in  favor of transport in personal vehicle by family member. She is currently hemodynamically stable for this.  This chart was dictated using voice recognition software, Dragon. Despite the best efforts of this provider to proofread and correct errors, errors may still occur which can change documentation meaning.   Final Clinical Impressions(s) / UC Diagnoses   Final diagnoses:  Hypertensive emergency  Essential hypertension  Seasonal allergic rhinitis due to pollen     Discharge Instructions     -Head straight to [facility] ER for further evaluation of your headache and elevated blood pressure. -If you experience dizziness, chest pain, shortness of breath, worsening of headache, vision changes, loss of  consciousness while on the way to the ER-stop and call 911 immediately.    ED Prescriptions    None     PDMP not reviewed this encounter.   Hazel Sams, PA-C 03/22/20 1741    Hazel Sams, PA-C 03/26/20 [phone]

## 2020-03-25 ENCOUNTER — Ambulatory Visit (INDEPENDENT_AMBULATORY_CARE_PROVIDER_SITE_OTHER): Payer: Medicare Other | Admitting: Family

## 2020-03-25 ENCOUNTER — Encounter: Payer: Self-pay | Admitting: Family

## 2020-03-25 ENCOUNTER — Other Ambulatory Visit: Payer: Self-pay

## 2020-03-25 VITALS — BP 142/90 | HR 88 | Temp 96.2°F | Resp 18 | Ht 66.0 in | Wt 210.4 lb

## 2020-03-25 DIAGNOSIS — I10 Essential (primary) hypertension: Secondary | ICD-10-CM | POA: Diagnosis not present

## 2020-03-25 DIAGNOSIS — E871 Hypo-osmolality and hyponatremia: Secondary | ICD-10-CM | POA: Diagnosis not present

## 2020-03-25 DIAGNOSIS — D72829 Elevated white blood cell count, unspecified: Secondary | ICD-10-CM | POA: Diagnosis not present

## 2020-03-25 DIAGNOSIS — H6122 Impacted cerumen, left ear: Secondary | ICD-10-CM

## 2020-03-25 MED ORDER — AMLODIPINE BESYLATE 5 MG PO TABS: 5.0000 mg | ORAL_TABLET | Freq: Every day | ORAL | 3 refills | Status: DC

## 2020-03-25 MED ORDER — DEBROX 6.5 % OT SOLN: 5.0000 [drp] | Freq: Two times a day (BID) | OTIC | 0 refills | Status: DC

## 2020-03-25 NOTE — Patient Instructions (Addendum)
Instill  Debrox 6.5 % otic solution into left ear 5 drops twice daily for 4 days then follow up for ear lavage.    [email or web address]">  DASH Eating Plan DASH stands for Dietary Approaches to Stop Hypertension. The DASH eating plan is a healthy eating plan that has been shown to:  Reduce high blood pressure (hypertension).  Reduce your risk for type 2 diabetes, heart disease, and stroke.  Help with weight loss. What are tips for following this plan? Reading food labels  Check food labels for the amount of salt (sodium) per serving. Choose foods with less than 5 percent of the Daily Value of sodium. Generally, foods with less than 300 milligrams (mg) of sodium per serving fit into this eating plan.  To find whole grains, look for the word "whole" as the first word in the ingredient list. Shopping  Buy products labeled as "low-sodium" or "no salt added."  Buy fresh foods. Avoid canned foods and pre-made or frozen meals. Cooking  Avoid adding salt when cooking. Use salt-free seasonings or herbs instead of table salt or sea salt. Check with your health care provider or pharmacist before using salt substitutes.  Do not fry foods. Cook foods using healthy methods such as baking, boiling, grilling, roasting, and broiling instead.  Cook with heart-healthy oils, such as olive, canola, avocado, soybean, or sunflower oil. Meal planning  Eat a balanced diet that includes: ? 4 or more servings of fruits and 4 or more servings of vegetables each day. Try to fill one-half of your plate with fruits and vegetables. ? 6-8 servings of whole grains each day. ? Less than 6 oz (170 g) of lean meat, poultry, or fish each day. A 3-oz (85-g) serving of meat is about the same size as a deck of cards. One egg equals 1 oz (28 g). ? 2-3 servings of low-fat dairy each day. One serving is 1 cup (237 mL). ? 1 serving of nuts, seeds, or beans 5 times each  week. ? 2-3 servings of heart-healthy fats. Healthy fats called omega-3 fatty acids are found in foods such as walnuts, flaxseeds, fortified milks, and eggs. These fats are also found in cold-water fish, such as sardines, salmon, and mackerel.  Limit how much you eat of: ? Canned or prepackaged foods. ? Food that is high in trans fat, such as some fried foods. ? Food that is high in saturated fat, such as fatty meat. ? Desserts and other sweets, sugary drinks, and other foods with added sugar. ? Full-fat dairy products.  Do not salt foods before eating.  Do not eat more than 4 egg yolks a week.  Try to eat at least 2 vegetarian meals a week.  Eat more home-cooked food and less restaurant, buffet, and fast food.   Lifestyle  When eating at a restaurant, ask that your food be prepared with less salt or no salt, if possible.  If you drink alcohol: ? Limit how much you use to:  0-1 drink a day for women who are not pregnant.  0-2 drinks a day for men. ? Be aware of how much alcohol is in your drink. In the [place] one drink equals one 12 oz bottle of beer (355 mL), one 5 oz glass of wine (148 mL), or one 1 oz glass of hard liquor (44 mL). General information  Avoid eating more than 2,300 mg of salt a day. If you have hypertension, you may need to reduce your sodium intake to 1,500 mg  a day.  Work with your health care provider to maintain a healthy body weight or to lose weight. Ask what an ideal weight is for you.  Get at least 30 minutes of exercise that causes your heart to beat faster (aerobic exercise) most days of the week. Activities may include walking, swimming, or biking.  Work with your health care provider or dietitian to adjust your eating plan to your individual calorie needs. What foods should I eat? Fruits All fresh, dried, or frozen fruit. Canned fruit in natural juice (without added sugar). Vegetables Fresh or frozen vegetables (raw, steamed, roasted, or  grilled). Low-sodium or reduced-sodium tomato and vegetable juice. Low-sodium or reduced-sodium tomato sauce and tomato paste. Low-sodium or reduced-sodium canned vegetables. Grains Whole-grain or whole-wheat bread. Whole-grain or whole-wheat pasta. Brown rice. [place]. Bulgur. Whole-grain and low-sodium cereals. Pita bread. Low-fat, low-sodium crackers. Whole-wheat flour tortillas. Meats and other proteins Skinless chicken or [place]. Ground chicken or [place]. Pork with fat trimmed off. Fish and seafood. Egg whites. Dried beans, peas, or lentils. Unsalted nuts, nut butters, and seeds. Unsalted canned beans. Lean cuts of beef with fat trimmed off. Low-sodium, lean precooked or cured meat, such as sausages or meat loaves. Dairy Low-fat (1%) or fat-free (skim) milk. Reduced-fat, low-fat, or fat-free cheeses. Nonfat, low-sodium ricotta or cottage cheese. Low-fat or nonfat yogurt. Low-fat, low-sodium cheese. Fats and oils Soft margarine without trans fats. Vegetable oil. Reduced-fat, low-fat, or light mayonnaise and salad dressings (reduced-sodium). Canola, safflower, olive, avocado, soybean, and sunflower oils. Avocado. Seasonings and condiments Herbs. Spices. Seasoning mixes without salt. Other foods Unsalted popcorn and pretzels. Fat-free sweets. The items listed above may not be a complete list of foods and beverages you can eat. Contact a dietitian for more information. What foods should I avoid? Fruits Canned fruit in a light or heavy syrup. Fried fruit. Fruit in cream or butter sauce. Vegetables Creamed or fried vegetables. Vegetables in a cheese sauce. Regular canned vegetables (not low-sodium or reduced-sodium). Regular canned tomato sauce and paste (not low-sodium or reduced-sodium). Regular tomato and vegetable juice (not low-sodium or reduced-sodium). Angie Fava. Olives. Grains Baked goods made with fat, such as croissants, muffins, or some breads. Dry pasta or rice meal packs. Meats  and other proteins Fatty cuts of meat. Ribs. Fried meat. Berniece Salines. Bologna, salami, and other precooked or cured meats, such as sausages or meat loaves. Fat from the back of a pig (fatback). Bratwurst. Salted nuts and seeds. Canned beans with added salt. Canned or smoked fish. Whole eggs or egg yolks. Chicken or [place] with skin. Dairy Whole or 2% milk, cream, and half-and-half. Whole or full-fat cream cheese. Whole-fat or sweetened yogurt. Full-fat cheese. Nondairy creamers. Whipped toppings. Processed cheese and cheese spreads. Fats and oils Butter. Stick margarine. Lard. Shortening. Ghee. Bacon fat. Tropical oils, such as coconut, palm kernel, or palm oil. Seasonings and condiments Onion salt, garlic salt, seasoned salt, table salt, and sea salt. Worcestershire sauce. Tartar sauce. Barbecue sauce. Teriyaki sauce. Soy sauce, including reduced-sodium. Steak sauce. Canned and packaged gravies. Fish sauce. Oyster sauce. Cocktail sauce. Store-bought horseradish. Ketchup. Mustard. Meat flavorings and tenderizers. Bouillon cubes. Hot sauces. Pre-made or packaged marinades. Pre-made or packaged taco seasonings. Relishes. Regular salad dressings. Other foods Salted popcorn and pretzels. The items listed above may not be a complete list of foods and beverages you should avoid. Contact a dietitian for more information. Where to find more information  National Heart, Lung, and Blood Institute: https://wilson-eaton.com/  American Heart Association: www.heart.org  Academy  of Nutrition and Dietetics: www.eatright.[place]: www.kidney.org Summary  The DASH eating plan is a healthy eating plan that has been shown to reduce high blood pressure (hypertension). It may also reduce your risk for type 2 diabetes, heart disease, and stroke.  When on the DASH eating plan, aim to eat more fresh fruits and vegetables, whole grains, lean proteins, low-fat dairy, and heart-healthy fats.  With the DASH  eating plan, you should limit salt (sodium) intake to 2,300 mg a day. If you have hypertension, you may need to reduce your sodium intake to 1,500 mg a day.  Work with your health care provider or dietitian to adjust your eating plan to your individual calorie needs. This information is not intended to replace advice given to you by your health care provider. Make sure you discuss any questions you have with your health care provider. Document Revised: 12/02/2018 Document Reviewed: 12/02/2018 Elsevier Patient Education  2021 [employer].

## 2020-03-25 NOTE — Progress Notes (Signed)
Provider: Caid Radin FNP-C  Alison Curry, DO  Patient Care Team: Alison Curry, DO as PCP - General (Geriatric Medicine) Alison [name] Alison Freud, MD as Referring Physician (Optometry) Alison Maudlin, MD as Consulting Physician (Orthopedic Surgery) Alison Fillers, MD as Consulting Physician (Ophthalmology) Alison Artist, MD as Consulting Physician (Gastroenterology) Alison Sons, MD (Urology)  Extended Emergency Contact Information Primary Emergency Contact: Alison [name] Address: [address]           [place], [place] [address] Alison [name] of [place] Phone: [phone] Relation: Sister Secondary Emergency Contact: Alison [name],Alison [name] Address: [address]          [place], [place] [address] Alison [name] of [place] Phone: [phone] Mobile Phone: [phone] Relation: Daughter  Code Status:  Full Code  Goals of care: Advanced Directive information Advanced Directives 03/25/2020  Does Patient Have a Medical Advance Directive? No  Type of Advance Directive -  Does patient want to make changes to medical advance directive? -  Would patient like information on creating a medical advance directive? No - Patient declined  Pre-existing out of facility DNR order (yellow form or pink MOST form) -     Chief Complaint  Patient presents with  . Hospitalization Follow-up    Complains of Blood Pressure still being high.    HPI:  Pt is a 75 y.o. female seen today for an acute visit for evaluation of high blood pressure.she was in the ED 03/22/2020 after she went to urgent care and her blood pressure was noted to be over 200 was advised to go to ED.labs done were unremarkable.CT scan was normal.B/p was down in the 160's-170's after a migraine cocktail and hydralazine was given.Her Lisinopril was increased to from 10 mg tablet to 20 mg tablet daily and advised to follow up with PCP.   Past Medical History:  Diagnosis Date  . Anxiety   . Benign essential  hypertension   . Blood transfusion without reported diagnosis   . Cataract    removed bilateraly  . Diabetes mellitus without complication ([place])   . Diverticulitis of colon (without mention of hemorrhage)(562.11)   . GERD (gastroesophageal reflux disease)   . Goiter, specified as simple   . Helicobacter pylori gastritis   . Hyperlipidemia LDL goal < 100   . Intrahepatic bile duct dilation   . Numbness    TOES RT FOOT and toes left foot  . Obesity, unspecified   . Osteoarthrosis, unspecified whether generalized or localized, lower leg   . Osteopenia   . Other specified disease of nail    FUNGUS OF FINGERNAILS  . Pernicious anemia   . Unspecified glaucoma(365.9)   . Unspecified vitamin D deficiency   . Wears dentures   . Wears glasses    Past Surgical History:  Procedure Laterality Date  . BACK SURGERY    . CATARACT EXTRACTION     right eye 06/20/15 left eye 09/12/15  . COLECTOMY  2009  . COLONOSCOPY  09/06/2007   Dr Carol Ada  . DILATION AND CURETTAGE OF UTERUS    . ENDOSCOPIC RETROGRADE CHOLANGIOPANCREATOGRAPHY (ERCP) WITH PROPOFOL N/A 03/17/2019   Procedure: ENDOSCOPIC RETROGRADE CHOLANGIOPANCREATOGRAPHY (ERCP) WITH PROPOFOL;  Surgeon: Alison Artist, MD;  Location: [facility] ENDOSCOPY;  Service: Endoscopy;  Laterality: N/A;  . JOINT REPLACEMENT     right  . LUMBAR LAMINECTOMY/DECOMPRESSION MICRODISCECTOMY N/A 03/30/2013   Procedure: CENTRAL DECOMPRESSION L4 - L5 AND EXCISION 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;  Surgeon: Alison Bastos, MD;  Location: [facility] ORS;  Service: Orthopedics;  Laterality: N/A;  . MULTIPLE TOOTH EXTRACTIONS    . REMOVAL OF STONES  03/17/2019   Procedure: REMOVAL OF STONES;  Surgeon: Alison Artist, MD;  Location: [facility] ENDOSCOPY;  Service: Endoscopy;;  . SIGMOIDOSCOPY  02/16/2018   Dr Fuller Plan  . SPHINCTEROTOMY  03/17/2019   Procedure: SPHINCTEROTOMY;  Surgeon: Alison Artist, MD;  Location: [place];  Service: Endoscopy;;  . TOTAL KNEE ARTHROPLASTY Right  02/01/2013   Procedure: RIGHT TOTAL KNEE ARTHROPLASTY;  Surgeon: Alison Bastos, MD;  Location: [facility] ORS;  Service: Orthopedics;  Laterality: Right;  . TOTAL KNEE ARTHROPLASTY Left 04/18/2014   Procedure: LEFT TOTAL KNEE ARTHROPLASTY;  Surgeon: Alison Maudlin, MD;  Location: [facility] ORS;  Service: Orthopedics;  Laterality: Left;    Allergies  Allergen Reactions  . Aspirin Nausea Only    Can take 81 mg but can't take $RemoveBe'325mg'lezxvoWrR$ , hard on stomach.    Outpatient Encounter Medications as of 03/25/2020  Medication Sig  . acetaminophen (TYLENOL) 650 MG CR tablet Take 1,300 mg by mouth in the morning and at bedtime.  Marland Kitchen aspirin EC 81 MG tablet Take 81 mg by mouth daily.  Marland Kitchen atorvastatin (LIPITOR) 40 MG tablet TAKE 1 TABLET BY MOUTH ONCE DAILY AT  6PM  . Calcium Carb-Cholecalciferol (CALCIUM 600 + D PO) Take 1 tablet by mouth daily.  . carboxymethylcellulose (REFRESH PLUS) 0.5 % SOLN Place 1 drop into both eyes 3 (three) times daily as needed (dry eyes).   . Cholecalciferol (VITAMIN D-3) 1000 UNITS CAPS Take 1,000 Units by mouth daily.   . COMBIGAN 0.2-0.5 % ophthalmic solution Place 1 drop into both eyes in the morning and at bedtime.   . gabapentin (NEURONTIN) 300 MG capsule Take 1 capsule by mouth at bedtime  . Homeopathic Products (THERAWORX RELIEF EX) Apply 1 application topically daily as needed (pain).  Marland Kitchen latanoprost (XALATAN) 0.005 % ophthalmic solution Place 1 drop into both eyes at bedtime.  Marland Kitchen lisinopril (ZESTRIL) 10 MG tablet Take 1 tablet by mouth once daily  . meloxicam (MOBIC) 15 MG tablet Take 15 mg by mouth daily.   . metFORMIN (GLUCOPHAGE) 500 MG tablet TAKE 1/2 (ONE-HALF) TABLET BY MOUTH IN THE EVENING WITH SUPPER  . Multiple Vitamin (MULTIVITAMIN) tablet Take 1 tablet by mouth daily.  . Omega-3 Fatty Acids (OMEGA 3 PO) Take 1,200 mg by mouth daily.  . pantoprazole (PROTONIX) 40 MG tablet Take 1 tablet (40 mg total) by mouth daily.  Marland Kitchen trolamine salicylate (ASPERCREME) 10 % cream Apply 1  application topically as needed for muscle pain.  . [DISCONTINUED] Menthol, Topical Analgesic, (ICY HOT EX) Apply 1 application topically daily as needed (pain).   No facility-administered encounter medications on file as of 03/25/2020.    Review of Systems  Constitutional: Negative for appetite change, chills, fatigue and fever.  HENT: Positive for rhinorrhea. Negative for congestion, sinus pressure, sinus pain, sneezing, sore throat and tinnitus.   Eyes: Positive for visual disturbance. Negative for pain, discharge, redness and itching.       Wears eye glasses   Respiratory: Negative for cough, chest tightness, shortness of breath and wheezing.   Cardiovascular: Negative for chest pain, palpitations and leg swelling.  Gastrointestinal: Negative for abdominal distention, abdominal pain, constipation, diarrhea, nausea, rectal pain and vomiting.  Genitourinary: Negative for difficulty urinating, dysuria, flank pain, frequency and urgency.  Musculoskeletal: Positive for arthralgias and gait problem. Negative for back pain.  Skin: Negative for color change, pallor and rash.  Neurological: Negative for dizziness, weakness, light-headedness, numbness and headaches.  Hematological: Does not bruise/bleed easily.  Psychiatric/Behavioral: Negative for agitation and sleep disturbance.    Immunization History  Administered Date(s) Administered  . Fluad Quad(high Dose 65+) 09/05/2018  . Influenza, High Dose Seasonal PF 10/02/2017, 11/20/2019  . Influenza,inj,Quad PF,6+ Mos 10/03/2012, 09/21/2014, 11/07/2015  . Influenza,inj,quad, With Preservative 11/12/2016  . Influenza-Unspecified 10/28/2013  . PFIZER(Purple Top)SARS-COV-2 Vaccination 03/25/2019, 03/25/2019, 04/18/2019, 11/01/2019  . Pneumococcal Conjugate-13 01/04/2014, 01/04/2014, 11/04/2016  . Pneumococcal Polysaccharide-23 12/29/2012, 11/19/2017  . Zoster Recombinat (Shingrix) 11/25/2016, 01/26/2017   Pertinent  Health Maintenance Due   Topic Date Due  . OPHTHALMOLOGY EXAM  05/15/2020  . HEMOGLOBIN A1C  07/08/2020  . FOOT EXAM  01/10/2021  . COLONOSCOPY (Pts 45-23yrs Insurance coverage will need to be confirmed)  02/17/2023  . INFLUENZA VACCINE  Completed  . DEXA SCAN  Completed  . PNA vac Low Risk Adult  Completed   Fall Risk  03/25/2020 01/11/2020 09/07/2019 06/09/2019 05/22/2019  Falls in the past year? 0 0 0 0 0  Number falls in past yr: 0 0 0 0 0  Injury with Fall? 0 0 0 0 -  Risk for fall due to : - - - - -   Functional Status Survey:    Vitals:   03/25/20 0957  BP: (!) 142/90  Pulse: 88  Resp: 18  Temp: (!) 96.2 F (35.7 C)  SpO2: 96%  Weight: 210 lb 6.4 oz (95.4 kg)  Height: $Remove'5\' 6"'oKDeqfQ$  (1.676 m)   Body mass index is 33.96 kg/m. Physical Exam Vitals reviewed.  Constitutional:      General: She is not in acute distress.    Appearance: She is obese. She is not ill-appearing.  HENT:     Head: Normocephalic.     Right Ear: Tympanic membrane, ear canal and external ear normal. There is no impacted cerumen.     Left Ear: There is impacted cerumen.     Nose: Nose normal. No congestion or rhinorrhea.     Mouth/Throat:     Mouth: Mucous membranes are moist.     Pharynx: Oropharynx is clear. No oropharyngeal exudate or posterior oropharyngeal erythema.  Eyes:     General: No scleral icterus.       Right eye: No discharge.        Left eye: No discharge.     Extraocular Movements: Extraocular movements intact.     Conjunctiva/sclera: Conjunctivae normal.     Pupils: Pupils are equal, round, and reactive to light.  Neck:     Vascular: No carotid bruit.  Cardiovascular:     Rate and Rhythm: Normal rate and regular rhythm.     Pulses: Normal pulses.     Heart sounds: Normal heart sounds. No murmur heard. No friction rub. No gallop.   Pulmonary:     Effort: Pulmonary effort is normal. No respiratory distress.     Breath sounds: Normal breath sounds. No wheezing, rhonchi or rales.  Chest:     Chest wall:  No tenderness.  Abdominal:     General: Bowel sounds are normal. There is no distension.     Palpations: Abdomen is soft. There is no mass.     Tenderness: There is no abdominal tenderness. There is no right CVA tenderness, left CVA tenderness or guarding.     Hernia: No hernia is present.  Musculoskeletal:        General: No swelling or tenderness. Normal range of motion.  Cervical back: Normal range of motion. No rigidity or tenderness.     Right lower leg: No edema.     Left lower leg: No edema.  Lymphadenopathy:     Cervical: No cervical adenopathy.  Skin:    General: Skin is warm and dry.     Coloration: Skin is not pale.     Findings: No bruising, erythema or rash.  Neurological:     Mental Status: She is alert and oriented to person, place, and time.     Cranial Nerves: No cranial nerve deficit.     Sensory: No sensory deficit.     Motor: No weakness.     Gait: Gait normal.  Psychiatric:        Mood and Affect: Mood normal.        Speech: Speech normal.        Behavior: Behavior normal.        Thought Content: Thought content normal.        Judgment: Judgment normal.     Labs reviewed: Recent Labs    09/04/19 1019 01/08/20 0830 03/22/20 1753  NA 137 139 133*  K 4.4 4.6 3.8  CL 105 103 102  CO2 $Re'27 30 27  'fvg$ GLUCOSE 100* 110* 141*  BUN $Re'9 10 13  'HmF$ CREATININE 0.75 0.89 0.98  CALCIUM 9.5 9.8 9.3   Recent Labs    05/29/19 0924 09/04/19 1019 01/08/20 0830  AST 52* 46* 24  ALT 77* 50* 22  BILITOT 0.5 0.4 0.6  PROT 8.2* 8.1 7.8   Recent Labs    05/29/19 0924 09/04/19 1019 01/08/20 0830 03/22/20 1753  WBC 10.7 9.4 8.9 11.3*  NEUTROABS 7,340 6,082 5,714  --   HGB 12.3 12.4 12.8 12.2  HCT 38.1 38.4 40.0 38.1  MCV 88.2 88.5 89.9 89.6  PLT 417* 342 333 322   Lab Results  Component Value Date   TSH 0.48 09/04/2019   Lab Results  Component Value Date   HGBA1C 6.1 (H) 01/08/2020   Lab Results  Component Value Date   CHOL 129 01/08/2020   HDL 41 (L)  01/08/2020   LDLCALC 69 01/08/2020   TRIG 102 01/08/2020   CHOLHDL 3.1 01/08/2020    Significant Diagnostic Results in last 30 days:  DG Chest 2 View  Result Date: 03/22/2020 CLINICAL DATA:  Chest pain.  Hypertension. EXAM: CHEST - 2 VIEW COMPARISON:  04/11/2014 FINDINGS: The cardiomediastinal contours are normal. Atherosclerosis of the thoracic aorta. Mild peribronchial thickening. Subsegmental atelectasis in the right upper lobe. Pulmonary vasculature is normal. No consolidation, pleural effusion, or pneumothorax. No acute osseous abnormalities are seen. IMPRESSION: 1. Mild peribronchial thickening may be related to bronchitis or history of smoking. 2. Subsegmental atelectasis in the right upper lobe. Electronically Signed   By: Keith Rake M.D.   On: 03/22/2020 18:44   CT Head Wo Contrast  Result Date: 03/22/2020 CLINICAL DATA:  Headache, hypertension EXAM: CT HEAD WITHOUT CONTRAST TECHNIQUE: Contiguous axial images were obtained from the base of the skull through the vertex without intravenous contrast. COMPARISON:  None. FINDINGS: Brain: No evidence of acute infarction, hemorrhage, hydrocephalus, extra-axial collection or mass lesion/mass effect. Periventricular and deep white matter hypodensity. Vascular: No hyperdense vessel or unexpected calcification. Skull: Normal. Negative for fracture or focal lesion. Sinuses/Orbits: No acute finding. Other: None. IMPRESSION: No acute intracranial pathology. Small-vessel white matter disease. Electronically Signed   By: Eddie Candle M.D.   On: 03/22/2020 19:38    Assessment/Plan 1. Benign essential hypertension  Status post ED visit as above for elevated blood pressure lisinopril increased to 20 mg tablet daily.B/p still not at goal this visit . Will add amlodipine 5 mg tablet daily.side effects discussed verbalized understanding. - Advised to check blood pressure daily and record on log provided during visit.bring log to next visit for  evaluation. - Dietary modification and exercise advised.  - Additional education information on DASH eating plan provided on AVS  - CMP with eGFR([facility]) - amLODipine (NORVASC) 5 MG tablet; Take 1 tablet (5 mg total) by mouth daily.  Dispense: 90 tablet; Refill: 3  2. Leukocytosis, unspecified type Recent WBC at the Hospital was 11.3  Afebrile.No acute issues. - CBC with Differential/Platelet  3. Hyponatremia Na+133 ( 03/22/2020) Will recheck labs. - CMP with eGFR([facility])  4. Impacted cerumen of left ear TM not visualized due to cerumen impaction. Advised to instil debrox as below then follow up in 5 days for lavage.  - carbamide peroxide (DEBROX) 6.5 % OTIC solution; Place 5 drops into the left ear 2 (two) times daily.  Dispense: 15 mL; Refill: 0  Family/ staff Communication: Reviewed plan of care with patient verbalized understanding.  Labs/tests ordered:   - CMP with eGFR([facility]) - CBC with Differential/Platelet  Next Appointment: 5 days for left ear lavage and blood pressure evaluation.  Sandrea Hughs, NP

## 2020-03-26 LAB — CBC WITH DIFFERENTIAL/PLATELET
Absolute Monocytes: 737 cells/uL (ref 200–950)
Basophils Absolute: 36 cells/uL (ref 0–200)
Basophils Relative: 0.4 %
Eosinophils Absolute: 155 cells/uL (ref 15–500)
Eosinophils Relative: 1.7 %
HCT: 39.7 % (ref 35.0–45.0)
Hemoglobin: 12.9 g/dL (ref 11.7–15.5)
Lymphs Abs: 2011 cells/uL (ref 850–3900)
MCH: 29.1 pg (ref 27.0–33.0)
MCHC: 32.5 g/dL (ref 32.0–36.0)
MCV: 89.4 fL (ref 80.0–100.0)
MPV: 10.3 fL (ref 7.5–12.5)
Monocytes Relative: 8.1 %
Neutro Abs: 6161 cells/uL (ref 1500–7800)
Neutrophils Relative %: 67.7 %
Platelets: 324 10*3/uL (ref 140–400)
RBC: 4.44 10*6/uL (ref 3.80–5.10)
RDW: 12.4 % (ref 11.0–15.0)
Total Lymphocyte: 22.1 %
WBC: 9.1 10*3/uL (ref 3.8–10.8)

## 2020-03-26 LAB — COMPLETE METABOLIC PANEL WITH GFR
AG Ratio: 1.3 (calc) (ref 1.0–2.5)
ALT: 15 U/L (ref 6–29)
AST: 21 U/L (ref 10–35)
Albumin: 4.4 g/dL (ref 3.6–5.1)
Alkaline phosphatase (APISO): 65 U/L (ref 37–153)
BUN/Creatinine Ratio: 14 (calc) (ref 6–22)
BUN: 13 mg/dL (ref 7–25)
CO2: 25 mmol/L (ref 20–32)
Calcium: 9.8 mg/dL (ref 8.6–10.4)
Chloride: 103 mmol/L (ref 98–110)
Creat: 0.94 mg/dL — ABNORMAL HIGH (ref 0.60–0.93)
GFR, Est African American: 69 mL/min/{1.73_m2} (ref >=60)
GFR, Est Non African American: 59 mL/min/{1.73_m2} — ABNORMAL LOW (ref >=60)
Globulin: 3.3 g/dL (calc) (ref 1.9–3.7)
Glucose, Bld: 119 mg/dL (ref 65–139)
Potassium: 4.4 mmol/L (ref 3.5–5.3)
Sodium: 136 mmol/L (ref 135–146)
Total Bilirubin: 0.7 mg/dL (ref 0.2–1.2)
Total Protein: 7.7 g/dL (ref 6.1–8.1)

## 2020-03-27 ENCOUNTER — Ambulatory Visit: Payer: Medicare Other | Admitting: Internal Medicine

## 2020-03-28 ENCOUNTER — Ambulatory Visit (HOSPITAL_COMMUNITY)
Admission: EM | Admit: 2020-03-28 | Discharge: 2020-03-28 | Disposition: A | Discharge status: Home / Self Care | Payer: Medicare Other

## 2020-03-28 ENCOUNTER — Other Ambulatory Visit: Payer: Self-pay

## 2020-03-28 ENCOUNTER — Encounter (HOSPITAL_COMMUNITY): Payer: Self-pay

## 2020-03-28 NOTE — ED Triage Notes (Signed)
Pt reports eye discomfort and nauseous x Pt states  'I think my blood pressure is high". Denies chest pain, headache, vision changes, abdominal pain, SOB, dizziness.

## 2020-03-28 NOTE — ED Notes (Addendum)
Pt will follow up with PCP as blood pressure was not as high as she though and states she will go home and relax as er daughter told her. Pt was seeing by her PCP on 03/25/2020 and was advised to follow up, lisinopril was increased from 210 mg to 20 mg.

## 2020-04-01 ENCOUNTER — Other Ambulatory Visit: Payer: Self-pay

## 2020-04-01 ENCOUNTER — Encounter: Payer: Self-pay | Admitting: Family

## 2020-04-01 ENCOUNTER — Ambulatory Visit (INDEPENDENT_AMBULATORY_CARE_PROVIDER_SITE_OTHER): Payer: Medicare Other | Admitting: Family

## 2020-04-01 VITALS — BP 138/88 | HR 93 | Temp 98.0°F | Resp 18 | Ht 66.0 in | Wt 212.8 lb

## 2020-04-01 DIAGNOSIS — I10 Essential (primary) hypertension: Secondary | ICD-10-CM | POA: Diagnosis not present

## 2020-04-01 DIAGNOSIS — K219 Gastro-esophageal reflux disease without esophagitis: Secondary | ICD-10-CM | POA: Diagnosis not present

## 2020-04-01 NOTE — Patient Instructions (Signed)
Gastroesophageal Reflux Disease, Adult  Gastroesophageal reflux (GER) happens when acid from the stomach flows up into the tube that connects the mouth and the stomach (esophagus). Normally, food travels down the esophagus and stays in the stomach to be digested. With GER, food and stomach acid sometimes move back up into the esophagus. You may have a disease called gastroesophageal reflux disease (GERD) if the reflux:  Happens often.  Causes frequent or very bad symptoms.  Causes problems such as damage to the esophagus. When this happens, the esophagus becomes sore and swollen. Over time, GERD can make small holes (ulcers) in the lining of the esophagus. What are the causes? This condition is caused by a problem with the muscle between the esophagus and the stomach. When this muscle is weak or not normal, it does not close properly to keep food and acid from coming back up from the stomach. The muscle can be weak because of:  Tobacco use.  Pregnancy.  Having a certain type of hernia (hiatal hernia).  Alcohol use.  Certain foods and drinks, such as coffee, chocolate, onions, and peppermint. What increases the risk?  Being overweight.  Having a disease that affects your connective tissue.  Taking NSAIDs, such a ibuprofen. What are the signs or symptoms?  Heartburn.  Difficult or painful swallowing.  The feeling of having a lump in the throat.  A bitter taste in the mouth.  Bad breath.  Having a lot of saliva.  Having an upset or bloated stomach.  Burping.  Chest pain. Different conditions can cause chest pain. Make sure you see your doctor if you have chest pain.  Shortness of breath or wheezing.  A long-term cough or a cough at night.  Wearing away of the surface of teeth (tooth enamel).  Weight loss. How is this treated?  Making changes to your diet.  Taking medicine.  Having surgery. Treatment will depend on how bad your symptoms are. Follow these  instructions at home: Eating and drinking  Follow a diet as told by your doctor. You may need to avoid foods and drinks such as: ? Coffee and tea, with or without caffeine. ? Drinks that contain alcohol. ? Energy drinks and sports drinks. ? Bubbly (carbonated) drinks or sodas. ? Chocolate and cocoa. ? Peppermint and mint flavorings. ? Garlic and onions. ? Horseradish. ? Spicy and acidic foods. These include peppers, chili powder, curry powder, vinegar, hot sauces, and BBQ sauce. ? Citrus fruit juices and citrus fruits, such as oranges, lemons, and limes. ? Tomato-based foods. These include red sauce, chili, salsa, and pizza with red sauce. ? Fried and fatty foods. These include donuts, french fries, potato chips, and high-fat dressings. ? High-fat meats. These include hot dogs, rib eye steak, sausage, ham, and bacon. ? High-fat dairy items, such as whole milk, butter, and cream cheese.  Eat small meals often. Avoid eating large meals.  Avoid drinking large amounts of liquid with your meals.  Avoid eating meals during the 2-3 hours before bedtime.  Avoid lying down right after you eat.  Do not exercise right after you eat.   Lifestyle  Do not smoke or use any products that contain nicotine or tobacco. If you need help quitting, ask your doctor.  Try to lower your stress. If you need help doing this, ask your doctor.  If you are overweight, lose an amount of weight that is healthy for you. Ask your doctor about a safe weight loss goal.   General instructions  Pay attention to any changes in your symptoms.  Take over-the-counter and prescription medicines only as told by your doctor.  Do not take aspirin, ibuprofen, or other NSAIDs unless your doctor says it is okay.  Wear loose clothes. Do not wear anything tight around your waist.  Raise (elevate) the head of your bed about 6 inches (15 cm). You may need to use a wedge to do this.  Avoid bending over if this makes your  symptoms worse.  Keep all follow-up visits. Contact a doctor if:  You have new symptoms.  You lose weight and you do not know why.  You have trouble swallowing or it hurts to swallow.  You have wheezing or a cough that keeps happening.  You have a hoarse voice.  Your symptoms do not get better with treatment. Get help right away if:  You have sudden pain in your arms, neck, jaw, teeth, or back.  You suddenly feel sweaty, dizzy, or light-headed.  You have chest pain or shortness of breath.  You vomit and the vomit is green, yellow, or black, or it looks like blood or coffee grounds.  You faint.  Your poop (stool) is red, bloody, or black.  You cannot swallow, drink, or eat. These symptoms may represent a serious problem that is an emergency. Do not wait to see if the symptoms will go away. Get medical help right away. Call your local emergency services (911 in the [place] Do not drive yourself to the hospital. Summary  If a person has gastroesophageal reflux disease (GERD), food and stomach acid move back up into the esophagus and cause symptoms or problems such as damage to the esophagus.  Treatment will depend on how bad your symptoms are.  Follow a diet as told by your doctor.  Take all medicines only as told by your doctor. This information is not intended to replace advice given to you by your health care provider. Make sure you discuss any questions you have with your health care provider. Document Revised: 07/10/2019 Document Reviewed: 07/10/2019 Elsevier Patient Education  [place].

## 2020-04-01 NOTE — Progress Notes (Signed)
Provider: Marlowe Sax FNP-C  Tiye Huwe, Nelda Bucks, NP  Patient Care Team: Shyah Cadmus, Nelda Bucks, NP as PCP - General (Family Medicine) Shirley Muscat Loreen Freud, MD as Referring Physician (Optometry) Latanya Maudlin, MD as Consulting Physician (Orthopedic Surgery) Warden Fillers, MD as Consulting Physician (Ophthalmology) Ladene Artist, MD as Consulting Physician (Gastroenterology) Abbie Sons, MD (Urology)  Extended Emergency Contact Information Primary Emergency Contact: Bowman,Lillie Address: [address]           [place], [place] [address] [name] of [place] Phone: [phone] Relation: Sister Secondary Emergency Contact: Heyde,[name] Address: [address]          [place], [place] [address] [name] of [place] Phone: [phone] Mobile Phone: [phone] Relation: Daughter   Code Status: Full Code  Goals of care: Advanced Directive information Advanced Directives 04/01/2020  Does Patient Have a Medical Advance Directive? No  Type of Advance Directive -  Does patient want to make changes to medical advance directive? -  Would patient like information on creating a medical advance directive? No - Patient declined  Pre-existing out of facility DNR order (yellow form or pink MOST form) -     Chief Complaint  Patient presents with  . Follow-up    BP & Ear Lavage.    HPI:  Pt is a 75 y.o. female seen today for an acute visit for evaluation of right ear lavage and follow up high blood pressure reading.she was here 03/25/2020 for follow up ED visit on 3/11/ 2022 with SB/p over 200 was treated with a migraine cocktail and hydralazine SBP went down to 160's -170's  Her lisinopril was increased from 10 mg to 20 mg tablet in the ED. During her last visit here with me 03/25/2020 B/p was still elevated 140/90 amlodipine 5 mg tablet daily was added. Also had right ear cerumen impaction was advised to instil debrox 6.5 otic solution at home x 4 days then follow  up for ear lavage. She denies any acute issues this visit.States her B/p cuff still reads SBP in the 140's.B/p checked here today is 138/88    Past Medical History:  Diagnosis Date  . Anxiety   . Benign essential hypertension   . Blood transfusion without reported diagnosis   . Cataract    removed bilateraly  . Diabetes mellitus without complication ([place])   . Diverticulitis of colon (without mention of hemorrhage)(562.11)   . GERD (gastroesophageal reflux disease)   . Goiter, specified as simple   . Helicobacter pylori gastritis   . Hyperlipidemia LDL goal < 100   . Intrahepatic bile duct dilation   . Numbness    TOES RT FOOT and toes left foot  . Obesity, unspecified   . Osteoarthrosis, unspecified whether generalized or localized, lower leg   . Osteopenia   . Other specified disease of nail    FUNGUS OF FINGERNAILS  . Pernicious anemia   . Unspecified glaucoma(365.9)   . Unspecified vitamin D deficiency   . Wears dentures   . Wears glasses    Past Surgical History:  Procedure Laterality Date  . BACK SURGERY    . CATARACT EXTRACTION     right eye 06/20/15 left eye 09/12/15  . COLECTOMY  2009  . COLONOSCOPY  09/06/2007   Dr Carol Ada  . DILATION AND CURETTAGE OF UTERUS    . ENDOSCOPIC RETROGRADE CHOLANGIOPANCREATOGRAPHY (ERCP) WITH PROPOFOL N/A 03/17/2019   Procedure: ENDOSCOPIC RETROGRADE CHOLANGIOPANCREATOGRAPHY (ERCP) WITH PROPOFOL;  Surgeon: Ladene Artist, MD;  Location: [facility] ENDOSCOPY;  Service: Endoscopy;  Laterality: N/A;  . JOINT REPLACEMENT     right  . LUMBAR LAMINECTOMY/DECOMPRESSION MICRODISCECTOMY N/A 03/30/2013   Procedure: CENTRAL DECOMPRESSION L4 - L5 AND EXCISION 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;  Surgeon: Tobi Bastos, MD;  Location: [facility];  Service: Orthopedics;  Laterality: N/A;  . MULTIPLE TOOTH EXTRACTIONS    . REMOVAL OF STONES  03/17/2019   Procedure: REMOVAL OF STONES;  Surgeon: Ladene Artist, MD;  Location: [facility] ENDOSCOPY;  Service: Endoscopy;;   . SIGMOIDOSCOPY  02/16/2018   Dr Fuller Plan  . SPHINCTEROTOMY  03/17/2019   Procedure: SPHINCTEROTOMY;  Surgeon: Ladene Artist, MD;  Location: [facility];  Service: Endoscopy;;  . TOTAL KNEE ARTHROPLASTY Right 02/01/2013   Procedure: RIGHT TOTAL KNEE ARTHROPLASTY;  Surgeon: Tobi Bastos, MD;  Location: [facility];  Service: Orthopedics;  Laterality: Right;  . TOTAL KNEE ARTHROPLASTY Left 04/18/2014   Procedure: LEFT TOTAL KNEE ARTHROPLASTY;  Surgeon: Latanya Maudlin, MD;  Location: [facility];  Service: Orthopedics;  Laterality: Left;    Allergies  Allergen Reactions  . Aspirin Nausea Only    Can take 81 mg but can't take 325mg , hard on stomach.    Outpatient Encounter Medications as of 04/01/2020  Medication Sig  . acetaminophen (TYLENOL) 650 MG CR tablet Take 1,300 mg by mouth in the morning and at bedtime.  Marland Kitchen amLODipine (NORVASC) 5 MG tablet Take 1 tablet (5 mg total) by mouth daily.  Marland Kitchen aspirin EC 81 MG tablet Take 81 mg by mouth daily.  Marland Kitchen atorvastatin (LIPITOR) 40 MG tablet TAKE 1 TABLET BY MOUTH ONCE DAILY AT  6PM  . Calcium Carb-Cholecalciferol (CALCIUM 600 + D PO) Take 1 tablet by mouth daily.  . carbamide peroxide (DEBROX) 6.5 % OTIC solution Place 5 drops into the left ear 2 (two) times daily.  . carboxymethylcellulose (REFRESH PLUS) 0.5 % SOLN Place 1 drop into both eyes 3 (three) times daily as needed (dry eyes).   . Cholecalciferol (VITAMIN D-3) 1000 UNITS CAPS Take 1,000 Units by mouth daily.   . COMBIGAN 0.2-0.5 % ophthalmic solution Place 1 drop into both eyes in the morning and at bedtime.   . gabapentin (NEURONTIN) 300 MG capsule Take 1 capsule by mouth at bedtime  . Homeopathic Products (THERAWORX RELIEF EX) Apply 1 application topically daily as needed (pain).  Marland Kitchen latanoprost (XALATAN) 0.005 % ophthalmic solution Place 1 drop into both eyes at bedtime.  Marland Kitchen lisinopril (ZESTRIL) 10 MG tablet Take 1 tablet by mouth once daily  . meloxicam (MOBIC) 15 MG tablet Take 15 mg by mouth  daily.   . metFORMIN (GLUCOPHAGE) 500 MG tablet TAKE 1/2 (ONE-HALF) TABLET BY MOUTH IN THE EVENING WITH SUPPER  . Multiple Vitamin (MULTIVITAMIN) tablet Take 1 tablet by mouth daily.  . Omega-3 Fatty Acids (OMEGA 3 PO) Take 1,200 mg by mouth daily.  . pantoprazole (PROTONIX) 40 MG tablet Take 1 tablet (40 mg total) by mouth daily.  Marland Kitchen trolamine salicylate (ASPERCREME) 10 % cream Apply 1 application topically as needed for muscle pain.   No facility-administered encounter medications on file as of 04/01/2020.    Review of Systems  Constitutional: Negative for appetite change, chills, fatigue and fever.  Eyes: Negative for discharge, redness, itching and visual disturbance.  Respiratory: Negative for cough, chest tightness, shortness of breath and wheezing.   Cardiovascular: Negative for chest pain, palpitations and leg swelling.  Gastrointestinal: Negative for abdominal distention, abdominal pain, diarrhea, nausea and vomiting.  Endocrine: Negative for cold intolerance, heat intolerance, polydipsia, polyphagia and polyuria.  Genitourinary: Negative for difficulty urinating, dysuria, flank pain, frequency and urgency.  Skin: Negative for color change and pallor.  Neurological: Negative for dizziness, speech difficulty, weakness, light-headedness, numbness and headaches.  Hematological: Does not bruise/bleed easily.  Psychiatric/Behavioral: Negative for agitation, confusion and sleep disturbance. The patient is not nervous/anxious.     Immunization History  Administered Date(s) Administered  . Fluad Quad(high Dose 65+) 09/05/2018  . Influenza, High Dose Seasonal PF 10/02/2017, 11/20/2019  . Influenza,inj,Quad PF,6+ Mos 10/03/2012, 09/21/2014, 11/07/2015  . Influenza,inj,quad, With Preservative 11/12/2016  . Influenza-Unspecified 10/28/2013  . PFIZER(Purple Top)SARS-COV-2 Vaccination 03/25/2019, 03/25/2019, 04/18/2019, 11/01/2019  . Pneumococcal Conjugate-13 01/04/2014, 01/04/2014,  11/04/2016  . Pneumococcal Polysaccharide-23 12/29/2012, 11/19/2017  . Zoster Recombinat (Shingrix) 11/25/2016, 01/26/2017   Pertinent  Health Maintenance Due  Topic Date Due  . OPHTHALMOLOGY EXAM  05/15/2020  . HEMOGLOBIN A1C  07/08/2020  . FOOT EXAM  01/10/2021  . COLONOSCOPY (Pts 45-30yrs Insurance coverage will need to be confirmed)  02/17/2023  . INFLUENZA VACCINE  Completed  . DEXA SCAN  Completed  . PNA vac Low Risk Adult  Completed   Fall Risk  04/01/2020 03/25/2020 01/11/2020 09/07/2019 06/09/2019  Falls in the past year? 0 0 0 0 0  Number falls in past yr: 0 0 0 0 0  Injury with Fall? 0 0 0 0 0  Risk for fall due to : - - - - -   Functional Status Survey:    Vitals:   04/01/20 0949  BP: 138/88  Pulse: 93  Resp: 18  Temp: 98 F (36.7 C)  SpO2: 97%  Weight: 212 lb 12.8 oz (96.5 kg)  Height: 5\' 6"  (1.676 m)   Body mass index is 34.35 kg/m. Physical Exam Vitals reviewed.  Constitutional:      General: She is not in acute distress.    Appearance: She is obese. She is not ill-appearing.  HENT:     Head: Normocephalic.     Right Ear: Tympanic membrane, ear canal and external ear normal. There is no impacted cerumen.     Left Ear: There is impacted cerumen.     Ears:     Comments: Left ear cerumen lavaged with warm water and hydrogen peroxide moderate amounts of cerumen obtained.Tolerated procedure well.TM clear without any signs of infection.    Nose: Nose normal. No congestion or rhinorrhea.     Mouth/Throat:     Mouth: Mucous membranes are moist.     Pharynx: Oropharynx is clear. No oropharyngeal exudate or posterior oropharyngeal erythema.  Eyes:     General: No scleral icterus.       Right eye: No discharge.        Left eye: No discharge.     Conjunctiva/sclera: Conjunctivae normal.     Pupils: Pupils are equal, round, and reactive to light.  Neck:     Vascular: No carotid bruit.  Cardiovascular:     Rate and Rhythm: Normal rate and regular rhythm.      Pulses: Normal pulses.     Heart sounds: Normal heart sounds. No murmur heard. No friction rub. No gallop.   Pulmonary:     Effort: Pulmonary effort is normal. No respiratory distress.     Breath sounds: Normal breath sounds. No wheezing, rhonchi or rales.  Chest:     Chest wall: No tenderness.  Abdominal:     General: Bowel sounds are normal. There is no distension.  Palpations: Abdomen is soft. There is no mass.     Tenderness: There is no abdominal tenderness. There is no right CVA tenderness, left CVA tenderness, guarding or rebound.  Musculoskeletal:        General: No swelling or tenderness. Normal range of motion.     Cervical back: Normal range of motion. No rigidity or tenderness.     Right lower leg: No edema.     Left lower leg: No edema.  Lymphadenopathy:     Cervical: No cervical adenopathy.  Skin:    General: Skin is warm and dry.     Coloration: Skin is not pale.     Findings: No bruising, erythema, lesion or rash.  Neurological:     Mental Status: She is alert and oriented to person, place, and time.     Cranial Nerves: No cranial nerve deficit.     Sensory: No sensory deficit.     Motor: No weakness.     Coordination: Coordination normal.     Gait: Gait normal.  Psychiatric:        Mood and Affect: Mood normal.        Behavior: Behavior normal.        Thought Content: Thought content normal.        Judgment: Judgment normal.     Labs reviewed: Recent Labs    01/08/20 0830 03/22/20 1753 03/25/20 1101  NA 139 133* 136  K 4.6 3.8 4.4  CL 103 102 103  CO2 30 27 25   GLUCOSE 110* 141* 119  BUN 10 13 13   CREATININE 0.89 0.98 0.94*  CALCIUM 9.8 9.3 9.8   Recent Labs    09/04/19 1019 01/08/20 0830 03/25/20 1101  AST 46* 24 21  ALT 50* 22 15  BILITOT 0.4 0.6 0.7  PROT 8.1 7.8 7.7   Recent Labs    09/04/19 1019 01/08/20 0830 03/22/20 1753 03/25/20 1101  WBC 9.4 8.9 11.3* 9.1  NEUTROABS 6,082 5,714  --  6,161  HGB 12.4 12.8 12.2 12.9   HCT 38.4 40.0 38.1 39.7  MCV 88.5 89.9 89.6 89.4  PLT 342 333 322 324   Lab Results  Component Value Date   TSH 0.48 09/04/2019   Lab Results  Component Value Date   HGBA1C 6.1 (H) 01/08/2020   Lab Results  Component Value Date   CHOL 129 01/08/2020   HDL 41 (L) 01/08/2020   LDLCALC 69 01/08/2020   TRIG 102 01/08/2020   CHOLHDL 3.1 01/08/2020    Significant Diagnostic Results in last 30 days:  DG Chest 2 View  Result Date: 03/22/2020 CLINICAL DATA:  Chest pain.  Hypertension. EXAM: CHEST - 2 VIEW COMPARISON:  04/11/2014 FINDINGS: The cardiomediastinal contours are normal. Atherosclerosis of the thoracic aorta. Mild peribronchial thickening. Subsegmental atelectasis in the right upper lobe. Pulmonary vasculature is normal. No consolidation, pleural effusion, or pneumothorax. No acute osseous abnormalities are seen. IMPRESSION: 1. Mild peribronchial thickening may be related to bronchitis or history of smoking. 2. Subsegmental atelectasis in the right upper lobe. Electronically Signed   By: Keith Rake M.D.   On: 03/22/2020 18:44   CT Head Wo Contrast  Result Date: 03/22/2020 CLINICAL DATA:  Headache, hypertension EXAM: CT HEAD WITHOUT CONTRAST TECHNIQUE: Contiguous axial images were obtained from the base of the skull through the vertex without intravenous contrast. COMPARISON:  None. FINDINGS: Brain: No evidence of acute infarction, hemorrhage, hydrocephalus, extra-axial collection or mass lesion/mass effect. Periventricular and deep white matter hypodensity. Vascular: No  hyperdense vessel or unexpected calcification. Skull: Normal. Negative for fracture or focal lesion. Sinuses/Orbits: No acute finding. Other: None. IMPRESSION: No acute intracranial pathology. Small-vessel white matter disease. Electronically Signed   By: Eddie Candle M.D.   On: 03/22/2020 19:38    Assessment/Plan  1. Benign essential hypertension B/p at goal this visit some home readings SBP in the 140's  but majority of the readings at goal.will continue on Amlodipine 5 mg tablet daily and Lisinopril 10 mg tablet daily.will increase Amlodipine to 10 mg tablet if SBP persistently > 140 - Advised to check B/p and record on log provider then notify provider if B/p > 140/90  - continue on ASA and Statin for Cardiovascular event prevention. - advised to continue with dietary modification and exercise.    2. Gastroesophageal reflux disease without esophagitis Unable to wean off Protonix 40 mg tablet daily continues to require it. Advised to avoid aggravating foods and hot spicies. - H/H stable.  Will continue to monitor.   Family/ staff Communication: Reviewed plan of care with patient verbalized understanding.   Labs/tests ordered: None   Next Appointment: Has appointment 05/22/2020   Sandrea Hughs, NP

## 2020-04-24 ENCOUNTER — Telehealth: Payer: Self-pay

## 2020-04-24 MED ORDER — LISINOPRIL 20 MG PO TABS: 20.0000 mg | ORAL_TABLET | Freq: Every day | ORAL | 1 refills | Status: DC

## 2020-04-24 NOTE — Telephone Encounter (Signed)
Message left on clinical intake voicemail:   Patient recently seen [place] and [place] told patient to increase her lisinopril from 10 mg to 20 mg. Patient needs a refill sent to pharmacy for 20 mg tablet.  I reviewed medication list and medication was not updated as it should be anytime a medication is changed   I will send to [place], [place], NP to review,  updated medication list, and to send rx to the pharmacy, if in fact patient was told to increase medication

## 2020-04-24 NOTE — Telephone Encounter (Signed)
Spoke with patient, patient states when she seen Dinah on 04/01/2020 she told her that she was taken Lisinopril 20 mg and her medication list should have been updated at that time regardless if the ED physician prescribed or not, I agreed that we should update medication list at the time of visit if it differs from what we have listed  Medication list updated and rx sent to pharmacy

## 2020-04-24 NOTE — Telephone Encounter (Signed)
Lisinopril was increased from 10 mg to 20 mg tablet in the ED not by PCP.May change lisinopril to 20 mg tablet one by mouth daily and send to ED.

## 2020-04-25 DIAGNOSIS — Z79899 Other long term (current) drug therapy: Secondary | ICD-10-CM | POA: Insufficient documentation

## 2020-04-26 DIAGNOSIS — Z79899 Other long term (current) drug therapy: Secondary | ICD-10-CM | POA: Diagnosis not present

## 2020-04-30 ENCOUNTER — Other Ambulatory Visit: Payer: Self-pay | Admitting: Internal Medicine

## 2020-04-30 DIAGNOSIS — E1149 Type 2 diabetes mellitus with other diabetic neurological complication: Secondary | ICD-10-CM

## 2020-05-13 ENCOUNTER — Other Ambulatory Visit: Payer: Self-pay

## 2020-05-13 ENCOUNTER — Other Ambulatory Visit: Payer: Medicare Other

## 2020-05-13 DIAGNOSIS — E785 Hyperlipidemia, unspecified: Secondary | ICD-10-CM | POA: Diagnosis not present

## 2020-05-13 DIAGNOSIS — E1149 Type 2 diabetes mellitus with other diabetic neurological complication: Secondary | ICD-10-CM

## 2020-05-13 DIAGNOSIS — D72829 Elevated white blood cell count, unspecified: Secondary | ICD-10-CM | POA: Diagnosis not present

## 2020-05-13 DIAGNOSIS — E1169 Type 2 diabetes mellitus with other specified complication: Secondary | ICD-10-CM

## 2020-05-13 DIAGNOSIS — I10 Essential (primary) hypertension: Secondary | ICD-10-CM

## 2020-05-13 DIAGNOSIS — E871 Hypo-osmolality and hyponatremia: Secondary | ICD-10-CM

## 2020-05-14 LAB — LIPID PANEL
Cholesterol: 132 mg/dL (ref <200)
HDL: 44 mg/dL — ABNORMAL LOW (ref >=50)
LDL Cholesterol (Calc): 70 mg/dL (calc)
Non-HDL Cholesterol (Calc): 88 mg/dL (calc) (ref <130)
Total CHOL/HDL Ratio: 3 (calc) (ref <5.0)
Triglycerides: 95 mg/dL (ref <150)

## 2020-05-14 LAB — COMPLETE METABOLIC PANEL WITH GFR
AG Ratio: 1.3 (calc) (ref 1.0–2.5)
ALT: 15 U/L (ref 6–29)
AST: 19 U/L (ref 10–35)
Albumin: 4.4 g/dL (ref 3.6–5.1)
Alkaline phosphatase (APISO): 69 U/L (ref 37–153)
BUN: 12 mg/dL (ref 7–25)
CO2: 27 mmol/L (ref 20–32)
Calcium: 9.9 mg/dL (ref 8.6–10.4)
Chloride: 101 mmol/L (ref 98–110)
Creat: 0.83 mg/dL (ref 0.60–0.93)
GFR, Est African American: 80 mL/min/{1.73_m2} (ref >=60)
GFR, Est Non African American: 69 mL/min/{1.73_m2} (ref >=60)
Globulin: 3.5 g/dL (calc) (ref 1.9–3.7)
Glucose, Bld: 98 mg/dL (ref 65–99)
Potassium: 4.4 mmol/L (ref 3.5–5.3)
Sodium: 136 mmol/L (ref 135–146)
Total Bilirubin: 0.5 mg/dL (ref 0.2–1.2)
Total Protein: 7.9 g/dL (ref 6.1–8.1)

## 2020-05-14 LAB — CBC WITH DIFFERENTIAL/PLATELET
Absolute Monocytes: 600 cells/uL (ref 200–950)
Basophils Absolute: 48 cells/uL (ref 0–200)
Basophils Relative: 0.6 %
Eosinophils Absolute: 192 cells/uL (ref 15–500)
Eosinophils Relative: 2.4 %
HCT: 37.7 % (ref 35.0–45.0)
Hemoglobin: 12.2 g/dL (ref 11.7–15.5)
Lymphs Abs: 2176 cells/uL (ref 850–3900)
MCH: 29.5 pg (ref 27.0–33.0)
MCHC: 32.4 g/dL (ref 32.0–36.0)
MCV: 91.1 fL (ref 80.0–100.0)
MPV: 10.5 fL (ref 7.5–12.5)
Monocytes Relative: 7.5 %
Neutro Abs: 4984 cells/uL (ref 1500–7800)
Neutrophils Relative %: 62.3 %
Platelets: 322 10*3/uL (ref 140–400)
RBC: 4.14 10*6/uL (ref 3.80–5.10)
RDW: 13.3 % (ref 11.0–15.0)
Total Lymphocyte: 27.2 %
WBC: 8 10*3/uL (ref 3.8–10.8)

## 2020-05-14 LAB — HEMOGLOBIN A1C
Hgb A1c MFr Bld: 6 % of total Hgb — ABNORMAL HIGH (ref <5.7)
Mean Plasma Glucose: 126 mg/dL
eAG (mmol/L): 7 mmol/L

## 2020-05-15 ENCOUNTER — Encounter: Payer: Self-pay | Admitting: Orthopedic Surgery

## 2020-05-15 ENCOUNTER — Other Ambulatory Visit: Payer: Self-pay

## 2020-05-15 ENCOUNTER — Ambulatory Visit: Payer: Medicare Other | Admitting: Family Medicine

## 2020-05-15 ENCOUNTER — Ambulatory Visit (INDEPENDENT_AMBULATORY_CARE_PROVIDER_SITE_OTHER): Payer: Medicare Other | Admitting: Orthopedic Surgery

## 2020-05-15 VITALS — BP 118/74 | HR 88 | Temp 96.7°F | Ht 66.0 in | Wt 205.6 lb

## 2020-05-15 DIAGNOSIS — G8929 Other chronic pain: Secondary | ICD-10-CM

## 2020-05-15 DIAGNOSIS — E785 Hyperlipidemia, unspecified: Secondary | ICD-10-CM

## 2020-05-15 DIAGNOSIS — M25561 Pain in right knee: Secondary | ICD-10-CM

## 2020-05-15 DIAGNOSIS — H6122 Impacted cerumen, left ear: Secondary | ICD-10-CM

## 2020-05-15 DIAGNOSIS — E871 Hypo-osmolality and hyponatremia: Secondary | ICD-10-CM

## 2020-05-15 DIAGNOSIS — K219 Gastro-esophageal reflux disease without esophagitis: Secondary | ICD-10-CM

## 2020-05-15 DIAGNOSIS — M25562 Pain in left knee: Secondary | ICD-10-CM

## 2020-05-15 DIAGNOSIS — R310 Gross hematuria: Secondary | ICD-10-CM | POA: Diagnosis not present

## 2020-05-15 DIAGNOSIS — I10 Essential (primary) hypertension: Secondary | ICD-10-CM

## 2020-05-15 DIAGNOSIS — E1169 Type 2 diabetes mellitus with other specified complication: Secondary | ICD-10-CM | POA: Diagnosis not present

## 2020-05-15 DIAGNOSIS — M5442 Lumbago with sciatica, left side: Secondary | ICD-10-CM | POA: Diagnosis not present

## 2020-05-15 DIAGNOSIS — Z6834 Body mass index (BMI) 34.0-34.9, adult: Secondary | ICD-10-CM

## 2020-05-15 DIAGNOSIS — E1149 Type 2 diabetes mellitus with other diabetic neurological complication: Secondary | ICD-10-CM | POA: Diagnosis not present

## 2020-05-15 DIAGNOSIS — D72829 Elevated white blood cell count, unspecified: Secondary | ICD-10-CM

## 2020-05-15 NOTE — Patient Instructions (Addendum)
Preventive Care 76 Years and Older, Female Preventive care refers to lifestyle choices and visits with your health care provider that can promote health and wellness. This includes:  A yearly physical exam. This is also called an annual wellness visit.  Regular dental and eye exams.  Immunizations.  Screening for certain conditions.  Healthy lifestyle choices, such as: ? Eating a healthy diet. ? Getting regular exercise. ? Not using drugs or products that contain nicotine and tobacco. ? Limiting alcohol use. What can I expect for my preventive care visit? Physical exam Your health care provider will check your:  Height and weight. These may be used to calculate your BMI (body mass index). BMI is a measurement that tells if you are at a healthy weight.  Heart rate and blood pressure.  Body temperature.  Skin for abnormal spots. Counseling Your health care provider may ask you questions about your:  Past medical problems.  Family's medical history.  Alcohol, tobacco, and drug use.  Emotional well-being.  Home life and relationship well-being.  Sexual activity.  Diet, exercise, and sleep habits.  History of falls.  Memory and ability to understand (cognition).  Work and work Statistician.  Pregnancy and menstrual history.  Access to firearms. What immunizations do I need? Vaccines are usually given at various ages, according to a schedule. Your health care provider will recommend vaccines for you based on your age, medical history, and lifestyle or other factors, such as travel or where you work.   What tests do I need? Blood tests  Lipid and cholesterol levels. These may be checked every 5 years, or more often depending on your overall health.  Hepatitis C test.  Hepatitis B test. Screening  Lung cancer screening. You may have this screening every year starting at age 76 if you have a 30-pack-year history of smoking and currently smoke or have quit within  the past 15 years.  Colorectal cancer screening. ? All adults should have this screening starting at age 44 and continuing until age 58. ? Your health care provider may recommend screening at age 2 if you are at increased risk. ? You will have tests every 1-10 years, depending on your results and the type of screening test.  Diabetes screening. ? This is done by checking your blood sugar (glucose) after you have not eaten for a while (fasting). ? You may have this done every 1-3 years.  Mammogram. ? This may be done every 1-2 years. ? Talk with your health care provider about how often you should have regular mammograms.  Abdominal aortic aneurysm (AAA) screening. You may need this if you are a current or former smoker.  BRCA-related cancer screening. This may be done if you have a family history of breast, ovarian, tubal, or peritoneal cancers. Other tests  STD (sexually transmitted disease) testing, if you are at risk.  Bone density scan. This is done to screen for osteoporosis. You may have this done starting at age 76. Talk with your health care provider about your test results, treatment options, and if necessary, the need for more tests. Follow these instructions at home: Eating and drinking  Eat a diet that includes fresh fruits and vegetables, whole grains, lean protein, and low-fat dairy products. Limit your intake of foods with high amounts of sugar, saturated fats, and salt.  Take vitamin and mineral supplements as recommended by your health care provider.  Do not drink alcohol if your health care provider tells you not to drink.  If you drink alcohol: ? Limit how much you have to 0-1 drink a day. ? Be aware of how much alcohol is in your drink. In the [place] one drink equals one 12 oz bottle of beer (355 mL), one 5 oz glass of wine (148 mL), or one 1 oz glass of hard liquor (44 mL).   Lifestyle  Take daily care of your teeth and gums. Brush your teeth every morning  and night with fluoride toothpaste. Floss one time each day.  Stay active. Exercise for at least 30 minutes 5 or more days each week.  Do not use any products that contain nicotine or tobacco, such as cigarettes, e-cigarettes, and chewing tobacco. If you need help quitting, ask your health care provider.  Do not use drugs.  If you are sexually active, practice safe sex. Use a condom or other form of protection in order to prevent STIs (sexually transmitted infections).  Talk with your health care provider about taking a low-dose aspirin or statin.  Find healthy ways to cope with stress, such as: ? Meditation, yoga, or listening to music. ? Journaling. ? Talking to a trusted person. ? Spending time with friends and family. Safety  Always wear your seat belt while driving or riding in a vehicle.  Do not drive: ? If you have been drinking alcohol. Do not ride with someone who has been drinking. ? When you are tired or distracted. ? While texting.  Wear a helmet and other protective equipment during sports activities.  If you have firearms in your house, make sure you follow all gun safety procedures. What's next?  Visit your health care provider once a year for an annual wellness visit.  Ask your health care provider how often you should have your eyes and teeth checked.  Stay up to date on all vaccines. This information is not intended to replace advice given to you by your health care provider. Make sure you discuss any questions you have with your health care provider. Document Revised: 12/20/2019 Document Reviewed: 12/23/2017 Elsevier Patient Education  2021 [employer]  

## 2020-05-15 NOTE — Progress Notes (Signed)
Careteam: Patient Care Team: Ngetich, Nelda Bucks, NP as PCP - General (Family Medicine) Shirley Muscat Loreen Freud, MD as Referring Physician (Optometry) Latanya Maudlin, MD as Consulting Physician (Orthopedic Surgery) Warden Fillers, MD as Consulting Physician (Ophthalmology) Ladene Artist, MD as Consulting Physician (Gastroenterology) Abbie Sons, MD (Urology)  Seen by: Windell Moulding, AGNP-C  PLACE OF SERVICE:  [place] [facility] Directive information    Allergies  Allergen Reactions  . Aspirin Nausea Only    Can take 81 mg but can't take 325mg , hard on stomach.    Chief Complaint  Patient presents with  . Medical Management of Chronic Issues     HPI: Patient is a 75 y.o. female seen today for medical management of chronic condition.   Labs reviewed with patient.   Right ear feeling better. No complaints of hearing.   Reports some salt use in foods. Eats canned foods, but gets low sodium. Admits to having occasional sweet.   Continues to have bilateral knee pain. Followed by Dr. Gladstone Lighter, he advised discontinuing the meloxicam due to kidney function. Takes tylenol 1300 mg bid, Aspercreme. Recent labs sent to Dr. Gladstone Lighter per patient request.   Walking daily for about 15-20 minutes. Also doing stationary bike for about 15 minutes.   Plans to get covid booster at [place] soon.   Plans to see Dr. Katy Fitch for eye exam 05/25.  Seeing urology 05/10 for yearly f/u for hematuria. Denies recent hematuria.   Continues to take Protonix for acid reflux. Reports less nausea.   Ambulates with cane. No recent falls or injuries.   Review of Systems:  Review of Systems  Constitutional: Negative for chills, fever, malaise/fatigue and weight loss.  HENT: Negative for hearing loss and sore throat.   Eyes: Negative for blurred vision and double vision.  Respiratory: Negative for cough, shortness of breath and wheezing.   Cardiovascular: Negative for chest pain and leg swelling.   Gastrointestinal: Positive for heartburn. Negative for abdominal pain, blood in stool, constipation, nausea and vomiting.  Genitourinary: Positive for hematuria. Negative for dysuria.  Musculoskeletal: Positive for back pain, joint pain and myalgias.  Neurological: Negative for dizziness, weakness and headaches.  Psychiatric/Behavioral: Negative for suicidal ideas. The patient is not nervous/anxious.     Past Medical History:  Diagnosis Date  . Anxiety   . Benign essential hypertension   . Blood transfusion without reported diagnosis   . Cataract    removed bilateraly  . Diabetes mellitus without complication ([place])   . Diverticulitis of colon (without mention of hemorrhage)(562.11)   . GERD (gastroesophageal reflux disease)   . Goiter, specified as simple   . Helicobacter pylori gastritis   . Hyperlipidemia LDL goal < 100   . Intrahepatic bile duct dilation   . Numbness    TOES RT FOOT and toes left foot  . Obesity, unspecified   . Osteoarthrosis, unspecified whether generalized or localized, lower leg   . Osteopenia   . Other specified disease of nail    FUNGUS OF FINGERNAILS  . Pernicious anemia   . Unspecified glaucoma(365.9)   . Unspecified vitamin D deficiency   . Wears dentures   . Wears glasses    Past Surgical History:  Procedure Laterality Date  . BACK SURGERY    . CATARACT EXTRACTION     right eye 06/20/15 left eye 09/12/15  . COLECTOMY  2009  . COLONOSCOPY  09/06/2007   Dr Carol Ada  . DILATION AND CURETTAGE OF UTERUS    .  ENDOSCOPIC RETROGRADE CHOLANGIOPANCREATOGRAPHY (ERCP) WITH PROPOFOL N/A 03/17/2019   Procedure: ENDOSCOPIC RETROGRADE CHOLANGIOPANCREATOGRAPHY (ERCP) WITH PROPOFOL;  Surgeon: Ladene Artist, MD;  Location[facility]COPY;  Service: Endoscopy;  Laterality: N/A;  . JOINT REPLACEMENT     right  . LUMBAR LAMINECTOMY/DECOMPRESSION MICRODISCECTOMY N/A 03/30/2013   Procedure: CENTRAL DECOMPRESSION L4 - L5 AND EXCISION 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;   Surgeon: Tobi Bastos, MD;  Location: [facility];  Service: Orthopedics;  Laterality: N/A;  . MULTIPLE TOOTH EXTRACTIONS    . REMOVAL OF STONES  03/17/2019   Procedure: REMOVAL OF STONES;  Surgeon: Ladene Artist, MD;  Location: [facility] ENDOSCOPY;  Service: Endoscopy;;  . SIGMOIDOSCOPY  02/16/2018   Dr Fuller Plan  . SPHINCTEROTOMY  03/17/2019   Procedure: SPHINCTEROTOMY;  Surgeon: Ladene Artist, MD;  Location: [place];  Service: Endoscopy;;  . TOTAL KNEE ARTHROPLASTY Right 02/01/2013   Procedure: RIGHT TOTAL KNEE ARTHROPLASTY;  Surgeon: Tobi Bastos, MD;  Location: [facility];  Service: Orthopedics;  Laterality: Right;  . TOTAL KNEE ARTHROPLASTY Left 04/18/2014   Procedure: LEFT TOTAL KNEE ARTHROPLASTY;  Surgeon: Latanya Maudlin, MD;  Location: [facility];  Service: Orthopedics;  Laterality: Left;   Social History:   reports that she quit smoking about 14 months ago. Her smoking use included cigarettes. She has a 15.00 pack-year smoking history. She has never used smokeless tobacco. She reports that she does not drink alcohol and does not use drugs.  Family History  Problem Relation Age of Onset  . Alzheimer's disease Mother   . Stroke Mother   . Hyperlipidemia Mother   . Heart disease Father   . Diabetes Sister   . Colon polyps Neg Hx   . Colon cancer Neg Hx   . Esophageal cancer Neg Hx   . Rectal cancer Neg Hx   . Stomach cancer Neg Hx     Medications: Patient's Medications  New Prescriptions   No medications on file  Previous Medications   ACETAMINOPHEN (TYLENOL) 650 MG CR TABLET    Take 1,300 mg by mouth in the morning and at bedtime.   AMLODIPINE (NORVASC) 5 MG TABLET    Take 1 tablet (5 mg total) by mouth daily.   ASPIRIN EC 81 MG TABLET    Take 81 mg by mouth daily.   ATORVASTATIN (LIPITOR) 40 MG TABLET    TAKE 1 TABLET BY MOUTH ONCE DAILY AT  6PM   CALCIUM CARB-CHOLECALCIFEROL (CALCIUM 600 + D PO)    Take 1 tablet by mouth daily.   CARBOXYMETHYLCELLULOSE (REFRESH PLUS) 0.5 % SOLN     Place 1 drop into both eyes 3 (three) times daily as needed (dry eyes).    CHOLECALCIFEROL (VITAMIN D-3) 1000 UNITS CAPS    Take 1,000 Units by mouth daily.    COMBIGAN 0.2-0.5 % OPHTHALMIC SOLUTION    Place 1 drop into both eyes in the morning and at bedtime.    GABAPENTIN (NEURONTIN) 300 MG CAPSULE    Take 1 capsule by mouth at bedtime   HOMEOPATHIC PRODUCTS (THERAWORX RELIEF EX)    Apply 1 application topically daily as needed (pain).   LATANOPROST (XALATAN) 0.005 % OPHTHALMIC SOLUTION    Place 1 drop into both eyes at bedtime.   LISINOPRIL (ZESTRIL) 20 MG TABLET    Take 1 tablet (20 mg total) by mouth daily.   METFORMIN (GLUCOPHAGE) 500 MG TABLET    TAKE 1/2 (ONE-HALF) TABLET BY MOUTH IN THE EVENING WITH SUPPER   MULTIPLE  VITAMIN (MULTIVITAMIN) TABLET    Take 1 tablet by mouth daily.   OMEGA-3 FATTY ACIDS (OMEGA 3 PO)    Take 1,200 mg by mouth daily.   PANTOPRAZOLE (PROTONIX) 40 MG TABLET    Take 1 tablet (40 mg total) by mouth daily.   TROLAMINE SALICYLATE (ASPERCREME) 10 % CREAM    Apply 1 application topically as needed for muscle pain.  Modified Medications   No medications on file  Discontinued Medications   CARBAMIDE PEROXIDE (DEBROX) 6.5 % OTIC SOLUTION    Place 5 drops into the left ear 2 (two) times daily.   MELOXICAM (MOBIC) 15 MG TABLET    Take 15 mg by mouth daily.     Physical Exam:  Vitals:   05/15/20 0932  BP: 104/68  Pulse: 88  Temp: (!) 96.7 F (35.9 C)  SpO2: 98%  Weight: 205 lb 9.6 oz (93.3 kg)  Height: 5\' 6"  (1.676 m)   Body mass index is 33.18 kg/m. Wt Readings from Last 3 Encounters:  05/15/20 205 lb 9.6 oz (93.3 kg)  04/01/20 212 lb 12.8 oz (96.5 kg)  03/25/20 210 lb 6.4 oz (95.4 kg)    Physical Exam Vitals reviewed.  Constitutional:      General: She is not in acute distress. HENT:     Head: Normocephalic.     Right Ear: There is no impacted cerumen.     Left Ear: There is no impacted cerumen.  Eyes:     General:        Right eye: No  discharge.        Left eye: No discharge.  Neck:     Vascular: No carotid bruit.  Cardiovascular:     Rate and Rhythm: Normal rate and regular rhythm.     Pulses: Normal pulses.     Heart sounds: Normal heart sounds. No murmur heard.   Pulmonary:     Effort: Pulmonary effort is normal. No respiratory distress.     Breath sounds: Normal breath sounds. No wheezing.  Abdominal:     General: Bowel sounds are normal. There is no distension.     Palpations: Abdomen is soft.     Tenderness: There is no abdominal tenderness.  Musculoskeletal:     Cervical back: Normal range of motion.     Right lower leg: No edema.     Left lower leg: No edema.  Lymphadenopathy:     Cervical: No cervical adenopathy.  Skin:    General: Skin is warm and dry.     Capillary Refill: Capillary refill takes less than 2 seconds.  Neurological:     General: No focal deficit present.     Mental Status: She is alert and oriented to person, place, and time.  Psychiatric:        Mood and Affect: Mood normal.        Behavior: Behavior normal.     Labs reviewed: Basic Metabolic Panel: Recent Labs    05/29/19 0924 09/04/19 1019 01/08/20 0830 03/22/20 1753 03/25/20 1101 05/13/20 1111  NA 135 137   < > 133* 136 136  K 4.5 4.4   < > 3.8 4.4 4.4  CL 101 105   < > 102 103 101  CO2 29 27   < > 27 25 27   GLUCOSE 109* 100*   < > 141* 119 98  BUN 12 9   < > 13 13 12   CREATININE 0.87 0.75   < > 0.98 0.94* 0.83  CALCIUM 9.9 9.5   < > 9.3 9.8 9.9  TSH 0.53 0.48  --   --   --   --    < > = values in this interval not displayed.   Liver Function Tests: Recent Labs    01/08/20 0830 03/25/20 1101 05/13/20 1111  AST 24 21 19   ALT 22 15 15   BILITOT 0.6 0.7 0.5  PROT 7.8 7.7 7.9   No results for input(s): LIPASE, AMYLASE in the last 8760 hours. No results for input(s): AMMONIA in the last 8760 hours. CBC: Recent Labs    01/08/20 0830 03/22/20 1753 03/25/20 1101 05/13/20 1111  WBC 8.9 11.3* 9.1 8.0   NEUTROABS 5,714  --  6,161 4,984  HGB 12.8 12.2 12.9 12.2  HCT 40.0 38.1 39.7 37.7  MCV 89.9 89.6 89.4 91.1  PLT 333 322 324 322   Lipid Panel: Recent Labs    09/04/19 1019 01/08/20 0830 05/13/20 1111  CHOL 123 129 132  HDL 45* 41* 44*  LDLCALC 61 69 70  TRIG 83 102 95  CHOLHDL 2.7 3.1 3.0   TSH: Recent Labs    05/29/19 0924 09/04/19 1019  TSH 0.53 0.48   A1C: Lab Results  Component Value Date   HGBA1C 6.0 (H) 05/13/2020     Assessment/Plan 1. Benign essential hypertension - controlled - cont norvasc  - cont low sodium diet < 2000 mg per day - discussed low sodium canned food options  2. Gastroesophageal reflux disease without esophagitis - followed by GI - less nausea with protonix - advised about eating small frequent meals - recommend avoiding food triggers  3. Leukocytosis, unspecified type - WBC 8- 05/02 -cbc/diff- future  4. Hyponatremia - sodium 136- 05/02 - cmp- future  5. Impacted cerumen of left ear - resolved, cont debrox drops for prevention - do not use Q-tips  6. Gross hematuria - followed by urology- scheduled 05/10  7. Chronic left-sided low back pain with left-sided sciatica - stable with daily Neurontin and tylenol  8. Chronic pain of both knees - followed by Dr. Gladstone Lighter- labs forwarded - meloxicam discontinued due to kidney function - cont scheduled tylenol, and topical Aspercreme - may consider Voltaren gel in future  9. Type 2 diabetes mellitus with neurological complications (HCC) - 123456- 6.0 - cont 250 mg daily with dinner - cont yearly eye exam with Dr. Katy Fitch 05/25 - cont diet low in carbs and sugars - A1c- future  10. Hyperlipidemia associated with type 2 diabetes mellitus (HCC) - LDL 70- at goal - cont Lipitor 40 mg daily - cont diet low in fats and avoid fried food - lipid panel- future  11. BMI 34.0 - 34.9, adult - recommend weight loss, difficult with various orthopedic issues - advised to follow low  calorie diet < 1500 calories/day - encourage light exercise like walking or swimming 150 minutes/week  Total time: 38 minutes. Greater than 50% of total time spent doing patient counseling and coordination of care regarding diabetic diet, low fat diet, blood pressure control, and pain control for knee/ back pain.   Next appt: Visit date not found [place] [phone]

## 2020-05-16 ENCOUNTER — Ambulatory Visit: Payer: Medicare Other | Admitting: Internal Medicine

## 2020-05-17 DIAGNOSIS — M25561 Pain in right knee: Secondary | ICD-10-CM | POA: Diagnosis not present

## 2020-05-17 DIAGNOSIS — M25562 Pain in left knee: Secondary | ICD-10-CM | POA: Diagnosis not present

## 2020-05-20 ENCOUNTER — Other Ambulatory Visit: Payer: Medicare Other

## 2020-05-21 DIAGNOSIS — R311 Benign essential microscopic hematuria: Secondary | ICD-10-CM | POA: Diagnosis not present

## 2020-05-22 ENCOUNTER — Ambulatory Visit: Payer: Medicare Other | Admitting: Family

## 2020-05-23 ENCOUNTER — Ambulatory Visit: Payer: Self-pay | Admitting: Physician Assistant

## 2020-05-23 DIAGNOSIS — Z23 Encounter for immunization: Secondary | ICD-10-CM | POA: Diagnosis not present

## 2020-06-05 DIAGNOSIS — E119 Type 2 diabetes mellitus without complications: Secondary | ICD-10-CM | POA: Diagnosis not present

## 2020-06-05 DIAGNOSIS — H26493 Other secondary cataract, bilateral: Secondary | ICD-10-CM | POA: Diagnosis not present

## 2020-06-05 DIAGNOSIS — H43813 Vitreous degeneration, bilateral: Secondary | ICD-10-CM | POA: Diagnosis not present

## 2020-06-05 DIAGNOSIS — Z961 Presence of intraocular lens: Secondary | ICD-10-CM | POA: Diagnosis not present

## 2020-06-05 DIAGNOSIS — H401131 Primary open-angle glaucoma, bilateral, mild stage: Secondary | ICD-10-CM | POA: Diagnosis not present

## 2020-06-05 DIAGNOSIS — H35033 Hypertensive retinopathy, bilateral: Secondary | ICD-10-CM | POA: Diagnosis not present

## 2020-06-07 ENCOUNTER — Telehealth: Payer: Self-pay

## 2020-06-07 ENCOUNTER — Other Ambulatory Visit: Payer: Self-pay | Admitting: Orthopedic Surgery

## 2020-06-07 DIAGNOSIS — E785 Hyperlipidemia, unspecified: Secondary | ICD-10-CM

## 2020-06-07 DIAGNOSIS — E1169 Type 2 diabetes mellitus with other specified complication: Secondary | ICD-10-CM

## 2020-06-07 MED ORDER — ATORVASTATIN CALCIUM 40 MG PO TABS: ORAL_TABLET | ORAL | 1 refills | Status: DC

## 2020-06-07 MED ORDER — ATORVASTATIN CALCIUM 40 MG PO TABS
ORAL_TABLET | ORAL | 1 refills | Status: DC
Start: 2020-06-07 — End: 2021-05-26

## 2020-06-07 NOTE — Telephone Encounter (Signed)
Refill request received from pharmacy °

## 2020-06-13 ENCOUNTER — Other Ambulatory Visit: Payer: Self-pay | Admitting: *Deleted

## 2020-06-13 DIAGNOSIS — E1142 Type 2 diabetes mellitus with diabetic polyneuropathy: Secondary | ICD-10-CM

## 2020-06-13 MED ORDER — METFORMIN HCL 500 MG PO TABS: ORAL_TABLET | ORAL | 1 refills | Status: DC

## 2020-06-13 NOTE — Telephone Encounter (Signed)
[address]

## 2020-06-14 ENCOUNTER — Encounter: Payer: Medicare Other | Admitting: Family

## 2020-06-20 ENCOUNTER — Telehealth: Payer: Self-pay | Admitting: *Deleted

## 2020-06-20 NOTE — Telephone Encounter (Signed)
Patient notified and agreed.  

## 2020-06-20 NOTE — Telephone Encounter (Signed)
Patient called and stated that her blood pressure is running high. Taking medications as directed. And checked 2 hours after taking medication. Patient thinks it is coming from her knees hurting.  Today BP was 192/110 and she rechecked it and it was 203/119  Scheduled an appointment for tomorrow at 3:30 to be seen and evaluated. Instructed patient if any new symptoms to go to ER.   Please Advise. (Forwarded message to Amy due to Webb Silversmith out of office)

## 2020-06-21 ENCOUNTER — Ambulatory Visit (INDEPENDENT_AMBULATORY_CARE_PROVIDER_SITE_OTHER): Payer: Medicare Other | Admitting: Adult Health

## 2020-06-21 ENCOUNTER — Encounter: Payer: Self-pay | Admitting: Adult Health

## 2020-06-21 ENCOUNTER — Other Ambulatory Visit: Payer: Self-pay

## 2020-06-21 VITALS — BP 160/90 | HR 74 | Temp 97.5°F | Resp 16 | Ht 66.0 in | Wt 210.0 lb

## 2020-06-21 DIAGNOSIS — I10 Essential (primary) hypertension: Secondary | ICD-10-CM | POA: Diagnosis not present

## 2020-06-21 MED ORDER — LISINOPRIL 30 MG PO TABS: 30.0000 mg | ORAL_TABLET | Freq: Every day | ORAL | 3 refills | Status: DC

## 2020-06-21 NOTE — Patient Instructions (Signed)

## 2020-06-21 NOTE — Progress Notes (Signed)
[facility]  Provider:  Durenda Age  DNP  Code Status:  Full Code   Goals of Care:  Advanced Directives 06/21/2020  Does Patient Have a Medical Advance Directive? No  Type of Advance Directive -  Does patient want to make changes to medical advance directive? -  Would patient like information on creating a medical advance directive? No - Patient declined  Pre-existing out of facility DNR order (yellow form or pink MOST form) -     Chief Complaint  Patient presents with   Acute Visit    Complains of high blood pressure.    HPI: Patient is a 75 y.o. female seen today for an acute visit for elevated BPs. She said that her BPs at home were 193/103, 201/116 and 165/103. She denies having headaches nor chest pains. She currently takes Lisinopril 20 mg daily and Amlodipine 5 mg daily for hypertension.   Past Medical History:  Diagnosis Date   Anxiety    Benign essential hypertension    Blood transfusion without reported diagnosis    Cataract    removed bilateraly   Diabetes mellitus without complication ([place])    Diverticulitis of colon (without mention of hemorrhage)(562.11)    GERD (gastroesophageal reflux disease)    Goiter, specified as simple    Helicobacter pylori gastritis    Hyperlipidemia LDL goal < 100    Intrahepatic bile duct dilation    Numbness    TOES RT FOOT and toes left foot   Obesity, unspecified    Osteoarthrosis, unspecified whether generalized or localized, lower leg    Osteopenia    Other specified disease of nail    FUNGUS OF FINGERNAILS   Pernicious anemia    Unspecified glaucoma(365.9)    Unspecified vitamin D deficiency    Wears dentures    Wears glasses     Past Surgical History:  Procedure Laterality Date   BACK SURGERY     CATARACT EXTRACTION     right eye 06/20/15 left eye 09/12/15   COLECTOMY  2009   COLONOSCOPY  09/06/2007   Dr Carol Ada   DILATION AND CURETTAGE OF UTERUS     ENDOSCOPIC RETROGRADE  CHOLANGIOPANCREATOGRAPHY (ERCP) WITH PROPOFOL N/A 03/17/2019   Procedure: ENDOSCOPIC RETROGRADE CHOLANGIOPANCREATOGRAPHY (ERCP) WITH PROPOFOL;  Surgeon: Ladene Artist, MD;  Location[facility] ENDOSCOPY;  Service: Endoscopy;  Laterality: N/A;   JOINT REPLACEMENT     right   LUMBAR LAMINECTOMY/DECOMPRESSION MICRODISCECTOMY N/A 03/30/2013   Procedure: CENTRAL DECOMPRESSION L4 - L5 AND EXCISION 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;  Surgeon: Tobi Bastos, MD;  Location: [facility];  Service: Orthopedics;  Laterality: N/A;   MULTIPLE TOOTH EXTRACTIONS     REMOVAL OF STONES  03/17/2019   Procedure: REMOVAL OF STONES;  Surgeon: Ladene Artist, MD;  Location: [place];  Service: Endoscopy;;   SIGMOIDOSCOPY  02/16/2018   Dr Nira Conn  03/17/2019   Procedure: SPHINCTEROTOMY;  Surgeon: Ladene Artist, MD;  Location: [place];  Service: Endoscopy;;   TOTAL KNEE ARTHROPLASTY Right 02/01/2013   Procedure: RIGHT TOTAL KNEE ARTHROPLASTY;  Surgeon: Tobi Bastos, MD;  Location: [facility];  Service: Orthopedics;  Laterality: Right;   TOTAL KNEE ARTHROPLASTY Left 04/18/2014   Procedure: LEFT TOTAL KNEE ARTHROPLASTY;  Surgeon: Latanya Maudlin, MD;  Location: [facility];  Service: Orthopedics;  Laterality: Left;    Allergies  Allergen Reactions   Aspirin Nausea Only    Can take 81 mg but can't take 325mg , hard  on stomach.    Outpatient Encounter Medications as of 06/21/2020  Medication Sig   acetaminophen (TYLENOL) 650 MG CR tablet Take 1,300 mg by mouth in the morning and at bedtime.   amLODipine (NORVASC) 5 MG tablet Take 1 tablet (5 mg total) by mouth daily.   aspirin EC 81 MG tablet Take 81 mg by mouth daily.   atorvastatin (LIPITOR) 40 MG tablet TAKE 1 TABLET BY MOUTH ONCE DAILY AT  6PM   Calcium Carb-Cholecalciferol (CALCIUM 600 + D PO) Take 1 tablet by mouth daily.   carboxymethylcellulose (REFRESH PLUS) 0.5 % SOLN Place 1 drop into both eyes 3 (three) times daily as needed (dry eyes).    celecoxib  (CELEBREX) 200 MG capsule Take 200 mg by mouth daily.   Cholecalciferol (VITAMIN D-3) 1000 UNITS CAPS Take 1,000 Units by mouth daily.    COMBIGAN 0.2-0.5 % ophthalmic solution Place 1 drop into both eyes in the morning and at bedtime.    gabapentin (NEURONTIN) 300 MG capsule Take 1 capsule by mouth at bedtime   Homeopathic Products (THERAWORX RELIEF EX) Apply 1 application topically daily as needed (pain).   latanoprost (XALATAN) 0.005 % ophthalmic solution Place 1 drop into both eyes at bedtime.   lisinopril (ZESTRIL) 20 MG tablet Take 1 tablet (20 mg total) by mouth daily.   metFORMIN (GLUCOPHAGE) 500 MG tablet TAKE 1/2 (ONE-HALF) TABLET BY MOUTH IN THE EVENING WITH SUPPER   Multiple Vitamin (MULTIVITAMIN) tablet Take 1 tablet by mouth daily.   Omega-3 Fatty Acids (OMEGA 3 PO) Take 1,200 mg by mouth daily.   pantoprazole (PROTONIX) 40 MG tablet Take 1 tablet (40 mg total) by mouth daily.   trolamine salicylate (ASPERCREME) 10 % cream Apply 1 application topically as needed for muscle pain.   No facility-administered encounter medications on file as of 06/21/2020.    Review of Systems:  Review of Systems  Constitutional:  Negative for activity change, appetite change, chills and fever.  HENT:  Positive for congestion.   Eyes:  Negative for discharge.  Respiratory:  Negative for cough, shortness of breath and wheezing.   Cardiovascular:  Negative for palpitations and leg swelling.  Gastrointestinal:  Negative for abdominal pain, blood in stool, nausea and vomiting.  Genitourinary:  Negative for difficulty urinating and dysuria.  Musculoskeletal:  Positive for arthralgias.       Bilateral knee pain and bilateral hand fingers   Skin: Negative.    Health Maintenance  Topic Date Due   OPHTHALMOLOGY EXAM  05/15/2020   INFLUENZA VACCINE  08/12/2020   COVID-19 Vaccine (5 - Booster for Pfizer series) 09/23/2020   HEMOGLOBIN A1C  11/13/2020   FOOT EXAM  01/10/2021   COLONOSCOPY (Pts  45-71yrs Insurance coverage will need to be confirmed)  02/17/2023   TETANUS/TDAP  10/29/2023   DEXA SCAN  Completed   Hepatitis C Screening  Completed   PNA vac Low Risk Adult  Completed   Zoster Vaccines- Shingrix  Completed   HPV VACCINES  Aged Out    Physical Exam: Vitals:   06/21/20 1524  BP: (!) 160/90  Pulse: 74  Resp: 16  Temp: (!) 97.5 F (36.4 C)  SpO2: 93%  Weight: 210 lb (95.3 kg)  Height: 5\' 6"  (1.676 m)   Body mass index is 33.89 kg/m. Physical Exam HENT:     Head: Normocephalic and atraumatic.  Eyes:     Conjunctiva/sclera: Conjunctivae normal.  Cardiovascular:     Rate and Rhythm: Normal rate and regular rhythm.  Musculoskeletal:     Cervical back: Normal range of motion.    Labs reviewed: Basic Metabolic Panel: Recent Labs    09/04/19 1019 01/08/20 0830 03/22/20 1753 03/25/20 1101 05/13/20 1111  NA 137   < > 133* 136 136  K 4.4   < > 3.8 4.4 4.4  CL 105   < > 102 103 101  CO2 27   < > 27 25 27   GLUCOSE 100*   < > 141* 119 98  BUN 9   < > 13 13 12   CREATININE 0.75   < > 0.98 0.94* 0.83  CALCIUM 9.5   < > 9.3 9.8 9.9  TSH 0.48  --   --   --   --    < > = values in this interval not displayed.   Liver Function Tests: Recent Labs    01/08/20 0830 03/25/20 1101 05/13/20 1111  AST 24 21 19   ALT 22 15 15   BILITOT 0.6 0.7 0.5  PROT 7.8 7.7 7.9   No results for input(s): LIPASE, AMYLASE in the last 8760 hours. No results for input(s): AMMONIA in the last 8760 hours. CBC: Recent Labs    01/08/20 0830 03/22/20 1753 03/25/20 1101 05/13/20 1111  WBC 8.9 11.3* 9.1 8.0  NEUTROABS 5,714  --  6,161 4,984  HGB 12.8 12.2 12.9 12.2  HCT 40.0 38.1 39.7 37.7  MCV 89.9 89.6 89.4 91.1  PLT 333 322 324 322   Lipid Panel: Recent Labs    09/04/19 1019 01/08/20 0830 05/13/20 1111  CHOL 123 129 132  HDL 45* 41* 44*  LDLCALC 61 69 70  TRIG 83 102 95  CHOLHDL 2.7 3.1 3.0   Lab Results  Component Value Date   HGBA1C 6.0 (H) 05/13/2020      Assessment/Plan  1. Uncontrolled hypertension -  will increase dosage of Lisinopril -  instructed to monitor her BPs at home and log them and bring on her next appointment. - lisinopril (ZESTRIL) 30 MG tablet; Take 1 tablet (30 mg total) by mouth daily.  Dispense: 30 tablet; Refill: 3    Labs/tests ordered:  as needed  Next appt:  06/28/2020

## 2020-06-26 ENCOUNTER — Emergency Department (HOSPITAL_COMMUNITY)
Admission: EM | Admit: 2020-06-26 | Discharge: 2020-06-26 | Disposition: A | Discharge status: Home / Self Care | Payer: Medicare Other | Attending: Emergency Medicine | Admitting: Emergency Medicine

## 2020-06-26 ENCOUNTER — Encounter (HOSPITAL_COMMUNITY): Payer: Self-pay

## 2020-06-26 DIAGNOSIS — Z96653 Presence of artificial knee joint, bilateral: Secondary | ICD-10-CM | POA: Diagnosis not present

## 2020-06-26 DIAGNOSIS — Z87891 Personal history of nicotine dependence: Secondary | ICD-10-CM | POA: Insufficient documentation

## 2020-06-26 DIAGNOSIS — Z79899 Other long term (current) drug therapy: Secondary | ICD-10-CM | POA: Insufficient documentation

## 2020-06-26 DIAGNOSIS — Z7982 Long term (current) use of aspirin: Secondary | ICD-10-CM | POA: Diagnosis not present

## 2020-06-26 DIAGNOSIS — I10 Essential (primary) hypertension: Secondary | ICD-10-CM

## 2020-06-26 DIAGNOSIS — Z7984 Long term (current) use of oral hypoglycemic drugs: Secondary | ICD-10-CM | POA: Insufficient documentation

## 2020-06-26 DIAGNOSIS — E1149 Type 2 diabetes mellitus with other diabetic neurological complication: Secondary | ICD-10-CM | POA: Diagnosis not present

## 2020-06-26 DIAGNOSIS — R030 Elevated blood-pressure reading, without diagnosis of hypertension: Secondary | ICD-10-CM | POA: Diagnosis present

## 2020-06-26 LAB — CBG MONITORING, ED: Glucose-Capillary: 115 mg/dL — ABNORMAL HIGH (ref 70–99)

## 2020-06-26 MED ORDER — ACETAMINOPHEN 500 MG PO TABS
1000.0000 mg | ORAL_TABLET | Freq: Once | ORAL | Status: AC
  Administered 2020-06-26: 14:00:00 1000 mg via ORAL
  Filled 2020-06-26: qty 2
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
## 2020-06-26 NOTE — ED Notes (Signed)
Patient given water
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
## 2020-06-26 NOTE — Discharge Instructions (Addendum)
It was our pleasure to provide your ER care today - we hope that you feel better.  Take acetaminophen as need.  As relates your blood pressure, it is elevated, but does not require emergent lowering.  Continue your blood pressure medication, limit salt intake, and follow up primary care doctor in the coming week.   Return to ER if worse, new symptoms, new or severe pain, trouble breathing, or other concern.

## 2020-06-28 ENCOUNTER — Telehealth: Payer: Self-pay

## 2020-06-28 ENCOUNTER — Other Ambulatory Visit: Payer: Self-pay

## 2020-06-28 ENCOUNTER — Ambulatory Visit (INDEPENDENT_AMBULATORY_CARE_PROVIDER_SITE_OTHER): Payer: Medicare Other | Admitting: Family

## 2020-06-28 ENCOUNTER — Encounter: Payer: Self-pay | Admitting: Family

## 2020-06-28 DIAGNOSIS — Z0000 Encounter for general adult medical examination without abnormal findings: Secondary | ICD-10-CM | POA: Diagnosis not present

## 2020-06-28 NOTE — Patient Instructions (Signed)
Alison [name] , Thank you for taking time to come for your Medicare Wellness Visit. I appreciate your ongoing commitment to your health goals. Please review the following plan we discussed and let me know if I can assist you in the future.   Screening recommendations/referrals: Colonoscopy Up to date  Mammogram  Up to date  Bone Density  Up to date  Recommended yearly ophthalmology/optometry visit for glaucoma screening and checkup Recommended yearly dental visit for hygiene and checkup  Vaccinations: Influenza vaccine  Up to date  Pneumococcal vaccine  Up to date  Tdap vaccine  Up to date  Shingles vaccine  Up to date   Advanced directives: No   Conditions/risks identified: Advance age female > 34 yrs,type 2 DM,dyslipidemia,Obesity BMI > 30,Hypertension and Hx smoking   Next appointment: 1 year    Preventive Care 19 Years and Older, Female Preventive care refers to lifestyle choices and visits with your health care provider that can promote health and wellness. What does preventive care include? A yearly physical exam. This is also called an annual well check. Dental exams once or twice a year. Routine eye exams. Ask your health care provider how often you should have your eyes checked. Personal lifestyle choices, including: Daily care of your teeth and gums. Regular physical activity. Eating a healthy diet. Avoiding tobacco and drug use. Limiting alcohol use. Practicing safe sex. Taking low-dose aspirin every day. Taking vitamin and mineral supplements as recommended by your health care provider. What happens during an annual well check? The services and screenings done by your health care provider during your annual well check will depend on your age, overall health, lifestyle risk factors, and family history of disease. Counseling  Your health care provider may ask you questions about your: Alcohol use. Tobacco use. Drug use. Emotional well-being. Home and relationship  well-being. Sexual activity. Eating habits. History of falls. Memory and ability to understand (cognition). Work and work Statistician. Reproductive health. Screening  You may have the following tests or measurements: Height, weight, and BMI. Blood pressure. Lipid and cholesterol levels. These may be checked every 5 years, or more frequently if you are over 51 years old. Skin check. Lung cancer screening. You may have this screening every year starting at age 47 if you have a 30-pack-year history of smoking and currently smoke or have quit within the past 15 years. Fecal occult blood test (FOBT) of the stool. You may have this test every year starting at age 18. Flexible sigmoidoscopy or colonoscopy. You may have a sigmoidoscopy every 5 years or a colonoscopy every 10 years starting at age 83. Hepatitis C blood test. Hepatitis B blood test. Sexually transmitted disease (STD) testing. Diabetes screening. This is done by checking your blood sugar (glucose) after you have not eaten for a while (fasting). You may have this done every 1-3 years. Bone density scan. This is done to screen for osteoporosis. You may have this done starting at age 24. Mammogram. This may be done every 1-2 years. Talk to your health care provider about how often you should have regular mammograms. Talk with your health care provider about your test results, treatment options, and if necessary, the need for more tests. Vaccines  Your health care provider may recommend certain vaccines, such as: Influenza vaccine. This is recommended every year. Tetanus, diphtheria, and acellular pertussis (Tdap, Td) vaccine. You may need a Td booster every 10 years. Zoster vaccine. You may need this after age 37. Pneumococcal 13-valent conjugate (PCV13)  vaccine. One dose is recommended after age 49. Pneumococcal polysaccharide (PPSV23) vaccine. One dose is recommended after age 70. Talk to your health care provider about which  screenings and vaccines you need and how often you need them. This information is not intended to replace advice given to you by your health care provider. Make sure you discuss any questions you have with your health care provider. Document Released: 01/25/2015 Document Revised: 09/18/2015 Document Reviewed: 10/30/2014 Elsevier Interactive Patient Education  2017 [place] Prevention in the Home Falls can cause injuries. They can happen to people of all ages. There are many things you can do to make your home safe and to help prevent falls. What can I do on the outside of my home? Regularly fix the edges of walkways and driveways and fix any cracks. Remove anything that might make you trip as you walk through a door, such as a raised step or threshold. Trim any bushes or trees on the path to your home. Use bright outdoor lighting. Clear any walking paths of anything that might make someone trip, such as rocks or tools. Regularly check to see if handrails are loose or broken. Make sure that both sides of any steps have handrails. Any raised decks and porches should have guardrails on the edges. Have any leaves, snow, or ice cleared regularly. Use sand or salt on walking paths during winter. Clean up any spills in your garage right away. This includes oil or grease spills. What can I do in the bathroom? Use night lights. Install grab bars by the toilet and in the tub and shower. Do not use towel bars as grab bars. Use non-skid mats or decals in the tub or shower. If you need to sit down in the shower, use a plastic, non-slip stool. Keep the floor dry. Clean up any water that spills on the floor as soon as it happens. Remove soap buildup in the tub or shower regularly. Attach bath mats securely with double-sided non-slip rug tape. Do not have throw rugs and other things on the floor that can make you trip. What can I do in the bedroom? Use night lights. Make sure that you have a  light by your bed that is easy to reach. Do not use any sheets or blankets that are too big for your bed. They should not hang down onto the floor. Have a firm chair that has side arms. You can use this for support while you get dressed. Do not have throw rugs and other things on the floor that can make you trip. What can I do in the kitchen? Clean up any spills right away. Avoid walking on wet floors. Keep items that you use a lot in easy-to-reach places. If you need to reach something above you, use a strong step stool that has a grab bar. Keep electrical cords out of the way. Do not use floor polish or wax that makes floors slippery. If you must use wax, use non-skid floor wax. Do not have throw rugs and other things on the floor that can make you trip. What can I do with my stairs? Do not leave any items on the stairs. Make sure that there are handrails on both sides of the stairs and use them. Fix handrails that are broken or loose. Make sure that handrails are as long as the stairways. Check any carpeting to make sure that it is firmly attached to the stairs. Fix any carpet that is loose  or worn. Avoid having throw rugs at the top or bottom of the stairs. If you do have throw rugs, attach them to the floor with carpet tape. Make sure that you have a light switch at the top of the stairs and the bottom of the stairs. If you do not have them, ask someone to add them for you. What else can I do to help prevent falls? Wear shoes that: Do not have high heels. Have rubber bottoms. Are comfortable and fit you well. Are closed at the toe. Do not wear sandals. If you use a stepladder: Make sure that it is fully opened. Do not climb a closed stepladder. Make sure that both sides of the stepladder are locked into place. Ask someone to hold it for you, if possible. Clearly mark and make sure that you can see: Any grab bars or handrails. First and last steps. Where the edge of each step  is. Use tools that help you move around (mobility aids) if they are needed. These include: Canes. Walkers. Scooters. Crutches. Turn on the lights when you go into a dark area. Replace any light bulbs as soon as they burn out. Set up your furniture so you have a clear path. Avoid moving your furniture around. If any of your floors are uneven, fix them. If there are any pets around you, be aware of where they are. Review your medicines with your doctor. Some medicines can make you feel dizzy. This can increase your chance of falling. Ask your doctor what other things that you can do to help prevent falls. This information is not intended to replace advice given to you by your health care provider. Make sure you discuss any questions you have with your health care provider. Document Released: 10/25/2008 Document Revised: 06/06/2015 Document Reviewed: 02/02/2014 Elsevier Interactive Patient Education  2017 [employer].

## 2020-06-28 NOTE — Progress Notes (Signed)
This service is provided via telemedicine  No vital signs collected/recorded due to the encounter was a telemedicine visit.   Location of patient (ex: home, work):  Home  Patient consents to a telephone visit:  Yes, see encounter dated 06/28/2020  Location of the provider (ex: office, home):  [facility]  Name of any referring provider:  N/A  Names of all persons participating in the telemedicine service and their role in the encounter:  Marlowe Sax, NP, Carroll Kinds, CMA and patient.  Time spent on call:  11 minutes with medical assistant    Subjective:   Alison [name] is a 75 y.o. female who presents for Medicare Annual (Subsequent) preventive examination.  Review of Systems     Cardiac Risk Factors include: advanced age (>38men, >94 women);diabetes mellitus;dyslipidemia;obesity (BMI >30kg/m2);smoking/ tobacco exposure;hypertension     Objective:    Today's Vitals   06/28/20 0955  PainSc: 7    There is no height or weight on file to calculate BMI.  Advanced Directives 06/28/2020 06/21/2020 04/01/2020 03/25/2020 01/11/2020 09/07/2019 06/09/2019  Does Patient Have a Medical Advance Directive? No No No No No No No  Type of Advance Directive - - - - - - -  Does patient want to make changes to medical advance directive? - - - - - - No - Patient declined  Would patient like information on creating a medical advance directive? No - Patient declined No - Patient declined No - Patient declined No - Patient declined Yes (MAU/Ambulatory/Procedural Areas - Information given) No - Patient declined -  Pre-existing out of facility DNR order (yellow form or pink MOST form) - - - - - - -    Current Medications (verified) Outpatient Encounter Medications as of 06/28/2020  Medication Sig   acetaminophen (TYLENOL) 650 MG CR tablet Take 1,300 mg by mouth in the morning and at bedtime.   amLODipine (NORVASC) 5 MG tablet Take 1 tablet (5 mg total) by mouth daily.    aspirin EC 81 MG tablet Take 81 mg by mouth daily.   atorvastatin (LIPITOR) 40 MG tablet TAKE 1 TABLET BY MOUTH ONCE DAILY AT  6PM   Calcium Carb-Cholecalciferol (CALCIUM 600 + D PO) Take 1 tablet by mouth daily.   carboxymethylcellulose (REFRESH PLUS) 0.5 % SOLN Place 1 drop into both eyes 3 (three) times daily as needed (dry eyes).    celecoxib (CELEBREX) 200 MG capsule Take 200 mg by mouth daily.   Cholecalciferol (VITAMIN D-3) 1000 UNITS CAPS Take 1,000 Units by mouth daily.    COMBIGAN 0.2-0.5 % ophthalmic solution Place 1 drop into both eyes in the morning and at bedtime.    gabapentin (NEURONTIN) 300 MG capsule Take 1 capsule by mouth at bedtime   Homeopathic Products (THERAWORX RELIEF EX) Apply 1 application topically daily as needed (pain).   latanoprost (XALATAN) 0.005 % ophthalmic solution Place 1 drop into both eyes at bedtime.   lisinopril (ZESTRIL) 30 MG tablet Take 1 tablet (30 mg total) by mouth daily.   metFORMIN (GLUCOPHAGE) 500 MG tablet TAKE 1/2 (ONE-HALF) TABLET BY MOUTH IN THE EVENING WITH SUPPER   Multiple Vitamin (MULTIVITAMIN) tablet Take 1 tablet by mouth daily.   Omega-3 Fatty Acids (OMEGA 3 PO) Take 1,200 mg by mouth daily.   pantoprazole (PROTONIX) 40 MG tablet Take 1 tablet (40 mg total) by mouth daily.   trolamine salicylate (ASPERCREME) 10 % cream Apply 1 application topically as needed for muscle pain.   No facility-administered  encounter medications on file as of 06/28/2020.    Allergies (verified) Aspirin   History: Past Medical History:  Diagnosis Date   Anxiety    Benign essential hypertension    Blood transfusion without reported diagnosis    Cataract    removed bilateraly   Diabetes mellitus without complication ([place])    Diverticulitis of colon (without mention of hemorrhage)(562.11)    GERD (gastroesophageal reflux disease)    Goiter, specified as simple    Helicobacter pylori gastritis    Hyperlipidemia LDL goal < 100    Intrahepatic bile  duct dilation    Numbness    TOES RT FOOT and toes left foot   Obesity, unspecified    Osteoarthrosis, unspecified whether generalized or localized, lower leg    Osteopenia    Other specified disease of nail    FUNGUS OF FINGERNAILS   Pernicious anemia    Unspecified glaucoma(365.9)    Unspecified vitamin D deficiency    Wears dentures    Wears glasses    Past Surgical History:  Procedure Laterality Date   BACK SURGERY     CATARACT EXTRACTION     right eye 06/20/15 left eye 09/12/15   COLECTOMY  2009   COLONOSCOPY  09/06/2007   Dr Carol Ada   DILATION AND CURETTAGE OF UTERUS     ENDOSCOPIC RETROGRADE CHOLANGIOPANCREATOGRAPHY (ERCP) WITH PROPOFOL N/A 03/17/2019   Procedure: ENDOSCOPIC RETROGRADE CHOLANGIOPANCREATOGRAPHY (ERCP) WITH PROPOFOL;  Surgeon: Ladene Artist, MD;  Location: [facility] ENDOSCOPY;  Service: Endoscopy;  Laterality: N/A;   JOINT REPLACEMENT     right   LUMBAR LAMINECTOMY/DECOMPRESSION MICRODISCECTOMY N/A 03/30/2013   Procedure: CENTRAL DECOMPRESSION L4 - L5 AND EXCISION 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;  Surgeon: Tobi Bastos, MD;  Location: [facility] ORS;  Service: Orthopedics;  Laterality: N/A;   MULTIPLE TOOTH EXTRACTIONS     REMOVAL OF STONES  03/17/2019   Procedure: REMOVAL OF STONES;  Surgeon: Ladene Artist, MD;  Location: [place];  Service: Endoscopy;;   SIGMOIDOSCOPY  02/16/2018   Dr Nira Conn  03/17/2019   Procedure: SPHINCTEROTOMY;  Surgeon: Ladene Artist, MD;  Location: [place];  Service: Endoscopy;;   TOTAL KNEE ARTHROPLASTY Right 02/01/2013   Procedure: RIGHT TOTAL KNEE ARTHROPLASTY;  Surgeon: Tobi Bastos, MD;  Location: [facility];  Service: Orthopedics;  Laterality: Right;   TOTAL KNEE ARTHROPLASTY Left 04/18/2014   Procedure: LEFT TOTAL KNEE ARTHROPLASTY;  Surgeon: Latanya Maudlin, MD;  Location: [facility];  Service: Orthopedics;  Laterality: Left;   Family History  Problem Relation Age of Onset   Alzheimer's disease Mother    Stroke  Mother    Hyperlipidemia Mother    Heart disease Father    Diabetes Sister    Colon polyps Neg Hx    Colon cancer Neg Hx    Esophageal cancer Neg Hx    Rectal cancer Neg Hx    Stomach cancer Neg Hx    Social History   Socioeconomic History   Marital status: Divorced    Spouse name: Not on file   Number of children: Not on file   Years of education: Not on file   Highest education level: Not on file  Occupational History   Not on file  Tobacco Use   Smoking status: Former    Packs/day: 0.50    Years: 30.00    Pack years: 15.00    Types: Cigarettes    Quit date: 03/2019    Years since  quitting: 1.2   Smokeless tobacco: Never  Vaping Use   Vaping Use: Never used  Substance and Sexual Activity   Alcohol use: No   Drug use: No   Sexual activity: Not Currently  Other Topics Concern   Not on file  Social History Narrative   Not on file   Social Determinants of Health   Financial Resource Strain: Not on file  Food Insecurity: Not on file  Transportation Needs: Not on file  Physical Activity: Not on file  Stress: Not on file  Social Connections: Not on file    Tobacco Counseling Counseling given: Not Answered   Clinical Intake:  Pre-visit preparation completed: No  Pain Score: 7  Pain Type: Chronic pain Pain Location: Knee (hands.Neuropathy to feet) Pain Orientation: Left, Right Pain Radiating Towards: No Pain Descriptors / Indicators: Aching, Other (Comment) (stinging) Pain Onset:  (several years) Pain Frequency: Intermittent Pain Relieving Factors: Tylenol,celebrex,Topical gel,exercise Effect of Pain on Daily Activities: walking  Pain Relieving Factors: Tylenol,celebrex,Topical gel,exercise  BMI - recorded: 33.89 Nutritional Status: BMI > 30  Obese Nutritional Risks: None Diabetes: Yes CBG done?: No Did pt. bring in CBG monitor from home?: No  How often do you need to have someone help you when you read instructions, pamphlets, or other written  materials from your doctor or pharmacy?: 1 - Never What is the last grade level you completed in school?: 12 grade  Diabetic?Yes  Interpreter Needed?: No  Information entered by :: Loralai Eisman FNP-C   Activities of Daily Living In your present state of health, do you have any difficulty performing the following activities: 06/28/2020  Hearing? N  Vision? N  Difficulty concentrating or making decisions? Y  Comment forgetful sometimes  Walking or climbing stairs? Y  Comment knee pain  Dressing or bathing? N  Doing errands, shopping? N  Comment daughter Restaurant manager, fast food and eating ? N  Using the Toilet? N  In the past six months, have you accidently leaked urine? N  Do you have problems with loss of bowel control? N  Managing your Medications? N  Managing your Finances? N  Housekeeping or managing your Housekeeping? N  Some recent data might be hidden    Patient Care Team: Sarabeth Benton, Nelda Bucks, NP as PCP - General (Family Medicine) Shirley Muscat Loreen Freud, MD as Referring Physician (Optometry) Latanya Maudlin, MD as Consulting Physician (Orthopedic Surgery) Warden Fillers, MD as Consulting Physician (Ophthalmology) Ladene Artist, MD as Consulting Physician (Gastroenterology) Abbie Sons, MD (Urology)  Indicate any recent Medical Services you may have received from other than [facility] providers in the past year (date may be approximate).     Assessment:   This is a routine wellness examination for Alison [name].  Hearing/Vision screen Hearing Screening - Comments:: Patient has no hearing problems. Vision Screening - Comments:: Patient wears glasses. Patient had eye exam last month. Patient sees Dr. Warden Fillers  Dietary issues and exercise activities discussed: Current Exercise Habits: Home exercise routine, Type of exercise: walking (stationary Bike), Time (Minutes): 30, Frequency (Times/Week): 7, Weekly Exercise (Minutes/Week): 210, Intensity: Moderate, Exercise  limited by: Other - see comments (knee pain)   Goals Addressed             This Visit's Progress    Quit Smoking   On track    Patient will try to incorporate new activites and hobbies to help decrease smoking      Quit smoking / using tobacco   On track  Depression Screen PHQ 2/9 Scores 06/28/2020 01/11/2020 09/07/2019 06/09/2019 04/10/2019 03/10/2019 02/10/2019  PHQ - 2 Score 0 0 0 0 0 0 0  Exception Documentation - - - - - - -    Fall Risk Fall Risk  06/28/2020 06/21/2020 05/15/2020 04/01/2020 03/25/2020  Falls in the past year? 0 0 0 0 0  Number falls in past yr: 0 0 0 0 0  Injury with Fall? 0 0 - 0 0  Risk for fall due to : - - - - -    [place]:  Any stairs in or around the home? Yes  If so, are there any without handrails? Yes  Home free of loose throw rugs in walkways, pet beds, electrical cords, etc? No  Adequate lighting in your home to reduce risk of falls? Yes   ASSISTIVE DEVICES UTILIZED TO PREVENT FALLS:  Life alert? No  Use of a cane, walker or w/c? No  Grab bars in the bathroom? Yes  Shower chair or bench in shower? Yes  Elevated toilet seat or a handicapped toilet? No   TIMED UP AND GO:  Was the test performed? No .  Length of time to ambulate 10 feet: N/A sec.   Gait unsteady without use of assistive device, provider informed and interventions were implemented  Cognitive Function: MMSE - Mini Mental State Exam 04/26/2017 02/03/2016  Orientation to time 5 5  Orientation to Place 5 5  Registration 3 3  Attention/ Calculation 5 5  Recall 3 2  Language- name 2 objects 2 2  Language- repeat 1 1  Language- follow 3 step command 3 3  Language- read & follow direction 1 1  Write a sentence 1 1  Copy design 1 1  Total score 30 29     6CIT Screen 06/28/2020 06/09/2019 06/07/2018 06/07/2018  What Year? 0 points 0 points 0 points 0 points  What month? 0 points 0 points 0 points 0 points  What time? 0 points 0 points 0  points 0 points  Count back from 20 4 points 0 points 0 points 0 points  Months in reverse 0 points 0 points 0 points 0 points  Repeat phrase 6 points 6 points 0 points 0 points  Total Score 10 6 0 0    Immunizations Immunization History  Administered Date(s) Administered   Fluad Quad(high Dose 65+) 09/05/2018   Influenza, High Dose Seasonal PF 10/02/2017, 11/20/2019   Influenza,inj,Quad PF,6+ Mos 10/03/2012, 09/21/2014, 11/07/2015   Influenza,inj,quad, With Preservative 11/12/2016   Influenza-Unspecified 10/28/2013   PFIZER(Purple Top)SARS-COV-2 Vaccination 03/25/2019, 03/25/2019, 04/18/2019, 11/01/2019, 05/23/2020   Pneumococcal Conjugate-13 01/04/2014, 01/04/2014, 11/04/2016   Pneumococcal Polysaccharide-23 12/29/2012, 11/19/2017   Zoster Recombinat (Shingrix) 11/25/2016, 01/26/2017    TDAP status: Up to date  Flu Vaccine status: Up to date  Pneumococcal vaccine status: Up to date  Covid-19 vaccine status: Completed vaccines  Qualifies for Shingles Vaccine? Yes   Zostavax completed Yes   Shingrix Completed?: Yes  Screening Tests Health Maintenance  Topic Date Due   OPHTHALMOLOGY EXAM  05/15/2020   INFLUENZA VACCINE  08/12/2020   COVID-19 Vaccine (5 - Booster for Pfizer series) 09/23/2020   HEMOGLOBIN A1C  11/13/2020   FOOT EXAM  01/10/2021   COLONOSCOPY (Pts 45-24yrs Insurance coverage will need to be confirmed)  02/17/2023   TETANUS/TDAP  10/29/2023   DEXA SCAN  Completed   Hepatitis C Screening  Completed   PNA vac Low Risk Adult  Completed  Zoster Vaccines- Shingrix  Completed   HPV VACCINES  Aged Out    Health Maintenance  Health Maintenance Due  Topic Date Due   OPHTHALMOLOGY EXAM  05/15/2020    Colorectal cancer screening: Type of screening: Sigmoidoscopy. Completed 2/5/20220. Repeat every 5 years  Mammogram status: Completed 07/2019. Repeat every year  Bone Density status: Completed 09/08/2018. Results reflect: Bone density results: NORMAL. Repeat  every 2 years.  Lung Cancer Screening: (Low Dose CT Chest recommended if Age 47-80 years, 30 pack-year currently smoking OR have quit w/in 15years.) does qualify.   Lung Cancer Screening Referral: done 09/20/2018  Additional Screening:  Hepatitis C Screening: does not qualify; Completed yes  Vision Screening: Recommended annual ophthalmology exams for early detection of glaucoma and other disorders of the eye. Is the patient up to date with their annual eye exam?  Yes  Who is the provider or what is the name of the office in which the patient attends annual eye exams? Dr.Groat  If pt is not established with a provider, would they like to be referred to a provider to establish care? No .   Dental Screening: Recommended annual dental exams for proper oral hygiene  Community Resource Referral / Chronic Care Management: CRR required this visit?  No   CCM required this visit?  No      Plan:     I have personally reviewed and noted the following in the patient's chart:   Medical and social history Use of alcohol, tobacco or illicit drugs  Current medications and supplements including opioid prescriptions.  Functional ability and status Nutritional status Physical activity Advanced directives List of other physicians Hospitalizations, surgeries, and ER visits in previous 12 months Vitals Screenings to include cognitive, depression, and falls Referrals and appointments  In addition, I have reviewed and discussed with patient certain preventive protocols, quality metrics, and best practice recommendations. A written personalized care plan for preventive services as well as general preventive health recommendations were provided to patient.     Sandrea Hughs, NP   06/28/2020   Nurse Notes: Up to date

## 2020-06-28 NOTE — Telephone Encounter (Signed)
Ms. annelies, coyt are scheduled for a virtual visit with your provider today.    Just as we do with appointments in the office, we must obtain your consent to participate.  Your consent will be active for this visit and any virtual visit you may have with one of our providers in the next 365 days.    If you have a MyChart account, I can also send a copy of this consent to you electronically.  All virtual visits are billed to your insurance company just like a traditional visit in the office.  As this is a virtual visit, video technology does not allow for your provider to perform a traditional examination.  This may limit your provider's ability to fully assess your condition.  If your provider identifies any concerns that need to be evaluated in person or the need to arrange testing such as labs, EKG, etc, we will make arrangements to do so.    Although advances in technology are sophisticated, we cannot ensure that it will always work on either your end or our end.  If the connection with a video visit is poor, we may have to switch to a telephone visit.  With either a video or telephone visit, we are not always able to ensure that we have a secure connection.   I need to obtain your verbal consent now.   Are you willing to proceed with your visit today?   Rease Swinson has provided verbal consent on 06/28/2020 for a virtual visit (video or telephone).   Carroll Kinds, [facility] 06/28/2020  9:33 AM

## 2020-07-08 ENCOUNTER — Other Ambulatory Visit: Payer: Self-pay | Admitting: *Deleted

## 2020-07-08 DIAGNOSIS — I10 Essential (primary) hypertension: Secondary | ICD-10-CM

## 2020-07-08 MED ORDER — LISINOPRIL 30 MG PO TABS: 30.0000 mg | ORAL_TABLET | Freq: Every day | ORAL | 1 refills | Status: DC

## 2020-07-08 NOTE — Telephone Encounter (Signed)
Pharmacy requested refill

## 2020-07-17 ENCOUNTER — Ambulatory Visit (INDEPENDENT_AMBULATORY_CARE_PROVIDER_SITE_OTHER): Payer: Medicare Other | Admitting: Podiatry

## 2020-07-17 ENCOUNTER — Other Ambulatory Visit: Payer: Self-pay

## 2020-07-17 ENCOUNTER — Encounter: Payer: Self-pay | Admitting: Podiatry

## 2020-07-17 DIAGNOSIS — M79674 Pain in right toe(s): Secondary | ICD-10-CM

## 2020-07-17 DIAGNOSIS — B351 Tinea unguium: Secondary | ICD-10-CM

## 2020-07-17 DIAGNOSIS — E1149 Type 2 diabetes mellitus with other diabetic neurological complication: Secondary | ICD-10-CM | POA: Diagnosis not present

## 2020-07-17 DIAGNOSIS — M79675 Pain in left toe(s): Secondary | ICD-10-CM

## 2020-07-18 ENCOUNTER — Encounter: Payer: Self-pay | Admitting: Podiatry

## 2020-07-18 NOTE — Progress Notes (Signed)
  Subjective:  Patient ID: Alison [name], female    DOB: [birth year],  MRN: [id number]  Chief Complaint  Patient presents with   Nail Problem    Nail trim    75 y.o. female returns for the above complaint. Patient presents with thickened elongated dystrophic toenails x10. Patient is a diabetic with last A1c of 6.1.  She would like to have them debrided down.  She is not able to do it herself.  They are painful with ambulation Objective:  There were no vitals filed for this visit. Podiatric Exam: Vascular: dorsalis pedis and posterior tibial pulses are palpable bilateral. Capillary return is immediate. Temperature gradient is WNL. Skin turgor WNL  Sensorium: Decreased Semmes Weinstein monofilament test. Decreased tactile sensation bilaterally. Nail Exam: Pt has thick disfigured discolored nails with subungual debris noted bilateral entire nail hallux through fifth toenails.  Pain on palpation to the nails. Ulcer Exam: There is no evidence of ulcer or pre-ulcerative changes or infection. Orthopedic Exam: Muscle tone and strength are WNL. No limitations in general ROM. No crepitus or effusions noted. HAV  B/L.  Hammer toes 2-5  B/L. Skin: No Porokeratosis. No infection or ulcers mild lower extremity edema nonpitting    Assessment & Plan:   1. Type 2 diabetes mellitus with neurological complications (HCC)   2. Pain due to onychomycosis of toenails of both feet      Patient was evaluated and treated and all questions answered.  Bilateral lower extremity edema nonpitting -I explained to the patient the etiology of edema and various treatment options were discussed.  I believe patient will benefit from compression socks as well as aggressive elevation especially if she has been in a dependent position long period of time.  Patient states understanding.  She will start working on that.  Onychomycosis with pain  -Nails palliatively debrided as below. -Educated on self-care  Procedure:  Nail Debridement Rationale: pain  Type of Debridement: manual, sharp debridement. Instrumentation: Nail nipper, rotary burr. Number of Nails: 10  Procedures and Treatment: Consent by patient was obtained for treatment procedures. The patient understood the discussion of treatment and procedures well. All questions were answered thoroughly reviewed. Debridement of mycotic and hypertrophic toenails, 1 through 5 bilateral and clearing of subungual debris. No ulceration, no infection noted.  Return Visit-Office Procedure: Patient instructed to return to the office for a follow up visit 3 months for continued evaluation and treatment.  Boneta Lucks, DPM    Return in about 6 months (around 01/17/2021).

## 2020-07-24 ENCOUNTER — Other Ambulatory Visit: Payer: Self-pay | Admitting: Gastroenterology

## 2020-07-25 ENCOUNTER — Other Ambulatory Visit: Payer: Self-pay | Admitting: Family

## 2020-07-25 DIAGNOSIS — E1149 Type 2 diabetes mellitus with other diabetic neurological complication: Secondary | ICD-10-CM

## 2020-07-26 MED ORDER — GABAPENTIN 300 MG PO CAPS: 300.0000 mg | ORAL_CAPSULE | Freq: Every day | ORAL | 1 refills | Status: DC

## 2020-07-26 NOTE — Telephone Encounter (Signed)
Patient called and stated that her Gabapentin was only refilled for #12.  Patient is wondering why.  Is it ok to refill for a 90 day supply.  Please Advise.

## 2020-07-26 NOTE — Telephone Encounter (Signed)
Okay to refill gabapentin for 90 day supply.

## 2020-07-26 NOTE — Telephone Encounter (Signed)
Rx sent to pharmacy   

## 2020-07-26 NOTE — Addendum Note (Signed)
Addended by: Rafael Bihari A on: 07/26/2020 04:29 PM   Modules accepted: Orders

## 2020-07-29 ENCOUNTER — Ambulatory Visit
Admission: RE | Admit: 2020-07-29 | Discharge: 2020-07-29 | Disposition: A | Discharge status: Home / Self Care | Payer: Medicare Other | Source: Ambulatory Visit | Attending: Family | Admitting: Family

## 2020-07-29 ENCOUNTER — Ambulatory Visit (INDEPENDENT_AMBULATORY_CARE_PROVIDER_SITE_OTHER): Payer: Medicare Other | Admitting: Family

## 2020-07-29 ENCOUNTER — Encounter: Payer: Self-pay | Admitting: Family

## 2020-07-29 ENCOUNTER — Other Ambulatory Visit: Payer: Self-pay

## 2020-07-29 VITALS — BP 126/84 | HR 84 | Temp 97.5°F | Resp 16 | Ht 66.0 in | Wt 195.4 lb

## 2020-07-29 DIAGNOSIS — R002 Palpitations: Secondary | ICD-10-CM

## 2020-07-29 DIAGNOSIS — R0609 Other forms of dyspnea: Secondary | ICD-10-CM

## 2020-07-29 DIAGNOSIS — R0600 Dyspnea, unspecified: Secondary | ICD-10-CM | POA: Diagnosis not present

## 2020-07-29 DIAGNOSIS — R059 Cough, unspecified: Secondary | ICD-10-CM
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
## 2020-07-29 NOTE — Patient Instructions (Signed)
-   Notify provider or go to ED if symptoms worsen or not resolve   - Please get a chest X-ray done at [place] at [facility] then will call you with results.

## 2020-08-21 DIAGNOSIS — Z1231 Encounter for screening mammogram for malignant neoplasm of breast: Secondary | ICD-10-CM | POA: Diagnosis not present

## 2020-08-21 DIAGNOSIS — Z803 Family history of malignant neoplasm of breast: Secondary | ICD-10-CM | POA: Diagnosis not present

## 2020-09-23 ENCOUNTER — Other Ambulatory Visit: Payer: Medicare Other

## 2020-09-23 ENCOUNTER — Other Ambulatory Visit: Payer: Self-pay

## 2020-09-23 DIAGNOSIS — H401131 Primary open-angle glaucoma, bilateral, mild stage: Secondary | ICD-10-CM | POA: Diagnosis not present

## 2020-09-23 DIAGNOSIS — E1149 Type 2 diabetes mellitus with other diabetic neurological complication: Secondary | ICD-10-CM

## 2020-09-23 DIAGNOSIS — I10 Essential (primary) hypertension: Secondary | ICD-10-CM | POA: Diagnosis not present

## 2020-09-23 DIAGNOSIS — E871 Hypo-osmolality and hyponatremia: Secondary | ICD-10-CM

## 2020-09-23 DIAGNOSIS — H26493 Other secondary cataract, bilateral: Secondary | ICD-10-CM | POA: Diagnosis not present

## 2020-09-23 DIAGNOSIS — E1169 Type 2 diabetes mellitus with other specified complication: Secondary | ICD-10-CM | POA: Diagnosis not present

## 2020-09-23 DIAGNOSIS — H43813 Vitreous degeneration, bilateral: Secondary | ICD-10-CM | POA: Diagnosis not present

## 2020-09-23 DIAGNOSIS — Z961 Presence of intraocular lens: Secondary | ICD-10-CM | POA: Diagnosis not present

## 2020-09-23 DIAGNOSIS — E785 Hyperlipidemia, unspecified: Secondary | ICD-10-CM | POA: Diagnosis not present

## 2020-09-23 DIAGNOSIS — E119 Type 2 diabetes mellitus without complications: Secondary | ICD-10-CM | POA: Diagnosis not present

## 2020-09-23 DIAGNOSIS — H35033 Hypertensive retinopathy, bilateral: Secondary | ICD-10-CM | POA: Diagnosis not present

## 2020-09-24 LAB — CBC WITH DIFFERENTIAL/PLATELET
Absolute Monocytes: 637 cells/uL (ref 200–950)
Basophils Absolute: 46 cells/uL (ref 0–200)
Basophils Relative: 0.5 %
Eosinophils Absolute: 182 cells/uL (ref 15–500)
Eosinophils Relative: 2 %
HCT: 35.3 % (ref 35.0–45.0)
Hemoglobin: 11.3 g/dL — ABNORMAL LOW (ref 11.7–15.5)
Lymphs Abs: 2239 cells/uL (ref 850–3900)
MCH: 30 pg (ref 27.0–33.0)
MCHC: 32 g/dL (ref 32.0–36.0)
MCV: 93.6 fL (ref 80.0–100.0)
MPV: 10.2 fL (ref 7.5–12.5)
Monocytes Relative: 7 %
Neutro Abs: 5997 cells/uL (ref 1500–7800)
Neutrophils Relative %: 65.9 %
Platelets: 360 10*3/uL (ref 140–400)
RBC: 3.77 10*6/uL — ABNORMAL LOW (ref 3.80–5.10)
RDW: 12.4 % (ref 11.0–15.0)
Total Lymphocyte: 24.6 %
WBC: 9.1 10*3/uL (ref 3.8–10.8)

## 2020-09-24 LAB — LIPID PANEL
Cholesterol: 122 mg/dL (ref <200)
HDL: 38 mg/dL — ABNORMAL LOW (ref >=50)
LDL Cholesterol (Calc): 64 mg/dL (calc)
Non-HDL Cholesterol (Calc): 84 mg/dL (calc) (ref <130)
Total CHOL/HDL Ratio: 3.2 (calc) (ref <5.0)
Triglycerides: 113 mg/dL (ref <150)

## 2020-09-24 LAB — COMPREHENSIVE METABOLIC PANEL
AG Ratio: 1.3 (calc) (ref 1.0–2.5)
ALT: 13 U/L (ref 6–29)
AST: 16 U/L (ref 10–35)
Albumin: 4.3 g/dL (ref 3.6–5.1)
Alkaline phosphatase (APISO): 68 U/L (ref 37–153)
BUN/Creatinine Ratio: 13 (calc) (ref 6–22)
BUN: 14 mg/dL (ref 7–25)
CO2: 27 mmol/L (ref 20–32)
Calcium: 10.2 mg/dL (ref 8.6–10.4)
Chloride: 103 mmol/L (ref 98–110)
Creat: 1.05 mg/dL — ABNORMAL HIGH (ref 0.60–1.00)
Globulin: 3.2 g/dL (calc) (ref 1.9–3.7)
Glucose, Bld: 95 mg/dL (ref 65–99)
Potassium: 4.5 mmol/L (ref 3.5–5.3)
Sodium: 138 mmol/L (ref 135–146)
Total Bilirubin: 0.6 mg/dL (ref 0.2–1.2)
Total Protein: 7.5 g/dL (ref 6.1–8.1)

## 2020-09-24 LAB — HEMOGLOBIN A1C
Hgb A1c MFr Bld: 5.8 % of total Hgb — ABNORMAL HIGH (ref <5.7)
Mean Plasma Glucose: 120 mg/dL
eAG (mmol/L): 6.6 mmol/L

## 2020-09-26 ENCOUNTER — Other Ambulatory Visit: Payer: Self-pay

## 2020-09-26 ENCOUNTER — Encounter: Payer: Self-pay | Admitting: Family

## 2020-09-26 ENCOUNTER — Ambulatory Visit (INDEPENDENT_AMBULATORY_CARE_PROVIDER_SITE_OTHER): Payer: Medicare Other | Admitting: Family

## 2020-09-26 VITALS — BP 118/70 | HR 58 | Temp 96.9°F | Resp 16 | Ht 66.0 in | Wt 193.0 lb

## 2020-09-26 DIAGNOSIS — L989 Disorder of the skin and subcutaneous tissue, unspecified: Secondary | ICD-10-CM | POA: Diagnosis not present

## 2020-09-26 DIAGNOSIS — I10 Essential (primary) hypertension: Secondary | ICD-10-CM | POA: Diagnosis not present

## 2020-09-26 DIAGNOSIS — R2681 Unsteadiness on feet: Secondary | ICD-10-CM | POA: Diagnosis not present

## 2020-09-26 DIAGNOSIS — E1169 Type 2 diabetes mellitus with other specified complication: Secondary | ICD-10-CM

## 2020-09-26 DIAGNOSIS — E785 Hyperlipidemia, unspecified: Secondary | ICD-10-CM

## 2020-09-26 DIAGNOSIS — E1149 Type 2 diabetes mellitus with other diabetic neurological complication: Secondary | ICD-10-CM | POA: Diagnosis not present

## 2020-09-26 DIAGNOSIS — K219 Gastro-esophageal reflux disease without esophagitis: Secondary | ICD-10-CM | POA: Diagnosis not present

## 2020-09-26 DIAGNOSIS — Z23 Encounter for immunization: Secondary | ICD-10-CM | POA: Diagnosis not present

## 2020-09-26 NOTE — Patient Instructions (Signed)

## 2020-09-26 NOTE — Progress Notes (Signed)
Provider: Marlowe Sax FNP-C   Makylee Sanborn, Nelda Bucks, NP  Patient Care Team: Grizelda Piscopo, Nelda Bucks, NP as PCP - General (Family Medicine) Shirley Muscat Loreen Freud, MD as Referring Physician (Optometry) Latanya Maudlin, MD as Consulting Physician (Orthopedic Surgery) Warden Fillers, MD as Consulting Physician (Ophthalmology) Ladene Artist, MD as Consulting Physician (Gastroenterology) Abbie Sons, MD (Urology)  Extended Emergency Contact Information Primary Emergency Contact: Bowman,Lillie Address: [address]           [place], [place] [address] [name] of [place] Phone: [phone] Relation: Sister Secondary Emergency Contact: Koenig,[name] Address: [address]          [place],  [address] [name] of [place] Phone: [phone] Mobile Phone: [phone] Relation: Daughter  Code Status:  Full Code  Goals of care: Advanced Directive information Advanced Directives 09/26/2020  Does Patient Have a Medical Advance Directive? No  Type of Advance Directive -  Does patient want to make changes to medical advance directive? -  Would patient like information on creating a medical advance directive? No - Patient declined  Pre-existing out of facility DNR order (yellow form or pink MOST form) -     Chief Complaint  Patient presents with   Medical Management of Chronic Issues    4 month follow up.    Health Maintenance    Discuss the need for eye exam.    Immunizations    Discuss the need for influenza vaccine, and 3rd covid booster.    HPI:  Pt is a 75 y.o. female seen today for 4 months follow up for medical management of chronic diseases.Has a medical history of Hypertension,Hyperlipidemia,Generalized anxiety,Type 2 DM,GERD,Goiter, Osteoarthritis,Osteopenia,Obesity among others. She states no acute issues this visit. Her recent lab work reviewed and discussed during visit.  Hgb 11.3 no dark stool or blood in stool reported  CR slightly up 1.05    Cholesterol,TRG and LDL are normal except HDL slightly 38 exercise by riding stationary bike twice a week.states will increase exercise and walking.   Her Hgb A1C has improved from 6.0 down to 5.8 no symptoms of hypoglycemia reported.No home CBG for review.  Recently seen by Ophthalmology 09/23/2020 no record. Has appointment with podiatrist in January,2023. Denies any numbness or tingling on fingers or feet.   Past Medical History:  Diagnosis Date   Anxiety    Benign essential hypertension    Blood transfusion without reported diagnosis    Cataract    removed bilateraly   Diabetes mellitus without complication ([place])    Diverticulitis of colon (without mention of hemorrhage)(562.11)    GERD (gastroesophageal reflux disease)    Goiter, specified as simple    Helicobacter pylori gastritis    Hyperlipidemia LDL goal < 100    Intrahepatic bile duct dilation    Numbness    TOES RT FOOT and toes left foot   Obesity, unspecified    Osteoarthrosis, unspecified whether generalized or localized, lower leg    Osteopenia    Other specified disease of nail    FUNGUS OF FINGERNAILS   Pernicious anemia    Unspecified glaucoma(365.9)    Unspecified vitamin D deficiency    Wears dentures    Wears glasses    Past Surgical History:  Procedure Laterality Date   BACK SURGERY     CATARACT EXTRACTION     right eye 06/20/15 left eye 09/12/15   COLECTOMY  2009   COLONOSCOPY  09/06/2007   Dr Carol Ada   DILATION AND  CURETTAGE OF UTERUS     ENDOSCOPIC RETROGRADE CHOLANGIOPANCREATOGRAPHY (ERCP) WITH PROPOFOL N/A 03/17/2019   Procedure: ENDOSCOPIC RETROGRADE CHOLANGIOPANCREATOGRAPHY (ERCP) WITH PROPOFOL;  Surgeon: Ladene Artist, MD;  Location: [facility] ENDOSCOPY;  Service: Endoscopy;  Laterality: N/A;   JOINT REPLACEMENT     right   LUMBAR LAMINECTOMY/DECOMPRESSION MICRODISCECTOMY N/A 03/30/2013   Procedure: CENTRAL DECOMPRESSION L4 - L5 AND EXCISION 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;  Surgeon: Tobi Bastos, MD;  Location: [facility] ORS;  Service: Orthopedics;  Laterality: N/A;   MULTIPLE TOOTH EXTRACTIONS     REMOVAL OF STONES  03/17/2019   Procedure: REMOVAL OF STONES;  Surgeon: Ladene Artist, MD;  Location: [place];  Service: Endoscopy;;   SIGMOIDOSCOPY  02/16/2018   Dr Nira Conn  03/17/2019   Procedure: SPHINCTEROTOMY;  Surgeon: Ladene Artist, MD;  Location: [place];  Service: Endoscopy;;   TOTAL KNEE ARTHROPLASTY Right 02/01/2013   Procedure: RIGHT TOTAL KNEE ARTHROPLASTY;  Surgeon: Tobi Bastos, MD;  Location: [facility];  Service: Orthopedics;  Laterality: Right;   TOTAL KNEE ARTHROPLASTY Left 04/18/2014   Procedure: LEFT TOTAL KNEE ARTHROPLASTY;  Surgeon: Latanya Maudlin, MD;  Location: [facility];  Service: Orthopedics;  Laterality: Left;    Allergies  Allergen Reactions   Aspirin Nausea Only    Can take 81 mg but can't take 352m, hard on stomach.    Allergies as of 09/26/2020       Reactions   Aspirin Nausea Only   Can take 81 mg but can't take 3259m hard on stomach.        Medication List        Accurate as of September 26, 2020  9:43 AM. If you have any questions, ask your nurse or doctor.          STOP taking these medications    Combigan 0.2-0.5 % ophthalmic solution Generic drug: brimonidine-timolol Stopped by: DiSandrea HughsNP       TAKE these medications    acetaminophen 650 MG CR tablet Commonly known as: TYLENOL Take 1,300 mg by mouth in the morning and at bedtime.   amLODipine 5 MG tablet Commonly known as: NORVASC Take 1 tablet (5 mg total) by mouth daily.   aspirin EC 81 MG tablet Take 81 mg by mouth daily.   atorvastatin 40 MG tablet Commonly known as: LIPITOR TAKE 1 TABLET BY MOUTH ONCE DAILY AT  6PM   CALCIUM 600 + D PO Take 1 tablet by mouth daily.   carboxymethylcellulose 0.5 % Soln Commonly known as: REFRESH PLUS Place 1 drop into both eyes 3 (three) times daily as needed (dry eyes).   celecoxib 200  MG capsule Commonly known as: CELEBREX Take 200 mg by mouth daily.   dorzolamide-timolol 22.3-6.8 MG/ML ophthalmic solution Commonly known as: COSOPT Place 1 drop into both eyes 2 (two) times daily.   gabapentin 300 MG capsule Commonly known as: NEURONTIN Take 1 capsule (300 mg total) by mouth at bedtime.   latanoprost 0.005 % ophthalmic solution Commonly known as: XALATAN Place 1 drop into both eyes at bedtime.   lisinopril 30 MG tablet Commonly known as: ZESTRIL Take 1 tablet (30 mg total) by mouth daily.   metFORMIN 500 MG tablet Commonly known as: GLUCOPHAGE TAKE 1/2 (ONE-HALF) TABLET BY MOUTH IN THE EVENING WITH SUPPER   multivitamin tablet Take 1 tablet by mouth daily.   OMEGA 3 PO Take 1,200 mg by mouth daily.   pantoprazole 40 MG  tablet Commonly known as: PROTONIX Take 1 tablet by mouth once daily   THERAWORX RELIEF EX Apply 1 application topically daily as needed (pain).   trolamine salicylate 10 % cream Commonly known as: ASPERCREME Apply 1 application topically as needed for muscle pain.   Vitamin D-3 25 MCG (1000 UT) Caps Take 1,000 Units by mouth daily.        Review of Systems  Constitutional:  Negative for appetite change, chills, fatigue, fever and unexpected weight change.  HENT:  Negative for congestion, dental problem, ear discharge, ear pain, facial swelling, hearing loss, nosebleeds, postnasal drip, rhinorrhea, sinus pressure, sinus pain, sneezing, sore throat, tinnitus and trouble swallowing.   Eyes:  Positive for visual disturbance. Negative for pain, discharge, redness and itching.       Wears eye glasses recently seen by Dr.Groat   Respiratory:  Negative for cough, chest tightness, shortness of breath and wheezing.   Cardiovascular:  Negative for chest pain, palpitations and leg swelling.  Gastrointestinal:  Negative for abdominal distention, abdominal pain, blood in stool, constipation, diarrhea, nausea and vomiting.  Endocrine:  Negative for cold intolerance, heat intolerance, polydipsia, polyphagia and polyuria.  Genitourinary:  Negative for difficulty urinating, dysuria, flank pain, frequency and urgency.  Musculoskeletal:  Positive for arthralgias and gait problem. Negative for back pain, joint swelling, myalgias, neck pain and neck stiffness.  Skin:  Negative for color change, pallor, rash and wound.  Neurological:  Positive for numbness. Negative for dizziness, syncope, speech difficulty, weakness, light-headedness and headaches.       Chronic numbness and tingling on feet   Hematological:  Does not bruise/bleed easily.  Psychiatric/Behavioral:  Negative for agitation, behavioral problems, confusion, hallucinations, self-injury, sleep disturbance and suicidal ideas. The patient is not nervous/anxious.    Immunization History  Administered Date(s) Administered   Fluad Quad(high Dose 65+) 09/05/2018   Influenza, High Dose Seasonal PF 10/02/2017, 11/20/2019   Influenza,inj,Quad PF,6+ Mos 10/03/2012, 09/21/2014, 11/07/2015   Influenza,inj,quad, With Preservative 11/12/2016   Influenza-Unspecified 10/28/2013   PFIZER(Purple Top)SARS-COV-2 Vaccination 03/25/2019, 03/25/2019, 04/18/2019, 11/01/2019, 05/23/2020   Pneumococcal Conjugate-13 01/04/2014, 01/04/2014, 11/04/2016   Pneumococcal Polysaccharide-23 12/29/2012, 11/19/2017   Zoster Recombinat (Shingrix) 11/25/2016, 01/26/2017   Pertinent  Health Maintenance Due  Topic Date Due   OPHTHALMOLOGY EXAM  05/15/2020   INFLUENZA VACCINE  08/12/2020   FOOT EXAM  01/10/2021   HEMOGLOBIN A1C  03/23/2021   COLONOSCOPY (Pts 45-56yr Insurance coverage will need to be confirmed)  02/17/2023   DEXA SCAN  Completed   PNA vac Low Risk Adult  Completed   Fall Risk  09/26/2020 07/29/2020 06/28/2020 06/21/2020 05/15/2020  Falls in the past year? 0 0 0 0 0  Number falls in past yr: 0 0 0 0 0  Injury with Fall? 0 0 0 0 -  Risk for fall due to : No Fall Risks No Fall Risks - - -   Follow up Falls evaluation completed Falls evaluation completed - - -   Functional Status Survey:    Vitals:   09/26/20 0936  BP: 118/70  Pulse: (!) 58  Resp: 16  Temp: (!) 96.9 F (36.1 C)  SpO2: 93%  Weight: 193 lb (87.5 kg)  Height: 5' 6" (1.676 m)   Body mass index is 31.15 kg/m. Physical Exam Vitals reviewed.  Constitutional:      General: She is not in acute distress.    Appearance: Normal appearance. She is normal weight. She is not ill-appearing or diaphoretic.  HENT:  Head: Normocephalic.     Right Ear: Tympanic membrane, ear canal and external ear normal. There is no impacted cerumen.     Left Ear: Tympanic membrane, ear canal and external ear normal. There is no impacted cerumen.     Nose: Nose normal. No congestion or rhinorrhea.     Mouth/Throat:     Mouth: Mucous membranes are moist.     Pharynx: Oropharynx is clear. No oropharyngeal exudate or posterior oropharyngeal erythema.  Eyes:     General: No scleral icterus.       Right eye: No discharge.        Left eye: No discharge.     Extraocular Movements: Extraocular movements intact.     Conjunctiva/sclera: Conjunctivae normal.     Pupils: Pupils are equal, round, and reactive to light.     Comments: Corrective lens on during visit   Neck:     Vascular: No carotid bruit.  Cardiovascular:     Rate and Rhythm: Normal rate and regular rhythm.     Pulses: Normal pulses.     Heart sounds: Normal heart sounds. No murmur heard.   No friction rub. No gallop.  Pulmonary:     Effort: Pulmonary effort is normal. No respiratory distress.     Breath sounds: Normal breath sounds. No wheezing, rhonchi or rales.  Chest:     Chest wall: No tenderness.  Abdominal:     General: Bowel sounds are normal. There is no distension.     Palpations: Abdomen is soft. There is no mass.     Tenderness: There is no abdominal tenderness. There is no right CVA tenderness, left CVA tenderness, guarding or rebound.   Musculoskeletal:        General: No swelling or tenderness. Normal range of motion.     Cervical back: Normal range of motion. No rigidity or tenderness.     Right lower leg: No edema.     Left lower leg: No edema.  Lymphadenopathy:     Cervical: No cervical adenopathy.  Skin:    General: Skin is warm and dry.     Coloration: Skin is not pale.     Findings: No bruising, erythema, lesion or rash.  Neurological:     Mental Status: She is alert and oriented to person, place, and time.     Cranial Nerves: No cranial nerve deficit.     Sensory: No sensory deficit.     Motor: No weakness.     Coordination: Coordination normal.     Gait: Gait abnormal.  Psychiatric:        Mood and Affect: Mood normal.        Speech: Speech normal.        Behavior: Behavior normal.        Thought Content: Thought content normal.        Judgment: Judgment normal.    Labs reviewed: Recent Labs    03/25/20 1101 05/13/20 1111 09/23/20 0850  NA 136 136 138  K 4.4 4.4 4.5  CL 103 101 103  CO2 _0 GLUCOSE 119 98 95  BUN _1 CREATININE 0.94* 0.83 1.05*  CALCIUM 9.8 9.9 10.2   Recent Labs    03/25/20 1101 05/13/20 1111 09/23/20 0850  AST _2 ALT _3 BILITOT 0.7 0.5 0.6  PROT 7.7 7.9 7.5   Recent Labs    03/25/20 1101 05/13/20 1111 09/23/20 0850  WBC 9.1 8.0 9.1  NEUTROABS 6,161 4,984 5,997  HGB 12.9 12.2 11.3*  HCT 39.7 37.7 35.3  MCV 89.4 91.1 93.6  PLT 324 322 360   Lab Results  Component Value Date   TSH 0.48 09/04/2019   Lab Results  Component Value Date   HGBA1C 5.8 (H) 09/23/2020   Lab Results  Component Value Date   CHOL 122 09/23/2020   HDL 38 (L) 09/23/2020   LDLCALC 64 09/23/2020   TRIG 113 09/23/2020   CHOLHDL 3.2 09/23/2020    Significant Diagnostic Results in last 30 days:  No results found.  Assessment/Plan 1. Type 2 diabetes mellitus with neurological complications [facility]) Lab Results  Component Value Date   HGBA1C 5.8 (H)  09/23/2020  No home CBG for review.A1C has improved compared to previous. Continue on metformin 250 mg tablet daily  - On ASA and Statin  - continue on ACE inhibitor - on Gabapentin for Neuropathy  Annual eye exam up to date.Has upcoming appointment with podiatrist - TSH; Future - Hemoglobin A1c; Future  2. Hyperlipidemia associated with type 2 diabetes mellitus (HCC) LDL at goal.HDL low encouraged to increase exercise.  Continue with dietary modification.  - continue on atorvastatin no muscle aches or weakness reported. - Lipid panel; Future  3. Benign essential hypertension B/p well controlled  Continue on Lisinopril and amlodipine  - CBC with Differential/Platelet; Future - CMP with eGFR([facility]); Future  4. Gastroesophageal reflux disease without esophagitis H/H stable  Continue on Protonix   5. Unsteady gait No recent fall episode Walks with a cane  - fall and safety precaution advised   6. Skin lesion of face Left face skin lesion has increased in size.Previously removed by dermatology but recurred. Benign.  - Ambulatory referral to Dermatology  7. Need for influenza vaccination Afebrile  Flut shot administered by CMA no acute reaction reported.  - Flu Vaccine QUAD High Dose(Fluad)  Family/ staff Communication: Reviewed plan of care with patient  Labs/tests ordered:  - CBC with Differential/Platelet - CMP with eGFR([facility]) - TSH - Hgb A1C - Lipid panel  Next Appointment : 6 months for medical management of chronic issues.Fasting Labs prior to visit.    Sandrea Hughs, NP

## 2020-10-07 DIAGNOSIS — Z23 Encounter for immunization: Secondary | ICD-10-CM | POA: Diagnosis not present

## 2020-10-12 ENCOUNTER — Other Ambulatory Visit: Payer: Self-pay | Admitting: Gastroenterology

## 2020-10-16 ENCOUNTER — Ambulatory Visit: Payer: Medicare Other | Admitting: Podiatry

## 2020-11-14 ENCOUNTER — Emergency Department (HOSPITAL_BASED_OUTPATIENT_CLINIC_OR_DEPARTMENT_OTHER): Payer: Medicare Other

## 2020-11-14 ENCOUNTER — Other Ambulatory Visit: Payer: Self-pay

## 2020-11-14 ENCOUNTER — Emergency Department (HOSPITAL_BASED_OUTPATIENT_CLINIC_OR_DEPARTMENT_OTHER)
Admission: EM | Admit: 2020-11-14 | Discharge: 2020-11-14 | Disposition: A | Discharge status: Home / Self Care | Payer: Medicare Other | Attending: Emergency Medicine | Admitting: Emergency Medicine

## 2020-11-14 ENCOUNTER — Encounter (HOSPITAL_BASED_OUTPATIENT_CLINIC_OR_DEPARTMENT_OTHER): Payer: Self-pay | Admitting: *Deleted

## 2020-11-14 ENCOUNTER — Ambulatory Visit
Admission: EM | Admit: 2020-11-14 | Discharge: 2020-11-14 | Disposition: A | Discharge status: Home / Self Care | Payer: Medicare Other

## 2020-11-14 ENCOUNTER — Encounter: Payer: Self-pay | Admitting: Emergency Medicine

## 2020-11-14 DIAGNOSIS — K219 Gastro-esophageal reflux disease without esophagitis: Secondary | ICD-10-CM | POA: Insufficient documentation

## 2020-11-14 DIAGNOSIS — I10 Essential (primary) hypertension: Secondary | ICD-10-CM | POA: Diagnosis not present

## 2020-11-14 DIAGNOSIS — Z87891 Personal history of nicotine dependence: Secondary | ICD-10-CM | POA: Diagnosis not present

## 2020-11-14 DIAGNOSIS — Z79899 Other long term (current) drug therapy: Secondary | ICD-10-CM | POA: Insufficient documentation

## 2020-11-14 DIAGNOSIS — Z7982 Long term (current) use of aspirin: Secondary | ICD-10-CM | POA: Diagnosis not present

## 2020-11-14 DIAGNOSIS — Z96653 Presence of artificial knee joint, bilateral: Secondary | ICD-10-CM | POA: Diagnosis not present

## 2020-11-14 DIAGNOSIS — R1032 Left lower quadrant pain: Secondary | ICD-10-CM

## 2020-11-14 DIAGNOSIS — K8020 Calculus of gallbladder without cholecystitis without obstruction: Secondary | ICD-10-CM | POA: Diagnosis not present

## 2020-11-14 DIAGNOSIS — E1149 Type 2 diabetes mellitus with other diabetic neurological complication: Secondary | ICD-10-CM | POA: Insufficient documentation

## 2020-11-14 DIAGNOSIS — Z7984 Long term (current) use of oral hypoglycemic drugs: Secondary | ICD-10-CM | POA: Insufficient documentation

## 2020-11-14 DIAGNOSIS — K5790 Diverticulosis of intestine, part unspecified, without perforation or abscess without bleeding: Secondary | ICD-10-CM | POA: Diagnosis not present

## 2020-11-14 DIAGNOSIS — C169 Malignant neoplasm of stomach, unspecified: Secondary | ICD-10-CM | POA: Diagnosis not present

## 2020-11-14 DIAGNOSIS — R109 Unspecified abdominal pain: Secondary | ICD-10-CM

## 2020-11-14 DIAGNOSIS — N3000 Acute cystitis without hematuria: Secondary | ICD-10-CM | POA: Diagnosis not present

## 2020-11-14 DIAGNOSIS — D490 Neoplasm of unspecified behavior of digestive system: Secondary | ICD-10-CM

## 2020-11-14 LAB — CBC
HCT: 31.3 % — ABNORMAL LOW (ref 36.0–46.0)
Hemoglobin: 10.2 g/dL — ABNORMAL LOW (ref 12.0–15.0)
MCH: 29.3 pg (ref 26.0–34.0)
MCHC: 32.6 g/dL (ref 30.0–36.0)
MCV: 89.9 fL (ref 80.0–100.0)
Platelets: 321 10*3/uL (ref 150–400)
RBC: 3.48 MIL/uL — ABNORMAL LOW (ref 3.87–5.11)
RDW: 12.6 % (ref 11.5–15.5)
WBC: 15.7 10*3/uL — ABNORMAL HIGH (ref 4.0–10.5)
nRBC: 0 % (ref 0.0–0.2)

## 2020-11-14 LAB — URINALYSIS, ROUTINE W REFLEX MICROSCOPIC
Bilirubin Urine: NEGATIVE
Glucose, UA: NEGATIVE mg/dL
Ketones, ur: NEGATIVE mg/dL
Nitrite: NEGATIVE
Specific Gravity, Urine: 1.021 (ref 1.005–1.030)
pH: 5.5 (ref 5.0–8.0)

## 2020-11-14 LAB — COMPREHENSIVE METABOLIC PANEL
ALT: 15 U/L (ref 0–44)
AST: 23 U/L (ref 15–41)
Albumin: 4.2 g/dL (ref 3.5–5.0)
Alkaline Phosphatase: 57 U/L (ref 38–126)
Anion gap: 10 (ref 5–15)
BUN: 19 mg/dL (ref 8–23)
CO2: 22 mmol/L (ref 22–32)
Calcium: 9.8 mg/dL (ref 8.9–10.3)
Chloride: 98 mmol/L (ref 98–111)
Creatinine, Ser: 1.04 mg/dL — ABNORMAL HIGH (ref 0.44–1.00)
GFR, Estimated: 56 mL/min — ABNORMAL LOW (ref >60)
Glucose, Bld: 120 mg/dL — ABNORMAL HIGH (ref 70–99)
Potassium: 4.3 mmol/L (ref 3.5–5.1)
Sodium: 130 mmol/L — ABNORMAL LOW (ref 135–145)
Total Bilirubin: 0.7 mg/dL (ref 0.3–1.2)
Total Protein: 8.1 g/dL (ref 6.5–8.1)

## 2020-11-14 LAB — LIPASE, BLOOD: Lipase: 19 U/L (ref 11–51)

## 2020-11-14 MED ORDER — CEPHALEXIN 500 MG PO CAPS: 500.0000 mg | ORAL_CAPSULE | Freq: Two times a day (BID) | ORAL | 0 refills | Status: DC

## 2020-11-14 MED ORDER — SODIUM CHLORIDE 0.9 % IV BOLUS
500.0000 mL | Freq: Once | INTRAVENOUS | Status: AC
  Administered 2020-11-14: 500 mL via INTRAVENOUS

## 2020-11-14 MED ORDER — CEPHALEXIN 250 MG PO CAPS
500.0000 mg | ORAL_CAPSULE | Freq: Once | ORAL | Status: AC
  Administered 2020-11-14: 500 mg via ORAL
  Filled 2020-11-14: qty 2

## 2020-11-14 MED ORDER — IOHEXOL 350 MG/ML SOLN
100.0000 mL | Freq: Once | INTRAVENOUS | Status: AC | PRN
  Administered 2020-11-14: 60 mL via INTRAVENOUS

## 2020-11-14 MED ORDER — KETOROLAC TROMETHAMINE 30 MG/ML IJ SOLN
15.0000 mg | Freq: Once | INTRAMUSCULAR | Status: AC
  Administered 2020-11-14: 15 mg via INTRAVENOUS
  Filled 2020-11-14: qty 1

## 2020-11-14 MED ORDER — MORPHINE SULFATE (PF) 4 MG/ML IV SOLN
4.0000 mg | Freq: Once | INTRAVENOUS | Status: AC
  Administered 2020-11-14: 4 mg via INTRAVENOUS
  Filled 2020-11-14: qty 1

## 2020-11-14 MED ORDER — ONDANSETRON HCL 4 MG/2ML IJ SOLN
4.0000 mg | Freq: Once | INTRAMUSCULAR | Status: AC
  Administered 2020-11-14: 4 mg via INTRAVENOUS
  Filled 2020-11-14: qty 2

## 2020-11-14 MED ORDER — OXYCODONE-ACETAMINOPHEN 5-325 MG PO TABS: 1.0000 | ORAL_TABLET | Freq: Four times a day (QID) | ORAL | 0 refills | Status: DC | PRN

## 2020-11-14 NOTE — ED Triage Notes (Signed)
Pt is here after she was seen at [facility] for abdominal pain and told to come here for further testing.  Pt did not have any labs at [facility].  Pt has been having left sided abdominal pain and felt she was constipation so she took a tea and two dulcolox with resulting BM but no relief of pain. No fever, pt did have n/v

## 2020-11-14 NOTE — ED Notes (Signed)
Patient is being discharged from the Urgent Care and sent to the Emergency Department via POV . Per Myers,PA, patient is in need of higher level of care due to further evaluation. Patient is aware and verbalizes understanding of plan of care.  Vitals:   11/14/20 1906  BP: 118/76  Pulse: (!) 126  Temp: 98.3 F (36.8 C)  SpO2: 96%

## 2020-11-14 NOTE — Discharge Instructions (Signed)
Please report to   [facility]  [address] [place], [place] [address]

## 2020-11-14 NOTE — Discharge Instructions (Addendum)
You are seen in the emergency department for some left-sided abdominal pain.  Your urine showed a possible infection and we are prescribing some antibiotics.  Your CAT scan showed a mass near your stomach on the left.  This may be causing your pain.  This will need follow-up with your GI doctor.  Please contact him as soon as possible.  We are prescribing you a short course of some pain medicine to help with your symptoms.  If you experience a fever or worsening symptoms please return to the emergency department

## 2020-11-14 NOTE — ED Triage Notes (Signed)
Patient c/o LUQ pain since last night.  Patient isn't sure if it's gas pain or constipation.  Patient did have a BM x 3 today.  Patient did have one episode of vomiting this morning after eating grapes.

## 2020-11-14 NOTE — ED Provider Notes (Signed)
[facility] DE[facility] Provider Note   CSN: [id number] Arrival date & time: 11/14/20  2025     History Chief Complaint  Patient presents with   Abdominal Pain    Alison [name] is a 75 y.o. female.  He has a history of diverticulitis and has either a partial or full colonic resection.  Complaining of left sided abdominal pain since last evening.  Rates it as 8 out of 10 and kept her up all night long.  Her bowels have been loose and normal.  No blood.  No fevers or chills.  No chest pain or shortness of breath.  She did feel nauseous and vomited once.  The history is provided by the patient.  Abdominal Pain Pain location:  LLQ Pain quality: aching   Pain radiates to:  Does not radiate Pain severity:  Severe Onset quality:  Gradual Duration:  2 days Timing:  Constant Progression:  Worsening Chronicity:  New Context: not trauma   Relieved by:  Nothing Worsened by:  Movement and palpation Ineffective treatments:  None tried Associated symptoms: nausea and vomiting   Associated symptoms: no chest pain, no chills, no constipation, no cough, no dysuria, no fever, no hematuria, no shortness of breath and no sore throat       Past Medical History:  Diagnosis Date   Anxiety    Benign essential hypertension    Blood transfusion without reported diagnosis    Cataract    removed bilateraly   Diabetes mellitus without complication ([place])    Diverticulitis of colon (without mention of hemorrhage)(562.11)    GERD (gastroesophageal reflux disease)    Goiter, specified as simple    Helicobacter pylori gastritis    Hyperlipidemia LDL goal < 100    Intrahepatic bile duct dilation    Numbness    TOES RT FOOT and toes left foot   Obesity, unspecified    Osteoarthrosis, unspecified whether generalized or localized, lower leg    Osteopenia    Other specified disease of nail    FUNGUS OF FINGERNAILS   Pernicious anemia    Unspecified glaucoma(365.9)    Unspecified  vitamin D deficiency    Wears dentures    Wears glasses     Patient Active Problem List   Diagnosis Date Noted   Calculus of bile duct without cholangitis with obstruction    Renal calcification 03/10/2019   Transaminitis 02/04/2019   Abnormal ultrasound of liver 02/04/2019   Sessile rectal polyp 05/02/2018   Internal hemorrhoids without complication 74/94/4967   Diverticulosis 05/02/2018   Leukoplakia of oral mucosa, 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   Chronic pain of both knees 08/19/2017   Cataracts, bilateral 07/08/2015   Hyperlipidemia associated with type 2 diabetes mellitus ([place]) 04/27/2015   History of total knee arthroplasty 04/18/2014   History of lumbar laminectomy for spinal cord decompression 05/04/2013   Synovial cyst of lumbar spine 03/31/2013   Spinal stenosis of lumbar region with neurogenic claudication 03/30/2013   Synovial cyst of lumbar facet joint 03/30/2013   Type 2 diabetes mellitus with neurological complications ([place]) 59/16/3846   Osteoarthritis of right knee 02/01/2013   History of total knee replacement 02/01/2013   Hyperglycemia 10/03/2012   Unspecified vitamin D deficiency    Pernicious anemia    Glaucoma (increased eye pressure)    Benign essential hypertension    Osteopenia     Past Surgical History:  Procedure Laterality Date   BACK SURGERY     CATARACT EXTRACTION     right  eye 06/20/15 left eye 09/12/15   COLECTOMY  2009   COLONOSCOPY  09/06/2007   Dr Carol Ada   DILATION AND CURETTAGE OF UTERUS     ENDOSCOPIC RETROGRADE CHOLANGIOPANCREATOGRAPHY (ERCP) WITH PROPOFOL N/A 03/17/2019   Procedure: ENDOSCOPIC RETROGRADE CHOLANGIOPANCREATOGRAPHY (ERCP) WITH PROPOFOL;  Surgeon: Ladene Artist, MD;  Location: [facility] ENDOSCOPY;  Service: Endoscopy;  Laterality: N/A;   JOINT REPLACEMENT     right   LUMBAR LAMINECTOMY/DECOMPRESSION MICRODISCECTOMY N/A 03/30/2013   Procedure: CENTRAL DECOMPRESSION L4 - L5 AND EXCISION 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;  Surgeon:  Tobi Bastos, MD;  Location: [facility];  Service: Orthopedics;  Laterality: N/A;   MULTIPLE TOOTH EXTRACTIONS     REMOVAL OF STONES  03/17/2019   Procedure: REMOVAL OF STONES;  Surgeon: Ladene Artist, MD;  Location: [facility];  Service: Endoscopy;;   SIGMOIDOSCOPY  02/16/2018   Dr Nira Conn  03/17/2019   Procedure: SPHINCTEROTOMY;  Surgeon: Ladene Artist, MD;  Location: [facility];  Service: Endoscopy;;   TOTAL KNEE ARTHROPLASTY Right 02/01/2013   Procedure: RIGHT TOTAL KNEE ARTHROPLASTY;  Surgeon: Tobi Bastos, MD;  Location: [facility];  Service: Orthopedics;  Laterality: Right;   TOTAL KNEE ARTHROPLASTY Left 04/18/2014   Procedure: LEFT TOTAL KNEE ARTHROPLASTY;  Surgeon: Latanya Maudlin, MD;  Location: [facility];  Service: Orthopedics;  Laterality: Left;     OB History   No obstetric history on file.     Family History  Problem Relation Age of Onset   Alzheimer's disease Mother    Stroke Mother    Hyperlipidemia Mother    Heart disease Father    Diabetes Sister    Colon polyps Neg Hx    Colon cancer Neg Hx    Esophageal cancer Neg Hx    Rectal cancer Neg Hx    Stomach cancer Neg Hx     Social History   Tobacco Use   Smoking status: Former    Packs/day: 0.50    Years: 30.00    Pack years: 15.00    Types: Cigarettes    Quit date: 03/2019    Years since quitting: 1.6   Smokeless tobacco: Never  Vaping Use   Vaping Use: Never used  Substance Use Topics   Alcohol use: No   Drug use: No    Home Medications Prior to Admission medications   Medication Sig Start Date End Date Taking? Authorizing Provider  acetaminophen (TYLENOL) 650 MG CR tablet Take 1,300 mg by mouth in the morning and at bedtime.    [provider]  amLODipine (NORVASC) 5 MG tablet Take 1 tablet (5 mg total) by mouth daily. 03/25/20   Ngetich, Dinah C, NP  aspirin EC 81 MG tablet Take 81 mg by mouth daily.    [provider]  atorvastatin (LIPITOR) 40 MG tablet TAKE  1 TABLET BY MOUTH ONCE DAILY AT  [facility] 06/07/20   [place], Amy E, NP  Calcium Carb-Cholecalciferol (CALCIUM 600 + D PO) Take 1 tablet by mouth daily.    [provider]  carboxymethylcellulose (REFRESH PLUS) 0.5 % SOLN Place 1 drop into both eyes 3 (three) times daily as needed (dry eyes).     [provider]  celecoxib (CELEBREX) 200 MG capsule Take 200 mg by mouth daily.    [provider]  Cholecalciferol (VITAMIN D-3) 1000 UNITS CAPS Take 1,000 Units by mouth daily.     [provider]  dorzolamide-timolol (COSOPT) 22.3-6.8 MG/ML ophthalmic solution  Place 1 drop into both eyes 2 (two) times daily. 09/01/20   [provider]  gabapentin (NEURONTIN) 300 MG capsule Take 1 capsule (300 mg total) by mouth at bedtime. 07/26/20   Ngetich, Nelda Bucks, NP  Homeopathic Products [facility] RELIEF EX) Apply 1 application topically daily as needed (pain).    [provider]  latanoprost (XALATAN) 0.005 % ophthalmic solution Place 1 drop into both eyes at bedtime.    [provider]  lisinopril (ZESTRIL) 30 MG tablet Take 1 tablet (30 mg total) by mouth daily. 07/08/20   Ngetich, Dinah C, NP  metFORMIN (GLUCOPHAGE) 500 MG tablet TAKE 1/2 (ONE-HALF) TABLET BY MOUTH IN THE EVENING WITH SUPPER 06/13/20   Ngetich, Dinah C, NP  Multiple Vitamin (MULTIVITAMIN) tablet Take 1 tablet by mouth daily.    [provider]  Omega-3 Fatty Acids (OMEGA 3 PO) Take 1,200 mg by mouth daily.    [provider]  pantoprazole (PROTONIX) 40 MG tablet Take 1 tablet by mouth once daily 10/14/20   Ladene Artist, MD  trolamine salicylate (ASPERCREME) 10 % cream Apply 1 application topically as needed for muscle pain.    [provider]    Allergies    Aspirin  Review of Systems   Review of Systems  Constitutional:  Negative for chills and fever.  HENT:  Negative for sore throat.   Eyes:  Negative for pain.  Respiratory:  Negative for cough and  shortness of breath.   Cardiovascular:  Negative for chest pain.  Gastrointestinal:  Positive for abdominal pain, nausea and vomiting. Negative for constipation.  Genitourinary:  Negative for dysuria and hematuria.  Musculoskeletal:  Negative for neck pain.  Skin:  Negative for rash.  Neurological:  Negative for headaches.   Physical Exam Updated Vital Signs BP 118/71 (BP Location: Right Arm)   Pulse 85   Temp 97.7 F (36.5 C) (Oral)   Resp 16   SpO2 100%   Physical Exam Vitals and nursing note reviewed.  Constitutional:      General: She is not in acute distress.    Appearance: Normal appearance. She is well-developed.  HENT:     Head: Normocephalic and atraumatic.  Eyes:     Conjunctiva/sclera: Conjunctivae normal.  Cardiovascular:     Rate and Rhythm: Normal rate and regular rhythm.     Heart sounds: No murmur heard. Pulmonary:     Effort: Pulmonary effort is normal. No respiratory distress.     Breath sounds: Normal breath sounds.  Abdominal:     Palpations: Abdomen is soft.     Tenderness: There is abdominal tenderness in the left lower quadrant. There is no guarding or rebound.  Musculoskeletal:        General: No deformity or signs of injury.     Cervical back: Neck supple.  Skin:    General: Skin is warm and dry.  Neurological:     General: No focal deficit present.     Mental Status: She is alert.    ED Results / Procedures / Treatments   Labs (all labs ordered are listed, but only abnormal results are displayed) Labs Reviewed  COMPREHENSIVE METABOLIC PANEL - Abnormal; Notable for the following components:      Result Value   Sodium 130 (*)    Glucose, Bld 120 (*)    Creatinine, Ser 1.04 (*)    GFR, Estimated 56 (*)    All other components within normal limits  CBC - Abnormal; Notable for  the following components:   WBC 15.7 (*)    RBC 3.48 (*)    Hemoglobin 10.2 (*)    HCT 31.3 (*)    All other components within normal limits  URINALYSIS,  ROUTINE W REFLEX MICROSCOPIC - Abnormal; Notable for the following components:   Hgb urine dipstick SMALL (*)    Protein, ur TRACE (*)    Leukocytes,Ua SMALL (*)    Bacteria, UA MANY (*)    All other components within normal limits  URINE CULTURE  LIPASE, BLOOD    EKG None  Radiology CT Abdomen Pelvis W Contrast  Result Date: 11/14/2020 CLINICAL DATA:  Diverticulitis suspected.  Left upper quadrant pain. EXAM: CT ABDOMEN AND PELVIS WITH CONTRAST TECHNIQUE: Multidetector CT imaging of the abdomen and pelvis was performed using the standard protocol following bolus administration of intravenous contrast. CONTRAST:  69mL OMNIPAQUE IOHEXOL 350 MG/ML SOLN COMPARISON:  05/12/2019 FINDINGS: Lower chest: Lung bases are clear. No effusions. Heart is normal size. Hepatobiliary: Gallstone within the gallbladder fundus. No focal hepatic abnormality. Pancreas: No focal abnormality or ductal dilatation. Spleen: No focal abnormality.  Normal size. Adrenals/Urinary Tract: 12 mm rim calcified cyst in the midpole of the right kidney. No hydronephrosis. Small cyst in the midpole of the left kidney. Adrenal glands and urinary bladder unremarkable. Stomach/Bowel: There is a large mass in the left upper quadrant noted along the greater curvature of the stomach. This measures 7.7 x 6.4 cm. This is most compatible with GIST. Large and small bowel grossly unremarkable. No bowel obstruction. Vascular/Lymphatic: Aortic atherosclerosis. No evidence of aneurysm or adenopathy. Reproductive: Numerous calcified and noncalcified fibroids throughout the uterus. No adnexal mass. Other: No free fluid or free air. Supraumbilical midline ventral hernia containing peritoneal fat. No bowel. Musculoskeletal: No acute bony abnormality. IMPRESSION: 7.7 cm solid enhancing mass along the greater curvature of the stomach in the left upper quadrant. 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 Recommend GI consultation.  Cholelithiasis. Uterine fibroids. Aortic atherosclerosis. Midline supraumbilical ventral hernia containing fat. Electronically Signed   By: Rolm Baptise M.D.   On: 11/14/2020 22:58    Procedures Procedures   Medications Ordered in ED Medications  morphine 4 MG/ML injection 4 mg (has no administration in time range)  sodium chloride 0.9 % bolus 500 mL (has no administration in time range)  ondansetron (ZOFRAN) injection 4 mg (has no administration in time range)    ED Course  I have reviewed the triage vital signs and the nursing notes.  Pertinent labs & imaging results that were available during my care of the patient were reviewed by me and considered in my medical decision making (see chart for details).  Clinical Course as of 11/15/20 1039  Thu Nov 14, 2020  2253 Subtotal colectomy done in 2009. [MB]  2311 Patient is feeling better after pain medicine and fluids.  I reviewed the findings of the gastric tumor and the urinary tract possible infection with her.  She has seen Dr. Fuller Plan in the past and would follow-up with him.  She is comfortable plan for outpatient pain medication antibiotics and GI follow-up. [MB]    Clinical Course User Index [MB] Hayden Rasmussen, MD   MDM Rules/Calculators/A&P                          This patient complains of left-sided abdominal pain nausea vomiting ; this involves an extensive number of treatment Options and is a  complaint that carries with it a high risk of complications and Morbidity. The differential includes obstruction, diverticulitis, colitis, nephritis lithiasis, cholecystitis peptic ulcer disease vascular  I ordered, reviewed and interpreted labs, which included CBC with elevated white count hemoglobin slightly lower than priors, chemistries unremarkable other than low sodium elevated glucose, urinalysis equivocal for infection with no symptoms I ordered medication IV fluids nausea and pain medication with improvement in her symptoms,  oral antibiotics I ordered imaging studies which included CT abdomen and pelvis and I independently    visualized and interpreted imaging which showed solid mass adjacent to stomach concerning for GIST tumor cholelithiasis, uterine fibroids, ventral hernia without obstruction Additional history obtained from patient's daughter Previous records obtained and reviewed in epic, no recent admissions  After the interventions stated above, I reevaluated the patient and found patient's pain to be improved and tolerating p.o.  Reviewed results of work-up with patient and daughter.  I have also secure messaged patient's GI doctor Dr. Fuller Plan.  Recommended close follow-up with him regarding this mass.  Will cover with antibiotics for possible urinary tract infection and also some pain medication for symptomatic relief.  Return instructions discussed   Final Clinical Impression(s) / ED Diagnoses Final diagnoses:  Abdominal pain, unspecified abdominal location  Gastric tumor  Acute cystitis without hematuria    Rx / DC Orders ED Discharge Orders          Ordered    oxyCODONE-acetaminophen (PERCOCET/ROXICET) 5-325 MG tablet  Every 6 hours PRN        11/14/20 2320    cephALEXin (KEFLEX) 500 MG capsule  2 times daily        11/14/20 2320             Hayden Rasmussen, MD 11/15/20 1044

## 2020-11-15 ENCOUNTER — Encounter: Payer: Self-pay | Admitting: Physician Assistant

## 2020-11-15 NOTE — ED Provider Notes (Signed)
[facility]    CSN: [id number] Arrival date & time: 11/14/20  1729      History   Chief Complaint Chief Complaint  Patient presents with   Abdominal Pain    HPI Alison [name] is a 75 y.o. female.   Patient here today for evaluation of left upper and left lower quadrant pain that started today.  She reports she has had 3 bowel movements today.  There is been no blood in her stool.  She reports 1 episode of vomiting after eating grapes today.  She does not report any treatment for symptoms.  She has not had fever or chills that she is aware of.  She does have history of bowel surgery.  The history is provided by the patient.  Abdominal Pain Associated symptoms: vomiting   Associated symptoms: no chills, no constipation, no diarrhea, no fever, no nausea and no shortness of breath    Past Medical History:  Diagnosis Date   Anxiety    Benign essential hypertension    Blood transfusion without reported diagnosis    Cataract    removed bilateraly   Diabetes mellitus without complication ([place])    Diverticulitis of colon (without mention of hemorrhage)(562.11)    GERD (gastroesophageal reflux disease)    Goiter, specified as simple    Helicobacter pylori gastritis    Hyperlipidemia LDL goal < 100    Intrahepatic bile duct dilation    Numbness    TOES RT FOOT and toes left foot   Obesity, unspecified    Osteoarthrosis, unspecified whether generalized or localized, lower leg    Osteopenia    Other specified disease of nail    FUNGUS OF FINGERNAILS   Pernicious anemia    Unspecified glaucoma(365.9)    Unspecified vitamin D deficiency    Wears dentures    Wears glasses     Patient Active Problem List   Diagnosis Date Noted   Calculus of bile duct without cholangitis with obstruction    Renal calcification 03/10/2019   Transaminitis 02/04/2019   Abnormal ultrasound of liver 02/04/2019   Sessile rectal polyp 05/02/2018   Internal hemorrhoids without  complication 83/41/9622   Diverticulosis 05/02/2018   Leukoplakia of oral mucosa, 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   Chronic pain of both knees 08/19/2017   Cataracts, bilateral 07/08/2015   Hyperlipidemia associated with type 2 diabetes mellitus ([place]) 04/27/2015   History of total knee arthroplasty 04/18/2014   History of lumbar laminectomy for spinal cord decompression 05/04/2013   Synovial cyst of lumbar spine 03/31/2013   Spinal stenosis of lumbar region with neurogenic claudication 03/30/2013   Synovial cyst of lumbar facet joint 03/30/2013   Type 2 diabetes mellitus with neurological complications ([place]) 29/79/8921   Osteoarthritis of right knee 02/01/2013   History of total knee replacement 02/01/2013   Hyperglycemia 10/03/2012   Unspecified vitamin D deficiency    Pernicious anemia    Glaucoma (increased eye pressure)    Benign essential hypertension    Osteopenia     Past Surgical History:  Procedure Laterality Date   BACK SURGERY     CATARACT EXTRACTION     right eye 06/20/15 left eye 09/12/15   COLECTOMY  2009   COLONOSCOPY  09/06/2007   Dr Carol Ada   DILATION AND CURETTAGE OF UTERUS     ENDOSCOPIC RETROGRADE CHOLANGIOPANCREATOGRAPHY (ERCP) WITH PROPOFOL N/A 03/17/2019   Procedure: ENDOSCOPIC RETROGRADE CHOLANGIOPANCREATOGRAPHY (ERCP) WITH PROPOFOL;  Surgeon: Ladene Artist, MD;  Location: [facility] ENDOSCOPY;  Service: Endoscopy;  Laterality: N/A;   JOINT REPLACEMENT     right   LUMBAR LAMINECTOMY/DECOMPRESSION MICRODISCECTOMY N/A 03/30/2013   Procedure: CENTRAL DECOMPRESSION L4 - L5 AND EXCISION 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;  Surgeon: Tobi Bastos, MD;  Location: [facility];  Service: Orthopedics;  Laterality: N/A;   MULTIPLE TOOTH EXTRACTIONS     REMOVAL OF STONES  03/17/2019   Procedure: REMOVAL OF STONES;  Surgeon: Ladene Artist, MD;  Location: [facility];  Service: Endoscopy;;   SIGMOIDOSCOPY  02/16/2018   Dr Nira Conn  03/17/2019   Procedure:  SPHINCTEROTOMY;  Surgeon: Ladene Artist, MD;  Location: [facility];  Service: Endoscopy;;   TOTAL KNEE ARTHROPLASTY Right 02/01/2013   Procedure: RIGHT TOTAL KNEE ARTHROPLASTY;  Surgeon: Tobi Bastos, MD;  Location: [facility];  Service: Orthopedics;  Laterality: Right;   TOTAL KNEE ARTHROPLASTY Left 04/18/2014   Procedure: LEFT TOTAL KNEE ARTHROPLASTY;  Surgeon: Latanya Maudlin, MD;  Location: [facility];  Service: Orthopedics;  Laterality: Left;    OB History   No obstetric history on file.      Home Medications    Prior to Admission medications   Medication Sig Start Date End Date Taking? Authorizing Provider  acetaminophen (TYLENOL) 650 MG CR tablet Take 1,300 mg by mouth in the morning and at bedtime.   Yes [provider]  amLODipine (NORVASC) 5 MG tablet Take 1 tablet (5 mg total) by mouth daily. 03/25/20  Yes Ngetich, Dinah C, NP  aspirin EC 81 MG tablet Take 81 mg by mouth daily.   Yes [provider]  atorvastatin (LIPITOR) 40 MG tablet TAKE 1 TABLET BY MOUTH ONCE DAILY AT  6PM 06/07/20  Yes [place], Amy E, NP  Calcium Carb-Cholecalciferol (CALCIUM 600 + D PO) Take 1 tablet by mouth daily.   Yes [provider]  carboxymethylcellulose (REFRESH PLUS) 0.5 % SOLN Place 1 drop into both eyes 3 (three) times daily as needed (dry eyes).    Yes [provider]  celecoxib (CELEBREX) 200 MG capsule Take 200 mg by mouth daily.   Yes [provider]  Cholecalciferol (VITAMIN D-3) 1000 UNITS CAPS Take 1,000 Units by mouth daily.    Yes [provider]  dorzolamide-timolol (COSOPT) 22.3-6.8 MG/ML ophthalmic solution Place 1 drop into both eyes 2 (two) times daily. 09/01/20  Yes [provider]  gabapentin (NEURONTIN) 300 MG capsule Take 1 capsule (300 mg total) by mouth at bedtime. 07/26/20  Yes Ngetich, Dinah C, NP  Homeopathic Products (THERAWORX RELIEF EX) Apply 1 application topically daily as needed (pain).   Yes [provider]  latanoprost (XALATAN) 0.005 % ophthalmic solution Place 1 drop into both eyes at bedtime.   Yes [provider]  lisinopril (ZESTRIL) 30 MG tablet Take 1 tablet (30 mg total) by mouth daily. 07/08/20  Yes Ngetich, Dinah C, NP  metFORMIN (GLUCOPHAGE) 500 MG tablet TAKE 1/2 (ONE-HALF) TABLET BY MOUTH IN THE EVENING WITH SUPPER 06/13/20  Yes Ngetich, Dinah C, NP  Multiple Vitamin (MULTIVITAMIN) tablet Take 1 tablet by mouth daily.   Yes [provider]  Omega-3 Fatty Acids (OMEGA 3 PO) Take 1,200 mg by mouth daily.   Yes [provider]  pantoprazole (PROTONIX) 40 MG tablet Take 1 tablet by mouth once daily 10/14/20  Yes Ladene Artist, MD  trolamine salicylate (ASPERCREME) 10 % cream Apply 1 application topically as needed for muscle pain.   Yes [provider]  cephALEXin (KEFLEX) 500  MG capsule Take 1 capsule (500 mg total) by mouth 2 (two) times daily. 11/14/20   Hayden Rasmussen, MD  oxyCODONE-acetaminophen (PERCOCET/ROXICET) 5-325 MG tablet Take 1 tablet by mouth every 6 (six) hours as needed for severe pain. 11/14/20   Hayden Rasmussen, MD    Family History Family History  Problem Relation Age of Onset   Alzheimer's disease Mother    Stroke Mother    Hyperlipidemia Mother    Heart disease Father    Diabetes Sister    Colon polyps Neg Hx    Colon cancer Neg Hx    Esophageal cancer Neg Hx    Rectal cancer Neg Hx    Stomach cancer Neg Hx     Social History Social History   Tobacco Use   Smoking status: Former    Packs/day: 0.50    Years: 30.00    Pack years: 15.00    Types: Cigarettes    Quit date: 03/2019    Years since quitting: 1.6   Smokeless tobacco: Never  Vaping Use   Vaping Use: Never used  Substance Use Topics   Alcohol use: No   Drug use: No     Allergies   Aspirin   Review of Systems Review of Systems  Constitutional:  Negative for chills and fever.  Eyes:  Negative for discharge and redness.  Respiratory:   Negative for shortness of breath.   Gastrointestinal:  Positive for abdominal pain and vomiting. Negative for blood in stool, constipation, diarrhea and nausea.    Physical Exam Triage Vital Signs ED Triage Vitals  Enc Vitals Group     BP 11/14/20 1906 118/76     Pulse Rate 11/14/20 1906 (!) 126     Resp --      Temp 11/14/20 1906 98.3 F (36.8 C)     Temp Source 11/14/20 1906 Oral     SpO2 11/14/20 1906 96 %     Weight 11/14/20 1907 186 lb (84.4 kg)     Height 11/14/20 1907 5\' 6"  (1.676 m)     Head Circumference --      Peak Flow --      Pain Score 11/14/20 1907 8     Pain Loc --      Pain Edu? --      Excl. in [place]? --    No data found.  Updated Vital Signs BP 118/76 (BP Location: Left Arm)   Pulse (!) 126   Temp 98.3 F (36.8 C) (Oral)   Ht 5\' 6"  (1.676 m)   Wt 186 lb (84.4 kg)   SpO2 96%   BMI 30.02 kg/m      Physical Exam Vitals and nursing note reviewed.  Constitutional:      General: She is not in acute distress.    Appearance: Normal appearance. She is well-developed. She is not ill-appearing.  HENT:     Head: Normocephalic and atraumatic.     Nose: Nose normal.  Cardiovascular:     Rate and Rhythm: Normal rate and regular rhythm.     Heart sounds: Normal heart sounds. No murmur heard. Pulmonary:     Effort: Pulmonary effort is normal. No respiratory distress.     Breath sounds: Normal breath sounds. No wheezing, rhonchi or rales.  Abdominal:     General: Abdomen is flat. Bowel sounds are normal. There is no distension.     Palpations: Abdomen is soft.     Tenderness: There is abdominal tenderness (TTP to LLQ,  pain also noted with movement (trying to sit up)). There is guarding.  Skin:    General: Skin is warm and dry.  Neurological:     Mental Status: She is alert.  Psychiatric:        Mood and Affect: Mood normal.        Thought Content: Thought content normal.     UC Treatments / Results  Labs (all labs ordered are listed, but only abnormal  results are displayed) Labs Reviewed - No data to display  EKG   Radiology CT Abdomen Pelvis W Contrast  Result Date: 11/14/2020 CLINICAL DATA:  Diverticulitis suspected.  Left upper quadrant pain. EXAM: CT ABDOMEN AND PELVIS WITH CONTRAST TECHNIQUE: Multidetector CT imaging of the abdomen and pelvis was performed using the standard protocol following bolus administration of intravenous contrast. CONTRAST:  50mL OMNIPAQUE IOHEXOL 350 MG/ML SOLN COMPARISON:  05/12/2019 FINDINGS: Lower chest: Lung bases are clear. No effusions. Heart is normal size. Hepatobiliary: Gallstone within the gallbladder fundus. No focal hepatic abnormality. Pancreas: No focal abnormality or ductal dilatation. Spleen: No focal abnormality.  Normal size. Adrenals/Urinary Tract: 12 mm rim calcified cyst in the midpole of the right kidney. No hydronephrosis. Small cyst in the midpole of the left kidney. Adrenal glands and urinary bladder unremarkable. Stomach/Bowel: There is a large mass in the left upper quadrant noted along the greater curvature of the stomach. This measures 7.7 x 6.4 cm. This is most compatible with GIST. Large and small bowel grossly unremarkable. No bowel obstruction. Vascular/Lymphatic: Aortic atherosclerosis. No evidence of aneurysm or adenopathy. Reproductive: Numerous calcified and noncalcified fibroids throughout the uterus. No adnexal mass. Other: No free fluid or free air. Supraumbilical midline ventral hernia containing peritoneal fat. No bowel. Musculoskeletal: No acute bony abnormality. IMPRESSION: 7.7 cm solid enhancing mass along the greater curvature of the stomach in the left upper quadrant. 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 Recommend GI consultation. Cholelithiasis. Uterine fibroids. Aortic atherosclerosis. Midline supraumbilical ventral hernia containing fat. Electronically Signed   By: Rolm Baptise M.D.   On: 11/14/2020 22:58    Procedures Procedures  (including critical care time)  Medications Ordered in UC Medications - No data to display  Initial Impression / Assessment and Plan / UC Course  I have reviewed the triage vital signs and the nursing notes.  Pertinent labs & imaging results that were available during my care of the patient were reviewed by me and considered in my medical decision making (see chart for details).   Recommended further evaluation in the ED given prior bowel surgery and significant pain. I suspect she will need abdominal imaging. Patient is agreeable with this plan.   Final Clinical Impressions(s) / UC Diagnoses   Final diagnoses:  Abdominal pain, left lower quadrant     Discharge Instructions      Please report to   [facility]  [place], [place] [id number]     ED Prescriptions   None    PDMP not reviewed this encounter.   Francene Finders, PA-C 11/15/20 [phone]

## 2020-11-17 LAB — URINE CULTURE: Culture: 80000 — AB

## 2020-11-18 ENCOUNTER — Other Ambulatory Visit: Payer: Self-pay

## 2020-11-18 ENCOUNTER — Ambulatory Visit (INDEPENDENT_AMBULATORY_CARE_PROVIDER_SITE_OTHER): Payer: Medicare Other | Admitting: Family

## 2020-11-18 ENCOUNTER — Encounter: Payer: Self-pay | Admitting: Family

## 2020-11-18 VITALS — BP 110/70 | HR 77 | Temp 97.0°F | Ht 66.0 in | Wt 187.6 lb

## 2020-11-18 DIAGNOSIS — D509 Iron deficiency anemia, unspecified: Secondary | ICD-10-CM

## 2020-11-18 DIAGNOSIS — D72829 Elevated white blood cell count, unspecified: Secondary | ICD-10-CM

## 2020-11-18 DIAGNOSIS — R112 Nausea with vomiting, unspecified: Secondary | ICD-10-CM

## 2020-11-18 DIAGNOSIS — R319 Hematuria, unspecified: Secondary | ICD-10-CM | POA: Diagnosis not present

## 2020-11-18 DIAGNOSIS — E871 Hypo-osmolality and hyponatremia: Secondary | ICD-10-CM

## 2020-11-18 LAB — POCT URINALYSIS DIPSTICK
Bilirubin, UA: NEGATIVE
Glucose, UA: NEGATIVE
Leukocytes, UA: NEGATIVE
Nitrite, UA: NEGATIVE
Protein, UA: NEGATIVE
Spec Grav, UA: 1.01 (ref 1.010–1.025)
Urobilinogen, UA: 0.2 E.U./dL
pH, UA: 5 (ref 5.0–8.0)

## 2020-11-18 MED ORDER — CIPROFLOXACIN HCL 500 MG PO TABS: 500.0000 mg | ORAL_TABLET | Freq: Two times a day (BID) | ORAL | 0 refills | Status: AC

## 2020-11-18 MED ORDER — SACCHAROMYCES BOULARDII 250 MG PO CAPS: 250.0000 mg | ORAL_CAPSULE | Freq: Two times a day (BID) | ORAL | 0 refills | Status: AC

## 2020-11-18 MED ORDER — ONDANSETRON HCL 4 MG PO TABS: 4.0000 mg | ORAL_TABLET | Freq: Three times a day (TID) | ORAL | 0 refills | Status: DC | PRN

## 2020-11-18 MED ORDER — CIPROFLOXACIN HCL 500 MG PO TABS: 500.0000 mg | ORAL_TABLET | Freq: Two times a day (BID) | ORAL | 0 refills | Status: DC

## 2020-11-18 NOTE — Progress Notes (Signed)
Provider: Marlowe Sax FNP-C  Aitana Burry, Nelda Bucks, NP  Patient Care Team: Jalin Erpelding, Nelda Bucks, NP as PCP - General (Family Medicine) Shirley Muscat Loreen Freud, MD as Referring Physician (Optometry) Latanya Maudlin, MD as Consulting Physician (Orthopedic Surgery) Warden Fillers, MD as Consulting Physician (Ophthalmology) Ladene Artist, MD as Consulting Physician (Gastroenterology) Abbie Sons, MD (Urology)  Extended Emergency Contact Information Primary Emergency Contact: Bowman,Lillie Address: [address]           [place],  [address] [name] of [place] Phone: [phone] Relation: Sister Secondary Emergency Contact: Crisostomo,[name] Address: [address]          [place],  [address] [name] Phone: [phone] Mobile Phone: [phone] Relation: Daughter  Code Status:  DNR Goals of care: Advanced Directive information Advanced Directives 11/14/2020  Does Patient Have a Medical Advance Directive? No  Type of Advance Directive -  Does patient want to make changes to medical advance directive? -  Would patient like information on creating a medical advance directive? -  Pre-existing out of facility DNR order (yellow form or pink MOST form) -     Chief Complaint  Patient presents with   Acute Visit    Patient went to ED on Thursday and was found to have a UTI. Patient states that antibiotics they prescribed her made her vomit. Last time medication was taking was Saturday morning. Patient was given Keflex    HPI:  Pt is a 75 y.o. female seen today for an acute visit for evaluation follow up ED visit for left sided abdominal pain 11/14/2020.also had nausea and vomiting.Her WBC were elevated 15.7 Na 130 ,Hgb 10.2 previous was 11.3.she was treated with IV fluids and nausea and pain medication with improvement. 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   Urine analysis showed yellow clear urine ,trace protein,small leukocytes and many bacteria.Urine culture showed 80,000 colonies of Klebsiella Pneumoniae. Keflex antibiotics was prescribed for UTI. She is here with her daughter today. Has been having nausea and vomiting over the weekend.Has stopped Keflex given in ED.she took two on Friday and one on Saturday then stopped.  Still has to void frequent.she denies any fever or chills. Took pain medication which has helped with the pain abdomen    Past Medical History:  Diagnosis Date   Anxiety    Benign essential hypertension    Blood transfusion without reported diagnosis    Cataract    removed bilateraly   Diabetes mellitus without complication ([place])    Diverticulitis of colon (without mention of hemorrhage)(562.11)    GERD (gastroesophageal reflux disease)    Goiter, specified as simple    Helicobacter pylori gastritis    Hyperlipidemia LDL goal < 100    Intrahepatic bile duct dilation    Numbness    TOES RT FOOT and toes left foot   Obesity, unspecified    Osteoarthrosis, unspecified whether generalized or localized, lower leg    Osteopenia    Other specified disease of nail    FUNGUS OF FINGERNAILS   Pernicious anemia    Unspecified glaucoma(365.9)    Unspecified vitamin D deficiency    Wears dentures    Wears glasses    Past Surgical History:  Procedure Laterality Date   BACK SURGERY     CATARACT EXTRACTION  right eye 06/20/15 left eye 09/12/15   COLECTOMY  2009   COLONOSCOPY  09/06/2007   Dr Carol Ada   DILATION AND CURETTAGE OF UTERUS     ENDOSCOPIC RETROGRADE CHOLANGIOPANCREATOGRAPHY (ERCP) WITH PROPOFOL N/A 03/17/2019   Procedure: ENDOSCOPIC RETROGRADE CHOLANGIOPANCREATOGRAPHY (ERCP) WITH PROPOFOL;  Surgeon: Ladene Artist, MD;  Location: [facility] ENDOSCOPY;  Service: Endoscopy;   Laterality: N/A;   JOINT REPLACEMENT     right   LUMBAR LAMINECTOMY/DECOMPRESSION MICRODISCECTOMY N/A 03/30/2013   Procedure: CENTRAL DECOMPRESSION L4 - L5 AND EXCISION 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;  Surgeon: Tobi Bastos, MD;  Location: [facility];  Service: Orthopedics;  Laterality: N/A;   MULTIPLE TOOTH EXTRACTIONS     REMOVAL OF STONES  03/17/2019   Procedure: REMOVAL OF STONES;  Surgeon: Ladene Artist, MD;  Location: [facility];  Service: Endoscopy;;   SIGMOIDOSCOPY  02/16/2018   Dr Nira Conn  03/17/2019   Procedure: SPHINCTEROTOMY;  Surgeon: Ladene Artist, MD;  Location: [facility];  Service: Endoscopy;;   TOTAL KNEE ARTHROPLASTY Right 02/01/2013   Procedure: RIGHT TOTAL KNEE ARTHROPLASTY;  Surgeon: Tobi Bastos, MD;  Location: [facility];  Service: Orthopedics;  Laterality: Right;   TOTAL KNEE ARTHROPLASTY Left 04/18/2014   Procedure: LEFT TOTAL KNEE ARTHROPLASTY;  Surgeon: Latanya Maudlin, MD;  Location: [facility];  Service: Orthopedics;  Laterality: Left;    Allergies  Allergen Reactions   Aspirin Nausea Only    Can take 81 mg but can't take $RemoveBe'325mg'[id number]$ , hard on stomach.    Outpatient Encounter Medications as of 11/18/2020  Medication Sig   acetaminophen (TYLENOL) 650 MG CR tablet Take 1,300 mg by mouth in the morning and at bedtime.   amLODipine (NORVASC) 5 MG tablet Take 1 tablet (5 mg total) by mouth daily.   aspirin EC 81 MG tablet Take 81 mg by mouth daily.   atorvastatin (LIPITOR) 40 MG tablet TAKE 1 TABLET BY MOUTH ONCE DAILY AT  6PM   Calcium Carb-Cholecalciferol (CALCIUM 600 + D PO) Take 1 tablet by mouth daily.   carboxymethylcellulose (REFRESH PLUS) 0.5 % SOLN Place 1 drop into both eyes 3 (three) times daily as needed (dry eyes).    celecoxib (CELEBREX) 200 MG capsule Take 200 mg by mouth daily.   Cholecalciferol (VITAMIN D-3) 1000 UNITS CAPS Take 1,000 Units by mouth daily.    dorzolamide-timolol (COSOPT) 22.3-6.8 MG/ML ophthalmic solution Place 1 drop into  both eyes 2 (two) times daily.   gabapentin (NEURONTIN) 300 MG capsule Take 1 capsule (300 mg total) by mouth at bedtime.   Homeopathic Products (THERAWORX RELIEF EX) Apply 1 application topically daily as needed (pain).   latanoprost (XALATAN) 0.005 % ophthalmic solution Place 1 drop into both eyes at bedtime.   lisinopril (ZESTRIL) 30 MG tablet Take 1 tablet (30 mg total) by mouth daily.   metFORMIN (GLUCOPHAGE) 500 MG tablet TAKE 1/2 (ONE-HALF) TABLET BY MOUTH IN THE EVENING WITH SUPPER   Multiple Vitamin (MULTIVITAMIN) tablet Take 1 tablet by mouth daily.   Omega-3 Fatty Acids (OMEGA 3 PO) Take 1,200 mg by mouth daily.   oxyCODONE-acetaminophen (PERCOCET/ROXICET) 5-325 MG tablet Take 1 tablet by mouth every 6 (six) hours as needed for severe pain.   pantoprazole (PROTONIX) 40 MG tablet Take 1 tablet by mouth once daily   trolamine salicylate (ASPERCREME) 10 % cream Apply 1 application topically as needed for muscle pain.   [DISCONTINUED] cephALEXin (KEFLEX) 500 MG capsule Take 1 capsule (500 mg total) by  mouth 2 (two) times daily.   No facility-administered encounter medications on file as of 11/18/2020.    Review of Systems  Constitutional:  Negative for appetite change, chills, fatigue, fever and unexpected weight change.  HENT:  Negative for congestion, dental problem, ear discharge, ear pain, facial swelling, hearing loss, nosebleeds, postnasal drip, rhinorrhea, sinus pressure, sinus pain, sneezing, sore throat, tinnitus and trouble swallowing.   Eyes:  Positive for visual disturbance. Negative for pain, discharge, redness and itching.       Wears glasses   Respiratory:  Negative for cough, chest tightness, shortness of breath and wheezing.   Cardiovascular:  Negative for chest pain, palpitations and leg swelling.  Gastrointestinal:  Negative for abdominal distention, abdominal pain, constipation, diarrhea, nausea and vomiting.  Endocrine: Negative for cold intolerance, heat  intolerance, polydipsia, polyphagia and polyuria.  Genitourinary:  Negative for difficulty urinating, dysuria, flank pain, frequency and urgency.  Musculoskeletal:  Positive for arthralgias and gait problem. Negative for back pain, joint swelling, myalgias, neck pain and neck stiffness.  Skin:  Negative for color change, pallor, rash and wound.  Neurological:  Positive for numbness. Negative for dizziness, syncope, speech difficulty, weakness, light-headedness and headaches.  Hematological:  Does not bruise/bleed easily.  Psychiatric/Behavioral:  Negative for agitation, behavioral problems, confusion and sleep disturbance. The patient is not nervous/anxious.    Immunization History  Administered Date(s) Administered   Fluad Quad(high Dose 65+) 09/05/2018, 09/26/2020   Influenza, High Dose Seasonal PF 10/02/2017, 11/20/2019   Influenza,inj,Quad PF,6+ Mos 10/03/2012, 09/21/2014, 11/07/2015   Influenza,inj,quad, With Preservative 11/12/2016   Influenza-Unspecified 10/28/2013   PFIZER(Purple Top)SARS-COV-2 Vaccination 03/25/2019, 03/25/2019, 04/18/2019, 11/01/2019, 05/23/2020, 10/07/2020   Pneumococcal Conjugate-13 01/04/2014, 01/04/2014, 11/04/2016   Pneumococcal Polysaccharide-23 12/29/2012, 11/19/2017   Zoster Recombinat (Shingrix) 11/25/2016, 01/26/2017   Pertinent  Health Maintenance Due  Topic Date Due   FOOT EXAM  01/10/2021   HEMOGLOBIN A1C  03/23/2021   OPHTHALMOLOGY EXAM  09/23/2021   COLONOSCOPY (Pts 45-68yrs Insurance coverage will need to be confirmed)  02/17/2023   INFLUENZA VACCINE  Completed   DEXA SCAN  Completed   Fall Risk 06/28/2020 07/29/2020 09/26/2020 11/14/2020 11/14/2020  Falls in the past year? 0 0 0 - -  Was there an injury with Fall? 0 0 0 - -  Fall Risk Category Calculator 0 0 0 - -  Fall Risk Category Low Low Low - -  Patient Fall Risk Level Low fall risk Low fall risk Low fall risk Low fall risk Moderate fall risk  Patient at Risk for Falls Due to - No Fall  Risks No Fall Risks - -  Fall risk Follow up - Falls evaluation completed Falls evaluation completed - -   Functional Status Survey:    Vitals:   11/18/20 1148  BP: 110/70  Pulse: 77  Temp: (!) 97 F (36.1 C)  SpO2: 95%  Weight: 187 lb 9.6 oz (85.1 kg)  Height: $Remove'5\' 6"'WHtyzPR$  (1.676 m)   Body mass index is 30.28 kg/m. Physical Exam Vitals reviewed.  Constitutional:      General: She is not in acute distress.    Appearance: Normal appearance. She is normal weight. She is not ill-appearing or diaphoretic.  HENT:     Head: Normocephalic.     Right Ear: Tympanic membrane, ear canal and external ear normal. There is no impacted cerumen.     Left Ear: Tympanic membrane, ear canal and external ear normal. There is no impacted cerumen.     Nose: Nose normal. No  congestion or rhinorrhea.     Mouth/Throat:     Mouth: Mucous membranes are moist.     Pharynx: Oropharynx is clear. No oropharyngeal exudate or posterior oropharyngeal erythema.  Eyes:     General: No scleral icterus.       Right eye: No discharge.        Left eye: No discharge.     Extraocular Movements: Extraocular movements intact.     Conjunctiva/sclera: Conjunctivae normal.     Pupils: Pupils are equal, round, and reactive to light.     Comments: Corrective lens in place   Neck:     Vascular: No carotid bruit.  Cardiovascular:     Rate and Rhythm: Normal rate and regular rhythm.     Pulses: Normal pulses.     Heart sounds: Normal heart sounds. No murmur heard.   No friction rub. No gallop.  Pulmonary:     Effort: Pulmonary effort is normal. No respiratory distress.     Breath sounds: Normal breath sounds. No wheezing, rhonchi or rales.  Chest:     Chest wall: No tenderness.  Abdominal:     General: Bowel sounds are normal. There is no distension.     Palpations: Abdomen is soft. There is no mass.     Tenderness: There is no abdominal tenderness. There is no right CVA tenderness, left CVA tenderness, guarding or  rebound.  Musculoskeletal:        General: No swelling or tenderness. Normal range of motion.     Cervical back: Normal range of motion. No rigidity or tenderness.     Right lower leg: No edema.     Left lower leg: No edema.     Comments: Unsteady gait   Lymphadenopathy:     Cervical: No cervical adenopathy.  Skin:    General: Skin is warm and dry.     Coloration: Skin is not pale.     Findings: No bruising, erythema, lesion or rash.  Neurological:     Mental Status: She is alert and oriented to person, place, and time.     Cranial Nerves: No cranial nerve deficit.     Sensory: No sensory deficit.     Motor: No weakness.     Coordination: Coordination normal.     Gait: Gait abnormal.  Psychiatric:        Mood and Affect: Mood normal.        Speech: Speech normal.        Behavior: Behavior normal.        Thought Content: Thought content normal.        Judgment: Judgment normal.    Labs reviewed: Recent Labs    05/13/20 1111 09/23/20 0850 11/14/20 2054  NA 136 138 130*  K 4.4 4.5 4.3  CL 101 103 98  CO2 $Re'27 27 22  'mDb$ GLUCOSE 98 95 120*  BUN $Re'12 14 19  'fxp$ CREATININE 0.83 1.05* 1.04*  CALCIUM 9.9 10.2 9.8   Recent Labs    05/13/20 1111 09/23/20 0850 11/14/20 2054  AST $Re'19 16 23  'QPc$ ALT $R'15 13 15  'fL$ ALKPHOS  --   --  57  BILITOT 0.5 0.6 0.7  PROT 7.9 7.5 8.1  ALBUMIN  --   --  4.2   Recent Labs    03/25/20 1101 05/13/20 1111 09/23/20 0850 11/14/20 2054  WBC 9.1 8.0 9.1 15.7*  NEUTROABS 6,161 4,984 5,997  --   HGB 12.9 12.2 11.3* 10.2*  HCT 39.7 37.7 35.3 31.3*  MCV 89.4 91.1 93.6 89.9  PLT 324 322 360 321   Lab Results  Component Value Date   TSH 0.48 09/04/2019   Lab Results  Component Value Date   HGBA1C 5.8 (H) 09/23/2020   Lab Results  Component Value Date   CHOL 122 09/23/2020   HDL 38 (L) 09/23/2020   LDLCALC 64 09/23/2020   TRIG 113 09/23/2020   CHOLHDL 3.2 09/23/2020    Significant Diagnostic Results in last 30 days:  CT Abdomen Pelvis W  Contrast  Result Date: 11/14/2020 CLINICAL DATA:  Diverticulitis suspected.  Left upper quadrant pain. EXAM: CT ABDOMEN AND PELVIS WITH CONTRAST TECHNIQUE: Multidetector CT imaging of the abdomen and pelvis was performed using the standard protocol following bolus administration of intravenous contrast. CONTRAST:  36mL OMNIPAQUE IOHEXOL 350 MG/ML SOLN COMPARISON:  05/12/2019 FINDINGS: Lower chest: Lung bases are clear. No effusions. Heart is normal size. Hepatobiliary: Gallstone within the gallbladder fundus. No focal hepatic abnormality. Pancreas: No focal abnormality or ductal dilatation. Spleen: No focal abnormality.  Normal size. Adrenals/Urinary Tract: 12 mm rim calcified cyst in the midpole of the right kidney. No hydronephrosis. Small cyst in the midpole of the left kidney. Adrenal glands and urinary bladder unremarkable. Stomach/Bowel: There is a large mass in the left upper quadrant noted along the greater curvature of the stomach. This measures 7.7 x 6.4 cm. This is most compatible with GIST. Large and small bowel grossly unremarkable. No bowel obstruction. Vascular/Lymphatic: Aortic atherosclerosis. No evidence of aneurysm or adenopathy. Reproductive: Numerous calcified and noncalcified fibroids throughout the uterus. No adnexal mass. Other: No free fluid or free air. Supraumbilical midline ventral hernia containing peritoneal fat. No bowel. Musculoskeletal: No acute bony abnormality. IMPRESSION: 7.7 cm solid enhancing mass along the greater curvature of the stomach in the left upper quadrant. 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 Recommend GI consultation. Cholelithiasis. Uterine fibroids. Aortic atherosclerosis. Midline supraumbilical ventral hernia containing fat. Electronically Signed   By: Rolm Baptise M.D.   On: 11/14/2020 22:58    Assessment/Plan 1. Leukocytosis, unspecified type WBC were elevated 15.7  , - CBC with Differential/Platelet - POC Urinalysis  Dipstick - Culture, Urine; Future - Culture, Urine  2. Hyponatremia Na 130 Will recheck CMP - CMP with eGFR([facility])  3. Iron deficiency anemia, unspecified iron deficiency anemia type Hgb 10.2 previous was 11.3 - CBC with Differential/Platelet  4. Hematuria, unspecified type Urine culture showed amber cloudy urine with small ketones,trace blood but negative for nitrites and leukocytes  - Culture, Urine; Future - saccharomyces boulardii (FLORASTOR) 250 MG capsule; Take 1 capsule (250 mg total) by mouth 2 (two) times daily for 10 days.  Dispense: 20 capsule; Refill: 0 - ciprofloxacin (CIPRO) 500 MG tablet; Take 1 tablet (500 mg total) by mouth 2 (two) times daily for 7 days.  Dispense: 14 tablet; Refill: 0 - Culture, Urine  5. 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  - advised to take Zofran at least 30 minutes before meals.encouraged to eat small but frequent meals.  - follow up with GI Dr.Stark as scheduled  - ondansetron (ZOFRAN) 4 MG tablet; Take 1 tablet (4 mg total) by mouth every 8 (eight) hours as needed for nausea or vomiting.  Dispense: 20 tablet; Refill: 0  Family/ staff Communication: Reviewed plan of care  with patient verbalized understanding   Labs/tests ordered:  - CBC with Differential/Platelet - POC Urinalysis Dipstick - Culture, Urine; Future - CMP with eGFR([facility])  Next Appointment: As needed if symptoms worsen or fail to improve    Sandrea Hughs, NP

## 2020-11-19 ENCOUNTER — Other Ambulatory Visit: Payer: Self-pay | Admitting: Family

## 2020-11-19 DIAGNOSIS — E1142 Type 2 diabetes mellitus with diabetic polyneuropathy: Secondary | ICD-10-CM

## 2020-11-19 LAB — COMPLETE METABOLIC PANEL WITH GFR
AG Ratio: 1.2 (calc) (ref 1.0–2.5)
ALT: 14 U/L (ref 6–29)
AST: 19 U/L (ref 10–35)
Albumin: 4.1 g/dL (ref 3.6–5.1)
Alkaline phosphatase (APISO): 66 U/L (ref 37–153)
BUN/Creatinine Ratio: 12 (calc) (ref 6–22)
BUN: 14 mg/dL (ref 7–25)
CO2: 26 mmol/L (ref 20–32)
Calcium: 9.6 mg/dL (ref 8.6–10.4)
Chloride: 100 mmol/L (ref 98–110)
Creat: 1.18 mg/dL — ABNORMAL HIGH (ref 0.60–1.00)
Globulin: 3.3 g/dL (calc) (ref 1.9–3.7)
Glucose, Bld: 112 mg/dL (ref 65–139)
Potassium: 4.4 mmol/L (ref 3.5–5.3)
Sodium: 133 mmol/L — ABNORMAL LOW (ref 135–146)
Total Bilirubin: 0.6 mg/dL (ref 0.2–1.2)
Total Protein: 7.4 g/dL (ref 6.1–8.1)
eGFR: 48 mL/min/{1.73_m2} — ABNORMAL LOW (ref >=60)

## 2020-11-19 LAB — CBC WITH DIFFERENTIAL/PLATELET
Absolute Monocytes: 978 cells/uL — ABNORMAL HIGH (ref 200–950)
Basophils Absolute: 42 cells/uL (ref 0–200)
Basophils Relative: 0.4 %
Eosinophils Absolute: 208 cells/uL (ref 15–500)
Eosinophils Relative: 2 %
HCT: 28.5 % — ABNORMAL LOW (ref 35.0–45.0)
Hemoglobin: 9.3 g/dL — ABNORMAL LOW (ref 11.7–15.5)
Lymphs Abs: 1934 cells/uL (ref 850–3900)
MCH: 29.7 pg (ref 27.0–33.0)
MCHC: 32.6 g/dL (ref 32.0–36.0)
MCV: 91.1 fL (ref 80.0–100.0)
MPV: 9.9 fL (ref 7.5–12.5)
Monocytes Relative: 9.4 %
Neutro Abs: 7238 cells/uL (ref 1500–7800)
Neutrophils Relative %: 69.6 %
Platelets: 428 10*3/uL — ABNORMAL HIGH (ref 140–400)
RBC: 3.13 10*6/uL — ABNORMAL LOW (ref 3.80–5.10)
RDW: 11.5 % (ref 11.0–15.0)
Total Lymphocyte: 18.6 %
WBC: 10.4 10*3/uL (ref 3.8–10.8)

## 2020-11-20 LAB — URINE CULTURE
MICRO NUMBER:: 12603554
SPECIMEN QUALITY:: ADEQUATE

## 2020-11-22 ENCOUNTER — Other Ambulatory Visit: Payer: Self-pay | Admitting: Gastroenterology

## 2020-11-25 ENCOUNTER — Encounter: Payer: Self-pay | Admitting: Nurse Practitioner

## 2020-11-25 ENCOUNTER — Ambulatory Visit (INDEPENDENT_AMBULATORY_CARE_PROVIDER_SITE_OTHER): Payer: Medicare Other | Admitting: Nurse Practitioner

## 2020-11-25 VITALS — BP 100/60 | HR 76 | Ht 66.0 in | Wt 189.4 lb

## 2020-11-25 DIAGNOSIS — R935 Abnormal findings on diagnostic imaging of other abdominal regions, including retroperitoneum: Secondary | ICD-10-CM

## 2020-11-25 DIAGNOSIS — K3189 Other diseases of stomach and duodenum: Secondary | ICD-10-CM

## 2020-11-25 NOTE — Progress Notes (Signed)
ASSESSMENT AND PLAN    # 75 yo female with LUQ mass on CT scan. 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 --Needs EGD with biopsies. Scheduled with Dr. Fuller Plan on 12/03/20. The risks and benefits of EGD with possible biopsies were discussed with the patient who agrees to proceed.   # Alison [name] anemia. Hgb 12.13 May 2020 >> 11.3 Sept 2022 >> 10.2 >> 9.14 Nov 2020. No overt GI bleeding. Rule out occult GI bleed secondary to GIST    HISTORY OF PRESENT ILLNESS    Chief Complaint : gastric tumor on CT scan done in ED  Alison [name] is a 75 y.o. female known to Dr. Fuller Plan with a past medical history  of H. pylori gastritis( treated), cholelithiasis, history of choledocholithiasis status post ERCP and sphincterotomy March 2021, HTN, diverticular disease s/p right colon resection in 2009  Additional medical history as listed in [place] .   Patient was last seen by Dr. Fuller Plan November 2021 at that time for evaluation of persistently elevated liver chemistries and follow up on H. pylori gastritis.  She had been treated for H. pylori and follow-up stool antigen 12/11/2019 was negative.  Patient was seen in the ED 11/14/2020 for evaluation of acute LUQ pain which had started the day prior. Pain was constant , non-radiating. She had associated nausea / vomiting. Prior to onset of symptoms patient had been in her usual state of health. Her appetite was normal, weight stable.  ED labs remarkable for WBC of 15.7, hgb of 10.2, down from baseline ~ 12. Platelets 428. Her liver chemistries were normal.   No blood in stool.  UA suspicious for UTI. CTAP remarkable for LUQ mass. Prescribed Keflex for UTI.      PCP repeated CBC on 10/7,  WBC down to 10. 4.  LUQ pain has resolved, no longer needs pain meds given by ED. Urine culture >> mixed flora.  Repeat CBC by PCP on 11/7 >> WBC improved to 10.4. Hgb down further from 10.2 to 9.3.   CTAP w/ contrast  --large mass in the LUQ along the greater curvature of the stomach. This measures 7.7 x 6.4 cm. This  is most compatible with GIST. Large and small bowel grossly unremarkable. No bowel obstruction.  Cholelithiasis, uterine fibroids, aortic atherosclerosis   PREVIOUS ENDOSCOPIC EVALUATIONS / PERTINENT STUDIES:    EGD Sept 2021 for epigastric pain  --mild antral  erythema.  Surgical [P], gastric antrum bx - GASTRIC ANTRAL AND OXYNTIC MUCOSA WITH HELICOBACTER PYLORI-ASSOCIATED GASTRITIS (CONFIRMED WITH WARTHIN STARRY STAIN) - INTESTINAL METAPLASIA OF ANTRAL MUCOSA, NEGATIVE FOR DYSPLASIA   Current Medications, Allergies, Past Medical History, Past Surgical History, Family History and Social History were reviewed in [employer] record.     Current Outpatient Medications  Medication Sig Dispense Refill   acetaminophen (TYLENOL) 650 MG CR tablet Take 1,300 mg by mouth in the morning and at bedtime.     amLODipine (NORVASC) 5 MG tablet Take 1 tablet (5 mg total) by mouth daily. 90 tablet 3   aspirin EC 81 MG tablet Take 81 mg by mouth daily.     atorvastatin (LIPITOR) 40 MG tablet TAKE 1 TABLET BY MOUTH ONCE DAILY AT  6PM 90 tablet 1   Calcium Carb-Cholecalciferol (CALCIUM 600 + D PO) Take 1 tablet by mouth daily.     carboxymethylcellulose (REFRESH PLUS) 0.5 % SOLN Place 1 drop into both eyes 3 (three) times daily as needed (dry eyes).      celecoxib (CELEBREX) 200 MG  capsule Take 200 mg by mouth daily.     Cholecalciferol (VITAMIN D-3) 1000 UNITS CAPS Take 1,000 Units by mouth daily.      ciprofloxacin (CIPRO) 500 MG tablet Take 1 tablet (500 mg total) by mouth 2 (two) times daily for 7 days. 14 tablet 0   dorzolamide-timolol (COSOPT) 22.3-6.8 MG/ML ophthalmic solution Place 1 drop into both eyes 2 (two) times daily.     gabapentin (NEURONTIN) 300 MG capsule Take 1 capsule (300 mg total) by mouth at bedtime. 90 capsule 1   Homeopathic Products (THERAWORX RELIEF EX) Apply 1 application topically daily as needed (pain).     latanoprost (XALATAN) 0.005 % ophthalmic  solution Place 1 drop into both eyes at bedtime.     lisinopril (ZESTRIL) 30 MG tablet Take 1 tablet (30 mg total) by mouth daily. 90 tablet 1   metFORMIN (GLUCOPHAGE) 500 MG tablet TAKE 1/2 (ONE-HALF) TABLET BY MOUTH IN THE EVENING WITH SUPPER 45 tablet 2   Multiple Vitamin (MULTIVITAMIN) tablet Take 1 tablet by mouth daily.     Omega-3 Fatty Acids (OMEGA 3 PO) Take 1,200 mg by mouth daily.     ondansetron (ZOFRAN) 4 MG tablet Take 1 tablet (4 mg total) by mouth every 8 (eight) hours as needed for nausea or vomiting. 20 tablet 0   oxyCODONE-acetaminophen (PERCOCET/ROXICET) 5-325 MG tablet Take 1 tablet by mouth every 6 (six) hours as needed for severe pain. 15 tablet 0   pantoprazole (PROTONIX) 40 MG tablet TAKE 1 TABLET BY MOUTH ONCE DAILY **NEEDS  APPT  FOR  FURHTER  REFILLS 30 tablet 0   saccharomyces boulardii (FLORASTOR) 250 MG capsule Take 1 capsule (250 mg total) by mouth 2 (two) times daily for 10 days. 20 capsule 0   trolamine salicylate (ASPERCREME) 10 % cream Apply 1 application topically as needed for muscle pain.     No current facility-administered medications for this visit.    Review of Systems: No chest pain. No shortness of breath. No urinary complaints.   PHYSICAL EXAM :    Wt Readings from Last 3 Encounters:  11/25/20 189 lb 6.4 oz (85.9 kg)  11/18/20 187 lb 9.6 oz (85.1 kg)  11/14/20 186 lb (84.4 kg)    BP 100/60   Pulse 76   Ht 5\' 6"  (1.676 m)   Wt 189 lb 6.4 oz (85.9 kg)   BMI 30.57 kg/m  Constitutional:  Generally well appearing female in no acute distress. Psychiatric: Pleasant. Normal mood and affect. Behavior is normal. EENT: Pupils normal.  Conjunctivae are normal. No scleral icterus. Cardiovascular: Normal rate, regular rhythm. No edema Pulmonary/chest: Effort normal and breath sounds normal. No wheezing, rales or rhonchi. Abdominal: Soft, nondistended, nontender. Bowel sounds active throughout. There are no masses palpable. No  hepatomegaly. Neurological: Alert and oriented to person place and time.   Alison Savoy, NP  11/25/2020, 1:29 PM

## 2020-11-25 NOTE — Patient Instructions (Signed)
If you are age 76 or older, your body mass index should be between 23-30. Your Body mass index is 30.57 kg/m. If this is out of the aforementioned range listed, please consider follow up with your Primary Care Provider. ________________________________________________________  The [facility] providers would like to encourage you to use [facility] to communicate with providers for non-urgent requests or questions.  Due to long hold times on the telephone, sending your provider a message by [facility] may be a faster and more efficient way to get a response.  Please allow 48 business hours for a response.  Please remember that this is for non-urgent requests.  _______________________________________________________  Dennis Bast have been scheduled for an endoscopy. Please follow written instructions given to you at your visit today. If you use inhalers (even only as needed), please bring them with you on the day of your procedure.  Follow up pending the results of your Endoscopy.  Thank you for entrusting me with your care and choosing [facility].  Amy Esterwood, PA-C

## 2020-11-26 NOTE — Progress Notes (Signed)
Reviewed and agree with management plan.  Naketa Daddario T. Avira Tillison, MD FACG 

## 2020-12-03 ENCOUNTER — Ambulatory Visit (AMBULATORY_SURGERY_CENTER): Payer: Medicare Other | Admitting: Gastroenterology

## 2020-12-03 ENCOUNTER — Encounter: Payer: Self-pay | Admitting: Gastroenterology

## 2020-12-03 VITALS — BP 134/75 | HR 84 | Temp 97.7°F | Resp 13 | Ht 66.0 in | Wt 189.0 lb

## 2020-12-03 DIAGNOSIS — K3189 Other diseases of stomach and duodenum: Secondary | ICD-10-CM | POA: Diagnosis not present

## 2020-12-03 DIAGNOSIS — E119 Type 2 diabetes mellitus without complications: Secondary | ICD-10-CM | POA: Diagnosis not present

## 2020-12-03 DIAGNOSIS — R9389 Abnormal findings on diagnostic imaging of other specified body structures: Secondary | ICD-10-CM

## 2020-12-03 DIAGNOSIS — R1012 Left upper quadrant pain: Secondary | ICD-10-CM

## 2020-12-03 DIAGNOSIS — R933 Abnormal findings on diagnostic imaging of other parts of digestive tract: Secondary | ICD-10-CM | POA: Diagnosis not present

## 2020-12-03 MED ORDER — SODIUM CHLORIDE 0.9 % IV SOLN: 500.0000 mL | Freq: Once | INTRAVENOUS | Status: DC

## 2020-12-03 NOTE — Patient Instructions (Signed)
Impression/Recommendations:  Resume previous diet. Continue present medications. Refer to a surgeon, [facility] at the next available appointment.  YOU HAD AN ENDOSCOPIC PROCEDURE TODAY AT [facility]:   Refer to the procedure report that was given to you for any specific questions about what was found during the examination.  If the procedure report does not answer your questions, please call your gastroenterologist to clarify.  If you requested that your care partner not be given the details of your procedure findings, then the procedure report has been included in a sealed envelope for you to review at your convenience later.  YOU SHOULD EXPECT: Some feelings of bloating in the abdomen. Passage of more gas than usual.  Walking can help get rid of the air that was put into your GI tract during the procedure and reduce the bloating. If you had a lower endoscopy (such as a colonoscopy or flexible sigmoidoscopy) you may notice spotting of blood in your stool or on the toilet paper. If you underwent a bowel prep for your procedure, you may not have a normal bowel movement for a few days.  Please Note:  You might notice some irritation and congestion in your nose or some drainage.  This is from the oxygen used during your procedure.  There is no need for concern and it should clear up in a day or so.  SYMPTOMS TO REPORT IMMEDIATELY:  Following upper endoscopy (EGD)  Vomiting of blood or coffee ground material  New chest pain or pain under the shoulder blades  Painful or persistently difficult swallowing  New shortness of breath  Fever of 100F or higher  Black, tarry-looking stools  For urgent or emergent issues, a gastroenterologist can be reached at any hour by calling [phone]. Do not use MyChart messaging for urgent concerns.    DIET:  We do recommend a small meal at first, but then you may proceed to your regular diet.  Drink plenty of fluids but you should avoid alcoholic  beverages for 24 hours.  ACTIVITY:  You should plan to take it easy for the rest of today and you should NOT DRIVE or use heavy machinery until tomorrow (because of the sedation medicines used during the test).    FOLLOW UP: Our staff will call the number listed on your records 48-72 hours following your procedure to check on you and address any questions or concerns that you may have regarding the information given to you following your procedure. If we do not reach you, we will leave a message.  We will attempt to reach you two times.  During this call, we will ask if you have developed any symptoms of COVID 19. If you develop any symptoms (ie: fever, flu-like symptoms, shortness of breath, cough etc.) before then, please call [phone].  If you test positive for Covid 19 in the 2 weeks post procedure, please call and report this information to [place].    If any biopsies were taken you will be contacted by phone or by letter within the next 1-3 weeks.  Please call us at [phone] if you have not heard about the biopsies in 3 weeks.    SIGNATURES/CONFIDENTIALITY: You and/or your care partner have signed paperwork which will be entered into your electronic medical record.  These signatures attest to the fact that that the information above on your After Visit Summary has been reviewed and is understood.  Full responsibility of the confidentiality of this discharge information lies with  you and/or your care-partner.

## 2020-12-03 NOTE — Op Note (Signed)
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 The patient's tolerance of                            previous anesthesia was also reviewed. The risks                            and benefits of the procedure and the sedation                            options and risks were discussed with the patient.                            All questions were answered, and informed consent                            was obtained. Prior Anticoagulants: The patient has                            taken no previous anticoagulant or antiplatelet                            agents. ASA Grade Assessment: II - A patient with                            mild systemic disease. After reviewing the risks                            and benefits, the patient was deemed in  satisfactory condition to undergo the procedure.                           After obtaining informed consent, the endoscope was                            passed under direct vision. Throughout the                            procedure, the patient's blood pressure, pulse, and                            oxygen saturations were monitored continuously. The                            Endoscope was  introduced through the mouth, and                            advanced to the second part of duodenum. The upper                            GI endoscopy was accomplished without difficulty.                            The patient tolerated the procedure well. Scope In: Scope Out: Findings:                 The esophagus was normal.                           The stomach was normal.                           The examined duodenum was normal.                           The cardia and gastric fundus were normal on                            retroflexion. Complications:            No immediate complications. Estimated Blood Loss:     Estimated blood loss: none. Impression:               - Normal esophagus.                           - Normal stomach.                           - Normal examined duodenum.                           - No specimens collected. Recommendation:           - Patient has a contact number available for  emergencies. The signs and symptoms of potential                            delayed complications were discussed with the                            patient. Return to normal activities tomorrow.                            Written discharge instructions were provided to the                            patient.                           - Resume previous diet.                           - Continue present medications.                           - Refer to a surgeon, [facility] at the next available                            appointment. Ladene Artist, MD 12/03/2020 4:12:41 PM This report has been signed electronically.

## 2020-12-03 NOTE — Progress Notes (Signed)
See 11/25/2020 H&P, no changes.

## 2020-12-03 NOTE — Progress Notes (Signed)
Report given to PACU, vss 

## 2020-12-03 NOTE — Progress Notes (Signed)
1559 Robinul 0.1 mg IV given due large amount of secretions upon assessment.  MD made aware, vss

## 2020-12-03 NOTE — Progress Notes (Signed)
Pt's states no medical or surgical changes since previsit or office visit.   Dr. Fuller Plan made aware of cbg=64 verbal order received to hang dextrose 10%,pt.denies s/s of hypoglycemia. 1530 cbg=96  CHECK-IN-AM  V/S-CW

## 2020-12-06 ENCOUNTER — Other Ambulatory Visit: Payer: Self-pay | Admitting: Family

## 2020-12-06 DIAGNOSIS — I10 Essential (primary) hypertension: Secondary | ICD-10-CM

## 2020-12-09 DIAGNOSIS — B078 Other viral warts: Secondary | ICD-10-CM | POA: Diagnosis not present

## 2020-12-10 ENCOUNTER — Other Ambulatory Visit: Payer: Self-pay | Admitting: Gastroenterology

## 2020-12-10 ENCOUNTER — Telehealth: Payer: Self-pay

## 2020-12-10 NOTE — Telephone Encounter (Signed)
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  Patient will be contacted directly by [facility] with an appointment date and time.

## 2020-12-19 DIAGNOSIS — D214 Benign neoplasm of connective and other soft tissue of abdomen: Secondary | ICD-10-CM | POA: Diagnosis not present

## 2020-12-19 DIAGNOSIS — D649 Anemia, unspecified: Secondary | ICD-10-CM | POA: Diagnosis not present

## 2020-12-19 DIAGNOSIS — K432 Incisional hernia without obstruction or gangrene: Secondary | ICD-10-CM | POA: Diagnosis not present

## 2021-01-01 ENCOUNTER — Other Ambulatory Visit (HOSPITAL_COMMUNITY): Payer: Self-pay

## 2021-01-01 NOTE — Progress Notes (Addendum)
COVID swab appointment: 01/10/21 @ 9:15 AM  COVID Vaccine Completed:yes x5 Date COVID Vaccine completed: 03/25/19, 04/18/19 Has received booster: 11/01/19, 05/23/20, 10/07/20 COVID vaccine manufacturer: Pfizer      Date of COVID positive in last 90 days: no  PCP - Marlowe Sax, NP Cardiologist - n/a  Chest x-ray - 07/29/20 Epic EKG - 07/29/20 Epic Stress Test - n/a ECHO - n/a Cardiac Cath - n/a Pacemaker/ICD device last checked: n/a Spinal Cord Stimulator: n/a  Sleep Study - n/a CPAP -   Fasting Blood Sugar - doesn't check at home, gets blood work every 3 months Checks Blood Sugar __ times a day  Blood Thinner Instructions: Aspirin Instructions: ASA 81, no instructions will call MD Last Dose:  Activity level: Can go up a flight of stairs and perform activities of daily living without stopping and without symptoms of chest pain.       Anesthesia review: HTN, DM2, Hgb 8.7 01/03/21  Patient denies shortness of breath, fever, cough and chest pain at PAT appointment   Patient verbalized understanding of instructions that were given to them at the PAT appointment. Patient was also instructed that they will need to review over the PAT instructions again at home before surgery.

## 2021-01-02 NOTE — Patient Instructions (Addendum)
DUE TO COVID-19 ONLY ONE VISITOR IS ALLOWED TO COME WITH YOU AND STAY IN THE WAITING ROOM ONLY DURING PRE OP AND PROCEDURE.   **NO VISITORS ARE ALLOWED IN THE SHORT STAY AREA OR RECOVERY ROOM!!**  IF YOU WILL BE ADMITTED INTO THE HOSPITAL YOU ARE ALLOWED ONLY TWO SUPPORT PEOPLE DURING VISITATION HOURS ONLY (10AM -8PM)   The support person(s) may change daily. The support person(s) must pass our screening, gel in and out, and wear a mask at all times, including in the patients room. Patients must also wear a mask when staff or their support person are in the room.  No visitors under the age of 29. Any visitor under the age of 90 must be accompanied by an adult.    COVID SWAB TESTING MUST BE COMPLETED ON: Friday 01/10/21 @ 9:15 AM    Come to [place] Entrance. Have a seat in the lobby to the right. Call [phone], tell them your name and let them know you are here for a COVID test. You are not required to quarantine, however you are required to wear a well-fitted mask when you are out and around people not in your household.  Hand Hygiene often Do NOT share personal items Notify your provider if you are in close contact with someone who has COVID or you develop fever 100.4 or greater, new onset of sneezing, cough, sore throat, shortness of breath or body aches.       Your procedure is scheduled on: Tuesday 01/14/21   Report to [facility] Main Entrance    Report to admitting at 10:45 AM   Call this number if you have problems the morning of surgery [phone]   Do not eat food :After Midnight.   May have liquids until 10:00 AM day of surgery  CLEAR LIQUID DIET  Foods Allowed                                                                     Foods Excluded  Water, Black Coffee and tea (no milk or creamer)           liquids that you cannot  Plain Jell-O in any flavor  (No red)                                    see through such as: Fruit ices (not with  fruit pulp)                                            milk, soups, orange juice              Iced Popsicles (No red)                                               All solid food  Apple juices Sports drinks like Gatorade (No red) Lightly seasoned clear broth or consume(fat free) Sugar   Oral Hygiene is also important to reduce your risk of infection.                                    Remember - BRUSH YOUR TEETH THE MORNING OF SURGERY WITH YOUR REGULAR TOOTHPASTE   Stop taking all vitamins and supplements 7 days before surgery.   Call your doctors office and ask for instructions regarding Aspirin before surgery.   Take these medicines the morning of surgery with A SIP OF WATER: Tylenol, Amlodipine, Protonix  DO NOT TAKE ANY ORAL DIABETIC MEDICATIONS DAY OF YOUR SURGERY  How to Manage Your Diabetes Before and After Surgery  Why is it important to control my blood sugar before and after surgery? Improving blood sugar levels before and after surgery helps healing and can limit problems. A way of improving blood sugar control is eating a healthy diet by:  Eating less sugar and carbohydrates  Increasing activity/exercise  Talking with your doctor about reaching your blood sugar goals High blood sugars (greater than 180 mg/dL) can raise your risk of infections and slow your recovery, so you will need to focus on controlling your diabetes during the weeks before surgery. Make sure that the doctor who takes care of your diabetes knows about your planned surgery including the date and location.  How do I manage my blood sugar before surgery? Check your blood sugar at least 4 times a day, starting 2 days before surgery, to make sure that the level is not too high or low. Check your blood sugar the morning of your surgery when you wake up and every 2 hours until you get to the Short Stay unit. If your blood sugar is less than 70 mg/dL, you will need to treat for  low blood sugar: Do not take insulin. Treat a low blood sugar (less than 70 mg/dL) with  cup of clear juice (cranberry or apple), 4 glucose tablets, OR glucose gel. Recheck blood sugar in 15 minutes after treatment (to make sure it is greater than 70 mg/dL). If your blood sugar is not greater than 70 mg/dL on recheck, call [phone] for further instructions. Report your blood sugar to the short stay nurse when you get to Short Stay.  If you are admitted to the hospital after surgery: Your blood sugar will be checked by the staff and you will probably be given insulin after surgery (instead of oral diabetes medicines) to make sure you have good blood sugar levels. The goal for blood sugar control after surgery is 80-180 mg/dL.   WHAT DO I DO ABOUT MY DIABETES MEDICATION?  Do not take oral diabetes medicines (pills) the morning of surgery.  THE DAY BEFORE SURGERY, take Metformin as prescribed       THE MORNING OF SURGERY, do not take Metformin    Reviewed and Endorsed by [facility] Patient Education Committee, August 2015                               You may not have any metal on your body including hair pins, jewelry, and body piercing             Do not wear make-up, lotions, powders, perfumes, or deodorant  Do not wear nail polish including  gel and S&S, artificial/acrylic nails, or any other type of covering on natural nails including finger and toenails. If you have artificial nails, gel coating, etc. that needs to be removed by a nail salon please have this removed prior to surgery or surgery may need to be canceled/ delayed if the surgeon/ anesthesia feels like they are unable to be safely monitored.   Do not shave  48 hours prior to surgery.    Do not bring valuables to the hospital. [place].   Contacts, dentures or bridgework may not be worn into surgery.   Bring small overnight bag day of surgery.               Please read over the following fact sheets you were given: IF YOU HAVE QUESTIONS ABOUT YOUR PRE-OP INSTRUCTIONS PLEASE CALL [place] - Preparing for Surgery Before surgery, you can play an important role.  Because skin is not sterile, your skin needs to be as free of germs as possible.  You can reduce the number of germs on your skin by washing with CHG (chlorahexidine gluconate) soap before surgery.  CHG is an antiseptic cleaner which kills germs and bonds with the skin to continue killing germs even after washing. Please DO NOT use if you have an allergy to CHG or antibacterial soaps.  If your skin becomes reddened/irritated stop using the CHG and inform your nurse when you arrive at Short Stay. Do not shave (including legs and underarms) for at least 48 hours prior to the first CHG shower.  You may shave your face/neck.  Please follow these instructions carefully:  1.  Shower with CHG Soap the night before surgery and the  morning of surgery.  2.  If you choose to wash your hair, wash your hair first as usual with your normal  shampoo.  3.  After you shampoo, rinse your hair and body thoroughly to remove the shampoo.                             4.  Use CHG as you would any other liquid soap.  You can apply chg directly to the skin and wash.  Gently with a scrungie or clean washcloth.  5.  Apply the CHG Soap to your body ONLY FROM THE NECK DOWN.   Do   not use on face/ open                           Wound or open sores. Avoid contact with eyes, ears mouth and   genitals (private parts).                       Wash face,  Genitals (private parts) with your normal soap.             6.  Wash thoroughly, paying special attention to the area where your    surgery  will be performed.  7.  Thoroughly rinse your body with warm water from the neck down.  8.  DO NOT shower/wash with your normal soap after using and rinsing off the CHG Soap.                9.  Pat yourself dry with  a clean towel.  10.  Wear clean pajamas.            11.  Place clean sheets on your bed the night of your first shower and do not  sleep with pets. Day of Surgery : Do not apply any lotions/deodorants the morning of surgery.  Please wear clean clothes to the hospital/surgery center.  FAILURE TO FOLLOW THESE INSTRUCTIONS MAY RESULT IN THE CANCELLATION OF YOUR SURGERY  PATIENT SIGNATURE_________________________________  NURSE SIGNATURE__________________________________  ________________________________________________________________________

## 2021-01-02 NOTE — Progress Notes (Signed)
Please place orders for PAT appointment scheduled 01/03/21.

## 2021-01-03 ENCOUNTER — Encounter (HOSPITAL_COMMUNITY): Payer: Self-pay

## 2021-01-03 ENCOUNTER — Other Ambulatory Visit: Payer: Self-pay

## 2021-01-03 ENCOUNTER — Encounter (HOSPITAL_COMMUNITY)
Admission: RE | Admit: 2021-01-03 | Discharge: 2021-01-03 | Disposition: A | Discharge status: Home / Self Care | Payer: Medicare Other | Source: Ambulatory Visit | Attending: Surgery | Admitting: Surgery

## 2021-01-03 VITALS — BP 106/67 | HR 85 | Temp 98.0°F | Resp 16 | Ht 65.0 in | Wt 186.0 lb

## 2021-01-03 DIAGNOSIS — E1149 Type 2 diabetes mellitus with other diabetic neurological complication: Secondary | ICD-10-CM

## 2021-01-03 DIAGNOSIS — Z01812 Encounter for preprocedural laboratory examination: Secondary | ICD-10-CM | POA: Diagnosis not present

## 2021-01-03 DIAGNOSIS — Z01818 Encounter for other preprocedural examination: Secondary | ICD-10-CM

## 2021-01-03 DIAGNOSIS — I10 Essential (primary) hypertension: Secondary | ICD-10-CM | POA: Diagnosis not present

## 2021-01-03 LAB — CBC
HCT: 27.6 % — ABNORMAL LOW (ref 36.0–46.0)
Hemoglobin: 8.7 g/dL — ABNORMAL LOW (ref 12.0–15.0)
MCH: 27.9 pg (ref 26.0–34.0)
MCHC: 31.5 g/dL (ref 30.0–36.0)
MCV: 88.5 fL (ref 80.0–100.0)
Platelets: 558 10*3/uL — ABNORMAL HIGH (ref 150–400)
RBC: 3.12 MIL/uL — ABNORMAL LOW (ref 3.87–5.11)
RDW: 14 % (ref 11.5–15.5)
WBC: 12.3 10*3/uL — ABNORMAL HIGH (ref 4.0–10.5)
nRBC: 0 % (ref 0.0–0.2)

## 2021-01-03 LAB — BASIC METABOLIC PANEL
Anion gap: 7 (ref 5–15)
BUN: 15 mg/dL (ref 8–23)
CO2: 21 mmol/L — ABNORMAL LOW (ref 22–32)
Calcium: 9.2 mg/dL (ref 8.9–10.3)
Chloride: 106 mmol/L (ref 98–111)
Creatinine, Ser: 1.23 mg/dL — ABNORMAL HIGH (ref 0.44–1.00)
GFR, Estimated: 46 mL/min — ABNORMAL LOW (ref >60)
Glucose, Bld: 119 mg/dL — ABNORMAL HIGH (ref 70–99)
Potassium: 4.1 mmol/L (ref 3.5–5.1)
Sodium: 134 mmol/L — ABNORMAL LOW (ref 135–145)

## 2021-01-03 LAB — HEMOGLOBIN A1C
Hgb A1c MFr Bld: 5.5 % (ref 4.8–5.6)
Mean Plasma Glucose: 111.15 mg/dL

## 2021-01-03 LAB — GLUCOSE, CAPILLARY: Glucose-Capillary: 129 mg/dL — ABNORMAL HIGH (ref 70–99)

## 2021-01-10 ENCOUNTER — Other Ambulatory Visit: Payer: Self-pay

## 2021-01-10 ENCOUNTER — Encounter (HOSPITAL_COMMUNITY)
Admission: RE | Admit: 2021-01-10 | Discharge: 2021-01-10 | Disposition: A | Discharge status: Home / Self Care | Payer: Medicare Other | Source: Ambulatory Visit | Attending: Surgery | Admitting: Surgery

## 2021-01-10 DIAGNOSIS — Z01812 Encounter for preprocedural laboratory examination: Secondary | ICD-10-CM | POA: Diagnosis not present

## 2021-01-10 DIAGNOSIS — Z01818 Encounter for other preprocedural examination: Secondary | ICD-10-CM

## 2021-01-10 DIAGNOSIS — Z20822 Contact with and (suspected) exposure to covid-19: Secondary | ICD-10-CM | POA: Diagnosis not present

## 2021-01-10 LAB — SARS CORONAVIRUS 2 (TAT 6-24 HRS): SARS Coronavirus 2: NEGATIVE

## 2021-01-12 DIAGNOSIS — C801 Malignant (primary) neoplasm, unspecified: Secondary | ICD-10-CM

## 2021-01-12 HISTORY — DX: Malignant (primary) neoplasm, unspecified: C80.1

## 2021-01-14 ENCOUNTER — Encounter (HOSPITAL_COMMUNITY): Payer: Self-pay | Admitting: Surgery

## 2021-01-14 ENCOUNTER — Encounter (HOSPITAL_COMMUNITY)
Admission: RE | Disposition: A | Discharge status: Home / Self Care | Payer: Self-pay | Source: Ambulatory Visit | Attending: Surgery

## 2021-01-14 ENCOUNTER — Other Ambulatory Visit: Payer: Self-pay

## 2021-01-14 ENCOUNTER — Inpatient Hospital Stay (HOSPITAL_COMMUNITY): Payer: Medicare Other | Admitting: Physician Assistant

## 2021-01-14 ENCOUNTER — Inpatient Hospital Stay (HOSPITAL_COMMUNITY): Payer: Medicare Other | Admitting: Certified Registered Nurse Anesthetist

## 2021-01-14 ENCOUNTER — Inpatient Hospital Stay (HOSPITAL_COMMUNITY)
Admission: RE | Admit: 2021-01-14 | Discharge: 2021-01-17 | DRG: 327 | Disposition: A | Discharge status: Home / Self Care | Payer: Medicare Other | Source: Ambulatory Visit | Attending: Surgery | Admitting: Surgery

## 2021-01-14 DIAGNOSIS — C49A0 Gastrointestinal stromal tumor, unspecified site: Secondary | ICD-10-CM | POA: Diagnosis present

## 2021-01-14 DIAGNOSIS — E669 Obesity, unspecified: Secondary | ICD-10-CM | POA: Diagnosis not present

## 2021-01-14 DIAGNOSIS — Z6830 Body mass index (BMI) 30.0-30.9, adult: Secondary | ICD-10-CM

## 2021-01-14 DIAGNOSIS — D510 Vitamin B12 deficiency anemia due to intrinsic factor deficiency: Secondary | ICD-10-CM | POA: Diagnosis not present

## 2021-01-14 DIAGNOSIS — Z823 Family history of stroke: Secondary | ICD-10-CM | POA: Diagnosis not present

## 2021-01-14 DIAGNOSIS — I10 Essential (primary) hypertension: Secondary | ICD-10-CM | POA: Diagnosis not present

## 2021-01-14 DIAGNOSIS — K219 Gastro-esophageal reflux disease without esophagitis: Secondary | ICD-10-CM | POA: Diagnosis not present

## 2021-01-14 DIAGNOSIS — Z833 Family history of diabetes mellitus: Secondary | ICD-10-CM | POA: Diagnosis not present

## 2021-01-14 DIAGNOSIS — Z87891 Personal history of nicotine dependence: Secondary | ICD-10-CM

## 2021-01-14 DIAGNOSIS — E559 Vitamin D deficiency, unspecified: Secondary | ICD-10-CM | POA: Diagnosis present

## 2021-01-14 DIAGNOSIS — Z83438 Family history of other disorder of lipoprotein metabolism and other lipidemia: Secondary | ICD-10-CM | POA: Diagnosis not present

## 2021-01-14 DIAGNOSIS — C49A2 Gastrointestinal stromal tumor of stomach: Principal | ICD-10-CM | POA: Diagnosis present

## 2021-01-14 DIAGNOSIS — Z8249 Family history of ischemic heart disease and other diseases of the circulatory system: Secondary | ICD-10-CM

## 2021-01-14 DIAGNOSIS — E785 Hyperlipidemia, unspecified: Secondary | ICD-10-CM | POA: Diagnosis not present

## 2021-01-14 DIAGNOSIS — Z820 Family history of epilepsy and other diseases of the nervous system: Secondary | ICD-10-CM | POA: Diagnosis not present

## 2021-01-14 DIAGNOSIS — D62 Acute posthemorrhagic anemia: Secondary | ICD-10-CM | POA: Diagnosis not present

## 2021-01-14 DIAGNOSIS — E119 Type 2 diabetes mellitus without complications: Secondary | ICD-10-CM | POA: Diagnosis present

## 2021-01-14 DIAGNOSIS — K660 Peritoneal adhesions (postprocedural) (postinfection): Secondary | ICD-10-CM | POA: Diagnosis not present

## 2021-01-14 DIAGNOSIS — M8580 Other specified disorders of bone density and structure, unspecified site: Secondary | ICD-10-CM | POA: Diagnosis not present

## 2021-01-14 HISTORY — PX: OTHER SURGICAL HISTORY: SHX169

## 2021-01-14 LAB — CBC
HCT: 25.1 % — ABNORMAL LOW (ref 36.0–46.0)
Hemoglobin: 7.9 g/dL — ABNORMAL LOW (ref 12.0–15.0)
MCH: 27.5 pg (ref 26.0–34.0)
MCHC: 31.5 g/dL (ref 30.0–36.0)
MCV: 87.5 fL (ref 80.0–100.0)
Platelets: 596 10*3/uL — ABNORMAL HIGH (ref 150–400)
RBC: 2.87 MIL/uL — ABNORMAL LOW (ref 3.87–5.11)
RDW: 14.6 % (ref 11.5–15.5)
WBC: 18 10*3/uL — ABNORMAL HIGH (ref 4.0–10.5)
nRBC: 0 % (ref 0.0–0.2)

## 2021-01-14 LAB — CREATININE, SERUM
Creatinine, Ser: 0.86 mg/dL (ref 0.44–1.00)
GFR, Estimated: >60 mL/min (ref >60)

## 2021-01-14 LAB — GLUCOSE, CAPILLARY
Glucose-Capillary: 108 mg/dL — ABNORMAL HIGH (ref 70–99)
Glucose-Capillary: 154 mg/dL — ABNORMAL HIGH (ref 70–99)
Glucose-Capillary: 181 mg/dL — ABNORMAL HIGH (ref 70–99)

## 2021-01-14 SURGERY — GASTRECTOMY, PARTIAL, ROBOT-ASSISTED
Anesthesia: General | Site: Abdomen

## 2021-01-14 MED ORDER — CARBOXYMETHYLCELLULOSE SODIUM 0.5 % OP SOLN: 1.0000 [drp] | Freq: Three times a day (TID) | OPHTHALMIC | Status: DC | PRN

## 2021-01-14 MED ORDER — BUPIVACAINE-EPINEPHRINE 0.25% -1:200000 IJ SOLN
INTRAMUSCULAR | Status: DC | PRN
  Administered 2021-01-14: 30 mL

## 2021-01-14 MED ORDER — ORAL CARE MOUTH RINSE: 15.0000 mL | Freq: Once | OROMUCOSAL | Status: AC

## 2021-01-14 MED ORDER — AMLODIPINE BESYLATE 5 MG PO TABS
5.0000 mg | ORAL_TABLET | Freq: Every day | ORAL | Status: DC
  Administered 2021-01-15 – 2021-01-17 (×3): 5 mg via ORAL
  Filled 2021-01-14 (×3): qty 1

## 2021-01-14 MED ORDER — BUPIVACAINE LIPOSOME 1.3 % IJ SUSP
INTRAMUSCULAR | Status: DC | PRN
  Administered 2021-01-14: 20 mL

## 2021-01-14 MED ORDER — ONDANSETRON HCL 4 MG/2ML IJ SOLN
INTRAMUSCULAR | Status: AC
  Filled 2021-01-14: qty 2

## 2021-01-14 MED ORDER — BUPIVACAINE-EPINEPHRINE (PF) 0.25% -1:200000 IJ SOLN
INTRAMUSCULAR | Status: AC
  Filled 2021-01-14: qty 30

## 2021-01-14 MED ORDER — ROCURONIUM BROMIDE 10 MG/ML (PF) SYRINGE
PREFILLED_SYRINGE | INTRAVENOUS | Status: DC | PRN
Start: 2021-01-14 — End: 2021-01-14
  Administered 2021-01-14 (×2): 10 mg via INTRAVENOUS
  Administered 2021-01-14: 70 mg via INTRAVENOUS

## 2021-01-14 MED ORDER — CEFAZOLIN SODIUM-DEXTROSE 2-4 GM/100ML-% IV SOLN
2.0000 g | INTRAVENOUS | Status: AC
  Administered 2021-01-14: 2 g via INTRAVENOUS
  Filled 2021-01-14: qty 100

## 2021-01-14 MED ORDER — HEMOSTATIC AGENTS (NO CHARGE) OPTIME
TOPICAL | Status: DC | PRN
Start: 2021-01-14 — End: 2021-01-14
  Administered 2021-01-14: 1 via TOPICAL

## 2021-01-14 MED ORDER — ENOXAPARIN SODIUM 40 MG/0.4ML IJ SOSY
40.0000 mg | PREFILLED_SYRINGE | INTRAMUSCULAR | Status: DC
  Administered 2021-01-15 – 2021-01-17 (×2): 40 mg via SUBCUTANEOUS
  Filled 2021-01-14 (×2): qty 0.4

## 2021-01-14 MED ORDER — PHENYLEPHRINE 40 MCG/ML (10ML) SYRINGE FOR IV PUSH (FOR BLOOD PRESSURE SUPPORT)
PREFILLED_SYRINGE | INTRAVENOUS | Status: AC
  Filled 2021-01-14: qty 10

## 2021-01-14 MED ORDER — ONDANSETRON HCL 4 MG/2ML IJ SOLN: 4.0000 mg | Freq: Once | INTRAMUSCULAR | Status: DC | PRN

## 2021-01-14 MED ORDER — PROPOFOL 10 MG/ML IV BOLUS
INTRAVENOUS | Status: DC | PRN
Start: 2021-01-14 — End: 2021-01-14
  Administered 2021-01-14: 150 mg via INTRAVENOUS

## 2021-01-14 MED ORDER — LACTATED RINGERS IV SOLN: INTRAVENOUS | Status: DC

## 2021-01-14 MED ORDER — OXYCODONE HCL 5 MG PO TABS
5.0000 mg | ORAL_TABLET | ORAL | Status: DC | PRN
  Administered 2021-01-15 – 2021-01-16 (×4): 5 mg via ORAL
  Filled 2021-01-14 (×3): qty 1

## 2021-01-14 MED ORDER — ASPIRIN EC 81 MG PO TBEC
81.0000 mg | DELAYED_RELEASE_TABLET | Freq: Every day | ORAL | Status: DC
  Administered 2021-01-15 – 2021-01-17 (×3): 81 mg via ORAL
  Filled 2021-01-14 (×3): qty 1

## 2021-01-14 MED ORDER — SODIUM CHLORIDE 0.9 % IR SOLN
Status: DC | PRN
  Administered 2021-01-14: 1000 mL

## 2021-01-14 MED ORDER — LIDOCAINE HCL 4 % EX CREA
1.0000 "application " | TOPICAL_CREAM | Freq: Two times a day (BID) | CUTANEOUS | Status: DC | PRN
  Filled 2021-01-14: qty 1

## 2021-01-14 MED ORDER — ACETAMINOPHEN 325 MG PO TABS
650.0000 mg | ORAL_TABLET | Freq: Four times a day (QID) | ORAL | Status: DC
  Administered 2021-01-15 – 2021-01-17 (×7): 650 mg via ORAL
  Filled 2021-01-14 (×7): qty 2

## 2021-01-14 MED ORDER — ESMOLOL HCL 100 MG/10ML IV SOLN
INTRAVENOUS | Status: AC
  Filled 2021-01-14: qty 10

## 2021-01-14 MED ORDER — ESMOLOL HCL 100 MG/10ML IV SOLN
INTRAVENOUS | Status: DC | PRN
  Administered 2021-01-14: 30 mg via INTRAVENOUS

## 2021-01-14 MED ORDER — CHLORHEXIDINE GLUCONATE CLOTH 2 % EX PADS: 6.0000 | MEDICATED_PAD | Freq: Once | CUTANEOUS | Status: DC

## 2021-01-14 MED ORDER — OXYCODONE HCL 5 MG PO TABS
10.0000 mg | ORAL_TABLET | ORAL | Status: DC | PRN
  Filled 2021-01-14: qty 2

## 2021-01-14 MED ORDER — 0.9 % SODIUM CHLORIDE (POUR BTL) OPTIME
TOPICAL | Status: DC | PRN
  Administered 2021-01-14: 1000 mL

## 2021-01-14 MED ORDER — LIDOCAINE 2% (20 MG/ML) 5 ML SYRINGE
INTRAMUSCULAR | Status: DC | PRN
Start: 2021-01-14 — End: 2021-01-14
  Administered 2021-01-14: 100 mg via INTRAVENOUS

## 2021-01-14 MED ORDER — BUPIVACAINE LIPOSOME 1.3 % IJ SUSP: 20.0000 mL | Freq: Once | INTRAMUSCULAR | Status: DC

## 2021-01-14 MED ORDER — CHLORHEXIDINE GLUCONATE CLOTH 2 % EX PADS
6.0000 | MEDICATED_PAD | Freq: Once | CUTANEOUS | Status: DC
Start: 2021-01-14 — End: 2021-01-14

## 2021-01-14 MED ORDER — ONDANSETRON HCL 4 MG/2ML IJ SOLN
INTRAMUSCULAR | Status: DC | PRN
  Administered 2021-01-14: 4 mg via INTRAVENOUS

## 2021-01-14 MED ORDER — HYDROMORPHONE HCL 1 MG/ML IJ SOLN
INTRAMUSCULAR | Status: AC
  Filled 2021-01-14: qty 2

## 2021-01-14 MED ORDER — DOCUSATE SODIUM 100 MG PO CAPS
100.0000 mg | ORAL_CAPSULE | Freq: Two times a day (BID) | ORAL | Status: DC
  Administered 2021-01-15: 100 mg via ORAL
  Filled 2021-01-14 (×2): qty 1

## 2021-01-14 MED ORDER — BUPIVACAINE LIPOSOME 1.3 % IJ SUSP
INTRAMUSCULAR | Status: AC
  Filled 2021-01-14: qty 20

## 2021-01-14 MED ORDER — LISINOPRIL 20 MG PO TABS
30.0000 mg | ORAL_TABLET | Freq: Every day | ORAL | Status: DC
  Administered 2021-01-15 – 2021-01-17 (×3): 30 mg via ORAL
  Filled 2021-01-14 (×3): qty 1

## 2021-01-14 MED ORDER — FENTANYL CITRATE (PF) 100 MCG/2ML IJ SOLN
INTRAMUSCULAR | Status: DC | PRN
Start: 2021-01-14 — End: 2021-01-14
  Administered 2021-01-14 (×3): 50 ug via INTRAVENOUS
  Administered 2021-01-14: 100 ug via INTRAVENOUS

## 2021-01-14 MED ORDER — HYDROMORPHONE HCL 1 MG/ML IJ SOLN
0.2500 mg | INTRAMUSCULAR | Status: DC | PRN
  Administered 2021-01-14 (×4): 0.5 mg via INTRAVENOUS

## 2021-01-14 MED ORDER — ROCURONIUM BROMIDE 10 MG/ML (PF) SYRINGE
PREFILLED_SYRINGE | INTRAVENOUS | Status: AC
  Filled 2021-01-14: qty 10

## 2021-01-14 MED ORDER — ONDANSETRON HCL 4 MG/2ML IJ SOLN: 4.0000 mg | Freq: Four times a day (QID) | INTRAMUSCULAR | Status: DC | PRN

## 2021-01-14 MED ORDER — POLYVINYL ALCOHOL 1.4 % OP SOLN
1.0000 [drp] | Freq: Three times a day (TID) | OPHTHALMIC | Status: DC | PRN
  Filled 2021-01-14: qty 15

## 2021-01-14 MED ORDER — DEXAMETHASONE SODIUM PHOSPHATE 10 MG/ML IJ SOLN
INTRAMUSCULAR | Status: DC | PRN
  Administered 2021-01-14: 10 mg via INTRAVENOUS

## 2021-01-14 MED ORDER — DORZOLAMIDE HCL-TIMOLOL MAL 2-0.5 % OP SOLN
1.0000 [drp] | Freq: Two times a day (BID) | OPHTHALMIC | Status: DC
  Administered 2021-01-14 – 2021-01-17 (×5): 1 [drp] via OPHTHALMIC
  Filled 2021-01-14: qty 10

## 2021-01-14 MED ORDER — ACETAMINOPHEN 10 MG/ML IV SOLN
INTRAVENOUS | Status: AC
  Filled 2021-01-14: qty 100

## 2021-01-14 MED ORDER — ACETAMINOPHEN 500 MG PO TABS: 1000.0000 mg | ORAL_TABLET | ORAL | Status: DC

## 2021-01-14 MED ORDER — SUGAMMADEX SODIUM 200 MG/2ML IV SOLN
INTRAVENOUS | Status: DC | PRN
Start: 2021-01-14 — End: 2021-01-14
  Administered 2021-01-14: 200 mg via INTRAVENOUS

## 2021-01-14 MED ORDER — PROPOFOL 10 MG/ML IV BOLUS
INTRAVENOUS | Status: AC
  Filled 2021-01-14: qty 20

## 2021-01-14 MED ORDER — LACTATED RINGERS IR SOLN
Status: DC | PRN
Start: 2021-01-14 — End: 2021-01-14
  Administered 2021-01-14: 1000 mL

## 2021-01-14 MED ORDER — DEXAMETHASONE SODIUM PHOSPHATE 10 MG/ML IJ SOLN
INTRAMUSCULAR | Status: AC
  Filled 2021-01-14: qty 1

## 2021-01-14 MED ORDER — PHENYLEPHRINE 40 MCG/ML (10ML) SYRINGE FOR IV PUSH (FOR BLOOD PRESSURE SUPPORT)
PREFILLED_SYRINGE | INTRAVENOUS | Status: DC | PRN
  Administered 2021-01-14 (×5): 80 ug via INTRAVENOUS

## 2021-01-14 MED ORDER — ATORVASTATIN CALCIUM 40 MG PO TABS
40.0000 mg | ORAL_TABLET | Freq: Every day | ORAL | Status: DC
Start: 2021-01-14 — End: 2021-01-17
  Administered 2021-01-15 – 2021-01-16 (×2): 40 mg via ORAL
  Filled 2021-01-14 (×2): qty 1

## 2021-01-14 MED ORDER — HYDROMORPHONE HCL 1 MG/ML IJ SOLN
0.5000 mg | INTRAMUSCULAR | Status: DC | PRN
  Administered 2021-01-14 – 2021-01-15 (×4): 0.5 mg via INTRAVENOUS
  Filled 2021-01-14 (×4): qty 0.5

## 2021-01-14 MED ORDER — ACETAMINOPHEN 10 MG/ML IV SOLN
1000.0000 mg | Freq: Once | INTRAVENOUS | Status: DC | PRN
  Administered 2021-01-14: 1000 mg via INTRAVENOUS

## 2021-01-14 MED ORDER — LATANOPROST 0.005 % OP SOLN
1.0000 [drp] | Freq: Every day | OPHTHALMIC | Status: DC
  Administered 2021-01-14 – 2021-01-16 (×3): 1 [drp] via OPHTHALMIC
  Filled 2021-01-14: qty 2.5

## 2021-01-14 MED ORDER — CELECOXIB 200 MG PO CAPS
200.0000 mg | ORAL_CAPSULE | Freq: Every day | ORAL | Status: DC
  Administered 2021-01-15 – 2021-01-17 (×3): 200 mg via ORAL
  Filled 2021-01-14 (×3): qty 1

## 2021-01-14 MED ORDER — PHENYLEPHRINE HCL-NACL 20-0.9 MG/250ML-% IV SOLN
INTRAVENOUS | Status: DC | PRN
  Administered 2021-01-14: 35 ug/min via INTRAVENOUS

## 2021-01-14 MED ORDER — GABAPENTIN 300 MG PO CAPS
300.0000 mg | ORAL_CAPSULE | Freq: Every day | ORAL | Status: DC
  Administered 2021-01-15 – 2021-01-16 (×2): 300 mg via ORAL
  Filled 2021-01-14 (×2): qty 1

## 2021-01-14 MED ORDER — METHOCARBAMOL 1000 MG/10ML IJ SOLN
500.0000 mg | Freq: Four times a day (QID) | INTRAVENOUS | Status: DC | PRN
  Filled 2021-01-14: qty 5

## 2021-01-14 MED ORDER — PROCHLORPERAZINE EDISYLATE 10 MG/2ML IJ SOLN: 10.0000 mg | INTRAMUSCULAR | Status: DC | PRN

## 2021-01-14 MED ORDER — SIMETHICONE 80 MG PO CHEW
80.0000 mg | CHEWABLE_TABLET | Freq: Four times a day (QID) | ORAL | Status: DC | PRN
  Administered 2021-01-17: 80 mg via ORAL
  Filled 2021-01-14: qty 1

## 2021-01-14 MED ORDER — FENTANYL CITRATE (PF) 250 MCG/5ML IJ SOLN
INTRAMUSCULAR | Status: AC
  Filled 2021-01-14: qty 5

## 2021-01-14 MED ORDER — PANTOPRAZOLE SODIUM 40 MG PO TBEC
40.0000 mg | DELAYED_RELEASE_TABLET | Freq: Every day | ORAL | Status: DC
  Administered 2021-01-15 – 2021-01-17 (×3): 40 mg via ORAL
  Filled 2021-01-14 (×3): qty 1

## 2021-01-14 MED ORDER — LIDOCAINE HCL (PF) 2 % IJ SOLN
INTRAMUSCULAR | Status: AC
  Filled 2021-01-14: qty 5

## 2021-01-14 MED ORDER — CHLORHEXIDINE GLUCONATE 0.12 % MT SOLN
15.0000 mL | Freq: Once | OROMUCOSAL | Status: AC
  Administered 2021-01-14: 15 mL via OROMUCOSAL

## 2021-01-14 SURGICAL SUPPLY — 78 items
APPLIER CLIP 5 13 M/L LIGAMAX5 (MISCELLANEOUS)
APPLIER CLIP ROT 10 11.4 M/L (STAPLE)
BAG COUNTER SPONGE SURGICOUNT (BAG) ×2 IMPLANT
BLADE SURG SZ11 CARB STEEL (BLADE) ×2 IMPLANT
CANNULA REDUC XI 12-8 STAPL (CANNULA) ×1
CANNULA REDUCER 12-8 DVNC XI (CANNULA) ×1 IMPLANT
CHLORAPREP W/TINT 26 (MISCELLANEOUS) ×3 IMPLANT
CLIP APPLIE 5 13 M/L LIGAMAX5 (MISCELLANEOUS) IMPLANT
CLIP APPLIE ROT 10 11.4 M/L (STAPLE) IMPLANT
COVER SURGICAL LIGHT HANDLE (MISCELLANEOUS) ×2 IMPLANT
COVER TIP SHEARS 8 DVNC (MISCELLANEOUS) IMPLANT
COVER TIP SHEARS 8MM DA VINCI (MISCELLANEOUS) ×1
DECANTER SPIKE VIAL GLASS SM (MISCELLANEOUS) ×1 IMPLANT
DERMABOND ADVANCED (GAUZE/BANDAGES/DRESSINGS) ×1
DERMABOND ADVANCED .7 DNX12 (GAUZE/BANDAGES/DRESSINGS) ×1 IMPLANT
DRAPE ARM DVNC X/XI (DISPOSABLE) ×4 IMPLANT
DRAPE COLUMN DVNC XI (DISPOSABLE) ×1 IMPLANT
DRAPE DA VINCI XI ARM (DISPOSABLE) ×4
DRAPE DA VINCI XI COLUMN (DISPOSABLE) ×1
ELECT PENCIL ROCKER SW 15FT (MISCELLANEOUS) ×1 IMPLANT
ELECT REM PT RETURN 15FT ADLT (MISCELLANEOUS) ×2 IMPLANT
GAUZE 4X4 16PLY ~~LOC~~+RFID DBL (SPONGE) ×2 IMPLANT
GLOVE SURG ENC MOIS LTX SZ7.5 (GLOVE) ×4 IMPLANT
GLOVE SURG UNDER LTX SZ8 (GLOVE) ×4 IMPLANT
GOWN STRL REUS W/TWL XL LVL3 (GOWN DISPOSABLE) ×4 IMPLANT
GRASPER SUT TROCAR 14GX15 (MISCELLANEOUS) ×2 IMPLANT
HEMOSTAT SNOW SURGICEL 2X4 (HEMOSTASIS) ×1 IMPLANT
IRRIG SUCT STRYKERFLOW 2 WTIP (MISCELLANEOUS) ×2
IRRIGATION SUCT STRKRFLW 2 WTP (MISCELLANEOUS) ×1 IMPLANT
KIT BASIN OR (CUSTOM PROCEDURE TRAY) ×2 IMPLANT
KIT TURNOVER KIT A (KITS) ×1 IMPLANT
LUBRICANT JELLY K Y 4OZ (MISCELLANEOUS) IMPLANT
MARKER SKIN DUAL TIP RULER LAB (MISCELLANEOUS) ×1 IMPLANT
MAT PREVALON FULL STRYKER (MISCELLANEOUS) ×1 IMPLANT
NDL SPNL 18GX3.5 QUINCKE PK (NEEDLE) ×1 IMPLANT
NEEDLE SPNL 18GX3.5 QUINCKE PK (NEEDLE) ×2 IMPLANT
OBTURATOR OPTICAL STANDARD 8MM (TROCAR) ×1
OBTURATOR OPTICAL STND 8 DVNC (TROCAR) ×1
OBTURATOR OPTICALSTD 8 DVNC (TROCAR) ×1 IMPLANT
PACK CARDIOVASCULAR III (CUSTOM PROCEDURE TRAY) ×2 IMPLANT
RELOAD STAPLE 60 2.5 WHT DVNC (STAPLE) IMPLANT
RELOAD STAPLE 60 3.5 BLU DVNC (STAPLE) IMPLANT
RELOAD STAPLER 2.5X60 WHT DVNC (STAPLE) IMPLANT
RELOAD STAPLER 3.5X60 BLU DVNC (STAPLE) ×3 IMPLANT
SCISSORS LAP 5X35 DISP (ENDOMECHANICALS) ×1 IMPLANT
SEAL CANN UNIV 5-8 DVNC XI (MISCELLANEOUS) ×3 IMPLANT
SEAL XI 5MM-8MM UNIVERSAL (MISCELLANEOUS) ×3
SEALER SYNCHRO 8 IS4000 DV (MISCELLANEOUS) ×1
SEALER SYNCHRO 8 IS4000 DVNC (MISCELLANEOUS) ×1 IMPLANT
SLEEVE GASTRECTOMY 40FR VISIGI (MISCELLANEOUS) IMPLANT
SOL ANTI FOG 6CC (MISCELLANEOUS) ×1 IMPLANT
SOLUTION ANTI FOG 6CC (MISCELLANEOUS) ×1
SOLUTION ELECTROLUBE (MISCELLANEOUS) ×1 IMPLANT
STAPLER 60 DA VINCI SURE FORM (STAPLE) ×1
STAPLER 60 SUREFORM DVNC (STAPLE) ×1 IMPLANT
STAPLER CANNULA SEAL DVNC XI (STAPLE) ×1 IMPLANT
STAPLER CANNULA SEAL XI (STAPLE) ×1
STAPLER RELOAD 2.5X60 WHITE (STAPLE)
STAPLER RELOAD 2.5X60 WHT DVNC (STAPLE)
STAPLER RELOAD 3.5X60 BLU DVNC (STAPLE) ×3
STAPLER RELOAD 3.5X60 BLUE (STAPLE) ×3
SUT ETHILON 2 0 PS N (SUTURE) ×1 IMPLANT
SUT MNCRL AB 4-0 PS2 18 (SUTURE) ×4 IMPLANT
SUT STRAFIX SYMMETRIC 1-0 12 (SUTURE) ×4
SUT VIC AB 0 CT1 27 (SUTURE) ×1
SUT VIC AB 0 CT1 27XBRD ANTBC (SUTURE) ×1 IMPLANT
SUT VIC AB 2-0 SH 18 (SUTURE) ×1 IMPLANT
SUT VIC AB 2-0 SH 27 (SUTURE)
SUT VIC AB 2-0 SH 27XBRD (SUTURE) IMPLANT
SUT VIC AB 4-0 PS2 18 (SUTURE) ×1 IMPLANT
SUTURE STRAFIX SYMMETRC 1-0 12 (SUTURE) IMPLANT
SYR 20ML LL LF (SYRINGE) ×2 IMPLANT
TOWEL OR 17X26 10 PK STRL BLUE (TOWEL DISPOSABLE) ×2 IMPLANT
TRAY FOLEY MTR SLVR 16FR STAT (SET/KITS/TRAYS/PACK) IMPLANT
TROCAR ADV FIXATION 12X100MM (TROCAR) IMPLANT
TROCAR Z-THREAD FIOS 5X100MM (TROCAR) ×2 IMPLANT
TUBE CALIBRATION LAPBAND (TUBING) IMPLANT
TUBING INSUFFLATION 10FT LAP (TUBING) ×2 IMPLANT

## 2021-01-14 NOTE — Op Note (Signed)
Patient: Alison [name] (07/13/1945, [id number])  Date of Surgery: 01/14/2021   Preoperative Diagnosis: Gastrointestinal stromal tumor   Postoperative Diagnosis: Gastrointestinal stromal tumor   Surgical Procedure: ROBOTIC POSSIBLY OPEN GASTRIC WEDGE RESECTION: 54562 (CPT)   Operative Team Members:  Surgeon(s) and Role:    * Jayvier Burgher, Nickola Major, MD - Primary   Anesthesiologist: Myrtie Soman, MD CRNA: Lollie Sails, CRNA; Gean Maidens, CRNA; Maxwell Caul, CRNA   Anesthesia: General   Fluids:  Total I/O In: 1592.9 [I.V.:1492.9; IV Piggyback:100] Out: 900 [Other:800; BWLSL:373]  Complications: None  Drains:  (19 Fr) Jackson-Pratt drain(s) with closed bulb suction in the left upper quadrant    Specimen:  ID Type Source Tests Collected by Time Destination  1 : Gastric mass Tissue PATH GI Other SURGICAL PATHOLOGY Mikena Masoner, Nickola Major, MD 01/14/2021 1423      Disposition:  PACU - hemodynamically stable.  Plan of Care: Admit to inpatient     Indications for Procedure: Alison [name] is a 76 y.o. female who presented with anemia and was found to have a gastric GIST tumor based on imaging upper endoscopy, however not biopsy-proven.  I recommended robotic, possibly open, gastric wedge resection for this GIST tumor.   The procedure itself as well as its risks, benefits and alternatives were discussed.  The risks discussed included but were not limited to the risk of infection, bleeding, damage to nearby structures.  After a full discussion and all questions answered the patient granted consent to proceed.  Findings: Large GIST tumor off the midportion of the greater curve of the stomach.  There was a large fluid cavity behind the stomach in the lesser sac consistent with a hematoma from the tumor having previously bled into this area.  This hematoma was drained and spilled into the field.  There was no rupture of the tumor itself during the case however.   Description of  Procedure:   On the date stated above the patient was taken the operating room suite and placed in supine position.  General endotracheal anesthesia was induced.  A timeout was completed verifying the correct patient, procedure, position, and equipment needed for the case.  The patient's abdomen was prepped and draped in usual sterile fashion.  I began by making a incision in the right subcostal region and inserting a 5 mm trocar into the abdomen using the optical technique.  The abdomen was insufflated to 15 mmHg.  I encountered dense adhesions between the small bowel and the anterior abdominal wall.  Was able to place another 8 mm trocar in the right lower quadrant.  The adhesions were lysed sharply using laparoscopic scissors.  It took about an hour to clear off the underside of the abdominal wall.  I then placed 2 additional trochars in the midline through the previous laparotomy scar and in the left lower quadrant.  The midline trocar was a 12 mm trocar the original trocar was upsized to a 8 mm trocar so that in total I had 4 robotic trocar incisions with the one being larger for the stapler.  I then directed my attention to the GIST tumor.  The greater curve of the stomach was mobilized.  I divided the gastroepiploic vessels using the Synchro seal working from the pylorus proximally along the greater curve of the stomach.  I encountered a large cystic fluid collection in the lesser sac of the stomach.  This was opened and a large amount of old blood drained from this.  I suspect  this is an area where the tumor had bled in the past.  I was able to mobilize the greater curve proximally and distally to the tumor so that I could perform the wedge resection.  Three blue loads of the robotic linear stapler 60 mm were used to divide the wedge of the stomach to divide the GIST tumor off of the stomach.  The staple line was oversewn using 2-0 Vicryl suture to ensure hemostasis as these tumors are quite vascular.  I  then carefully dissected the GIST tumor away from its attachments to the surrounding tissues.  The tumor was able to be fully mobilized.  I upsized the 12 mm midline trocar site into a small laparotomy to remove the tumor and avoid rupturing the tumor.  I incorporated the disfigured scar into this laparotomy to revise the scar.  I entered the abdomen without any trauma the underlying viscera.  The tumor was removed and passed off the field specimen.  The laparotomy incision was closed using two #1 stratafix at the fascial level.  Vicryl was used to reapproximate the subcutaneous tissues and Monocryl and Dermabond were used to close the skin.  I then re-inflated the abdomen and washed out the abdomen with 4 L of saline irrigation.  Snow topical hemostatic agent was placed in the area of the resection.  There was good hemostasis in the area of the resection.  A 19 French round JP drain was brought through the right upper quadrant port site and placed in the area of the resection the left upper quadrant.  Local was injected around the port sites.  The abdomen was desufflated and the skin was closed using 4-0 Monocryl and Dermabond.  All sponge needle counts were correct at the end of this case.  At the end of the case we reviewed the infection status of the case. Patient: Private Patient Elective Case Case: Elective Infection Present At Time Of Surgery (PATOS): None  Louanna Raw, MD General, Bariatric, & Minimally Invasive Surgery [facility], [place]

## 2021-01-14 NOTE — Anesthesia Postprocedure Evaluation (Signed)
Anesthesia Post Note  Patient: Alison [name]  Procedure(s) Performed: ROBOTIC POSSIBLY OPEN GASTRIC WEDGE RESECTION (Abdomen)     Patient location during evaluation: PACU Anesthesia Type: General Level of consciousness: awake and alert Pain management: pain level controlled Vital Signs Assessment: post-procedure vital signs reviewed and stable Respiratory status: spontaneous breathing, nonlabored ventilation, respiratory function stable and patient connected to nasal cannula oxygen Cardiovascular status: blood pressure returned to baseline and stable Postop Assessment: no apparent nausea or vomiting Anesthetic complications: no   No notable events documented.  Last Vitals:  Vitals:   01/14/21 1630 01/14/21 1645  BP: 120/73 124/60  Pulse: 93 86  Resp: 19 18  Temp:    SpO2: 98% 98%    Last Pain:  Vitals:   01/14/21 1630  TempSrc:   PainSc: 6                  Jamair Cato S

## 2021-01-14 NOTE — Anesthesia Preprocedure Evaluation (Signed)
Anesthesia Evaluation  Patient identified by MRN, date of birth, ID band Patient awake    Reviewed: Allergy & Precautions, NPO status , Patient's Chart, lab work & pertinent test results  Airway Mallampati: II  TM Distance: >3 FB Neck ROM: Full    Dental no notable dental hx.    Pulmonary neg pulmonary ROS, Patient abstained from smoking., former smoker,    Pulmonary exam normal breath sounds clear to auscultation       Cardiovascular hypertension, Pt. on medications Normal cardiovascular exam Rhythm:Regular Rate:Normal     Neuro/Psych negative neurological ROS  negative psych ROS   GI/Hepatic Neg liver ROS, GERD  ,  Endo/Other  diabetes, Type 2  Renal/GU negative Renal ROS  negative genitourinary   Musculoskeletal negative musculoskeletal ROS (+)   Abdominal   Peds negative pediatric ROS (+)  Hematology  (+) anemia ,   Anesthesia Other Findings   Reproductive/Obstetrics negative OB ROS                             Anesthesia Physical Anesthesia Plan  ASA: 3  Anesthesia Plan: General   Post-op Pain Management:    Induction: Intravenous  PONV Risk Score and Plan: 3 and Ondansetron, Dexamethasone and Treatment may vary due to age or medical condition  Airway Management Planned: Oral ETT  Additional Equipment:   Intra-op Plan:   Post-operative Plan: Extubation in OR  Informed Consent: I have reviewed the patients History and Physical, chart, labs and discussed the procedure including the risks, benefits and alternatives for the proposed anesthesia with the patient or authorized representative who has indicated his/her understanding and acceptance.     Dental advisory given  Plan Discussed with: CRNA and Surgeon  Anesthesia Plan Comments:         Anesthesia Quick Evaluation

## 2021-01-14 NOTE — H&P (Signed)
Admitting Physician: Nickola Major Latishia Suitt  Service: General surgery  CC: GIST Tumor  Subjective   HPI: Alison [name] is an 76 y.o. 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  Past Medical History:  Diagnosis Date   Anxiety    Benign essential hypertension    Blood transfusion without reported diagnosis    Cataract    removed bilateraly   Diabetes mellitus without complication ([place])    Diverticulitis of colon (without mention of hemorrhage)(562.11)    GERD (gastroesophageal reflux disease)    Goiter, specified as simple    Helicobacter pylori gastritis    Hyperlipidemia LDL goal < 100    Intrahepatic bile duct dilation    Numbness    TOES RT FOOT and toes left foot   Obesity, unspecified    Osteoarthrosis, unspecified whether generalized or localized, lower leg    Osteopenia    Other specified disease of nail    FUNGUS OF FINGERNAILS   Pernicious anemia    Unspecified glaucoma(365.9)    Unspecified vitamin D deficiency    Wears dentures    Wears glasses     Past Surgical History:  Procedure Laterality Date   BACK SURGERY     CATARACT EXTRACTION     right eye 06/20/15 left eye 09/12/15   COLECTOMY  2009   COLONOSCOPY  09/06/2007   Dr Carol Ada   DILATION AND CURETTAGE OF UTERUS     ENDOSCOPIC RETROGRADE CHOLANGIOPANCREATOGRAPHY (ERCP) WITH PROPOFOL N/A 03/17/2019   Procedure: ENDOSCOPIC RETROGRADE CHOLANGIOPANCREATOGRAPHY (ERCP) WITH PROPOFOL;  Surgeon: Ladene Artist, MD;  Location: [facility] ENDOSCOPY;  Service: Endoscopy;  Laterality: N/A;   JOINT REPLACEMENT     right   LUMBAR LAMINECTOMY/DECOMPRESSION MICRODISCECTOMY N/A 03/30/2013   Procedure: CENTRAL DECOMPRESSION L4 - L5 AND EXCISION 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;  Surgeon: Tobi Bastos, MD;  Location: [facility] ORS;  Service: Orthopedics;  Laterality: N/A;   MULTIPLE TOOTH EXTRACTIONS     REMOVAL OF STONES  03/17/2019   Procedure: REMOVAL OF STONES;  Surgeon: Ladene Artist, MD;  Location: [place];  Service: Endoscopy;;   SIGMOIDOSCOPY  02/16/2018   Dr Nira Conn  03/17/2019   Procedure: SPHINCTEROTOMY;  Surgeon: Ladene Artist, MD;  Location: [place];  Service: Endoscopy;;   TOTAL KNEE ARTHROPLASTY Right 02/01/2013   Procedure: RIGHT TOTAL KNEE ARTHROPLASTY;  Surgeon: Tobi Bastos, MD;  Location: [facility];  Service: Orthopedics;  Laterality: Right;   TOTAL KNEE ARTHROPLASTY Left 04/18/2014   Procedure: LEFT TOTAL KNEE ARTHROPLASTY;  Surgeon: Latanya Maudlin, MD;  Location: [facility];  Service: Orthopedics;  Laterality: Left;    Family History  Problem Relation Age of Onset   Alzheimer's disease Mother    Stroke Mother    Hyperlipidemia Mother    Heart disease Father    Diabetes Sister    Colon polyps Neg Hx    Colon cancer Neg Hx    Esophageal cancer Neg Hx    Rectal cancer Neg Hx    Stomach cancer Neg Hx     Social:  reports that she quit smoking about 22 months ago. Her smoking use included cigarettes. She has a 15.00 pack-year smoking history. She has never used smokeless tobacco. She reports that she does not drink alcohol and does not use drugs.  Allergies:  Allergies  Allergen Reactions   Aspirin Nausea Only    Can take 81 mg but can't take 325mg , hard on stomach.  Medications: Current Outpatient Medications  Medication Instructions   acetaminophen (TYLENOL) 1,300 mg, Oral, 2 times daily   amLODipine (NORVASC) 5 MG tablet Take 1 tablet by mouth once daily   aspirin EC 81 mg, Oral, Daily   atorvastatin (LIPITOR) 40 MG tablet TAKE 1 TABLET BY MOUTH ONCE DAILY AT  6PM   Calcium Carb-Cholecalciferol (CALCIUM 600 + D PO) 1 tablet, Oral, Every morning   carboxymethylcellulose (REFRESH PLUS) 0.5 % SOLN 1 drop, 3 times daily PRN   celecoxib (CELEBREX) 200 mg, Oral, Every morning   dorzolamide-timolol (COSOPT) 22.3-6.8 MG/ML ophthalmic solution 1 drop, Both Eyes, 2 times daily   gabapentin (NEURONTIN) 300 mg, Oral, Daily at bedtime    Homeopathic Products (THERAWORX RELIEF EX) 1 application, Topical, Daily PRN   latanoprost (XALATAN) 0.005 % ophthalmic solution 1 drop, Both Eyes, Daily at bedtime   Lidocaine HCl 4 % CREA 1 application, Topical, 2 times daily PRN   lisinopril (ZESTRIL) 30 mg, Oral, Daily   metFORMIN (GLUCOPHAGE) 500 MG tablet TAKE 1/2 (ONE-HALF) TABLET BY MOUTH IN THE EVENING WITH SUPPER   Multiple Vitamin (MULTIVITAMIN WITH MINERALS) TABS tablet 1 tablet, Oral, Daily   Omega-3 Fatty Acids (OMEGA 3 PO) 1,200 mg, Oral, Every morning   pantoprazole (PROTONIX) 40 mg, Oral, Daily   Vitamin D-3 1,000 Units, Oral, Every morning    ROS - all of the below systems have been reviewed with the patient and positives are indicated with bold text General: chills, fever or night sweats Eyes: blurry vision or double vision ENT: epistaxis or sore throat Allergy/Immunology: itchy/watery eyes or nasal congestion Hematologic/Lymphatic: bleeding problems, blood clots or swollen lymph nodes Endocrine: temperature intolerance or unexpected weight changes Breast: new or changing breast lumps or nipple discharge Resp: cough, shortness of breath, or wheezing CV: chest pain or dyspnea on exertion GI: as per HPI GU: dysuria, trouble voiding, or hematuria MSK: joint pain or joint stiffness Neuro: TIA or stroke symptoms Derm: pruritus and skin lesion changes Psych: anxiety and depression  Objective   PE Blood pressure 139/72, pulse 98, temperature 98.6 F (37 C), temperature source Oral, resp. rate 18, height 5\' 5"  (1.651 m), weight 84.4 kg, SpO2 100 %. Constitutional: NAD; conversant; no deformities Eyes: Moist conjunctiva; no lid lag; anicteric; PERRL Neck: Trachea midline; no thyromegaly Lungs: Normal respiratory effort; no tactile fremitus CV: RRR; no palpable thrills; no pitting edema GI: Abd Soft, non-tender; no palpable hepatosplenomegaly MSK: Normal range of motion of extremities; no  clubbing/cyanosis Psychiatric: Appropriate affect; alert and oriented x3 Lymphatic: No palpable cervical or axillary lymphadenopathy  Results for orders placed or performed during the hospital encounter of 01/14/21 (from the past 24 hour(s))  Glucose, capillary     Status: Abnormal   Collection Time: 01/14/21 11:11 AM  Result Value Ref Range   Glucose-Capillary 108 (H) 70 - 99 mg/dL    Imaging Orders  No imaging studies ordered today     Assessment and Plan   Alison [name] is an 76 y.o. female with a GIST tumor here for robotic gastric wedge resection.  The procedure itself as well as its risks, benefits and alternatives were discussed and the patient granted consent to proceed.  We will proceed as scheduled.   Felicie Morn, MD  [facility] Use [email or web address] to contact on call provider

## 2021-01-14 NOTE — Anesthesia Procedure Notes (Signed)
Procedure Name: Intubation Date/Time: 01/14/2021 12:44 PM Performed by: Maxwell Caul, CRNA Pre-anesthesia Checklist: Patient identified, Emergency Drugs available, Suction available and Patient being monitored Patient Re-evaluated:Patient Re-evaluated prior to induction Oxygen Delivery Method: Circle system utilized Preoxygenation: Pre-oxygenation with 100% oxygen Induction Type: IV induction Ventilation: Mask ventilation without difficulty Laryngoscope Size: Mac and 4 Grade View: Grade I Tube type: Oral Tube size: 7.5 mm Number of attempts: 1 Airway Equipment and Method: Stylet Placement Confirmation: ETT inserted through vocal cords under direct vision, positive ETCO2 and breath sounds checked- equal and bilateral Secured at: 21 cm Tube secured with: Tape Dental Injury: Teeth and Oropharynx as per pre-operative assessment

## 2021-01-14 NOTE — Transfer of Care (Signed)
Immediate Anesthesia Transfer of Care Note  Patient: Alison [name]  Procedure(s) Performed: ROBOTIC POSSIBLY OPEN GASTRIC WEDGE RESECTION (Abdomen)  Patient Location: PACU  Anesthesia Type:General  Level of Consciousness: awake, alert  and oriented  Airway & Oxygen Therapy: Patient Spontanous Breathing and Patient connected to face mask oxygen  Post-op Assessment: Report given to RN and Post -op Vital signs reviewed and stable  Post vital signs: Reviewed and stable  Last Vitals:  Vitals Value Taken Time  BP 121/60 01/14/21 1620  Temp 36.9 C 01/14/21 1620  Pulse 97 01/14/21 1622  Resp 21 01/14/21 1622  SpO2 92 % 01/14/21 1622  Vitals shown include unvalidated device data.  Last Pain:  Vitals:   01/14/21 1620  TempSrc:   PainSc: 6          Complications: No notable events documented.

## 2021-01-15 ENCOUNTER — Ambulatory Visit: Payer: Medicare Other | Admitting: Podiatry

## 2021-01-15 LAB — BASIC METABOLIC PANEL
Anion gap: 8 (ref 5–15)
BUN: 12 mg/dL (ref 8–23)
CO2: 22 mmol/L (ref 22–32)
Calcium: 8.8 mg/dL — ABNORMAL LOW (ref 8.9–10.3)
Chloride: 104 mmol/L (ref 98–111)
Creatinine, Ser: 0.78 mg/dL (ref 0.44–1.00)
GFR, Estimated: >60 mL/min (ref >60)
Glucose, Bld: 164 mg/dL — ABNORMAL HIGH (ref 70–99)
Potassium: 4.2 mmol/L (ref 3.5–5.1)
Sodium: 134 mmol/L — ABNORMAL LOW (ref 135–145)

## 2021-01-15 LAB — CBC
HCT: 25.6 % — ABNORMAL LOW (ref 36.0–46.0)
Hemoglobin: 7.8 g/dL — ABNORMAL LOW (ref 12.0–15.0)
MCH: 26.9 pg (ref 26.0–34.0)
MCHC: 30.5 g/dL (ref 30.0–36.0)
MCV: 88.3 fL (ref 80.0–100.0)
Platelets: 536 10*3/uL — ABNORMAL HIGH (ref 150–400)
RBC: 2.9 MIL/uL — ABNORMAL LOW (ref 3.87–5.11)
RDW: 14.7 % (ref 11.5–15.5)
WBC: 15.5 10*3/uL — ABNORMAL HIGH (ref 4.0–10.5)
nRBC: 0 % (ref 0.0–0.2)

## 2021-01-15 MED ORDER — NUTRISOURCE FIBER PO PACK
1.0000 | PACK | Freq: Two times a day (BID) | ORAL | Status: DC
  Administered 2021-01-15 – 2021-01-17 (×4): 1
  Filled 2021-01-15 (×6): qty 1

## 2021-01-15 MED ORDER — NUTRISOURCE FIBER PO PACK: 1.0000 | PACK | Freq: Two times a day (BID) | ORAL | Status: DC

## 2021-01-15 NOTE — Progress Notes (Signed)
Progress Note: General Surgery Service   Chief Complaint/Subjective: Doing well this morning.  Urinated multiple times through the night.  Ambulated to the bathroom.  Hungry - wants a big plate of grits.    Objective: Vital signs in last 24 hours: Temp:  [97.7 F (36.5 C)-98.6 F (37 C)] 98.1 F (36.7 C) (01/04 0531) Pulse Rate:  [47-98] 96 (01/04 0531) Resp:  [9-19] 18 (01/04 0531) BP: (106-139)/(57-79) 139/79 (01/04 0531) SpO2:  [90 %-100 %] 99 % (01/04 0531) Weight:  [84.4 kg] 84.4 kg (01/03 1125) Last BM Date: 01/13/21  Intake/Output from previous day: 01/03 0701 - 01/04 0700 In: 1812.9 [P.O.:120; I.V.:1492.9; IV Piggyback:200] Out: 1120 [Drains:220; Blood:100] Intake/Output this shift: No intake/output data recorded.  Constitutional: NAD; conversant; no deformities Eyes: Moist conjunctiva; no lid lag; anicteric; PERRL Neck: Trachea midline; no thyromegaly Lungs: Normal respiratory effort; no tactile fremitus CV: RRR; no palpable thrills; no pitting edema GI: Abd Incisions c/d/I with glue; no palpable hepatosplenomegaly MSK: Normal range of motion of extremities; no clubbing/cyanosis Psychiatric: Appropriate affect; alert and oriented x3 Lymphatic: No palpable cervical or axillary lymphadenopathy  Lab Results: CBC  Recent Labs    01/14/21 1905 01/15/21 0408  WBC 18.0* 15.5*  HGB 7.9* 7.8*  HCT 25.1* 25.6*  PLT 596* 536*   BMET Recent Labs    01/14/21 1905 01/15/21 0408  NA  --  134*  K  --  4.2  CL  --  104  CO2  --  22  GLUCOSE  --  164*  BUN  --  12  CREATININE 0.86 0.78  CALCIUM  --  8.8*   PT/INR No results for input(s): LABPROT, INR in the last 72 hours. ABG No results for input(s): PHART, HCO3 in the last 72 hours.  Invalid input(s): PCO2, PO2  Anti-infectives: Anti-infectives (From admission, onward)    Start     Dose/Rate Route Frequency Ordered Stop   01/14/21 1200  ceFAZolin (ANCEF) IVPB 2g/100 mL premix        2 g 200 mL/hr over  30 Minutes Intravenous On call to O.R. 01/14/21 1152 01/14/21 1244       Medications: Scheduled Meds:  acetaminophen  650 mg Oral Q6H   amLODipine  5 mg Oral Daily   aspirin EC  81 mg Oral Daily   atorvastatin  40 mg Oral q1800   celecoxib  200 mg Oral Daily   docusate sodium  100 mg Oral BID   dorzolamide-timolol  1 drop Both Eyes BID   enoxaparin (LOVENOX) injection  40 mg Subcutaneous Q24H   fiber  1 packet Per Tube BID   gabapentin  300 mg Oral QHS   latanoprost  1 drop Both Eyes QHS   lisinopril  30 mg Oral Daily   pantoprazole  40 mg Oral Daily   Continuous Infusions:  methocarbamol (ROBAXIN) IV     PRN Meds:.HYDROmorphone (DILAUDID) injection, Lidocaine HCl, methocarbamol (ROBAXIN) IV, ondansetron (ZOFRAN) IV, oxyCODONE, oxyCODONE, polyvinyl alcohol, prochlorperazine, simethicone  Assessment/Plan: Ms. 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    Felicie Morn, MD  [facility] Use [email or web address] to contact on call provider  Daily Billing: [phone] - post op

## 2021-01-15 NOTE — Plan of Care (Signed)
°  Problem: Education: Goal: Knowledge of General Education information will improve Description: Including pain rating scale, medication(s)/side effects and non-pharmacologic comfort measures 01/15/2021 0215 by Josepha Pigg, RN Outcome: Progressing 01/15/2021 0209 by Josepha Pigg, RN Outcome: Progressing   Problem: Health Behavior/Discharge Planning: Goal: Ability to manage health-related needs will improve 01/15/2021 0215 by Josepha Pigg, RN Outcome: Progressing 01/15/2021 0209 by Josepha Pigg, RN Outcome: Progressing   Problem: Clinical Measurements: Goal: Ability to maintain clinical measurements within normal limits will improve 01/15/2021 0215 by Josepha Pigg, RN Outcome: Progressing 01/15/2021 0209 by Josepha Pigg, RN Outcome: Progressing Goal: Will remain free from infection 01/15/2021 0215 by Josepha Pigg, RN Outcome: Progressing 01/15/2021 0209 by Josepha Pigg, RN Outcome: Progressing Goal: Diagnostic test results will improve 01/15/2021 0215 by Josepha Pigg, RN Outcome: Progressing 01/15/2021 0209 by Josepha Pigg, RN Outcome: Progressing Goal: Respiratory complications will improve 01/15/2021 0215 by Josepha Pigg, RN Outcome: Progressing 01/15/2021 0209 by Josepha Pigg, RN Outcome: Progressing Goal: Cardiovascular complication will be avoided 01/15/2021 0215 by Josepha Pigg, RN Outcome: Progressing 01/15/2021 0209 by Josepha Pigg, RN Outcome: Progressing   Problem: Activity: Goal: Risk for activity intolerance will decrease 01/15/2021 0215 by Josepha Pigg, RN Outcome: Progressing 01/15/2021 0209 by Josepha Pigg, RN Outcome: Progressing   Problem: Nutrition: Goal: Adequate nutrition will be maintained 01/15/2021 0215 by Josepha Pigg, RN Outcome: Progressing 01/15/2021 0209 by Josepha Pigg, RN Outcome: Progressing   Problem: Coping: Goal: Level of anxiety will decrease 01/15/2021  0215 by Josepha Pigg, RN Outcome: Progressing 01/15/2021 0209 by Josepha Pigg, RN Outcome: Progressing   Problem: Elimination: Goal: Will not experience complications related to bowel motility 01/15/2021 0215 by Josepha Pigg, RN Outcome: Progressing 01/15/2021 0209 by Josepha Pigg, RN Outcome: Progressing Goal: Will not experience complications related to urinary retention 01/15/2021 0215 by Josepha Pigg, RN Outcome: Progressing 01/15/2021 0209 by Josepha Pigg, RN Outcome: Progressing   Problem: Pain Managment: Goal: General experience of comfort will improve 01/15/2021 0215 by Josepha Pigg, RN Outcome: Progressing 01/15/2021 0209 by Josepha Pigg, RN Outcome: Progressing   Problem: Safety: Goal: Ability to remain free from injury will improve 01/15/2021 0215 by Josepha Pigg, RN Outcome: Progressing 01/15/2021 0209 by Josepha Pigg, RN Outcome: Progressing   Problem: Skin Integrity: Goal: Risk for impaired skin integrity will decrease 01/15/2021 0215 by Josepha Pigg, RN Outcome: Progressing 01/15/2021 0209 by Josepha Pigg, RN Outcome: Progressing

## 2021-01-15 NOTE — Plan of Care (Signed)

## 2021-01-15 NOTE — Progress Notes (Signed)
Redressed R  JP Site. Noted bloody drainage at the insertion site.. Patient ambulates with standby assist. Spot check CBG. Will f/u with day shift coverage to implement sliding scale.

## 2021-01-15 NOTE — Progress Notes (Signed)
°  Transition of Care [facility]) Screening Note   Patient Details  Name: Alison [name] Date of Birth: Apr 17, 1945   [facility] of [facility]) CM/SW Contact:    Lennart Pall, LCSW Phone Number: 01/15/2021, 10:23 AM    Transition of Care Department [facility]) has reviewed patient and no TOC needs have been identified at this time. We will continue to monitor patient advancement through interdisciplinary progression rounds. If new patient transition needs arise, please place a TOC consult.

## 2021-01-16 LAB — BASIC METABOLIC PANEL
Anion gap: 7 (ref 5–15)
BUN: 12 mg/dL (ref 8–23)
CO2: 25 mmol/L (ref 22–32)
Calcium: 8.8 mg/dL — ABNORMAL LOW (ref 8.9–10.3)
Chloride: 102 mmol/L (ref 98–111)
Creatinine, Ser: 0.85 mg/dL (ref 0.44–1.00)
GFR, Estimated: >60 mL/min (ref >60)
Glucose, Bld: 104 mg/dL — ABNORMAL HIGH (ref 70–99)
Potassium: 4.3 mmol/L (ref 3.5–5.1)
Sodium: 134 mmol/L — ABNORMAL LOW (ref 135–145)

## 2021-01-16 LAB — CBC
HCT: 21 % — ABNORMAL LOW (ref 36.0–46.0)
Hemoglobin: 6.8 g/dL — CL (ref 12.0–15.0)
MCH: 27.2 pg (ref 26.0–34.0)
MCHC: 32.4 g/dL (ref 30.0–36.0)
MCV: 84 fL (ref 80.0–100.0)
Platelets: 487 10*3/uL — ABNORMAL HIGH (ref 150–400)
RBC: 2.5 MIL/uL — ABNORMAL LOW (ref 3.87–5.11)
RDW: 14.6 % (ref 11.5–15.5)
WBC: 14.6 10*3/uL — ABNORMAL HIGH (ref 4.0–10.5)
nRBC: 0.1 % (ref 0.0–0.2)

## 2021-01-16 LAB — PREPARE RBC (CROSSMATCH)

## 2021-01-16 MED ORDER — SODIUM CHLORIDE 0.9% IV SOLUTION: Freq: Once | INTRAVENOUS | Status: DC

## 2021-01-16 NOTE — Progress Notes (Signed)
Progress Note: General Surgery Service   Chief Complaint/Subjective: Doing well this morning with no major complaints.  Tolerating diet.  Having bowel movements.  Abdominal pain is controlled.  Objective: Vital signs in last 24 hours: Temp:  [97.7 F (36.5 C)-98.9 F (37.2 C)] 98.4 F (36.9 C) (01/05 1450) Pulse Rate:  [71-91] 81 (01/05 1450) Resp:  [16] 16 (01/05 1450) BP: (102-129)/(51-69) 102/51 (01/05 1450) SpO2:  [95 %-99 %] 97 % (01/05 1450) Last BM Date: 01/14/21  Intake/Output from previous day: 01/04 0701 - 01/05 0700 In: 63 [P.O.:680] Out: 1225 [Urine:950; Drains:125; Stool:150] Intake/Output this shift: Total I/O In: 360 [P.O.:360] Out: 60 [Drains:60]  Constitutional: NAD; conversant; no deformities Eyes: Moist conjunctiva; no lid lag; anicteric; PERRL Neck: Trachea midline; no thyromegaly Lungs: Normal respiratory effort; no tactile fremitus CV: RRR; no palpable thrills; no pitting edema GI: Abd Incisions c/d/I with glue; no palpable hepatosplenomegaly MSK: Normal range of motion of extremities; no clubbing/cyanosis Psychiatric: Appropriate affect; alert and oriented x3 Lymphatic: No palpable cervical or axillary lymphadenopathy  Lab Results: CBC  Recent Labs    01/15/21 0408 01/16/21 0458  WBC 15.5* 14.6*  HGB 7.8* 6.8*  HCT 25.6* 21.0*  PLT 536* 487*    BMET Recent Labs    01/15/21 0408 01/16/21 0458  NA 134* 134*  K 4.2 4.3  CL 104 102  CO2 22 25  GLUCOSE 164* 104*  BUN 12 12  CREATININE 0.78 0.85  CALCIUM 8.8* 8.8*    PT/INR No results for input(s): LABPROT, INR in the last 72 hours. ABG No results for input(s): PHART, HCO3 in the last 72 hours.  Invalid input(s): PCO2, PO2  Anti-infectives: Anti-infectives (From admission, onward)    Start     Dose/Rate Route Frequency Ordered Stop   01/14/21 1200  ceFAZolin (ANCEF) IVPB 2g/100 mL premix        2 g 200 mL/hr over 30 Minutes Intravenous On call to O.R. 01/14/21 1152 01/14/21  1244       Medications: Scheduled Meds:  sodium chloride   Intravenous Once   acetaminophen  650 mg Oral Q6H   amLODipine  5 mg Oral Daily   aspirin EC  81 mg Oral Daily   atorvastatin  40 mg Oral q1800   celecoxib  200 mg Oral Daily   dorzolamide-timolol  1 drop Both Eyes BID   enoxaparin (LOVENOX) injection  40 mg Subcutaneous Q24H   fiber  1 packet Per Tube BID   gabapentin  300 mg Oral QHS   latanoprost  1 drop Both Eyes QHS   lisinopril  30 mg Oral Daily   pantoprazole  40 mg Oral Daily   Continuous Infusions:  methocarbamol (ROBAXIN) IV     PRN Meds:.HYDROmorphone (DILAUDID) injection, Lidocaine HCl, methocarbamol (ROBAXIN) IV, ondansetron (ZOFRAN) IV, oxyCODONE, oxyCODONE, polyvinyl alcohol, prochlorperazine, simethicone  Assessment/Plan: Alison [name] is a 76 year old female 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  She does say she is feels cold so  this may be considered a symptomatic anemia. -Recheck labs in the morning  Type 2 Diabetes - SSI  HTN, GERD, HLD - home meds   LOS: 2 days   FEN: Regular diet ID: None VTE: Scds, Lovenox Foley: None - voiding Dispo: Continued care on floor today, possibly home tomorrow.    Alison Morn, MD  [facility] Use [email or web address] to contact on call provider  Daily Billing: [phone] - post op

## 2021-01-16 NOTE — Progress Notes (Signed)
°   01/16/21 0900  Mobility  Activity Contraindicated/medical hold   Pt Hb >7, hold mobility until above or equal to 7.   [place] Specialist Acute Rehab Services Office: [phone]

## 2021-01-16 NOTE — Progress Notes (Signed)
Date and time results received: 01/16/21 0523 (use smartphrase ".now" to insert current time)  Test: hgb  Critical Value: hgb 6.8  Name of Provider Notified: Dr. Nedra Hai  Orders Received? Or Actions Taken?: MD aware.

## 2021-01-17 ENCOUNTER — Other Ambulatory Visit (HOSPITAL_COMMUNITY): Payer: Self-pay

## 2021-01-17 LAB — BASIC METABOLIC PANEL
Anion gap: 11 (ref 5–15)
BUN: 11 mg/dL (ref 8–23)
CO2: 20 mmol/L — ABNORMAL LOW (ref 22–32)
Calcium: 8.9 mg/dL (ref 8.9–10.3)
Chloride: 103 mmol/L (ref 98–111)
Creatinine, Ser: 0.75 mg/dL (ref 0.44–1.00)
GFR, Estimated: >60 mL/min (ref >60)
Glucose, Bld: 107 mg/dL — ABNORMAL HIGH (ref 70–99)
Potassium: 4 mmol/L (ref 3.5–5.1)
Sodium: 134 mmol/L — ABNORMAL LOW (ref 135–145)

## 2021-01-17 LAB — TYPE AND SCREEN
ABO/RH(D): B POS
Antibody Screen: NEGATIVE
Unit division: 0
Unit division: 0

## 2021-01-17 LAB — BPAM RBC
Blood Product Expiration Date: 202301212359
Blood Product Expiration Date: 202301212359
ISSUE DATE / TIME: 202301051459
ISSUE DATE / TIME: 202301052034
Unit Type and Rh: 7300
Unit Type and Rh: 7300

## 2021-01-17 LAB — CBC
HCT: 31.2 % — ABNORMAL LOW (ref 36.0–46.0)
Hemoglobin: 10.4 g/dL — ABNORMAL LOW (ref 12.0–15.0)
MCH: 27.8 pg (ref 26.0–34.0)
MCHC: 33.3 g/dL (ref 30.0–36.0)
MCV: 83.4 fL (ref 80.0–100.0)
Platelets: 487 10*3/uL — ABNORMAL HIGH (ref 150–400)
RBC: 3.74 MIL/uL — ABNORMAL LOW (ref 3.87–5.11)
RDW: 14.6 % (ref 11.5–15.5)
WBC: 13.1 10*3/uL — ABNORMAL HIGH (ref 4.0–10.5)
nRBC: 0.2 % (ref 0.0–0.2)

## 2021-01-17 LAB — GLUCOSE, CAPILLARY: Glucose-Capillary: 112 mg/dL — ABNORMAL HIGH (ref 70–99)

## 2021-01-17 MED ORDER — OXYCODONE-ACETAMINOPHEN 5-325 MG PO TABS
1.0000 | ORAL_TABLET | ORAL | 0 refills | Status: DC | PRN
  Filled 2021-01-17: qty 10, 2d supply, fill #0

## 2021-01-17 NOTE — Care Management Important Message (Signed)
Important Message  Patient Details IM Letter given to the Patient. Name: Alison [name] MRN: [id number] Date of Birth: 1945/03/25   Medicare Important Message Given:  Yes     [name] 01/17/2021, 12:15 PM

## 2021-01-17 NOTE — Progress Notes (Signed)
OT Cancellation Note  Patient Details Name: Alison [name] MRN: [id number] DOB: July 06, 1945   Cancelled Treatment:    Reason Eval/Treat Not Completed: OT screened, no needs identified, will sign off.  Lejend Dalby L Rayana Geurin 01/17/2021, 11:58 AM

## 2021-01-17 NOTE — Plan of Care (Signed)

## 2021-01-17 NOTE — Discharge Summary (Signed)
Patient ID: Alison [name] [id number] 75 y.o. 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] Patient Active Problem List   Diagnosis Date Noted   Gastrointestinal stromal tumor (GIST) (Alison [name]) 01/14/2021   Calculus of bile duct without cholangitis with obstruction    Renal calcification 03/10/2019   Transaminitis 02/04/2019   Abnormal ultrasound of liver 02/04/2019   Sessile rectal polyp 05/02/2018   Internal hemorrhoids without complication 60/10/9321   Diverticulosis 05/02/2018   Leukoplakia of oral mucosa, 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   Chronic pain of both knees 08/19/2017   Cataracts, bilateral 07/08/2015   Hyperlipidemia associated with type 2 diabetes mellitus (Alison [name]) 04/27/2015   History of total knee arthroplasty 04/18/2014   History of lumbar laminectomy for spinal cord decompression 05/04/2013   Synovial cyst of lumbar spine 03/31/2013   Spinal stenosis of lumbar region with neurogenic claudication 03/30/2013   Synovial cyst of lumbar facet joint 03/30/2013   Type 2 diabetes mellitus with neurological complications (Alison [name]) 55/73/2202   Osteoarthritis of right knee 02/01/2013   History of total knee replacement 02/01/2013   Hyperglycemia 10/03/2012   Unspecified vitamin D deficiency    Pernicious anemia    Glaucoma (increased eye pressure)    Benign essential hypertension    Osteopenia      Discharge Diagnoses: GIST tumor Patient Active Problem List   Diagnosis Date Noted   Gastrointestinal stromal tumor (GIST) (Alison [name]) 01/14/2021   Calculus of bile duct without cholangitis with obstruction    Renal calcification 03/10/2019   Transaminitis 02/04/2019   Abnormal ultrasound of liver 02/04/2019   Sessile rectal polyp 05/02/2018   Internal hemorrhoids without complication 54/27/0623   Diverticulosis  05/02/2018   Leukoplakia of oral mucosa, 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   Chronic pain of both knees 08/19/2017   Cataracts, bilateral 07/08/2015   Hyperlipidemia associated with type 2 diabetes mellitus (Alison [name]) 04/27/2015   History of total knee arthroplasty 04/18/2014   History of lumbar laminectomy for spinal cord decompression 05/04/2013   Synovial cyst of lumbar spine 03/31/2013   Spinal stenosis of lumbar region with neurogenic claudication 03/30/2013   Synovial cyst of lumbar facet joint 03/30/2013   Type 2 diabetes mellitus with neurological complications (Alison [name]) 76/28/3151   Osteoarthritis of right knee 02/01/2013   History of total knee replacement 02/01/2013   Hyperglycemia 10/03/2012   Unspecified vitamin D deficiency    Pernicious anemia    Glaucoma (increased eye pressure)    Benign essential hypertension    Osteopenia     Operations: Procedure(s): ROBOTIC POSSIBLY OPEN GASTRIC WEDGE RESECTION  Admission Condition: good  Discharged Condition: good  Indication for Admission: GIST tumor  Hospital Course: Ms. 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  She underwent robotic resection.  She recovered well in the floor and was discharged.  During her postoperative recovery her hemoglobin dropped below 7 so she was transfused unit 2 units and the hemoglobin then was above 10.  This was likely in part due to acute blood loss anemia and also delusional anemia and also chronic blood loss anemia.  Otherwise her recovery was uneventful.  Consults: None  Significant Diagnostic Studies: none  Treatments: surgery: As above  Disposition: Home  Patient Instructions:  Allergies as of 01/17/2021       Reactions   Aspirin Nausea Only   Can take  81 mg but can't take 325mg , hard on stomach.        Medication List     TAKE these medications    acetaminophen 650 MG CR tablet Commonly known as: TYLENOL Take 1,300 mg by  mouth in the morning and at bedtime.   amLODipine 5 MG tablet Commonly known as: NORVASC Take 1 tablet by mouth once daily   aspirin EC 81 MG tablet Take 81 mg by mouth daily.   atorvastatin 40 MG tablet Commonly known as: LIPITOR TAKE 1 TABLET BY MOUTH ONCE DAILY AT  6PM   CALCIUM 600 + D PO Take 1 tablet by mouth in the morning.   carboxymethylcellulose 0.5 % Soln Commonly known as: REFRESH PLUS Place 1 drop into both eyes 3 (three) times daily as needed (dry eyes).   celecoxib 200 MG capsule Commonly known as: CELEBREX Take 200 mg by mouth in the morning.   dorzolamide-timolol 22.3-6.8 MG/ML ophthalmic solution Commonly known as: COSOPT Place 1 drop into both eyes 2 (two) times daily.   gabapentin 300 MG capsule Commonly known as: NEURONTIN Take 1 capsule (300 mg total) by mouth at bedtime.   latanoprost 0.005 % ophthalmic solution Commonly known as: XALATAN Place 1 drop into both eyes at bedtime.   Lidocaine HCl 4 % Crea Apply 1 application topically 2 (two) times daily as needed (knee pain.).   lisinopril 30 MG tablet Commonly known as: ZESTRIL Take 1 tablet (30 mg total) by mouth daily.   metFORMIN 500 MG tablet Commonly known as: GLUCOPHAGE TAKE 1/2 (ONE-HALF) TABLET BY MOUTH IN THE EVENING WITH SUPPER   multivitamin with minerals Tabs tablet Take 1 tablet by mouth daily.   OMEGA 3 PO Take 1,200 mg by mouth in the morning.   oxyCODONE-acetaminophen 5-325 MG tablet Commonly known as: Percocet Take 1 tablet by mouth every 4 hours as needed for severe pain.   pantoprazole 40 MG tablet Commonly known as: PROTONIX Take 1 tablet (40 mg total) by mouth daily.   THERAWORX RELIEF EX Apply 1 application topically daily as needed (pain).   Vitamin D-3 25 MCG (1000 UT) Caps Take 1,000 Units by mouth in the morning.        Activity: no heavy lifting for 4 weeks Diet: regular diet Wound Care: keep wound clean and dry  Follow-up:  With Dr.  Thermon [name] in 4 weeks.  Signed: [facility], Bariatric, & Minimally Invasive Surgery [facility], [place]   01/17/2021, 7:29 PM

## 2021-01-17 NOTE — Plan of Care (Signed)

## 2021-01-17 NOTE — Progress Notes (Addendum)
Patient ambulated approximately 800 ft., tolerated well.  R JP dsg  site C/D/I. Noted Abdomen LLQ slightly distended.    2 units PRBC's transfusion completed without incident. Patient ambulating in room and to BR independently.   Tolerates diet, but stated " I feel full". Day shift RN made aware.

## 2021-01-17 NOTE — Discharge Instructions (Addendum)
POST OPERATIVE INSTRUCTIONS  Thinking Clearly  The anesthesia may cause you to feel different for 1 or 2 days. Do not drive, drink alcohol, or make any big decisions for at least 2 days.  Nutrition When you wake up, you will be able to drink small amounts of liquid. If you do not feel sick, you can slowly advance your diet to regular foods. Continue to drink lots of fluids, usually about 8 to 10 glasses per day. Eat a high-fiber diet so you dont strain during bowel movements. High-Fiber Foods Foods high in fiber include beans, bran cereals and whole-grain breads, peas, dried fruit (figs, apricots, and dates), raspberries, blackberries, strawberries, sweet corn, broccoli, baked potatoes with skin, plums, pears, apples, greens, and nuts. Activity Slowly increase your activity. Be sure to get up and walk every hour or so to prevent blood clots. No heavy lifting or strenuous activity for 4 weeks following surgery to prevent hernias at your incision sites or recurrence of your hernia. It is normal to feel tired. You may need more sleep than usual.  Get your rest but make sure to get up and move around frequently to prevent blood clots and pneumonia.  Work and Return to [employer] can go back to work when you feel well enough. Discuss the timing with your surgeon. You can usually go back to school or work 1 week or less after an laparoscopic or an open repair. If your work requires heavy lifting or strenuous activity you need to be placed on light duty for 4 weeks following surgery. You can return to gym class, sports or other physical activities 4 weeks after surgery.  Wound Care You may experience significant bruising throughout the abdominal wall that may track down into the groin including into the scrotum in males.  Rest, elevating the groin and scrotum above the level of the heart, ice and compression with tight fitting underwear or an abdominal binder can help.  Always wash your hands  before and after touching near your incision site. Do not soak in a bathtub until cleared at your follow up appointment. You may take a shower 24 hours after surgery. A small amount of drainage from the incision is normal. If the drainage is thick and yellow or the site is red, you may have an infection, so call your surgeon. If you have a drain in one of your incisions, it will be taken out in office when the drainage stops. Steri-Strips will fall off in 7 to 10 days or they will be removed during your first office visit. If you have dermabond glue covering over the incision, allow the glue to flake off on its own. Protect the new skin, especially from the sun. The sun can burn and cause darker scarring. Your scar will heal in about 4 to 6 weeks and will become softer and continue to fade over the next year.  The cosmetic appearance of the incisions will improve over the course of the first year after surgery. Sensation around your incision will return in a few weeks or months.  Bowel Movements After intestinal surgery, you may have loose watery stools for several days. If watery diarrhea lasts longer than 3 days, contact your surgeon. Pain medication (narcotics) can cause constipation. Increase the fiber in your diet with high-fiber foods if you are constipated. You can take an over the counter stool softener like Colace to avoid constipation.  Additional over the counter medications can also be used if Colace isn't  sufficient (for example, Milk of Magnesia or Miralax).  Pain The amount of pain is different for each person. Some people need only 1 to 3 doses of pain control medication, while others need more. Take alternating doses of tylenol and ibuprofen around the clock for the first five days following surgery.  This will provide a baseline of pain control and help with inflammation.  Take the narcotic pain medication in addition if needed for severe pain.  Contact Your Surgeon at  [phone], if you have: Pain that will not go away Pain that gets worse A fever of more than 101F (38.3C) Repeated vomiting Swelling, redness, bleeding, or bad-smelling drainage from your wound site Strong abdominal pain No bowel movement or unable to pass gas for 3 days Watery diarrhea lasting longer than 3 days  Pain Control The goal of pain control is to minimize pain, keep you moving and help you heal. Your surgical team will work with you on your pain plan. Most often a combination of therapies and medications are used to control your pain. You may also be given medication (local anesthetic) at the surgical site. This may help control your pain for several days. Extreme pain puts extra stress on your body at a time when your body needs to focus on healing. Do not wait until your pain has reached a level 10 or is unbearable before telling your doctor or nurse. It is much easier to control pain before it becomes severe. Following a laparoscopic procedure, pain is sometimes felt in the shoulder. This is due to the gas inserted into your abdomen during the procedure. Moving and walking helps to decrease the gas and the right shoulder pain.  Use the guide below for ways to manage your post-operative pain. Learn more by going to facs.org/safepaincontrol.  How Intense Is My Pain Common Therapies to Feel Better       I hardly notice my pain, and it does not interfere with my activities.  I notice my pain and it distracts me, but I can still do activities (sitting up, walking, standing).  Non-Medication Therapies  Ice (in a bag, applied over clothing at the surgical site), elevation, rest, meditation, massage, distraction (music, TV, play) walking and mild exercise Splinting the abdomen with pillows +  Non-Opioid Medications Acetaminophen (Tylenol) Non-steroidal anti-inflammatory drugs (NSAIDS) Aspirin, Ibuprofen (Motrin, Advil) Naproxen (Aleve) Take these as needed, when  you feel pain. Both acetaminophen and NSAIDs help to decrease pain and swelling (inflammation).      My pain is hard to ignore and is more noticeable even when I rest.  My pain interferes with my usual activities.  Non-Medication Therapies  +  Non-Opioid medications  Take on a regular schedule (around-the-clock) instead of as needed. (For example, Tylenol every 6 hours at 9:00 am, 3:00 pm, 9:00 pm, 3:00 am and Motrin every 6 hours at 12:00 am, 6:00 am, 12:00 pm, 6:00 pm)         I am focused on my pain, and I am not doing my daily activities.  I am groaning in pain, and I cannot sleep. I am unable to do anything.  My pain is as bad as it could be, and nothing else matters.  Non-Medication Therapies  +  Around-the-Clock Non-Opioid Medications  +  Short-acting opioids  Opioids should be used with other medications to manage severe pain. Opioids block pain and give a feeling of euphoria (feel high). Addiction, a serious side effect of opioids, is rare with short-term (  a few days) use.  Examples of short-acting opioids include: Tramadol (Ultram), Hydrocodone (Norco, Vicodin), Hydromorphone (Dilaudid), Oxycodone (Oxycontin)     The above directions have been adapted from the [employer] Surgical Patient Education Program.  Please refer to the ACS website if needed: [email or web address]   Alison Raw, MD [facility], [place], [place], [place], [place]  [address] ?  [address], [place], [place]   [address] [phone] ? [phone] ? FAX [phone] Web site: [email or web address]

## 2021-01-17 NOTE — Evaluation (Signed)
Physical Therapy Evaluation Patient Details Name: Alison [name] MRN: [id number] DOB: 1945/10/19 Today's Date: 01/17/2021  History of Present Illness  Patinent is an 76 y.o. 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 on 01/14/21. PMH significant for anxiety, HTN, DM, GERD, HLD, obesity, OA, osteopenia, bil TKA.    Clinical Impression  Alison [name] is 76 y.o. female admitted with above HPI and diagnosis. Patient is currently limited by functional impairments below (see PT problem list). Patient lives alone and is independent at baseline. Patient evaluated by Physical Therapy with no further acute PT needs identified. All education has been completed and the patient has no further questions. Pt is mod independent for transfer and gait with SPC, encouraged to walk with RN/NT staff and family during stay. See below for any follow-up Physical Therapy or equipment needs. PT is signing off. Thank you for this referral.        Recommendations for follow up therapy are one component of a multi-disciplinary discharge planning process, led by the attending physician.  Recommendations may be updated based on patient status, additional functional criteria and insurance authorization.  Follow Up Recommendations No PT follow up    Assistance Recommended at Discharge None  Patient can return home with the following       Equipment Recommendations None recommended by PT  Recommendations for Other Services       Functional Status Assessment Patient has had a recent decline in their functional status and demonstrates the ability to make significant improvements in function in a reasonable and predictable amount of time.     Precautions / Restrictions Precautions Precautions: Fall Restrictions Weight Bearing Restrictions: No      Mobility  Bed Mobility Overal bed mobility: Modified Independent                  Transfers Overall transfer level: Modified independent                  General transfer comment: extra time    Ambulation/Gait Ambulation/Gait assistance: Supervision;Modified independent (Device/Increase time) Gait Distance (Feet): 600 Feet Assistive device: Straight cane Gait Pattern/deviations: WFL(Within Functional Limits) Gait velocity: fair     General Gait Details: pt steady and demonstrates safe use of SPC, no LOB noted.  Stairs Stairs: Yes Stairs assistance: Supervision Stair Management: Two rails;Step to pattern;Alternating pattern;Forwards Number of Stairs: 5 General stair comments: no LOB noted, no cues needed for sequence and pt ascended with alternating step and decended with stp to pattern.  Wheelchair Mobility    Modified Rankin (Stroke Patients Only)       Balance Overall balance assessment: Mild deficits observed, not formally tested                                           Pertinent Vitals/Pain Pain Assessment: No/denies pain    Home Living Family/patient expects to be discharged to:: Private residence Living Arrangements: Other relatives;Children Available Help at Discharge: Family Type of Home: House Home Access: Stairs to enter Entrance Stairs-Rails: Can reach both Entrance Stairs-Number of Steps: 5   Home Layout: One level Home Equipment: Cane - single point;Shower seat      Prior Function Prior Level of Function : Independent/Modified Independent                     Hand Dominance   Dominant  Hand: Right    Extremity/Trunk Assessment   Upper Extremity Assessment Upper Extremity Assessment: Overall WFL for tasks assessed    Lower Extremity Assessment Lower Extremity Assessment: Overall WFL for tasks assessed    Cervical / Trunk Assessment Cervical / Trunk Assessment: Normal  Communication   Communication: No difficulties  Cognition Arousal/Alertness: Awake/alert Behavior During Therapy: WFL for tasks assessed/performed Overall Cognitive Status: Within  Functional Limits for tasks assessed                                          General Comments      Exercises     Assessment/Plan    PT Assessment Patient does not need any further PT services  PT Problem List Decreased strength;Decreased balance;Decreased range of motion;Decreased activity tolerance;Decreased mobility;Decreased knowledge of use of DME;Decreased knowledge of precautions;Obesity       PT Treatment Interventions DME instruction;Gait training;Stair training;Functional mobility training;Therapeutic activities;Therapeutic exercise;Balance training;Patient/family education    PT Goals (Current goals can be found in the Care Plan section)  Acute Rehab PT Goals Patient Stated Goal: get home PT Goal Formulation: With patient Time For Goal Achievement: 01/17/21 Potential to Achieve Goals: Good    Frequency  (1x eval)     Co-evaluation               AM-PAC PT "6 Clicks" Mobility  Outcome Measure Help needed turning from your back to your side while in a flat bed without using bedrails?: None Help needed moving from lying on your back to sitting on the side of a flat bed without using bedrails?: None Help needed moving to and from a bed to a chair (including a wheelchair)?: None Help needed standing up from a chair using your arms (e.g., wheelchair or bedside chair)?: None Help needed to walk in hospital room?: A Little Help needed climbing 3-5 steps with a railing? : A Little 6 Click Score: 22    End of Session Equipment Utilized During Treatment: Gait belt Activity Tolerance: Patient tolerated treatment well Patient left: in chair;with call bell/phone within reach;with chair alarm set Nurse Communication: Mobility status PT Visit Diagnosis: Muscle weakness (generalized) (M62.81);Difficulty in walking, not elsewhere classified (R26.2)    Time: 2956-2130 PT Time Calculation (min) (ACUTE ONLY): 20 min   Charges:   PT Evaluation $PT  Eval Low Complexity: 1 Low          Verner Mould, DPT Acute Rehabilitation Services Office [phone] Pager [phone]   Jacques Navy 01/17/2021, 11:52 AM

## 2021-01-20 ENCOUNTER — Telehealth: Payer: Self-pay | Admitting: *Deleted

## 2021-01-20 NOTE — Telephone Encounter (Signed)
Transition Care Management Unsuccessful Follow-up Telephone Call  Date of discharge and from where:  01/17/2021 [place]  Attempts:  1st Attempt  Reason for unsuccessful TCM follow-up call:  Left voice message to return call.

## 2021-01-21 NOTE — Telephone Encounter (Signed)
Transition Care Management Follow-up Telephone Call Date of discharge and from where: 01/17/2021 [place] How have you been since you were released from the hospital? Still weak but getting better Any questions or concerns? No  Items Reviewed: Did the pt receive and understand the discharge instructions provided? Yes  Medications obtained and verified? Yes  Other? No  Any new allergies since your discharge? No  Dietary orders reviewed? Yes Do you have support at home? Yes   Home Care and Equipment/Supplies: Were home health services ordered? no If so, what is the name of the agency? na  Has the agency set up a time to come to the patient's home? not applicable Were any new equipment or medical supplies ordered?  No What is the name of the medical supply agency? na Were you able to get the supplies/equipment? not applicable Do you have any questions related to the use of the equipment or supplies? No  Functional Questionnaire: (I = Independent and D = Dependent) ADLs: I  Bathing/Dressing- I  Meal Prep- I  Eating- I  Maintaining continence- I  Transferring/Ambulation- I with assistance cane  Managing Meds- I  Follow up appointments reviewed:  PCP Hospital f/u appt confirmed? Yes  Scheduled to see Dinah on 01/22/2021 @ [facility]. [facility] f/u appt confirmed? Yes  Scheduled to see Surgeon on 01/29/2021  Are transportation arrangements needed? No  If their condition worsens, is the pt aware to call PCP or go to the Emergency Dept.? Yes Was the patient provided with contact information for the PCP's office or ED? Yes Was to pt encouraged to call back with questions or concerns? Yes

## 2021-01-22 ENCOUNTER — Encounter: Payer: Self-pay | Admitting: Family

## 2021-01-22 ENCOUNTER — Other Ambulatory Visit: Payer: Self-pay

## 2021-01-22 ENCOUNTER — Ambulatory Visit (INDEPENDENT_AMBULATORY_CARE_PROVIDER_SITE_OTHER): Payer: Medicare Other | Admitting: Family

## 2021-01-22 VITALS — BP 120/72 | HR 81 | Temp 97.3°F | Ht 65.0 in | Wt 178.6 lb

## 2021-01-22 DIAGNOSIS — K219 Gastro-esophageal reflux disease without esophagitis: Secondary | ICD-10-CM | POA: Diagnosis not present

## 2021-01-22 DIAGNOSIS — I10 Essential (primary) hypertension: Secondary | ICD-10-CM | POA: Diagnosis not present

## 2021-01-22 DIAGNOSIS — D62 Acute posthemorrhagic anemia: Secondary | ICD-10-CM | POA: Diagnosis not present

## 2021-01-22 DIAGNOSIS — R238 Other skin changes: Secondary | ICD-10-CM

## 2021-01-22 DIAGNOSIS — C49A0 Gastrointestinal stromal tumor, unspecified site: Secondary | ICD-10-CM

## 2021-01-22 LAB — CBC WITH DIFFERENTIAL/PLATELET
Absolute Monocytes: 1173 cells/uL — ABNORMAL HIGH (ref 200–950)
Basophils Absolute: 69 cells/uL (ref 0–200)
Basophils Relative: 0.5 %
Eosinophils Absolute: 731 cells/uL — ABNORMAL HIGH (ref 15–500)
Eosinophils Relative: 5.3 %
HCT: 36.4 % (ref 35.0–45.0)
Hemoglobin: 11.5 g/dL — ABNORMAL LOW (ref 11.7–15.5)
Lymphs Abs: 2429 cells/uL (ref 850–3900)
MCH: 27.5 pg (ref 27.0–33.0)
MCHC: 31.6 g/dL — ABNORMAL LOW (ref 32.0–36.0)
MCV: 87.1 fL (ref 80.0–100.0)
MPV: 9.1 fL (ref 7.5–12.5)
Monocytes Relative: 8.5 %
Neutro Abs: 9398 cells/uL — ABNORMAL HIGH (ref 1500–7800)
Neutrophils Relative %: 68.1 %
Platelets: 667 10*3/uL — ABNORMAL HIGH (ref 140–400)
RBC: 4.18 10*6/uL (ref 3.80–5.10)
RDW: 13.3 % (ref 11.0–15.0)
Total Lymphocyte: 17.6 %
WBC: 13.8 10*3/uL — ABNORMAL HIGH (ref 3.8–10.8)

## 2021-01-22 LAB — BASIC METABOLIC PANEL WITH GFR
BUN: 12 mg/dL (ref 7–25)
CO2: 28 mmol/L (ref 20–32)
Calcium: 9.3 mg/dL (ref 8.6–10.4)
Chloride: 102 mmol/L (ref 98–110)
Creat: 0.99 mg/dL (ref 0.60–1.00)
Glucose, Bld: 103 mg/dL — ABNORMAL HIGH (ref 65–99)
Potassium: 4.3 mmol/L (ref 3.5–5.3)
Sodium: 136 mmol/L (ref 135–146)
eGFR: 59 mL/min/{1.73_m2} — ABNORMAL LOW (ref >=60)

## 2021-01-22 MED ORDER — DESITIN 40 % EX PSTE: 1.0000 "application " | PASTE | Freq: Two times a day (BID) | CUTANEOUS | 0 refills | Status: AC

## 2021-01-22 NOTE — Progress Notes (Signed)
Provider: Marlowe Sax FNP-C   Yariel Ferraris, Nelda Bucks, NP  Patient Care Team: Cecilie Heidel, Nelda Bucks, NP as PCP - General (Family Medicine) Shirley Muscat Loreen Freud, MD as Referring Physician (Optometry) Latanya Maudlin, MD as Consulting Physician (Orthopedic Surgery) Warden Fillers, MD as Consulting Physician (Ophthalmology) Ladene Artist, MD as Consulting Physician (Gastroenterology) Abbie Sons, MD (Urology)  Extended Emergency Contact Information Primary Emergency Contact: Krotz,[name] Address: [address]          [place], [place] [address] [name] of [place] Phone: [phone] Mobile Phone: [phone] Relation: Daughter Secondary Emergency Contact: [facility] Address: [address]          [place], [place] [address] [name] of [place] Phone: [phone] Mobile Phone: [phone] Relation: Sister  Code Status:  Full Code  Goals of care: Advanced Directive information Advanced Directives 01/14/2021  Does Patient Have a Medical Advance Directive? No  Type of Advance Directive -  Does patient want to make changes to medical advance directive? -  Would patient like information on creating a medical advance directive? No - Patient declined  Pre-existing out of facility DNR order (yellow form or pink MOST form) -     Chief Complaint  Patient presents with   Transitions Of Care    TOC. Hospital Discharged 01/17/2021 from [facility]    HPI:  Pt is a 76 y.o. 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  Has acid reflux.unable to eat large meals post surgery.Has JP drain which she states has been draining. No leakage around the tube.   Has been on liquid diet but introducing solid slowly.Bowels have been loose. Has follow up  appointment with General surgery 01/29/2021 Dr.Stechschulte  She denies any fever or chills.  Past Medical History:  Diagnosis Date   Anxiety    Benign essential hypertension    Blood transfusion without reported diagnosis    Cataract    removed bilateraly   Diabetes mellitus without complication ([place])    Diverticulitis of colon (without mention of hemorrhage)(562.11)    GERD (gastroesophageal reflux disease)    Goiter, specified as simple    Helicobacter pylori gastritis    Hyperlipidemia LDL goal < 100    Intrahepatic bile duct dilation    Numbness    TOES RT FOOT and toes left foot   Obesity, unspecified    Osteoarthrosis, unspecified whether generalized or localized, lower leg    Osteopenia    Other specified disease of nail    FUNGUS OF FINGERNAILS   Pernicious anemia    Unspecified glaucoma(365.9)    Unspecified vitamin D deficiency    Wears dentures    Wears glasses    Past Surgical History:  Procedure Laterality Date   BACK SURGERY     CATARACT EXTRACTION     right eye 06/20/15 left eye 09/12/15   COLECTOMY  2009   COLONOSCOPY  09/06/2007   Dr Carol Ada   DILATION AND CURETTAGE OF UTERUS     ENDOSCOPIC RETROGRADE CHOLANGIOPANCREATOGRAPHY (ERCP) WITH PROPOFOL N/A 03/17/2019   Procedure: ENDOSCOPIC RETROGRADE CHOLANGIOPANCREATOGRAPHY (ERCP) WITH PROPOFOL;  Surgeon: Ladene Artist, MD;  Location: [facility] ENDOSCOPY;  Service: Endoscopy;  Laterality: N/A;   JOINT REPLACEMENT     right   LUMBAR LAMINECTOMY/DECOMPRESSION MICRODISCECTOMY N/A 03/30/2013   Procedure: CENTRAL  DECOMPRESSION L4 - L5 AND EXCISION 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;  Surgeon: Tobi Bastos, MD;  Location: [facility] ORS;  Service: Orthopedics;  Laterality: N/A;   MULTIPLE TOOTH EXTRACTIONS     REMOVAL OF STONES  03/17/2019   Procedure: REMOVAL OF STONES;  Surgeon: Ladene Artist, MD;  Location: [facility];  Service: Endoscopy;;   SIGMOIDOSCOPY  02/16/2018   Dr Nira Conn  03/17/2019   Procedure:  SPHINCTEROTOMY;  Surgeon: Ladene Artist, MD;  Location: [facility];  Service: Endoscopy;;   TOTAL KNEE ARTHROPLASTY Right 02/01/2013   Procedure: RIGHT TOTAL KNEE ARTHROPLASTY;  Surgeon: Tobi Bastos, MD;  Location: [facility];  Service: Orthopedics;  Laterality: Right;   TOTAL KNEE ARTHROPLASTY Left 04/18/2014   Procedure: LEFT TOTAL KNEE ARTHROPLASTY;  Surgeon: Latanya Maudlin, MD;  Location: [facility];  Service: Orthopedics;  Laterality: Left;    Allergies  Allergen Reactions   Aspirin Nausea Only and Nausea And Vomiting    Can take 81 mg but can't take $RemoveBe'325mg'JWDjHfvTn$ , hard on stomach. Can take 81 mg but can't take $RemoveBe'325mg'AcbNtIHwH$ , hard on stomach.    Allergies as of 01/22/2021       Reactions   Aspirin Nausea Only, Nausea And Vomiting   Can take 81 mg but can't take $RemoveBe'325mg'fIbhUgqwm$ , hard on stomach. Can take 81 mg but can't take $RemoveBe'325mg'pbonlAang$ , hard on stomach.        Medication List        Accurate as of January 22, 2021  1:27 PM. If you have any questions, ask your nurse or doctor.          acetaminophen 650 MG CR tablet Commonly known as: TYLENOL Take 1,300 mg by mouth in the morning and at bedtime.   amLODipine 5 MG tablet Commonly known as: NORVASC Take 1 tablet by mouth once daily   aspirin EC 81 MG tablet Take 81 mg by mouth daily.   atorvastatin 40 MG tablet Commonly known as: LIPITOR TAKE 1 TABLET BY MOUTH ONCE DAILY AT  6PM   CALCIUM 600 + D PO Take 1 tablet by mouth in the morning.   carboxymethylcellulose 0.5 % Soln Commonly known as: REFRESH PLUS Place 1 drop into both eyes 3 (three) times daily as needed (dry eyes).   celecoxib 200 MG capsule Commonly known as: CELEBREX Take 200 mg by mouth in the morning.   dorzolamide-timolol 22.3-6.8 MG/ML ophthalmic solution Commonly known as: COSOPT Place 1 drop into both eyes 2 (two) times daily.   gabapentin 300 MG capsule Commonly known as: NEURONTIN Take 1 capsule (300 mg total) by mouth at bedtime.   latanoprost 0.005 %  ophthalmic solution Commonly known as: XALATAN Place 1 drop into both eyes at bedtime.   Lidocaine HCl 4 % Crea Apply 1 application topically 2 (two) times daily as needed (knee pain.).   lisinopril 30 MG tablet Commonly known as: ZESTRIL Take 1 tablet (30 mg total) by mouth daily.   metFORMIN 500 MG tablet Commonly known as: GLUCOPHAGE TAKE 1/2 (ONE-HALF) TABLET BY MOUTH IN THE EVENING WITH SUPPER   multivitamin with minerals Tabs tablet Take 1 tablet by mouth daily.   OMEGA 3 PO Take 1,200 mg by mouth in the morning.   oxyCODONE-acetaminophen 5-325 MG tablet Commonly known as: Percocet Take 1 tablet by mouth every 4 hours as needed for severe pain.   pantoprazole 40 MG tablet Commonly known as: PROTONIX Take 1 tablet (40 mg total) by mouth daily.  THERAWORX RELIEF EX Apply 1 application topically daily as needed (pain).   Vitamin D-3 25 MCG (1000 UT) Caps Take 1,000 Units by mouth in the morning.        Review of Systems  Constitutional:  Negative for appetite change, chills, fatigue, fever and unexpected weight change.  HENT:  Negative for congestion, dental problem, ear discharge, ear pain, facial swelling, hearing loss, nosebleeds, postnasal drip, rhinorrhea, sinus pressure, sinus pain, sneezing, sore throat, tinnitus and trouble swallowing.   Eyes:  Negative for pain, discharge, redness, itching and visual disturbance.  Respiratory:  Negative for cough, chest tightness, shortness of breath and wheezing.   Cardiovascular:  Negative for chest pain, palpitations and leg swelling.  Gastrointestinal:  Negative for abdominal distention, abdominal pain, blood in stool, constipation, diarrhea, nausea and vomiting.       Acid reflux   Endocrine: Negative for cold intolerance, heat intolerance, polydipsia, polyphagia and polyuria.  Genitourinary:  Negative for difficulty urinating, dysuria, flank pain, frequency and urgency.  Musculoskeletal:  Positive for gait problem.  Negative for arthralgias, back pain, joint swelling, myalgias, neck pain and neck stiffness.  Skin:  Negative for color change, pallor and rash.       Vulva itching and skin irritation  Mid-abd surgical incision and JP drain   Neurological:  Negative for dizziness, syncope, speech difficulty, weakness, light-headedness, numbness and headaches.  Hematological:  Does not bruise/bleed easily.  Psychiatric/Behavioral:  Negative for agitation, behavioral problems, confusion, hallucinations, self-injury, sleep disturbance and suicidal ideas. The patient is not nervous/anxious.    Immunization History  Administered Date(s) Administered   Fluad Quad(high Dose 65+) 09/05/2018, 09/26/2020   Influenza, High Dose Seasonal PF 10/02/2017, 11/20/2019   Influenza,inj,Quad PF,6+ Mos 10/03/2012, 09/21/2014, 11/07/2015   Influenza,inj,quad, With Preservative 11/12/2016   Influenza-Unspecified 10/28/2013   PFIZER(Purple Top)SARS-COV-2 Vaccination 03/25/2019, 03/25/2019, 04/18/2019, 11/01/2019, 05/23/2020, 10/07/2020   Pneumococcal Conjugate-13 01/04/2014, 01/04/2014, 11/04/2016   Pneumococcal Polysaccharide-23 12/29/2012, 11/19/2017   Zoster Recombinat (Shingrix) 11/25/2016, 01/26/2017   Pertinent  Health Maintenance Due  Topic Date Due   FOOT EXAM  01/10/2021   HEMOGLOBIN A1C  07/04/2021   OPHTHALMOLOGY EXAM  09/23/2021   COLONOSCOPY (Pts 45-49yrs Insurance coverage will need to be confirmed)  02/17/2023   INFLUENZA VACCINE  Completed   DEXA SCAN  Completed   Fall Risk 01/15/2021 01/15/2021 01/16/2021 01/17/2021 01/17/2021  Falls in the past year? - - - - -  Was there an injury with Fall? - - - - -  Fall Risk Category Calculator - - - - -  Fall Risk Category - - - - -  Patient Fall Risk Level Moderate fall risk Moderate fall risk Moderate fall risk Moderate fall risk Moderate fall risk  Patient at Risk for Falls Due to - - - - -  Fall risk Follow up - - - - -   Functional Status Survey:    Vitals:    01/22/21 1308  BP: 120/72  Pulse: 81  Temp: (!) 97.3 F (36.3 C)  TempSrc: Skin  SpO2: 98%  Weight: 178 lb 9.6 oz (81 kg)  Height: $Remove'5\' 5"'fcrkKgF$  (1.651 m)   Body mass index is 29.72 kg/m. Physical Exam Vitals reviewed. Exam conducted with a chaperone present Carroll Kinds).  Constitutional:      General: She is not in acute distress.    Appearance: Normal appearance. She is normal weight. She is not ill-appearing or diaphoretic.  HENT:     Head: Normocephalic.     Right Ear: Tympanic  membrane, ear canal and external ear normal. There is no impacted cerumen.     Left Ear: Tympanic membrane, ear canal and external ear normal. There is no impacted cerumen.     Nose: Nose normal. No congestion or rhinorrhea.     Mouth/Throat:     Mouth: Mucous membranes are moist.     Pharynx: Oropharynx is clear. No oropharyngeal exudate or posterior oropharyngeal erythema.  Eyes:     General: No scleral icterus.       Right eye: No discharge.        Left eye: No discharge.     Extraocular Movements: Extraocular movements intact.     Conjunctiva/sclera: Conjunctivae normal.     Pupils: Pupils are equal, round, and reactive to light.  Neck:     Vascular: No carotid bruit.  Cardiovascular:     Rate and Rhythm: Normal rate and regular rhythm.     Pulses: Normal pulses.     Heart sounds: Normal heart sounds. No murmur heard.   No friction rub. No gallop.  Pulmonary:     Effort: Pulmonary effort is normal. No respiratory distress.     Breath sounds: Normal breath sounds. No wheezing, rhonchi or rales.  Chest:     Chest wall: No tenderness.  Abdominal:     General: Bowel sounds are normal. There is no distension.     Palpations: Abdomen is soft. There is no mass.     Tenderness: There is no abdominal tenderness. There is no right CVA tenderness, left CVA tenderness, guarding or rebound.     Comments: Right upper Quadrant JP drain draining pink colored drainage.   Genitourinary:    Exam position:  Lithotomy position.     Labia:        Right: No rash, tenderness, lesion or injury.        Left: No rash, tenderness, lesion or injury.      Comments: Bilateral labia skin irritation suspect due to incontinent pad.has irritates skin also partly from scratching.  Musculoskeletal:        General: No swelling or tenderness. Normal range of motion.     Cervical back: Normal range of motion. No rigidity or tenderness.     Right lower leg: No edema.     Left lower leg: No edema.  Lymphadenopathy:     Cervical: No cervical adenopathy.  Skin:    General: Skin is warm and dry.     Coloration: Skin is not pale.     Findings: No bruising, erythema, lesion or rash.     Comments: Mid- abdomen surgical incision intact.no erythema or drainage noted.  Neurological:     Mental Status: She is alert and oriented to person, place, and time.     Cranial Nerves: No cranial nerve deficit.     Sensory: No sensory deficit.     Motor: No weakness.     Coordination: Coordination normal.     Gait: Gait normal.  Psychiatric:        Mood and Affect: Mood normal.        Speech: Speech normal.        Behavior: Behavior normal.        Thought Content: Thought content normal.        Judgment: Judgment normal.    Labs reviewed: Recent Labs    01/15/21 0408 01/16/21 0458 01/17/21 0444  NA 134* 134* 134*  K 4.2 4.3 4.0  CL 104 102 103  CO2 22 25  20*  GLUCOSE 164* 104* 107*  BUN $Re'12 12 11  'nNq$ CREATININE 0.78 0.85 0.75  CALCIUM 8.8* 8.8* 8.9   Recent Labs    09/23/20 0850 11/14/20 2054 11/18/20 1231  AST $Re'16 23 19  'Pws$ ALT $R'13 15 14  'Eo$ ALKPHOS  --  57  --   BILITOT 0.6 0.7 0.6  PROT 7.5 8.1 7.4  ALBUMIN  --  4.2  --    Recent Labs    05/13/20 1111 09/23/20 0850 11/14/20 2054 11/18/20 1231 01/03/21 0853 01/15/21 0408 01/16/21 0458 01/17/21 0444  WBC 8.0 9.1   < > 10.4   < > 15.5* 14.6* 13.1*  NEUTROABS 4,984 5,997  --  7,238  --   --   --   --   HGB 12.2 11.3*   < > 9.3*   < > 7.8* 6.8* 10.4*   HCT 37.7 35.3   < > 28.5*   < > 25.6* 21.0* 31.2*  MCV 91.1 93.6   < > 91.1   < > 88.3 84.0 83.4  PLT 322 360   < > 428*   < > 536* 487* 487*   < > = values in this interval not displayed.   Lab Results  Component Value Date   TSH 0.48 09/04/2019   Lab Results  Component Value Date   HGBA1C 5.5 01/03/2021   Lab Results  Component Value Date   CHOL 122 09/23/2020   HDL 38 (L) 09/23/2020   LDLCALC 64 09/23/2020   TRIG 113 09/23/2020   CHOLHDL 3.2 09/23/2020    Significant Diagnostic Results in last 30 days:  No results found.  Assessment/Plan 1. Anemia due to acute blood loss Hgb 6.8 had 2 PRBC units Hgb improved to 10.4  - CBC with Differential/Platelet  2. Benign essential hypertension B/p well controlled  - continue on Amlodipine and Lisinopril  - BMP with eGFR([facility])  3. Gastroesophageal reflux disease without esophagitis Continue on Protonix   4. Skin irritation Bilateral labia skin irritation suspect due to incontinent pad.has irritates skin also partly from scratching.  - Zinc Oxide (DESITIN) 40 % PSTE; Apply 1 application topically 2 (two) times daily.  Dispense: 60 g; Refill: 0  5. 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 - JP drain patent  - Follow up with Dr. Dr.Stechschulte  Family/ staff Communication: Reviewed plan of care with patient and daughter verbalized understanding  Labs/tests ordered:  - CBC with Differential/Platelet - BMP with eGFR([facility])  Next Appointment : As needed if symptoms worsen or fail to improve    Sandrea Hughs, NP

## 2021-01-22 NOTE — Patient Instructions (Signed)
-   apply Desitin cream to affected areas on vulva area twice daily and as needed.Notify provider if symptoms worsen or fail to improve

## 2021-01-28 DIAGNOSIS — E119 Type 2 diabetes mellitus without complications: Secondary | ICD-10-CM | POA: Diagnosis not present

## 2021-01-28 DIAGNOSIS — H35033 Hypertensive retinopathy, bilateral: Secondary | ICD-10-CM | POA: Diagnosis not present

## 2021-01-28 DIAGNOSIS — H401131 Primary open-angle glaucoma, bilateral, mild stage: Secondary | ICD-10-CM | POA: Diagnosis not present

## 2021-01-28 DIAGNOSIS — H26493 Other secondary cataract, bilateral: Secondary | ICD-10-CM | POA: Diagnosis not present

## 2021-01-28 DIAGNOSIS — H43813 Vitreous degeneration, bilateral: Secondary | ICD-10-CM | POA: Diagnosis not present

## 2021-01-28 DIAGNOSIS — Z961 Presence of intraocular lens: Secondary | ICD-10-CM | POA: Diagnosis not present

## 2021-01-29 ENCOUNTER — Other Ambulatory Visit: Payer: Self-pay | Admitting: Family

## 2021-01-29 DIAGNOSIS — E1149 Type 2 diabetes mellitus with other diabetic neurological complication: Secondary | ICD-10-CM

## 2021-01-30 ENCOUNTER — Other Ambulatory Visit: Payer: Self-pay

## 2021-01-30 DIAGNOSIS — D62 Acute posthemorrhagic anemia: Secondary | ICD-10-CM

## 2021-01-30 DIAGNOSIS — R300 Dysuria: Secondary | ICD-10-CM

## 2021-01-30 DIAGNOSIS — D729 Disorder of white blood cells, unspecified: Secondary | ICD-10-CM

## 2021-02-03 ENCOUNTER — Other Ambulatory Visit: Payer: Medicare Other

## 2021-02-03 ENCOUNTER — Other Ambulatory Visit: Payer: Self-pay

## 2021-02-03 DIAGNOSIS — D729 Disorder of white blood cells, unspecified: Secondary | ICD-10-CM | POA: Diagnosis not present

## 2021-02-03 DIAGNOSIS — R300 Dysuria: Secondary | ICD-10-CM

## 2021-02-03 DIAGNOSIS — D62 Acute posthemorrhagic anemia: Secondary | ICD-10-CM

## 2021-02-03 LAB — CBC WITH DIFFERENTIAL/PLATELET
Absolute Monocytes: 915 cells/uL (ref 200–950)
Basophils Absolute: 72 cells/uL (ref 0–200)
Basophils Relative: 0.5 %
Eosinophils Absolute: 415 cells/uL (ref 15–500)
Eosinophils Relative: 2.9 %
HCT: 32 % — ABNORMAL LOW (ref 35.0–45.0)
Hemoglobin: 10 g/dL — ABNORMAL LOW (ref 11.7–15.5)
Lymphs Abs: 2646 cells/uL (ref 850–3900)
MCH: 26.8 pg — ABNORMAL LOW (ref 27.0–33.0)
MCHC: 31.3 g/dL — ABNORMAL LOW (ref 32.0–36.0)
MCV: 85.8 fL (ref 80.0–100.0)
MPV: 9.2 fL (ref 7.5–12.5)
Monocytes Relative: 6.4 %
Neutro Abs: 10253 cells/uL — ABNORMAL HIGH (ref 1500–7800)
Neutrophils Relative %: 71.7 %
Platelets: 717 10*3/uL — ABNORMAL HIGH (ref 140–400)
RBC: 3.73 10*6/uL — ABNORMAL LOW (ref 3.80–5.10)
RDW: 13.7 % (ref 11.0–15.0)
Total Lymphocyte: 18.5 %
WBC: 14.3 10*3/uL — ABNORMAL HIGH (ref 3.8–10.8)

## 2021-02-04 ENCOUNTER — Telehealth: Payer: Self-pay | Admitting: Nurse Practitioner

## 2021-02-04 ENCOUNTER — Other Ambulatory Visit: Payer: Self-pay

## 2021-02-04 DIAGNOSIS — R7989 Other specified abnormal findings of blood chemistry: Secondary | ICD-10-CM

## 2021-02-04 LAB — URINALYSIS, ROUTINE W REFLEX MICROSCOPIC
Bilirubin Urine: NEGATIVE
Glucose, UA: NEGATIVE
Hgb urine dipstick: NEGATIVE
Ketones, ur: NEGATIVE
Leukocytes,Ua: NEGATIVE
Nitrite: NEGATIVE
Protein, ur: NEGATIVE
Specific Gravity, Urine: 1.017 (ref 1.001–1.035)
pH: 5.5 (ref 5.0–8.0)

## 2021-02-04 LAB — URINE CULTURE
MICRO NUMBER:: 12906431
SPECIMEN QUALITY:: ADEQUATE

## 2021-02-04 NOTE — Telephone Encounter (Signed)
Scheduled appt per 1/23 referral. Spoke to pt who is aware of appt date and time. Pt is aware to arrive 15 mins prior to appt time.

## 2021-02-10 ENCOUNTER — Encounter: Payer: Self-pay | Admitting: Nurse Practitioner

## 2021-02-10 ENCOUNTER — Ambulatory Visit: Payer: Medicare Other | Admitting: Hematology

## 2021-02-10 ENCOUNTER — Inpatient Hospital Stay: Payer: Medicare Other

## 2021-02-10 ENCOUNTER — Other Ambulatory Visit: Payer: Self-pay

## 2021-02-10 ENCOUNTER — Inpatient Hospital Stay: Payer: Medicare Other | Attending: Hematology | Admitting: Nurse Practitioner

## 2021-02-10 VITALS — BP 130/83 | HR 88 | Temp 98.6°F | Resp 18 | Wt 179.5 lb

## 2021-02-10 DIAGNOSIS — M1990 Unspecified osteoarthritis, unspecified site: Secondary | ICD-10-CM | POA: Diagnosis not present

## 2021-02-10 DIAGNOSIS — R1012 Left upper quadrant pain: Secondary | ICD-10-CM | POA: Diagnosis not present

## 2021-02-10 DIAGNOSIS — E785 Hyperlipidemia, unspecified: Secondary | ICD-10-CM | POA: Diagnosis not present

## 2021-02-10 DIAGNOSIS — D72829 Elevated white blood cell count, unspecified: Secondary | ICD-10-CM | POA: Insufficient documentation

## 2021-02-10 DIAGNOSIS — D75839 Thrombocytosis, unspecified: Secondary | ICD-10-CM | POA: Diagnosis not present

## 2021-02-10 DIAGNOSIS — I10 Essential (primary) hypertension: Secondary | ICD-10-CM | POA: Insufficient documentation

## 2021-02-10 DIAGNOSIS — Z9049 Acquired absence of other specified parts of digestive tract: Secondary | ICD-10-CM | POA: Insufficient documentation

## 2021-02-10 DIAGNOSIS — R112 Nausea with vomiting, unspecified: Secondary | ICD-10-CM | POA: Insufficient documentation

## 2021-02-10 DIAGNOSIS — C49A0 Gastrointestinal stromal tumor, unspecified site: Secondary | ICD-10-CM

## 2021-02-10 DIAGNOSIS — E119 Type 2 diabetes mellitus without complications: Secondary | ICD-10-CM | POA: Diagnosis not present

## 2021-02-10 DIAGNOSIS — D649 Anemia, unspecified: Secondary | ICD-10-CM | POA: Insufficient documentation

## 2021-02-10 DIAGNOSIS — Z87891 Personal history of nicotine dependence: Secondary | ICD-10-CM | POA: Diagnosis not present

## 2021-02-10 DIAGNOSIS — K219 Gastro-esophageal reflux disease without esophagitis: Secondary | ICD-10-CM | POA: Insufficient documentation

## 2021-02-10 DIAGNOSIS — C49A2 Gastrointestinal stromal tumor of stomach: Secondary | ICD-10-CM | POA: Insufficient documentation

## 2021-02-10 DIAGNOSIS — Z803 Family history of malignant neoplasm of breast: Secondary | ICD-10-CM | POA: Diagnosis not present

## 2021-02-10 LAB — CBC WITH DIFFERENTIAL (CANCER CENTER ONLY)
Abs Immature Granulocytes: 0.05 10*3/uL (ref 0.00–0.07)
Basophils Absolute: 0.1 10*3/uL (ref 0.0–0.1)
Basophils Relative: 0 %
Eosinophils Absolute: 0.3 10*3/uL (ref 0.0–0.5)
Eosinophils Relative: 2 %
HCT: 29.9 % — ABNORMAL LOW (ref 36.0–46.0)
Hemoglobin: 9.4 g/dL — ABNORMAL LOW (ref 12.0–15.0)
Immature Granulocytes: 0 %
Lymphocytes Relative: 16 %
Lymphs Abs: 2.3 10*3/uL (ref 0.7–4.0)
MCH: 26.6 pg (ref 26.0–34.0)
MCHC: 31.4 g/dL (ref 30.0–36.0)
MCV: 84.7 fL (ref 80.0–100.0)
Monocytes Absolute: 1.1 10*3/uL — ABNORMAL HIGH (ref 0.1–1.0)
Monocytes Relative: 7 %
Neutro Abs: 10.8 10*3/uL — ABNORMAL HIGH (ref 1.7–7.7)
Neutrophils Relative %: 75 %
Platelet Count: 555 10*3/uL — ABNORMAL HIGH (ref 150–400)
RBC: 3.53 MIL/uL — ABNORMAL LOW (ref 3.87–5.11)
RDW: 16 % — ABNORMAL HIGH (ref 11.5–15.5)
WBC Count: 14.5 10*3/uL — ABNORMAL HIGH (ref 4.0–10.5)
nRBC: 0 % (ref 0.0–0.2)

## 2021-02-10 LAB — FERRITIN: Ferritin: 530 ng/mL — ABNORMAL HIGH (ref 11–307)

## 2021-02-10 LAB — CMP (CANCER CENTER ONLY)
ALT: 14 U/L (ref 0–44)
AST: 15 U/L (ref 15–41)
Albumin: 3.4 g/dL — ABNORMAL LOW (ref 3.5–5.0)
Alkaline Phosphatase: 90 U/L (ref 38–126)
Anion gap: 7 (ref 5–15)
BUN: 11 mg/dL (ref 8–23)
CO2: 26 mmol/L (ref 22–32)
Calcium: 9.4 mg/dL (ref 8.9–10.3)
Chloride: 105 mmol/L (ref 98–111)
Creatinine: 0.84 mg/dL (ref 0.44–1.00)
GFR, Estimated: >60 mL/min (ref >60)
Glucose, Bld: 103 mg/dL — ABNORMAL HIGH (ref 70–99)
Potassium: 4 mmol/L (ref 3.5–5.1)
Sodium: 138 mmol/L (ref 135–145)
Total Bilirubin: 0.3 mg/dL (ref 0.3–1.2)
Total Protein: 8 g/dL (ref 6.5–8.1)

## 2021-02-10 LAB — IRON AND IRON BINDING CAPACITY (CC-WL,HP ONLY)
Iron: 14 ug/dL — ABNORMAL LOW (ref 28–170)
Saturation Ratios: 6 % — ABNORMAL LOW (ref 10.4–31.8)
TIBC: 220 ug/dL — ABNORMAL LOW (ref 250–450)
UIBC: 206 ug/dL (ref 148–442)

## 2021-02-10 NOTE — Progress Notes (Addendum)
[place]   [phone] [phone]   Clinic New Consult Note   Patient Care Team: Ngetich, Nelda Bucks, NP as PCP - General (Family Medicine) Shirley Muscat Loreen Freud, MD as Referring Physician (Optometry) Latanya Maudlin, MD as Consulting Physician (Orthopedic Surgery) Warden Fillers, MD as Consulting Physician (Ophthalmology) Ladene Artist, MD as Consulting Physician (Gastroenterology) Abbie Sons, MD (Urology) Stechschulte, Nickola Major, MD as Consulting Physician (Surgery) Truitt Merle, MD as Consulting Physician (Hematology) 02/10/2021  CHIEF COMPLAINTS/PURPOSE OF CONSULTATION:  Gastrointestinal stromal tumor, 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 Louanna Raw  HISTORY OF PRESENTING ILLNESS:  Alison [name] 76 y.o. female with PMH including HTN, HL, DM, GERD, diverticulitis s/p partial colectomy 2009, goiter, anxiety, OA, osteopenia, and obesity is here because of newly diagnosed GIST.  She was seen for routine PCP f/up 09/23/2020, found to have new onset mild anemia Hgb 11.3.  She later presented to ED 11/14/2020 with left upper quadrant pain, nausea, and vomiting.  She had been intentionally losing weight. Work-up showed leukocytosis and progressive anemia Hgb 10.2.  CT AP showed a large mass in the left upper quadrant along the greater curvature of the stomach measuring 7.7 x 6.4 cm most compatible with GIST, no obstruction.  She was given pain medicine and discharged home with close GI follow-up.  Hemoglobin dropped to 9.3 on 11/22, she underwent upper endoscopy by Dr. Fuller Plan 12/03/2020 which was completely normal, no mass was seen in the stomach and no biopsies were taken.  She was referred to general surgery and taken to the OR for partial gastrectomy 01/14/2021 by Dr. Louanna Raw. Surgical path showed unifocal spindle cell type GIST at the greater curvature of the stomach measuring 12.8 x10.3 x7.9 cm, grade 2 with mitotic rate > 5 per 5 square  millimeters. Negative margins, LNs not assessed. The risk assessment is high, 86% risk for progressive disease. This was staged pT4Nx. Stains show cells are positive for CD117 and negative for SMA and S1000. Postop course showed acute anemia hgb to 6.8, she was transfused 2 units, she otherwise recovered expectedly and was discharged home 01/17/21. She had outpatient f/up with surgery on 01/29/21 and PCP 02/03/21, Hgb up to 10 with leukocytosis and thrombocytosis.   Socially, she is single and lives with her daughter who is only child.  Patient grew up in [place], then moved to [facility] for her adult life, then back to [place] in 2009.  She is independent with ADLs, does not drive.  She exercises most days of the week with stationary bike.  She smoked less than 1 pack/day cigarettes x50 years, quit 2021, denies alcohol or other drug use.  Patient sister and maternal aunt had breast cancer, she is up-to-date on age-appropriate cancer screenings.  Today she presents with her daughter, ambulating with a cane.  She denies residual abdominal pain, nausea or vomiting.  She is eating well but could drink better.  Bowels are loose at baseline since colon surgery in 2009, she has 2-3 BMs per day.  Takes Benefiber.  She lost 10-15 pounds in the last 3-4 months.  She feels cold but denies fever.  Has occasional exertional dyspnea in the morning then resolves; denies cough, chest pain, leg edema.  She is up and active at home but still rests, feels that she has recovered 75% from surgery.    MEDICAL HISTORY:  Past Medical History:  Diagnosis Date   Anxiety    Benign essential hypertension    Blood transfusion without  reported diagnosis    Cataract    removed bilateraly   Diabetes mellitus without complication ([place])    Diverticulitis of colon (without mention of hemorrhage)(562.11)    GERD (gastroesophageal reflux disease)    Goiter, specified as simple    Helicobacter pylori gastritis    Hyperlipidemia LDL goal <  100    Intrahepatic bile duct dilation    Numbness    TOES RT FOOT and toes left foot   Obesity, unspecified    Osteoarthrosis, unspecified whether generalized or localized, lower leg    Osteopenia    Other specified disease of nail    FUNGUS OF FINGERNAILS   Pernicious anemia    Unspecified glaucoma(365.9)    Unspecified vitamin D deficiency    Wears dentures    Wears glasses     SURGICAL HISTORY: Past Surgical History:  Procedure Laterality Date   BACK SURGERY     CATARACT EXTRACTION     right eye 06/20/15 left eye 09/12/15   COLECTOMY  2009   COLONOSCOPY  09/06/2007   Dr Carol Ada   DILATION AND CURETTAGE OF UTERUS     ENDOSCOPIC RETROGRADE CHOLANGIOPANCREATOGRAPHY (ERCP) WITH PROPOFOL N/A 03/17/2019   Procedure: ENDOSCOPIC RETROGRADE CHOLANGIOPANCREATOGRAPHY (ERCP) WITH PROPOFOL;  Surgeon: Ladene Artist, MD;  Location: [facility] ENDOSCOPY;  Service: Endoscopy;  Laterality: N/A;   JOINT REPLACEMENT     right   LUMBAR LAMINECTOMY/DECOMPRESSION MICRODISCECTOMY N/A 03/30/2013   Procedure: CENTRAL DECOMPRESSION L4 - L5 AND EXCISION 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;  Surgeon: Tobi Bastos, MD;  Location: [facility];  Service: Orthopedics;  Laterality: N/A;   MULTIPLE TOOTH EXTRACTIONS     REMOVAL OF STONES  03/17/2019   Procedure: REMOVAL OF STONES;  Surgeon: Ladene Artist, MD;  Location: [place];  Service: Endoscopy;;   SIGMOIDOSCOPY  02/16/2018   Dr Nira Conn  03/17/2019   Procedure: SPHINCTEROTOMY;  Surgeon: Ladene Artist, MD;  Location: [place];  Service: Endoscopy;;   TOTAL KNEE ARTHROPLASTY Right 02/01/2013   Procedure: RIGHT TOTAL KNEE ARTHROPLASTY;  Surgeon: Tobi Bastos, MD;  Location: [facility];  Service: Orthopedics;  Laterality: Right;   TOTAL KNEE ARTHROPLASTY Left 04/18/2014   Procedure: LEFT TOTAL KNEE ARTHROPLASTY;  Surgeon: Latanya Maudlin, MD;  Location: [facility];  Service: Orthopedics;  Laterality: Left;    SOCIAL HISTORY: Social History    Socioeconomic History   Marital status: Divorced    Spouse name: Not on file   Number of children: 1   Years of education: Not on file   Highest education level: Not on file  Occupational History   Not on file  Tobacco Use   Smoking status: Former    Packs/day: 0.50    Years: 50.00    Pack years: 25.00    Types: Cigarettes    Quit date: 03/2019    Years since quitting: 1.9   Smokeless tobacco: Never  Vaping Use   Vaping Use: Never used  Substance and Sexual Activity   Alcohol use: No   Drug use: No   Sexual activity: Not Currently  Other Topics Concern   Not on file  Social History Narrative   [place] -- [place] -- [place] in 2009   1 healthy adult daughter   Single   Lives with daughter, independent but does not drive   Stationary exercises most days of the week    Social Determinants of Health   Financial Resource Strain: Not on file  Food Insecurity:  Not on file  Transportation Needs: Not on file  Physical Activity: Not on file  Stress: Not on file  Social Connections: Not on file  Intimate Partner Violence: Not on file    FAMILY HISTORY: Family History  Problem Relation Age of Onset   Alzheimer's disease Mother    Stroke Mother    Hyperlipidemia Mother    Heart disease Father    Cancer Sister        breast   Diabetes Sister    Cancer Maternal Aunt        breast   Colon polyps Neg Hx    Colon cancer Neg Hx    Esophageal cancer Neg Hx    Rectal cancer Neg Hx    Stomach cancer Neg Hx     ALLERGIES:  is allergic to aspirin.  MEDICATIONS:  Current Outpatient Medications  Medication Sig Dispense Refill   acetaminophen (TYLENOL) 650 MG CR tablet Take 1,300 mg by mouth in the morning and at bedtime.     amLODipine (NORVASC) 5 MG tablet Take 1 tablet by mouth once daily 90 tablet 1   aspirin EC 81 MG tablet Take 81 mg by mouth daily.     atorvastatin (LIPITOR) 40 MG tablet TAKE 1 TABLET BY MOUTH ONCE DAILY AT  6PM 90 tablet 1   Calcium Carb-Cholecalciferol  (CALCIUM 600 + D PO) Take 1 tablet by mouth in the morning.     carboxymethylcellulose (REFRESH PLUS) 0.5 % SOLN Place 1 drop into both eyes 3 (three) times daily as needed (dry eyes).      celecoxib (CELEBREX) 200 MG capsule Take 200 mg by mouth in the morning.     Cholecalciferol (VITAMIN D-3) 1000 UNITS CAPS Take 1,000 Units by mouth in the morning.     dorzolamide-timolol (COSOPT) 22.3-6.8 MG/ML ophthalmic solution Place 1 drop into both eyes 2 (two) times daily.     gabapentin (NEURONTIN) 300 MG capsule Take 1 capsule by mouth at bedtime 90 capsule 1   Homeopathic Products (THERAWORX RELIEF EX) Apply 1 application topically daily as needed (pain).     latanoprost (XALATAN) 0.005 % ophthalmic solution Place 1 drop into both eyes at bedtime.     Lidocaine HCl 4 % CREA Apply 1 application topically 2 (two) times daily as needed (knee pain.).     lisinopril (ZESTRIL) 30 MG tablet Take 1 tablet (30 mg total) by mouth daily. 90 tablet 1   metFORMIN (GLUCOPHAGE) 500 MG tablet TAKE 1/2 (ONE-HALF) TABLET BY MOUTH IN THE EVENING WITH SUPPER 45 tablet 2   Multiple Vitamin (MULTIVITAMIN WITH MINERALS) TABS tablet Take 1 tablet by mouth daily.     Omega-3 Fatty Acids (OMEGA 3 PO) Take 1,200 mg by mouth in the morning.     pantoprazole (PROTONIX) 40 MG tablet Take 1 tablet (40 mg total) by mouth daily. 30 tablet 11   Zinc Oxide (DESITIN) 40 % PSTE Apply 1 application topically 2 (two) times daily. 60 g 0   No current facility-administered medications for this visit.    REVIEW OF SYSTEMS:   Constitutional: Denies fevers, chills or abnormal night sweats (+) 10-15 lbs weight loss in 3-4 months, somewhat intentional  Eyes: Denies blurriness of vision, double vision or watery eyes Ears, nose, mouth, throat, and face: Denies mucositis or sore throat Respiratory: Denies cough, dyspnea or wheezes (+) DOE in the morning  Cardiovascular: Denies palpitation, chest discomfort or lower extremity  swelling Gastrointestinal:  Denies constipation, bleeding (+) LUQ pain, n/v  at presentation (+) 2-3 loose BM daily since 2009 surgery for diverticulitis  Skin: Denies abnormal skin rashes Lymphatics: Denies new lymphadenopathy or easy bruising Neurological:Denies numbness, tingling or new weaknesses MSK: (+) knee pain, OA s/p knee replacements x2 Behavioral/Psych: Mood is stable, no new changes  All other systems were reviewed with the patient and are negative.  PHYSICAL EXAMINATION: ECOG PERFORMANCE STATUS: 1-2  Vitals:   02/10/21 1043  BP: 130/83  Pulse: 88  Resp: 18  Temp: 98.6 F (37 C)  SpO2: 100%   Filed Weights   02/10/21 1043  Weight: 179 lb 8 oz (81.4 kg)    GENERAL:alert, no distress and comfortable SKIN: no rash  EYES: sclera clear NECK: goiter LYMPH:  no palpable cervical or supraclavicular lymphadenopathy  LUNGS: clear with normal breathing effort HEART: regular rate & rhythm, no lower extremity edema ABDOMEN:abdomen soft, non-tender and normal bowel sounds. Healed umbilical incision, new midline incision below navel and lap incisions, closed healing well without erythema or drainage. No palpable mass  Musculoskeletal:no cyanosis of digits and no clubbing  PSYCH: alert & oriented x 3 with fluent speech NEURO: no focal sensory deficits. 2 hand assist to exam table  LABORATORY DATA:  I have reviewed the data as listed CBC Latest Ref Rng & Units 02/10/2021 02/03/2021 01/22/2021  WBC 4.0 - 10.5 K/uL 14.5(H) 14.3(H) 13.8(H)  Hemoglobin 12.0 - 15.0 g/dL 9.4(L) 10.0(L) 11.5(L)  Hematocrit 36.0 - 46.0 % 29.9(L) 32.0(L) 36.4  Platelets 150 - 400 K/uL 555(H) 717(H) 667(H)   CMP Latest Ref Rng & Units 02/10/2021 01/22/2021 01/17/2021  Glucose 70 - 99 mg/dL 103(H) 103(H) 107(H)  BUN 8 - 23 mg/dL $Remove'11 12 11  'DZWNrBB$ Creatinine 0.44 - 1.00 mg/dL 0.84 0.99 0.75  Sodium 135 - 145 mmol/L 138 136 134(L)  Potassium 3.5 - 5.1 mmol/L 4.0 4.3 4.0  Chloride 98 - 111 mmol/L 105 102 103  CO2  22 - 32 mmol/L 26 28 20(L)  Calcium 8.9 - 10.3 mg/dL 9.4 9.3 8.9  Total Protein 6.5 - 8.1 g/dL 8.0 - -  Total Bilirubin 0.3 - 1.2 mg/dL 0.3 - -  Alkaline Phos 38 - 126 U/L 90 - -  AST 15 - 41 U/L 15 - -  ALT 0 - 44 U/L 14 - -     RADIOGRAPHIC STUDIES: I have personally reviewed the radiological images as listed and agreed with the findings in the report. No results found.  ASSESSMENT & PLAN: 76 yo female with   Gastrointestinal stromal tumor, pT4NxMx, G2, high mitotic rate >5 per HPF -We reviewed her work up including lab, imaging, endoscopy, and surgical path in detail with the patient and daughter.  -She presented with LUQ pain, n/v, and somewhat intentional weight loss. Work up showed acute anemia and large gastric mass on CT, not evident on endoscopy. She underwent partial gastrectomy 01/14/21 by Dr. Louanna Raw.  -We reviewed her surgical path which shows unifocal GIST measuring 12 cm at largest dimention, with high mitotic rate >5 per high power field. No LN evaluation, negative margins. Risk stratification shows 86% risk of recurrence.  -We reviewed that her tumor has been removed, no metastatic disease on CT. She is being referred for CT chest to complete staging.  -Due to her high recurrence risk, we are recommending adjuvant Imatinib (Gleevec) for 3 years to reduce that recurrence risk. We plan to obtain surveillance imaging q6 months for first 2-3 years. --Chemotherapy consent: Side effects including but not limited to fatigue, nausea, vomiting,  diarrhea, rash/pruritis, fluid retention, renal and liver dysfunction, and hematological toxicities (anemia, neutropenia, thrombocytopenia) were discussed with patient in great detail. She agrees to proceed.  -she required 2 uRBCs post-operatively, otherwise has recovered well. Drain removed 01/29/21. Appetite and PS are improving. Other medical co-morbidities are controlled, she appears to be a good candidate for adjuvant TKI. The goal is  curative.  -we will request C-KIT testing on surgical path which predicts better response to [place], if that is positive we plan to start [place] in 2-3 weeks -we will call her with CT chest results -She will return for lab and follow up in 4 weeks, which will be about 2 weeks after she started gleevec.  -Ms. Wamboldt met with Ihor Gully, GI navigator and was seen with Dr. Burr Medico.  LUQ pain, n/v, weight loss -secondary to #1.  -symptoms improved after surgery and have resolved  -she lost 10-15 lbs in past 3 months. I reviewed diet with her  Anemia, leukocytosis, thrombocytosis  -she developed mild anemia at PCP visit 09/2020, Hgb 11.3  -Hgb trended down to 9.3 on 11/18/20, lowest 6.8 01/2021 after surgery -she received 2 units pRBCs with surgery, no iron level done -02/03/21 post op check, labs showed hgb 10.0, with leukocytosis and thrombocytosis, likely reactive -iron panel is pending from today -we also discussed patients s/p gastric surgeries are at risk for B12 deficiency, will check at a later date, especially if anemia does not resolve post op and with adequate iron replacement  HTN, HL, DM, GERD, OA  -meds and f/up per PCP. Activity is somewhat limited by OA of knees -Pt had been losing weight intentionally, I encouraged her to stop now, but to eat healthy diet and hydrate. I offered referral to nutritionist, she will think about it  Social -Pt is single, lives with her daughter. Does not drive -Otherwise independent with ADLs.  -we can refer to SW for resources as needed  Goals of care -we reviewed her high risk for recurrence, >80% give her pathology features.  -she understands the goal of adjuvant Gleevec is curative.   Health promotion  -she is s/p partial colectomy in 2009 for diverticulitis, followed by GI Dr. Fuller Plan and utd on colorectal cancer screenings -[facility] in [facility], last was normal per pt -aged out of PAPs -UTD on vaccines and covid  boosters   PLAN: -Work up reviewed -Lab today (iron panel) -CT chest in 1-2 weeks to complete staging -C-kit testing on surgical sample, will call her with results -Assuming C-kit + start adjuvant Gleevec in 2 weeks -Lab, f/up with Dr. Burr Medico in 4 weeks   Orders Placed This Encounter  Procedures   CT CHEST W CONTRAST    Standing Status:   Future    Standing Expiration Date:   02/10/2022    Order Specific Question:   If indicated for the ordered procedure, I authorize the administration of contrast media per Radiology protocol    Answer:   Yes    Order Specific Question:   Preferred imaging location?    Answer:   [facility]   Ferritin    Standing Status:   Standing    Number of Occurrences:   20    Standing Expiration Date:   02/10/2022   Iron and Iron Binding Capacity ([facility] only)    Standing Status:   Standing    Number of Occurrences:   20    Standing Expiration Date:   02/10/2022   CBC with Differential (Cancer  Center Only)    Standing Status:   Standing    Number of Occurrences:   50    Standing Expiration Date:   02/10/2022   CMP ([place] only)    Standing Status:   Standing    Number of Occurrences:   50    Standing Expiration Date:   02/10/2022    All questions were answered. The patient knows to call the clinic with any problems, questions or concerns.     Alla Feeling, NP 02/10/2021   Addendum I have seen the patient, examined her. I agree with the assessment and and plan and have edited the notes.   I reviewed her surgical path and discussed her risk of recurrence. Due to the large size of her GIST tumor, and high mitotic rate, she is at very high risk of recurrence (86%).  I recommend adjuvant imatinib for 3 years, which will reduce her risk by 30-40%.  We reviewed the dangers side effect of imatinib, she agrees to proceed. Will check cKit mutation to ensure the sensitivity of Imatinib. We also discussed her surveillance, including lab,  physical exam, and routine CT scan.  I will call in imatinib 400 mg daily today, plan to start in about two weeks. All questions were answered.   Truitt Merle  02/10/2021

## 2021-02-10 NOTE — Progress Notes (Signed)
I met with Ms Laatsch and her daughter, Joy before her consultation with Lacie Burton, NP and Dr Feng.  I explained my role as a nurse navigator and provided my contact information. All questions were answered.  They verbalized understanding. ° ° °

## 2021-02-12 ENCOUNTER — Other Ambulatory Visit: Payer: Self-pay

## 2021-02-12 NOTE — Progress Notes (Signed)
The proposed treatment discussed in conference is for discussion purpose only and is not a binding recommendation.  The patients have not been physically examined, or presented with their treatment options.  Therefore, final treatment plans cannot be decided.  

## 2021-02-17 ENCOUNTER — Encounter (HOSPITAL_COMMUNITY): Payer: Self-pay

## 2021-02-17 ENCOUNTER — Other Ambulatory Visit: Payer: Self-pay

## 2021-02-17 ENCOUNTER — Ambulatory Visit (HOSPITAL_COMMUNITY)
Admission: RE | Admit: 2021-02-17 | Discharge: 2021-02-17 | Disposition: A | Discharge status: Home / Self Care | Payer: Medicare Other | Source: Ambulatory Visit | Attending: Nurse Practitioner | Admitting: Nurse Practitioner

## 2021-02-17 DIAGNOSIS — M47814 Spondylosis without myelopathy or radiculopathy, thoracic region: Secondary | ICD-10-CM | POA: Diagnosis not present

## 2021-02-17 DIAGNOSIS — C49A0 Gastrointestinal stromal tumor, unspecified site: Secondary | ICD-10-CM | POA: Insufficient documentation

## 2021-02-17 DIAGNOSIS — I358 Other nonrheumatic aortic valve disorders: Secondary | ICD-10-CM | POA: Diagnosis not present

## 2021-02-17 DIAGNOSIS — I2510 Atherosclerotic heart disease of native coronary artery without angina pectoris: Secondary | ICD-10-CM | POA: Diagnosis not present

## 2021-02-17 MED ORDER — IOHEXOL 300 MG/ML  SOLN
75.0000 mL | Freq: Once | INTRAMUSCULAR | Status: AC | PRN
  Administered 2021-02-17: 75 mL via INTRAVENOUS

## 2021-02-17 MED ORDER — SODIUM CHLORIDE (PF) 0.9 % IJ SOLN
INTRAMUSCULAR | Status: AC
  Filled 2021-02-17: qty 50

## 2021-02-19 ENCOUNTER — Encounter: Payer: Self-pay | Admitting: Podiatry

## 2021-02-19 ENCOUNTER — Ambulatory Visit (INDEPENDENT_AMBULATORY_CARE_PROVIDER_SITE_OTHER): Payer: Medicare Other | Admitting: Podiatry

## 2021-02-19 ENCOUNTER — Other Ambulatory Visit: Payer: Self-pay

## 2021-02-19 ENCOUNTER — Telehealth: Payer: Self-pay

## 2021-02-19 DIAGNOSIS — M2012 Hallux valgus (acquired), left foot: Secondary | ICD-10-CM | POA: Diagnosis not present

## 2021-02-19 DIAGNOSIS — M2042 Other hammer toe(s) (acquired), left foot: Secondary | ICD-10-CM | POA: Diagnosis not present

## 2021-02-19 DIAGNOSIS — L84 Corns and callosities: Secondary | ICD-10-CM

## 2021-02-19 DIAGNOSIS — M79674 Pain in right toe(s): Secondary | ICD-10-CM | POA: Diagnosis not present

## 2021-02-19 DIAGNOSIS — M2041 Other hammer toe(s) (acquired), right foot: Secondary | ICD-10-CM

## 2021-02-19 DIAGNOSIS — E119 Type 2 diabetes mellitus without complications: Secondary | ICD-10-CM

## 2021-02-19 DIAGNOSIS — M2011 Hallux valgus (acquired), right foot: Secondary | ICD-10-CM | POA: Diagnosis not present

## 2021-02-19 DIAGNOSIS — E1142 Type 2 diabetes mellitus with diabetic polyneuropathy: Secondary | ICD-10-CM | POA: Diagnosis not present

## 2021-02-19 DIAGNOSIS — I10 Essential (primary) hypertension: Secondary | ICD-10-CM | POA: Insufficient documentation

## 2021-02-19 DIAGNOSIS — B351 Tinea unguium: Secondary | ICD-10-CM | POA: Diagnosis not present

## 2021-02-19 DIAGNOSIS — M79675 Pain in left toe(s): Secondary | ICD-10-CM | POA: Diagnosis not present

## 2021-02-19 NOTE — Telephone Encounter (Signed)
This nurse reached out to the patient related to the lab results and recommendations per provider.  Left a message for patient to return call to the clinic or to check My Chart message.  No further concerns at this time.

## 2021-02-19 NOTE — Progress Notes (Signed)
ANNUAL DIABETIC FOOT EXAM  Subjective: Alison [name] presents today for for annual diabetic foot examination and painful elongated mycotic toenails 1-5 bilaterally which are tender when wearing enclosed shoe gear. Pain is relieved with periodic professional debridement..  Patient relates 2 year h/o diabetes.  Patient denies any h/o foot wounds  Patient has been diagnosed with neuropathy and it is managed with gabapentin.  Patient does not monitor blood glucose daily.  Risk factors:  starting Gleevec (imatinib) soon for stomach tumor per Oncology, diabetes, diabetic neuropathy, foot deformity, dyslipidemia, HTN.  Alison [name], Alison Bucks, Alison [name] is patient's PCP. Last visit was 01/22/2021.  Past Medical History:  Diagnosis Date   Anxiety    Benign essential hypertension    Blood transfusion without reported diagnosis    Cataract    removed bilateraly   Diabetes mellitus without complication ([place])    Diverticulitis of colon (without mention of hemorrhage)(562.11)    GERD (gastroesophageal reflux disease)    Goiter, specified as simple    Helicobacter pylori gastritis    Hyperlipidemia LDL goal < 100    Intrahepatic bile duct dilation    Numbness    TOES RT FOOT and toes left foot   Obesity, unspecified    Osteoarthrosis, unspecified whether generalized or localized, lower leg    Osteopenia    Other specified disease of nail    FUNGUS OF FINGERNAILS   Pernicious anemia    Unspecified glaucoma(365.9)    Unspecified vitamin D deficiency    Wears dentures    Wears glasses    Patient Active Problem List   Diagnosis Date Noted   Hypertension 02/19/2021   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   On long term drug therapy 04/25/2020   Calculus of bile duct without cholangitis with obstruction    Renal calcification 03/10/2019   Transaminitis 02/04/2019   Abnormal ultrasound of liver 02/04/2019   Sessile rectal polyp 05/02/2018   Internal hemorrhoids without  complication 48/01/6551   Diverticulosis 05/02/2018   Leukoplakia of oral mucosa, 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   Chronic pain of both knees 08/19/2017   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   Cataracts, bilateral 07/08/2015   Hyperlipidemia associated with type 2 diabetes mellitus ([place]) 04/27/2015   Dyslipidemia due to type 2 diabetes mellitus ([place]) 04/27/2015   History of total knee arthroplasty 04/18/2014   History of lumbar laminectomy for spinal cord decompression 05/04/2013   Synovial cyst of lumbar spine 03/31/2013   Spinal stenosis of lumbar region with neurogenic claudication 03/30/2013   Synovial cyst of lumbar facet joint 03/30/2013   Type 2 diabetes mellitus with neurological complications ([place]) 74/82/7078   Osteoarthritis of right knee 02/01/2013   History of total knee replacement 02/01/2013   Hyperglycemia 10/03/2012   Unspecified vitamin D deficiency    Pernicious anemia    Glaucoma (increased eye pressure)    Benign essential hypertension    Osteopenia    Past Surgical History:  Procedure Laterality Date   BACK SURGERY     CATARACT EXTRACTION     right eye 06/20/15 left eye 09/12/15   COLECTOMY  2009   COLONOSCOPY  09/06/2007   Dr Carol Ada   DILATION AND CURETTAGE OF UTERUS     ENDOSCOPIC RETROGRADE CHOLANGIOPANCREATOGRAPHY (ERCP) WITH PROPOFOL N/A 03/17/2019   Procedure: ENDOSCOPIC RETROGRADE CHOLANGIOPANCREATOGRAPHY (ERCP) WITH PROPOFOL;  Surgeon: Ladene Artist, MD;  Location: [facility] ENDOSCOPY;  Service: Endoscopy;  Laterality: N/A;   JOINT REPLACEMENT     right  LUMBAR LAMINECTOMY/DECOMPRESSION MICRODISCECTOMY N/A 03/30/2013   Procedure: CENTRAL DECOMPRESSION L4 - L5 AND EXCISION 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;  Surgeon: Tobi Bastos, MD;  Location: [facility] ORS;  Service: Orthopedics;  Laterality: N/A;   MULTIPLE TOOTH EXTRACTIONS     REMOVAL OF STONES  03/17/2019   Procedure: REMOVAL OF STONES;  Surgeon: Ladene Artist, MD;   Location: [place];  Service: Endoscopy;;   SIGMOIDOSCOPY  02/16/2018   Dr Nira Conn  03/17/2019   Procedure: SPHINCTEROTOMY;  Surgeon: Ladene Artist, MD;  Location: [place];  Service: Endoscopy;;   TOTAL KNEE ARTHROPLASTY Right 02/01/2013   Procedure: RIGHT TOTAL KNEE ARTHROPLASTY;  Surgeon: Tobi Bastos, MD;  Location: [facility];  Service: Orthopedics;  Laterality: Right;   TOTAL KNEE ARTHROPLASTY Left 04/18/2014   Procedure: LEFT TOTAL KNEE ARTHROPLASTY;  Surgeon: Latanya Maudlin, MD;  Location: [facility];  Service: Orthopedics;  Laterality: Left;   Current Outpatient Medications on File Prior to Visit  Medication Sig Dispense Refill   acetaminophen (TYLENOL) 650 MG CR tablet Take 1,300 mg by mouth in the morning and at bedtime.     amLODipine (NORVASC) 5 MG tablet Take 1 tablet by mouth once daily 90 tablet 1   aspirin EC 81 MG tablet Take 81 mg by mouth daily.     atorvastatin (LIPITOR) 40 MG tablet TAKE 1 TABLET BY MOUTH ONCE DAILY AT  6PM 90 tablet 1   Calcium Carb-Cholecalciferol (CALCIUM 600 + D PO) Take 1 tablet by mouth in the morning.     carboxymethylcellulose (REFRESH PLUS) 0.5 % SOLN Place 1 drop into both eyes 3 (three) times daily as needed (dry eyes).      celecoxib (CELEBREX) 200 MG capsule Take 200 mg by mouth in the morning.     Cholecalciferol (VITAMIN D-3) 1000 UNITS CAPS Take 1,000 Units by mouth in the morning.     dorzolamide-timolol (COSOPT) 22.3-6.8 MG/ML ophthalmic solution Place 1 drop into both eyes 2 (two) times daily.     gabapentin (NEURONTIN) 300 MG capsule Take 1 capsule by mouth at bedtime 90 capsule 1   Homeopathic Products (THERAWORX RELIEF EX) Apply 1 application topically daily as needed (pain).     latanoprost (XALATAN) 0.005 % ophthalmic solution Place 1 drop into both eyes at bedtime.     Lidocaine HCl 4 % CREA Apply 1 application topically 2 (two) times daily as needed (knee pain.).     lisinopril (ZESTRIL) 30 MG tablet Take 1  tablet (30 mg total) by mouth daily. 90 tablet 1   metFORMIN (GLUCOPHAGE) 500 MG tablet TAKE 1/2 (ONE-HALF) TABLET BY MOUTH IN THE EVENING WITH SUPPER 45 tablet 2   Multiple Vitamin (MULTIVITAMIN WITH MINERALS) TABS tablet Take 1 tablet by mouth daily.     Omega-3 Fatty Acids (OMEGA 3 PO) Take 1,200 mg by mouth in the morning.     pantoprazole (PROTONIX) 40 MG tablet Take 1 tablet (40 mg total) by mouth daily. 30 tablet 11   Zinc Oxide (DESITIN) 40 % PSTE Apply 1 application topically 2 (two) times daily. 60 g 0   No current facility-administered medications on file prior to visit.    Allergies  Allergen Reactions   Aspirin Nausea Only and Nausea And Vomiting    Can take 81 mg but can't take 325mg , hard on stomach. Can take 81 mg but can't take 325mg , hard on stomach.   Social History   Occupational History   Not on file  Tobacco Use   Smoking status: Former    Packs/day: 0.50    Years: 50.00    Pack years: 25.00    Types: Cigarettes    Quit date: 03/2019    Years since quitting: 1.9   Smokeless tobacco: Never  Vaping Use   Vaping Use: Never used  Substance and Sexual Activity   Alcohol use: No   Drug use: No   Sexual activity: Not Currently   Family History  Problem Relation Age of Onset   Alzheimer's disease Mother    Stroke Mother    Hyperlipidemia Mother    Heart disease Father    Cancer Sister        breast   Diabetes Sister    Cancer Maternal Aunt        breast   Colon polyps Neg Hx    Colon cancer Neg Hx    Esophageal cancer Neg Hx    Rectal cancer Neg Hx    Stomach cancer Neg Hx    Immunization History  Administered Date(s) Administered   Fluad Quad(high Dose 65+) 09/05/2018, 09/26/2020   Influenza, High Dose Seasonal PF 10/02/2017, 11/20/2019   Influenza,inj,Quad PF,6+ Mos 10/03/2012, 09/21/2014, 11/07/2015   Influenza,inj,quad, With Preservative 11/12/2016   Influenza-Unspecified 10/28/2013   PFIZER(Purple Top)SARS-COV-2 Vaccination 03/25/2019,  03/25/2019, 04/18/2019, 11/01/2019, 05/23/2020, 10/07/2020   Pneumococcal Conjugate-13 01/04/2014, 01/04/2014, 11/04/2016   Pneumococcal Polysaccharide-23 12/29/2012, 11/19/2017   Zoster Recombinat (Shingrix) 11/25/2016, 01/26/2017     Review of Systems: Negative except as noted in the HPI.   Objective: There were no vitals filed for this visit.  Alison [name] is a pleasant 76 y.o. female in NAD. AAO X 3.  Vascular Examination: CFT <3 seconds b/l LE. Faintly palpable pedal pulses b/l LE. Pedal hair absent b/l LE. Skin temperature gradient WNL b/l. No pain with calf compression b/l. No edema b/l LE. No cyanosis or clubbing noted b/l LE.  Dermatological Examination: Pedal skin thin and atrophic b/l LE. No open wounds b/l LE. No interdigital macerations noted b/l LE. Toenails 1-5 b/l elongated, discolored, dystrophic, thickened, crumbly with subungual debris and tenderness to dorsal palpation. Hyperkeratotic lesion(s) bilateral 5th toes and submet head 5 b/l.  No erythema, no edema, no drainage, no fluctuance.  Macule, tear drop shaped, noted lateral aspect right 5th metatarsal head measuring 0.5 x 0.3 cm, flat. Vareigated color. No elevation, no palpable subcutaneous depth. No bleeding  Musculoskeletal Examination: Normal muscle strength 5/5 to all lower extremity muscle groups bilaterally. HAV with bunion deformity noted b/l LE. Hammertoe deformity noted 2-5 b/l.Marland Kitchen No pain, crepitus or joint limitation noted with ROM b/l LE.  Patient ambulates independently without assistive aids.  Footwear Assessment: Does the patient wear appropriate shoes? Yes. Does the patient need inserts/orthotics? Yes.  Neurological Examination: Pt has subjective symptoms of neuropathy. Protective sensation decreased with 10 gram monofilament b/l. Vibratory sensation intact b/l.  Hemoglobin A1C Latest Ref Rng & Units 01/03/2021 09/23/2020 05/13/2020  HGBA1C 4.8 - 5.6 % 5.5 5.8(H) 6.0(H)  Some recent data might be  hidden   Assessment: 1. Pain due to onychomycosis of toenails of both feet   2. Corns and callosities   3. Hallux valgus, acquired, bilateral   4. Acquired hammertoes of both feet   5. Diabetic peripheral neuropathy associated with type 2 diabetes mellitus ([place])   6. 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 Cherre Robins. Per Oncology, she will be starting [place] soon for stomach tumor. -We will monitor macule lateral 5th metatarsal head right foot. -Diabetic foot examination performed today. -Continue foot and shoe inspections daily. Monitor blood glucose per PCP/Endocrinologist's recommendations. -Mycotic toenails 1-5 bilaterally were debrided in length and girth with sterile nail nippers and dremel without incident. -Corn(s) bilateral 5th toes and callus(es) submet head 5 b/l were pared utilizing sterile scalpel blade without incident. Total number debrided =4. -Patient/POA to call should there be question/concern in the interim.  Return in about 3 months (around 05/19/2021).  Marzetta Board, DPM

## 2021-02-19 NOTE — Telephone Encounter (Signed)
-----  Message from Alla Feeling, NP sent at 02/19/2021  9:36 AM EST ----- Please let pt know labs. She has mild anemia Hgb 9.4, but ferritin is elevated. She may have small component of iron deficiency but this is probably reactive/inflammation from recent GIST and surgery. I do not recommend IV iron for now. WBC slightly elevated also likely from surgery but please check for s/sx of infection. We are still waiting for c-kit mutation testing will notify her shortly.   Thanks, Regan Rakers NP

## 2021-02-20 ENCOUNTER — Telehealth: Payer: Self-pay

## 2021-02-24 ENCOUNTER — Telehealth: Payer: Self-pay | Admitting: Pharmacist

## 2021-02-24 ENCOUNTER — Other Ambulatory Visit: Payer: Self-pay | Admitting: Hematology

## 2021-02-24 ENCOUNTER — Encounter (HOSPITAL_COMMUNITY): Payer: Self-pay | Admitting: Hematology

## 2021-02-24 ENCOUNTER — Other Ambulatory Visit (HOSPITAL_COMMUNITY): Payer: Self-pay

## 2021-02-24 ENCOUNTER — Telehealth: Payer: Self-pay

## 2021-02-24 DIAGNOSIS — C49A0 Gastrointestinal stromal tumor, unspecified site: Secondary | ICD-10-CM

## 2021-02-24 MED ORDER — IMATINIB MESYLATE 400 MG PO TABS
400.0000 mg | ORAL_TABLET | Freq: Every day | ORAL | 3 refills | Status: DC
  Filled 2021-02-24: qty 30, 30d supply, fill #0

## 2021-02-24 NOTE — Telephone Encounter (Signed)
Oral Oncology Patient Advocate Encounter   Received notification from [facility] that prior authorization for [place] is required.   PA submitted on CoverMyMeds Key [id number] Status is pending   Oral Oncology Clinic will continue to follow.  [place] Patient [place] Phone [phone] Fax [phone] 02/24/2021 9:57 AM

## 2021-02-24 NOTE — Telephone Encounter (Signed)
Oral Oncology Pharmacist Encounter  Received new prescription for Gleevec (imatinib) for the adjuvant treatment of GIST, planned duration 3 years.  CBC w/ Diff and CMP from 02/10/21 assessed, no relevant lab abnormalities noted. Prescription dose and frequency assessed for appropriateness. Appropriate for therapy initiation.   Current medication list in Epic reviewed, DDIs with Gleevec identified: Category C DDI between [place] and Amlodipine: Gleevec, a CYP3A4 inhibitor may increase serum concentrations of amlodipine. Recommend monitoring for increased ADEs of amlodipine including but not limited to hypotension. Patient's most recent BP on 02/10/21 was 130/83 mmHg. No change in therapy warranted at this time.  Category C DDI between [place] and Atorvastatin: Gleevec, a CYP3A4 inhibitor may increase serum concentrations of atorvastatin. Recommend monitoring for increased ADEs of atorvastatin including but not limited to muscle aches, dark colored urine, increase in LFTs. No change in therapy warranted at this time.  Category C DDI between [place] and Acetaminophen: Concomitant use of agents may increase risk of hepatotoxicity. Will confirm if patient still utilizing acetaminophen, would only recommend PRN use at most to decrease risk of interaction.   Evaluated chart and no patient barriers to medication adherence noted.   Patient agreement for treatment documented in MD note on 02/10/21.  Prescription has been e-scribed to the [facility] for benefits analysis and approval.  Oral Oncology Clinic will continue to follow for insurance authorization, copayment issues, initial counseling and start date.  Leron Croak, PharmD, BCPS Hematology/Oncology Clinical Pharmacist [facility] and [place] [phone] 02/24/2021 10:15 AM

## 2021-02-24 NOTE — Telephone Encounter (Signed)
Oral Oncology Patient Advocate Encounter  Prior Authorization for [place] has been approved.    PA# [id number] Effective dates: 01/12/21 through 01/11/22  Patients co-pay is $953.73  Oral Oncology Clinic will continue to follow.   [place] Patient [place] Phone [phone] Fax [phone] 02/24/2021 9:59 AM

## 2021-02-25 NOTE — Telephone Encounter (Signed)
Oral Chemotherapy Pharmacist Encounter   Spoke with patient today to follow up regarding patient's oral chemotherapy medication: Gleevec (imatinib)  Discussed high copay with patient, which is unaffordable for her. We discussed using [facility], but patient does not have an email, thus cannot sign up for their program. She requested I look into other nearby pharmacies to see if they are able to obtain the medication and at what cost.   Patient request call back 02/26/21 to discuss options of obtaining medication.   Leron Croak, PharmD, BCPS Hematology/Oncology Clinical Pharmacist [facility] and [place] [phone] 02/25/2021 1:09 PM

## 2021-02-25 NOTE — Progress Notes (Signed)
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  I left a vm requesting her to return my call.  I provided my direct contact number. [phone]: Ms Uphoff return my call.  I reviewed the C-Kit results.  I told her that results indicate the Gleevec can be used to treat the GIST.  I told her that our oral chemotherapy pharmacist is working on the prescription and finding a pharmacy to sue with a low copay for Ms Cirilo.  I told her that Rebecca will be calling her to review the use of Gleevec.  All questions were answered.  She verbalized understanding. ° °

## 2021-02-26 MED ORDER — IMATINIB MESYLATE 400 MG PO TABS: 400.0000 mg | ORAL_TABLET | Freq: Every day | ORAL | 3 refills | Status: DC

## 2021-02-26 NOTE — Telephone Encounter (Signed)
Oral Chemotherapy Pharmacist Encounter    Spoke with patient and patient's daughter today to follow up regarding patient's oral chemotherapy medication: Gleevec (imatinib)  Patient and patient's daughter would like to obtain Imatinib through [facility]. Patient's daughter, Joselyn Edling has made an account for her mother and prescription has been redirected to [facility] for Dispensing.   Patient and daughter informed it typically takes 1-2 business days for prescription to be uploaded to patient's profile through [facility] to then order the medication for delivery. Patient and patient's daughter have the oral chemotherapy clinic phone number for any questions/concerns.   Oral Oncology Clinic will continue to follow for initial counseling of medication once delivered to patient.  Leron Croak, PharmD, BCPS Hematology/Oncology Clinical Pharmacist [facility] and [place] [phone] 02/26/2021 1:51 PM

## 2021-02-28 ENCOUNTER — Ambulatory Visit (INDEPENDENT_AMBULATORY_CARE_PROVIDER_SITE_OTHER): Payer: Medicare Other | Admitting: Family

## 2021-02-28 ENCOUNTER — Encounter: Payer: Self-pay | Admitting: Family

## 2021-02-28 ENCOUNTER — Other Ambulatory Visit: Payer: Self-pay

## 2021-02-28 VITALS — BP 150/80 | HR 70 | Temp 97.6°F | Resp 16 | Ht 65.0 in | Wt 180.6 lb

## 2021-02-28 DIAGNOSIS — B379 Candidiasis, unspecified: Secondary | ICD-10-CM

## 2021-02-28 MED ORDER — NYSTATIN 100000 UNIT/GM EX CREA: 1.0000 "application " | TOPICAL_CREAM | Freq: Two times a day (BID) | CUTANEOUS | 1 refills | Status: DC

## 2021-02-28 NOTE — Patient Instructions (Signed)
Skin Yeast Infection A skin yeast infection is a condition in which there is an overgrowth of yeast (Candida) that normally lives on the skin. This condition usually occurs in areas of the skin that are constantly warm and moist, such as the skin under the breasts or armpits, or in the groin and other body folds. What are the causes? This condition is caused by a change in the normal balance of the yeast that live on the skin. What increases the risk? You are more likely to develop this condition if you: Are obese. Are pregnant. Are 24 years of age or older. Wear tight clothing. Have any of the following conditions: Diabetes. Malnutrition. A weak body defense system (immune system). Take medicines such as: Birth control pills. Antibiotics. Steroid medicines. What are the signs or symptoms? The most common symptom of this condition is itchiness in the affected area. Other symptoms include: A red, swollen area of the skin. Bumps on the skin. How is this diagnosed? This condition is diagnosed with a medical history and physical exam. Your health care provider may check for yeast by taking scrapings of the skin to be viewed under a microscope. How is this treated? This condition is treated with medicine. Medicines may be prescribed or available over the counter. The medicines may be: Taken by mouth (orally). Applied as a cream or powder to your skin. Follow these instructions at home:  Take or apply over-the-counter and prescription medicines only as told by your health care provider. Maintain a healthy weight. If you need help losing weight, talk with your health care provider. Keep your skin clean and dry. Wear loose-fitting clothing. If you have diabetes, keep your blood sugar under control. Keep all follow-up visits. This is important. Contact a health care provider if: Your symptoms go away and then come back. Your symptoms do not get better with treatment. Your symptoms get  worse. Your rash spreads. You have a fever or chills. You have new symptoms. You have new warmth or redness of your skin. Your rash is painful or bleeding. Summary A skin yeast infection is a condition in which there is an overgrowth of yeast (Candida) that normally lives on the skin. Take or apply over-the-counter and prescription medicines only as told by your health care provider. Keep your skin clean and dry. Contact a health care provider if your symptoms do not get better with treatment. This information is not intended to replace advice given to you by your health care provider. Make sure you discuss any questions you have with your health care provider. Document Revised: 03/19/2020 Document Reviewed: 03/19/2020 Elsevier Patient Education  [place].

## 2021-02-28 NOTE — Progress Notes (Signed)
Provider: Marlowe Sax FNP-C  Suhaila Troiano, Nelda Bucks, NP  Patient Care Team: Isaih Bulger, Nelda Bucks, NP as PCP - General (Family Medicine) Shirley Muscat Loreen Freud, MD as Referring Physician (Optometry) Latanya Maudlin, MD as Consulting Physician (Orthopedic Surgery) Warden Fillers, MD as Consulting Physician (Ophthalmology) Ladene Artist, MD as Consulting Physician (Gastroenterology) Abbie Sons, MD (Urology) Stechschulte, Nickola Major, MD as Consulting Physician (Surgery) Truitt Merle, MD as Consulting Physician (Hematology)  Extended Emergency Contact Information Primary Emergency Contact: Comp,[name] Address: [address]          [place], [place] [address] [name] of [place] Phone: [phone] Mobile Phone: [phone] Relation: Daughter Secondary Emergency Contact: [facility] Address: [address]          [place], [place] [address] [name] of [place] Phone: [phone] Mobile Phone: [phone] Relation: Sister  Code Status:  Full Code  Goals of care: Advanced Directive information Advanced Directives 02/28/2021  Does Patient Have a Medical Advance Directive? No  Type of Advance Directive -  Does patient want to make changes to medical advance directive? -  Would patient like information on creating a medical advance directive? No - Patient declined  Pre-existing out of facility DNR order (yellow form or pink MOST form) -     Chief Complaint  Patient presents with   Acute Visit    Patient complains of rash under her stomach.    HPI:  Pt is a 76 y.o. female seen today for an acute visit for evaluation of rash on abdominal fold  x several days. States rash has been itching.No drainage.No change of lotion,soap or detergent.Has not applied any medication to rash. No fever or chills.   Past Medical History:  Diagnosis Date   Anxiety    Benign essential hypertension    Blood transfusion without reported diagnosis    Cataract    removed bilateraly    Diabetes mellitus without complication ([place])    Diverticulitis of colon (without mention of hemorrhage)(562.11)    GERD (gastroesophageal reflux disease)    Goiter, specified as simple    Helicobacter pylori gastritis    Hyperlipidemia LDL goal < 100    Intrahepatic bile duct dilation    Numbness    TOES RT FOOT and toes left foot   Obesity, unspecified    Osteoarthrosis, unspecified whether generalized or localized, lower leg    Osteopenia    Other specified disease of nail    FUNGUS OF FINGERNAILS   Pernicious anemia    Unspecified glaucoma(365.9)    Unspecified vitamin D deficiency    Wears dentures    Wears glasses    Past Surgical History:  Procedure Laterality Date   BACK SURGERY     CATARACT EXTRACTION     right eye 06/20/15 left eye 09/12/15   COLECTOMY  2009   COLONOSCOPY  09/06/2007   Dr Carol Ada   DILATION AND CURETTAGE OF UTERUS     ENDOSCOPIC RETROGRADE CHOLANGIOPANCREATOGRAPHY (ERCP) WITH PROPOFOL N/A 03/17/2019   Procedure: ENDOSCOPIC RETROGRADE CHOLANGIOPANCREATOGRAPHY (ERCP) WITH PROPOFOL;  Surgeon: Ladene Artist, MD;  Location: [facility] ENDOSCOPY;  Service: Endoscopy;  Laterality: N/A;   JOINT REPLACEMENT     right   LUMBAR LAMINECTOMY/DECOMPRESSION MICRODISCECTOMY N/A 03/30/2013   Procedure: CENTRAL DECOMPRESSION L4 - L5 AND EXCISION 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;  Surgeon: Tobi Bastos, MD;  Location: [facility] ORS;  Service: Orthopedics;  Laterality: N/A;   MULTIPLE TOOTH EXTRACTIONS     REMOVAL OF STONES  03/17/2019  Procedure: REMOVAL OF STONES;  Surgeon: Ladene Artist, MD;  Location: [facility];  Service: Endoscopy;;   SIGMOIDOSCOPY  02/16/2018   Dr Nira Conn  03/17/2019   Procedure: SPHINCTEROTOMY;  Surgeon: Ladene Artist, MD;  Location: [facility];  Service: Endoscopy;;   TOTAL KNEE ARTHROPLASTY Right 02/01/2013   Procedure: RIGHT TOTAL KNEE ARTHROPLASTY;  Surgeon: Tobi Bastos, MD;  Location: [facility];  Service: Orthopedics;   Laterality: Right;   TOTAL KNEE ARTHROPLASTY Left 04/18/2014   Procedure: LEFT TOTAL KNEE ARTHROPLASTY;  Surgeon: Latanya Maudlin, MD;  Location: [facility];  Service: Orthopedics;  Laterality: Left;    Allergies  Allergen Reactions   Aspirin Nausea Only and Nausea And Vomiting    Can take 81 mg but can't take 325mg , hard on stomach. Can take 81 mg but can't take 325mg , hard on stomach.    Outpatient Encounter Medications as of 02/28/2021  Medication Sig   acetaminophen (TYLENOL) 650 MG CR tablet Take 1,300 mg by mouth in the morning and at bedtime.   amLODipine (NORVASC) 5 MG tablet Take 1 tablet by mouth once daily   aspirin EC 81 MG tablet Take 81 mg by mouth daily.   atorvastatin (LIPITOR) 40 MG tablet TAKE 1 TABLET BY MOUTH ONCE DAILY AT  6PM   Calcium Carb-Cholecalciferol (CALCIUM 600 + D PO) Take 1 tablet by mouth in the morning.   carboxymethylcellulose (REFRESH PLUS) 0.5 % SOLN Place 1 drop into both eyes 3 (three) times daily as needed (dry eyes).    celecoxib (CELEBREX) 200 MG capsule Take 200 mg by mouth in the morning.   Cholecalciferol (VITAMIN D-3) 1000 UNITS CAPS Take 1,000 Units by mouth in the morning.   dorzolamide-timolol (COSOPT) 22.3-6.8 MG/ML ophthalmic solution Place 1 drop into both eyes 2 (two) times daily.   gabapentin (NEURONTIN) 300 MG capsule Take 1 capsule by mouth at bedtime   Homeopathic Products (THERAWORX RELIEF EX) Apply 1 application topically daily as needed (pain).   imatinib (GLEEVEC) 400 MG tablet Take 1 tablet (400 mg total) by mouth daily. Take with meals and large glass of water.Caution:Chemotherapy.   latanoprost (XALATAN) 0.005 % ophthalmic solution Place 1 drop into both eyes at bedtime.   Lidocaine HCl 4 % CREA Apply 1 application topically 2 (two) times daily as needed (knee pain.).   lisinopril (ZESTRIL) 30 MG tablet Take 1 tablet (30 mg total) by mouth daily.   metFORMIN (GLUCOPHAGE) 500 MG tablet TAKE 1/2 (ONE-HALF) TABLET BY MOUTH IN THE  EVENING WITH SUPPER   Multiple Vitamin (MULTIVITAMIN WITH MINERALS) TABS tablet Take 1 tablet by mouth daily.   neomycin-bacitracin-polymyxin (NEOSPORIN) ointment Apply 1 application topically as needed for wound care.   Omega-3 Fatty Acids (OMEGA 3 PO) Take 1,200 mg by mouth in the morning.   pantoprazole (PROTONIX) 40 MG tablet Take 1 tablet (40 mg total) by mouth daily.   Zinc Oxide (DESITIN EX) Apply 1 application topically as needed.   No facility-administered encounter medications on file as of 02/28/2021.    Review of Systems  Constitutional:  Negative for appetite change, chills, fatigue and fever.  Gastrointestinal:  Negative for abdominal distention, abdominal pain, diarrhea, nausea and vomiting.  Skin:  Positive for rash. Negative for color change and pallor.  Neurological:  Negative for dizziness, speech difficulty, light-headedness and headaches.   Immunization History  Administered Date(s) Administered   Fluad Quad(high Dose 65+) 09/05/2018, 09/26/2020   Influenza, High Dose Seasonal PF 10/02/2017, 11/20/2019  Influenza,inj,Quad PF,6+ Mos 10/03/2012, 09/21/2014, 11/07/2015   Influenza,inj,quad, With Preservative 11/12/2016   Influenza-Unspecified 10/28/2013   PFIZER(Purple Top)SARS-COV-2 Vaccination 03/25/2019, 03/25/2019, 04/18/2019, 11/01/2019, 05/23/2020, 10/07/2020   Pneumococcal Conjugate-13 01/04/2014, 01/04/2014, 11/04/2016   Pneumococcal Polysaccharide-23 12/29/2012, 11/19/2017   Zoster Recombinat (Shingrix) 11/25/2016, 01/26/2017   Pertinent  Health Maintenance Due  Topic Date Due   HEMOGLOBIN A1C  07/04/2021   OPHTHALMOLOGY EXAM  09/23/2021   FOOT EXAM  02/19/2022   COLONOSCOPY (Pts 45-78yrs Insurance coverage will need to be confirmed)  02/17/2023   INFLUENZA VACCINE  Completed   DEXA SCAN  Completed   Fall Risk 01/16/2021 01/17/2021 01/17/2021 02/10/2021 02/28/2021  Falls in the past year? - - - - 0  Was there an injury with Fall? - - - - 0  Fall Risk  Category Calculator - - - - 0  Fall Risk Category - - - - Low  Patient Fall Risk Level Moderate fall risk Moderate fall risk Moderate fall risk Low fall risk Low fall risk  Patient at Risk for Falls Due to - - - - No Fall Risks  Fall risk Follow up - - - - Falls evaluation completed   Functional Status Survey:    Vitals:   02/28/21 1521  BP: (!) 150/80  Pulse: 70  Resp: 16  Temp: 97.6 F (36.4 C)  SpO2: 96%  Weight: 180 lb 9.6 oz (81.9 kg)  Height: 5\' 5"  (1.651 m)   Body mass index is 30.05 kg/m. Physical Exam Constitutional:      General: She is not in acute distress.    Appearance: She is not ill-appearing.  Pulmonary:     Effort: Pulmonary effort is normal. No respiratory distress.     Breath sounds: Normal breath sounds. No wheezing, rhonchi or rales.  Chest:     Chest wall: No tenderness.  Abdominal:     General: Bowel sounds are normal. There is no distension.     Palpations: Abdomen is soft. There is no mass.     Tenderness: There is no abdominal tenderness. There is no guarding or rebound.  Skin:    General: Skin is warm.     Coloration: Skin is not pale.     Findings: No bruising, erythema or lesion.     Comments: Abdominal skin fold with gray-silver colored patch area with fissure noted.No odor or drainage noted.   Neurological:     Mental Status: She is alert and oriented to person, place, and time.     Motor: No weakness.     Gait: Gait abnormal.  Psychiatric:        Mood and Affect: Mood normal.        Behavior: Behavior normal.    Labs reviewed: Recent Labs    01/17/21 0444 01/22/21 1355 02/10/21 1218  NA 134* 136 138  K 4.0 4.3 4.0  CL 103 102 105  CO2 20* 28 26  GLUCOSE 107* 103* 103*  BUN 11 12 11   CREATININE 0.75 0.99 0.84  CALCIUM 8.9 9.3 9.4   Recent Labs    11/14/20 2054 11/18/20 1231 02/10/21 1218  AST 23 19 15   ALT 15 14 14   ALKPHOS 57  --  90  BILITOT 0.7 0.6 0.3  PROT 8.1 7.4 8.0  ALBUMIN 4.2  --  3.4*   Recent Labs     01/22/21 1355 02/03/21 1046 02/10/21 1218  WBC 13.8* 14.3* 14.5*  NEUTROABS 9,398* 10,253* 10.8*  HGB 11.5* 10.0* 9.4*  HCT 36.4 32.0*  29.9*  MCV 87.1 85.8 84.7  PLT 667* 717* 555*   Lab Results  Component Value Date   TSH 0.48 09/04/2019   Lab Results  Component Value Date   HGBA1C 5.5 01/03/2021   Lab Results  Component Value Date   CHOL 122 09/23/2020   HDL 38 (L) 09/23/2020   LDLCALC 64 09/23/2020   TRIG 113 09/23/2020   CHOLHDL 3.2 09/23/2020    Significant Diagnostic Results in last 30 days:  CT CHEST W CONTRAST  Result Date: 02/18/2021 CLINICAL DATA:  GI stromal tumor staging EXAM: CT CHEST WITH CONTRAST TECHNIQUE: Multidetector CT imaging of the chest was performed during intravenous contrast administration. RADIATION DOSE REDUCTION: This exam was performed according to the departmental dose-optimization program which includes automated exposure control, adjustment of the mA and/or kV according to patient size and/or use of iterative reconstruction technique. CONTRAST:  44mL OMNIPAQUE IOHEXOL 300 MG/ML  SOLN COMPARISON:  09/20/2018 and overlapping portions CT abdomen 11/14/2020 FINDINGS: Cardiovascular: Coronary, aortic arch, and branch vessel atherosclerotic vascular disease. Calcifications noted along the aortic valve to a lesser degree along the mitral valve. Mediastinum/Nodes: Subcarinal lymph node 1.3 cm in short axis on image 69 series 2. This is within normal limits for this location although increased from 09/20/2018 where this lymph node measured about 0.8 cm in short axis. Lungs/Pleura: Tree-in-bud reticulonodular opacities posteriorly in the right middle lobe for example on image 95 series 5, favoring atypical infectious bronchiolitis. 6 by 4 mm sub solid right middle lobe nodule on image 94 series 5, no change from 02/02/2013, considered benign. There is some additional tree-in-bud reticulonodular opacity inferiorly in the right middle lobe. Mild lingular  scarring versus subsegmental atelectasis. No compelling findings of metastatic disease to the chest. Upper Abdomen: Pneumobilia. We image the top of a rim enhancing process with central hypodensity just posterior to the stomach on the bottom most images such as image 145 of series 2. Reportedly the tumor was resected 01/14/2021, it is possible that this is simply a postoperative fluid collection or pseudo cyst but the process is only partially included on today's exam. There is indistinctness of tissue planes posterior to the visualized part of the stomach. Musculoskeletal: Mild thoracic spondylosis. IMPRESSION: 1. Upper normal subcarinal lymph node 1.3 cm in short axis, but no specific indicators metastatic disease to the chest at this time. 2. On the bottom most images, there is rim enhancement and central hypodensity along with indistinct tissue planes along the posterior gastric margin presumably in the vicinity of the resection site. Although this may simply be postoperative findings and a postoperative fluid collection, cannot exclude pseudocyst or other pathology in this vicinity. If the patient is having an atypical postoperative course, abnormal lipase, or if otherwise clinically warranted, this could be investigated with dedicated abdominal CT. 3. Tree-in-bud reticulonodular opacities in the right middle lobe compatible with atypical infectious bronchiolitis. A 5 mm in average diameter sub solid right middle lobe nodule is unchanged from 2015 considered benign. 4. Other imaging findings of potential clinical significance: Aortic Atherosclerosis (ICD10-I70.0). Aortic branch vessel atherosclerosis. Coronary atherosclerosis. Pneumobilia. Electronically Signed   By: Van Clines M.D.   On: 02/18/2021 15:42    Assessment/Plan  Candidiasis Afebrile  Abdominal skin fold with gray-silver colored patch area with fissure noted.No odor or drainage noted.  - advised to keep skin fold dry after showering   - additional education information provider on AVS  - nystatin cream (MYCOSTATIN); Apply 1 application topically 2 (two) times daily.  Dispense: 30 g; Refill: 1  - Notify provider if symptoms worsen or fail to resolve  Family/ staff Communication: Reviewed plan of care with patient verbalized understanding   Labs/tests ordered: None   Next Appointment: As needed if symptoms worsen or fail to improve    Sandrea Hughs, NP

## 2021-03-03 ENCOUNTER — Ambulatory Visit: Payer: Medicare Other | Admitting: Family

## 2021-03-03 NOTE — Telephone Encounter (Addendum)
Oral Chemotherapy Pharmacist Encounter   Spoke with patient and patient's daughter today to follow up regarding patient's oral chemotherapy medication: Gleevec (imatinib)  Patient's daughter was able to place order for medication 02/28/21. The estimated delivery date to the patient's home is currently set as 03/09/21 per the confirmation email they received from [facility].   I will follow up with patient and patient's daughter for initial counseling on 03/06/21 so patient can start medication as soon as it arrives from the pharmacy.  Leron Croak, PharmD, BCPS Hematology/Oncology Clinical Pharmacist [facility] [place] [phone] 03/03/2021 9:13 AM

## 2021-03-04 DIAGNOSIS — Z79899 Other long term (current) drug therapy: Secondary | ICD-10-CM

## 2021-03-04 DIAGNOSIS — E7849 Other hyperlipidemia: Secondary | ICD-10-CM

## 2021-03-04 DIAGNOSIS — E1149 Type 2 diabetes mellitus with other diabetic neurological complication: Secondary | ICD-10-CM

## 2021-03-04 NOTE — Addendum Note (Signed)
Addended by: Ihor Gully A on: 03/04/2021 11:56 AM   Modules accepted: Orders

## 2021-03-04 NOTE — Telephone Encounter (Signed)
Chart review. No changes made at this time.

## 2021-03-04 NOTE — Progress Notes (Signed)
Alison [name] will not start Bude until 03/09/2021.  I have moved her appts for 2 week f/u to 03/28/2021 per Dr Burr Medico.  I reviewed this with Alison [name].  All questions were answered.  she verbalized understanding.

## 2021-03-05 NOTE — Telephone Encounter (Signed)
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  Counseled patient on administration, dosing, side effects, monitoring, drug-food interactions, safe handling, storage, and disposal.  Patient will take Gleevec 400mg  tablets, 1 tablet (400mg ) by mouth once daily with a meal and a large glass of water.  Patient knows food may decrease stomach irritation and to maintain hydration while on treatment with Gleevec.  Patient counseled to avoid grapefruit and grapefruit juice.  Gleevec start date: 03/05/21  Adverse effects include but are not limited to: nausea, vomiting, diarrhea, fatigue, muscle cramps, lower extremity edema, rash, decreased blood counts, GI bleeding, and cardiac dysfunction.  Patient will obtain anti diarrheal and alert the office of 4 or more loose stools above baseline.  Reviewed with patient importance of keeping a medication schedule and plan for any missed doses. No barriers to medication adherence identified.  Medication reconciliation performed and medication/allergy list updated.  Insurance authorization for [employer] has been obtained. Due to high copay patient is obtaining medication through [facility] pharmacy.   All questions answered.  Ms. Loree voiced understanding and appreciation.   Medication education handout placed in mail for patient. Patient knows to call the office with questions or concerns. Oral Chemotherapy Clinic phone number provided to patient.   Leron Croak, PharmD, BCPS Hematology/Oncology Clinical Pharmacist [facility] [place] [phone] 03/05/2021 8:40 AM

## 2021-03-07 ENCOUNTER — Other Ambulatory Visit: Payer: Medicare Other

## 2021-03-10 ENCOUNTER — Ambulatory Visit: Payer: Medicare Other | Admitting: Hematology

## 2021-03-10 ENCOUNTER — Other Ambulatory Visit: Payer: Medicare Other

## 2021-03-14 LAB — SURGICAL PATHOLOGY

## 2021-03-15 ENCOUNTER — Other Ambulatory Visit: Payer: Self-pay | Admitting: Family

## 2021-03-15 DIAGNOSIS — I10 Essential (primary) hypertension: Secondary | ICD-10-CM

## 2021-03-28 ENCOUNTER — Other Ambulatory Visit: Payer: Self-pay

## 2021-03-28 ENCOUNTER — Inpatient Hospital Stay (HOSPITAL_BASED_OUTPATIENT_CLINIC_OR_DEPARTMENT_OTHER): Payer: Medicare Other | Admitting: Hematology

## 2021-03-28 ENCOUNTER — Inpatient Hospital Stay: Payer: Medicare Other | Attending: Hematology

## 2021-03-28 ENCOUNTER — Encounter: Payer: Self-pay | Admitting: Hematology

## 2021-03-28 ENCOUNTER — Other Ambulatory Visit: Payer: Medicare Other

## 2021-03-28 VITALS — BP 142/77 | HR 79 | Temp 97.7°F | Resp 16 | Wt 182.2 lb

## 2021-03-28 DIAGNOSIS — C49A0 Gastrointestinal stromal tumor, unspecified site: Secondary | ICD-10-CM

## 2021-03-28 DIAGNOSIS — Z79899 Other long term (current) drug therapy: Secondary | ICD-10-CM | POA: Diagnosis not present

## 2021-03-28 DIAGNOSIS — D72829 Elevated white blood cell count, unspecified: Secondary | ICD-10-CM | POA: Insufficient documentation

## 2021-03-28 DIAGNOSIS — E1149 Type 2 diabetes mellitus with other diabetic neurological complication: Secondary | ICD-10-CM

## 2021-03-28 DIAGNOSIS — D649 Anemia, unspecified: Secondary | ICD-10-CM | POA: Insufficient documentation

## 2021-03-28 DIAGNOSIS — N179 Acute kidney failure, unspecified: Secondary | ICD-10-CM | POA: Insufficient documentation

## 2021-03-28 DIAGNOSIS — T451X5A Adverse effect of antineoplastic and immunosuppressive drugs, initial encounter: Secondary | ICD-10-CM | POA: Insufficient documentation

## 2021-03-28 DIAGNOSIS — E7849 Other hyperlipidemia: Secondary | ICD-10-CM

## 2021-03-28 DIAGNOSIS — K521 Toxic gastroenteritis and colitis: Secondary | ICD-10-CM | POA: Insufficient documentation

## 2021-03-28 DIAGNOSIS — C49A2 Gastrointestinal stromal tumor of stomach: Secondary | ICD-10-CM | POA: Insufficient documentation

## 2021-03-28 DIAGNOSIS — R944 Abnormal results of kidney function studies: Secondary | ICD-10-CM | POA: Insufficient documentation

## 2021-03-28 LAB — CMP (CANCER CENTER ONLY)
ALT: 21 U/L (ref 0–44)
AST: 22 U/L (ref 15–41)
Albumin: 4.1 g/dL (ref 3.5–5.0)
Alkaline Phosphatase: 90 U/L (ref 38–126)
Anion gap: 5 (ref 5–15)
BUN: 12 mg/dL (ref 8–23)
CO2: 23 mmol/L (ref 22–32)
Calcium: 9.3 mg/dL (ref 8.9–10.3)
Chloride: 108 mmol/L (ref 98–111)
Creatinine: 1.23 mg/dL — ABNORMAL HIGH (ref 0.44–1.00)
GFR, Estimated: 46 mL/min — ABNORMAL LOW (ref >60)
Glucose, Bld: 95 mg/dL (ref 70–99)
Potassium: 4.5 mmol/L (ref 3.5–5.1)
Sodium: 136 mmol/L (ref 135–145)
Total Bilirubin: 0.6 mg/dL (ref 0.3–1.2)
Total Protein: 7.8 g/dL (ref 6.5–8.1)

## 2021-03-28 LAB — CBC WITH DIFFERENTIAL (CANCER CENTER ONLY)
Abs Immature Granulocytes: 0.03 10*3/uL (ref 0.00–0.07)
Basophils Absolute: 0 10*3/uL (ref 0.0–0.1)
Basophils Relative: 0 %
Eosinophils Absolute: 0.2 10*3/uL (ref 0.0–0.5)
Eosinophils Relative: 2 %
HCT: 34.2 % — ABNORMAL LOW (ref 36.0–46.0)
Hemoglobin: 11 g/dL — ABNORMAL LOW (ref 12.0–15.0)
Immature Granulocytes: 0 %
Lymphocytes Relative: 25 %
Lymphs Abs: 2.1 10*3/uL (ref 0.7–4.0)
MCH: 27.4 pg (ref 26.0–34.0)
MCHC: 32.2 g/dL (ref 30.0–36.0)
MCV: 85.3 fL (ref 80.0–100.0)
Monocytes Absolute: 0.6 10*3/uL (ref 0.1–1.0)
Monocytes Relative: 7 %
Neutro Abs: 5.4 10*3/uL (ref 1.7–7.7)
Neutrophils Relative %: 66 %
Platelet Count: 230 10*3/uL (ref 150–400)
RBC: 4.01 MIL/uL (ref 3.87–5.11)
RDW: 20.2 % — ABNORMAL HIGH (ref 11.5–15.5)
WBC Count: 8.3 10*3/uL (ref 4.0–10.5)
nRBC: 0 % (ref 0.0–0.2)

## 2021-03-28 LAB — TSH: TSH: 0.977 u[IU]/mL (ref 0.308–3.960)

## 2021-03-28 LAB — LIPID PANEL
Cholesterol: 116 mg/dL (ref 0–200)
HDL: 44 mg/dL (ref >40)
LDL Cholesterol: 59 mg/dL (ref 0–99)
Total CHOL/HDL Ratio: 2.6 RATIO
Triglycerides: 65 mg/dL (ref <150)
VLDL: 13 mg/dL (ref 0–40)

## 2021-03-28 LAB — HEMOGLOBIN A1C
Hgb A1c MFr Bld: 5.6 % (ref 4.8–5.6)
Mean Plasma Glucose: 114.02 mg/dL

## 2021-03-28 NOTE — Progress Notes (Signed)
?[place]   ?[phone] [phone]   ?Clinic Follow up Note  ? ?Patient Care Team: ?Ngetich, Nelda Bucks, NP as PCP - General (Family Medicine) ?Calton Dach, MD as Referring Physician (Optometry) ?Latanya Maudlin, MD as Consulting Physician (Orthopedic Surgery) ?Warden Fillers, MD as Consulting Physician (Ophthalmology) ?Ladene Artist, MD as Consulting Physician (Gastroenterology) ?Stoioff, Ronda Fairly, MD (Urology) ?Stechschulte, Nickola Major, MD as Consulting Physician (Surgery) ?Truitt Merle, MD as Consulting Physician (Hematology) ? ?Date of Service:  03/28/2021 ? ?CHIEF COMPLAINT: f/u of gastrointestinal stromal tumor ? ?CURRENT THERAPY:  ?[place], started 03/09/21 ? ?ASSESSMENT & PLAN:  ?Alison [name] is a 76 y.o. female with  ? ?1. 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 Work up showed acute anemia and large gastric mass on CT AP on 11/14/20. Gastric mass not evident on 12/03/20 endoscopy. She underwent partial gastrectomy 01/14/21 by Dr. 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 No lymph nodes were evaluated.  ?-she has high risk of recurrence (86%) due to the large size and high grade.  ?-staging chest CT on 02/17/21 showed overall no indicators for metastatic disease. ?-Due to her high recurrence risk, we are recommend adjuvant Imatinib (Gleevec) for 3 years to reduce that recurrence risk. We plan to obtain surveillance imaging q6 months for first 2-3 years. 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 She is tolerating well so far. ?-labs reviewed, hgb 11, plt now WNL, Cr slightly elevated at 1.23. I encouraged her to drink more water. She will continue Gleevec. ?  ?2. Diarrhea and AKI  ?-secondary to imatinib, baseline moderate diarrhea ?-Any slightly  elevated today, likely related to diarrhea. ?-I instructed her to increase oral intake, especially with sports drink ?-We will repeat lab next week for close monitoring ?  ?3. Anemia, leukocytosis ?-she developed mild anemia at PCP visit 09/2020, Hgb 11.3  ?-Hgb trended down to 9.3 on 11/18/20, lowest 6.8 01/2021 after surgery ?-she received 2 units pRBCs with surgery, no iron level done ?-iron panel on 02/10/21 showed low iron and elevated ferritin. ?-we previously discussed patients s/p gastric surgeries are at risk for B12 deficiency. ?  ?4. HTN, HL, DM, GERD, OA  ?-meds and f/up per PCP. 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  ?5. Social ?-Pt is single, lives with her daughter. Does not drive ?-Otherwise independent with ADLs.  ?-we can refer to SW for resources as needed ?  ?  ?PLAN: ?-Continue imatinib, reviewed diarrhea management ?-Lab next week for follow up of AKI  ?-lab and f/u in 3 weeks ? ? ?No problem-specific Assessment & Plan notes found for this encounter. ? ? ?INTERVAL HISTORY:  ?Alison [name] is here for a follow up of GIST. She was last seen by me on 02/10/21 in consultation with NP Lacie. She presents to the clinic accompanied by her sister. ?She reports she is doing well overall and denies side effects from [place]. ?  ?All other systems were reviewed with the patient and are negative. ? ?MEDICAL HISTORY:  ?Past Medical History:  ?Diagnosis Date  ? Anxiety   ? Benign essential hypertension   ? Blood transfusion without reported diagnosis   ? Cataract   ? removed bilateraly  ? Diabetes mellitus without complication ([place])   ? Diverticulitis of colon (without mention of  hemorrhage)(562.11)   ? GERD (gastroesophageal reflux disease)   ? Goiter, specified as simple   ? Helicobacter pylori gastritis   ? Hyperlipidemia LDL goal < 100   ? Intrahepatic bile duct dilation   ? Numbness   ? TOES RT FOOT and toes left foot  ? Obesity, unspecified   ? Osteoarthrosis, unspecified whether generalized or  localized, lower leg   ? Osteopenia   ? Other specified disease of nail   ? FUNGUS OF FINGERNAILS  ? Pernicious anemia   ? Unspecified glaucoma(365.9)   ? Unspecified vitamin D deficiency   ? Wears dentures   ? Wears glasses   ? ? ?SURGICAL HISTORY: ?Past Surgical History:  ?Procedure Laterality Date  ? BACK SURGERY    ? CATARACT EXTRACTION    ? right eye 06/20/15 left eye 09/12/15  ? COLECTOMY  2009  ? COLONOSCOPY  09/06/2007  ? Dr Carol Ada  ? DILATION AND CURETTAGE OF UTERUS    ? ENDOSCOPIC RETROGRADE CHOLANGIOPANCREATOGRAPHY (ERCP) WITH PROPOFOL N/A 03/17/2019  ? Procedure: ENDOSCOPIC RETROGRADE CHOLANGIOPANCREATOGRAPHY (ERCP) WITH PROPOFOL;  Surgeon: Ladene Artist, MD;  Location[facility];  Service: Endoscopy;  Laterality: N/A;  ? JOINT REPLACEMENT    ? right  ? LUMBAR LAMINECTOMY/DECOMPRESSION MICRODISCECTOMY N/A 03/30/2013  ? Procedure: CENTRAL DECOMPRESSION L4 - L5 AND EXCISION 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;  Surgeon: Tobi Bastos, MD;  Location: [facility] ORS;  Service: Orthopedics;  Laterality: N/A;  ? MULTIPLE TOOTH EXTRACTIONS    ? REMOVAL OF STONES  03/17/2019  ? Procedure: REMOVAL OF STONES;  Surgeon: Ladene Artist, MD;  Location[facility] END[facility]COPY;  Service: Endoscopy;;  ? SIGMOIDOSCOPY  02/16/2018  ? Dr Fuller Plan  ? SPHINCTEROTOMY  03/17/2019  ? Procedure: SPHINCTEROTOMY;  Surgeon: Ladene Artist, MD;  Location: [facility] ENDOSCOPY;  Service: Endoscopy;;  ? TOTAL KNEE ARTHROPLASTY Right 02/01/2013  ? Procedure: RIGHT TOTAL KNEE ARTHROPLASTY;  Surgeon: Tobi Bastos, MD;  Location: [facility]S;  Service: Orthopedics;  Laterality: Right;  ? TOTAL KNEE ARTHROPLASTY Left 04/18/2014  ? Procedure: LEFT TOTAL KNEE ARTHROPLASTY;  Surgeon: Latanya Maudlin, MD;  Location: [facility] ORS;  Service: Orthopedics;  Laterality: Left;  ? ? ?I have reviewed the social history and family history with the patient and they are unchanged from previous note. ? ?ALLERGIES:  is allergic to aspirin. ? ?MEDICATIONS:  ?Current Outpatient Medications   ?Medication Sig Dispense Refill  ? lisinopril (ZESTRIL) 30 MG tablet Take 1 tablet by mouth once daily 90 tablet 0  ? acetaminophen (TYLENOL) 650 MG CR tablet Take 1,300 mg by mouth in the morning and at bedtime.    ? amLODipine (NORVASC) 5 MG tablet Take 1 tablet by mouth once daily 90 tablet 1  ? aspirin EC 81 MG tablet Take 81 mg by mouth daily.    ? atorvastatin (LIPITOR) 40 MG tablet TAKE 1 TABLET BY MOUTH ONCE DAILY AT  6PM 90 tablet 1  ? Calcium Carb-Cholecalciferol (CALCIUM 600 + D PO) Take 1 tablet by mouth in the morning.    ? carboxymethylcellulose (REFRESH PLUS) 0.5 % SOLN Place 1 drop into both eyes 3 (three) times daily as needed (dry eyes).     ? celecoxib (CELEBREX) 200 MG capsule Take 200 mg by mouth in the morning.    ? Cholecalciferol (VITAMIN D-3) 1000 UNITS CAPS Take 1,000 Units by mouth in the morning.    ? dorzolamide-timolol (COSOPT) 22.3-6.8 MG/ML ophthalmic solution Place 1 drop into both eyes 2 (two) times  daily.    ? gabapentin (NEURONTIN) 300 MG capsule Take 1 capsule by mouth at bedtime 90 capsule 1  ? Homeopathic Products (THERAWORX RELIEF EX) Apply 1 application topically daily as needed (pain).    ? imatinib (GLEEVEC) 400 MG tablet Take 1 tablet (400 mg total) by mouth daily. Take with meals and large glass of water.Caution:Chemotherapy. 30 tablet 3  ? latanoprost (XALATAN) 0.005 % ophthalmic solution Place 1 drop into both eyes at bedtime.    ? Lidocaine HCl 4 % CREA Apply 1 application topically 2 (two) times daily as needed (knee pain.).    ? metFORMIN (GLUCOPHAGE) 500 MG tablet TAKE 1/2 (ONE-HALF) TABLET BY MOUTH IN THE EVENING WITH SUPPER 45 tablet 2  ? Multiple Vitamin (MULTIVITAMIN WITH MINERALS) TABS tablet Take 1 tablet by mouth daily.    ? neomycin-bacitracin-polymyxin (NEOSPORIN) ointment Apply 1 application topically as needed for wound care.    ? nystatin cream (MYCOSTATIN) Apply 1 application topically 2 (two) times daily. 30 g 1  ? Omega-3 Fatty Acids (OMEGA 3 PO)  Take 1,200 mg by mouth in the morning.    ? pantoprazole (PROTONIX) 40 MG tablet Take 1 tablet (40 mg total) by mouth daily. 30 tablet 11  ? Zinc Oxide (DESITIN EX) Apply 1 application topically as needed.    ? ?No

## 2021-03-31 ENCOUNTER — Telehealth: Payer: Self-pay | Admitting: Hematology

## 2021-03-31 NOTE — Telephone Encounter (Signed)
Left message with follow-up appointment per 3/17 los. ?

## 2021-04-01 ENCOUNTER — Inpatient Hospital Stay: Payer: Medicare Other

## 2021-04-01 ENCOUNTER — Other Ambulatory Visit: Payer: Self-pay

## 2021-04-01 ENCOUNTER — Encounter: Payer: Self-pay | Admitting: Family

## 2021-04-01 ENCOUNTER — Ambulatory Visit (INDEPENDENT_AMBULATORY_CARE_PROVIDER_SITE_OTHER): Payer: Medicare Other | Admitting: Family

## 2021-04-01 VITALS — BP 118/68 | HR 67 | Temp 97.7°F | Resp 16 | Ht 65.0 in | Wt 186.0 lb

## 2021-04-01 DIAGNOSIS — I700 Atherosclerosis of aorta: Secondary | ICD-10-CM

## 2021-04-01 DIAGNOSIS — D649 Anemia, unspecified: Secondary | ICD-10-CM | POA: Diagnosis not present

## 2021-04-01 DIAGNOSIS — G8929 Other chronic pain: Secondary | ICD-10-CM

## 2021-04-01 DIAGNOSIS — E1169 Type 2 diabetes mellitus with other specified complication: Secondary | ICD-10-CM

## 2021-04-01 DIAGNOSIS — E559 Vitamin D deficiency, unspecified: Secondary | ICD-10-CM

## 2021-04-01 DIAGNOSIS — T451X5A Adverse effect of antineoplastic and immunosuppressive drugs, initial encounter: Secondary | ICD-10-CM | POA: Diagnosis not present

## 2021-04-01 DIAGNOSIS — M25562 Pain in left knee: Secondary | ICD-10-CM

## 2021-04-01 DIAGNOSIS — E785 Hyperlipidemia, unspecified: Secondary | ICD-10-CM

## 2021-04-01 DIAGNOSIS — E1149 Type 2 diabetes mellitus with other diabetic neurological complication: Secondary | ICD-10-CM

## 2021-04-01 DIAGNOSIS — K521 Toxic gastroenteritis and colitis: Secondary | ICD-10-CM | POA: Diagnosis not present

## 2021-04-01 DIAGNOSIS — K219 Gastro-esophageal reflux disease without esophagitis: Secondary | ICD-10-CM | POA: Diagnosis not present

## 2021-04-01 DIAGNOSIS — C49A2 Gastrointestinal stromal tumor of stomach: Secondary | ICD-10-CM | POA: Diagnosis not present

## 2021-04-01 DIAGNOSIS — R944 Abnormal results of kidney function studies: Secondary | ICD-10-CM | POA: Diagnosis not present

## 2021-04-01 DIAGNOSIS — L989 Disorder of the skin and subcutaneous tissue, unspecified: Secondary | ICD-10-CM | POA: Diagnosis not present

## 2021-04-01 DIAGNOSIS — M25561 Pain in right knee: Secondary | ICD-10-CM | POA: Diagnosis not present

## 2021-04-01 DIAGNOSIS — C49A0 Gastrointestinal stromal tumor, unspecified site: Secondary | ICD-10-CM

## 2021-04-01 DIAGNOSIS — I10 Essential (primary) hypertension: Secondary | ICD-10-CM | POA: Diagnosis not present

## 2021-04-01 DIAGNOSIS — D72829 Elevated white blood cell count, unspecified: Secondary | ICD-10-CM | POA: Diagnosis not present

## 2021-04-01 LAB — CBC WITH DIFFERENTIAL (CANCER CENTER ONLY)
Abs Immature Granulocytes: 0.02 10*3/uL (ref 0.00–0.07)
Basophils Absolute: 0 10*3/uL (ref 0.0–0.1)
Basophils Relative: 0 %
Eosinophils Absolute: 0.2 10*3/uL (ref 0.0–0.5)
Eosinophils Relative: 3 %
HCT: 30.2 % — ABNORMAL LOW (ref 36.0–46.0)
Hemoglobin: 9.6 g/dL — ABNORMAL LOW (ref 12.0–15.0)
Immature Granulocytes: 0 %
Lymphocytes Relative: 31 %
Lymphs Abs: 2.6 10*3/uL (ref 0.7–4.0)
MCH: 27 pg (ref 26.0–34.0)
MCHC: 31.8 g/dL (ref 30.0–36.0)
MCV: 85.1 fL (ref 80.0–100.0)
Monocytes Absolute: 0.7 10*3/uL (ref 0.1–1.0)
Monocytes Relative: 8 %
Neutro Abs: 4.8 10*3/uL (ref 1.7–7.7)
Neutrophils Relative %: 58 %
Platelet Count: 260 10*3/uL (ref 150–400)
RBC: 3.55 MIL/uL — ABNORMAL LOW (ref 3.87–5.11)
RDW: 19.9 % — ABNORMAL HIGH (ref 11.5–15.5)
WBC Count: 8.3 10*3/uL (ref 4.0–10.5)
nRBC: 0 % (ref 0.0–0.2)

## 2021-04-01 LAB — CMP (CANCER CENTER ONLY)
ALT: 17 U/L (ref 0–44)
AST: 22 U/L (ref 15–41)
Albumin: 3.9 g/dL (ref 3.5–5.0)
Alkaline Phosphatase: 91 U/L (ref 38–126)
Anion gap: 5 (ref 5–15)
BUN: 10 mg/dL (ref 8–23)
CO2: 24 mmol/L (ref 22–32)
Calcium: 8.8 mg/dL — ABNORMAL LOW (ref 8.9–10.3)
Chloride: 103 mmol/L (ref 98–111)
Creatinine: 1.18 mg/dL — ABNORMAL HIGH (ref 0.44–1.00)
GFR, Estimated: 48 mL/min — ABNORMAL LOW (ref >60)
Glucose, Bld: 145 mg/dL — ABNORMAL HIGH (ref 70–99)
Potassium: 3.9 mmol/L (ref 3.5–5.1)
Sodium: 132 mmol/L — ABNORMAL LOW (ref 135–145)
Total Bilirubin: 0.5 mg/dL (ref 0.3–1.2)
Total Protein: 7.3 g/dL (ref 6.5–8.1)

## 2021-04-01 NOTE — Progress Notes (Signed)
? ?Provider: Marlowe Sax FNP-C  ? ?Amaro Mangold, Nelda Bucks, NP ? ?Patient Care Team: ?Adnan Vanvoorhis, Nelda Bucks, NP as PCP - General (Family Medicine) ?Calton Dach, MD as Referring Physician (Optometry) ?Latanya Maudlin, MD as Consulting Physician (Orthopedic Surgery) ?Warden Fillers, MD as Consulting Physician (Ophthalmology) ?Ladene Artist, MD as Consulting Physician (Gastroenterology) ?Stoioff, Ronda Fairly, MD (Urology) ?Stechschulte, Nickola Major, MD as Consulting Physician (Surgery) ?Truitt Merle, MD as Consulting Physician (Hematology) ? ?Extended Emergency Contact Information ?Primary Emergency Contact: Hallmark,[name] ?Address: [address] ?         [place], [place] [address] [place] ?Home Phone: [phone] ?Mobile Phone: [phone] ?Relation: Daughter ?Secondary Emergency Contact: Bowman,Lillie ?Address: [address] ?         [place], [place] [address] [place] ?Home Phone: [phone] ?Mobile Phone: [phone] ?Relation: Sister ? ?Code Status:  Full Code  ?Goals of care: Advanced Directive information ?Advanced Directives 04/01/2021  ?Does Patient Have a Medical Advance Directive? No  ?Type of Advance Directive -  ?Does patient want to make changes to medical advance directive? -  ?Would patient like information on creating a medical advance directive? No - Patient declined  ?Pre-existing out of facility DNR order (yellow form or pink MOST form) -  ? ? ? ?Chief Complaint  ?Patient presents with  ? Medical Management of Chronic Issues  ?  6 month follow up/ Discuss recent lab work.   ? ? ?HPI:  ?Pt is a 76 y.o. female seen today for 4-monthfollow-up for medical management of chronic diseases.  Has some medical history of type 2 diabetes with neuropathy, essential hypertension hyperlipidemia, GERD, osteoarthritis of both knees, chronic low back pain, obesity among others. ? ?Her recent lab work done 03/28/2021 reviewed and discussed during visit. ?Hemoglobin A1c still slightly high 5.6  compared to previous level 5.5; 5.8 but well controlled. ? ?Cholesterol levels are well controlled.  Total cholesterol was 116, triglycerides 65 and LDL 59 ? ?Creatinine was slightly elevated 1.23 ? ?Previous low hemoglobin has improved at 11.0 previous was 9.4. ? ?Continues to follow up with Oncologist.currently on Gleevec 400 mg tablet daily.States gives her diarrhea.she was advised to take Imodium.  ? ?Past Medical History:  ?Diagnosis Date  ? Anxiety   ? Benign essential hypertension   ? Blood transfusion without reported diagnosis   ? Cataract   ? removed bilateraly  ? Diabetes mellitus [place] complication ([place]   ? Diverticulitis of colon (without mention of hemorrhage)(562.11)   ? GERD (gastroesophageal reflux disease)   ? Goiter, specified as simple   ? Helicobacter pylori gastritis   ? Hyperlipidemia LDL goal < 100   ? Intrahepatic bile duct dilation   ? Numbness   ? TOES RT FOOT and toes left foot  ? Obesity, unspecified   ? Osteoarthrosis, unspecified whether generalized or localized, lower leg   ? Osteopenia   ? Other specified disease of nail   ? FUNGUS OF FINGERNAILS  ? Pernicious anemia   ? Unspecified glaucoma(365.9)   ? Unspecified vitamin D deficiency   ? Wears dentures   ? Wears glasses   ? ?Past Surgical History:  ?Procedure Laterality Date  ? BACK SURGERY    ? CATARACT EXTRACTION    ? right eye 06/20/15 left eye 09/12/15  ? COLECTOMY  2009  ? COLONOSCOPY  09/06/2007  ? Dr PCarol Ada ? DILATION AND CURETTAGE OF UTERUS    ? ENDOSCOPIC RETROGRADE CHOLANGIOPANCREATOGRAPHY (ERCP) WITH PROPOFOL N/A 03/17/2019  ?  Procedure: ENDOSCOPIC RETROGRADE CHOLANGIOPANCREATOGRAPHY (ERCP) WITH PROPOFOL;  Surgeon: Ladene Artist, MD;  Location: [facility] ENDOSCOPY;  Service: Endoscopy;  Laterality: N/A;  ? JOINT REPLACEMENT    ? right  ? LUMBAR LAMINECTOMY/DECOMPRESSION MICRODISCECTOMY N/A 03/30/2013  ? Procedure: CENTRAL DECOMPRESSION L4 - L5 AND EXCISION 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;  Surgeon: Tobi Bastos, MD;   Location: [facility];  Service: Orthopedics;  Laterality: N/A;  ? MULTIPLE TOOTH EXTRACTIONS    ? REMOVAL OF STONES  03/17/2019  ? Procedure: REMOVAL OF STONES;  Surgeon: Ladene Artist, MD;  Location: [facility] ENDOSCOPY;  Service: Endoscopy;;  ? SIGMOIDOSCOPY  02/16/2018  ? Dr Fuller Plan  ? SPHINCTEROTOMY  03/17/2019  ? Procedure: SPHINCTEROTOMY;  Surgeon: Ladene Artist, MD;  Location: [facility] ENDOSCOPY;  Service: Endoscopy;;  ? TOTAL KNEE ARTHROPLASTY Right 02/01/2013  ? Procedure: RIGHT TOTAL KNEE ARTHROPLASTY;  Surgeon: Tobi Bastos, MD;  Location: [facility];  Service: Orthopedics;  Laterality: Right;  ? TOTAL KNEE ARTHROPLASTY Left 04/18/2014  ? Procedure: LEFT TOTAL KNEE ARTHROPLASTY;  Surgeon: Latanya Maudlin, MD;  Location: [facility];  Service: Orthopedics;  Laterality: Left;  ? ? ?Allergies  ?Allergen Reactions  ? Aspirin Nausea Only and Nausea And Vomiting  ?  Can take 81 mg but can't take '325mg'$ , hard on stomach. ?Can take 81 mg but can't take '325mg'$ , hard on stomach.  ? ? ?Allergies as of 04/01/2021   ? ?   Reactions  ? Aspirin Nausea Only, Nausea And Vomiting  ? Can take 81 mg but can't take '325mg'$ , hard on stomach. ?Can take 81 mg but can't take '325mg'$ , hard on stomach.  ? ?  ? ?  ?Medication List  ?  ? ?  ? Accurate as of April 01, 2021  3:10 PM. If you have any questions, ask your nurse or doctor.  ?  ?  ? ?  ? ?acetaminophen 650 MG CR tablet ?Commonly known as: TYLENOL ?Take 1,300 mg by mouth in the morning and at bedtime. ?  ?amLODipine 5 MG tablet ?Commonly known as: NORVASC ?Take 1 tablet by mouth once daily ?  ?aspirin EC 81 MG tablet ?Take 81 mg by mouth daily. ?  ?atorvastatin 40 MG tablet ?Commonly known as: LIPITOR ?TAKE 1 TABLET BY MOUTH ONCE DAILY AT  6PM ?  ?CALCIUM 600 + D PO ?Take 1 tablet by mouth in the morning. ?  ?carboxymethylcellulose 0.5 % Soln ?Commonly known as: REFRESH PLUS ?Place 1 drop into both eyes 3 (three) times daily as needed (dry eyes). ?  ?celecoxib 200 MG capsule ?Commonly known as:  CELEBREX ?Take 200 mg by mouth in the morning. ?  ?DESITIN EX ?Apply 1 application topically as needed. ?  ?dorzolamide-timolol 22.3-6.8 MG/ML ophthalmic solution ?Commonly known as: COSOPT ?Place 1 drop into both eyes 2 (two) times daily. ?  ?gabapentin 300 MG capsule ?Commonly known as: NEURONTIN ?Take 1 capsule by mouth at bedtime ?  ?imatinib 400 MG tablet ?Commonly known as: GLEEVEC ?Take 1 tablet (400 mg total) by mouth daily. Take with meals and large glass of water.Caution:Chemotherapy. ?  ?latanoprost 0.005 % ophthalmic solution ?Commonly known as: XALATAN ?Place 1 drop into both eyes at bedtime. ?  ?Lidocaine HCl 4 % Crea ?Apply 1 application topically 2 (two) times daily as needed (knee pain.). ?  ?lisinopril 30 MG tablet ?Commonly known as: ZESTRIL ?Take 1 tablet by mouth once daily ?  ?metFORMIN 500 MG tablet ?Commonly known as: GLUCOPHAGE ?TAKE 1/2 (ONE-HALF) TABLET BY  MOUTH IN THE EVENING WITH SUPPER ?  ?multivitamin with minerals Tabs tablet ?Take 1 tablet by mouth daily. ?  ?neomycin-bacitracin-polymyxin ointment ?Commonly known as: NEOSPORIN ?Apply 1 application topically as needed for wound care. ?  ?nystatin cream ?Commonly known as: MYCOSTATIN ?Apply 1 application topically 2 (two) times daily. ?  ?OMEGA 3 PO ?Take 1,200 mg by mouth in the morning. ?  ?pantoprazole 40 MG tablet ?Commonly known as: PROTONIX ?Take 1 tablet (40 mg total) by mouth daily. ?  ?[place] ?Apply 1 application topically daily as needed (pain). ?  ?Vitamin D-3 25 MCG (1000 UT) Caps ?Take 1,000 Units by mouth in the morning. ?  ? ?  ? ? ?Review of Systems  ?Constitutional:  Negative for appetite change, chills, fatigue, fever and unexpected weight change.  ?HENT:  Negative for congestion, dental problem, ear discharge, ear pain, facial swelling, hearing loss, nosebleeds, postnasal drip, rhinorrhea, sinus pressure, sinus pain, sneezing, sore throat, tinnitus and trouble swallowing.   ?Eyes:  Positive for visual  disturbance. Negative for pain, discharge, redness and itching.  ?     Eye glasses  ?Respiratory:  Negative for cough, chest tightness, shortness of breath and wheezing.   ?Cardiovascular:  Negative for chest pain, palp

## 2021-04-14 DIAGNOSIS — L821 Other seborrheic keratosis: Secondary | ICD-10-CM | POA: Diagnosis not present

## 2021-04-18 ENCOUNTER — Inpatient Hospital Stay: Payer: Medicare Other | Attending: Hematology

## 2021-04-18 ENCOUNTER — Encounter: Payer: Self-pay | Admitting: Hematology

## 2021-04-18 ENCOUNTER — Inpatient Hospital Stay (HOSPITAL_BASED_OUTPATIENT_CLINIC_OR_DEPARTMENT_OTHER): Payer: Medicare Other | Admitting: Hematology

## 2021-04-18 ENCOUNTER — Other Ambulatory Visit: Payer: Self-pay

## 2021-04-18 VITALS — BP 129/86 | HR 73 | Temp 97.5°F | Resp 17 | Wt 189.5 lb

## 2021-04-18 DIAGNOSIS — C49A0 Gastrointestinal stromal tumor, unspecified site: Secondary | ICD-10-CM | POA: Diagnosis not present

## 2021-04-18 DIAGNOSIS — C49A2 Gastrointestinal stromal tumor of stomach: Secondary | ICD-10-CM | POA: Diagnosis not present

## 2021-04-18 DIAGNOSIS — R197 Diarrhea, unspecified: Secondary | ICD-10-CM | POA: Diagnosis not present

## 2021-04-18 DIAGNOSIS — D72829 Elevated white blood cell count, unspecified: Secondary | ICD-10-CM | POA: Diagnosis not present

## 2021-04-18 DIAGNOSIS — Z79899 Other long term (current) drug therapy: Secondary | ICD-10-CM | POA: Insufficient documentation

## 2021-04-18 LAB — CBC WITH DIFFERENTIAL (CANCER CENTER ONLY)
Abs Immature Granulocytes: 0.02 10*3/uL (ref 0.00–0.07)
Basophils Absolute: 0 10*3/uL (ref 0.0–0.1)
Basophils Relative: 0 %
Eosinophils Absolute: 0.2 10*3/uL (ref 0.0–0.5)
Eosinophils Relative: 2 %
HCT: 30.4 % — ABNORMAL LOW (ref 36.0–46.0)
Hemoglobin: 9.6 g/dL — ABNORMAL LOW (ref 12.0–15.0)
Immature Granulocytes: 0 %
Lymphocytes Relative: 23 %
Lymphs Abs: 1.9 10*3/uL (ref 0.7–4.0)
MCH: 27.8 pg (ref 26.0–34.0)
MCHC: 31.6 g/dL (ref 30.0–36.0)
MCV: 88.1 fL (ref 80.0–100.0)
Monocytes Absolute: 0.6 10*3/uL (ref 0.1–1.0)
Monocytes Relative: 7 %
Neutro Abs: 5.5 10*3/uL (ref 1.7–7.7)
Neutrophils Relative %: 68 %
Platelet Count: 272 10*3/uL (ref 150–400)
RBC: 3.45 MIL/uL — ABNORMAL LOW (ref 3.87–5.11)
RDW: 20.5 % — ABNORMAL HIGH (ref 11.5–15.5)
WBC Count: 8.2 10*3/uL (ref 4.0–10.5)
nRBC: 0 % (ref 0.0–0.2)

## 2021-04-18 LAB — FERRITIN: Ferritin: 208 ng/mL (ref 11–307)

## 2021-04-18 LAB — CMP (CANCER CENTER ONLY)
ALT: 16 U/L (ref 0–44)
AST: 21 U/L (ref 15–41)
Albumin: 3.6 g/dL (ref 3.5–5.0)
Alkaline Phosphatase: 67 U/L (ref 38–126)
Anion gap: 2 — ABNORMAL LOW (ref 5–15)
BUN: 10 mg/dL (ref 8–23)
CO2: 26 mmol/L (ref 22–32)
Calcium: 8.6 mg/dL — ABNORMAL LOW (ref 8.9–10.3)
Chloride: 107 mmol/L (ref 98–111)
Creatinine: 1.06 mg/dL — ABNORMAL HIGH (ref 0.44–1.00)
GFR, Estimated: 54 mL/min — ABNORMAL LOW (ref >60)
Glucose, Bld: 109 mg/dL — ABNORMAL HIGH (ref 70–99)
Potassium: 4.2 mmol/L (ref 3.5–5.1)
Sodium: 135 mmol/L (ref 135–145)
Total Bilirubin: 0.6 mg/dL (ref 0.3–1.2)
Total Protein: 6.9 g/dL (ref 6.5–8.1)

## 2021-04-18 LAB — IRON AND IRON BINDING CAPACITY (CC-WL,HP ONLY)
Iron: 63 ug/dL (ref 28–170)
Saturation Ratios: 24 % (ref 10.4–31.8)
TIBC: 259 ug/dL (ref 250–450)
UIBC: 196 ug/dL (ref 148–442)

## 2021-04-18 NOTE — Progress Notes (Signed)
?[place]   ?[phone] [phone]   ?Clinic Follow up Note  ? ?Patient Care Team: ?Ngetich, Nelda Bucks, NP as PCP - General (Family Medicine) ?Calton Dach, MD as Referring Physician (Optometry) ?Latanya Maudlin, MD as Consulting Physician (Orthopedic Surgery) ?Warden Fillers, MD as Consulting Physician (Ophthalmology) ?Ladene Artist, MD as Consulting Physician (Gastroenterology) ?Stoioff, Ronda Fairly, MD (Urology) ?Stechschulte, Nickola Major, MD as Consulting Physician (Surgery) ?Truitt Merle, MD as Consulting Physician (Hematology) ? ?Date of Service:  04/18/2021 ? ?CHIEF COMPLAINT: f/u of GIST ? ?CURRENT THERAPY:  ?[place], started 03/09/21 ? ?ASSESSMENT & PLAN:  ?Alison [name] is a 76 y.o. female with  ? ?1. 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 Work up showed acute anemia and large gastric mass on CT AP on 11/14/20. Gastric mass not evident on 12/03/20 endoscopy. She underwent partial gastrectomy 01/14/21 by Dr. 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 No lymph nodes were evaluated.  ?-she has high risk of recurrence (86%) due to the large size and high grade.  ?-staging chest CT on 02/17/21 showed overall no indicators for metastatic disease. ?-Due to her high recurrence risk, we are recommend adjuvant Imatinib (Gleevec) for 3 years to reduce that recurrence risk. We plan to obtain surveillance imaging q6 months for first 2-3 years. 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 She is tolerating well so far with mild to moderate diarrhea, improved with Imodium. ?-labs reviewed, hgb 9.6, creatinine improved. She will continue Gleevec at full dose. ?  ?2. Diarrhea and AKI  ?-secondary to imatinib, baseline moderate diarrhea ?-diarrhea  improving ?-creatinine improved to 1.06 today (04/18/21), I again encouraged her to drink more water. ?  ?3. Anemia, leukocytosis ?-she developed mild anemia at PCP visit 09/2020, Hgb 11.3  ?-Hgb trended down to 9.3 on 11/18/20, lowest 6.8 01/2021 after surgery. She received 2 units pRBCs with surgery ?-iron panel on 02/10/21 showed low iron and elevated ferritin. ?-we previously discussed patients s/p gastric surgeries are at risk for B12 deficiency. ?  ?4. HTN, HL, DM, GERD, OA  ?-meds and f/up per PCP. 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  ?5. Social ?-Pt is single, lives with her daughter. Does not drive ?-Otherwise independent with ADLs.  ?-we can refer to SW for resources as needed ?  ?  ?PLAN: ?-Continue imatinib ?-lab and f/u in 2 months ? ? ?No problem-specific Assessment & Plan notes found for this encounter. ? ? ?INTERVAL HISTORY:  ?Alison [name] is here for a follow up of GIST. She was last seen by me on 03/28/21. She presents to the clinic accompanied by her daughter. ?She reports she is doing well overall, notes her diarrhea is improved and she has increased her liquid intake. ?  ?All other systems were reviewed with the patient and are negative. ? ?MEDICAL HISTORY:  ?Past Medical History:  ?Diagnosis Date  ? Anxiety   ? Benign essential hypertension   ? Blood transfusion without reported diagnosis   ? Cataract   ? removed bilateraly  ? Diabetes mellitus without complication ([place])   ? Diverticulitis of colon (without mention of hemorrhage)(562.11)   ? GERD (gastroesophageal reflux disease)   ? Goiter, specified as simple   ? Helicobacter pylori gastritis   ? Hyperlipidemia LDL goal <  100   ? Intrahepatic bile duct dilation   ? Numbness   ? TOES RT FOOT and toes left foot  ? Obesity, unspecified   ? Osteoarthrosis, unspecified whether generalized or localized, lower leg   ? Osteopenia   ? Other specified disease of nail   ? FUNGUS OF FINGERNAILS  ? Pernicious anemia   ? Unspecified glaucoma(365.9)    ? Unspecified vitamin D deficiency   ? Wears dentures   ? Wears glasses   ? ? ?SURGICAL HISTORY: ?Past Surgical History:  ?Procedure Laterality Date  ? BACK SURGERY    ? CATARACT EXTRACTION    ? right eye 06/20/15 left eye 09/12/15  ? COLECTOMY  2009  ? COLONOSCOPY  09/06/2007  ? Dr Carol Ada  ? DILATION AND CURETTAGE OF UTERUS    ? ENDOSCOPIC RETROGRADE CHOLANGIOPANCREATOGRAPHY (ERCP) WITH PROPOFOL N/A 03/17/2019  ? Procedure: ENDOSCOPIC RETROGRADE CHOLANGIOPANCREATOGRAPHY (ERCP) WITH PROPOFOL;  Surgeon: Ladene Artist, MD;  Location: [facility] ENDOSCOPY;  Service: Endoscopy;  Laterality: N/A;  ? JOINT REPLACEMENT    ? right  ? LUMBAR LAMINECTOMY/DECOMPRESSION MICRODISCECTOMY N/A 03/30/2013  ? Procedure: CENTRAL DECOMPRESSION L4 - L5 AND EXCISION 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;  Surgeon: Tobi Bastos, MD;  Location: [facility] ORS;  Service: Orthopedics;  Laterality: N/A;  ? MULTIPLE TOOTH EXTRACTIONS    ? REMOVAL OF STONES  03/17/2019  ? Procedure: REMOVAL OF STONES;  Surgeon: Ladene Artist, MD;  Location: [facility] ENDOSCOPY;  Service: Endoscopy;;  ? SIGMOIDOSCOPY  02/16/2018  ? Dr Fuller Plan  ? SPHINCTEROTOMY  03/17/2019  ? Procedure: SPHINCTEROTOMY;  Surgeon: Ladene Artist, MD;  Location: [facility] ENDOSCOPY;  Service: Endoscopy;;  ? TOTAL KNEE ARTHROPLASTY Right 02/01/2013  ? Procedure: RIGHT TOTAL KNEE ARTHROPLASTY;  Surgeon: Tobi Bastos, MD;  Location: [facility] ORS;  Service: Orthopedics;  Laterality: Right;  ? TOTAL KNEE ARTHROPLASTY Left 04/18/2014  ? Procedure: LEFT TOTAL KNEE ARTHROPLASTY;  Surgeon: Latanya Maudlin, MD;  Location: [facility] ORS;  Service: Orthopedics;  Laterality: Left;  ? ? ?I have reviewed the social history and family history with the patient and they are unchanged from previous note. ? ?ALLERGIES:  is allergic to aspirin. ? ?MEDICATIONS:  ?Current Outpatient Medications  ?Medication Sig Dispense Refill  ? acetaminophen (TYLENOL) 650 MG CR tablet Take 1,300 mg by mouth in the morning and at bedtime.    ? amLODipine (NORVASC)  5 MG tablet Take 1 tablet by mouth once daily 90 tablet 1  ? aspirin EC 81 MG tablet Take 81 mg by mouth daily.    ? atorvastatin (LIPITOR) 40 MG tablet TAKE 1 TABLET BY MOUTH ONCE DAILY AT  6PM 90 tablet 1  ? Calcium Carb-Cholecalciferol (CALCIUM 600 + D PO) Take 1 tablet by mouth in the morning.    ? carboxymethylcellulose (REFRESH PLUS) 0.5 % SOLN Place 1 drop into both eyes 3 (three) times daily as needed (dry eyes).     ? celecoxib (CELEBREX) 200 MG capsule Take 200 mg by mouth in the morning.    ? Cholecalciferol (VITAMIN D-3) 1000 UNITS CAPS Take 1,000 Units by mouth in the morning.    ? dorzolamide-timolol (COSOPT) 22.3-6.8 MG/ML ophthalmic solution Place 1 drop into both eyes 2 (two) times daily.    ? gabapentin (NEURONTIN) 300 MG capsule Take 1 capsule by mouth at bedtime 90 capsule 1  ? Homeopathic Products (THERAWORX RELIEF EX) Apply 1 application topically daily as needed (pain).    ? imatinib (GLEEVEC) 400 MG tablet Take  1 tablet (400 mg total) by mouth daily. Take with meals and large glass of water.Caution:Chemotherapy. 30 tablet 3  ? latanoprost (XALATAN) 0.005 % ophthalmic solution Place 1 drop into both eyes at bedtime.    ? Lidocaine HCl 4 % CREA Apply 1 application topically 2 (two) times daily as needed (knee pain.).    ? lisinopril (ZESTRIL) 30 MG tablet Take 1 tablet by mouth once daily 90 tablet 0  ? metFORMIN (GLUCOPHAGE) 500 MG tablet TAKE 1/2 (ONE-HALF) TABLET BY MOUTH IN THE EVENING WITH SUPPER 45 tablet 2  ? Multiple Vitamin (MULTIVITAMIN WITH MINERALS) TABS tablet Take 1 tablet by mouth daily.    ? neomycin-bacitracin-polymyxin (NEOSPORIN) ointment Apply 1 application topically as needed for wound care.    ? nystatin cream (MYCOSTATIN) Apply 1 application topically 2 (two) times daily. 30 g 1  ? Omega-3 Fatty Acids (OMEGA 3 PO) Take 1,200 mg by mouth in the morning.    ? pantoprazole (PROTONIX) 40 MG tablet Take 1 tablet (40 mg total) by mouth daily. 30 tablet 11  ? Zinc Oxide (DESITIN  EX) Apply 1 application topically as needed.    ? ?No current facility-administered medications for this visit.  ? ? ?PHYSICAL EXAMINATION: ?ECOG PERFORMANCE STATUS: 1 - Symptomatic but completely ambulatory

## 2021-05-06 ENCOUNTER — Other Ambulatory Visit: Payer: Self-pay

## 2021-05-06 DIAGNOSIS — C49A0 Gastrointestinal stromal tumor, unspecified site: Secondary | ICD-10-CM

## 2021-05-06 MED ORDER — IMATINIB MESYLATE 400 MG PO TABS: 400.0000 mg | ORAL_TABLET | Freq: Every day | ORAL | 3 refills | Status: DC

## 2021-05-06 MED ORDER — ONDANSETRON HCL 8 MG PO TABS: 8.0000 mg | ORAL_TABLET | Freq: Three times a day (TID) | ORAL | 2 refills | Status: DC | PRN

## 2021-05-12 ENCOUNTER — Telehealth: Payer: Self-pay

## 2021-05-12 NOTE — Telephone Encounter (Signed)
This nurse spoke with patient who stated that she is having stomach pains off and on for the last 2-3 days. She would like to know should she make an appointment at [facility] or should she call the surgeon.    She is not sure if it is related to the hernia that was found in January while having surgery.  Patient denies any problems with bowel movements, states that her appetite has decreased some and she has been having nausea over the last week and was prescribed Zofran.  Patient is not taking anything for pain.  This nurse advised that she will give the information to the provider for advisement.   ?

## 2021-05-12 NOTE — Telephone Encounter (Signed)
This nurse spoke with patient and advised per provider to hold off on taking Gleevac for right now and we will setup an office visit and lab appointment preferably next week to see the provider.  Patient acknowledged understanding.  No further questions or concerns at this time.   ?

## 2021-05-13 ENCOUNTER — Telehealth: Payer: Self-pay | Admitting: Hematology

## 2021-05-13 NOTE — Telephone Encounter (Signed)
Scheduled follow-up appointment per 5/2 patient call message. Patient is aware. ?

## 2021-05-21 ENCOUNTER — Encounter: Payer: Self-pay | Admitting: Podiatry

## 2021-05-21 ENCOUNTER — Ambulatory Visit (INDEPENDENT_AMBULATORY_CARE_PROVIDER_SITE_OTHER): Payer: Medicare Other | Admitting: Podiatry

## 2021-05-21 DIAGNOSIS — M79675 Pain in left toe(s): Secondary | ICD-10-CM

## 2021-05-21 DIAGNOSIS — B351 Tinea unguium: Secondary | ICD-10-CM

## 2021-05-21 DIAGNOSIS — M79674 Pain in right toe(s): Secondary | ICD-10-CM | POA: Diagnosis not present

## 2021-05-21 DIAGNOSIS — L84 Corns and callosities: Secondary | ICD-10-CM | POA: Diagnosis not present

## 2021-05-21 DIAGNOSIS — E1142 Type 2 diabetes mellitus with diabetic polyneuropathy: Secondary | ICD-10-CM | POA: Diagnosis not present

## 2021-05-22 ENCOUNTER — Inpatient Hospital Stay: Payer: Medicare Other | Attending: Hematology | Admitting: Hematology

## 2021-05-22 ENCOUNTER — Encounter: Payer: Self-pay | Admitting: Hematology

## 2021-05-22 ENCOUNTER — Other Ambulatory Visit: Payer: Self-pay

## 2021-05-22 ENCOUNTER — Inpatient Hospital Stay: Payer: Medicare Other

## 2021-05-22 VITALS — BP 152/81 | HR 75 | Temp 97.7°F | Resp 18 | Ht 65.0 in | Wt 181.5 lb

## 2021-05-22 DIAGNOSIS — Z79899 Other long term (current) drug therapy: Secondary | ICD-10-CM | POA: Diagnosis not present

## 2021-05-22 DIAGNOSIS — C49A2 Gastrointestinal stromal tumor of stomach: Secondary | ICD-10-CM | POA: Insufficient documentation

## 2021-05-22 DIAGNOSIS — I10 Essential (primary) hypertension: Secondary | ICD-10-CM | POA: Insufficient documentation

## 2021-05-22 DIAGNOSIS — M170 Bilateral primary osteoarthritis of knee: Secondary | ICD-10-CM | POA: Diagnosis not present

## 2021-05-22 DIAGNOSIS — C49A0 Gastrointestinal stromal tumor, unspecified site: Secondary | ICD-10-CM

## 2021-05-22 DIAGNOSIS — R197 Diarrhea, unspecified: Secondary | ICD-10-CM | POA: Diagnosis not present

## 2021-05-22 DIAGNOSIS — K219 Gastro-esophageal reflux disease without esophagitis: Secondary | ICD-10-CM | POA: Insufficient documentation

## 2021-05-22 DIAGNOSIS — N179 Acute kidney failure, unspecified: Secondary | ICD-10-CM | POA: Diagnosis not present

## 2021-05-22 DIAGNOSIS — E1136 Type 2 diabetes mellitus with diabetic cataract: Secondary | ICD-10-CM | POA: Diagnosis not present

## 2021-05-22 DIAGNOSIS — D72829 Elevated white blood cell count, unspecified: Secondary | ICD-10-CM | POA: Diagnosis not present

## 2021-05-22 DIAGNOSIS — D649 Anemia, unspecified: Secondary | ICD-10-CM | POA: Insufficient documentation

## 2021-05-22 LAB — CMP (CANCER CENTER ONLY)
ALT: 13 U/L (ref 0–44)
AST: 19 U/L (ref 15–41)
Albumin: 3.9 g/dL (ref 3.5–5.0)
Alkaline Phosphatase: 64 U/L (ref 38–126)
Anion gap: 3 — ABNORMAL LOW (ref 5–15)
BUN: 11 mg/dL (ref 8–23)
CO2: 28 mmol/L (ref 22–32)
Calcium: 9.2 mg/dL (ref 8.9–10.3)
Chloride: 106 mmol/L (ref 98–111)
Creatinine: 0.84 mg/dL (ref 0.44–1.00)
GFR, Estimated: >60 mL/min (ref >60)
Glucose, Bld: 106 mg/dL — ABNORMAL HIGH (ref 70–99)
Potassium: 4 mmol/L (ref 3.5–5.1)
Sodium: 137 mmol/L (ref 135–145)
Total Bilirubin: 0.5 mg/dL (ref 0.3–1.2)
Total Protein: 7.5 g/dL (ref 6.5–8.1)

## 2021-05-22 LAB — IRON AND IRON BINDING CAPACITY (CC-WL,HP ONLY)
Iron: 45 ug/dL (ref 28–170)
Saturation Ratios: 17 % (ref 10.4–31.8)
TIBC: 270 ug/dL (ref 250–450)
UIBC: 225 ug/dL (ref 148–442)

## 2021-05-22 LAB — CBC WITH DIFFERENTIAL (CANCER CENTER ONLY)
Abs Immature Granulocytes: 0.02 10*3/uL (ref 0.00–0.07)
Basophils Absolute: 0 10*3/uL (ref 0.0–0.1)
Basophils Relative: 0 %
Eosinophils Absolute: 0.3 10*3/uL (ref 0.0–0.5)
Eosinophils Relative: 4 %
HCT: 30.3 % — ABNORMAL LOW (ref 36.0–46.0)
Hemoglobin: 9.8 g/dL — ABNORMAL LOW (ref 12.0–15.0)
Immature Granulocytes: 0 %
Lymphocytes Relative: 25 %
Lymphs Abs: 2 10*3/uL (ref 0.7–4.0)
MCH: 29.4 pg (ref 26.0–34.0)
MCHC: 32.3 g/dL (ref 30.0–36.0)
MCV: 91 fL (ref 80.0–100.0)
Monocytes Absolute: 0.9 10*3/uL (ref 0.1–1.0)
Monocytes Relative: 11 %
Neutro Abs: 4.8 10*3/uL (ref 1.7–7.7)
Neutrophils Relative %: 60 %
Platelet Count: 278 10*3/uL (ref 150–400)
RBC: 3.33 MIL/uL — ABNORMAL LOW (ref 3.87–5.11)
RDW: 17.4 % — ABNORMAL HIGH (ref 11.5–15.5)
WBC Count: 8 10*3/uL (ref 4.0–10.5)
nRBC: 0 % (ref 0.0–0.2)

## 2021-05-22 LAB — FERRITIN: Ferritin: 168 ng/mL (ref 11–307)

## 2021-05-22 MED ORDER — IMATINIB MESYLATE 100 MG PO TABS: 200.0000 mg | ORAL_TABLET | Freq: Every day | ORAL | 0 refills | Status: DC

## 2021-05-22 NOTE — Progress Notes (Signed)
?[name]   ?Telephone:(336) [phone] [phone]   ?Clinic Follow up Note  ? ?Patient Care Team: ?Ngetich, Nelda Bucks, NP as PCP - General (Family Medicine) ?Calton Dach, MD as Referring Physician (Optometry) ?Latanya Maudlin, MD as Consulting Physician (Orthopedic Surgery) ?Warden Fillers, MD as Consulting Physician (Ophthalmology) ?Ladene Artist, MD as Consulting Physician (Gastroenterology) ?Stoioff, Ronda Fairly, MD (Urology) ?Stechschulte, Nickola Major, MD as Consulting Physician (Surgery) ?Truitt Merle, MD as Consulting Physician (Hematology) ? ?Date of Service:  05/22/2021 ? ?CHIEF COMPLAINT: f/u of GIST ? ?CURRENT THERAPY:  ?[place], started 03/09/21 ? ?ASSESSMENT & PLAN:  ?Alison [name] is a 76 y.o. female with  ? ?1. 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 Work up showed acute anemia and large gastric mass on CT AP on 11/14/20. Gastric mass not evident on 12/03/20 endoscopy. She underwent partial gastrectomy 01/14/21 by Dr. 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 No lymph nodes were evaluated.  ?-she has high risk of recurrence (86%) due to the large size and high grade.  ?-staging chest CT 02/17/21 negative for metastatic disease. ?-Due to her high recurrence risk, we are recommend adjuvant Imatinib (Gleevec) for 3 years to reduce that recurrence risk. We plan to obtain surveillance imaging q6 months for first 2-3 years. 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 She is tolerating well so far with mild to moderate diarrhea, improved with Imodium. ?-Due to her recent worsening epigastric pain after taking imatinib, she has stopped imatinib few weeks ago.  Her abdominal pain has resolved. ?-I encouraged her to try low-dose imatinib, will start her on  200 mg daily, if she tolerates well, will increase to 300 mg daily 2 weeks after.  I also recommended her to take imatinib with dinner (biggest meal in a day) ?-Follow-up in 5 weeks ?  ?2. Diarrhea and AKI  ?-secondary to imatinib, baseline moderate diarrhea ?-diarrhea improving ?-creatinine back to WNL today (05/22/21). ?  ?3. Anemia, leukocytosis ?-she developed mild anemia at PCP visit 09/2020, Hgb 11.3  ?-Hgb trended down to 9.3 on 11/18/20, lowest 6.8 01/2021 after surgery. She received 2 units pRBCs with surgery ?-iron panel on 02/10/21 showed low iron and elevated ferritin. ?-we previously discussed patients s/p gastric surgeries are at risk for B12 deficiency. ?  ?4. HTN, HL, DM, GERD, OA  ?-meds and f/up per PCP. 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  ?5. Social ?-Pt is single, lives with her daughter. Does not drive ?-Otherwise independent with ADLs.  ?-we can refer to SW for resources as needed ?  ?  ?PLAN: ?-restart imatinib at reduced dose, I called in 148m tablets, she will take 2017mdaily for 2 weeks then increase to 300 mg daily if she tolerates well, to next visit ?-lab and f/u in 5 weeks ? ? ?No problem-specific Assessment & Plan notes found for this encounter. ? ? ?INTERVAL HISTORY:  ?Alison [name] here for a follow up of gastric cancer. She was last seen by me on 04/18/21. She presents to the clinic accompanied by her daughter.  Patient called last week and reported worsening abdominal pain after taking imatinib.  She takes imatinib after breakfast.  No nausea, or vomiting.  Her abdominal pain has resolved since she stopped imatinib last week.  No other new complaints. ?  ?All other systems were reviewed with the patient and are negative. ? ?MEDICAL HISTORY:  ?Past Medical History:  ?Diagnosis Date  ? Anxiety   ? Benign essential hypertension   ? Blood transfusion without reported diagnosis   ? Cataract   ? removed bilateraly  ? Diabetes mellitus without complication ([place])   ?  Diverticulitis of colon (without mention of hemorrhage)(562.11)   ? GERD (gastroesophageal reflux disease)   ? Goiter, specified as simple   ? Helicobacter pylori gastritis   ? Hyperlipidemia LDL goal < 100   ? Intrahepatic bile duct dilation   ? Numbness   ? TOES RT FOOT and toes left foot  ? Obesity, unspecified   ? Osteoarthrosis, unspecified whether generalized or localized, lower leg   ? Osteopenia   ? Other specified disease of nail   ? FUNGUS OF FINGERNAILS  ? Pernicious anemia   ? Unspecified glaucoma(365.9)   ? Unspecified vitamin D deficiency   ? Wears dentures   ? Wears glasses   ? ? ?SURGICAL HISTORY: ?Past Surgical History:  ?Procedure Laterality Date  ? BACK SURGERY    ? CATARACT EXTRACTION    ? right eye 06/20/15 left eye 09/12/15  ? COLECTOMY  2009  ? COLONOSCOPY  09/06/2007  ? Dr Carol Ada  ? DILATION AND CURETTAGE OF UTERUS    ? ENDOSCOPIC RETROGRADE CHOLANGIOPANCREATOGRAPHY (ERCP) WITH PROPOFOL N/A 03/17/2019  ? Procedure: ENDOSCOPIC RETROGRADE CHOLANGIOPANCREATOGRAPHY (ERCP) WITH PROPOFOL;  Surgeon: Ladene Artist, MD;  Location[facility] ENDOSCOPY;  Service: Endoscopy;  Laterality: N/A;  ? JOINT REPLACEMENT    ? right  ? LUMBAR LAMINECTOMY/DECOMPRESSION MICRODISCECTOMY N/A 03/30/2013  ? Procedure: CENTRAL DECOMPRESSION L4 - L5 AND EXCISION 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;  Surgeon: Tobi Bastos, MD;  Location: [facility] ORS;  Service: Orthopedics;  Laterality: N/A;  ? MULTIPLE TOOTH EXTRACTIONS    ? REMOVAL OF STONES  03/17/2019  ? Procedure: REMOVAL OF STONES;  Surgeon: Ladene Artist, MD;  Location: [facility] ENDOSCOPY;  Service: Endoscopy;;  ? SIGMOIDOSCOPY  02/16/2018  ? Dr Fuller Plan  ? SPHINCTEROTOMY  03/17/2019  ? Procedure: SPHINCTEROTOMY;  Surgeon: Ladene Artist, MD;  Location: [facility] ENDOSCOPY;  Service: Endoscopy;;  ? TOTAL KNEE ARTHROPLASTY Right 02/01/2013  ? Procedure: RIGHT TOTAL KNEE ARTHROPLASTY;  Surgeon: Tobi Bastos, MD;  Location: [facility] ORS;  Service: Orthopedics;  Laterality: Right;  ? TOTAL KNEE  ARTHROPLASTY Left 04/18/2014  ? Procedure: LEFT TOTAL KNEE ARTHROPLASTY;  Surgeon: Latanya Maudlin, MD;  Location: [facility] ORS;  Service: Orthopedics;  Laterality: Left;  ? ? ?I have reviewed the social history and family history with the patient and they are unchanged from previous note. ? ?ALLERGIES:  is allergic to aspirin. ? ?MEDICATIONS:  ?Current Outpatient Medications  ?Medication Sig Dispense Refill  ? imatinib (GLEEVEC) 100 MG tablet Take 2 tablets (200 mg total) by mouth daily. If tolerate well, increase to 3 tabs daily after two weeks. Take with meals and large glass of water.Caution:Chemotherapy 90 tablet 0  ? acetaminophen (TYLENOL) 650 MG CR tablet Take 1,300 mg by mouth in the morning and at bedtime.    ? amLODipine (NORVASC) 5 MG tablet Take 1 tablet by mouth once daily 90 tablet 1  ? aspirin EC 81 MG tablet Take 81 mg by mouth daily.    ? atorvastatin (LIPITOR) 40 MG tablet TAKE 1 TABLET BY MOUTH ONCE DAILY AT  6PM 90 tablet 1  ? Calcium Carb-Cholecalciferol (CALCIUM 600 +  D PO) Take 1 tablet by mouth in the morning.    ? carboxymethylcellulose (REFRESH PLUS) 0.5 % SOLN Place 1 drop into both eyes 3 (three) times daily as needed (dry eyes).     ? celecoxib (CELEBREX) 200 MG capsule Take 200 mg by mouth in the morning.    ? Cholecalciferol (VITAMIN D-3) 1000 UNITS CAPS Take 1,000 Units by mouth in the morning.    ? dorzolamide-timolol (COSOPT) 22.3-6.8 MG/ML ophthalmic solution Place 1 drop into both eyes 2 (two) times daily.    ? gabapentin (NEURONTIN) 300 MG capsule Take 1 capsule by mouth at bedtime 90 capsule 1  ? Homeopathic Products (THERAWORX RELIEF EX) Apply 1 application topically daily as needed (pain).    ? latanoprost (XALATAN) 0.005 % ophthalmic solution Place 1 drop into both eyes at bedtime.    ? Lidocaine HCl 4 % CREA Apply 1 application topically 2 (two) times daily as needed (knee pain.).    ? lisinopril (ZESTRIL) 30 MG tablet Take 1 tablet by mouth once daily 90 tablet 0  ? metFORMIN  (GLUCOPHAGE) 500 MG tablet TAKE 1/2 (ONE-HALF) TABLET BY MOUTH IN THE EVENING WITH SUPPER 45 tablet 2  ? Multiple Vitamin (MULTIVITAMIN WITH MINERALS) TABS tablet Take 1 tablet by mouth daily.    ? neomycin-bacitracin-po

## 2021-05-24 ENCOUNTER — Other Ambulatory Visit: Payer: Self-pay | Admitting: Family

## 2021-05-24 DIAGNOSIS — E1169 Type 2 diabetes mellitus with other specified complication: Secondary | ICD-10-CM

## 2021-05-30 NOTE — Progress Notes (Signed)
  Subjective:  Patient ID: Alison [name], female    DOB: [birth year],  MRN: [id number]  Kyleah Pensabene presents to clinic today for at risk foot care with history of diabetic neuropathy and corn(s) b/l lower extremities, callus(es) b/l lower extremities and painful mycotic nails.  Pain interferes with ambulation. Aggravating factors include wearing enclosed shoe gear. Painful toenails interfere with ambulation. Aggravating factors include wearing enclosed shoe gear. Pain is relieved with periodic professional debridement. Painful corns and calluses are aggravated when weightbearing with and without shoegear. Pain is relieved with periodic professional debridement.  Last known HgA1c was unknown. Patient does not monitor blood glucose daily.  New problem(s): None.   PCP is Ngetich, Nelda Bucks, NP , and last visit was April 01, 2021.  Allergies  Allergen Reactions   Aspirin Nausea Only and Nausea And Vomiting    Can take 81 mg but can't take '325mg'$ , hard on stomach. Can take 81 mg but can't take '325mg'$ , hard on stomach.    Review of Systems: Negative except as noted in the HPI.  Objective: No changes noted in today's physical examination.  There were no vitals filed for this visit.  Patriciaann Rabanal is a pleasant 76 y.o. female in NAD. AAO X 3.  Vascular Examination: CFT <3 seconds b/l LE. Faintly palpable pedal pulses b/l LE. Pedal hair absent b/l LE. Skin temperature gradient WNL b/l. No pain with calf compression b/l. No edema b/l LE. No cyanosis or clubbing noted b/l LE.  Dermatological Examination: Pedal skin thin and atrophic b/l LE. No open wounds b/l LE. No interdigital macerations noted b/l LE. Toenails 1-5 b/l elongated, discolored, dystrophic, thickened, crumbly with subungual debris and tenderness to dorsal palpation. Hyperkeratotic lesion(s) lateral nail border bilateral 5th toes and submet head 5 b/l.  No erythema, no edema, no drainage, no fluctuance.  Macule, tear drop shaped,  noted lateral aspect right 5th metatarsal head measuring 0.5 x 0.3 cm, flat. Vareigated color. No elevation, no palpable subcutaneous depth. No bleeding  Musculoskeletal Examination: Normal muscle strength 5/5 to all lower extremity muscle groups bilaterally. HAV with bunion deformity noted b/l LE. Hammertoe deformity noted 2-5 b/l. No pain, crepitus or joint limitation noted with ROM b/l LE.  Patient ambulates independently without assistive aids.  Neurological Examination: Pt has subjective symptoms of neuropathy. Protective sensation decreased with 10 gram monofilament b/l. Vibratory sensation intact b/l.     Latest Ref Rng & Units 03/28/2021   10:01 AM 01/03/2021    8:53 AM 09/23/2020    8:50 AM  Hemoglobin A1C  Hemoglobin-A1c 4.8 - 5.6 % 5.6   5.5   5.8     Assessment/Plan: 1. Pain due to onychomycosis of toenails of both feet   2. Corns and callosities   3. Diabetic peripheral neuropathy associated with type 2 diabetes mellitus ([place])     -Patient was evaluated and treated. All patient's and/or POA's questions/concerns answered on today's visit. -Patient to continue soft, supportive shoe gear daily. -Toenails 1-5 b/l were debrided in length and girth with sterile nail nippers and dremel without iatrogenic bleeding.  -Corn(s) lateral nailfold bilateral 5th digits and callus(es) submet head 5 b/l were pared utilizing sterile scalpel blade without incident. Total number debrided =4. -Patient/POA to call should there be question/concern in the interim.   Return in about 4 months (around 09/21/2021).  Marzetta Board, DPM

## 2021-06-04 ENCOUNTER — Telehealth: Payer: Self-pay | Admitting: Pharmacist

## 2021-06-04 NOTE — Telephone Encounter (Signed)
Oral Chemotherapy Pharmacist Encounter   Received phone call from patient regarding issues with having stomach ache when starting back on imatinib Monday evening, 06/02/21. Patient started on dose reduction of imatinib 2 tablets (200 mg total) 06/02/21 PM. Patient endorsed nausea x 1 with relief from ondansetron on 06/02/21 and diarrhea x 1 on 06/03/21 which resolved with use of loperamide.  Spoke with Dr. Burr Medico and she recommends patient reduce dose of imatinib to 1 tablet (100 mg total) by mouth daily for the next 2 weeks, and if tolerated, patient increase back up to 2 tablets (200 mg total) by mouth daily thereafter until next office visit.   Called patient back to inform of the above.Patient expressed understanding and will take 1 tablet (100 mg total) of imatinib starting tonight after dinner.  Patient knows to call the office with questions or concerns.  Leron Croak, PharmD, BCPS Hematology/Oncology Clinical Pharmacist [facility] [place] [phone] 06/04/2021 11:38 AM

## 2021-06-18 ENCOUNTER — Other Ambulatory Visit: Payer: Self-pay | Admitting: Family

## 2021-06-18 DIAGNOSIS — I10 Essential (primary) hypertension: Secondary | ICD-10-CM

## 2021-06-30 ENCOUNTER — Other Ambulatory Visit: Payer: Self-pay

## 2021-06-30 ENCOUNTER — Encounter: Payer: Self-pay | Admitting: Hematology

## 2021-06-30 ENCOUNTER — Inpatient Hospital Stay: Payer: Medicare Other | Attending: Hematology | Admitting: Hematology

## 2021-06-30 ENCOUNTER — Inpatient Hospital Stay: Payer: Medicare Other

## 2021-06-30 VITALS — BP 140/75 | HR 64 | Temp 97.9°F | Resp 18 | Ht 65.0 in | Wt 181.7 lb

## 2021-06-30 DIAGNOSIS — R197 Diarrhea, unspecified: Secondary | ICD-10-CM | POA: Insufficient documentation

## 2021-06-30 DIAGNOSIS — M1990 Unspecified osteoarthritis, unspecified site: Secondary | ICD-10-CM | POA: Diagnosis not present

## 2021-06-30 DIAGNOSIS — E785 Hyperlipidemia, unspecified: Secondary | ICD-10-CM | POA: Diagnosis not present

## 2021-06-30 DIAGNOSIS — C49A2 Gastrointestinal stromal tumor of stomach: Secondary | ICD-10-CM | POA: Diagnosis not present

## 2021-06-30 DIAGNOSIS — K219 Gastro-esophageal reflux disease without esophagitis: Secondary | ICD-10-CM | POA: Diagnosis not present

## 2021-06-30 DIAGNOSIS — I10 Essential (primary) hypertension: Secondary | ICD-10-CM | POA: Diagnosis not present

## 2021-06-30 DIAGNOSIS — C49A0 Gastrointestinal stromal tumor, unspecified site: Secondary | ICD-10-CM

## 2021-06-30 DIAGNOSIS — D649 Anemia, unspecified: Secondary | ICD-10-CM | POA: Diagnosis not present

## 2021-06-30 DIAGNOSIS — D72829 Elevated white blood cell count, unspecified: Secondary | ICD-10-CM | POA: Insufficient documentation

## 2021-06-30 LAB — CMP (CANCER CENTER ONLY)
ALT: 11 U/L (ref 0–44)
AST: 17 U/L (ref 15–41)
Albumin: 3.8 g/dL (ref 3.5–5.0)
Alkaline Phosphatase: 58 U/L (ref 38–126)
Anion gap: 2 — ABNORMAL LOW (ref 5–15)
BUN: 12 mg/dL (ref 8–23)
CO2: 28 mmol/L (ref 22–32)
Calcium: 9.3 mg/dL (ref 8.9–10.3)
Chloride: 106 mmol/L (ref 98–111)
Creatinine: 1.02 mg/dL — ABNORMAL HIGH (ref 0.44–1.00)
GFR, Estimated: 57 mL/min — ABNORMAL LOW (ref >60)
Glucose, Bld: 98 mg/dL (ref 70–99)
Potassium: 4.4 mmol/L (ref 3.5–5.1)
Sodium: 136 mmol/L (ref 135–145)
Total Bilirubin: 0.4 mg/dL (ref 0.3–1.2)
Total Protein: 7.1 g/dL (ref 6.5–8.1)

## 2021-06-30 LAB — CBC WITH DIFFERENTIAL (CANCER CENTER ONLY)
Abs Immature Granulocytes: 0.02 10*3/uL (ref 0.00–0.07)
Basophils Absolute: 0.1 10*3/uL (ref 0.0–0.1)
Basophils Relative: 1 %
Eosinophils Absolute: 0.2 10*3/uL (ref 0.0–0.5)
Eosinophils Relative: 2 %
HCT: 32.6 % — ABNORMAL LOW (ref 36.0–46.0)
Hemoglobin: 10.6 g/dL — ABNORMAL LOW (ref 12.0–15.0)
Immature Granulocytes: 0 %
Lymphocytes Relative: 21 %
Lymphs Abs: 1.9 10*3/uL (ref 0.7–4.0)
MCH: 29.4 pg (ref 26.0–34.0)
MCHC: 32.5 g/dL (ref 30.0–36.0)
MCV: 90.6 fL (ref 80.0–100.0)
Monocytes Absolute: 0.7 10*3/uL (ref 0.1–1.0)
Monocytes Relative: 7 %
Neutro Abs: 6.3 10*3/uL (ref 1.7–7.7)
Neutrophils Relative %: 69 %
Platelet Count: 325 10*3/uL (ref 150–400)
RBC: 3.6 MIL/uL — ABNORMAL LOW (ref 3.87–5.11)
RDW: 14.1 % (ref 11.5–15.5)
WBC Count: 9.1 10*3/uL (ref 4.0–10.5)
nRBC: 0 % (ref 0.0–0.2)

## 2021-06-30 MED ORDER — CELECOXIB 200 MG PO CAPS: 200.0000 mg | ORAL_CAPSULE | Freq: Every day | ORAL | 1 refills | Status: DC | PRN

## 2021-06-30 NOTE — Progress Notes (Signed)
[place]   [phone] [phone]   Clinic Follow up Note   Patient Care Team: Ngetich, Nelda Bucks, NP as PCP - General (Family Medicine) Shirley Muscat Loreen Freud, MD as Referring Physician (Optometry) Latanya Maudlin, MD as Consulting Physician (Orthopedic Surgery) Warden Fillers, MD as Consulting Physician (Ophthalmology) Ladene Artist, MD as Consulting Physician (Gastroenterology) Abbie Sons, MD (Urology) Stechschulte, Nickola Major, MD as Consulting Physician (Surgery) Truitt Merle, MD as Consulting Physician (Hematology)  Date of Service:  06/30/2021  CHIEF COMPLAINT: f/u of GIST  CURRENT THERAPY:  Gleevec, started 03/09/21  ASSESSMENT & PLAN:  Alison [name] is a 76 y.o. female with   1. 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 Work up showed acute anemia and large gastric mass on CT AP on 11/14/20. Gastric mass not evident on 12/03/20 endoscopy. S/p partial gastrectomy 01/14/21 by Dr. 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 No lymph nodes were evaluated.  -she has high risk of recurrence (86%) due to the large size and high grade.  -staging chest CT 02/17/21 negative for metastatic disease. 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 She is tolerating well so far with mild to moderate diarrhea, improved with Imodium. -Due to her recent worsening epigastric pain after taking imatinib, she has stopped imatinib in late 04/2021. Her abdominal pain resolved. -she restarted Gleevec at 100 mg on 05/22/21 and increased to 200 mg ~06/20/21. She reports she is tolerating well. I encouraged her to try increasing to 300 mg daily next Monday. -She is clinically doing well overall, does report new mild left flank pain lately. -Follow-up in 3 weeks with severe CT  scan.   2. Diarrhea and AKI  -secondary to imatinib, baseline moderate diarrhea -diarrhea and AKI resolved  -creatinine overall stable at 1.02 today (06/30/21).   3. Anemia, leukocytosis -she developed mild anemia at PCP visit 09/2020 -Hgb trended down, lowest 6.8 01/2021 after surgery. She received 2 units pRBCs with surgery -we previously discussed patients s/p gastric surgeries are at risk for B12 deficiency. -iron panel and ferritin on 05/22/21 were WNL. Hgb up to 10.6 today (06/30/21).   4. HTN, HL, DM, GERD, OA  -meds and f/up per PCP. 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 She requests a refill, as her orthopedist will not. I called in celebrex and advised her not to take it for several days around her scan.   5. Social -Pt is single, lives with her daughter. Does not drive -Otherwise independent with ADLs.  -I previously referred her to social work     PLAN: -continue imatinib at $RemoveBef'200mg'WCIrDZAMYh$ . She will try to increase to 300 mg daily next Monday  -I refilled celebrex per her request -f/u in 3 weeks, with lab and CT abdomen and pelvis with contrast several days before   No problem-specific Assessment & Plan notes found for this encounter.   INTERVAL HISTORY:  Alison [name] is here for a follow up of GIST. She was last seen by me on 05/22/21. She presents to the clinic alone. She reports she is tolerating 200 mg Gleevec well with no noticeable side effects. She does report some new back pain that started recently.   All other systems were reviewed with the patient and are negative.  MEDICAL  HISTORY:  Past Medical History:  Diagnosis Date   Anxiety    Benign essential hypertension    Blood transfusion without reported diagnosis    Cataract    removed bilateraly   Diabetes mellitus without complication ([place])    Diverticulitis of colon (without mention of hemorrhage)(562.11)    GERD (gastroesophageal reflux disease)     Goiter, specified as simple    Helicobacter pylori gastritis    Hyperlipidemia LDL goal < 100    Intrahepatic bile duct dilation    Numbness    TOES RT FOOT and toes left foot   Obesity, unspecified    Osteoarthrosis, unspecified whether generalized or localized, lower leg    Osteopenia    Other specified disease of nail    FUNGUS OF FINGERNAILS   Pernicious anemia    Unspecified glaucoma(365.9)    Unspecified vitamin D deficiency    Wears dentures    Wears glasses     SURGICAL HISTORY: Past Surgical History:  Procedure Laterality Date   BACK SURGERY     CATARACT EXTRACTION     right eye 06/20/15 left eye 09/12/15   COLECTOMY  2009   COLONOSCOPY  09/06/2007   Dr Carol Ada   DILATION AND CURETTAGE OF UTERUS     ENDOSCOPIC RETROGRADE CHOLANGIOPANCREATOGRAPHY (ERCP) WITH PROPOFOL N/A 03/17/2019   Procedure: ENDOSCOPIC RETROGRADE CHOLANGIOPANCREATOGRAPHY (ERCP) WITH PROPOFOL;  Surgeon: Ladene Artist, MD;  Location[facility] ENDOSCOPY;  Service: Endoscopy;  Laterality: N/A;   JOINT REPLACEMENT     right   LUMBAR LAMINECTOMY/DECOMPRESSION MICRODISCECTOMY N/A 03/30/2013   Procedure: CENTRAL DECOMPRESSION L4 - L5 AND EXCISION 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;  Surgeon: Tobi Bastos, MD;  Location: [facility] ORS;  Service: Orthopedics;  Laterality: N/A;   MULTIPLE TOOTH EXTRACTIONS     REMOVAL OF STONES  03/17/2019   Procedure: REMOVAL OF STONES;  Surgeon: Ladene Artist, MD;  Location: [place];  Service: Endoscopy;;   SIGMOIDOSCOPY  02/16/2018   Dr Nira Conn  03/17/2019   Procedure: SPHINCTEROTOMY;  Surgeon: Ladene Artist, MD;  Location: [place];  Service: Endoscopy;;   TOTAL KNEE ARTHROPLASTY Right 02/01/2013   Procedure: RIGHT TOTAL KNEE ARTHROPLASTY;  Surgeon: Tobi Bastos, MD;  Location: [facility];  Service: Orthopedics;  Laterality: Right;   TOTAL KNEE ARTHROPLASTY Left 04/18/2014   Procedure: LEFT TOTAL KNEE ARTHROPLASTY;  Surgeon: Latanya Maudlin, MD;  Location: [facility]  [facility];  Service: Orthopedics;  Laterality: Left;    I have reviewed the social history and family history with the patient and they are unchanged from previous note.  ALLERGIES:  is allergic to aspirin.  MEDICATIONS:  Current Outpatient Medications  Medication Sig Dispense Refill   acetaminophen (TYLENOL) 650 MG CR tablet Take 1,300 mg by mouth in the morning and at bedtime.     amLODipine (NORVASC) 5 MG tablet Take 1 tablet by mouth once daily 90 tablet 1   aspirin EC 81 MG tablet Take 81 mg by mouth daily.     atorvastatin (LIPITOR) 40 MG tablet TAKE 1 TABLET BY MOUTH ONCE DAILY AT  6:00 PM 90 tablet 0   Calcium Carb-Cholecalciferol (CALCIUM 600 + D PO) Take 1 tablet by mouth in the morning.     carboxymethylcellulose (REFRESH PLUS) 0.5 % SOLN Place 1 drop into both eyes 3 (three) times daily as needed (dry eyes).      celecoxib (CELEBREX) 200 MG capsule Take 1 capsule (200 mg total) by mouth daily as needed.  30 capsule 1   Cholecalciferol (VITAMIN D-3) 1000 UNITS CAPS Take 1,000 Units by mouth in the morning.     dorzolamide-timolol (COSOPT) 22.3-6.8 MG/ML ophthalmic solution Place 1 drop into both eyes 2 (two) times daily.     gabapentin (NEURONTIN) 300 MG capsule Take 1 capsule by mouth at bedtime 90 capsule 1   Homeopathic Products (THERAWORX RELIEF EX) Apply 1 application topically daily as needed (pain).     imatinib (GLEEVEC) 100 MG tablet Take 2 tablets (200 mg total) by mouth daily. If tolerate well, increase to 3 tabs daily after two weeks. Take with meals and large glass of water.Caution:Chemotherapy 90 tablet 0   latanoprost (XALATAN) 0.005 % ophthalmic solution Place 1 drop into both eyes at bedtime.     Lidocaine HCl 4 % CREA Apply 1 application topically 2 (two) times daily as needed (knee pain.).     lisinopril (ZESTRIL) 30 MG tablet Take 1 tablet by mouth once daily 90 tablet 0   metFORMIN (GLUCOPHAGE) 500 MG tablet TAKE 1/2 (ONE-HALF) TABLET BY MOUTH IN THE EVENING WITH  SUPPER 45 tablet 2   Multiple Vitamin (MULTIVITAMIN WITH MINERALS) TABS tablet Take 1 tablet by mouth daily.     neomycin-bacitracin-polymyxin (NEOSPORIN) ointment Apply 1 application topically as needed for wound care.     nystatin cream (MYCOSTATIN) Apply 1 application topically 2 (two) times daily. 30 g 1   Omega-3 Fatty Acids (OMEGA 3 PO) Take 1,200 mg by mouth in the morning.     ondansetron (ZOFRAN) 8 MG tablet Take 1 tablet (8 mg total) by mouth every 8 (eight) hours as needed for nausea or vomiting. 20 tablet 2   pantoprazole (PROTONIX) 40 MG tablet Take 1 tablet (40 mg total) by mouth daily. 30 tablet 11   Zinc Oxide (DESITIN EX) Apply 1 application topically as needed.     No current facility-administered medications for this visit.    PHYSICAL EXAMINATION: ECOG PERFORMANCE STATUS: 0 - Asymptomatic  Vitals:   06/30/21 1155  BP: 140/75  Pulse: 64  Resp: 18  Temp: 97.9 F (36.6 C)  SpO2: 100%   Wt Readings from Last 3 Encounters:  06/30/21 181 lb 11.2 oz (82.4 kg)  05/22/21 181 lb 8 oz (82.3 kg)  04/18/21 189 lb 8 oz (86 kg)     GENERAL:alert, no distress and comfortable SKIN: skin color normal, no rashes or significant lesions EYES: normal, Conjunctiva are pink and non-injected, sclera clear  NEURO: alert & oriented x 3 with fluent speech  LABORATORY DATA:  I have reviewed the data as listed    Latest Ref Rng & Units 06/30/2021   10:38 AM 05/22/2021   10:35 AM 04/18/2021   10:39 AM  CBC  WBC 4.0 - 10.5 K/uL 9.1  8.0  8.2   Hemoglobin 12.0 - 15.0 g/dL 10.6  9.8  9.6   Hematocrit 36.0 - 46.0 % 32.6  30.3  30.4   Platelets 150 - 400 K/uL 325  278  272         Latest Ref Rng & Units 06/30/2021   10:38 AM 05/22/2021   10:35 AM 04/18/2021   10:39 AM  CMP  Glucose 70 - 99 mg/dL 98  106  109   BUN 8 - 23 mg/dL $Remove'12  11  10   'hDnlzoa$ Creatinine 0.44 - 1.00 mg/dL 1.02  0.84  1.06   Sodium 135 - 145 mmol/L 136  137  135   Potassium 3.5 - 5.1 mmol/L 4.4  4.0  4.2   Chloride 98  - 111 mmol/L 106  106  107   CO2 22 - 32 mmol/L $RemoveB'28  28  26   'PBcEdHwc$ Calcium 8.9 - 10.3 mg/dL 9.3  9.2  8.6   Total Protein 6.5 - 8.1 g/dL 7.1  7.5  6.9   Total Bilirubin 0.3 - 1.2 mg/dL 0.4  0.5  0.6   Alkaline Phos 38 - 126 U/L 58  64  67   AST 15 - 41 U/L $Remo'17  19  21   'cBXOv$ ALT 0 - 44 U/L $Remo'11  13  16       'vMgJC$ RADIOGRAPHIC STUDIES: I have personally reviewed the radiological images as listed and agreed with the findings in the report. No results found.    Orders Placed This Encounter  Procedures   CT CHEST ABDOMEN PELVIS W CONTRAST    Standing Status:   Future    Standing Expiration Date:   07/01/2022    Order Specific Question:   Preferred imaging location?    Answer:   [facility]    Order Specific Question:   Is Oral Contrast requested for this exam?    Answer:   Yes, Per Radiology protocol   All questions were answered. The patient knows to call the clinic with any problems, questions or concerns. No barriers to learning was detected. The total time spent in the appointment was 30 minutes.     Truitt Merle, MD 06/30/2021   I, Wilburn Mylar, am acting as scribe for Truitt Merle, MD.   I have reviewed the above documentation for accuracy and completeness, and I agree with the above.

## 2021-07-02 ENCOUNTER — Other Ambulatory Visit: Payer: Self-pay

## 2021-07-02 DIAGNOSIS — E119 Type 2 diabetes mellitus without complications: Secondary | ICD-10-CM | POA: Diagnosis not present

## 2021-07-02 DIAGNOSIS — C49A0 Gastrointestinal stromal tumor, unspecified site: Secondary | ICD-10-CM

## 2021-07-02 DIAGNOSIS — H43813 Vitreous degeneration, bilateral: Secondary | ICD-10-CM | POA: Diagnosis not present

## 2021-07-02 DIAGNOSIS — Z961 Presence of intraocular lens: Secondary | ICD-10-CM | POA: Diagnosis not present

## 2021-07-02 DIAGNOSIS — H401131 Primary open-angle glaucoma, bilateral, mild stage: Secondary | ICD-10-CM | POA: Diagnosis not present

## 2021-07-02 DIAGNOSIS — H35033 Hypertensive retinopathy, bilateral: Secondary | ICD-10-CM | POA: Diagnosis not present

## 2021-07-02 DIAGNOSIS — H26493 Other secondary cataract, bilateral: Secondary | ICD-10-CM | POA: Diagnosis not present

## 2021-07-02 LAB — HM DIABETES EYE EXAM

## 2021-07-02 MED ORDER — IMATINIB MESYLATE 100 MG PO TABS: 300.0000 mg | ORAL_TABLET | Freq: Every day | ORAL | 1 refills | Status: DC

## 2021-07-03 ENCOUNTER — Encounter: Payer: Medicare Other | Admitting: Family

## 2021-07-04 ENCOUNTER — Telehealth: Payer: Self-pay

## 2021-07-04 ENCOUNTER — Ambulatory Visit (INDEPENDENT_AMBULATORY_CARE_PROVIDER_SITE_OTHER): Payer: Medicare Other | Admitting: Family

## 2021-07-04 ENCOUNTER — Encounter: Payer: Self-pay | Admitting: Family

## 2021-07-04 DIAGNOSIS — Z0000 Encounter for general adult medical examination without abnormal findings: Secondary | ICD-10-CM

## 2021-07-04 NOTE — Telephone Encounter (Signed)
Ms. Alison [name], Alison [name] are scheduled for a virtual visit with your provider today.    Just as we do with appointments in the office, we must obtain your consent to participate.  Your consent will be active for this visit and any virtual visit you may have with one of our providers in the next 365 days.    If you have a MyChart account, I can also send a copy of this consent to you electronically.  All virtual visits are billed to your insurance company just like a traditional visit in the office.  As this is a virtual visit, video technology does not allow for your provider to perform a traditional examination.  This may limit your provider's ability to fully assess your condition.  If your provider identifies any concerns that need to be evaluated in person or the need to arrange testing such as labs, EKG, etc, we will make arrangements to do so.    Although advances in technology are sophisticated, we cannot ensure that it will always work on either your end or our end.  If the connection with a video visit is poor, we may have to switch to a telephone visit.  With either a video or telephone visit, we are not always able to ensure that we have a secure connection.   I need to obtain your verbal consent now.   Are you willing to proceed with your visit today?   Alison [name] has provided verbal consent on 07/04/2021 for a virtual visit (video or telephone).   Edison Simon [place], [place] 07/04/2021  12:33 PM

## 2021-07-10 ENCOUNTER — Encounter: Payer: Self-pay | Admitting: Family

## 2021-07-10 ENCOUNTER — Ambulatory Visit (INDEPENDENT_AMBULATORY_CARE_PROVIDER_SITE_OTHER): Payer: Medicare Other | Admitting: Family

## 2021-07-10 VITALS — BP 126/70 | HR 70 | Temp 97.2°F | Resp 16 | Ht 65.0 in | Wt 183.2 lb

## 2021-07-10 DIAGNOSIS — B3731 Acute candidiasis of vulva and vagina: Secondary | ICD-10-CM | POA: Diagnosis not present

## 2021-07-10 MED ORDER — NYSTATIN 100000 UNIT/GM EX CREA: 1.0000 | TOPICAL_CREAM | Freq: Two times a day (BID) | CUTANEOUS | 0 refills | Status: DC

## 2021-07-10 NOTE — Progress Notes (Signed)
Provider: Marlowe Sax FNP-C  Jaquetta Currier, Nelda Bucks, NP  Patient Care Team: Ellizabeth Dacruz, Nelda Bucks, NP as PCP - General (Family Medicine) Shirley Muscat Loreen Freud, MD as Referring Physician (Optometry) Latanya Maudlin, MD as Consulting Physician (Orthopedic Surgery) Warden Fillers, MD as Consulting Physician (Ophthalmology) Ladene Artist, MD as Consulting Physician (Gastroenterology) Abbie Sons, MD (Urology) Stechschulte, Nickola Major, MD as Consulting Physician (Surgery) Truitt Merle, MD as Consulting Physician (Hematology)  Extended Emergency Contact Information Primary Emergency Contact: Schaus,[name] Address: [address]          [place], [place] [address] [name] of [place] Phone: [phone] Mobile Phone: [phone] Relation: Daughter Secondary Emergency Contact: [place], [place] [address] [name] of [place] Phone: [phone] Mobile Phone: [phone] Relation: Sister  Code Status: Full code Goals of care: Advanced Directive information    07/10/2021    2:20 PM  Advanced Directives  Does Patient Have a Medical Advance Directive? No  Would patient like information on creating a medical advance directive? No - Patient declined     Chief Complaint  Patient presents with   Acute Visit    Patient complains of itching in groin area that started 1 week ago.     HPI:  Pt is a 76 y.o. female seen today for an acute visit for evaluation of itching on groin area and labia x1 week. She rubbed too hard and it bleed. No discharge noted.Also denies any symptoms of urinary tract infection, fever or chills.she used Desitin cream which helped.Not itching right now.    Past Medical History:  Diagnosis Date   Anxiety    Benign essential hypertension    Blood transfusion without reported diagnosis    Cataract    removed bilateraly   Diabetes mellitus without complication ([place])    Diverticulitis of colon (without mention of  hemorrhage)(562.11)    GERD (gastroesophageal reflux disease)    Goiter, specified as simple    Helicobacter pylori gastritis    Hyperlipidemia LDL goal < 100    Intrahepatic bile duct dilation    Numbness    TOES RT FOOT and toes left foot   Obesity, unspecified    Osteoarthrosis, unspecified whether generalized or localized, lower leg    Osteopenia    Other specified disease of nail    FUNGUS OF FINGERNAILS   Pernicious anemia    Unspecified glaucoma(365.9)    Unspecified vitamin D deficiency    Wears dentures    Wears glasses    Past Surgical History:  Procedure Laterality Date   BACK SURGERY     CATARACT EXTRACTION     right eye 06/20/15 left eye 09/12/15   COLECTOMY  2009   COLONOSCOPY  09/06/2007   Dr Carol Ada   DILATION AND CURETTAGE OF UTERUS     ENDOSCOPIC RETROGRADE CHOLANGIOPANCREATOGRAPHY (ERCP) WITH PROPOFOL N/A 03/17/2019   Procedure: ENDOSCOPIC RETROGRADE CHOLANGIOPANCREATOGRAPHY (ERCP) WITH PROPOFOL;  Surgeon: Ladene Artist, MD;  Location: [facility] ENDOSCOPY;  Service: Endoscopy;  Laterality: N/A;   JOINT REPLACEMENT     right   LUMBAR LAMINECTOMY/DECOMPRESSION MICRODISCECTOMY N/A 03/30/2013   Procedure: CENTRAL DECOMPRESSION L4 - L5 AND EXCISION 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;  Surgeon: Tobi Bastos, MD;  Location: [facility];  Service: Orthopedics;  Laterality: N/A;   MULTIPLE TOOTH EXTRACTIONS     REMOVAL OF STONES  03/17/2019   Procedure: REMOVAL OF STONES;  Surgeon: Ladene Artist, MD;  Location: [facility]  ENDOSCOPY;  Service: Endoscopy;;   SIGMOIDOSCOPY  02/16/2018   Dr Fuller Plan   SPHINCTEROTOMY  03/17/2019   Procedure: SPHINCTEROTOMY;  Surgeon: Ladene Artist, MD;  Location: [place];  Service: Endoscopy;;   TOTAL KNEE ARTHROPLASTY Right 02/01/2013   Procedure: RIGHT TOTAL KNEE ARTHROPLASTY;  Surgeon: Tobi Bastos, MD;  Location: [facility];  Service: Orthopedics;  Laterality: Right;   TOTAL KNEE ARTHROPLASTY Left 04/18/2014   Procedure: LEFT TOTAL KNEE  ARTHROPLASTY;  Surgeon: Latanya Maudlin, MD;  Location: [facility];  Service: Orthopedics;  Laterality: Left;    Allergies  Allergen Reactions   Aspirin Nausea Only and Nausea And Vomiting    Can take 81 mg but can't take '325mg'$ , hard on stomach. Can take 81 mg but can't take '325mg'$ , hard on stomach.    Outpatient Encounter Medications as of 07/10/2021  Medication Sig   acetaminophen (TYLENOL) 650 MG CR tablet Take 1,300 mg by mouth as needed.   amLODipine (NORVASC) 5 MG tablet Take 1 tablet by mouth once daily   aspirin EC 81 MG tablet Take 81 mg by mouth daily.   atorvastatin (LIPITOR) 40 MG tablet TAKE 1 TABLET BY MOUTH ONCE DAILY AT  6:00 PM   Calcium Carb-Cholecalciferol (CALCIUM 600 + D PO) Take 1 tablet by mouth in the morning.   carboxymethylcellulose (REFRESH PLUS) 0.5 % SOLN Place 1 drop into both eyes 3 (three) times daily as needed (dry eyes).    celecoxib (CELEBREX) 200 MG capsule Take 1 capsule (200 mg total) by mouth daily as needed.   Cholecalciferol (VITAMIN D-3) 1000 UNITS CAPS Take 1,000 Units by mouth in the morning.   dorzolamide-timolol (COSOPT) 22.3-6.8 MG/ML ophthalmic solution Place 1 drop into both eyes 2 (two) times daily.   gabapentin (NEURONTIN) 300 MG capsule Take 1 capsule by mouth at bedtime   imatinib (GLEEVEC) 100 MG tablet Take 3 tablets (300 mg total) by mouth daily. Take with meals and large glass of water.Caution:Chemotherapy   latanoprost (XALATAN) 0.005 % ophthalmic solution Place 1 drop into both eyes at bedtime.   Lidocaine HCl 4 % CREA Apply 1 application topically 2 (two) times daily as needed (knee pain.).   lisinopril (ZESTRIL) 30 MG tablet Take 1 tablet by mouth once daily   metFORMIN (GLUCOPHAGE) 500 MG tablet TAKE 1/2 (ONE-HALF) TABLET BY MOUTH IN THE EVENING WITH SUPPER   Multiple Vitamin (MULTIVITAMIN WITH MINERALS) TABS tablet Take 1 tablet by mouth daily.   Omega-3 Fatty Acids (OMEGA 3 PO) Take 1,200 mg by mouth in the morning.   ondansetron  (ZOFRAN) 8 MG tablet Take 1 tablet (8 mg total) by mouth every 8 (eight) hours as needed for nausea or vomiting.   pantoprazole (PROTONIX) 40 MG tablet Take 1 tablet (40 mg total) by mouth daily.   Zinc Oxide (DESITIN EX) Apply 1 application topically as needed.   No facility-administered encounter medications on file as of 07/10/2021.    Review of Systems  Constitutional:  Negative for appetite change, chills, fatigue, fever and unexpected weight change.  Respiratory:  Negative for cough, chest tightness, shortness of breath and wheezing.   Cardiovascular:  Negative for chest pain, palpitations and leg swelling.  Gastrointestinal:  Negative for abdominal distention, abdominal pain, constipation, diarrhea, nausea and vomiting.  Genitourinary:  Negative for difficulty urinating, dysuria, flank pain, frequency and urgency.  Skin:  Negative for color change, pallor and rash.       Itching on vulva     Immunization History  Administered  Date(s) Administered   Fluad Quad(high Dose 65+) 09/05/2018, 09/26/2020   Influenza, High Dose Seasonal PF 10/02/2017, 11/20/2019   Influenza,inj,Quad PF,6+ Mos 10/03/2012, 09/21/2014, 11/07/2015   Influenza,inj,quad, With Preservative 11/12/2016   Influenza-Unspecified 10/28/2013   PFIZER(Purple Top)SARS-COV-2 Vaccination 03/25/2019, 03/25/2019, 04/18/2019, 11/01/2019, 05/23/2020, 10/07/2020   Pneumococcal Conjugate-13 01/04/2014, 01/04/2014, 11/04/2016   Pneumococcal Polysaccharide-23 12/29/2012, 11/19/2017   Zoster Recombinat (Shingrix) 11/25/2016, 01/26/2017   Pertinent  Health Maintenance Due  Topic Date Due   INFLUENZA VACCINE  08/12/2021   OPHTHALMOLOGY EXAM  09/23/2021   HEMOGLOBIN A1C  09/28/2021   FOOT EXAM  02/19/2022   COLONOSCOPY (Pts 45-34yr Insurance coverage will need to be confirmed)  02/17/2023   DEXA SCAN  Completed      02/10/2021   10:48 AM 02/28/2021   10:32 AM 04/01/2021   10:52 AM 07/04/2021   12:40 PM 07/10/2021    2:20 PM   Fall Risk  Falls in the past year?  0 0 0 0  Was there an injury with Fall?  0 0 0 0  Fall Risk Category Calculator  0 0 0 0  Fall Risk Category  Low Low Low Low  Patient Fall Risk Level Low fall risk Low fall risk Low fall risk Low fall risk Low fall risk  Patient at Risk for Falls Due to  No Fall Risks No Fall Risks No Fall Risks No Fall Risks  Fall risk Follow up  Falls evaluation completed Falls evaluation completed Falls evaluation completed Falls evaluation completed   Functional Status Survey:    Vitals:   07/10/21 1416  BP: 126/70  Pulse: 70  Resp: 16  Temp: (!) 97.2 F (36.2 C)  SpO2: 94%  Weight: 183 lb 3.2 oz (83.1 kg)  Height: '5\' 5"'$  (1.651 m)   Body mass index is 30.49 kg/m. Physical Exam Vitals reviewed. Exam conducted with a chaperone present ([facility],CMA).  Constitutional:      General: She is not in acute distress.    Appearance: Normal appearance. She is obese. She is not ill-appearing or diaphoretic.  HENT:     Mouth/Throat:     Mouth: Mucous membranes are moist.     Pharynx: Oropharynx is clear. No oropharyngeal exudate or posterior oropharyngeal erythema.  Eyes:     General: No scleral icterus.       Right eye: No discharge.        Left eye: No discharge.     Conjunctiva/sclera: Conjunctivae normal.     Pupils: Pupils are equal, round, and reactive to light.  Cardiovascular:     Rate and Rhythm: Normal rate and regular rhythm.     Pulses: Normal pulses.     Heart sounds: Normal heart sounds. No murmur heard.    No friction rub. No gallop.  Pulmonary:     Effort: Pulmonary effort is normal. No respiratory distress.     Breath sounds: Normal breath sounds. No wheezing, rhonchi or rales.  Chest:     Chest wall: No tenderness.  Abdominal:     General: Bowel sounds are normal. There is no distension.     Palpations: Abdomen is soft. There is no mass.     Tenderness: There is no abdominal tenderness. There is no right CVA tenderness, left  CVA tenderness, guarding or rebound.  Genitourinary:    Exam position: Supine.     Labia:        Right: Tenderness present.        Left: Tenderness present.  Comments: Bilateral labia skin irritation erythema noted.  No drainage or skin lesion noted  Skin:    General: Skin is warm and dry.     Coloration: Skin is not pale.     Findings: No bruising, erythema, lesion or rash.  Neurological:     Mental Status: She is alert and oriented to person, place, and time.     Motor: No weakness.     Gait: Gait abnormal.  Psychiatric:        Mood and Affect: Mood normal.        Speech: Speech normal.        Behavior: Behavior normal.     Labs reviewed: Recent Labs    04/18/21 1039 05/22/21 1035 06/30/21 1038  NA 135 137 136  K 4.2 4.0 4.4  CL 107 106 106  CO2 '26 28 28  '$ GLUCOSE 109* 106* 98  BUN '10 11 12  '$ CREATININE 1.06* 0.84 1.02*  CALCIUM 8.6* 9.2 9.3   Recent Labs    04/18/21 1039 05/22/21 1035 06/30/21 1038  AST '21 19 17  '$ ALT '16 13 11  '$ ALKPHOS 67 64 58  BILITOT 0.6 0.5 0.4  PROT 6.9 7.5 7.1  ALBUMIN 3.6 3.9 3.8   Recent Labs    04/18/21 1039 05/22/21 1035 06/30/21 1038  WBC 8.2 8.0 9.1  NEUTROABS 5.5 4.8 6.3  HGB 9.6* 9.8* 10.6*  HCT 30.4* 30.3* 32.6*  MCV 88.1 91.0 90.6  PLT 272 278 325   Lab Results  Component Value Date   TSH 0.977 03/28/2021   Lab Results  Component Value Date   HGBA1C 5.6 03/28/2021   Lab Results  Component Value Date   CHOL 116 03/28/2021   HDL 44 03/28/2021   LDLCALC 59 03/28/2021   TRIG 65 03/28/2021   CHOLHDL 2.6 03/28/2021    Significant Diagnostic Results in last 30 days:  No results found.  Assessment/Plan  candidiasis of vulva Bilateral labia skin irritation erythema noted.  No drainage or skin lesion noted  - advised to cleanse labia area with warm water and soap,pat dry then apply Nystatin cream.May mix with Desitin cream for extra skin protection.  - nystatin cream (MYCOSTATIN); Apply 1 Application  topically 2 (two) times daily.  Dispense: 30 g; Refill: 0 - Notif carey provider if symptoms worsen or fail to impro headacheve    Family/ staff Communication: Reviewed plan of care with patient verbalized understanding Labs/tests ordered: None   Next Appointment: Return if symptoms worsen or fail to improve.   Sandrea Hughs, NP

## 2021-07-22 ENCOUNTER — Ambulatory Visit (HOSPITAL_COMMUNITY)
Admission: RE | Admit: 2021-07-22 | Discharge: 2021-07-22 | Disposition: A | Discharge status: Home / Self Care | Payer: Medicare Other | Source: Ambulatory Visit | Attending: Hematology | Admitting: Hematology

## 2021-07-22 ENCOUNTER — Other Ambulatory Visit: Payer: Self-pay

## 2021-07-22 ENCOUNTER — Inpatient Hospital Stay: Payer: Medicare Other | Attending: Hematology

## 2021-07-22 DIAGNOSIS — C49A0 Gastrointestinal stromal tumor, unspecified site: Secondary | ICD-10-CM

## 2021-07-22 DIAGNOSIS — I2510 Atherosclerotic heart disease of native coronary artery without angina pectoris: Secondary | ICD-10-CM | POA: Diagnosis not present

## 2021-07-22 DIAGNOSIS — E785 Hyperlipidemia, unspecified: Secondary | ICD-10-CM | POA: Insufficient documentation

## 2021-07-22 DIAGNOSIS — C169 Malignant neoplasm of stomach, unspecified: Secondary | ICD-10-CM | POA: Diagnosis not present

## 2021-07-22 DIAGNOSIS — K219 Gastro-esophageal reflux disease without esophagitis: Secondary | ICD-10-CM | POA: Insufficient documentation

## 2021-07-22 DIAGNOSIS — C49A2 Gastrointestinal stromal tumor of stomach: Secondary | ICD-10-CM | POA: Insufficient documentation

## 2021-07-22 DIAGNOSIS — N2889 Other specified disorders of kidney and ureter: Secondary | ICD-10-CM | POA: Diagnosis not present

## 2021-07-22 DIAGNOSIS — I700 Atherosclerosis of aorta: Secondary | ICD-10-CM | POA: Diagnosis not present

## 2021-07-22 DIAGNOSIS — I10 Essential (primary) hypertension: Secondary | ICD-10-CM | POA: Insufficient documentation

## 2021-07-22 DIAGNOSIS — M1990 Unspecified osteoarthritis, unspecified site: Secondary | ICD-10-CM | POA: Insufficient documentation

## 2021-07-22 DIAGNOSIS — R918 Other nonspecific abnormal finding of lung field: Secondary | ICD-10-CM | POA: Diagnosis not present

## 2021-07-22 DIAGNOSIS — Z79899 Other long term (current) drug therapy: Secondary | ICD-10-CM | POA: Insufficient documentation

## 2021-07-22 DIAGNOSIS — K8020 Calculus of gallbladder without cholecystitis without obstruction: Secondary | ICD-10-CM | POA: Diagnosis not present

## 2021-07-22 DIAGNOSIS — E119 Type 2 diabetes mellitus without complications: Secondary | ICD-10-CM | POA: Insufficient documentation

## 2021-07-22 DIAGNOSIS — D649 Anemia, unspecified: Secondary | ICD-10-CM | POA: Insufficient documentation

## 2021-07-22 LAB — CBC WITH DIFFERENTIAL (CANCER CENTER ONLY)
Abs Immature Granulocytes: 0.02 10*3/uL (ref 0.00–0.07)
Basophils Absolute: 0 10*3/uL (ref 0.0–0.1)
Basophils Relative: 0 %
Eosinophils Absolute: 0.2 10*3/uL (ref 0.0–0.5)
Eosinophils Relative: 2 %
HCT: 32 % — ABNORMAL LOW (ref 36.0–46.0)
Hemoglobin: 10.4 g/dL — ABNORMAL LOW (ref 12.0–15.0)
Immature Granulocytes: 0 %
Lymphocytes Relative: 25 %
Lymphs Abs: 2.2 10*3/uL (ref 0.7–4.0)
MCH: 29.5 pg (ref 26.0–34.0)
MCHC: 32.5 g/dL (ref 30.0–36.0)
MCV: 90.9 fL (ref 80.0–100.0)
Monocytes Absolute: 0.7 10*3/uL (ref 0.1–1.0)
Monocytes Relative: 8 %
Neutro Abs: 5.6 10*3/uL (ref 1.7–7.7)
Neutrophils Relative %: 65 %
Platelet Count: 269 10*3/uL (ref 150–400)
RBC: 3.52 MIL/uL — ABNORMAL LOW (ref 3.87–5.11)
RDW: 14.3 % (ref 11.5–15.5)
WBC Count: 8.7 10*3/uL (ref 4.0–10.5)
nRBC: 0 % (ref 0.0–0.2)

## 2021-07-22 LAB — CMP (CANCER CENTER ONLY)
ALT: 12 U/L (ref 0–44)
AST: 21 U/L (ref 15–41)
Albumin: 3.9 g/dL (ref 3.5–5.0)
Alkaline Phosphatase: 53 U/L (ref 38–126)
Anion gap: 4 — ABNORMAL LOW (ref 5–15)
BUN: 13 mg/dL (ref 8–23)
CO2: 26 mmol/L (ref 22–32)
Calcium: 9.1 mg/dL (ref 8.9–10.3)
Chloride: 106 mmol/L (ref 98–111)
Creatinine: 1.06 mg/dL — ABNORMAL HIGH (ref 0.44–1.00)
GFR, Estimated: 54 mL/min — ABNORMAL LOW (ref >60)
Glucose, Bld: 91 mg/dL (ref 70–99)
Potassium: 4.3 mmol/L (ref 3.5–5.1)
Sodium: 136 mmol/L (ref 135–145)
Total Bilirubin: 0.6 mg/dL (ref 0.3–1.2)
Total Protein: 7 g/dL (ref 6.5–8.1)

## 2021-07-22 MED ORDER — IOHEXOL 300 MG/ML  SOLN
100.0000 mL | Freq: Once | INTRAMUSCULAR | Status: AC | PRN
  Administered 2021-07-22: 100 mL via INTRAVENOUS

## 2021-07-23 ENCOUNTER — Other Ambulatory Visit: Payer: Self-pay

## 2021-07-25 ENCOUNTER — Other Ambulatory Visit: Payer: Self-pay

## 2021-07-25 ENCOUNTER — Inpatient Hospital Stay (HOSPITAL_BASED_OUTPATIENT_CLINIC_OR_DEPARTMENT_OTHER): Payer: Medicare Other | Admitting: Hematology

## 2021-07-25 ENCOUNTER — Encounter: Payer: Self-pay | Admitting: Hematology

## 2021-07-25 VITALS — BP 130/69 | HR 66 | Temp 98.0°F | Resp 17 | Wt 184.2 lb

## 2021-07-25 DIAGNOSIS — C49A2 Gastrointestinal stromal tumor of stomach: Secondary | ICD-10-CM | POA: Diagnosis not present

## 2021-07-25 DIAGNOSIS — Z79899 Other long term (current) drug therapy: Secondary | ICD-10-CM | POA: Diagnosis not present

## 2021-07-25 DIAGNOSIS — C49A0 Gastrointestinal stromal tumor, unspecified site: Secondary | ICD-10-CM | POA: Diagnosis not present

## 2021-07-25 DIAGNOSIS — E785 Hyperlipidemia, unspecified: Secondary | ICD-10-CM | POA: Diagnosis not present

## 2021-07-25 DIAGNOSIS — M1990 Unspecified osteoarthritis, unspecified site: Secondary | ICD-10-CM | POA: Diagnosis not present

## 2021-07-25 DIAGNOSIS — K219 Gastro-esophageal reflux disease without esophagitis: Secondary | ICD-10-CM | POA: Diagnosis not present

## 2021-07-25 DIAGNOSIS — E119 Type 2 diabetes mellitus without complications: Secondary | ICD-10-CM | POA: Diagnosis not present

## 2021-07-25 DIAGNOSIS — I10 Essential (primary) hypertension: Secondary | ICD-10-CM | POA: Diagnosis not present

## 2021-07-25 DIAGNOSIS — D649 Anemia, unspecified: Secondary | ICD-10-CM | POA: Diagnosis not present

## 2021-07-25 NOTE — Progress Notes (Signed)
[name]   [phone] [phone]   Clinic Follow up Note   Patient Care Team: Ngetich, Nelda Bucks, NP as PCP - General (Family Medicine) Shirley Muscat Loreen Freud, MD as Referring Physician (Optometry) Latanya Maudlin, MD as Consulting Physician (Orthopedic Surgery) Warden Fillers, MD as Consulting Physician (Ophthalmology) Ladene Artist, MD as Consulting Physician (Gastroenterology) Abbie Sons, MD (Urology) Stechschulte, Nickola Major, MD as Consulting Physician (Surgery) Truitt Merle, MD as Consulting Physician (Hematology)  Date of Service:  07/25/2021  CHIEF COMPLAINT: f/u of GIST  CURRENT THERAPY:  [place], started 03/09/21  ASSESSMENT & PLAN:  Alison [name] is a 76 y.o. female with   1. 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 Work up showed acute anemia and large gastric mass on CT AP on 11/14/20. Gastric mass not evident on 12/03/20 endoscopy. S/p partial gastrectomy 01/14/21 by Dr. 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 No lymph nodes were evaluated.  -she has high risk of recurrence (86%) due to the large size and high grade.  -staging chest CT 02/17/21 negative for metastatic disease. 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 She is tolerating well so far with mild to moderate diarrhea, improved with Imodium. -Due to her worsening epigastric pain after taking imatinib, she has stopped imatinib in late 04/2021. Her abdominal pain resolved. -she restarted Gleevec at 100 mg on 05/22/21 and gradually increased to 300 mg daily in June -She is tolerating 300 mg daily well, will continue. -I reviewed her restaging CT scan from July 23, 2021.  It showed postsurgical change, very small nodule along the margin of the resection site measuring 8 mm,  likely related to surgery.  No other evidence of recurrence.  I will obtain a repeat CT scan in 3 months for close follow-up. -We will see her back in 6 weeks for follow-up.  2. Anemia -she developed mild anemia at PCP visit 09/2020 -Hgb trended down, lowest 6.8 01/2021 after surgery. She received 2 units pRBCs with surgery -we previously discussed patients s/p gastric surgeries are at risk for B12 deficiency. -iron panel and ferritin on 05/22/21 were WNL.  -overall stable    4. HTN, HL, DM, GERD, OA  -meds and f/up per PCP. 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 She requests a refill, as her orthopedist will not. I called in celebrex and advised her not to take it for several days around her scan.   5. Social -Pt is single, lives with her daughter. Does not drive -Otherwise independent with ADLs.  -I previously referred her to social work     PLAN: -continue imatinib at 375m daily  -lab and f/u in 6 weeks    No problem-specific Assessment & Plan notes found for this encounter.   INTERVAL HISTORY:  BFloetta Brickeyis here for a follow up of GIST. She was last seen by me on 06/30/2021. She is compliant with Gleevec, and now taking 3064mdaily. She has formed BM 3-4 times a days. She has epigastric pain, intermittent, worse after eating. Overall stable manageable.  She has no other new complains   All other systems were reviewed with the patient and are negative.  MEDICAL HISTORY:  Past Medical History:  Diagnosis Date   Anxiety    Benign  essential hypertension    Blood transfusion without reported diagnosis    Cataract    removed bilateraly   Diabetes mellitus without complication ([place])    Diverticulitis of colon (without mention of hemorrhage)(562.11)    GERD (gastroesophageal reflux disease)    Goiter, specified as simple    Helicobacter pylori gastritis    Hyperlipidemia LDL goal < 100    Intrahepatic bile duct  dilation    Numbness    TOES RT FOOT and toes left foot   Obesity, unspecified    Osteoarthrosis, unspecified whether generalized or localized, lower leg    Osteopenia    Other specified disease of nail    FUNGUS OF FINGERNAILS   Pernicious anemia    Unspecified glaucoma(365.9)    Unspecified vitamin D deficiency    Wears dentures    Wears glasses     SURGICAL HISTORY: Past Surgical History:  Procedure Laterality Date   BACK SURGERY     CATARACT EXTRACTION     right eye 06/20/15 left eye 09/12/15   COLECTOMY  2009   COLONOSCOPY  09/06/2007   Dr Carol Ada   DILATION AND CURETTAGE OF UTERUS     ENDOSCOPIC RETROGRADE CHOLANGIOPANCREATOGRAPHY (ERCP) WITH PROPOFOL N/A 03/17/2019   Procedure: ENDOSCOPIC RETROGRADE CHOLANGIOPANCREATOGRAPHY (ERCP) WITH PROPOFOL;  Surgeon: Ladene Artist, MD;  [facility] ENDOSCOPY;  Service: Endoscopy;  Laterality: N/A;   JOINT REPLACEMENT     right   LUMBAR LAMINECTOMY/DECOMPRESSION MICRODISCECTOMY N/A 03/30/2013   Procedure: CENTRAL DECOMPRESSION L4 - L5 AND EXCISION 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;  Surgeon: Tobi Bastos, MD;  [facility];  Service: Orthopedics;  Laterality: N/A;   MULTIPLE TOOTH EXTRACTIONS     REMOVAL OF STONES  03/17/2019   Procedure: REMOVAL OF STONES;  Surgeon: Ladene Artist, MD;  Location: [place];  Service: Endoscopy;;   SIGMOIDOSCOPY  02/16/2018   Dr Nira Conn  03/17/2019   Procedure: SPHINCTEROTOMY;  Surgeon: Ladene Artist, MD;  Location: [place];  Service: Endoscopy;;   TOTAL KNEE ARTHROPLASTY Right 02/01/2013   Procedure: RIGHT TOTAL KNEE ARTHROPLASTY;  Surgeon: Tobi Bastos, MD;  [facility];  Service: Orthopedics;  Laterality: Right;   TOTAL KNEE ARTHROPLASTY Left 04/18/2014   Procedure: LEFT TOTAL KNEE ARTHROPLASTY;  Surgeon: Latanya Maudlin, MD;  [facility];  Service: Orthopedics;  Laterality: Left;    I have reviewed the social history and family history with the patient  and they are unchanged from previous note.  ALLERGIES:  is allergic to aspirin.  MEDICATIONS:  Current Outpatient Medications  Medication Sig Dispense Refill   acetaminophen (TYLENOL) 650 MG CR tablet Take 1,300 mg by mouth as needed.     amLODipine (NORVASC) 5 MG tablet Take 1 tablet by mouth once daily 90 tablet 1   aspirin EC 81 MG tablet Take 81 mg by mouth daily.     atorvastatin (LIPITOR) 40 MG tablet TAKE 1 TABLET BY MOUTH ONCE DAILY AT  6:00 PM 90 tablet 0   Calcium Carb-Cholecalciferol (CALCIUM 600 + D PO) Take 1 tablet by mouth in the morning.     carboxymethylcellulose (REFRESH PLUS) 0.5 % SOLN Place 1 drop into both eyes 3 (three) times daily as needed (dry eyes).      celecoxib (CELEBREX) 200 MG capsule Take 1 capsule (200 mg total) by mouth daily as needed. 30 capsule 1   Cholecalciferol (VITAMIN D-3) 1000 UNITS CAPS Take 1,000 Units by mouth in the morning.  dorzolamide-timolol (COSOPT) 22.3-6.8 MG/ML ophthalmic solution Place 1 drop into both eyes 2 (two) times daily.     gabapentin (NEURONTIN) 300 MG capsule Take 1 capsule by mouth at bedtime 90 capsule 1   imatinib (GLEEVEC) 100 MG tablet Take 3 tablets (300 mg total) by mouth daily. Take with meals and large glass of water.Caution:Chemotherapy 90 tablet 1   latanoprost (XALATAN) 0.005 % ophthalmic solution Place 1 drop into both eyes at bedtime.     Lidocaine HCl 4 % CREA Apply 1 application topically 2 (two) times daily as needed (knee pain.).     lisinopril (ZESTRIL) 30 MG tablet Take 1 tablet by mouth once daily 90 tablet 0   metFORMIN (GLUCOPHAGE) 500 MG tablet TAKE 1/2 (ONE-HALF) TABLET BY MOUTH IN THE EVENING WITH SUPPER 45 tablet 2   Multiple Vitamin (MULTIVITAMIN WITH MINERALS) TABS tablet Take 1 tablet by mouth daily.     nystatin cream (MYCOSTATIN) Apply 1 Application topically 2 (two) times daily. 30 g 0   Omega-3 Fatty Acids (OMEGA 3 PO) Take 1,200 mg by mouth in the morning.     ondansetron (ZOFRAN) 8 MG  tablet Take 1 tablet (8 mg total) by mouth every 8 (eight) hours as needed for nausea or vomiting. 20 tablet 2   pantoprazole (PROTONIX) 40 MG tablet Take 1 tablet (40 mg total) by mouth daily. 30 tablet 11   Zinc Oxide (DESITIN EX) Apply 1 application topically as needed.     No current facility-administered medications for this visit.    PHYSICAL EXAMINATION: ECOG PERFORMANCE STATUS: 1  There were no vitals filed for this visit.  Wt Readings from Last 3 Encounters:  07/10/21 183 lb 3.2 oz (83.1 kg)  06/30/21 181 lb 11.2 oz (82.4 kg)  05/22/21 181 lb 8 oz (82.3 kg)     GENERAL:alert, no distress and comfortable SKIN: skin color normal, no rashes or significant lesions EYES: normal, Conjunctiva are pink and non-injected, sclera clear  NEURO: alert & oriented x 3 with fluent speech  LABORATORY DATA:  I have reviewed the data as listed    Latest Ref Rng & Units 07/22/2021   11:14 AM 06/30/2021   10:38 AM 05/22/2021   10:35 AM  CBC  WBC 4.0 - 10.5 K/uL 8.7  9.1  8.0   Hemoglobin 12.0 - 15.0 g/dL 10.4  10.6  9.8   Hematocrit 36.0 - 46.0 % 32.0  32.6  30.3   Platelets 150 - 400 K/uL 269  325  278         Latest Ref Rng & Units 07/22/2021   11:14 AM 06/30/2021   10:38 AM 05/22/2021   10:35 AM  CMP  Glucose 70 - 99 mg/dL 91  98  106   BUN 8 - 23 mg/dL _0 Creatinine 0.44 - 1.00 mg/dL 1.06  1.02  0.84   Sodium 135 - 145 mmol/L 136  136  137   Potassium 3.5 - 5.1 mmol/L 4.3  4.4  4.0   Chloride 98 - 111 mmol/L 106  106  106   CO2 22 - 32 mmol/L _1 Calcium 8.9 - 10.3 mg/dL 9.1  9.3  9.2   Total Protein 6.5 - 8.1 g/dL 7.0  7.1  7.5   Total Bilirubin 0.3 - 1.2 mg/dL 0.6  0.4  0.5   Alkaline Phos 38 - 126 U/L 53  58  64   AST 15 - 41  U/L _0 ALT 0 - 44 U/L _1 RADIOGRAPHIC STUDIES: I have personally reviewed the radiological images as listed and agreed with the findings in the report. No results found.    No orders of the defined  types were placed in this encounter.  All questions were answered. The patient knows to call the clinic with any problems, questions or concerns. No barriers to learning was detected. The total time spent in the appointment was 30 minutes.     Truitt Merle, MD 07/25/2021

## 2021-07-28 ENCOUNTER — Other Ambulatory Visit: Payer: Self-pay | Admitting: Family

## 2021-07-28 DIAGNOSIS — E1149 Type 2 diabetes mellitus with other diabetic neurological complication: Secondary | ICD-10-CM

## 2021-08-12 ENCOUNTER — Other Ambulatory Visit: Payer: Self-pay | Admitting: Family

## 2021-08-12 DIAGNOSIS — I10 Essential (primary) hypertension: Secondary | ICD-10-CM

## 2021-08-12 DIAGNOSIS — E1142 Type 2 diabetes mellitus with diabetic polyneuropathy: Secondary | ICD-10-CM

## 2021-08-26 ENCOUNTER — Other Ambulatory Visit: Payer: Self-pay

## 2021-08-26 ENCOUNTER — Other Ambulatory Visit: Payer: Self-pay | Admitting: Family

## 2021-08-26 DIAGNOSIS — C49A0 Gastrointestinal stromal tumor, unspecified site: Secondary | ICD-10-CM

## 2021-08-26 DIAGNOSIS — E1169 Type 2 diabetes mellitus with other specified complication: Secondary | ICD-10-CM

## 2021-08-26 MED ORDER — IMATINIB MESYLATE 100 MG PO TABS: 300.0000 mg | ORAL_TABLET | Freq: Every day | ORAL | 1 refills | Status: DC

## 2021-08-29 ENCOUNTER — Encounter: Payer: Self-pay | Admitting: Family

## 2021-08-29 DIAGNOSIS — Z1231 Encounter for screening mammogram for malignant neoplasm of breast: Secondary | ICD-10-CM | POA: Diagnosis not present

## 2021-08-29 LAB — HM MAMMOGRAPHY

## 2021-08-30 IMAGING — RF DG ERCP WO/W SPHINCTEROTOMY
1 series · 10 of 10 positions shown · non-contrast
Comparison: None.

CLINICAL DATA: 73-year-old female with the history biliary
obstruction

EXAM:
ERCP
TECHNIQUE: Multiple spot images obtained with the fluoroscopic device and
submitted for interpretation post-procedure.
FLUOROSCOPY TIME:  Fluoroscopy Time: 1 minutes 18 seconds

[Series 1: unknown protocol · 0.20mm/px · 10 of 10 slices shown]
[im 1/10]
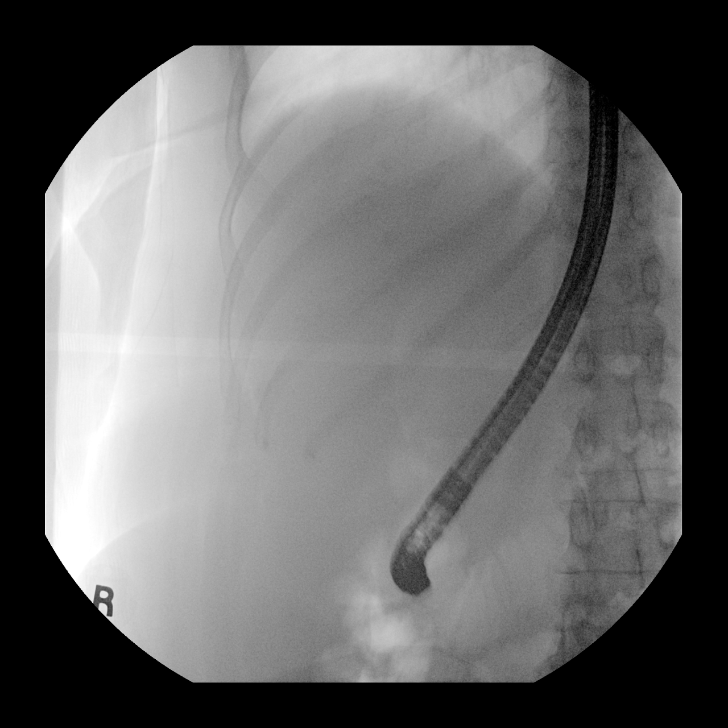
[im 2/10]
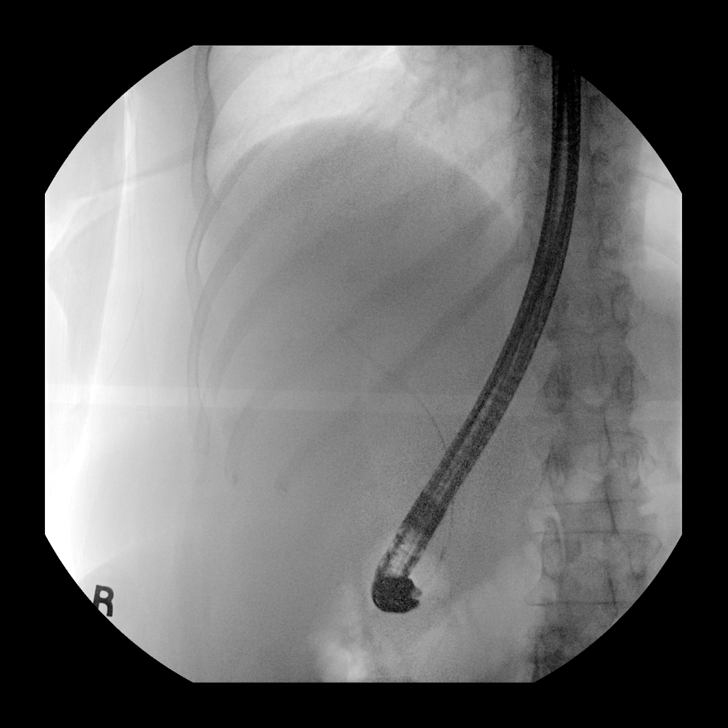
[im 3/10]
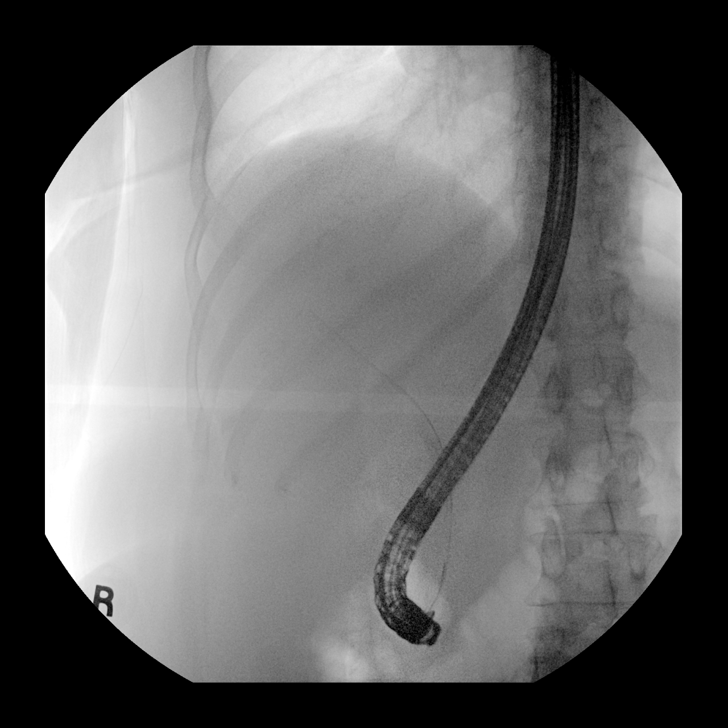
[im 4/10]
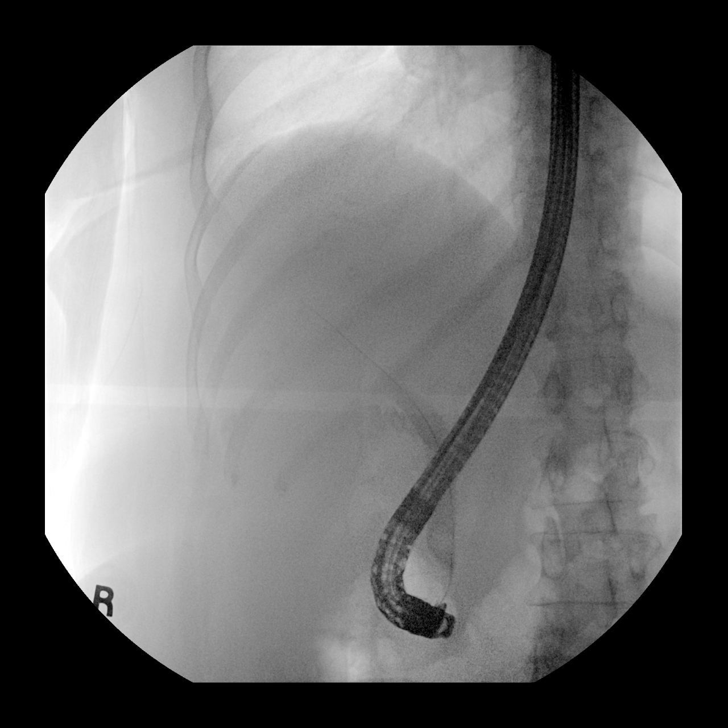
[im 5/10]
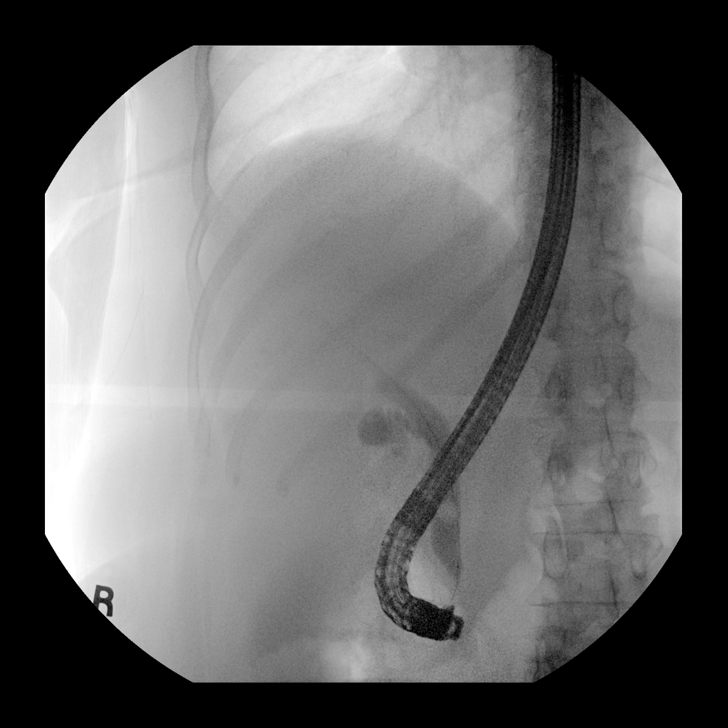
[im 6/10]
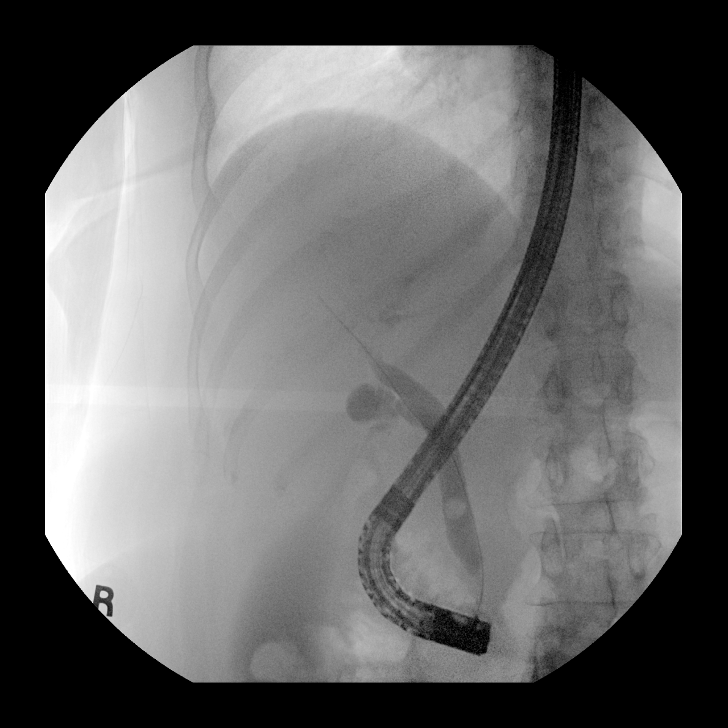
[im 7/10]
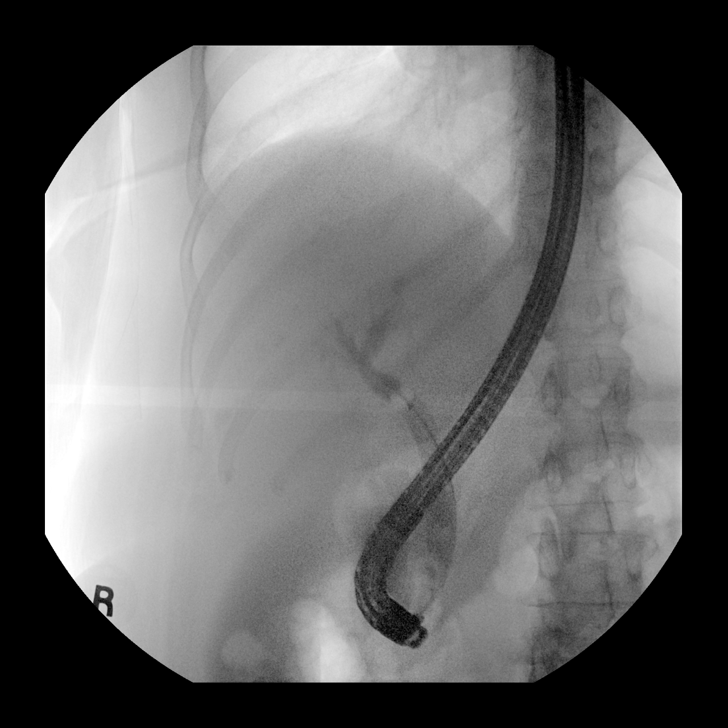
[im 8/10]
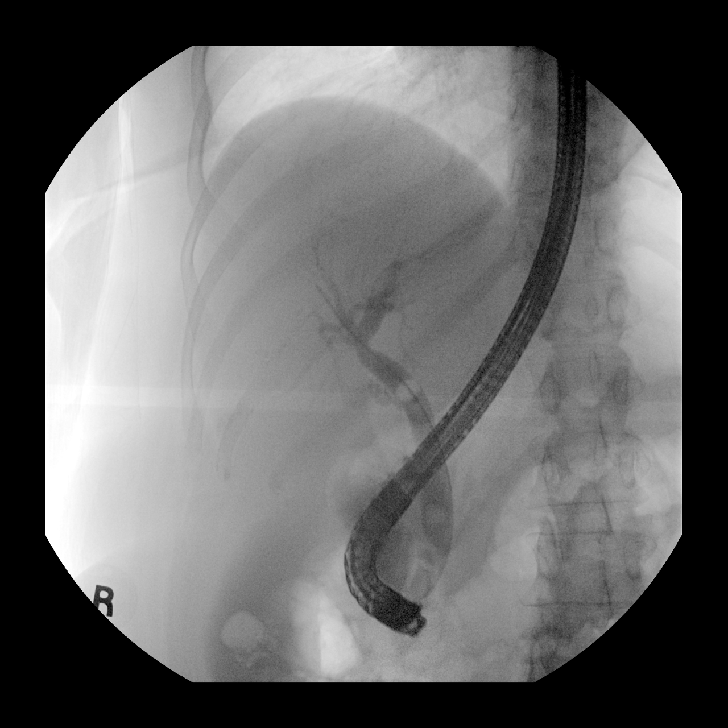
[im 9/10]
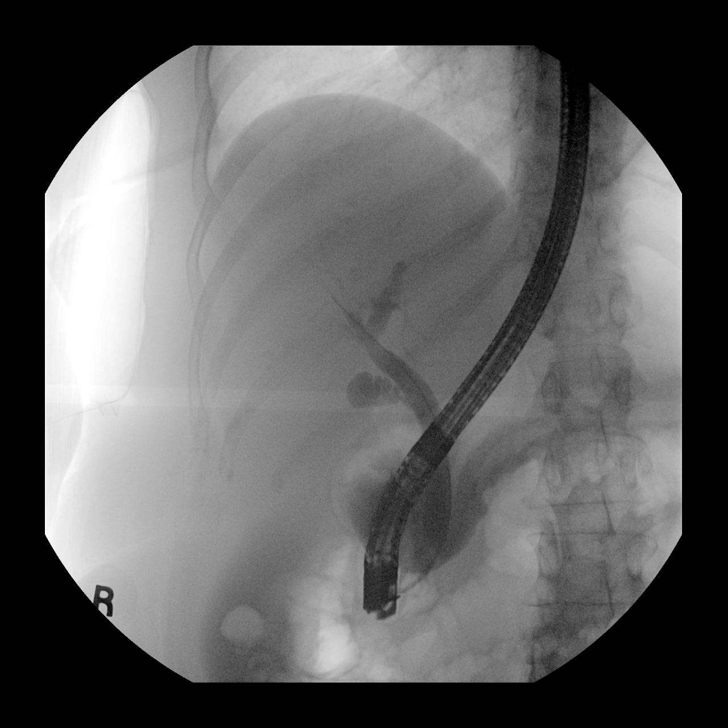
[im 10/10]
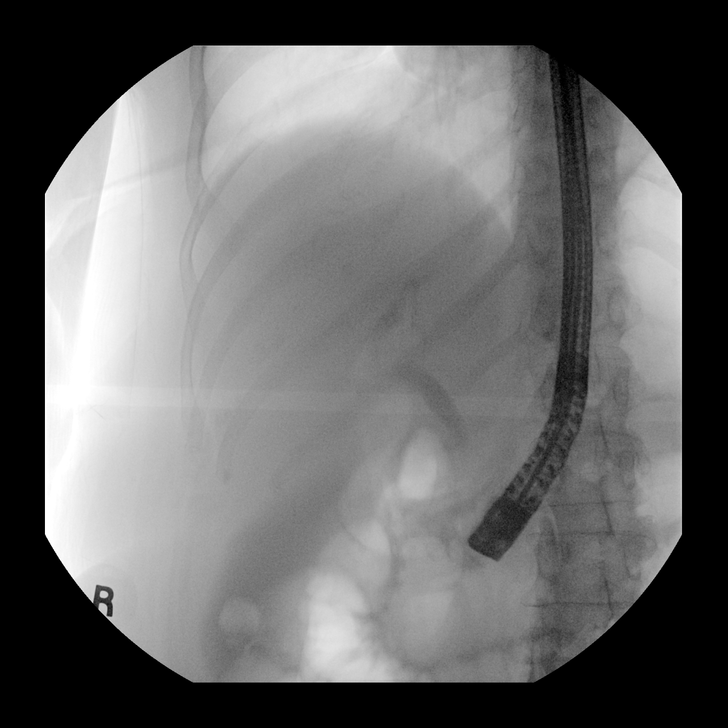

[10 of 10 positions shown; findings below may reference images not displayed]

FINDINGS: Limited intraoperative fluoroscopic spot images during ERCP.

Initial image demonstrates endoscope projecting over the upper
abdomen. There is cannulation of the ampulla and retrograde infusion
of contrast. Deployment of a retrieval balloon demonstrated.
IMPRESSION: Limited images during ERCP demonstrates treatment of biliary
debris/choledocholithiasis with deployment of balloon retrieval
catheter. Please refer to the dictated operative report for full
details of intraoperative findings and procedure.

## 2021-09-04 ENCOUNTER — Ambulatory Visit (INDEPENDENT_AMBULATORY_CARE_PROVIDER_SITE_OTHER): Payer: Medicare Other | Admitting: Adult Health

## 2021-09-04 ENCOUNTER — Inpatient Hospital Stay: Payer: Medicare Other | Admitting: Hematology

## 2021-09-04 ENCOUNTER — Ambulatory Visit: Payer: Medicare Other | Admitting: Adult Health

## 2021-09-04 ENCOUNTER — Encounter: Payer: Self-pay | Admitting: Adult Health

## 2021-09-04 ENCOUNTER — Inpatient Hospital Stay: Payer: Medicare Other

## 2021-09-04 ENCOUNTER — Ambulatory Visit: Payer: Medicare Other | Admitting: Family

## 2021-09-04 VITALS — BP 122/68 | HR 84 | Temp 97.1°F | Wt 178.8 lb

## 2021-09-04 DIAGNOSIS — E1149 Type 2 diabetes mellitus with other diabetic neurological complication: Secondary | ICD-10-CM | POA: Diagnosis not present

## 2021-09-04 DIAGNOSIS — R051 Acute cough: Secondary | ICD-10-CM | POA: Diagnosis not present

## 2021-09-04 DIAGNOSIS — N1831 Chronic kidney disease, stage 3a: Secondary | ICD-10-CM

## 2021-09-04 DIAGNOSIS — I10 Essential (primary) hypertension: Secondary | ICD-10-CM

## 2021-09-04 NOTE — Progress Notes (Signed)
[facility] clinic  Provider: Durenda Age DNP  Code Status:  Full Code  Goals of Care:     07/10/2021    2:20 PM  Advanced Directives  Does Patient Have a Medical Advance Directive? No  Would patient like information on creating a medical advance directive? No - Patient declined     Chief Complaint  Patient presents with   Acute Visit    Complains of Cold Sx. No Fever and No Covid Exposure. Requesting Covid Testing.     HPI: Patient is a 76 y.o. female seen today for an acute visit for cough. She follows up at the [facility] for gastrointestinal stromal tumor of stomach and was not seen today due to cough. She has productive cough with whitish phlegm and takes Mucinex. Cough started 2-3 days ago. She is here today requesting to get tested for COVID-19. She denies having known exposure to COVID-19. She denies sore throat, chills, SOB, fever nor poor appetite.   BP today is 122/78, takes Amlodipine 5 mg daily and Lisinopril 30 mg daily for hypertension.  She takes Metformin 500 mg takes half tab daily for diabetes mellitus. She does not check her blood sugar. Last A1c 5.6, 03/28/21  Past Medical History:  Diagnosis Date   Anxiety    Benign essential hypertension    Blood transfusion without reported diagnosis    Cataract    removed bilateraly   Diabetes mellitus without complication ([place])    Diverticulitis of colon (without mention of hemorrhage)(562.11)    GERD (gastroesophageal reflux disease)    Goiter, specified as simple    Helicobacter pylori gastritis    Hyperlipidemia LDL goal < 100    Intrahepatic bile duct dilation    Numbness    TOES RT FOOT and toes left foot   Obesity, unspecified    Osteoarthrosis, unspecified whether generalized or localized, lower leg    Osteopenia    Other specified disease of nail    FUNGUS OF FINGERNAILS   Pernicious anemia    Unspecified glaucoma(365.9)    Unspecified vitamin D deficiency    Wears dentures    Wears glasses      Past Surgical History:  Procedure Laterality Date   BACK SURGERY     CATARACT EXTRACTION     right eye 06/20/15 left eye 09/12/15   COLECTOMY  2009   COLONOSCOPY  09/06/2007   Dr Carol Ada   DILATION AND CURETTAGE OF UTERUS     ENDOSCOPIC RETROGRADE CHOLANGIOPANCREATOGRAPHY (ERCP) WITH PROPOFOL N/A 03/17/2019   Procedure: ENDOSCOPIC RETROGRADE CHOLANGIOPANCREATOGRAPHY (ERCP) WITH PROPOFOL;  Surgeon: Ladene Artist, MD;  Location: [place];  Service: Endoscopy;  Laterality: N/A;   JOINT REPLACEMENT     right   LUMBAR LAMINECTOMY/DECOMPRESSION MICRODISCECTOMY N/A 03/30/2013   Procedure: CENTRAL DECOMPRESSION L4 - L5 AND EXCISION 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;  Surgeon: Tobi Bastos, MD;  Location[facility];  Service: Orthopedics;  Laterality: N/A;   MULTIPLE TOOTH EXTRACTIONS     REMOVAL OF STONES  03/17/2019   Procedure: REMOVAL OF STONES;  Surgeon: Ladene Artist, MD;  Location: [place];  Service: Endoscopy;;   SIGMOIDOSCOPY  02/16/2018   Dr Nira Conn  03/17/2019   Procedure: SPHINCTEROTOMY;  Surgeon: Ladene Artist, MD;  Location: [place];  Service: Endoscopy;;   TOTAL KNEE ARTHROPLASTY Right 02/01/2013   Procedure: RIGHT TOTAL KNEE ARTHROPLASTY;  Surgeon: Tobi Bastos, MD;  Location[facility];  Service: Orthopedics;  Laterality: Right;  TOTAL KNEE ARTHROPLASTY Left 04/18/2014   Procedure: LEFT TOTAL KNEE ARTHROPLASTY;  Surgeon: Latanya Maudlin, MD;  [facility];  Service: Orthopedics;  Laterality: Left;    Allergies  Allergen Reactions   Aspirin Nausea Only and Nausea And Vomiting    Can take 81 mg but can't take 357m, hard on stomach. Can take 81 mg but can't take 3229m hard on stomach.    Outpatient Encounter Medications as of 09/04/2021  Medication Sig   acetaminophen (TYLENOL) 650 MG CR tablet Take 1,300 mg by mouth as needed.   amLODipine (NORVASC) 5 MG tablet Take 1 tablet by mouth once daily   aspirin EC 81 MG tablet Take 81 mg by  mouth daily.   atorvastatin (LIPITOR) 40 MG tablet TAKE 1 TABLET BY MOUTH ONCE DAILY AT  6PM   Calcium Carb-Cholecalciferol (CALCIUM 600 + D PO) Take 1 tablet by mouth in the morning.   carboxymethylcellulose (REFRESH PLUS) 0.5 % SOLN Place 1 drop into both eyes 3 (three) times daily as needed (dry eyes).    celecoxib (CELEBREX) 200 MG capsule Take 1 capsule (200 mg total) by mouth daily as needed.   Cholecalciferol (VITAMIN D-3) 1000 UNITS CAPS Take 1,000 Units by mouth in the morning.   dextromethorphan-guaiFENesin (MUCINEX DM) 30-600 MG 12hr tablet Take 1 tablet by mouth 2 (two) times daily.   dorzolamide-timolol (COSOPT) 22.3-6.8 MG/ML ophthalmic solution Place 1 drop into both eyes 2 (two) times daily.   gabapentin (NEURONTIN) 300 MG capsule Take 1 capsule by mouth at bedtime   imatinib (GLEEVEC) 100 MG tablet Take 3 tablets (300 mg total) by mouth daily. Take with meals and large glass of water.Caution:Chemotherapy   latanoprost (XALATAN) 0.005 % ophthalmic solution Place 1 drop into both eyes at bedtime.   Lidocaine HCl 4 % CREA Apply 1 application topically 2 (two) times daily as needed (knee pain.).   lisinopril (ZESTRIL) 30 MG tablet Take 1 tablet by mouth once daily   metFORMIN (GLUCOPHAGE) 500 MG tablet TAKE 1/2 (ONE-HALF) TABLET BY MOUTH IN THE EVENING WITH SUPPER   Multiple Vitamin (MULTIVITAMIN WITH MINERALS) TABS tablet Take 1 tablet by mouth daily.   Omega-3 Fatty Acids (OMEGA 3 PO) Take 1,200 mg by mouth in the morning.   ondansetron (ZOFRAN) 8 MG tablet Take 1 tablet (8 mg total) by mouth every 8 (eight) hours as needed for nausea or vomiting.   pantoprazole (PROTONIX) 40 MG tablet Take 1 tablet (40 mg total) by mouth daily.   Zinc Oxide (DESITIN EX) Apply 1 application topically as needed.   nystatin cream (MYCOSTATIN) Apply 1 Application topically 2 (two) times daily. (Patient not taking: Reported on 09/04/2021)   No facility-administered encounter medications on file as of  09/04/2021.    Review of Systems:  Review of Systems  Constitutional:  Negative for appetite change, chills, fatigue and fever.  HENT:  Negative for congestion, hearing loss, rhinorrhea and sore throat.   Eyes: Negative.   Respiratory:  Positive for cough. Negative for shortness of breath and wheezing.   Cardiovascular:  Negative for chest pain, palpitations and leg swelling.  Gastrointestinal:  Negative for abdominal pain, constipation, diarrhea, nausea and vomiting.  Genitourinary:  Negative for dysuria.  Musculoskeletal:  Negative for arthralgias, back pain and myalgias.  Skin:  Negative for color change, rash and wound.  Neurological:  Negative for dizziness, weakness and headaches.  Psychiatric/Behavioral:  Negative for behavioral problems. The patient is not nervous/anxious.     Health Maintenance  Topic  Date Due   COVID-19 Vaccine (7 - Pfizer risk series) 12/02/2020   INFLUENZA VACCINE  08/12/2021   OPHTHALMOLOGY EXAM  09/23/2021   HEMOGLOBIN A1C  09/28/2021   FOOT EXAM  02/19/2022   COLONOSCOPY (Pts 45-57yr Insurance coverage will need to be confirmed)  02/17/2023   TETANUS/TDAP  10/29/2023   Pneumonia Vaccine 76 Years old  Completed   DEXA SCAN  Completed   Hepatitis C Screening  Completed   Zoster Vaccines- Shingrix  Completed   HPV VACCINES  Aged Out    Physical Exam: Vitals:   09/04/21 1449  BP: 122/68  Pulse: 84  Temp: (!) 97.1 F (36.2 C)  TempSrc: Skin  SpO2: 97%  Weight: 178 lb 12.8 oz (81.1 kg)   Body mass index is 29.75 kg/m. Physical Exam Constitutional:      General: She is not in acute distress.    Appearance: Normal appearance.  HENT:     Head: Normocephalic and atraumatic.     Nose: Nose normal.     Mouth/Throat:     Mouth: Mucous membranes are moist.  Eyes:     Conjunctiva/sclera: Conjunctivae normal.  Cardiovascular:     Rate and Rhythm: Normal rate and regular rhythm.  Pulmonary:     Effort: Pulmonary effort is normal.      Breath sounds: Normal breath sounds.  Abdominal:     General: Bowel sounds are normal.     Palpations: Abdomen is soft.  Musculoskeletal:        General: Normal range of motion.     Cervical back: Normal range of motion.  Skin:    General: Skin is warm and dry.  Neurological:     General: No focal deficit present.     Mental Status: She is alert and oriented to person, place, and time.  Psychiatric:        Mood and Affect: Mood normal.        Behavior: Behavior normal.        Thought Content: Thought content normal.        Judgment: Judgment normal.     Labs reviewed: Basic Metabolic Panel: Recent Labs    03/28/21 1001 04/01/21 1545 05/22/21 1035 06/30/21 1038 07/22/21 1114  NA  --    < > 137 136 136  K  --    < > 4.0 4.4 4.3  CL  --    < > 106 106 106  CO2  --    < > _0 GLUCOSE  --    < > 106* 98 91  BUN  --    < > _1 CREATININE  --    < > 0.84 1.02* 1.06*  CALCIUM  --    < > 9.2 9.3 9.1  TSH 0.977  --   --   --   --    < > = values in this interval not displayed.   Liver Function Tests: Recent Labs    05/22/21 1035 06/30/21 1038 07/22/21 1114  AST _2 ALT _3 ALKPHOS 64 58 53  BILITOT 0.5 0.4 0.6  PROT 7.5 7.1 7.0  ALBUMIN 3.9 3.8 3.9   Recent Labs    11/14/20 2054  LIPASE 19   No results for input(s): "AMMONIA" in the last 8760 hours. CBC: Recent Labs    05/22/21 1035 06/30/21 1038 07/22/21 1114  WBC 8.0 9.1 8.7  NEUTROABS 4.8 6.3 5.6  HGB 9.8*  10.6* 10.4*  HCT 30.3* 32.6* 32.0*  MCV 91.0 90.6 90.9  PLT 278 325 269   Lipid Panel: Recent Labs    09/23/20 0850 03/28/21 1002  CHOL 122 116  HDL 38* 44  LDLCALC 64 59  TRIG 113 65  CHOLHDL 3.2 2.6   Lab Results  Component Value Date   HGBA1C 5.6 03/28/2021    Procedures since last visit: No results found.  Assessment/Plan  1. Acute cough -  continue taking Mucinex - SARS-COV-2 RNA,(COVID-19) QUAL NAAT  2. Benign essential hypertension -Blood  pressure well controlled Continue current medications  3. Type 2 diabetes mellitus with neurological complications [facility]) Lab Results  Component Value Date   HGBA1C 5.6 03/28/2021   -  continue Metformin  4. Stage 3a chronic kidney disease (HCC) Lab Results  Component Value Date   NA 136 07/22/2021   K 4.3 07/22/2021   CO2 26 07/22/2021   GLUCOSE 91 07/22/2021   BUN 13 07/22/2021   CREATININE 1.06 (H) 07/22/2021   CALCIUM 9.1 07/22/2021   EGFR 59 (L) 01/22/2021   GFRNONAA 54 (L) 07/22/2021   -  stable    Labs/tests ordered:   SARS-COV-2 RNA,(COVID-19) QUAL NAAT  Next appt:  09/29/2021

## 2021-09-04 NOTE — Patient Instructions (Signed)

## 2021-09-05 LAB — SARS-COV-2 RNA,(COVID-19) QUALITATIVE NAAT: SARS CoV2 RNA: DETECTED — AB

## 2021-09-08 ENCOUNTER — Other Ambulatory Visit: Payer: Self-pay | Admitting: Adult Health

## 2021-09-08 MED ORDER — MOLNUPIRAVIR EUA 200MG CAPSULE: 4.0000 | ORAL_CAPSULE | Freq: Two times a day (BID) | ORAL | 0 refills | Status: AC

## 2021-09-08 NOTE — Progress Notes (Signed)
COVID-19 is positive and I sent e-prescription of Molnupiravir to pharmacy. Pls isolate X 5 days and mask X 10 days

## 2021-09-13 ENCOUNTER — Other Ambulatory Visit: Payer: Self-pay | Admitting: Family

## 2021-09-13 DIAGNOSIS — I10 Essential (primary) hypertension: Secondary | ICD-10-CM

## 2021-09-16 ENCOUNTER — Other Ambulatory Visit: Payer: Self-pay

## 2021-09-19 ENCOUNTER — Encounter: Payer: Self-pay | Admitting: Adult Health

## 2021-09-19 ENCOUNTER — Ambulatory Visit (INDEPENDENT_AMBULATORY_CARE_PROVIDER_SITE_OTHER): Payer: Medicare Other | Admitting: Adult Health

## 2021-09-19 DIAGNOSIS — E1149 Type 2 diabetes mellitus with other diabetic neurological complication: Secondary | ICD-10-CM

## 2021-09-19 DIAGNOSIS — B3731 Acute candidiasis of vulva and vagina: Secondary | ICD-10-CM | POA: Diagnosis not present

## 2021-09-19 DIAGNOSIS — B379 Candidiasis, unspecified: Secondary | ICD-10-CM | POA: Diagnosis not present

## 2021-09-19 DIAGNOSIS — I10 Essential (primary) hypertension: Secondary | ICD-10-CM

## 2021-09-19 DIAGNOSIS — N390 Urinary tract infection, site not specified: Secondary | ICD-10-CM

## 2021-09-19 DIAGNOSIS — R319 Hematuria, unspecified: Secondary | ICD-10-CM | POA: Diagnosis not present

## 2021-09-19 LAB — POCT URINALYSIS DIPSTICK
Glucose, UA: NEGATIVE
Ketones, UA: 5
Nitrite, UA: NEGATIVE
Protein, UA: POSITIVE — AB
Spec Grav, UA: >=1.03 — AB (ref 1.010–1.025)
Urobilinogen, UA: 0.2 E.U./dL
pH, UA: 6 (ref 5.0–8.0)

## 2021-09-19 MED ORDER — NYSTATIN 100000 UNIT/GM EX OINT: 1.0000 | TOPICAL_OINTMENT | Freq: Two times a day (BID) | CUTANEOUS | 1 refills | Status: DC

## 2021-09-19 NOTE — Patient Instructions (Signed)

## 2021-09-19 NOTE — Progress Notes (Unsigned)
[facility] clinic  Provider:   Code Status:  Full Code   Goals of Care:     07/10/2021    2:20 PM  Advanced Directives  Does Patient Have a Medical Advance Directive? No  Would patient like information on creating a medical advance directive? No - Patient declined     Chief Complaint  Patient presents with   Acute Visit    Patient is here for possible yeast infection    HPI: Patient is a 76 y.o. female seen today for an acute visit for  Past Medical History:  Diagnosis Date   Anxiety    Benign essential hypertension    Blood transfusion without reported diagnosis    Cataract    removed bilateraly   Diabetes mellitus without complication ([place])    Diverticulitis of colon (without mention of hemorrhage)(562.11)    GERD (gastroesophageal reflux disease)    Goiter, specified as simple    Helicobacter pylori gastritis    Hyperlipidemia LDL goal < 100    Intrahepatic bile duct dilation    Numbness    TOES RT FOOT and toes left foot   Obesity, unspecified    Osteoarthrosis, unspecified whether generalized or localized, lower leg    Osteopenia    Other specified disease of nail    FUNGUS OF FINGERNAILS   Pernicious anemia    Unspecified glaucoma(365.9)    Unspecified vitamin D deficiency    Wears dentures    Wears glasses     Past Surgical History:  Procedure Laterality Date   BACK SURGERY     CATARACT EXTRACTION     right eye 06/20/15 left eye 09/12/15   COLECTOMY  2009   COLONOSCOPY  09/06/2007   Dr Carol Ada   DILATION AND CURETTAGE OF UTERUS     ENDOSCOPIC RETROGRADE CHOLANGIOPANCREATOGRAPHY (ERCP) WITH PROPOFOL N/A 03/17/2019   Procedure: ENDOSCOPIC RETROGRADE CHOLANGIOPANCREATOGRAPHY (ERCP) WITH PROPOFOL;  Surgeon: Ladene Artist, MD;  Location: [place];  Service: Endoscopy;  Laterality: N/A;   JOINT REPLACEMENT     right   LUMBAR LAMINECTOMY/DECOMPRESSION MICRODISCECTOMY N/A 03/30/2013   Procedure: CENTRAL DECOMPRESSION L4 - L5 AND EXCISION OF SYNOVIAL  CYST ON THE LEFT;  Surgeon: Tobi Bastos, MD;  [facility];  Service: Orthopedics;  Laterality: N/A;   MULTIPLE TOOTH EXTRACTIONS     REMOVAL OF STONES  03/17/2019   Procedure: REMOVAL OF STONES;  Surgeon: Ladene Artist, MD;  Location: [place];  Service: Endoscopy;;   SIGMOIDOSCOPY  02/16/2018   Dr Nira Conn  03/17/2019   Procedure: SPHINCTEROTOMY;  Surgeon: Ladene Artist, MD;  Location: [place];  Service: Endoscopy;;   TOTAL KNEE ARTHROPLASTY Right 02/01/2013   Procedure: RIGHT TOTAL KNEE ARTHROPLASTY;  Surgeon: Tobi Bastos, MD;  [facility];  Service: Orthopedics;  Laterality: Right;   TOTAL KNEE ARTHROPLASTY Left 04/18/2014   Procedure: LEFT TOTAL KNEE ARTHROPLASTY;  Surgeon: Latanya Maudlin, MD;  Location[facility];  Service: Orthopedics;  Laterality: Left;    Allergies  Allergen Reactions   Aspirin Nausea Only and Nausea And Vomiting    Can take 81 mg but can't take '325mg'$ , hard on stomach. Can take 81 mg but can't take '325mg'$ , hard on stomach.    Outpatient Encounter Medications as of 09/19/2021  Medication Sig   acetaminophen (TYLENOL) 650 MG CR tablet Take 1,300 mg by mouth as needed.   amLODipine (NORVASC) 5 MG tablet Take 1 tablet by mouth once daily   aspirin  EC 81 MG tablet Take 81 mg by mouth daily.   atorvastatin (LIPITOR) 40 MG tablet TAKE 1 TABLET BY MOUTH ONCE DAILY AT  6PM   Calcium Carb-Cholecalciferol (CALCIUM 600 + D PO) Take 1 tablet by mouth in the morning.   carboxymethylcellulose (REFRESH PLUS) 0.5 % SOLN Place 1 drop into both eyes 3 (three) times daily as needed (dry eyes).    celecoxib (CELEBREX) 200 MG capsule Take 1 capsule (200 mg total) by mouth daily as needed.   Cholecalciferol (VITAMIN D-3) 1000 UNITS CAPS Take 1,000 Units by mouth in the morning.   dextromethorphan-guaiFENesin (MUCINEX DM) 30-600 MG 12hr tablet Take 1 tablet by mouth 2 (two) times daily.   dorzolamide-timolol (COSOPT) 22.3-6.8 MG/ML ophthalmic  solution Place 1 drop into both eyes 2 (two) times daily.   gabapentin (NEURONTIN) 300 MG capsule Take 1 capsule by mouth at bedtime   imatinib (GLEEVEC) 100 MG tablet Take 3 tablets (300 mg total) by mouth daily. Take with meals and large glass of water.Caution:Chemotherapy   latanoprost (XALATAN) 0.005 % ophthalmic solution Place 1 drop into both eyes at bedtime.   Lidocaine HCl 4 % CREA Apply 1 application topically 2 (two) times daily as needed (knee pain.).   lisinopril (ZESTRIL) 30 MG tablet Take 1 tablet by mouth once daily   metFORMIN (GLUCOPHAGE) 500 MG tablet TAKE 1/2 (ONE-HALF) TABLET BY MOUTH IN THE EVENING WITH SUPPER   Multiple Vitamin (MULTIVITAMIN WITH MINERALS) TABS tablet Take 1 tablet by mouth daily.   nystatin cream (MYCOSTATIN) Apply 1 Application topically 2 (two) times daily.   Omega-3 Fatty Acids (OMEGA 3 PO) Take 1,200 mg by mouth in the morning.   ondansetron (ZOFRAN) 8 MG tablet Take 1 tablet (8 mg total) by mouth every 8 (eight) hours as needed for nausea or vomiting.   pantoprazole (PROTONIX) 40 MG tablet Take 1 tablet (40 mg total) by mouth daily.   Zinc Oxide (DESITIN EX) Apply 1 application topically as needed.   No facility-administered encounter medications on file as of 09/19/2021.    Review of Systems:  Review of Systems  Constitutional:  Negative for appetite change, chills, fatigue and fever.  HENT:  Negative for congestion, hearing loss, rhinorrhea and sore throat.   Eyes: Negative.   Respiratory:  Negative for cough, shortness of breath and wheezing.   Cardiovascular:  Negative for chest pain, palpitations and leg swelling.  Gastrointestinal:  Negative for abdominal pain, constipation, diarrhea, nausea and vomiting.  Genitourinary:  Negative for dysuria.  Musculoskeletal:  Negative for arthralgias, back pain and myalgias.  Skin:  Negative for color change, rash and wound.  Neurological:  Negative for dizziness, weakness and headaches.   Psychiatric/Behavioral:  Negative for behavioral problems. The patient is not nervous/anxious.     Health Maintenance  Topic Date Due   COVID-19 Vaccine (7 - Pfizer risk series) 12/02/2020   INFLUENZA VACCINE  08/12/2021   OPHTHALMOLOGY EXAM  09/23/2021   HEMOGLOBIN A1C  09/28/2021   FOOT EXAM  02/19/2022   COLONOSCOPY (Pts 45-78yr Insurance coverage will need to be confirmed)  02/17/2023   TETANUS/TDAP  10/29/2023   Pneumonia Vaccine 76 Years old  Completed   DEXA SCAN  Completed   Hepatitis C Screening  Completed   Zoster Vaccines- Shingrix  Completed   HPV VACCINES  Aged Out    Physical Exam: Vitals:   09/19/21 1415  BP: 126/78  Pulse: 71  Resp: 20  Temp: (!) 97.2 F (36.2 C)  SpO2: 96%  Weight: 180 lb (81.6 kg)  Height: '5\' 5"'$  (1.651 m)   Body mass index is 29.95 kg/m. Physical Exam Constitutional:      Appearance: Normal appearance.  HENT:     Head: Normocephalic and atraumatic.     Nose: Nose normal.     Mouth/Throat:     Mouth: Mucous membranes are moist.  Eyes:     Conjunctiva/sclera: Conjunctivae normal.  Cardiovascular:     Rate and Rhythm: Normal rate and regular rhythm.  Pulmonary:     Effort: Pulmonary effort is normal.     Breath sounds: Normal breath sounds.  Abdominal:     General: Bowel sounds are normal.     Palpations: Abdomen is soft.  Musculoskeletal:        General: Normal range of motion.     Cervical back: Normal range of motion.  Skin:    General: Skin is warm and dry.     Findings: Rash present.     Comments: Left abdominal folds and bilateral labia majora   Neurological:     General: No focal deficit present.     Mental Status: She is alert and oriented to person, place, and time.  Psychiatric:        Mood and Affect: Mood normal.        Behavior: Behavior normal.        Thought Content: Thought content normal.        Judgment: Judgment normal.     Labs reviewed: Basic Metabolic Panel: Recent Labs    03/28/21 1001  04/01/21 1545 05/22/21 1035 06/30/21 1038 07/22/21 1114  NA  --    < > 137 136 136  K  --    < > 4.0 4.4 4.3  CL  --    < > 106 106 106  CO2  --    < > '28 28 26  '$ GLUCOSE  --    < > 106* 98 91  BUN  --    < > '11 12 13  '$ CREATININE  --    < > 0.84 1.02* 1.06*  CALCIUM  --    < > 9.2 9.3 9.1  TSH 0.977  --   --   --   --    < > = values in this interval not displayed.   Liver Function Tests: Recent Labs    05/22/21 1035 06/30/21 1038 07/22/21 1114  AST '19 17 21  '$ ALT '13 11 12  '$ ALKPHOS 64 58 53  BILITOT 0.5 0.4 0.6  PROT 7.5 7.1 7.0  ALBUMIN 3.9 3.8 3.9   Recent Labs    11/14/20 2054  LIPASE 19   No results for input(s): "AMMONIA" in the last 8760 hours. CBC: Recent Labs    05/22/21 1035 06/30/21 1038 07/22/21 1114  WBC 8.0 9.1 8.7  NEUTROABS 4.8 6.3 5.6  HGB 9.8* 10.6* 10.4*  HCT 30.3* 32.6* 32.0*  MCV 91.0 90.6 90.9  PLT 278 325 269   Lipid Panel: Recent Labs    09/23/20 0850 03/28/21 1002  CHOL 122 116  HDL 38* 44  LDLCALC 64 59  TRIG 113 65  CHOLHDL 3.2 2.6   Lab Results  Component Value Date   HGBA1C 5.6 03/28/2021    Procedures since last visit: No results found.  Assessment/Plan     Labs/tests ordered:  * No order type specified * Next appt:  09/29/2021

## 2021-09-22 LAB — URINE CULTURE
MICRO NUMBER:: 13892077
SPECIMEN QUALITY:: ADEQUATE

## 2021-09-22 MED ORDER — SACCHAROMYCES BOULARDII 250 MG PO CAPS: 250.0000 mg | ORAL_CAPSULE | Freq: Two times a day (BID) | ORAL | 0 refills | Status: DC

## 2021-09-22 MED ORDER — SULFAMETHOXAZOLE-TRIMETHOPRIM 800-160 MG PO TABS: 1.0000 | ORAL_TABLET | Freq: Two times a day (BID) | ORAL | 0 refills | Status: AC

## 2021-09-22 NOTE — Progress Notes (Signed)
Urine culture indicative of UTI -  sent e-prescription to pharmacy for Bactrim DS BID X 7 days and Florastor 250 mg BID X 10 days.

## 2021-09-24 ENCOUNTER — Encounter: Payer: Self-pay | Admitting: Podiatry

## 2021-09-24 ENCOUNTER — Ambulatory Visit (INDEPENDENT_AMBULATORY_CARE_PROVIDER_SITE_OTHER): Payer: Medicare Other | Admitting: Podiatry

## 2021-09-24 DIAGNOSIS — M79675 Pain in left toe(s): Secondary | ICD-10-CM | POA: Diagnosis not present

## 2021-09-24 DIAGNOSIS — L84 Corns and callosities: Secondary | ICD-10-CM | POA: Diagnosis not present

## 2021-09-24 DIAGNOSIS — M79674 Pain in right toe(s): Secondary | ICD-10-CM

## 2021-09-24 DIAGNOSIS — B351 Tinea unguium: Secondary | ICD-10-CM

## 2021-09-24 DIAGNOSIS — E1142 Type 2 diabetes mellitus with diabetic polyneuropathy: Secondary | ICD-10-CM

## 2021-09-28 NOTE — Progress Notes (Signed)
  Subjective:  Patient ID: Alison [name], female    DOB: Dec 07, 1945,  MRN: [id number]  Alison [name] presents to clinic today for at risk foot care with history of diabetic neuropathy and corn(s) bilateral 5th toes, callus(es) b/l lower extremities and painful mycotic nails.  Pain interferes with ambulation. Aggravating factors include wearing enclosed shoe gear. Painful toenails interfere with ambulation. Aggravating factors include wearing enclosed shoe gear. Pain is relieved with periodic professional debridement. Painful corns and calluses are aggravated when weightbearing with and without shoegear. Pain is relieved with periodic professional debridement.  Last known HgA1c was unknown. Patient does not monitor blood glucose daily.  New problem(s): None.   PCP is Ngetich, Nelda Bucks, NP , and last visit was  July 10, 2021.  Allergies  Allergen Reactions   Aspirin Nausea Only and Nausea And Vomiting    Can take 81 mg but can't take '325mg'$ , hard on stomach. Can take 81 mg but can't take '325mg'$ , hard on stomach.   Review of Systems: Negative except as noted in the HPI.  Objective: No changes noted in today's physical examination. Alison [name] is a pleasant 76 y.o. female in NAD. AAO x 3.  Vascular Examination: CFT <3 seconds b/l LE. Faintly palpable pedal pulses b/l LE. Pedal hair absent b/l LE. Skin temperature gradient WNL b/l. No pain with calf compression b/l. No edema b/l LE. No cyanosis or clubbing noted b/l LE.  Dermatological Examination: Pedal skin thin and atrophic b/l LE. No open wounds b/l LE. No interdigital macerations noted b/l LE. Toenails 1-5 b/l elongated, discolored, dystrophic, thickened, crumbly with subungual debris and tenderness to dorsal palpation.   Hyperkeratotic lesion(s) lateral nail border bilateral 5th toes and submet head 5 b/l.  No erythema, no edema, no drainage, no fluctuance.  Macule, tear drop shaped, noted lateral aspect right 5th metatarsal head  measuring 0.5 x 0.3 cm, flat. Vareigated color. No elevation, no palpable subcutaneous depth. No bleeding  Musculoskeletal Examination: Normal muscle strength 5/5 to all lower extremity muscle groups bilaterally. HAV with bunion deformity noted b/l LE. Hammertoe deformity noted 2-5 b/l. No pain, crepitus or joint limitation noted with ROM b/l LE.  Patient ambulates independently without assistive aids.  Neurological Examination: Pt has subjective symptoms of neuropathy. Protective sensation decreased with 10 gram monofilament b/l. Vibratory sensation intact b/l.  Assessment/Plan: 1. Pain due to onychomycosis of toenails of both feet   2. Corns and callosities   3. Diabetic peripheral neuropathy associated with type 2 diabetes mellitus (HCC)     -Examined patient. -Toenails 1-5 b/l were debrided in length and girth with sterile nail nippers and dremel without iatrogenic bleeding.  -Corn(s) lateral nailfold bilateral 5th digits and callus(es) submet head 5 b/l were pared utilizing sterile scalpel blade without incident. Total number debrided =4. -Patient/POA to call should there be question/concern in the interim.   Return in about 4 months (around 01/24/2022).  Marzetta Board, DPM

## 2021-09-29 ENCOUNTER — Other Ambulatory Visit: Payer: Medicare Other

## 2021-09-29 DIAGNOSIS — E1169 Type 2 diabetes mellitus with other specified complication: Secondary | ICD-10-CM

## 2021-09-29 DIAGNOSIS — E559 Vitamin D deficiency, unspecified: Secondary | ICD-10-CM | POA: Diagnosis not present

## 2021-09-29 DIAGNOSIS — E1149 Type 2 diabetes mellitus with other diabetic neurological complication: Secondary | ICD-10-CM

## 2021-09-29 DIAGNOSIS — I10 Essential (primary) hypertension: Secondary | ICD-10-CM | POA: Diagnosis not present

## 2021-09-29 DIAGNOSIS — E785 Hyperlipidemia, unspecified: Secondary | ICD-10-CM | POA: Diagnosis not present

## 2021-10-01 ENCOUNTER — Ambulatory Visit (INDEPENDENT_AMBULATORY_CARE_PROVIDER_SITE_OTHER): Payer: Medicare Other | Admitting: Family

## 2021-10-01 VITALS — BP 110/80 | HR 69 | Temp 96.8°F | Ht 65.0 in | Wt 178.2 lb

## 2021-10-01 DIAGNOSIS — M25562 Pain in left knee: Secondary | ICD-10-CM

## 2021-10-01 DIAGNOSIS — M25561 Pain in right knee: Secondary | ICD-10-CM

## 2021-10-01 DIAGNOSIS — N1831 Chronic kidney disease, stage 3a: Secondary | ICD-10-CM | POA: Diagnosis not present

## 2021-10-01 DIAGNOSIS — R350 Frequency of micturition: Secondary | ICD-10-CM | POA: Diagnosis not present

## 2021-10-01 DIAGNOSIS — H6121 Impacted cerumen, right ear: Secondary | ICD-10-CM | POA: Diagnosis not present

## 2021-10-01 DIAGNOSIS — G8929 Other chronic pain: Secondary | ICD-10-CM

## 2021-10-01 DIAGNOSIS — E1169 Type 2 diabetes mellitus with other specified complication: Secondary | ICD-10-CM

## 2021-10-01 DIAGNOSIS — E785 Hyperlipidemia, unspecified: Secondary | ICD-10-CM

## 2021-10-01 DIAGNOSIS — E1149 Type 2 diabetes mellitus with other diabetic neurological complication: Secondary | ICD-10-CM

## 2021-10-01 DIAGNOSIS — Z23 Encounter for immunization: Secondary | ICD-10-CM | POA: Diagnosis not present

## 2021-10-01 DIAGNOSIS — I10 Essential (primary) hypertension: Secondary | ICD-10-CM

## 2021-10-01 MED ORDER — LISINOPRIL 10 MG PO TABS: 10.0000 mg | ORAL_TABLET | Freq: Every day | ORAL | 1 refills | Status: DC

## 2021-10-01 MED ORDER — AMLODIPINE BESYLATE 10 MG PO TABS: 10.0000 mg | ORAL_TABLET | Freq: Every day | ORAL | 1 refills | Status: DC

## 2021-10-01 MED ORDER — LISINOPRIL 10 MG PO TABS: 30.0000 mg | ORAL_TABLET | Freq: Every day | ORAL | 1 refills | Status: DC

## 2021-10-01 NOTE — Patient Instructions (Signed)
-   discontinue Metformin blood sugars well controlled   - Reduce lisinopril from 30 mg tablet to 10 mg tablet one by mouth daily   - Increase Amlodipine 5 mg tablet to 10 mg tablet one by mouth daily

## 2021-10-01 NOTE — Progress Notes (Unsigned)
Provider: Marlowe Sax FNP-C   Keshanna Riso, Nelda Bucks, NP  Patient Care Team: Phylicia Mcgaugh, Nelda Bucks, NP as PCP - General (Family Medicine) Shirley Muscat Loreen Freud, MD as Referring Physician (Optometry) Latanya Maudlin, MD as Consulting Physician (Orthopedic Surgery) Warden Fillers, MD as Consulting Physician (Ophthalmology) Ladene Artist, MD as Consulting Physician (Gastroenterology) Abbie Sons, MD (Urology) Stechschulte, Nickola Major, MD as Consulting Physician (Surgery) Truitt Merle, MD as Consulting Physician (Hematology)  Extended Emergency Contact Information Primary Emergency Contact: Tarpley,[name] Address: [address]          [place], [place] [address] [name] of [place] Phone: [phone] Mobile Phone: [phone] Relation: Daughter Secondary Emergency Contact: [place], [place] [address] [name] of [place] Phone: [phone] Mobile Phone: [phone] Relation: Sister  Code Status:  Full Code  Goals of care: Advanced Directive information    07/10/2021    2:20 PM  Advanced Directives  Does Patient Have a Medical Advance Directive? No  Would patient like information on creating a medical advance directive? No - Patient declined     Chief Complaint  Patient presents with   Medical Management of Chronic Issues    6 month follow up.   Health Maintenance    Discuss the need for Eye exam, and Diabetic kidney evaluation.    Immunizations    Discuss the need for Covid Booster, and Influenza vaccine.    HPI:  Pt is a 76 y.o. female seen today for medical management of chronic diseases.   Recent lab results reviewed and discussed during visit.  CR 1.17 previous 1.06 Na 1333   Hgb 10.7  previous 10.4   Hgb A1C was 5.4 previous 5.6 states sometimes wakes up in the morning feels like legs are shaky.Daughter usually tells her to sit while she assist her with breakfast.currently on metformin 250 mg tablet daily. Has upcoming  appointment for annual eye exam   Complains of nausea if she over eats. Has a hx of Gastric surgery.  Past Medical History:  Diagnosis Date   Anxiety    Benign essential hypertension    Blood transfusion without reported diagnosis    Cataract    removed bilateraly   Diabetes mellitus without complication ([place])    Diverticulitis of colon (without mention of hemorrhage)(562.11)    GERD (gastroesophageal reflux disease)    Goiter, specified as simple    Helicobacter pylori gastritis    Hyperlipidemia LDL goal < 100    Intrahepatic bile duct dilation    Numbness    TOES RT FOOT and toes left foot   Obesity, unspecified    Osteoarthrosis, unspecified whether generalized or localized, lower leg    Osteopenia    Other specified disease of nail    FUNGUS OF FINGERNAILS   Pernicious anemia    Unspecified glaucoma(365.9)    Unspecified vitamin D deficiency    Wears dentures    Wears glasses    Past Surgical History:  Procedure Laterality Date   BACK SURGERY     CATARACT EXTRACTION     right eye 06/20/15 left eye 09/12/15   COLECTOMY  2009   COLONOSCOPY  09/06/2007   Dr Carol Ada   DILATION AND CURETTAGE OF UTERUS     ENDOSCOPIC RETROGRADE CHOLANGIOPANCREATOGRAPHY (ERCP) WITH PROPOFOL N/A 03/17/2019   Procedure: ENDOSCOPIC RETROGRADE CHOLANGIOPANCREATOGRAPHY (ERCP) WITH PROPOFOL;  Surgeon: Ladene Artist, MD;  Location: [facility] ENDOSCOPY;  Service: Endoscopy;  Laterality: N/A;   JOINT  REPLACEMENT     right   LUMBAR LAMINECTOMY/DECOMPRESSION MICRODISCECTOMY N/A 03/30/2013   Procedure: CENTRAL DECOMPRESSION L4 - L5 AND EXCISION 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;  Surgeon: Tobi Bastos, MD;  Location: [facility] ORS;  Service: Orthopedics;  Laterality: N/A;   MULTIPLE TOOTH EXTRACTIONS     REMOVAL OF STONES  03/17/2019   Procedure: REMOVAL OF STONES;  Surgeon: Ladene Artist, MD;  Location: [facility];  Service: Endoscopy;;   SIGMOIDOSCOPY  02/16/2018   Dr Nira Conn  03/17/2019    Procedure: SPHINCTEROTOMY;  Surgeon: Ladene Artist, MD;  Location: [facility];  Service: Endoscopy;;   TOTAL KNEE ARTHROPLASTY Right 02/01/2013   Procedure: RIGHT TOTAL KNEE ARTHROPLASTY;  Surgeon: Tobi Bastos, MD;  Location: [facility];  Service: Orthopedics;  Laterality: Right;   TOTAL KNEE ARTHROPLASTY Left 04/18/2014   Procedure: LEFT TOTAL KNEE ARTHROPLASTY;  Surgeon: Latanya Maudlin, MD;  Location: [facility];  Service: Orthopedics;  Laterality: Left;    Allergies  Allergen Reactions   Aspirin Nausea Only and Nausea And Vomiting    Can take 81 mg but can't take $RemoveBe'325mg'IBiHPSpVz$ , hard on stomach. Can take 81 mg but can't take $RemoveBe'325mg'SXYsoXOqX$ , hard on stomach.    Allergies as of 10/01/2021       Reactions   Aspirin Nausea Only, Nausea And Vomiting   Can take 81 mg but can't take $RemoveBe'325mg'RaTYHMqIp$ , hard on stomach. Can take 81 mg but can't take $RemoveBe'325mg'RtYwPWDvg$ , hard on stomach.        Medication List        Accurate as of October 01, 2021 10:40 AM. If you have any questions, ask your nurse or doctor.          STOP taking these medications    saccharomyces boulardii 250 MG capsule Commonly known as: Florastor       TAKE these medications    acetaminophen 650 MG CR tablet Commonly known as: TYLENOL Take 1,300 mg by mouth as needed.   amLODipine 5 MG tablet Commonly known as: NORVASC Take 1 tablet by mouth once daily   aspirin EC 81 MG tablet Take 81 mg by mouth daily.   atorvastatin 40 MG tablet Commonly known as: LIPITOR TAKE 1 TABLET BY MOUTH ONCE DAILY AT  6PM   CALCIUM 600 + D PO Take 1 tablet by mouth in the morning.   carboxymethylcellulose 0.5 % Soln Commonly known as: REFRESH PLUS Place 1 drop into both eyes 3 (three) times daily as needed (dry eyes).   celecoxib 200 MG capsule Commonly known as: CELEBREX Take 1 capsule (200 mg total) by mouth daily as needed.   DESITIN EX Apply 1 application topically as needed.   dextromethorphan-guaiFENesin 30-600 MG 12hr tablet Commonly  known as: MUCINEX DM Take 1 tablet by mouth 2 (two) times daily.   dorzolamide-timolol 22.3-6.8 MG/ML ophthalmic solution Commonly known as: COSOPT Place 1 drop into both eyes 2 (two) times daily.   gabapentin 300 MG capsule Commonly known as: NEURONTIN Take 1 capsule by mouth at bedtime   imatinib 100 MG tablet Commonly known as: GLEEVEC Take 3 tablets (300 mg total) by mouth daily. Take with meals and large glass of water.Caution:Chemotherapy   latanoprost 0.005 % ophthalmic solution Commonly known as: XALATAN Place 1 drop into both eyes at bedtime.   Lidocaine HCl 4 % Crea Apply 1 application topically 2 (two) times daily as needed (knee pain.).   lisinopril 30 MG tablet Commonly known as: ZESTRIL Take  1 tablet by mouth once daily   metFORMIN 500 MG tablet Commonly known as: GLUCOPHAGE TAKE 1/2 (ONE-HALF) TABLET BY MOUTH IN THE EVENING WITH SUPPER   multivitamin with minerals Tabs tablet Take 1 tablet by mouth daily.   nystatin ointment Commonly known as: MYCOSTATIN Apply 1 Application topically 2 (two) times daily.   OMEGA 3 PO Take 1,200 mg by mouth in the morning.   ondansetron 8 MG tablet Commonly known as: ZOFRAN Take 1 tablet (8 mg total) by mouth every 8 (eight) hours as needed for nausea or vomiting.   pantoprazole 40 MG tablet Commonly known as: PROTONIX Take 1 tablet (40 mg total) by mouth daily.   Vitamin D-3 25 MCG (1000 UT) Caps Take 1,000 Units by mouth in the morning.        Review of Systems  Constitutional:  Negative for appetite change, chills, fatigue, fever and unexpected weight change.  HENT:  Negative for congestion, dental problem, ear discharge, ear pain, facial swelling, hearing loss, nosebleeds, postnasal drip, rhinorrhea, sinus pressure, sinus pain, sneezing, sore throat, tinnitus and trouble swallowing.   Eyes:  Negative for pain, discharge, redness, itching and visual disturbance.  Respiratory:  Negative for cough, chest  tightness, shortness of breath and wheezing.   Cardiovascular:  Negative for chest pain, palpitations and leg swelling.  Gastrointestinal:  Negative for abdominal distention, abdominal pain, blood in stool, constipation, diarrhea, nausea and vomiting.  Endocrine: Negative for cold intolerance, heat intolerance, polydipsia, polyphagia and polyuria.  Genitourinary:  Negative for difficulty urinating, dysuria, flank pain, frequency and urgency.  Musculoskeletal:  Negative for arthralgias, back pain, gait problem, joint swelling, myalgias, neck pain and neck stiffness.  Skin:  Negative for color change, pallor, rash and wound.  Neurological:  Negative for dizziness, syncope, speech difficulty, weakness, light-headedness, numbness and headaches.  Hematological:  Does not bruise/bleed easily.  Psychiatric/Behavioral:  Negative for agitation, behavioral problems, confusion, hallucinations, self-injury, sleep disturbance and suicidal ideas. The patient is not nervous/anxious.     Immunization History  Administered Date(s) Administered   Fluad Quad(high Dose 65+) 09/05/2018, 09/26/2020   Influenza, High Dose Seasonal PF 10/02/2017, 11/20/2019   Influenza,inj,Quad PF,6+ Mos 10/03/2012, 09/21/2014, 11/07/2015   Influenza,inj,quad, With Preservative 11/12/2016   Influenza-Unspecified 10/28/2013   PFIZER(Purple Top)SARS-COV-2 Vaccination 03/25/2019, 03/25/2019, 04/18/2019, 11/01/2019, 05/23/2020, 10/07/2020   Pneumococcal Conjugate-13 01/04/2014, 01/04/2014, 11/04/2016   Pneumococcal Polysaccharide-23 12/29/2012, 11/19/2017   Zoster Recombinat (Shingrix) 11/25/2016, 01/26/2017   Pertinent  Health Maintenance Due  Topic Date Due   INFLUENZA VACCINE  08/12/2021   OPHTHALMOLOGY EXAM  09/23/2021   FOOT EXAM  02/19/2022   HEMOGLOBIN A1C  03/30/2022   COLONOSCOPY (Pts 45-35yrs Insurance coverage will need to be confirmed)  02/17/2023   DEXA SCAN  Completed      02/10/2021   10:48 AM 02/28/2021   10:32  AM 04/01/2021   10:52 AM 07/04/2021   12:40 PM 07/10/2021    2:20 PM  Fall Risk  Falls in the past year?  0 0 0 0  Was there an injury with Fall?  0 0 0 0  Fall Risk Category Calculator  0 0 0 0  Fall Risk Category  Low Low Low Low  Patient Fall Risk Level Low fall risk Low fall risk Low fall risk Low fall risk Low fall risk  Patient at Risk for Falls Due to  No Fall Risks No Fall Risks No Fall Risks No Fall Risks  Fall risk Follow up  Falls evaluation completed Falls evaluation  completed Falls evaluation completed Falls evaluation completed   Functional Status Survey:    Vitals:   10/01/21 1024  BP: (!) 108/98  Pulse: 69  Temp: (!) 96.8 F (36 C)  TempSrc: Temporal  SpO2: 99%  Weight: 178 lb 3.2 oz (80.8 kg)  Height: $Remove'5\' 5"'PejzGbB$  (1.651 m)   Body mass index is 29.65 kg/m. Physical Exam Vitals reviewed.  Constitutional:      General: She is not in acute distress.    Appearance: Normal appearance. She is overweight. She is not ill-appearing or diaphoretic.  HENT:     Head: Normocephalic.     Right Ear: There is impacted cerumen.     Left Ear: Tympanic membrane, ear canal and external ear normal. There is no impacted cerumen.     Ears:     Comments: ear cerumen lavaged with warm water and hydrogen peroxide moderate amounts of cerumen obtained.Tolerated procedure well.TM clear without any signs of infection.     Nose: Nose normal. No congestion or rhinorrhea.     Mouth/Throat:     Mouth: Mucous membranes are moist.     Pharynx: Oropharynx is clear. No oropharyngeal exudate or posterior oropharyngeal erythema.  Eyes:     General: No scleral icterus.       Right eye: No discharge.        Left eye: No discharge.     Extraocular Movements: Extraocular movements intact.     Conjunctiva/sclera: Conjunctivae normal.     Pupils: Pupils are equal, round, and reactive to light.  Neck:     Vascular: No carotid bruit.  Cardiovascular:     Rate and Rhythm: Normal rate and regular rhythm.      Pulses: Normal pulses.     Heart sounds: Normal heart sounds. No murmur heard.    No friction rub. No gallop.  Pulmonary:     Effort: Pulmonary effort is normal. No respiratory distress.     Breath sounds: Normal breath sounds. No wheezing, rhonchi or rales.  Chest:     Chest wall: No tenderness.  Abdominal:     General: Bowel sounds are normal. There is no distension.     Palpations: Abdomen is soft. There is no mass.     Tenderness: There is no abdominal tenderness. There is no right CVA tenderness, left CVA tenderness, guarding or rebound.  Musculoskeletal:        General: No swelling or tenderness. Normal range of motion.     Cervical back: Normal range of motion. No rigidity or tenderness.     Right lower leg: No edema.     Left lower leg: No edema.  Lymphadenopathy:     Cervical: No cervical adenopathy.  Skin:    General: Skin is warm and dry.     Coloration: Skin is not pale.     Findings: No bruising, erythema, lesion or rash.  Neurological:     Mental Status: She is alert and oriented to person, place, and time.     Cranial Nerves: No cranial nerve deficit.     Sensory: No sensory deficit.     Motor: No weakness.     Coordination: Coordination normal.     Gait: Gait abnormal.  Psychiatric:        Mood and Affect: Mood normal.        Speech: Speech normal.        Behavior: Behavior normal.        Thought Content: Thought content normal.  Judgment: Judgment normal.     Labs reviewed: Recent Labs    06/30/21 1038 07/22/21 1114 09/29/21 0835  NA 136 136 133*  K 4.4 4.3 4.7  CL 106 106 105  CO2 $Re'28 26 22  'qdX$ GLUCOSE 98 91 93  BUN $Re'12 13 12  'fRu$ CREATININE 1.02* 1.06* 1.17*  CALCIUM 9.3 9.1 9.0   Recent Labs    05/22/21 1035 06/30/21 1038 07/22/21 1114 09/29/21 0835  AST $Re'19 17 21 23  'wJj$ ALT $R'13 11 12 15  'HZ$ ALKPHOS 64 58 53  --   BILITOT 0.5 0.4 0.6 0.6  PROT 7.5 7.1 7.0 6.8  ALBUMIN 3.9 3.8 3.9  --    Recent Labs    06/30/21 1038 07/22/21 1114  09/29/21 0835  WBC 9.1 8.7 5.9  NEUTROABS 6.3 5.6 3,770  HGB 10.6* 10.4* 10.7*  HCT 32.6* 32.0* 32.0*  MCV 90.6 90.9 92.0  PLT 325 269 263   Lab Results  Component Value Date   TSH 0.62 09/29/2021   Lab Results  Component Value Date   HGBA1C 5.3 09/29/2021   Lab Results  Component Value Date   CHOL 95 09/29/2021   HDL 37 (L) 09/29/2021   LDLCALC 44 09/29/2021   TRIG 62 09/29/2021   CHOLHDL 2.6 09/29/2021    Significant Diagnostic Results in last 30 days:  No results found.  Assessment/Plan 1. Need for influenza vaccination Afebrile  Flut shot administered by CMA no acute reaction reported.  -Flu vaccine HIGH DOSE PF (Fluzone High dose)  2. Benign essential hypertension Blood pressure well controlled -Continue on amlodipine and lisinopril - amLODipine (NORVASC) 10 MG tablet; Take 1 tablet (10 mg total) by mouth daily.  Dispense: 90 tablet; Refill: 1 - TSH; Future - COMPLETE METABOLIC PANEL WITH GFR; Future - CBC with Differential/Platelet; Future - lisinopril (ZESTRIL) 10 MG tablet; Take 1 tablet (10 mg total) by mouth daily.  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 Chronic pain of both knees Chronic  OTC analgesic   6. Hyperlipidemia associated with type 2 diabetes mellitus ([facility]) LDL at goal  -Continue atorvastatin - Lipid panel; Future  7. Urinary frequency Afebrile - Urine Culture   8. Impacted cerumen of right ear Right ear cerumen lavaged with warm water and hydrogen peroxide moderate amounts of cerumen obtained.Tolerated procedure well.TM  clear without any signs of infection. Continue to monitor.   Family/ staff Communication: Reviewed plan of care with patient verbalized understanding  Labs/tests ordered:  - CBC with Differential/Platelet - CMP with eGFR([facility]) - TSH - Hgb A1C - Lipid panel   - Urine Culture   Next Appointment : Return in about 6 months (around 04/01/2022) for medical mangement of chronic issues., Fasting labs in 6 months prior to visit. 2 weeks for B/p check.   Sandrea Hughs, NP

## 2021-10-02 ENCOUNTER — Ambulatory Visit: Payer: Medicare Other | Admitting: Family

## 2021-10-02 ENCOUNTER — Inpatient Hospital Stay: Payer: Medicare Other | Attending: Hematology

## 2021-10-02 ENCOUNTER — Encounter: Payer: Self-pay | Admitting: Hematology

## 2021-10-02 ENCOUNTER — Inpatient Hospital Stay (HOSPITAL_BASED_OUTPATIENT_CLINIC_OR_DEPARTMENT_OTHER): Payer: Medicare Other | Admitting: Hematology

## 2021-10-02 ENCOUNTER — Other Ambulatory Visit: Payer: Self-pay

## 2021-10-02 VITALS — BP 119/68 | HR 75 | Temp 98.4°F | Resp 18 | Ht 65.0 in | Wt 179.8 lb

## 2021-10-02 DIAGNOSIS — Z8616 Personal history of COVID-19: Secondary | ICD-10-CM | POA: Diagnosis not present

## 2021-10-02 DIAGNOSIS — D649 Anemia, unspecified: Secondary | ICD-10-CM | POA: Insufficient documentation

## 2021-10-02 DIAGNOSIS — C49A0 Gastrointestinal stromal tumor, unspecified site: Secondary | ICD-10-CM

## 2021-10-02 DIAGNOSIS — Z79899 Other long term (current) drug therapy: Secondary | ICD-10-CM | POA: Insufficient documentation

## 2021-10-02 DIAGNOSIS — C49A2 Gastrointestinal stromal tumor of stomach: Secondary | ICD-10-CM | POA: Diagnosis not present

## 2021-10-02 LAB — CBC WITH DIFFERENTIAL (CANCER CENTER ONLY)
Abs Immature Granulocytes: 0 10*3/uL (ref 0.00–0.07)
Basophils Absolute: 0 10*3/uL (ref 0.0–0.1)
Basophils Relative: 1 %
Eosinophils Absolute: 0.2 10*3/uL (ref 0.0–0.5)
Eosinophils Relative: 4 %
HCT: 28.8 % — ABNORMAL LOW (ref 36.0–46.0)
Hemoglobin: 9.7 g/dL — ABNORMAL LOW (ref 12.0–15.0)
Immature Granulocytes: 0 %
Lymphocytes Relative: 26 %
Lymphs Abs: 1.4 10*3/uL (ref 0.7–4.0)
MCH: 30.8 pg (ref 26.0–34.0)
MCHC: 33.7 g/dL (ref 30.0–36.0)
MCV: 91.4 fL (ref 80.0–100.0)
Monocytes Absolute: 0.6 10*3/uL (ref 0.1–1.0)
Monocytes Relative: 10 %
Neutro Abs: 3.1 10*3/uL (ref 1.7–7.7)
Neutrophils Relative %: 59 %
Platelet Count: 232 10*3/uL (ref 150–400)
RBC: 3.15 MIL/uL — ABNORMAL LOW (ref 3.87–5.11)
RDW: 15.2 % (ref 11.5–15.5)
WBC Count: 5.3 10*3/uL (ref 4.0–10.5)
nRBC: 0 % (ref 0.0–0.2)

## 2021-10-02 LAB — IRON AND IRON BINDING CAPACITY (CC-WL,HP ONLY)
Iron: 29 ug/dL (ref 28–170)
Saturation Ratios: 11 % (ref 10.4–31.8)
TIBC: 262 ug/dL (ref 250–450)
UIBC: 233 ug/dL (ref 148–442)

## 2021-10-02 LAB — CMP (CANCER CENTER ONLY)
ALT: 17 U/L (ref 0–44)
AST: 21 U/L (ref 15–41)
Albumin: 3.9 g/dL (ref 3.5–5.0)
Alkaline Phosphatase: 73 U/L (ref 38–126)
Anion gap: 2 — ABNORMAL LOW (ref 5–15)
BUN: 12 mg/dL (ref 8–23)
CO2: 24 mmol/L (ref 22–32)
Calcium: 8.3 mg/dL — ABNORMAL LOW (ref 8.9–10.3)
Chloride: 107 mmol/L (ref 98–111)
Creatinine: 1.17 mg/dL — ABNORMAL HIGH (ref 0.44–1.00)
GFR, Estimated: 48 mL/min — ABNORMAL LOW (ref >60)
Glucose, Bld: 112 mg/dL — ABNORMAL HIGH (ref 70–99)
Potassium: 4.3 mmol/L (ref 3.5–5.1)
Sodium: 133 mmol/L — ABNORMAL LOW (ref 135–145)
Total Bilirubin: 0.3 mg/dL (ref 0.3–1.2)
Total Protein: 6.5 g/dL (ref 6.5–8.1)

## 2021-10-02 LAB — URINE CULTURE
MICRO NUMBER:: 13943820
SPECIMEN QUALITY:: ADEQUATE

## 2021-10-02 LAB — FERRITIN: Ferritin: 178 ng/mL (ref 11–307)

## 2021-10-02 LAB — MICROALBUMIN / CREATININE URINE RATIO
Creatinine, Urine: 347 mg/dL — ABNORMAL HIGH (ref 20–275)
Microalb Creat Ratio: 20 mcg/mg creat (ref <30)
Microalb, Ur: 7.1 mg/dL

## 2021-10-02 MED ORDER — IMATINIB MESYLATE 100 MG PO TABS: 300.0000 mg | ORAL_TABLET | Freq: Every day | ORAL | 2 refills | Status: DC

## 2021-10-02 MED ORDER — ONDANSETRON HCL 8 MG PO TABS: 8.0000 mg | ORAL_TABLET | Freq: Three times a day (TID) | ORAL | 2 refills | Status: DC | PRN

## 2021-10-02 NOTE — Progress Notes (Signed)
[place]   [phone] [phone]   Clinic Follow up Note   Patient Care Team: Ngetich, Nelda Bucks, NP as PCP - General (Family Medicine) Shirley Muscat Loreen Freud, MD as Referring Physician (Optometry) Latanya Maudlin, MD as Consulting Physician (Orthopedic Surgery) Warden Fillers, MD as Consulting Physician (Ophthalmology) Ladene Artist, MD as Consulting Physician (Gastroenterology) Abbie Sons, MD (Urology) Stechschulte, Nickola Major, MD as Consulting Physician (Surgery) Truitt Merle, MD as Consulting Physician (Hematology)  Date of Service:  10/02/2021  CHIEF COMPLAINT: f/u of GIST  CURRENT THERAPY:  Gleevec, started 03/09/21, currently 372m  ASSESSMENT & PLAN:  Alison Danteis a 76y.o. female with   1. 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 Work up showed acute anemia and large gastric mass on CT AP on 11/14/20. Gastric mass not evident on 12/03/20 endoscopy. S/p partial gastrectomy 01/14/21 by Dr. 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 No lymph nodes were evaluated.  -she has high risk of recurrence (86%) due to the large size and high grade.  -staging chest CT 02/17/21 negative for metastatic disease. 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 She is tolerating well so far with mild to moderate diarrhea, improved with Imodium. She developed worsening epigastric pain, and Gleevec was held for a month. -she restarted Gleevec at 100 mg on 05/22/21 and gradually increased to 300 mg daily in June -restaging CT CAP 07/22/21 showed postsurgical change, very small nodule along the margin of the resection site measuring 8 mm, likely related to surgery.  No other evidence of recurrence. -she is tolerating 3077mGleevec well overall with some gas.  -We  will see her back in 3 months with repeat CT   2. Anemia -she developed mild anemia at PCP visit 09/2020 -Hgb trended down, lowest 6.8 01/2021 after surgery. She received 2 units pRBCs with surgery -we previously discussed patients s/p gastric surgeries are at risk for B12 deficiency. -iron panel and ferritin on 05/22/21 were WNL. Repeat today is pending. -hgb down to 9.7 today (10/02/21)      PLAN: -continue imatinib at 30011maily  -I refilled zofran -f/u in 3 months, with lab and CT several days before   No problem-specific Assessment & Plan notes found for this encounter.   INTERVAL HISTORY:  Alison [name] here for a follow up of GIST. She was last seen by me on 07/25/21. She presents to the clinic accompanied by her daughter. She reports she has recovered well from her bout of Covid last month. She reports she is tolerating Gleevec relatively well overall, with excess gas and eating small portions to avoid nausea/vomiting.   All other systems were reviewed with the patient and are negative.  MEDICAL HISTORY:  Past Medical History:  Diagnosis Date   Anxiety    Benign essential hypertension    Blood transfusion without reported diagnosis    Cataract    removed bilateraly   Di[place]  Diverticulitis of colon (without mention of hemorrhage)(562.11)    GERD (gastroesophageal reflux disease)    Goiter, specified as simple    Helicobacter pylori gastritis    Hyperlipidemia LDL goal < 100    Intrahepatic bile duct dilation    Numbness    TOES RT FOOT and toes left foot  Obesity, unspecified    Osteoarthrosis, unspecified whether generalized or localized, lower leg    Osteopenia    Other specified disease of nail    FUNGUS OF FINGERNAILS   Pernicious anemia    Unspecified glaucoma(365.9)    Unspecified vitamin D deficiency    Wears dentures    Wears glasses     SURGICAL HISTORY: Past Surgical History:  Procedure Laterality Date    BACK SURGERY     CATARACT EXTRACTION     right eye 06/20/15 left eye 09/12/15   COLECTOMY  2009   COLONOSCOPY  09/06/2007   Dr Carol Ada   DILATION AND CURETTAGE OF UTERUS     ENDOSCOPIC RETROGRADE CHOLANGIOPANCREATOGRAPHY (ERCP) WITH PROPOFOL N/A 03/17/2019   Procedure: ENDOSCOPIC RETROGRADE CHOLANGIOPANCREATOGRAPHY (ERCP) WITH PROPOFOL;  Surgeon: Ladene Artist, MD;  Location: [facility] ENDOSCOPY;  Service: Endoscopy;  Laterality: N/A;   JOINT REPLACEMENT     right   LUMBAR LAMINECTOMY/DECOMPRESSION MICRODISCECTOMY N/A 03/30/2013   Procedure: CENTRAL DECOMPRESSION L4 - L5 AND EXCISION 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;  Surgeon: Tobi Bastos, MD;  Location: [facility];  Service: Orthopedics;  Laterality: N/A;   MULTIPLE TOOTH EXTRACTIONS     REMOVAL OF STONES  03/17/2019   Procedure: REMOVAL OF STONES;  Surgeon: Ladene Artist, MD;  Location: [place];  Service: Endoscopy;;   SIGMOIDOSCOPY  02/16/2018   Dr Nira Conn  03/17/2019   Procedure: SPHINCTEROTOMY;  Surgeon: Ladene Artist, MD;  Location: [place];  Service: Endoscopy;;   TOTAL KNEE ARTHROPLASTY Right 02/01/2013   Procedure: RIGHT TOTAL KNEE ARTHROPLASTY;  Surgeon: Tobi Bastos, MD;  Location: [facility];  Service: Orthopedics;  Laterality: Right;   TOTAL KNEE ARTHROPLASTY Left 04/18/2014   Procedure: LEFT TOTAL KNEE ARTHROPLASTY;  Surgeon: Latanya Maudlin, MD;  Location: [facility];  Service: Orthopedics;  Laterality: Left;    I have reviewed the social history and family history with the patient and they are unchanged from previous note.  ALLERGIES:  is allergic to aspirin.  MEDICATIONS:  Current Outpatient Medications  Medication Sig Dispense Refill   acetaminophen (TYLENOL) 650 MG CR tablet Take 1,300 mg by mouth as needed.     amLODipine (NORVASC) 10 MG tablet Take 1 tablet (10 mg total) by mouth daily. 90 tablet 1   aspirin EC 81 MG tablet Take 81 mg by mouth daily.     atorvastatin (LIPITOR) 40 MG tablet TAKE 1  TABLET BY MOUTH ONCE DAILY AT  6PM 90 tablet 1   Calcium Carb-Cholecalciferol (CALCIUM 600 + D PO) Take 1 tablet by mouth in the morning.     carboxymethylcellulose (REFRESH PLUS) 0.5 % SOLN Place 1 drop into both eyes 3 (three) times daily as needed (dry eyes).      Cholecalciferol (VITAMIN D-3) 1000 UNITS CAPS Take 1,000 Units by mouth in the morning.     dorzolamide-timolol (COSOPT) 22.3-6.8 MG/ML ophthalmic solution Place 1 drop into both eyes 2 (two) times daily.     gabapentin (NEURONTIN) 300 MG capsule Take 1 capsule by mouth at bedtime 90 capsule 1   imatinib (GLEEVEC) 100 MG tablet Take 3 tablets (300 mg total) by mouth daily. Take with meals and large glass of water.Caution:Chemotherapy 90 tablet 2   latanoprost (XALATAN) 0.005 % ophthalmic solution Place 1 drop into both eyes at bedtime.     Lidocaine HCl 4 % CREA Apply 1 application topically 2 (two) times daily as needed (knee pain.).  lisinopril (ZESTRIL) 10 MG tablet Take 1 tablet (10 mg total) by mouth daily. 90 tablet 1   Multiple Vitamin (MULTIVITAMIN WITH MINERALS) TABS tablet Take 1 tablet by mouth daily.     nystatin ointment (MYCOSTATIN) Apply 1 Application topically 2 (two) times daily. 30 g 1   Omega-3 Fatty Acids (OMEGA 3 PO) Take 1,200 mg by mouth in the morning.     ondansetron (ZOFRAN) 8 MG tablet Take 1 tablet (8 mg total) by mouth every 8 (eight) hours as needed for nausea or vomiting. 20 tablet 2   pantoprazole (PROTONIX) 40 MG tablet Take 1 tablet (40 mg total) by mouth daily. 30 tablet 11   Zinc Oxide (DESITIN EX) Apply 1 application topically as needed.     No current facility-administered medications for this visit.    PHYSICAL EXAMINATION: ECOG PERFORMANCE STATUS: 1 - Symptomatic but completely ambulatory  Vitals:   10/02/21 1329  BP: 119/68  Pulse: 75  Resp: 18  Temp: 98.4 F (36.9 C)  SpO2: 100%   Wt Readings from Last 3 Encounters:  10/02/21 179 lb 12.8 oz (81.6 kg)  10/01/21 178 lb 3.2 oz  (80.8 kg)  09/19/21 180 lb (81.6 kg)     GENERAL:alert, no distress and comfortable SKIN: skin color, texture, turgor are normal, no rashes or significant lesions EYES: normal, Conjunctiva are pink and non-injected, sclera clear  NECK: supple, thyroid normal size, non-tender, without nodularity LYMPH:  no palpable lymphadenopathy in the cervical, axillary  LUNGS: clear to auscultation and percussion with normal breathing effort HEART: regular rate & rhythm and no murmurs and no lower extremity edema ABDOMEN:abdomen soft, non-tender and normal bowel sounds Musculoskeletal:no cyanosis of digits and no clubbing  NEURO: alert & oriented x 3 with fluent speech, no focal motor/sensory deficits  LABORATORY DATA:  I have reviewed the data as listed    Latest Ref Rng & Units 10/02/2021   12:58 PM 09/29/2021    8:35 AM 07/22/2021   11:14 AM  CBC  WBC 4.0 - 10.5 K/uL 5.3  5.9  8.7   Hemoglobin 12.0 - 15.0 g/dL 9.7  10.7  10.4   Hematocrit 36.0 - 46.0 % 28.8  32.0  32.0   Platelets 150 - 400 K/uL 232  263  269         Latest Ref Rng & Units 10/02/2021   12:58 PM 09/29/2021    8:35 AM 07/22/2021   11:14 AM  CMP  Glucose 70 - 99 mg/dL 112  93  91   BUN 8 - 23 mg/dL '12  12  13   ' Creatinine 0.44 - 1.00 mg/dL 1.17  1.17  1.06   Sodium 135 - 145 mmol/L 133  133  136   Potassium 3.5 - 5.1 mmol/L 4.3  4.7  4.3   Chloride 98 - 111 mmol/L 107  105  106   CO2 22 - 32 mmol/L '24  22  26   ' Calcium 8.9 - 10.3 mg/dL 8.3  9.0  9.1   Total Protein 6.5 - 8.1 g/dL 6.5  6.8  7.0   Total Bilirubin 0.3 - 1.2 mg/dL 0.3  0.6  0.6   Alkaline Phos 38 - 126 U/L 73   53   AST 15 - 41 U/L '21  23  21   ' ALT 0 - 44 U/L '17  15  12       ' RADIOGRAPHIC STUDIES: I have personally reviewed the radiological images as listed and agreed with the  findings in the report. No results found.    Orders Placed This Encounter  Procedures   CT ABDOMEN PELVIS W CONTRAST    Hold iv contras if EGFR<50    Standing Status:    Future    Standing Expiration Date:   10/03/2022    Order Specific Question:   If indicated for the ordered procedure, I authorize the administration of contrast media per Radiology protocol    Answer:   Yes    Order Specific Question:   Preferred imaging location?    Answer:   [facility]    Order Specific Question:   Is Oral Contrast requested for this exam?    Answer:   Yes, Per Radiology protocol   All questions were answered. The patient knows to call the clinic with any problems, questions or concerns. No barriers to learning was detected. The total time spent in the appointment was 30 minutes.     Truitt Merle, MD 10/02/2021   I, Wilburn Mylar, am acting as scribe for Truitt Merle, MD.   I have reviewed the above documentation for accuracy and completeness, and I agree with the above.

## 2021-10-03 LAB — COMPLETE METABOLIC PANEL WITH GFR
AG Ratio: 1.5 (calc) (ref 1.0–2.5)
ALT: 15 U/L (ref 6–29)
AST: 23 U/L (ref 10–35)
Albumin: 4.1 g/dL (ref 3.6–5.1)
Alkaline phosphatase (APISO): 51 U/L (ref 37–153)
BUN/Creatinine Ratio: 10 (calc) (ref 6–22)
BUN: 12 mg/dL (ref 7–25)
CO2: 22 mmol/L (ref 20–32)
Calcium: 9 mg/dL (ref 8.6–10.4)
Chloride: 105 mmol/L (ref 98–110)
Creat: 1.17 mg/dL — ABNORMAL HIGH (ref 0.60–1.00)
Globulin: 2.7 g/dL (calc) (ref 1.9–3.7)
Glucose, Bld: 93 mg/dL (ref 65–99)
Potassium: 4.7 mmol/L (ref 3.5–5.3)
Sodium: 133 mmol/L — ABNORMAL LOW (ref 135–146)
Total Bilirubin: 0.6 mg/dL (ref 0.2–1.2)
Total Protein: 6.8 g/dL (ref 6.1–8.1)
eGFR: 48 mL/min/{1.73_m2} — ABNORMAL LOW (ref >=60)

## 2021-10-03 LAB — CBC WITH DIFFERENTIAL/PLATELET
Absolute Monocytes: 425 cells/uL (ref 200–950)
Basophils Absolute: 30 cells/uL (ref 0–200)
Basophils Relative: 0.5 %
Eosinophils Absolute: 112 cells/uL (ref 15–500)
Eosinophils Relative: 1.9 %
HCT: 32 % — ABNORMAL LOW (ref 35.0–45.0)
Hemoglobin: 10.7 g/dL — ABNORMAL LOW (ref 11.7–15.5)
Lymphs Abs: 1564 cells/uL (ref 850–3900)
MCH: 30.7 pg (ref 27.0–33.0)
MCHC: 33.4 g/dL (ref 32.0–36.0)
MCV: 92 fL (ref 80.0–100.0)
MPV: 10.3 fL (ref 7.5–12.5)
Monocytes Relative: 7.2 %
Neutro Abs: 3770 cells/uL (ref 1500–7800)
Neutrophils Relative %: 63.9 %
Platelets: 263 10*3/uL (ref 140–400)
RBC: 3.48 10*6/uL — ABNORMAL LOW (ref 3.80–5.10)
RDW: 13.2 % (ref 11.0–15.0)
Total Lymphocyte: 26.5 %
WBC: 5.9 10*3/uL (ref 3.8–10.8)

## 2021-10-03 LAB — VITAMIN D 1,25 DIHYDROXY
Vitamin D 1, 25 (OH)2 Total: 38 pg/mL (ref 18–72)
Vitamin D2 1, 25 (OH)2: <8 pg/mL
Vitamin D3 1, 25 (OH)2: 38 pg/mL

## 2021-10-03 LAB — TSH: TSH: 0.62 mIU/L (ref 0.40–4.50)

## 2021-10-03 LAB — LIPID PANEL
Cholesterol: 95 mg/dL (ref <200)
HDL: 37 mg/dL — ABNORMAL LOW (ref >=50)
LDL Cholesterol (Calc): 44 mg/dL (calc)
Non-HDL Cholesterol (Calc): 58 mg/dL (calc) (ref <130)
Total CHOL/HDL Ratio: 2.6 (calc) (ref <5.0)
Triglycerides: 62 mg/dL (ref <150)

## 2021-10-03 LAB — HEMOGLOBIN A1C
Hgb A1c MFr Bld: 5.3 % of total Hgb (ref <5.7)
Mean Plasma Glucose: 105 mg/dL
eAG (mmol/L): 5.8 mmol/L

## 2021-10-15 ENCOUNTER — Ambulatory Visit (INDEPENDENT_AMBULATORY_CARE_PROVIDER_SITE_OTHER): Payer: Medicare Other | Admitting: Family

## 2021-10-15 ENCOUNTER — Encounter: Payer: Self-pay | Admitting: Family

## 2021-10-15 VITALS — BP 100/60 | HR 76 | Temp 97.3°F | Resp 16 | Ht 65.0 in | Wt 183.2 lb

## 2021-10-15 DIAGNOSIS — I10 Essential (primary) hypertension: Secondary | ICD-10-CM | POA: Diagnosis not present

## 2021-10-15 NOTE — Progress Notes (Signed)
Provider: Marlowe Sax FNP-C  Fredis Malkiewicz, Nelda Bucks, NP  Patient Care Team: Leean Amezcua, Nelda Bucks, NP as PCP - General (Family Medicine) Shirley Muscat Loreen Freud, MD as Referring Physician (Optometry) Latanya Maudlin, MD as Consulting Physician (Orthopedic Surgery) Warden Fillers, MD as Consulting Physician (Ophthalmology) Ladene Artist, MD as Consulting Physician (Gastroenterology) Abbie Sons, MD (Urology) Stechschulte, Nickola Major, MD as Consulting Physician (Surgery) Truitt Merle, MD as Consulting Physician (Hematology)  Extended Emergency Contact Information Primary Emergency Contact: Marineau,[name] Address: [address]          [place], [place] [address] [name] of [place] Phone: [phone] Mobile Phone: [phone] Relation: Daughter Secondary Emergency Contact: [place], [place] [address] [name] of [place] Phone: [phone] Mobile Phone: [phone] Relation: Sister  Code Status:  Full Code  Goals of care: Advanced Directive information    10/15/2021    9:28 AM  Advanced Directives  Does Patient Have a Medical Advance Directive? No  Would patient like information on creating a medical advance directive? No - Patient declined     Chief Complaint  Patient presents with   Follow-up    2 week follow up on blood pressure.     HPI:  Pt is a 76 y.o. female seen today for an acute visit for 2 weeks follow up for high blood pressure. States does not check B/p at home. denies any headache,dizziness,vision changes,fatigue,chest tightness,palpitation,chest pain or shortness of breath.    Current on amlodipine and lisinopril .    Past Medical History:  Diagnosis Date   Anxiety    Benign essential hypertension    Blood transfusion without reported diagnosis    Cataract    removed bilateraly   Diabetes mellitus without complication ([place])    Diverticulitis of colon (without mention of hemorrhage)(562.11)    GERD (gastroesophageal  reflux disease)    Goiter, specified as simple    Helicobacter pylori gastritis    Hyperlipidemia LDL goal < 100    Intrahepatic bile duct dilation    Numbness    TOES RT FOOT and toes left foot   Obesity, unspecified    Osteoarthrosis, unspecified whether generalized or localized, lower leg    Osteopenia    Other specified disease of nail    FUNGUS OF FINGERNAILS   Pernicious anemia    Unspecified glaucoma(365.9)    Unspecified vitamin D deficiency    Wears dentures    Wears glasses    Past Surgical History:  Procedure Laterality Date   BACK SURGERY     CATARACT EXTRACTION     right eye 06/20/15 left eye 09/12/15   COLECTOMY  2009   COLONOSCOPY  09/06/2007   Dr Carol Ada   DILATION AND CURETTAGE OF UTERUS     ENDOSCOPIC RETROGRADE CHOLANGIOPANCREATOGRAPHY (ERCP) WITH PROPOFOL N/A 03/17/2019   Procedure: ENDOSCOPIC RETROGRADE CHOLANGIOPANCREATOGRAPHY (ERCP) WITH PROPOFOL;  Surgeon: Ladene Artist, MD;  Location: [facility] ENDOSCOPY;  Service: Endoscopy;  Laterality: N/A;   JOINT REPLACEMENT     right   LUMBAR LAMINECTOMY/DECOMPRESSION MICRODISCECTOMY N/A 03/30/2013   Procedure: CENTRAL DECOMPRESSION L4 - L5 AND EXCISION 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;  Surgeon: Tobi Bastos, MD;  Location: [facility];  Service: Orthopedics;  Laterality: N/A;   MULTIPLE TOOTH EXTRACTIONS     REMOVAL OF STONES  03/17/2019   Procedure: REMOVAL OF STONES;  Surgeon: Ladene Artist, MD;  Location: [place];  Service: Endoscopy;;   SIGMOIDOSCOPY  02/16/2018   Dr Fuller Plan   Joan Mayans  03/17/2019   Procedure: SPHINCTEROTOMY;  Surgeon: Ladene Artist, MD;  Location: [place];  Service: Endoscopy;;   TOTAL KNEE ARTHROPLASTY Right 02/01/2013   Procedure: RIGHT TOTAL KNEE ARTHROPLASTY;  Surgeon: Tobi Bastos, MD;  Location: [facility];  Service: Orthopedics;  Laterality: Right;   TOTAL KNEE ARTHROPLASTY Left 04/18/2014   Procedure: LEFT TOTAL KNEE ARTHROPLASTY;  Surgeon: Latanya Maudlin, MD;  [facility];  Service: Orthopedics;  Laterality: Left;    Allergies  Allergen Reactions   Aspirin Nausea Only and Nausea And Vomiting    Can take 81 mg but can't take '325mg'$ , hard on stomach. Can take 81 mg but can't take '325mg'$ , hard on stomach.    Outpatient Encounter Medications as of 10/15/2021  Medication Sig   acetaminophen (TYLENOL) 650 MG CR tablet Take 1,300 mg by mouth as needed.   amLODipine (NORVASC) 10 MG tablet Take 1 tablet (10 mg total) by mouth daily.   aspirin EC 81 MG tablet Take 81 mg by mouth daily.   atorvastatin (LIPITOR) 40 MG tablet TAKE 1 TABLET BY MOUTH ONCE DAILY AT  6PM   Calcium Carb-Cholecalciferol (CALCIUM 600 + D PO) Take 1 tablet by mouth in the morning.   carboxymethylcellulose (REFRESH PLUS) 0.5 % SOLN Place 1 drop into both eyes 3 (three) times daily as needed (dry eyes).    Cholecalciferol (VITAMIN D-3) 1000 UNITS CAPS Take 1,000 Units by mouth in the morning.   dorzolamide-timolol (COSOPT) 22.3-6.8 MG/ML ophthalmic solution Place 1 drop into both eyes 2 (two) times daily.   gabapentin (NEURONTIN) 300 MG capsule Take 1 capsule by mouth at bedtime   imatinib (GLEEVEC) 100 MG tablet Take 3 tablets (300 mg total) by mouth daily. Take with meals and large glass of water.Caution:Chemotherapy   latanoprost (XALATAN) 0.005 % ophthalmic solution Place 1 drop into both eyes at bedtime.   Lidocaine HCl 4 % CREA Apply 1 application topically 2 (two) times daily as needed (knee pain.).   lisinopril (ZESTRIL) 10 MG tablet Take 1 tablet (10 mg total) by mouth daily.   Multiple Vitamin (MULTIVITAMIN WITH MINERALS) TABS tablet Take 1 tablet by mouth daily.   nystatin ointment (MYCOSTATIN) Apply 1 Application topically 2 (two) times daily.   Omega-3 Fatty Acids (OMEGA 3 PO) Take 1,200 mg by mouth in the morning.   ondansetron (ZOFRAN) 8 MG tablet Take 1 tablet (8 mg total) by mouth every 8 (eight) hours as needed for nausea or vomiting.   pantoprazole (PROTONIX) 40 MG tablet  Take 1 tablet (40 mg total) by mouth daily.   Zinc Oxide (DESITIN EX) Apply 1 application topically as needed.   No facility-administered encounter medications on file as of 10/15/2021.    Review of Systems  Constitutional:  Negative for appetite change, chills, fatigue, fever and unexpected weight change.  Eyes:  Negative for pain, discharge, redness, itching and visual disturbance.  Respiratory:  Negative for cough, chest tightness, shortness of breath and wheezing.   Cardiovascular:  Negative for chest pain, palpitations and leg swelling.  Gastrointestinal:  Negative for abdominal distention, abdominal pain, constipation, diarrhea, nausea and vomiting.  Musculoskeletal:  Negative for arthralgias, back pain, gait problem, joint swelling, myalgias, neck pain and neck stiffness.  Skin:  Negative for color change, pallor and rash.  Neurological:  Negative for dizziness, syncope, speech difficulty, weakness, light-headedness, numbness and headaches.    Immunization History  Administered Date(s) Administered   Fluad Quad(high Dose  65+) 09/05/2018, 09/26/2020, 10/01/2021, 10/02/2021   Influenza, High Dose Seasonal PF 10/02/2017, 11/20/2019   Influenza,inj,Quad PF,6+ Mos 10/03/2012, 09/21/2014, 11/07/2015   Influenza,inj,quad, With Preservative 11/12/2016   Influenza-Unspecified 10/28/2013   PFIZER(Purple Top)SARS-COV-2 Vaccination 03/25/2019, 03/25/2019, 04/18/2019, 11/01/2019, 05/23/2020, 10/07/2020   Pneumococcal Conjugate-13 01/04/2014, 01/04/2014, 11/04/2016   Pneumococcal Polysaccharide-23 12/29/2012, 11/19/2017   Zoster Recombinat (Shingrix) 11/25/2016, 01/26/2017   Pertinent  Health Maintenance Due  Topic Date Due   OPHTHALMOLOGY EXAM  09/23/2021   FOOT EXAM  02/19/2022   HEMOGLOBIN A1C  03/30/2022   COLONOSCOPY (Pts 45-56yr Insurance coverage will need to be confirmed)  02/17/2023   INFLUENZA VACCINE  Completed   DEXA SCAN  Completed      02/28/2021   10:32 AM 04/01/2021    10:52 AM 07/04/2021   12:40 PM 07/10/2021    2:20 PM 10/15/2021    9:28 AM  Fall Risk  Falls in the past year? 0 0 0 0 0  Was there an injury with Fall? 0 0 0 0 0  Fall Risk Category Calculator 0 0 0 0 0  Fall Risk Category Low Low Low Low Low  Patient Fall Risk Level Low fall risk Low fall risk Low fall risk Low fall risk Low fall risk  Patient at Risk for Falls Due to No Fall Risks No Fall Risks No Fall Risks No Fall Risks No Fall Risks  Fall risk Follow up Falls evaluation completed Falls evaluation completed Falls evaluation completed Falls evaluation completed Falls evaluation completed   Functional Status Survey:    Vitals:   10/15/21 0924  BP: 100/60  Pulse: 76  Resp: 16  Temp: (!) 97.3 F (36.3 C)  SpO2: 96%  Weight: 183 lb 3.2 oz (83.1 kg)  Height: '5\' 5"'$  (1.651 m)   Body mass index is 30.49 kg/m. Physical Exam Vitals reviewed.  Constitutional:      General: She is not in acute distress.    Appearance: Normal appearance. She is normal weight. She is not ill-appearing or diaphoretic.  HENT:     Mouth/Throat:     Mouth: Mucous membranes are moist.     Pharynx: Oropharynx is clear. No oropharyngeal exudate or posterior oropharyngeal erythema.  Eyes:     General: No scleral icterus.       Right eye: No discharge.        Left eye: No discharge.     Conjunctiva/sclera: Conjunctivae normal.     Pupils: Pupils are equal, round, and reactive to light.  Neck:     Vascular: No carotid bruit.  Cardiovascular:     Rate and Rhythm: Normal rate and regular rhythm.     Pulses: Normal pulses.     Heart sounds: Normal heart sounds. No murmur heard.    No friction rub. No gallop.  Pulmonary:     Effort: Pulmonary effort is normal. No respiratory distress.     Breath sounds: Normal breath sounds. No wheezing, rhonchi or rales.  Chest:     Chest wall: No tenderness.  Abdominal:     General: Bowel sounds are normal. There is no distension.     Palpations: Abdomen is soft.  There is no mass.     Tenderness: There is no abdominal tenderness. There is no right CVA tenderness, left CVA tenderness, guarding or rebound.  Musculoskeletal:        General: No swelling or tenderness. Normal range of motion.     Cervical back: Normal range of motion. No rigidity or tenderness.  Right lower leg: No edema.     Left lower leg: No edema.  Lymphadenopathy:     Cervical: No cervical adenopathy.  Skin:    General: Skin is warm and dry.     Coloration: Skin is not pale.     Findings: No erythema or rash.  Neurological:     Mental Status: She is alert and oriented to person, place, and time.     Cranial Nerves: No cranial nerve deficit.     Motor: No weakness.     Gait: Gait abnormal.  Psychiatric:        Mood and Affect: Mood normal.        Speech: Speech normal.        Behavior: Behavior normal.     Labs reviewed: Recent Labs    07/22/21 1114 09/29/21 0835 10/02/21 1258  NA 136 133* 133*  K 4.3 4.7 4.3  CL 106 105 107  CO2 '26 22 24  '$ GLUCOSE 91 93 112*  BUN '13 12 12  '$ CREATININE 1.06* 1.17* 1.17*  CALCIUM 9.1 9.0 8.3*   Recent Labs    06/30/21 1038 07/22/21 1114 09/29/21 0835 10/02/21 1258  AST '17 21 23 21  '$ ALT '11 12 15 17  '$ ALKPHOS 58 53  --  73  BILITOT 0.4 0.6 0.6 0.3  PROT 7.1 7.0 6.8 6.5  ALBUMIN 3.8 3.9  --  3.9   Recent Labs    07/22/21 1114 09/29/21 0835 10/02/21 1258  WBC 8.7 5.9 5.3  NEUTROABS 5.6 3,770 3.1  HGB 10.4* 10.7* 9.7*  HCT 32.0* 32.0* 28.8*  MCV 90.9 92.0 91.4  PLT 269 263 232   Lab Results  Component Value Date   TSH 0.62 09/29/2021   Lab Results  Component Value Date   HGBA1C 5.3 09/29/2021   Lab Results  Component Value Date   CHOL 95 09/29/2021   HDL 37 (L) 09/29/2021   LDLCALC 44 09/29/2021   TRIG 62 09/29/2021   CHOLHDL 2.6 09/29/2021    Significant Diagnostic Results in last 30 days:  No results found.  Assessment/Plan  Benign essential hypertension B/p well controlled  No home readings  for review  - continue on Amlodipine  and Lisinopril  - encouraged to check B/p at least daily  - dietary modification and exercise advised   Family/ staff Communication: Reviewed plan of care with patient verbalized understanding   Labs/tests ordered: None   Next Appointment: Return if symptoms worsen or fail to improve.   Sandrea Hughs, NP

## 2021-10-31 ENCOUNTER — Other Ambulatory Visit: Payer: Self-pay

## 2021-10-31 ENCOUNTER — Telehealth: Payer: Self-pay

## 2021-10-31 NOTE — Telephone Encounter (Signed)
Pt called stating that she's having epigastric pain x1 day.  Pt stated she only has the stomach pain 30 to 45 mins after she takes her Gleevec.  Pt stated the pain last up to 2 to 3 hrs after taking the medication.  Pt denied diarrhea, n/v, and pain anywhere else.  Pt stated she had to stop her Gleevec before in the past d/t stomach pain but pt stated she's been taking the '300mg'$  for a while now.  Informed pt that Dr. Burr Medico is out of the office today but this RN will make her aware.  Instructed pt to continue to take the [place] for the next few days to confirm that it is the cause of the stomach pain.  Instructed pt to contact Dr. Ernestina Penna office on Monday morning to if the pain is still happening after taking the [place].  Also informed pt if the pain is still happening Saturday, she may contact our on-call line to speak with our on-call provider.  Pt agreed with plan.  Notified Dr. Burr Medico of the pt's call.

## 2021-11-03 ENCOUNTER — Telehealth: Payer: Self-pay | Admitting: *Deleted

## 2021-11-03 NOTE — Telephone Encounter (Signed)
Received call from pt stating that she did well over w/e.

## 2021-11-07 DIAGNOSIS — H26493 Other secondary cataract, bilateral: Secondary | ICD-10-CM | POA: Diagnosis not present

## 2021-11-07 DIAGNOSIS — H401131 Primary open-angle glaucoma, bilateral, mild stage: Secondary | ICD-10-CM | POA: Diagnosis not present

## 2021-11-07 DIAGNOSIS — E119 Type 2 diabetes mellitus without complications: Secondary | ICD-10-CM | POA: Diagnosis not present

## 2021-11-07 DIAGNOSIS — Z961 Presence of intraocular lens: Secondary | ICD-10-CM | POA: Diagnosis not present

## 2021-11-07 DIAGNOSIS — H35033 Hypertensive retinopathy, bilateral: Secondary | ICD-10-CM | POA: Diagnosis not present

## 2021-11-07 DIAGNOSIS — H43813 Vitreous degeneration, bilateral: Secondary | ICD-10-CM | POA: Diagnosis not present

## 2021-12-07 ENCOUNTER — Other Ambulatory Visit: Payer: Self-pay | Admitting: Gastroenterology

## 2021-12-12 ENCOUNTER — Other Ambulatory Visit: Payer: Self-pay | Admitting: Family

## 2021-12-12 DIAGNOSIS — I10 Essential (primary) hypertension: Secondary | ICD-10-CM

## 2021-12-30 ENCOUNTER — Ambulatory Visit (HOSPITAL_COMMUNITY)
Admission: RE | Admit: 2021-12-30 | Discharge: 2021-12-30 | Disposition: A | Discharge status: Home / Self Care | Payer: Medicare Other | Source: Ambulatory Visit | Attending: Hematology | Admitting: Hematology

## 2021-12-30 DIAGNOSIS — C49A0 Gastrointestinal stromal tumor, unspecified site: Secondary | ICD-10-CM | POA: Diagnosis not present

## 2021-12-30 DIAGNOSIS — E1149 Type 2 diabetes mellitus with other diabetic neurological complication: Secondary | ICD-10-CM

## 2021-12-30 DIAGNOSIS — K8020 Calculus of gallbladder without cholecystitis without obstruction: Secondary | ICD-10-CM | POA: Diagnosis not present

## 2021-12-30 LAB — POCT I-STAT CREATININE: Creatinine, Ser: 1 mg/dL (ref 0.44–1.00)

## 2021-12-30 MED ORDER — SODIUM CHLORIDE (PF) 0.9 % IJ SOLN
INTRAMUSCULAR | Status: AC
  Filled 2021-12-30: qty 50

## 2021-12-30 MED ORDER — IOHEXOL 300 MG/ML  SOLN
75.0000 mL | Freq: Once | INTRAMUSCULAR | Status: AC | PRN
  Administered 2021-12-30: 75 mL via INTRAVENOUS

## 2021-12-31 NOTE — Progress Notes (Unsigned)
[place]   [phone]) [phone] [phone]) [phone]   Clinic Follow up Note   Patient Care Team: Ngetich, Nelda Bucks, NP as PCP - General (Family Medicine) Shirley Muscat Loreen Freud, MD as Referring Physician (Optometry) Latanya Maudlin, MD as Consulting Physician (Orthopedic Surgery) Warden Fillers, MD as Consulting Physician (Ophthalmology) Ladene Artist, MD as Consulting Physician (Gastroenterology) Abbie Sons, MD (Urology) Stechschulte, Nickola Major, MD as Consulting Physician (Surgery) Truitt Merle, MD as Consulting Physician (Hematology)  Date of Service:  01/01/2022  CHIEF COMPLAINT: f/u of  GIST   CURRENT THERAPY:  Gleevec, started 03/09/21, currently 310m   ASSESSMENT:  BSafina Huardis a 76y.o. female with   1. 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 S/p partial gastrectomy 01/14/21 by Dr. 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 No lymph nodes were evaluated.  -she has high risk of recurrence (86%) due to the large size and high grade.  -staging chest CT 02/17/21 negative for metastatic disease. 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 She is tolerating well so far with mild to moderate diarrhea, improved with Imodium. She developed worsening epigastric pain, and Gleevec was held for a month. -she restarted Gleevec at 100 mg on 05/22/21 and gradually increased to 300 mg daily in June, she is tolerating well so far  -restaging CT CAP from 12/31/2021 showed no evidence of recurrence.  Previous nodular area adjacent to the suture margin has resolved. 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 -She is clinically doing well, tolerating imatinib well, no clinical  concern for recurrence. -Plan to repeat a neck scan in 6 months.   PLAN: -lab reviewed  - Discuss Ct Scan -NED -lab,f/u in 329ms  SUMMARY OF ONCOLOGIC HISTORY: Oncology History   No history exists.     INTERVAL HISTORY:  BaMirelle Biskups here for a follow up of GIST  She was last seen by me on 10/02/2021 She presents to the clinic accompanied by family member. Pt report of a dry patch on her skin on the leg. Pt report of having a good appetite. Pt denies having diarrhea.    All other systems were reviewed with the patient and are negative.  MEDICAL HISTORY:  Past Medical History:  Diagnosis Date   Anxiety    Benign essential hypertension    Blood transfusion without reported diagnosis    Cataract    removed bilateraly   Di[place] mellitus without complication [place]   Diverticulitis of colon (without mention of hemorrhage)(562.11)    GERD (gastroesophageal reflux disease)    Goiter, specified as simple    Helicobacter pylori gastritis    Hyperlipidemia LDL goal < 100    Intrahepatic bile duct dilation    Numbness    TOES RT FOOT and toes left foot   Obesity, unspecified    Osteoarthrosis, unspecified whether generalized or localized, lower leg    Osteopenia    Other specified disease of nail    FUNGUS OF FINGERNAILS   Pernicious anemia    Unspecified glaucoma(365.9)    Unspecified vitamin D deficiency    Wears dentures    Wears glasses     SURGICAL HISTORY: Past Surgical History:  Procedure Laterality Date   BACK SURGERY  CATARACT EXTRACTION     right eye 06/20/15 left eye 09/12/15   COLECTOMY  2009   COLONOSCOPY  09/06/2007   Dr Carol Ada   DILATION AND CURETTAGE OF UTERUS     ENDOSCOPIC RETROGRADE CHOLANGIOPANCREATOGRAPHY (ERCP) WITH PROPOFOL N/A 03/17/2019   Procedure: ENDOSCOPIC RETROGRADE CHOLANGIOPANCREATOGRAPHY (ERCP) WITH PROPOFOL;  Surgeon: Ladene Artist, MD;  Location: [facility] ENDOSCOPY;  Service: Endoscopy;  Laterality: N/A;   JOINT REPLACEMENT      right   LUMBAR LAMINECTOMY/DECOMPRESSION MICRODISCECTOMY N/A 03/30/2013   Procedure: CENTRAL DECOMPRESSION L4 - L5 AND EXCISION 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;  Surgeon: Tobi Bastos, MD;  Location: [facility] ORS;  Service: Orthopedics;  Laterality: N/A;   MULTIPLE TOOTH EXTRACTIONS     REMOVAL OF STONES  03/17/2019   Procedure: REMOVAL OF STONES;  Surgeon: Ladene Artist, MD;  Location: [place];  Service: Endoscopy;;   SIGMOIDOSCOPY  02/16/2018   Dr Nira Conn  03/17/2019   Procedure: SPHINCTEROTOMY;  Surgeon: Ladene Artist, MD;  Location: [place];  Service: Endoscopy;;   TOTAL KNEE ARTHROPLASTY Right 02/01/2013   Procedure: RIGHT TOTAL KNEE ARTHROPLASTY;  Surgeon: Tobi Bastos, MD;  Location: [facility] ORS;  Service: Orthopedics;  Laterality: Right;   TOTAL KNEE ARTHROPLASTY Left 04/18/2014   Procedure: LEFT TOTAL KNEE ARTHROPLASTY;  Surgeon: Latanya Maudlin, MD;  Location: [facility] ORS;  Service: Orthopedics;  Laterality: Left;    I have reviewed the social history and family history with the patient and they are unchanged from previous note.  ALLERGIES:  is allergic to aspirin.  MEDICATIONS:  Current Outpatient Medications  Medication Sig Dispense Refill   acetaminophen (TYLENOL) 650 MG CR tablet Take 1,300 mg by mouth as needed.     amLODipine (NORVASC) 10 MG tablet Take 1 tablet (10 mg total) by mouth daily. 90 tablet 1   aspirin EC 81 MG tablet Take 81 mg by mouth daily.     atorvastatin (LIPITOR) 40 MG tablet TAKE 1 TABLET BY MOUTH ONCE DAILY AT  6PM 90 tablet 1   Calcium Carb-Cholecalciferol (CALCIUM 600 + D PO) Take 1 tablet by mouth in the morning.     carboxymethylcellulose (REFRESH PLUS) 0.5 % SOLN Place 1 drop into both eyes 3 (three) times daily as needed (dry eyes).      Cholecalciferol (VITAMIN D-3) 1000 UNITS CAPS Take 1,000 Units by mouth in the morning.     dorzolamide-timolol (COSOPT) 22.3-6.8 MG/ML ophthalmic solution Place 1 drop into both eyes 2 (two)  times daily.     gabapentin (NEURONTIN) 300 MG capsule Take 1 capsule by mouth at bedtime 90 capsule 1   imatinib (GLEEVEC) 100 MG tablet Take 3 tablets (300 mg total) by mouth daily. Take with meals and large glass of water.Caution:Chemotherapy 90 tablet 2   latanoprost (XALATAN) 0.005 % ophthalmic solution Place 1 drop into both eyes at bedtime.     Lidocaine HCl 4 % CREA Apply 1 application topically 2 (two) times daily as needed (knee pain.).     lisinopril (ZESTRIL) 10 MG tablet Take 1 tablet (10 mg total) by mouth daily. 90 tablet 1   lisinopril (ZESTRIL) 30 MG tablet Take 1 tablet by mouth once daily 90 tablet 0   Multiple Vitamin (MULTIVITAMIN WITH MINERALS) TABS tablet Take 1 tablet by mouth daily.     nystatin ointment (MYCOSTATIN) Apply 1 Application topically 2 (two) times daily. 30 g 1   Omega-3 Fatty Acids (OMEGA 3 PO) Take 1,200 mg  by mouth in the morning.     ondansetron (ZOFRAN) 8 MG tablet Take 1 tablet (8 mg total) by mouth every 8 (eight) hours as needed for nausea or vomiting. 20 tablet 2   pantoprazole (PROTONIX) 40 MG tablet Take 1 tablet by mouth once daily 90 tablet 0   Zinc Oxide (DESITIN EX) Apply 1 application topically as needed.     No current facility-administered medications for this visit.    PHYSICAL EXAMINATION: ECOG PERFORMANCE STATUS: 0 - Asymptomatic  Vitals:   01/01/22 1355  BP: (!) 147/66  Pulse: 74  Resp: 18  Temp: 98.5 F (36.9 C)  SpO2: 100%   Wt Readings from Last 3 Encounters:  01/01/22 185 lb 1.6 oz (84 kg)  10/15/21 183 lb 3.2 oz (83.1 kg)  10/02/21 179 lb 12.8 oz (81.6 kg)    GENERAL:alert, no distress and comfortable SKIN: skin color, texture, turgor are normal, no rashes or significant lesions EYES: normal, Conjunctiva are pink and non-injected, sclera clear  NECK: supple, thyroid normal size, non-tender, without nodularity LYMPH:  no palpable lymphadenopathy in the cervical, axillary  LUNGS: (-) clear to auscultation and  percussion with normal breathing effort HEART:(-)  regular rate & rhythm and no murmurs and no lower extremity edema ABDOMEN:(-) abdomen soft, non-tender and normal bowel sounds Musculoskeletal:no cyanosis of digits and no clubbing  NEURO: alert & oriented x 3 with fluent speech, no focal motor/sensory deficits  LABORATORY DATA:  I have reviewed the data as listed    Latest Ref Rng & Units 01/01/2022    1:31 PM 10/02/2021   12:58 PM 09/29/2021    8:35 AM  CBC  WBC 4.0 - 10.5 K/uL 7.5  5.3  5.9   Hemoglobin 12.0 - 15.0 g/dL 10.3  9.7  10.7   Hematocrit 36.0 - 46.0 % 31.1  28.8  32.0   Platelets 150 - 400 K/uL 244  232  263         Latest Ref Rng & Units 01/01/2022    1:31 PM 12/30/2021    9:07 AM 10/02/2021   12:58 PM  CMP  Glucose 70 - 99 mg/dL 120   112   BUN 8 - 23 mg/dL 13   12   Creatinine 0.44 - 1.00 mg/dL 1.06  1.00  1.17   Sodium 135 - 145 mmol/L 138   133   Potassium 3.5 - 5.1 mmol/L 3.8   4.3   Chloride 98 - 111 mmol/L 106   107   CO2 22 - 32 mmol/L 28   24   Calcium 8.9 - 10.3 mg/dL 9.3   8.3   Total Protein 6.5 - 8.1 g/dL 7.4   6.5   Total Bilirubin 0.3 - 1.2 mg/dL 0.5   0.3   Alkaline Phos 38 - 126 U/L 61   73   AST 15 - 41 U/L 21   21   ALT 0 - 44 U/L 12   17       RADIOGRAPHIC STUDIES: I have personally reviewed the radiological images as listed and agreed with the findings in the report. No results found.    No orders of the defined types were placed in this encounter.  All questions were answered. The patient knows to call the clinic with any problems, questions or concerns. No barriers to learning was detected. The total time spent in the appointment was 30 minutes.     Yan Feng, MD 01/01/2022   I, LaChelle McNairy, CMA, am   acting as scribe for Yan Feng, MD.   I have reviewed the above documentation for accuracy and completeness, and I agree with the above.     

## 2022-01-01 ENCOUNTER — Encounter: Payer: Self-pay | Admitting: Hematology

## 2022-01-01 ENCOUNTER — Inpatient Hospital Stay: Payer: Medicare Other | Attending: Hematology

## 2022-01-01 ENCOUNTER — Inpatient Hospital Stay (HOSPITAL_BASED_OUTPATIENT_CLINIC_OR_DEPARTMENT_OTHER): Payer: Medicare Other | Admitting: Hematology

## 2022-01-01 ENCOUNTER — Other Ambulatory Visit: Payer: Self-pay

## 2022-01-01 DIAGNOSIS — C49A0 Gastrointestinal stromal tumor, unspecified site: Secondary | ICD-10-CM

## 2022-01-01 DIAGNOSIS — Z79899 Other long term (current) drug therapy: Secondary | ICD-10-CM | POA: Insufficient documentation

## 2022-01-01 DIAGNOSIS — C49A2 Gastrointestinal stromal tumor of stomach: Secondary | ICD-10-CM | POA: Insufficient documentation

## 2022-01-01 LAB — CBC WITH DIFFERENTIAL (CANCER CENTER ONLY)
Abs Immature Granulocytes: 0.01 10*3/uL (ref 0.00–0.07)
Basophils Absolute: 0 10*3/uL (ref 0.0–0.1)
Basophils Relative: 0 %
Eosinophils Absolute: 0.1 10*3/uL (ref 0.0–0.5)
Eosinophils Relative: 2 %
HCT: 31.1 % — ABNORMAL LOW (ref 36.0–46.0)
Hemoglobin: 10.3 g/dL — ABNORMAL LOW (ref 12.0–15.0)
Immature Granulocytes: 0 %
Lymphocytes Relative: 30 %
Lymphs Abs: 2.2 10*3/uL (ref 0.7–4.0)
MCH: 31.3 pg (ref 26.0–34.0)
MCHC: 33.1 g/dL (ref 30.0–36.0)
MCV: 94.5 fL (ref 80.0–100.0)
Monocytes Absolute: 0.6 10*3/uL (ref 0.1–1.0)
Monocytes Relative: 8 %
Neutro Abs: 4.5 10*3/uL (ref 1.7–7.7)
Neutrophils Relative %: 60 %
Platelet Count: 244 10*3/uL (ref 150–400)
RBC: 3.29 MIL/uL — ABNORMAL LOW (ref 3.87–5.11)
RDW: 13.1 % (ref 11.5–15.5)
WBC Count: 7.5 10*3/uL (ref 4.0–10.5)
nRBC: 0 % (ref 0.0–0.2)

## 2022-01-01 LAB — CMP (CANCER CENTER ONLY)
ALT: 12 U/L (ref 0–44)
AST: 21 U/L (ref 15–41)
Albumin: 4.3 g/dL (ref 3.5–5.0)
Alkaline Phosphatase: 61 U/L (ref 38–126)
Anion gap: 4 — ABNORMAL LOW (ref 5–15)
BUN: 13 mg/dL (ref 8–23)
CO2: 28 mmol/L (ref 22–32)
Calcium: 9.3 mg/dL (ref 8.9–10.3)
Chloride: 106 mmol/L (ref 98–111)
Creatinine: 1.06 mg/dL — ABNORMAL HIGH (ref 0.44–1.00)
GFR, Estimated: 54 mL/min — ABNORMAL LOW (ref >60)
Glucose, Bld: 120 mg/dL — ABNORMAL HIGH (ref 70–99)
Potassium: 3.8 mmol/L (ref 3.5–5.1)
Sodium: 138 mmol/L (ref 135–145)
Total Bilirubin: 0.5 mg/dL (ref 0.3–1.2)
Total Protein: 7.4 g/dL (ref 6.5–8.1)

## 2022-01-01 LAB — IRON AND IRON BINDING CAPACITY (CC-WL,HP ONLY)
Iron: 63 ug/dL (ref 28–170)
Saturation Ratios: 21 % (ref 10.4–31.8)
TIBC: 295 ug/dL (ref 250–450)
UIBC: 232 ug/dL (ref 148–442)

## 2022-01-01 LAB — FERRITIN: Ferritin: 132 ng/mL (ref 11–307)

## 2022-01-01 MED ORDER — IMATINIB MESYLATE 100 MG PO TABS: 300.0000 mg | ORAL_TABLET | Freq: Every day | ORAL | 2 refills | Status: DC

## 2022-01-13 ENCOUNTER — Telehealth: Payer: Self-pay

## 2022-01-13 ENCOUNTER — Encounter (HOSPITAL_COMMUNITY): Payer: Self-pay

## 2022-01-13 ENCOUNTER — Emergency Department (HOSPITAL_COMMUNITY)
Admission: EM | Admit: 2022-01-13 | Discharge: 2022-01-14 | Disposition: A | Discharge status: Home / Self Care | Payer: Medicare Other | Attending: Emergency Medicine | Admitting: Emergency Medicine

## 2022-01-13 ENCOUNTER — Other Ambulatory Visit: Payer: Self-pay

## 2022-01-13 DIAGNOSIS — Z7982 Long term (current) use of aspirin: Secondary | ICD-10-CM | POA: Insufficient documentation

## 2022-01-13 DIAGNOSIS — K6389 Other specified diseases of intestine: Secondary | ICD-10-CM | POA: Diagnosis not present

## 2022-01-13 DIAGNOSIS — K8020 Calculus of gallbladder without cholecystitis without obstruction: Secondary | ICD-10-CM | POA: Diagnosis not present

## 2022-01-13 DIAGNOSIS — R310 Gross hematuria: Secondary | ICD-10-CM | POA: Insufficient documentation

## 2022-01-13 DIAGNOSIS — D259 Leiomyoma of uterus, unspecified: Secondary | ICD-10-CM | POA: Diagnosis not present

## 2022-01-13 DIAGNOSIS — I10 Essential (primary) hypertension: Secondary | ICD-10-CM | POA: Diagnosis not present

## 2022-01-13 DIAGNOSIS — Z79899 Other long term (current) drug therapy: Secondary | ICD-10-CM | POA: Insufficient documentation

## 2022-01-13 DIAGNOSIS — R319 Hematuria, unspecified: Secondary | ICD-10-CM | POA: Diagnosis not present

## 2022-01-13 LAB — URINALYSIS, ROUTINE W REFLEX MICROSCOPIC
Bilirubin Urine: NEGATIVE
Glucose, UA: NEGATIVE mg/dL
Ketones, ur: NEGATIVE mg/dL
Leukocytes,Ua: NEGATIVE
Nitrite: NEGATIVE
Protein, ur: 30 mg/dL — AB
RBC / HPF: >50 RBC/hpf — ABNORMAL HIGH (ref 0–5)
Specific Gravity, Urine: 1.002 — ABNORMAL LOW (ref 1.005–1.030)
pH: 6 (ref 5.0–8.0)

## 2022-01-13 NOTE — ED Provider Triage Note (Signed)
Emergency Medicine Provider Triage Evaluation Note  Alison [name] , a 77 y.o. female  was evaluated in triage.  Pt complains of blood in her urine  Review of Systems  Positive: Blood in urine Negative: fever  Physical Exam  BP (!) 158/78 (BP Location: Left Arm)   Pulse 85   Temp 97.7 F (36.5 C) (Oral)   Resp 18   Ht '5\' 5"'$  (1.651 m)   Wt 83.9 kg   SpO2 100%   BMI 30.79 kg/m  Gen:   Awake, no distress   Resp:  Normal effort  MSK:   Moves extremities without difficulty Other:    Medical Decision Making  Medically screening exam initiated at 3:36 PM.  Appropriate orders placed.  Alison [name] was informed that the remainder of the evaluation will be completed by another provider, this initial triage assessment does not replace that evaluation, and the importance of remaining in the ED until their evaluation is complete.     Fransico Meadow, [place] 01/13/22 1537

## 2022-01-13 NOTE — Telephone Encounter (Signed)
Patient left a voicemail this afternoon concern about urination and saw blood, wanted to make an appointment with the doctor today.  Return Patient phone call and spoke with the daughter to let me know that is went to the hospital and also confirmed that she saw blood in her urine.  The daughter will have her mom to call back in the morning for appointment

## 2022-01-13 NOTE — ED Triage Notes (Signed)
Per EMS- Patient reports that she began having hematuria today x 2 episodes. Patient denies any urinary frequency, dysuria, or smell to her urine.

## 2022-01-14 ENCOUNTER — Emergency Department (HOSPITAL_COMMUNITY): Payer: Medicare Other

## 2022-01-14 DIAGNOSIS — R319 Hematuria, unspecified: Secondary | ICD-10-CM | POA: Diagnosis not present

## 2022-01-14 DIAGNOSIS — K6389 Other specified diseases of intestine: Secondary | ICD-10-CM | POA: Diagnosis not present

## 2022-01-14 DIAGNOSIS — D259 Leiomyoma of uterus, unspecified: Secondary | ICD-10-CM | POA: Diagnosis not present

## 2022-01-14 DIAGNOSIS — K8020 Calculus of gallbladder without cholecystitis without obstruction: Secondary | ICD-10-CM | POA: Diagnosis not present

## 2022-01-14 DIAGNOSIS — R310 Gross hematuria: Secondary | ICD-10-CM | POA: Diagnosis not present

## 2022-01-14 LAB — CBC WITH DIFFERENTIAL/PLATELET
Abs Immature Granulocytes: 0.02 10*3/uL (ref 0.00–0.07)
Basophils Absolute: 0 10*3/uL (ref 0.0–0.1)
Basophils Relative: 0 %
Eosinophils Absolute: 0.1 10*3/uL (ref 0.0–0.5)
Eosinophils Relative: 1 %
HCT: 33.8 % — ABNORMAL LOW (ref 36.0–46.0)
Hemoglobin: 11.5 g/dL — ABNORMAL LOW (ref 12.0–15.0)
Immature Granulocytes: 0 %
Lymphocytes Relative: 23 %
Lymphs Abs: 1.7 10*3/uL (ref 0.7–4.0)
MCH: 32.4 pg (ref 26.0–34.0)
MCHC: 34 g/dL (ref 30.0–36.0)
MCV: 95.2 fL (ref 80.0–100.0)
Monocytes Absolute: 0.6 10*3/uL (ref 0.1–1.0)
Monocytes Relative: 8 %
Neutro Abs: 4.8 10*3/uL (ref 1.7–7.7)
Neutrophils Relative %: 68 %
Platelets: 298 10*3/uL (ref 150–400)
RBC: 3.55 MIL/uL — ABNORMAL LOW (ref 3.87–5.11)
RDW: 13.3 % (ref 11.5–15.5)
WBC: 7.1 10*3/uL (ref 4.0–10.5)
nRBC: 0 % (ref 0.0–0.2)

## 2022-01-14 LAB — BASIC METABOLIC PANEL
Anion gap: 6 (ref 5–15)
BUN: 9 mg/dL (ref 8–23)
CO2: 23 mmol/L (ref 22–32)
Calcium: 8.8 mg/dL — ABNORMAL LOW (ref 8.9–10.3)
Chloride: 108 mmol/L (ref 98–111)
Creatinine, Ser: 0.8 mg/dL (ref 0.44–1.00)
GFR, Estimated: >60 mL/min (ref >60)
Glucose, Bld: 138 mg/dL — ABNORMAL HIGH (ref 70–99)
Potassium: 3.3 mmol/L — ABNORMAL LOW (ref 3.5–5.1)
Sodium: 137 mmol/L (ref 135–145)

## 2022-01-14 LAB — PROTIME-INR
INR: 1.1 (ref 0.8–1.2)
Prothrombin Time: 13.9 seconds (ref 11.4–15.2)

## 2022-01-14 MED ORDER — CEPHALEXIN 500 MG PO CAPS: 500.0000 mg | ORAL_CAPSULE | Freq: Three times a day (TID) | ORAL | 0 refills | Status: DC

## 2022-01-14 MED ORDER — SODIUM CHLORIDE 0.9 % IV BOLUS
500.0000 mL | Freq: Once | INTRAVENOUS | Status: AC
  Administered 2022-01-14: 500 mL via INTRAVENOUS

## 2022-01-14 MED ORDER — SODIUM CHLORIDE 0.9 % IV SOLN
1.0000 g | Freq: Once | INTRAVENOUS | Status: AC
  Administered 2022-01-14: 1 g via INTRAVENOUS
  Filled 2022-01-14: qty 10

## 2022-01-14 NOTE — ED Notes (Signed)
Bladder scanned with a result of 52m. Pt ambulated to the bathroom with limited assistance.

## 2022-01-14 NOTE — ED Provider Notes (Signed)
[place] DEPT Provider Note   CSN: [id number] Arrival date & time: 01/13/22  1351     History  Chief Complaint  Patient presents with   Hematuria    Alison [name] is a 77 y.o. female.  Patient here with blood in her urine since yesterday.  She reports the urine is "red and pink" but she is urinating normal amounts.  She has no frequency, urgency, dysuria.  She never had this happen before.  No history of kidney stones.  No flank pain.  No abdominal pain.  No fevers, chills, nausea or vomiting.  No blood thinner use other than aspirin.  She does have a history of a GIST tumor is on [place] but reports this is stable.  States she is no longer diabetic and not on metformin.  Does have hypertension, previous diverticulitis surgery with bowel resection, GERD, gastritis.  She denies any pain currently.  No dizziness or lightheadedness.  No fevers.  No previous history of kidney stones.  The history is provided by the patient.  Hematuria Pertinent negatives include no chest pain, no abdominal pain, no headaches and no shortness of breath.       Home Medications Prior to Admission medications   Medication Sig Start Date End Date Taking? Authorizing Provider  acetaminophen (TYLENOL) 650 MG CR tablet Take 1,300 mg by mouth as needed.    [provider]  amLODipine (NORVASC) 10 MG tablet Take 1 tablet (10 mg total) by mouth daily. 10/01/21   Ngetich, Dinah C, NP  aspirin EC 81 MG tablet Take 81 mg by mouth daily.    [provider]  atorvastatin (LIPITOR) 40 MG tablet TAKE 1 TABLET BY MOUTH ONCE DAILY AT  6PM 08/26/21   Ngetich, Dinah C, NP  Calcium Carb-Cholecalciferol (CALCIUM 600 + D PO) Take 1 tablet by mouth in the morning.    [provider]  carboxymethylcellulose (REFRESH PLUS) 0.5 % SOLN Place 1 drop into both eyes 3 (three) times daily as needed (dry eyes).     [provider]  Cholecalciferol (VITAMIN D-3) 1000  UNITS CAPS Take 1,000 Units by mouth in the morning.    [provider]  dorzolamide-timolol (COSOPT) 22.3-6.8 MG/ML ophthalmic solution Place 1 drop into both eyes 2 (two) times daily. 09/01/20   [provider]  gabapentin (NEURONTIN) 300 MG capsule Take 1 capsule by mouth at bedtime 07/28/21   Ngetich, Dinah C, NP  imatinib (GLEEVEC) 100 MG tablet Take 3 tablets (300 mg total) by mouth daily. Take with meals and large glass of water.Caution:Chemotherapy 01/01/22   Truitt Merle, MD  latanoprost (XALATAN) 0.005 % ophthalmic solution Place 1 drop into both eyes at bedtime.    [provider]  Lidocaine HCl 4 % CREA Apply 1 application topically 2 (two) times daily as needed (knee pain.).    [provider]  lisinopril (ZESTRIL) 10 MG tablet Take 1 tablet (10 mg total) by mouth daily. 10/01/21   Ngetich, Dinah C, NP  lisinopril (ZESTRIL) 30 MG tablet Take 1 tablet by mouth once daily 12/12/21   Ngetich, Dinah C, NP  Multiple Vitamin (MULTIVITAMIN WITH MINERALS) TABS tablet Take 1 tablet by mouth daily.    [provider]  nystatin ointment (MYCOSTATIN) Apply 1 Application topically 2 (two) times daily. 09/19/21   Medina-Vargas, Monina C, NP  Omega-3 Fatty Acids (OMEGA 3 PO) Take 1,200 mg by mouth in the morning.    [provider]  ondansetron [facility]) 8  MG tablet Take 1 tablet (8 mg total) by mouth every 8 (eight) hours as needed for nausea or vomiting. 10/02/21   Truitt Merle, MD  pantoprazole (PROTONIX) 40 MG tablet Take 1 tablet by mouth once daily 12/08/21   Ladene Artist, MD  Zinc Oxide (DESITIN EX) Apply 1 application topically as needed.    [provider]      Allergies    Aspirin    Review of Systems   Review of Systems  Constitutional:  Negative for activity change, appetite change and fever.  HENT:  Negative for congestion and rhinorrhea.   Respiratory:  Negative for cough, chest tightness and shortness of breath.    Cardiovascular:  Negative for chest pain.  Gastrointestinal:  Negative for abdominal pain, nausea and vomiting.  Genitourinary:  Positive for hematuria. Negative for dysuria, urgency, vaginal bleeding and vaginal discharge.  Musculoskeletal:  Negative for arthralgias and myalgias.  Skin:  Negative for rash.  Neurological:  Negative for weakness, light-headedness, numbness and headaches.   all other systems are negative except as noted in the HPI and PMH.    Physical Exam Updated Vital Signs BP (!) 145/72 (BP Location: Left Arm)   Pulse 78   Temp 98.2 F (36.8 C) (Oral)   Resp 16   Ht '5\' 5"'$  (1.651 m)   Wt 83.9 kg   SpO2 100%   BMI 30.79 kg/m  Physical Exam Vitals and nursing note reviewed.  Constitutional:      General: She is not in acute distress.    Appearance: She is well-developed.  HENT:     Head: Normocephalic and atraumatic.     Mouth/Throat:     Pharynx: No oropharyngeal exudate.  Eyes:     Conjunctiva/sclera: Conjunctivae normal.     Pupils: Pupils are equal, round, and reactive to light.  Neck:     Comments: No meningismus. Cardiovascular:     Rate and Rhythm: Normal rate and regular rhythm.     Heart sounds: Normal heart sounds. No murmur heard. Pulmonary:     Effort: Pulmonary effort is normal. No respiratory distress.     Breath sounds: Normal breath sounds.  Chest:     Chest wall: No tenderness.  Abdominal:     Palpations: Abdomen is soft.     Tenderness: There is no abdominal tenderness. There is no guarding or rebound.  Musculoskeletal:        General: No tenderness. Normal range of motion.     Cervical back: Normal range of motion and neck supple.     Comments: No CVAT  Skin:    General: Skin is warm.  Neurological:     Mental Status: She is alert and oriented to person, place, and time.     Cranial Nerves: No cranial nerve deficit.     Motor: No abnormal muscle tone.     Coordination: Coordination normal.     Comments:  5/5 strength  throughout. CN 2-12 intact.Equal grip strength.   Psychiatric:        Behavior: Behavior normal.     ED Results / Procedures / Treatments   Labs (all labs ordered are listed, but only abnormal results are displayed) Labs Reviewed  URINALYSIS, ROUTINE W REFLEX MICROSCOPIC - Abnormal; Notable for the following components:      Result Value   Color, Urine RED (*)    Specific Gravity, Urine 1.002 (*)    Hgb urine dipstick MODERATE (*)    Protein, ur 30 (*)  RBC / HPF >50 (*)    Bacteria, UA RARE (*)    All other components within normal limits  CBC WITH DIFFERENTIAL/PLATELET - Abnormal; Notable for the following components:   RBC 3.55 (*)    Hemoglobin 11.5 (*)    HCT 33.8 (*)    All other components within normal limits  BASIC METABOLIC PANEL - Abnormal; Notable for the following components:   Potassium 3.3 (*)    Glucose, Bld 138 (*)    Calcium 8.8 (*)    All other components within normal limits  URINE CULTURE  PROTIME-INR    EKG None  Radiology CT Renal Stone Study  Result Date: 01/14/2022 CLINICAL DATA:  Hematuria. EXAM: CT ABDOMEN AND PELVIS WITHOUT CONTRAST TECHNIQUE: Multidetector CT imaging of the abdomen and pelvis was performed following the standard protocol without IV contrast. RADIATION DOSE REDUCTION: This exam was performed according to the departmental dose-optimization program which includes automated exposure control, adjustment of the mA and/or kV according to patient size and/or use of iterative reconstruction technique. COMPARISON:  CT December 30, 2021 FINDINGS: Lower chest: No acute abnormality. Hepatobiliary: Unremarkable noncontrast enhanced appearance of the hepatic parenchyma. Pneumobilia again seen likely reflecting sequela of prior sphincterotomy. Cholelithiasis without findings of acute cholecystitis. Pancreas: No pancreatic ductal dilation or evidence of acute inflammation. Spleen: No splenomegaly. Adrenals/Urinary Tract: Bilateral adrenal glands  appear normal. No hydronephrosis. No renal, ureteral or bladder calculi identified. Stable fluid calcified 12 mm focus in the right renal hilum on image 22/2 previously characterized as a thrombosed aneurysm. Urinary bladder is unremarkable for degree of distension. Stomach/Bowel: Surgical changes of prior partial gastric resection. No pathologic dilation of small or large bowel. Subtotal colectomy with ileocolonic anastomosis. No evidence of acute bowel inflammation. Vascular/Lymphatic: Aortic atherosclerosis. No pathologically enlarged abdominal or pelvic lymph nodes. Reproductive: Leiomyomatous uterus. Other: Ventral abdominal wall laxity with diastasis rectus as before. No significant abdominopelvic free fluid. Musculoskeletal: No acute osseous abnormality. Multilevel degenerative changes spine with similar grade 1/2 anterolisthesis of L4 on L5. IMPRESSION: 1. No hydronephrosis. No renal, ureteral or bladder calculi identified. 2. Cholelithiasis without findings of acute cholecystitis. 3. Leiomyomatous uterus. 4. Subtotal colectomy with ileocolonic anastomosis. No evidence of acute bowel inflammation. 5. Stable 12 mm fluid calcified focus in the right renal hilum previously characterized as a thrombosed aneurysm. 6.  Aortic Atherosclerosis (ICD10-I70.0). Electronically Signed   By: Dahlia Bailiff M.D.   On: 01/14/2022 08:57    Procedures Procedures    Medications Ordered in ED Medications  sodium chloride 0.9 % bolus 500 mL (has no administration in time range)  cefTRIAXone (ROCEPHIN) 1 g in sodium chloride 0.9 % 100 mL IVPB (has no administration in time range)    ED Course/ Medical Decision Making/ A&P                           Medical Decision Making Amount and/or Complexity of Data Reviewed Independent Historian: caregiver Labs: ordered. Decision-making details documented in ED Course. Radiology: ordered and independent interpretation performed. Decision-making details documented in ED  Course. ECG/medicine tests: ordered and independent interpretation performed. Decision-making details documented in ED Course.  Risk Prescription drug management.   Painless hematuria since yesterday.  Vital stable, no distress, no fever.  No abdominal pain or flank pain.  Urinalysis consistent with gross hematuria, will send for culture.  CT scan obtained in triage shows no renal or bladder mass or hydronephrosis or kidney stone.  Does have stable  postsurgical changes of her anastomosis.  Patient and family aware of renal artery aneurysm.  Treat empirically for suspected hemorrhagic cystitis.  Discussed with patient and family that they will need urology follow-up for consideration of cystoscopy if hematuria does not improve with antibiotics.  Creatinine is at baseline.  Hemoglobin is stable.  Patient given dose of IV Rocephin in the ED while urine culture is pending.  Bladder scan shows 0 mL of urinary retention.  Discussed empiric antibiotics for suspected hemorrhagic cystitis.  Follow-up with urology if hematuria persists first possible cystoscopy.  Return to the ED with new or worsening symptoms.        Final Clinical Impression(s) / ED Diagnoses Final diagnoses:  Gross hematuria    Rx / DC Orders ED Discharge Orders     None         Shaquera Ansley, Annie Main, MD 01/14/22 1242

## 2022-01-14 NOTE — Discharge Instructions (Signed)
Take the antibiotic for a possible infection.  As we discussed if the blood in the urine does not improve he should follow-up with the urologist for further evaluation with a test called a cystoscopy.  Your CT scan today was stable and did not show any new masses involving her kidney or bladder.  There is an aneurysm of your renal artery which your doctor needs to follow as you are already doing. Return to the ED with new or worsening symptoms

## 2022-01-15 LAB — URINE CULTURE: Culture: NO GROWTH

## 2022-01-16 DIAGNOSIS — R310 Gross hematuria: Secondary | ICD-10-CM | POA: Diagnosis not present

## 2022-01-16 DIAGNOSIS — R311 Benign essential microscopic hematuria: Secondary | ICD-10-CM | POA: Diagnosis not present

## 2022-01-21 ENCOUNTER — Other Ambulatory Visit: Payer: Self-pay | Admitting: Family

## 2022-01-21 DIAGNOSIS — E1149 Type 2 diabetes mellitus with other diabetic neurological complication: Secondary | ICD-10-CM

## 2022-01-26 DIAGNOSIS — R310 Gross hematuria: Secondary | ICD-10-CM | POA: Diagnosis not present

## 2022-01-27 DIAGNOSIS — Z96653 Presence of artificial knee joint, bilateral: Secondary | ICD-10-CM | POA: Diagnosis not present

## 2022-01-27 DIAGNOSIS — M25562 Pain in left knee: Secondary | ICD-10-CM | POA: Diagnosis not present

## 2022-01-27 DIAGNOSIS — M25561 Pain in right knee: Secondary | ICD-10-CM | POA: Diagnosis not present

## 2022-01-28 ENCOUNTER — Ambulatory Visit (INDEPENDENT_AMBULATORY_CARE_PROVIDER_SITE_OTHER): Payer: Medicare Other | Admitting: Podiatry

## 2022-01-28 VITALS — BP 116/59

## 2022-01-28 DIAGNOSIS — E1142 Type 2 diabetes mellitus with diabetic polyneuropathy: Secondary | ICD-10-CM

## 2022-01-28 DIAGNOSIS — B351 Tinea unguium: Secondary | ICD-10-CM | POA: Diagnosis not present

## 2022-01-28 DIAGNOSIS — L84 Corns and callosities: Secondary | ICD-10-CM

## 2022-01-28 DIAGNOSIS — M79675 Pain in left toe(s): Secondary | ICD-10-CM

## 2022-01-28 DIAGNOSIS — M79674 Pain in right toe(s): Secondary | ICD-10-CM | POA: Diagnosis not present

## 2022-01-28 NOTE — Progress Notes (Signed)
  Subjective:  Patient ID: Alison [name], female    DOB: 1945-05-16,  MRN: [id number]  Chatara Lucente presents to clinic today for at risk foot care with history of diabetic neuropathy and corn(s) b/l lower extremities, callus(es) b/l lower extremities and painful mycotic nails.  Pain interferes with ambulation. Aggravating factors include wearing enclosed shoe gear. Painful toenails interfere with ambulation. Aggravating factors include wearing enclosed shoe gear. Pain is relieved with periodic professional debridement. Painful corns and calluses are aggravated when weightbearing with and without shoegear. Pain is relieved with periodic professional debridement.  Chief Complaint  Patient presents with   Nail Problem    RFC PCP-Ngetich PCP VST-09/2021   New problem(s): None.   PCP is Ngetich, Nelda Bucks, NP.  Allergies  Allergen Reactions   Aspirin Nausea Only and Nausea And Vomiting    Can take 81 mg but can't take '325mg'$ , hard on stomach. Can take 81 mg but can't take '325mg'$ , hard on stomach.    Review of Systems: Negative except as noted in the HPI.  Objective: No changes noted in today's physical examination. Vitals:   01/28/22 1325  BP: (!) 116/59   Alison [name] is a pleasant 77 y.o. female in NAD. AAO x 3.  Vascular Examination: CFT <3 seconds b/l LE. Faintly palpable pedal pulses b/l LE. Pedal hair absent b/l LE. Skin temperature gradient WNL b/l. No pain with calf compression b/l. No edema b/l LE. No cyanosis or clubbing noted b/l LE.  Dermatological Examination: Pedal skin thin and atrophic b/l LE. No open wounds b/l LE. No interdigital macerations noted b/l LE. Toenails 1-5 b/l elongated, discolored, dystrophic, thickened, crumbly with subungual debris and tenderness to dorsal palpation.   Hyperkeratotic lesion(s) lateral nail border bilateral 5th toes and submet head 5 b/l.  No erythema, no edema, no drainage, no fluctuance.  Macule, tear drop shaped, noted lateral  aspect right 5th metatarsal head measuring 0.5 x 0.3 cm, flat. Vareigated color. No elevation, no palpable subcutaneous depth. No bleeding  Musculoskeletal Examination: Normal muscle strength 5/5 to all lower extremity muscle groups bilaterally. HAV with bunion deformity noted b/l LE. Hammertoe deformity noted 2-5 b/l. No pain, crepitus or joint limitation noted with ROM b/l LE.  Patient ambulates independently without assistive aids.  Neurological Examination: Pt has subjective symptoms of neuropathy. Protective sensation decreased with 10 gram monofilament b/l. Vibratory sensation intact b/l.  Assessment/Plan: 1. Pain due to onychomycosis of toenails of both feet   2. Corns and callosities   3. Diabetic peripheral neuropathy associated with type 2 diabetes mellitus ([place])     No orders of the defined types were placed in this encounter.   -Patient was evaluated and treated. All patient's and/or POA's questions/concerns answered on today's visit. -Continue foot and shoe inspections daily. Monitor blood glucose per PCP/Endocrinologist's recommendations. -Continue supportive shoe gear daily. -Toenails 1-5 b/l were debrided in length and girth with sterile nail nippers and dremel without iatrogenic bleeding.  -Corn(s) lateral nailfold b/l 5th digits pared utilizing sterile scalpel blade without complication or incident. Total number debrided=2. -Callus(es) submet head 5 b/l pared utilizing sterile scalpel blade without complication or incident. Total number debrided =2. -Patient/POA to call should there be question/concern in the interim.   Return in about 4 months (around 05/29/2022).  Marzetta Board, DPM

## 2022-02-01 ENCOUNTER — Encounter: Payer: Self-pay | Admitting: Podiatry

## 2022-02-15 ENCOUNTER — Other Ambulatory Visit: Payer: Self-pay | Admitting: Family

## 2022-02-15 DIAGNOSIS — E1169 Type 2 diabetes mellitus with other specified complication: Secondary | ICD-10-CM

## 2022-03-03 ENCOUNTER — Ambulatory Visit: Payer: Medicare Other | Admitting: Family

## 2022-03-10 ENCOUNTER — Ambulatory Visit (INDEPENDENT_AMBULATORY_CARE_PROVIDER_SITE_OTHER): Payer: Medicare Other | Admitting: Family

## 2022-03-10 ENCOUNTER — Encounter: Payer: Self-pay | Admitting: Family

## 2022-03-10 VITALS — BP 130/60 | HR 73 | Temp 97.5°F | Resp 20 | Ht 65.0 in | Wt 186.6 lb

## 2022-03-10 DIAGNOSIS — R319 Hematuria, unspecified: Secondary | ICD-10-CM

## 2022-03-10 DIAGNOSIS — M79605 Pain in left leg: Secondary | ICD-10-CM

## 2022-03-10 NOTE — Progress Notes (Signed)
Provider: Marlowe Sax FNP-C  Vonzell Lindblad, Nelda Bucks, NP  Patient Care Team: Jenny Omdahl, Nelda Bucks, NP as PCP - General (Family Medicine) Shirley Muscat Loreen Freud, MD as Referring Physician (Optometry) Latanya Maudlin, MD as Consulting Physician (Orthopedic Surgery) Warden Fillers, MD as Consulting Physician (Ophthalmology) Ladene Artist, MD as Consulting Physician (Gastroenterology) Abbie Sons, MD (Urology) Stechschulte, Nickola Major, MD as Consulting Physician (Surgery) Truitt Merle, MD as Consulting Physician (Hematology)  Extended Emergency Contact Information Primary Emergency Contact: Chiarelli,[name] Home Phone: [phone] Mobile Phone: [phone] Relation: Daughter Secondary Emergency Contact: [place], [place] [address] [name] of [place] Phone: [phone] Mobile Phone: [phone] Relation: Sister  Code Status:  Full Code  Goals of care: Advanced Directive information    10/15/2021    9:28 AM  Advanced Directives  Does Patient Have a Medical Advance Directive? No  Would patient like information on creating a medical advance directive? No - Patient declined     Chief Complaint  Patient presents with   Acute Visit    Patient presents today for medication review.    HPI:  Pt is a 77 y.o. female seen today for an acute visit for evaluation of blood in the urine.states blood in the urine has stopped.Last saw the blood in the urine on 02/26/2022. Has been off the Asprin from 01/16/2022. States no blood since 02/26/2022 but states would like to fng out why it keeps coming and going. She has seen by oncologist but not gynecology   Follows up with Orthopedic for knee pain.states Prednisone was recommended but declined states runs her sugar. Has been taking Tylenol and volteren gel   She complains of leg pain.Has shooting pain on the legs intermittent.she wears knee high compression stockings. On Gabapentin.    Past Medical History:  Diagnosis  Date   Anxiety    Benign essential hypertension    Blood transfusion without reported diagnosis    Cataract    removed bilateraly   Diabetes mellitus without complication ([place])    Diverticulitis of colon (without mention of hemorrhage)(562.11)    GERD (gastroesophageal reflux disease)    Goiter, specified as simple    Helicobacter pylori gastritis    Hyperlipidemia LDL goal < 100    Intrahepatic bile duct dilation    Numbness    TOES RT FOOT and toes left foot   Obesity, unspecified    Osteoarthrosis, unspecified whether generalized or localized, lower leg    Osteopenia    Other specified disease of nail    FUNGUS OF FINGERNAILS   Pernicious anemia    Unspecified glaucoma(365.9)    Unspecified vitamin D deficiency    Wears dentures    Wears glasses    Past Surgical History:  Procedure Laterality Date   BACK SURGERY     CATARACT EXTRACTION     right eye 06/20/15 left eye 09/12/15   COLECTOMY  2009   COLONOSCOPY  09/06/2007   Dr Carol Ada   DILATION AND CURETTAGE OF UTERUS     ENDOSCOPIC RETROGRADE CHOLANGIOPANCREATOGRAPHY (ERCP) WITH PROPOFOL N/A 03/17/2019   Procedure: ENDOSCOPIC RETROGRADE CHOLANGIOPANCREATOGRAPHY (ERCP) WITH PROPOFOL;  Surgeon: Ladene Artist, MD;  [facility] END[facility]COPY;  Service: Endoscopy;  Laterality: N/A;   JOINT REPLACEMENT     right   LUMBAR LAMINECTOMY/DECOMPRESSION MICRODISCECTOMY N/A 03/30/2013   Procedure: CENTRAL DECOMPRESSION L4 - L5 AND EXCISION 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;  Surgeon: Tobi Bastos, MD;  Location: [facility] OR[facility];  Service: Orthopedics;  Laterality: N/A;   MULTIPLE TOOTH EXTRACTIONS     REMOVAL OF STONES  03/17/2019   Procedure: REMOVAL OF STONES;  Surgeon: Ladene Artist, MD;  Location: [facility];  Service: Endoscopy;;   SIGMOIDOSCOPY  02/16/2018   Dr Nira Conn  03/17/2019   Procedure: SPHINCTEROTOMY;  Surgeon: Ladene Artist, MD;  Location: [place];  Service: Endoscopy;;   TOTAL KNEE ARTHROPLASTY Right  02/01/2013   Procedure: RIGHT TOTAL KNEE ARTHROPLASTY;  Surgeon: Tobi Bastos, MD;  Location: [facility];  Service: Orthopedics;  Laterality: Right;   TOTAL KNEE ARTHROPLASTY Left 04/18/2014   Procedure: LEFT TOTAL KNEE ARTHROPLASTY;  Surgeon: Latanya Maudlin, MD;  Location: [facility];  Service: Orthopedics;  Laterality: Left;    Allergies  Allergen Reactions   Aspirin Nausea Only and Nausea And Vomiting    Can take 81 mg but can't take '325mg'$ , hard on stomach. Can take 81 mg but can't take '325mg'$ , hard on stomach.    Outpatient Encounter Medications as of 03/10/2022  Medication Sig   acetaminophen (TYLENOL) 650 MG CR tablet Take 1,300 mg by mouth as needed.   amLODipine (NORVASC) 10 MG tablet Take 1 tablet (10 mg total) by mouth daily.   atorvastatin (LIPITOR) 40 MG tablet TAKE 1 TABLET BY MOUTH ONCE DAILY AT 6 PM   Calcium Carb-Cholecalciferol (CALCIUM 600 + D PO) Take 1 tablet by mouth in the morning.   carboxymethylcellulose (REFRESH PLUS) 0.5 % SOLN Place 1 drop into both eyes 3 (three) times daily as needed (dry eyes).    Cholecalciferol (VITAMIN D-3) 1000 UNITS CAPS Take 1,000 Units by mouth in the morning.   dorzolamide-timolol (COSOPT) 22.3-6.8 MG/ML ophthalmic solution Place 1 drop into both eyes 2 (two) times daily.   gabapentin (NEURONTIN) 300 MG capsule Take 1 capsule by mouth at bedtime   imatinib (GLEEVEC) 100 MG tablet Take 3 tablets (300 mg total) by mouth daily. Take with meals and large glass of water.Caution:Chemotherapy   latanoprost (XALATAN) 0.005 % ophthalmic solution Place 1 drop into both eyes at bedtime.   Lidocaine HCl 4 % CREA Apply 1 application topically 2 (two) times daily as needed (knee pain.).   lisinopril (ZESTRIL) 10 MG tablet Take 1 tablet (10 mg total) by mouth daily.   lisinopril (ZESTRIL) 30 MG tablet Take 1 tablet by mouth once daily   Multiple Vitamin (MULTIVITAMIN WITH MINERALS) TABS tablet Take 1 tablet by mouth daily.   Omega-3 Fatty Acids (OMEGA 3 PO)  Take 1,200 mg by mouth in the morning.   ondansetron (ZOFRAN) 8 MG tablet Take 1 tablet (8 mg total) by mouth every 8 (eight) hours as needed for nausea or vomiting.   pantoprazole (PROTONIX) 40 MG tablet Take 1 tablet by mouth once daily   Zinc Oxide (DESITIN EX) Apply 1 application topically as needed.   aspirin EC 81 MG tablet Take 81 mg by mouth daily. (Patient not taking: Reported on 03/10/2022)   nystatin ointment (MYCOSTATIN) Apply 1 Application topically 2 (two) times daily. (Patient not taking: Reported on 03/10/2022)   [DISCONTINUED] cephALEXin (KEFLEX) 500 MG capsule Take 1 capsule (500 mg total) by mouth 3 (three) times daily.   No facility-administered encounter medications on file as of 03/10/2022.    Review of Systems  Constitutional:  Negative for appetite change, chills, fatigue, fever and unexpected weight change.  HENT:  Negative for congestion, dental problem, ear discharge, ear pain, facial swelling, hearing loss, nosebleeds, postnasal drip, rhinorrhea, sinus  pressure, sinus pain, sneezing, sore throat, tinnitus and trouble swallowing.   Eyes:  Negative for pain, discharge, redness, itching and visual disturbance.  Respiratory:  Negative for cough, chest tightness, shortness of breath and wheezing.   Cardiovascular:  Negative for chest pain, palpitations and leg swelling.  Gastrointestinal:  Negative for abdominal distention, abdominal pain, blood in stool, constipation, diarrhea, nausea and vomiting.  Endocrine: Negative for cold intolerance, heat intolerance, polydipsia, polyphagia and polyuria.  Genitourinary:  Positive for frequency and hematuria. Negative for difficulty urinating, dysuria, flank pain, urgency, vaginal bleeding, vaginal discharge and vaginal pain.  Musculoskeletal:  Positive for arthralgias and gait problem. Negative for back pain, joint swelling, myalgias, neck pain and neck stiffness.  Skin:  Negative for color change, pallor, rash and wound.   Neurological:  Negative for dizziness, syncope, speech difficulty, weakness, light-headedness, numbness and headaches.  Hematological:  Does not bruise/bleed easily.  Psychiatric/Behavioral:  Negative for agitation, behavioral problems, confusion, hallucinations, self-injury, sleep disturbance and suicidal ideas. The patient is not nervous/anxious.     Immunization History  Administered Date(s) Administered   Fluad Quad(high Dose 65+) 09/05/2018, 09/26/2020, 10/01/2021, 10/02/2021   Influenza, High Dose Seasonal PF 10/02/2017, 11/20/2019   Influenza,inj,Quad PF,6+ Mos 10/03/2012, 09/21/2014, 11/07/2015   Influenza,inj,quad, With Preservative 11/12/2016   Influenza-Unspecified 10/28/2013   PFIZER(Purple Top)SARS-COV-2 Vaccination 03/25/2019, 03/25/2019, 04/18/2019, 11/01/2019, 05/23/2020, 10/07/2020   Pneumococcal Conjugate-13 01/04/2014, 01/04/2014, 11/04/2016   Pneumococcal Polysaccharide-23 12/29/2012, 11/19/2017   Zoster Recombinat (Shingrix) 11/25/2016, 01/26/2017   Pertinent  Health Maintenance Due  Topic Date Due   OPHTHALMOLOGY EXAM  09/23/2021   FOOT EXAM  02/19/2022   HEMOGLOBIN A1C  03/30/2022   COLONOSCOPY (Pts 45-73yr Insurance coverage will need to be confirmed)  02/17/2023   INFLUENZA VACCINE  Completed   DEXA SCAN  Completed      10/15/2021    9:28 AM 01/01/2022    2:00 PM 01/13/2022    3:03 PM 01/14/2022   11:46 AM 03/10/2022    1:28 PM  FHallidayin the past year? 0    0  Was there an injury with Fall? 0    0  Fall Risk Category Calculator 0    0  Fall Risk Category (Retired) Low      (RETIRED) Patient Fall Risk Level Low fall risk Low fall risk Low fall risk Low fall risk   Patient at Risk for Falls Due to No Fall Risks    No Fall Risks  Fall risk Follow up Falls evaluation completed    Falls evaluation completed   Functional Status Survey:    Vitals:   03/10/22 1329  BP: 130/60  Pulse: 73  Resp: 20  Temp: (!) 97.5 F (36.4 C)  SpO2: 99%   Weight: 186 lb 9.6 oz (84.6 kg)  Height: '5\' 5"'$  (1.651 m)   Body mass index is 31.05 kg/m. Physical Exam Vitals reviewed.  Constitutional:      General: She is not in acute distress.    Appearance: Normal appearance. She is obese. She is not ill-appearing or diaphoretic.  HENT:     Head: Normocephalic.     Right Ear: Tympanic membrane, ear canal and external ear normal. There is no impacted cerumen.     Left Ear: Tympanic membrane, ear canal and external ear normal. There is no impacted cerumen.     Nose: Nose normal. No congestion or rhinorrhea.     Mouth/Throat:     Mouth: Mucous membranes are moist.  Pharynx: Oropharynx is clear. No oropharyngeal exudate or posterior oropharyngeal erythema.  Eyes:     General: No scleral icterus.       Right eye: No discharge.        Left eye: No discharge.     Extraocular Movements: Extraocular movements intact.     Conjunctiva/sclera: Conjunctivae normal.     Pupils: Pupils are equal, round, and reactive to light.  Neck:     Vascular: No carotid bruit.  Cardiovascular:     Rate and Rhythm: Normal rate and regular rhythm.     Pulses: Normal pulses.     Heart sounds: Normal heart sounds. No murmur heard.    No friction rub. No gallop.  Pulmonary:     Effort: Pulmonary effort is normal. No respiratory distress.     Breath sounds: Normal breath sounds. No wheezing, rhonchi or rales.  Chest:     Chest wall: No tenderness.  Abdominal:     General: Bowel sounds are normal. There is no distension.     Palpations: Abdomen is soft. There is no mass.     Tenderness: There is no abdominal tenderness. There is no right CVA tenderness, left CVA tenderness, guarding or rebound.  Musculoskeletal:        General: No swelling or tenderness. Normal range of motion.     Cervical back: Normal range of motion. No rigidity or tenderness.     Right lower leg: No edema.     Left lower leg: No edema.  Lymphadenopathy:     Cervical: No cervical  adenopathy.  Skin:    General: Skin is warm and dry.     Coloration: Skin is not pale.     Findings: No bruising, erythema, lesion or rash.  Neurological:     Mental Status: She is alert and oriented to person, place, and time.     Cranial Nerves: No cranial nerve deficit.     Sensory: No sensory deficit.     Motor: No weakness.     Coordination: Coordination normal.     Gait: Gait normal.  Psychiatric:        Mood and Affect: Mood normal.        Speech: Speech normal.        Behavior: Behavior normal.        Thought Content: Thought content normal.        Judgment: Judgment normal.     Labs reviewed: Recent Labs    10/02/21 1258 12/30/21 0907 01/01/22 1331 01/14/22 1127  NA 133*  --  138 137  K 4.3  --  3.8 3.3*  CL 107  --  106 108  CO2 24  --  28 23  GLUCOSE 112*  --  120* 138*  BUN 12  --  13 9  CREATININE 1.17* 1.00 1.06* 0.80  CALCIUM 8.3*  --  9.3 8.8*   Recent Labs    07/22/21 1114 09/29/21 0835 10/02/21 1258 01/01/22 1331  AST '21 23 21 21  '$ ALT '12 15 17 12  '$ ALKPHOS 53  --  73 61  BILITOT 0.6 0.6 0.3 0.5  PROT 7.0 6.8 6.5 7.4  ALBUMIN 3.9  --  3.9 4.3   Recent Labs    10/02/21 1258 01/01/22 1331 01/14/22 1127  WBC 5.3 7.5 7.1  NEUTROABS 3.1 4.5 4.8  HGB 9.7* 10.3* 11.5*  HCT 28.8* 31.1* 33.8*  MCV 91.4 94.5 95.2  PLT 232 244 298   Lab Results  Component Value  Date   TSH 0.62 09/29/2021   Lab Results  Component Value Date   HGBA1C 5.3 09/29/2021   Lab Results  Component Value Date   CHOL 95 09/29/2021   HDL 37 (L) 09/29/2021   LDLCALC 44 09/29/2021   TRIG 62 09/29/2021   CHOLHDL 2.6 09/29/2021    Significant Diagnostic Results in last 30 days:  No results found.  Assessment/Plan 1. Hematuria, unspecified type Afebrile  -will obtain urine specimen to rule out UTI patient voided upon arrival to office prior to being seen unable to provide specimen. - Urine Culture; Future  2. Pain of left lower extremity - No  swelling,erythema or open wound on the left leg.suspect pain due to varicose vein.  Continue current pain regimen  Family/ staff Communication: Reviewed plan of care with patient verbalized understanding   Labs/tests ordered: return urine specimen for culture and sensitivity in the morning.   Next Appointment: Return if symptoms worsen or fail to improve.   Sandrea Hughs, NP

## 2022-03-11 ENCOUNTER — Other Ambulatory Visit: Payer: Self-pay

## 2022-03-11 DIAGNOSIS — H35033 Hypertensive retinopathy, bilateral: Secondary | ICD-10-CM | POA: Diagnosis not present

## 2022-03-11 DIAGNOSIS — E119 Type 2 diabetes mellitus without complications: Secondary | ICD-10-CM | POA: Diagnosis not present

## 2022-03-11 DIAGNOSIS — H43813 Vitreous degeneration, bilateral: Secondary | ICD-10-CM | POA: Diagnosis not present

## 2022-03-11 DIAGNOSIS — H401131 Primary open-angle glaucoma, bilateral, mild stage: Secondary | ICD-10-CM | POA: Diagnosis not present

## 2022-03-11 DIAGNOSIS — H26493 Other secondary cataract, bilateral: Secondary | ICD-10-CM | POA: Diagnosis not present

## 2022-03-11 DIAGNOSIS — R319 Hematuria, unspecified: Secondary | ICD-10-CM | POA: Diagnosis not present

## 2022-03-11 DIAGNOSIS — Z961 Presence of intraocular lens: Secondary | ICD-10-CM | POA: Diagnosis not present

## 2022-03-12 ENCOUNTER — Encounter: Payer: Self-pay | Admitting: Podiatry

## 2022-03-13 ENCOUNTER — Other Ambulatory Visit: Payer: Self-pay

## 2022-03-13 LAB — URINE CULTURE
MICRO NUMBER:: 14627082
SPECIMEN QUALITY:: ADEQUATE

## 2022-03-13 MED ORDER — CIPROFLOXACIN HCL 500 MG PO TABS: 500.0000 mg | ORAL_TABLET | Freq: Two times a day (BID) | ORAL | 0 refills | Status: AC

## 2022-03-13 MED ORDER — SACCHAROMYCES BOULARDII 250 MG PO CAPS: 250.0000 mg | ORAL_CAPSULE | Freq: Two times a day (BID) | ORAL | 0 refills | Status: DC

## 2022-03-20 ENCOUNTER — Other Ambulatory Visit: Payer: Self-pay

## 2022-03-20 DIAGNOSIS — C49A0 Gastrointestinal stromal tumor, unspecified site: Secondary | ICD-10-CM

## 2022-03-23 ENCOUNTER — Encounter: Payer: Self-pay | Admitting: Hematology

## 2022-03-23 ENCOUNTER — Other Ambulatory Visit: Payer: Self-pay | Admitting: Hematology

## 2022-03-23 ENCOUNTER — Other Ambulatory Visit: Payer: Self-pay | Admitting: Family

## 2022-03-23 ENCOUNTER — Inpatient Hospital Stay (HOSPITAL_BASED_OUTPATIENT_CLINIC_OR_DEPARTMENT_OTHER): Payer: Medicare Other | Admitting: Hematology

## 2022-03-23 ENCOUNTER — Inpatient Hospital Stay: Payer: Medicare Other | Attending: Hematology

## 2022-03-23 ENCOUNTER — Other Ambulatory Visit: Payer: Self-pay | Admitting: Gastroenterology

## 2022-03-23 ENCOUNTER — Other Ambulatory Visit: Payer: Self-pay

## 2022-03-23 VITALS — BP 150/75 | HR 72 | Temp 97.6°F | Resp 18 | Wt 186.9 lb

## 2022-03-23 DIAGNOSIS — C49A0 Gastrointestinal stromal tumor, unspecified site: Secondary | ICD-10-CM

## 2022-03-23 DIAGNOSIS — C49A2 Gastrointestinal stromal tumor of stomach: Secondary | ICD-10-CM | POA: Insufficient documentation

## 2022-03-23 DIAGNOSIS — Z79899 Other long term (current) drug therapy: Secondary | ICD-10-CM | POA: Diagnosis not present

## 2022-03-23 DIAGNOSIS — E1149 Type 2 diabetes mellitus with other diabetic neurological complication: Secondary | ICD-10-CM

## 2022-03-23 DIAGNOSIS — I10 Essential (primary) hypertension: Secondary | ICD-10-CM

## 2022-03-23 LAB — CBC WITH DIFFERENTIAL (CANCER CENTER ONLY)
Abs Immature Granulocytes: 0.01 10*3/uL (ref 0.00–0.07)
Basophils Absolute: 0 10*3/uL (ref 0.0–0.1)
Basophils Relative: 0 %
Eosinophils Absolute: 0.1 10*3/uL (ref 0.0–0.5)
Eosinophils Relative: 2 %
HCT: 31.4 % — ABNORMAL LOW (ref 36.0–46.0)
Hemoglobin: 10.3 g/dL — ABNORMAL LOW (ref 12.0–15.0)
Immature Granulocytes: 0 %
Lymphocytes Relative: 33 %
Lymphs Abs: 2.2 10*3/uL (ref 0.7–4.0)
MCH: 31 pg (ref 26.0–34.0)
MCHC: 32.8 g/dL (ref 30.0–36.0)
MCV: 94.6 fL (ref 80.0–100.0)
Monocytes Absolute: 0.5 10*3/uL (ref 0.1–1.0)
Monocytes Relative: 8 %
Neutro Abs: 3.9 10*3/uL (ref 1.7–7.7)
Neutrophils Relative %: 57 %
Platelet Count: 260 10*3/uL (ref 150–400)
RBC: 3.32 MIL/uL — ABNORMAL LOW (ref 3.87–5.11)
RDW: 13.4 % (ref 11.5–15.5)
WBC Count: 6.8 10*3/uL (ref 4.0–10.5)
nRBC: 0 % (ref 0.0–0.2)

## 2022-03-23 LAB — CMP (CANCER CENTER ONLY)
ALT: 13 U/L (ref 0–44)
AST: 20 U/L (ref 15–41)
Albumin: 4.2 g/dL (ref 3.5–5.0)
Alkaline Phosphatase: 62 U/L (ref 38–126)
Anion gap: 4 — ABNORMAL LOW (ref 5–15)
BUN: 13 mg/dL (ref 8–23)
CO2: 27 mmol/L (ref 22–32)
Calcium: 9 mg/dL (ref 8.9–10.3)
Chloride: 105 mmol/L (ref 98–111)
Creatinine: 1.06 mg/dL — ABNORMAL HIGH (ref 0.44–1.00)
GFR, Estimated: 54 mL/min — ABNORMAL LOW (ref >60)
Glucose, Bld: 98 mg/dL (ref 70–99)
Potassium: 3.8 mmol/L (ref 3.5–5.1)
Sodium: 136 mmol/L (ref 135–145)
Total Bilirubin: 0.5 mg/dL (ref 0.3–1.2)
Total Protein: 7.2 g/dL (ref 6.5–8.1)

## 2022-03-23 LAB — IRON AND IRON BINDING CAPACITY (CC-WL,HP ONLY)
Iron: 47 ug/dL (ref 28–170)
Saturation Ratios: 16 % (ref 10.4–31.8)
TIBC: 295 ug/dL (ref 250–450)
UIBC: 248 ug/dL (ref 148–442)

## 2022-03-23 LAB — FERRITIN: Ferritin: 130 ng/mL (ref 11–307)

## 2022-03-23 MED ORDER — IMATINIB MESYLATE 100 MG PO TABS: 300.0000 mg | ORAL_TABLET | Freq: Every day | ORAL | 2 refills | Status: DC

## 2022-03-23 NOTE — Progress Notes (Signed)
[place]   [phone] [phone]   Clinic Follow up Note   Patient Care Team: Ngetich, Nelda Bucks, NP as PCP - General (Family Medicine) Shirley Muscat, Loreen Freud, MD as Referring Physician (Optometry) Latanya Maudlin, MD as Consulting Physician (Orthopedic Surgery) Warden Fillers, MD as Consulting Physician (Ophthalmology) Ladene Artist, MD as Consulting Physician (Gastroenterology) Abbie Sons, MD (Urology) Stechschulte, Nickola Major, MD as Consulting Physician (Surgery) Truitt Merle, MD as Consulting Physician (Hematology)  Date of Service:  03/23/2022  CHIEF COMPLAINT: f/u of  GIST   CURRENT THERAPY:  [place], started 03/09/21, currently '300mg'$    ASSESSMENT:  Alison [name] is a 77 y.o. 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 S/p partial gastrectomy 01/14/21 by Dr. 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 No lymph nodes were evaluated.  -she has high risk of recurrence (86%) due to the large size and high grade.  -staging chest CT 02/17/21 negative for metastatic disease. 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 She is tolerating well so far with mild to moderate diarrhea, improved with Imodium. She developed worsening epigastric pain, and Gleevec was held for a month. -she restarted Gleevec at 100 mg on 05/22/21 and gradually increased to 300 mg daily in June, she is tolerating well so far  -restaging CT CAP from 12/31/2021 showed no evidence of recurrence.  Previous nodular area adjacent to the suture margin has resolved. -She is clinically doing well, tolerating imatinib well, no clinical concern for recurrence. -Plan to repeat a neck scan in 3 months.      PLAN: - need refill of Gleevec 300 mg  daily - reviewed labs - CT Scan end of June - f/u, labs first week of July   INTERVAL HISTORY:  Alison [name] is here for a follow up of GIST  She was last seen by me on 01/01/2022 She presents to the clinic accompanied by family member. Patient reports a few days of having blood in her urine. She went to the ER with no issues found. She went to PCP and they found ecoli in her urine, they gave her antibiotic. Scan done and no issues were found.    All other systems were reviewed with the patient and are negative.  MEDICAL HISTORY:  Past Medical History:  Diagnosis Date   Anxiety    Benign essential hypertension    Blood transfusion without reported diagnosis    Cataract    removed bilateraly   Diabetes mellitus without complication ([place])    Diverticulitis of colon (without mention of hemorrhage)(562.11)    GERD (gastroesophageal reflux disease)    Goiter, specified as simple    Helicobacter pylori gastritis    Hyperlipidemia LDL goal < 100    Intrahepatic bile duct dilation    Numbness    TOES RT FOOT and toes left foot   Obesity, unspecified    Osteoarthrosis, unspecified whether generalized or localized, lower leg    Osteopenia    Other specified disease of nail    FUNGUS OF FINGERNAILS   Pernicious anemia    Unspecified glaucoma(365.9)    Unspecified vitamin D deficiency    Wears dentures    Wears glasses     SURGICAL HISTORY: Past Surgical History:  Procedure Laterality Date   BACK  SURGERY     CATARACT EXTRACTION     right eye 06/20/15 left eye 09/12/15   COLECTOMY  2009   COLONOSCOPY  09/06/2007   Dr Carol Ada   DILATION AND CURETTAGE OF UTERUS     ENDOSCOPIC RETROGRADE CHOLANGIOPANCREATOGRAPHY (ERCP) WITH PROPOFOL N/A 03/17/2019   Procedure: ENDOSCOPIC RETROGRADE CHOLANGIOPANCREATOGRAPHY (ERCP) WITH PROPOFOL;  Surgeon: Ladene Artist, MD;  Location: [facility] ENDOSCOPY;  Service: Endoscopy;  Laterality: N/A;   JOINT REPLACEMENT     right   LUMBAR  LAMINECTOMY/DECOMPRESSION MICRODISCECTOMY N/A 03/30/2013   Procedure: CENTRAL DECOMPRESSION L4 - L5 AND EXCISION 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;  Surgeon: Tobi Bastos, MD;  Location: [facility];  Service: Orthopedics;  Laterality: N/A;   MULTIPLE TOOTH EXTRACTIONS     REMOVAL OF STONES  03/17/2019   Procedure: REMOVAL OF STONES;  Surgeon: Ladene Artist, MD;  Location: [place];  Service: Endoscopy;;   SIGMOIDOSCOPY  02/16/2018   Dr Nira Conn  03/17/2019   Procedure: SPHINCTEROTOMY;  Surgeon: Ladene Artist, MD;  Location: [place];  Service: Endoscopy;;   TOTAL KNEE ARTHROPLASTY Right 02/01/2013   Procedure: RIGHT TOTAL KNEE ARTHROPLASTY;  Surgeon: Tobi Bastos, MD;  Location: [facility];  Service: Orthopedics;  Laterality: Right;   TOTAL KNEE ARTHROPLASTY Left 04/18/2014   Procedure: LEFT TOTAL KNEE ARTHROPLASTY;  Surgeon: Latanya Maudlin, MD;  Location: [facility];  Service: Orthopedics;  Laterality: Left;    I have reviewed the social history and family history with the patient and they are unchanged from previous note.  ALLERGIES:  is allergic to aspirin.  MEDICATIONS:  Current Outpatient Medications  Medication Sig Dispense Refill   acetaminophen (TYLENOL) 650 MG CR tablet Take 1,300 mg by mouth as needed.     amLODipine (NORVASC) 10 MG tablet Take 1 tablet by mouth once daily 90 tablet 0   aspirin EC 81 MG tablet Take 81 mg by mouth daily. (Patient not taking: Reported on 03/10/2022)     atorvastatin (LIPITOR) 40 MG tablet TAKE 1 TABLET BY MOUTH ONCE DAILY AT 6 PM 90 tablet 1   Calcium Carb-Cholecalciferol (CALCIUM 600 + D PO) Take 1 tablet by mouth in the morning.     carboxymethylcellulose (REFRESH PLUS) 0.5 % SOLN Place 1 drop into both eyes 3 (three) times daily as needed (dry eyes).      Cholecalciferol (VITAMIN D-3) 1000 UNITS CAPS Take 1,000 Units by mouth in the morning.     dorzolamide-timolol (COSOPT) 22.3-6.8 MG/ML ophthalmic solution Place 1 drop into both  eyes 2 (two) times daily.     gabapentin (NEURONTIN) 300 MG capsule Take 1 capsule by mouth at bedtime 90 capsule 0   imatinib (GLEEVEC) 100 MG tablet Take 3 tablets (300 mg total) by mouth daily. Take with meals and large glass of water.Caution:Chemotherapy 90 tablet 2   latanoprost (XALATAN) 0.005 % ophthalmic solution Place 1 drop into both eyes at bedtime.     Lidocaine HCl 4 % CREA Apply 1 application topically 2 (two) times daily as needed (knee pain.).     lisinopril (ZESTRIL) 10 MG tablet Take 1 tablet (10 mg total) by mouth daily. 90 tablet 1   lisinopril (ZESTRIL) 30 MG tablet Take 1 tablet by mouth once daily 90 tablet 0   Multiple Vitamin (MULTIVITAMIN WITH MINERALS) TABS tablet Take 1 tablet by mouth daily.     nystatin ointment (MYCOSTATIN) Apply 1 Application topically 2 (two) times daily. (Patient not taking: Reported on  03/10/2022) 30 g 1   Omega-3 Fatty Acids (OMEGA 3 PO) Take 1,200 mg by mouth in the morning.     ondansetron (ZOFRAN) 8 MG tablet Take 1 tablet (8 mg total) by mouth every 8 (eight) hours as needed for nausea or vomiting. 20 tablet 2   pantoprazole (PROTONIX) 40 MG tablet Take 1 tablet by mouth once daily 90 tablet 0   saccharomyces boulardii (FLORASTOR) 250 MG capsule Take 1 capsule (250 mg total) by mouth 2 (two) times daily. 14 capsule 0   Zinc Oxide (DESITIN EX) Apply 1 application topically as needed.     No current facility-administered medications for this visit.    PHYSICAL EXAMINATION: ECOG PERFORMANCE STATUS: 0 - Asymptomatic  Vitals:   03/23/22 1426  BP: (!) 150/75  Pulse: 72  Resp: 18  Temp: 97.6 F (36.4 C)  SpO2: 100%   Wt Readings from Last 3 Encounters:  03/23/22 186 lb 14.4 oz (84.8 kg)  03/10/22 186 lb 9.6 oz (84.6 kg)  01/13/22 185 lb (83.9 kg)    GENERAL:(-)alert, no distress and comfortable SKIN: skin color, texture, turgor are normal, no rashes or significant lesions EYES: normal, Conjunctiva are pink and non-injected, sclera  clear  NECK:(-) supple, thyroid normal size, non-tender, without nodularity LYMPH: (-) no palpable lymphadenopathy in the cervical, axillary  LUNGS: (-) clear to auscultation and percussion with normal breathing effort HEART:(-)  regular rate & rhythm and no murmurs and no lower extremity edema ABDOMEN:(-) abdomen soft, non-tender and normal bowel sounds Musculoskeletal:no cyanosis of digits and no clubbing  NEURO: alert & oriented x 3 with fluent speech, no focal motor/sensory deficits  LABORATORY DATA:  I have reviewed the data as listed    Latest Ref Rng & Units 03/23/2022    1:49 PM 01/14/2022   11:27 AM 01/01/2022    1:31 PM  CBC  WBC 4.0 - 10.5 K/uL 6.8  7.1  7.5   Hemoglobin 12.0 - 15.0 g/dL 10.3  11.5  10.3   Hematocrit 36.0 - 46.0 % 31.4  33.8  31.1   Platelets 150 - 400 K/uL 260  298  244         Latest Ref Rng & Units 03/23/2022    1:49 PM 01/14/2022   11:27 AM 01/01/2022    1:31 PM  CMP  Glucose 70 - 99 mg/dL 98  138  120   BUN 8 - 23 mg/dL '13  9  13   '$ Creatinine 0.44 - 1.00 mg/dL 1.06  0.80  1.06   Sodium 135 - 145 mmol/L 136  137  138   Potassium 3.5 - 5.1 mmol/L 3.8  3.3  3.8   Chloride 98 - 111 mmol/L 105  108  106   CO2 22 - 32 mmol/L '27  23  28   '$ Calcium 8.9 - 10.3 mg/dL 9.0  8.8  9.3   Total Protein 6.5 - 8.1 g/dL 7.2   7.4   Total Bilirubin 0.3 - 1.2 mg/dL 0.5   0.5   Alkaline Phos 38 - 126 U/L 62   61   AST 15 - 41 U/L 20   21   ALT 0 - 44 U/L 13   12       RADIOGRAPHIC STUDIES: I have personally reviewed the radiological images as listed and agreed with the findings in the report. No results found.    Orders Placed This Encounter  Procedures   CT CHEST ABDOMEN PELVIS W CONTRAST  Standing Status:   Future    Standing Expiration Date:   03/23/2023    Order Specific Question:   Preferred imaging location?    Answer:   [facility]    Order Specific Question:   Is Oral Contrast requested for this exam?    Answer:   Yes, Per Radiology  protocol   All questions were answered. The patient knows to call the clinic with any problems, questions or concerns. No barriers to learning was detected. The total time spent in the appointment was 30 minutes.     Truitt Merle, MD 03/23/2022   I, Maurine Simmering, CMA, am acting as scribe for Truitt Merle, MD.   I have reviewed the above documentation for accuracy and completeness, and I agree with the above.

## 2022-03-23 NOTE — Assessment & Plan Note (Signed)
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 S/p partial gastrectomy 01/14/21 by Dr. 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 No lymph nodes were evaluated.  -she has high risk of recurrence (86%) due to the large size and high grade.  -staging chest CT 02/17/21 negative for metastatic disease. 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 She is tolerating well so far with mild to moderate diarrhea, improved with Imodium. She developed worsening epigastric pain, and Gleevec was held for a month. -she restarted Gleevec at 100 mg on 05/22/21 and gradually increased to 300 mg daily in June, she is tolerating well so far  -restaging CT CAP from 12/31/2021 showed no evidence of recurrence.  Previous nodular area adjacent to the suture margin has resolved. 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 -She is clinically doing well, tolerating imatinib well, no clinical concern for recurrence. -Plan to repeat a neck scan in 3 months.

## 2022-03-24 ENCOUNTER — Telehealth: Payer: Self-pay | Admitting: Nurse Practitioner

## 2022-03-24 ENCOUNTER — Other Ambulatory Visit: Payer: Self-pay

## 2022-03-24 MED ORDER — PANTOPRAZOLE SODIUM 40 MG PO TBEC: 40.0000 mg | DELAYED_RELEASE_TABLET | Freq: Every day | ORAL | 0 refills | Status: DC

## 2022-03-24 NOTE — Telephone Encounter (Signed)
Inbound call from pt she need a refill for Pantoprazole she is schedule to see Dr.Stark  4/18 can you please refill it until appt .Thanks

## 2022-03-24 NOTE — Telephone Encounter (Signed)
Called patient. Confirmed her pharmacy as [facility] on [facility] [address]. Refill for pantoprazole #90 transmitted.

## 2022-03-27 ENCOUNTER — Other Ambulatory Visit: Payer: Medicare Other

## 2022-03-30 ENCOUNTER — Other Ambulatory Visit: Payer: Medicare Other

## 2022-03-30 DIAGNOSIS — E1149 Type 2 diabetes mellitus with other diabetic neurological complication: Secondary | ICD-10-CM

## 2022-03-30 DIAGNOSIS — N1831 Chronic kidney disease, stage 3a: Secondary | ICD-10-CM | POA: Diagnosis not present

## 2022-03-30 DIAGNOSIS — E1169 Type 2 diabetes mellitus with other specified complication: Secondary | ICD-10-CM

## 2022-03-30 DIAGNOSIS — I10 Essential (primary) hypertension: Secondary | ICD-10-CM | POA: Diagnosis not present

## 2022-03-30 DIAGNOSIS — E785 Hyperlipidemia, unspecified: Secondary | ICD-10-CM | POA: Diagnosis not present

## 2022-03-31 LAB — CBC WITH DIFFERENTIAL/PLATELET
Absolute Monocytes: 400 cells/uL (ref 200–950)
Basophils Absolute: 32 cells/uL (ref 0–200)
Basophils Relative: 0.6 %
Eosinophils Absolute: 108 cells/uL (ref 15–500)
Eosinophils Relative: 2 %
HCT: 33.6 % — ABNORMAL LOW (ref 35.0–45.0)
Hemoglobin: 10.9 g/dL — ABNORMAL LOW (ref 11.7–15.5)
Lymphs Abs: 1447 cells/uL (ref 850–3900)
MCH: 30.8 pg (ref 27.0–33.0)
MCHC: 32.4 g/dL (ref 32.0–36.0)
MCV: 94.9 fL (ref 80.0–100.0)
MPV: 10.5 fL (ref 7.5–12.5)
Monocytes Relative: 7.4 %
Neutro Abs: 3413 cells/uL (ref 1500–7800)
Neutrophils Relative %: 63.2 %
Platelets: 302 10*3/uL (ref 140–400)
RBC: 3.54 10*6/uL — ABNORMAL LOW (ref 3.80–5.10)
RDW: 11.7 % (ref 11.0–15.0)
Total Lymphocyte: 26.8 %
WBC: 5.4 10*3/uL (ref 3.8–10.8)

## 2022-03-31 LAB — HEMOGLOBIN A1C
Hgb A1c MFr Bld: 5.5 % of total Hgb (ref <5.7)
Mean Plasma Glucose: 111 mg/dL
eAG (mmol/L): 6.2 mmol/L

## 2022-03-31 LAB — COMPLETE METABOLIC PANEL WITH GFR
AG Ratio: 1.5 (calc) (ref 1.0–2.5)
ALT: 12 U/L (ref 6–29)
AST: 18 U/L (ref 10–35)
Albumin: 4.1 g/dL (ref 3.6–5.1)
Alkaline phosphatase (APISO): 56 U/L (ref 37–153)
BUN/Creatinine Ratio: 12 (calc) (ref 6–22)
BUN: 13 mg/dL (ref 7–25)
CO2: 25 mmol/L (ref 20–32)
Calcium: 8.8 mg/dL (ref 8.6–10.4)
Chloride: 108 mmol/L (ref 98–110)
Creat: 1.07 mg/dL — ABNORMAL HIGH (ref 0.60–1.00)
Globulin: 2.7 g/dL (calc) (ref 1.9–3.7)
Glucose, Bld: 95 mg/dL (ref 65–99)
Potassium: 4.2 mmol/L (ref 3.5–5.3)
Sodium: 140 mmol/L (ref 135–146)
Total Bilirubin: 0.5 mg/dL (ref 0.2–1.2)
Total Protein: 6.8 g/dL (ref 6.1–8.1)
eGFR: 53 mL/min/{1.73_m2} — ABNORMAL LOW (ref >=60)

## 2022-03-31 LAB — LIPID PANEL
Cholesterol: 94 mg/dL (ref <200)
HDL: 39 mg/dL — ABNORMAL LOW (ref >=50)
LDL Cholesterol (Calc): 42 mg/dL (calc)
Non-HDL Cholesterol (Calc): 55 mg/dL (calc) (ref <130)
Total CHOL/HDL Ratio: 2.4 (calc) (ref <5.0)
Triglycerides: 54 mg/dL (ref <150)

## 2022-03-31 LAB — TSH: TSH: 0.62 mIU/L (ref 0.40–4.50)

## 2022-04-01 ENCOUNTER — Ambulatory Visit (INDEPENDENT_AMBULATORY_CARE_PROVIDER_SITE_OTHER): Payer: Medicare Other | Admitting: Family

## 2022-04-01 ENCOUNTER — Encounter: Payer: Self-pay | Admitting: Family

## 2022-04-01 VITALS — BP 124/80 | HR 74 | Temp 97.3°F | Ht 67.0 in | Wt 184.0 lb

## 2022-04-01 DIAGNOSIS — N1831 Chronic kidney disease, stage 3a: Secondary | ICD-10-CM | POA: Diagnosis not present

## 2022-04-01 DIAGNOSIS — E1169 Type 2 diabetes mellitus with other specified complication: Secondary | ICD-10-CM | POA: Diagnosis not present

## 2022-04-01 DIAGNOSIS — E785 Hyperlipidemia, unspecified: Secondary | ICD-10-CM | POA: Diagnosis not present

## 2022-04-01 DIAGNOSIS — B3731 Acute candidiasis of vulva and vagina: Secondary | ICD-10-CM | POA: Diagnosis not present

## 2022-04-01 DIAGNOSIS — I10 Essential (primary) hypertension: Secondary | ICD-10-CM | POA: Diagnosis not present

## 2022-04-01 DIAGNOSIS — D509 Iron deficiency anemia, unspecified: Secondary | ICD-10-CM

## 2022-04-01 MED ORDER — NYSTATIN 100000 UNIT/GM EX CREA: 1.0000 | TOPICAL_CREAM | Freq: Two times a day (BID) | CUTANEOUS | 3 refills | Status: DC

## 2022-04-01 MED ORDER — IRON (FERROUS SULFATE) 325 (65 FE) MG PO TABS: 325.0000 mg | ORAL_TABLET | Freq: Every day | ORAL | 3 refills | Status: DC

## 2022-04-01 NOTE — Progress Notes (Signed)
Provider: Marlowe Sax FNP-C   Alison [name], Alison Bucks, NP  Patient Care Team: Alison [name], Alison Bucks, NP as PCP - General (Family Medicine) Alison [name] Alison Freud, MD as Referring Physician (Optometry) Alison Maudlin, MD as Consulting Physician (Orthopedic Surgery) Alison Fillers, MD as Consulting Physician (Ophthalmology) Alison Artist, MD as Consulting Physician (Gastroenterology) Alison Sons, MD (Urology) [name], Alison Major, MD as Consulting Physician (Surgery) Alison Merle, MD as Consulting Physician (Hematology)  Extended Emergency Contact Information Primary Emergency Contact: [name],Alison Home Phone: [phone] Mobile Phone: [phone] Relation: Daughter Secondary Emergency Contact: [name], Alison [address] Alison [name] of [place] Phone: [phone] Mobile Phone: [phone] Relation: Sister  Code Status:  Full Code  Goals of care: Advanced Directive information    04/01/2022   12:49 PM  Advanced Directives  Does Patient Have a Medical Advance Directive? No  Would patient like information on creating a medical advance directive? No - Patient declined     Chief Complaint  Patient presents with   Medical Management of Chronic Issues    6 month follow-up and discuss recent labs. Foot exam today. Discuss need for td/tdap, covid boosters, and eye exam or ost pone or refuse if patient is not a candidate. Discuss if patient can resume ASA. Requesting refill on nystatin ointment     HPI:  Pt is a 77 y.o. female seen today for 6 months follow up for medical management of chronic diseases. Recent lab results reviewed and discussed with patient during visit. Hgb 10.9 previous 10.3 States stays cold all the time.  Continue to follow-up with Dr. Truitt [name], oncologist for gastrointestinal stromal tumor  CR 1.07 previous 1.06  HDL 39   Past Medical History:  Diagnosis Date   Anxiety    Benign essential hypertension    Blood transfusion  without reported diagnosis    Cataract    removed bilateraly   Diabetes mellitus without complication ([place])    Diverticulitis of colon (without mention of hemorrhage)(562.11)    GERD (gastroesophageal reflux disease)    Goiter, specified as simple    Helicobacter pylori gastritis    Hyperlipidemia LDL goal < 100    Intrahepatic bile duct dilation    Numbness    TOES RT FOOT and toes left foot   Obesity, unspecified    Osteoarthrosis, unspecified whether generalized or localized, lower leg    Osteopenia    Other specified disease of nail    FUNGUS OF FINGERNAILS   Pernicious anemia    Unspecified glaucoma(365.9)    Unspecified vitamin D deficiency    Wears dentures    Wears glasses    Past Surgical History:  Procedure Laterality Date   BACK SURGERY     CATARACT EXTRACTION     right eye 06/20/15 left eye 09/12/15   COLECTOMY  2009   COLONOSCOPY  09/06/2007   Dr Alison [name]   DILATION AND CURETTAGE OF UTERUS     ENDOSCOPIC RETROGRADE CHOLANGIOPANCREATOGRAPHY (ERCP) WITH PROPOFOL N/A 03/17/2019   Procedure: ENDOSCOPIC RETROGRADE CHOLANGIOPANCREATOGRAPHY (ERCP) WITH PROPOFOL;  Surgeon: Alison Artist, MD;  Location: [facility] ENDOSCOPY;  Service: Endoscopy;  Laterality: N/A;   JOINT REPLACEMENT     right   LUMBAR LAMINECTOMY/DECOMPRESSION MICRODISCECTOMY N/A 03/30/2013   Procedure: CENTRAL DECOMPRESSION L4 - L5 AND EXCISION 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;  Surgeon: Alison Bastos, MD;  Location: [facility] ORS;  Service: Orthopedics;  Laterality: N/A;   MULTIPLE TOOTH EXTRACTIONS  REMOVAL OF STONES  03/17/2019   Procedure: REMOVAL OF STONES;  Surgeon: Alison Artist, MD;  Location: [place];  Service: Endoscopy;;   SIGMOIDOSCOPY  02/16/2018   Dr Alison [name]  03/17/2019   Procedure: SPHINCTEROTOMY;  Surgeon: Alison Artist, MD;  Location: [place];  Service: Endoscopy;;   TOTAL KNEE ARTHROPLASTY Right 02/01/2013   Procedure: RIGHT TOTAL KNEE ARTHROPLASTY;  Surgeon: Alison Bastos, MD;  Location: [facility];  Service: Orthopedics;  Laterality: Right;   TOTAL KNEE ARTHROPLASTY Left 04/18/2014   Procedure: LEFT TOTAL KNEE ARTHROPLASTY;  Surgeon: Alison Maudlin, MD;  Location: [facility];  Service: Orthopedics;  Laterality: Left;    Allergies  Allergen Reactions   Aspirin Nausea Only and Nausea And Vomiting    Can take 81 mg but can't take 325mg , hard on stomach. Can take 81 mg but can't take 325mg , hard on stomach.    Allergies as of 04/01/2022       Reactions   Aspirin Nausea Only, Nausea And Vomiting   Can take 81 mg but can't take 325mg , hard on stomach. Can take 81 mg but can't take 325mg , hard on stomach.        Medication List        Accurate as of April 01, 2022  1:03 PM. If you have any questions, ask your nurse or doctor.          STOP taking these medications    saccharomyces boulardii 250 MG capsule Commonly known as: Publishing copy Stopped by: Alison Hughs, NP       TAKE these medications    acetaminophen 650 MG CR tablet Commonly known as: TYLENOL Take 1,300 mg by mouth as needed.   amLODipine 10 MG tablet Commonly known as: NORVASC Take 1 tablet by mouth once daily   aspirin EC 81 MG tablet Take 81 mg by mouth daily.   atorvastatin 40 MG tablet Commonly known as: LIPITOR TAKE 1 TABLET BY MOUTH ONCE DAILY AT 6 PM   CALCIUM 600 + D PO Take 1 tablet by mouth in the morning.   carboxymethylcellulose 0.5 % Soln Commonly known as: REFRESH PLUS Place 1 drop into both eyes 3 (three) times daily as needed (dry eyes).   DESITIN EX Apply 1 application topically as needed.   dorzolamide-timolol 2-0.5 % ophthalmic solution Commonly known as: COSOPT Place 1 drop into both eyes 2 (two) times daily.   gabapentin 300 MG capsule Commonly known as: NEURONTIN Take 1 capsule by mouth at bedtime   imatinib 100 MG tablet Commonly known as: GLEEVEC Take 3 tablets (300 mg total) by mouth daily. Take with meals and large glass of  water.Caution:Chemotherapy   latanoprost 0.005 % ophthalmic solution Commonly known as: XALATAN Place 1 drop into both eyes at bedtime.   Lidocaine HCl 4 % Crea Apply 1 application topically 2 (two) times daily as needed (knee pain.).   lisinopril 10 MG tablet Commonly known as: ZESTRIL Take 1 tablet (10 mg total) by mouth daily.   loperamide 2 MG tablet Commonly known as: IMODIUM A-D Take 2 mg by mouth 2 (two) times daily.   multivitamin with minerals Tabs tablet Take 1 tablet by mouth daily.   nystatin ointment Commonly known as: MYCOSTATIN Apply 1 Application topically 2 (two) times daily.   OMEGA 3 PO Take 1,200 mg by mouth in the morning.   ondansetron 8 MG tablet Commonly known as: ZOFRAN Take 1 tablet (8 mg total) by mouth every  8 (eight) hours as needed for nausea or vomiting.   pantoprazole 40 MG tablet Commonly known as: PROTONIX Take 1 tablet (40 mg total) by mouth daily.   Vitamin D-3 25 MCG (1000 UT) Caps Take 1,000 Units by mouth in the morning.        Review of Systems  Constitutional:  Negative for appetite change, chills, fatigue, fever and unexpected weight change.  HENT:  Negative for congestion, dental problem, ear discharge, ear pain, facial swelling, hearing loss, nosebleeds, postnasal drip, rhinorrhea, sinus pressure, sinus pain, sneezing, sore throat, tinnitus and trouble swallowing.   Eyes:  Negative for pain, discharge, redness, itching and visual disturbance.  Respiratory:  Negative for cough, chest tightness, shortness of breath and wheezing.   Cardiovascular:  Negative for chest pain, palpitations and leg swelling.  Gastrointestinal:  Negative for abdominal distention, abdominal pain, blood in stool, constipation, diarrhea, nausea and vomiting.  Endocrine: Negative for cold intolerance, heat intolerance, polydipsia, polyphagia and polyuria.  Genitourinary:  Negative for difficulty urinating, dysuria, flank pain, frequency and urgency.   Musculoskeletal:  Positive for gait problem. Negative for arthralgias, back pain, joint swelling, myalgias, neck pain and neck stiffness.  Skin:  Negative for color change, pallor, rash and wound.  Neurological:  Negative for dizziness, syncope, speech difficulty, weakness, light-headedness, numbness and headaches.  Hematological:  Does not bruise/bleed easily.    Immunization History  Administered Date(s) Administered   Fluad Quad(high Dose 65+) 09/05/2018, 09/26/2020, 10/01/2021, 10/02/2021   Influenza, High Dose Seasonal PF 10/02/2017, 11/20/2019   Influenza,inj,Quad PF,6+ Mos 10/03/2012, 09/21/2014, 11/07/2015   Influenza,inj,quad, With Preservative 11/12/2016   Influenza-Unspecified 10/28/2013   PFIZER(Purple Top)SARS-COV-2 Vaccination 03/25/2019, 03/25/2019, 04/18/2019, 11/01/2019, 05/23/2020, 10/07/2020   Pneumococcal Conjugate-13 01/04/2014, 01/04/2014, 11/04/2016   Pneumococcal Polysaccharide-23 12/29/2012, 11/19/2017   Zoster Recombinat (Shingrix) 11/25/2016, 01/26/2017   Pertinent  Health Maintenance Due  Topic Date Due   OPHTHALMOLOGY EXAM  09/23/2021   FOOT EXAM  02/19/2022   HEMOGLOBIN A1C  09/30/2022   COLONOSCOPY (Pts 45-90yrs Insurance coverage will need to be confirmed)  02/17/2023   INFLUENZA VACCINE  Completed   DEXA SCAN  Completed      01/01/2022    2:00 PM 01/13/2022    3:03 PM 01/14/2022   11:46 AM 03/10/2022    1:28 PM 04/01/2022   12:51 PM  Fall Risk  Falls in the past year?    0 0  Was there an injury with Fall?    0 0  Fall Risk Category Calculator    0 0  (RETIRED) Patient Fall Risk Level Low fall risk Low fall risk Low fall risk    Patient at Risk for Falls Due to    No Fall Risks No Fall Risks  Fall risk Follow up    Falls evaluation completed Falls evaluation completed   Functional Status Survey:    Vitals:   04/01/22 1246  BP: 124/80  Pulse: 74  Temp: (!) 97.3 F (36.3 C)  TempSrc: Temporal  SpO2: 99%  Weight: 184 lb (83.5 kg)   Height: 5\' 7"  (1.702 m)   Body mass index is 28.82 kg/m. Physical Exam Vitals reviewed.  Constitutional:      General: She is not in acute distress.    Appearance: Normal appearance. She is overweight. She is not ill-appearing or diaphoretic.  HENT:     Head: Normocephalic.     Mouth/Throat:     Mouth: Mucous membranes are moist.     Pharynx: Oropharynx is clear. No oropharyngeal  exudate or posterior oropharyngeal erythema.  Eyes:     General: No scleral icterus.       Right eye: No discharge.        Left eye: No discharge.     Conjunctiva/sclera: Conjunctivae normal.  Neck:     Vascular: No carotid bruit.  Cardiovascular:     Rate and Rhythm: Normal rate and regular rhythm.     Pulses: Normal pulses.     Heart sounds: Normal heart sounds. No murmur heard.    No friction rub. No gallop.  Pulmonary:     Effort: Pulmonary effort is normal. No respiratory distress.     Breath sounds: Normal breath sounds. No wheezing, rhonchi or rales.  Chest:     Chest wall: No tenderness.  Abdominal:     General: Bowel sounds are normal. There is no distension.     Palpations: Abdomen is soft. There is no mass.     Tenderness: There is no abdominal tenderness. There is no right CVA tenderness, left CVA tenderness, guarding or rebound.  Musculoskeletal:        General: No swelling or tenderness. Normal range of motion.     Cervical back: Normal range of motion. No rigidity or tenderness.     Right lower leg: No edema.     Left lower leg: No edema.  Lymphadenopathy:     Cervical: No cervical adenopathy.  Skin:    General: Skin is warm and dry.     Coloration: Skin is not pale.     Findings: No bruising, erythema, lesion or rash.  Neurological:     Mental Status: She is alert and oriented to person, place, and time.     Cranial Nerves: No cranial nerve deficit.     Sensory: No sensory deficit.     Motor: No weakness.     Coordination: Coordination normal.     Gait: Gait abnormal.   Psychiatric:        Mood and Affect: Mood normal.        Speech: Speech normal.        Behavior: Behavior normal.        Thought Content: Thought content normal.        Judgment: Judgment normal.     Labs reviewed: Recent Labs    01/14/22 1127 03/23/22 1349 03/30/22 0841  NA 137 136 140  K 3.3* 3.8 4.2  CL 108 105 108  CO2 23 27 25   GLUCOSE 138* 98 95  BUN 9 13 13   CREATININE 0.80 1.06* 1.07*  CALCIUM 8.8* 9.0 8.8   Recent Labs    10/02/21 1258 01/01/22 1331 03/23/22 1349 03/30/22 0841  AST 21 21 20 18   ALT 17 12 13 12   ALKPHOS 73 61 62  --   BILITOT 0.3 0.5 0.5 0.5  PROT 6.5 7.4 7.2 6.8  ALBUMIN 3.9 4.3 4.2  --    Recent Labs    01/14/22 1127 03/23/22 1349 03/30/22 0841  WBC 7.1 6.8 5.4  NEUTROABS 4.8 3.9 3,413  HGB 11.5* 10.3* 10.9*  HCT 33.8* 31.4* 33.6*  MCV 95.2 94.6 94.9  PLT 298 260 302   Lab Results  Component Value Date   TSH 0.62 03/30/2022   Lab Results  Component Value Date   HGBA1C 5.5 03/30/2022   Lab Results  Component Value Date   CHOL 94 03/30/2022   HDL 39 (L) 03/30/2022   LDLCALC 42 03/30/2022   TRIG 54 03/30/2022   CHOLHDL 2.4  03/30/2022    Significant Diagnostic Results in last 30 days:  No results found.  Assessment/Plan 1. Iron deficiency anemia, unspecified iron deficiency anemia type Hgb 10.9 slightly improved previous 10.3 Suspect on going GIST and CKD stage 3 could be a contributory factor for her low Hgb. Start on Ferrous sulfate as below. - Iron, Ferrous Sulfate, 325 (65 Fe) MG TABS; Take 325 mg by mouth daily.  Dispense: 30 tablet; Refill: 3  2. Stage 3a chronic kidney disease (HCC) CR stable at baseline  -Continue to avoid nephrotoxins and dose other medication for renal clearance  3. Hyperlipidemia associated with type 2 diabetes mellitus (HCC) LDL well-controlled -Continue on atorvastatin and omega-3 fatty acids -Dietary modification and exercise as tolerated Advised. - Lipid panel; Future  4.  Vaginal yeast infection Completed antibiotics for reason urinary tract infection but has been having some itching.  Will refill nystatin cream. - nystatin cream (MYCOSTATIN); Apply 1 Application topically 2 (two) times daily.  Dispense: 30 g; Refill: 3  5. Benign essential hypertension blood pressure well-controlled -Continue on lisinopril and amlodipine - TSH; Future  Family/ staff Communication: Reviewed plan of care with patient verbalized understanding   Labs/tests ordered:  - Lipid panel; Future - TSH; Future  Next Appointment :Return in about 6 months (around 10/02/2022) for medical mangement of chronic issues., Fasting labs in 6 months prior to visit.    Alison Hughs, NP

## 2022-04-01 NOTE — Patient Instructions (Signed)
1.) You can get your tetanus and covid boosters at your local pharmacy

## 2022-04-21 DIAGNOSIS — Z961 Presence of intraocular lens: Secondary | ICD-10-CM | POA: Diagnosis not present

## 2022-04-21 DIAGNOSIS — H26493 Other secondary cataract, bilateral: Secondary | ICD-10-CM | POA: Diagnosis not present

## 2022-04-21 DIAGNOSIS — H35033 Hypertensive retinopathy, bilateral: Secondary | ICD-10-CM | POA: Diagnosis not present

## 2022-04-21 DIAGNOSIS — H43813 Vitreous degeneration, bilateral: Secondary | ICD-10-CM | POA: Diagnosis not present

## 2022-04-21 DIAGNOSIS — H401131 Primary open-angle glaucoma, bilateral, mild stage: Secondary | ICD-10-CM | POA: Diagnosis not present

## 2022-04-21 DIAGNOSIS — E119 Type 2 diabetes mellitus without complications: Secondary | ICD-10-CM | POA: Diagnosis not present

## 2022-04-30 ENCOUNTER — Encounter: Payer: Self-pay | Admitting: Gastroenterology

## 2022-04-30 ENCOUNTER — Ambulatory Visit: Payer: Medicare Other | Admitting: Gastroenterology

## 2022-04-30 VITALS — BP 118/64 | HR 80 | Ht 65.0 in | Wt 183.2 lb

## 2022-04-30 DIAGNOSIS — Z860101 Personal history of adenomatous and serrated colon polyps: Secondary | ICD-10-CM

## 2022-04-30 DIAGNOSIS — K219 Gastro-esophageal reflux disease without esophagitis: Secondary | ICD-10-CM

## 2022-04-30 DIAGNOSIS — Z86010 Personal history of colonic polyps: Secondary | ICD-10-CM

## 2022-04-30 DIAGNOSIS — C49A0 Gastrointestinal stromal tumor, unspecified site: Secondary | ICD-10-CM

## 2022-04-30 MED ORDER — PANTOPRAZOLE SODIUM 40 MG PO TBEC: 40.0000 mg | DELAYED_RELEASE_TABLET | Freq: Every day | ORAL | 3 refills | Status: DC

## 2022-04-30 NOTE — Patient Instructions (Signed)
We have sent the following medications to your pharmacy for you to pick up at your convenience: pantoprazole.   Continue over the counter Imodium as needed for diarrhea.   The [facility] providers would like to encourage you to use [facility] to communicate with providers for non-urgent requests or questions.  Due to long hold times on the telephone, sending your provider a message by [facility] may be a faster and more efficient way to get a response.  Please allow 48 business hours for a response.  Please remember that this is for non-urgent requests.   Thank you for choosing me and [facility].  Alison [name]. Pleas Koch., MD., Clementeen Graham

## 2022-04-30 NOTE — Progress Notes (Signed)
    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 Mosetta Putt and her PCP as planned REV in 1 year   HPI    This is a 77 year old female returning for follow-up of GERD.  She is accompanied by her daughter.  Her reflux symptoms are very well-controlled on daily pantoprazole.  She notes frequent diarrhea that is felt to be related to Gleevec.  Her symptoms are controlled with 1-2 Imodium daily.    Labs / Imaging       Latest Ref Rng & Units 03/30/2022    8:41 AM 03/23/2022    1:49 PM 01/01/2022    1:31 PM  Hepatic Function  Total Protein 6.1 - 8.1 g/dL 6.8  7.2  7.4   Albumin 3.5 - 5.0 g/dL  4.2  4.3   AST 10 - 35 U/L 18  20  21    ALT 6 - 29 U/L 12  13  12    Alk Phosphatase 38 - 126 U/L  62  61   Total Bilirubin 0.2 - 1.2 mg/dL 0.5  0.5  0.5        Latest Ref Rng & Units 03/30/2022    8:41 AM 03/23/2022    1:49 PM 01/14/2022   11:27 AM  CBC  WBC 3.8 - 10.8 Thousand/uL 5.4  6.8  7.1   Hemoglobin 11.7 - 15.5 g/dL 01.7  51.0  25.8   Hematocrit 35.0 - 45.0 % 33.6  31.4  33.8   Platelets 140 - 400 Thousand/uL 302  260  298      No results found.  Current Medications, Allergies, Past Medical History, Past Surgical History, Family History and Social History were reviewed in [employer] record.   Physical Exam: General: Well developed, well nourished, no acute distress Head: Normocephalic and atraumatic Eyes: Sclerae anicteric, EOMI Ears: Normal auditory acuity Mouth: No deformities or lesions noted Lungs:  Clear throughout to auscultation Heart: Regular rate and rhythm; No murmurs, rubs or bruits Abdomen: Soft, non tender and non distended. No masses, hepatosplenomegaly or hernias noted. Normal Bowel sounds Rectal: Musculoskeletal: Symmetrical with no gross deformities  Pulses:  Normal pulses noted Extremities: No edema or deformities noted Neurological: Alert oriented x 4, grossly nonfocal Psychological:  Alert and cooperative. Normal mood and affect   Belladonna Lubinski T. Russella Dar, MD 04/30/2022, 1:45 PM

## 2022-05-13 DIAGNOSIS — H26493 Other secondary cataract, bilateral: Secondary | ICD-10-CM | POA: Diagnosis not present

## 2022-05-13 DIAGNOSIS — E119 Type 2 diabetes mellitus without complications: Secondary | ICD-10-CM | POA: Diagnosis not present

## 2022-05-13 DIAGNOSIS — Z961 Presence of intraocular lens: Secondary | ICD-10-CM | POA: Diagnosis not present

## 2022-05-13 DIAGNOSIS — H43813 Vitreous degeneration, bilateral: Secondary | ICD-10-CM | POA: Diagnosis not present

## 2022-05-13 DIAGNOSIS — H35033 Hypertensive retinopathy, bilateral: Secondary | ICD-10-CM | POA: Diagnosis not present

## 2022-05-13 DIAGNOSIS — H401131 Primary open-angle glaucoma, bilateral, mild stage: Secondary | ICD-10-CM | POA: Diagnosis not present

## 2022-05-13 LAB — HM DIABETES EYE EXAM

## 2022-05-21 ENCOUNTER — Telehealth: Payer: Self-pay

## 2022-05-21 NOTE — Telephone Encounter (Signed)
Patient called stating that she is having blood in her urine. She thinks it may be coming from the aspirin. She states that she last had blood in urine  04/26/22. She states that it only lasted a couple of hours. She stopped the aspirin and started back taking it 05/18/22 in which now she is having blood in urine again.. Between the time she stopped taking the aspirin to restarting it she hasn't had any bleeding. Patient states that she has also seen a urologist, who found nothing wrong. Please advise.  Message sent to Dinah Ngetich,NP

## 2022-05-21 NOTE — Telephone Encounter (Signed)
Please schedule appointment to evaluate and obtain urine specimen.Hold Asprin.

## 2022-05-22 NOTE — Telephone Encounter (Signed)
Called and left message for patient to call office to schedule appointment and to hold her aspirin.

## 2022-05-25 ENCOUNTER — Ambulatory Visit (INDEPENDENT_AMBULATORY_CARE_PROVIDER_SITE_OTHER): Payer: Medicare Other | Admitting: Family

## 2022-05-25 ENCOUNTER — Encounter: Payer: Self-pay | Admitting: Family

## 2022-05-25 VITALS — BP 132/80 | HR 75 | Temp 94.7°F | Ht 65.0 in | Wt 183.4 lb

## 2022-05-25 DIAGNOSIS — R319 Hematuria, unspecified: Secondary | ICD-10-CM | POA: Diagnosis not present

## 2022-05-25 LAB — CBC WITH DIFFERENTIAL/PLATELET
Absolute Monocytes: 511 cells/uL (ref 200–950)
Basophils Absolute: 28 cells/uL (ref 0–200)
Basophils Relative: 0.4 %
Eosinophils Absolute: 99 cells/uL (ref 15–500)
Eosinophils Relative: 1.4 %
HCT: 33.7 % — ABNORMAL LOW (ref 35.0–45.0)
Hemoglobin: 10.7 g/dL — ABNORMAL LOW (ref 11.7–15.5)
Lymphs Abs: 1803 cells/uL (ref 850–3900)
MCH: 30.5 pg (ref 27.0–33.0)
MCHC: 31.8 g/dL — ABNORMAL LOW (ref 32.0–36.0)
MCV: 96 fL (ref 80.0–100.0)
MPV: 10.5 fL (ref 7.5–12.5)
Monocytes Relative: 7.2 %
Neutro Abs: 4658 cells/uL (ref 1500–7800)
Neutrophils Relative %: 65.6 %
Platelets: 269 10*3/uL (ref 140–400)
RBC: 3.51 10*6/uL — ABNORMAL LOW (ref 3.80–5.10)
RDW: 12 % (ref 11.0–15.0)
Total Lymphocyte: 25.4 %
WBC: 7.1 10*3/uL (ref 3.8–10.8)

## 2022-05-25 LAB — POCT URINALYSIS DIPSTICK
Bilirubin, UA: NEGATIVE
Glucose, UA: NEGATIVE
Ketones, UA: NEGATIVE
Nitrite, UA: NEGATIVE
Protein, UA: POSITIVE — AB
Spec Grav, UA: 1.025 (ref 1.010–1.025)
Urobilinogen, UA: 0.2 E.U./dL
pH, UA: 6 (ref 5.0–8.0)

## 2022-05-25 NOTE — Progress Notes (Signed)
Provider: Richarda Blade FNP-C  Tareka Jhaveri, Donalee Citrin, NP  Patient Care Team: Phineas Mcenroe, Donalee Citrin, NP as PCP - General (Family Medicine) Hanley Seamen Dustin Folks, MD as Referring Physician (Optometry) Ranee Gosselin, MD as Consulting Physician (Orthopedic Surgery) Sallye Lat, MD as Consulting Physician (Ophthalmology) Meryl Dare, MD as Consulting Physician (Gastroenterology) Riki Altes, MD (Urology) Stechschulte, Hyman Hopes, MD as Consulting Physician (Surgery) Malachy Mood, MD as Consulting Physician (Hematology)  Extended Emergency Contact Information Primary Emergency Contact: Lowe,[name] Home Phone: [phone] Mobile Phone: [phone] Relation: Daughter Secondary Emergency Contact: [place], [place] [address] D[place] of [place] Home Phone: [phone] Mobile Phone: [phone] Relation: Sister  Code Status:  Full Code  Goals of care: Advanced Directive information    04/01/2022   12:49 PM  Advanced Directives  Does Patient Have a Medical Advance Directive? No  Would patient like information on creating a medical advance directive? No - Patient declined     Chief Complaint  Patient presents with   Acute Visit    Blood in the urine     HPI:  Pt is a 77 y.o. female seen today for an acute visit for evaluation of blood in the urine and frequency.States seems to be going more than usual.she denies fever,chills,nausea,vomiting,abdominal pain,flank pain,urgency,dysuria or difficult urination.also denies any nose bleed or blood in the stool  Has held Aspirin since Thursday last week.  Last seen by Urologist [phone].   Past Medical History:  Diagnosis Date   Anxiety    Benign essential hypertension    Blood transfusion without reported diagnosis    Cataract    removed bilateraly   Diabetes mellitus without complication (HCC)    Diverticulitis of colon (without mention of hemorrhage)(562.11)    GERD (gastroesophageal reflux  disease)    Goiter, specified as simple    Helicobacter pylori gastritis    Hyperlipidemia LDL goal < 100    Intrahepatic bile duct dilation    Numbness    TOES RT FOOT and toes left foot   Obesity, unspecified    Osteoarthrosis, unspecified whether generalized or localized, lower leg    Osteopenia    Other specified disease of nail    FUNGUS OF FINGERNAILS   Pernicious anemia    Unspecified glaucoma(365.9)    Unspecified vitamin D deficiency    Wears dentures    Wears glasses    Past Surgical History:  Procedure Laterality Date   BACK SURGERY     CATARACT EXTRACTION     right eye 06/20/15 left eye 09/12/15   COLECTOMY  2009   COLONOSCOPY  09/06/2007   Dr Jeani Hawking   DILATION AND CURETTAGE OF UTERUS     ENDOSCOPIC RETROGRADE CHOLANGIOPANCREATOGRAPHY (ERCP) WITH PROPOFOL N/A 03/17/2019   Procedure: ENDOSCOPIC RETROGRADE CHOLANGIOPANCREATOGRAPHY (ERCP) WITH PROPOFOL;  Surgeon: Meryl Dare, MD;  Location: 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   JOINT REPLACEMENT     right   LUMBAR LAMINECTOMY/DECOMPRESSION MICRODISCECTOMY N/A 03/30/2013   Procedure: CENTRAL DECOMPRESSION L4 - L5 AND EXCISION 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;  Surgeon: Jacki Cones, MD;  Location: [facility] ORS;  Service: Orthopedics;  Laterality: N/A;   MULTIPLE TOOTH EXTRACTIONS     REMOVAL OF STONES  03/17/2019   Procedure: REMOVAL OF STONES;  Surgeon: Meryl Dare, MD;  Location: [facility] ENDOSCOPY;  Service: Endoscopy;;   SIGMOIDOSCOPY  02/16/2018   Dr Russella Dar  SPHINCTEROTOMY  03/17/2019   Procedure: SPHINCTEROTOMY;  Surgeon: Meryl Dare, MD;  Location: [facility] ENDOSCOPY;  Service: Endoscopy;;   TOTAL KNEE ARTHROPLASTY Right 02/01/2013   Procedure: RIGHT TOTAL KNEE ARTHROPLASTY;  Surgeon: Jacki Cones, MD;  Location: [facility] ORS;  Service: Orthopedics;  Laterality: Right;   TOTAL KNEE ARTHROPLASTY Left 04/18/2014   Procedure: LEFT TOTAL KNEE  ARTHROPLASTY;  Surgeon: Ranee Gosselin, MD;  Location: [facility] ORS;  Service: Orthopedics;  Laterality: Left;    Allergies  Allergen Reactions   Aspirin Nausea Only and Nausea And Vomiting    Can take 81 mg but can't take 325mg , hard on stomach. Can take 81 mg but can't take 325mg , hard on stomach.    Outpatient Encounter Medications as of 05/25/2022  Medication Sig   acetaminophen (TYLENOL) 650 MG CR tablet Take 1,300 mg by mouth as needed.   amLODipine (NORVASC) 10 MG tablet Take 1 tablet by mouth once daily   aspirin EC 81 MG tablet Take 81 mg by mouth daily.   atorvastatin (LIPITOR) 40 MG tablet TAKE 1 TABLET BY MOUTH ONCE DAILY AT 6 PM   brimonidine (ALPHAGAN) 0.2 % ophthalmic solution Place 1 drop into both eyes 2 (two) times daily.   Calcium Carb-Cholecalciferol (CALCIUM 600 + D PO) Take 1 tablet by mouth in the morning.   carboxymethylcellulose (REFRESH PLUS) 0.5 % SOLN Place 1 drop into both eyes 3 (three) times daily as needed (dry eyes).    Cholecalciferol (VITAMIN D-3) 1000 UNITS CAPS Take 1,000 Units by mouth in the morning.   dorzolamide-timolol (COSOPT) 22.3-6.8 MG/ML ophthalmic solution Place 1 drop into both eyes 2 (two) times daily.   gabapentin (NEURONTIN) 300 MG capsule Take 1 capsule by mouth at bedtime   imatinib (GLEEVEC) 100 MG tablet Take 3 tablets (300 mg total) by mouth daily. Take with meals and large glass of water.Caution:Chemotherapy   Iron, Ferrous Sulfate, 325 (65 Fe) MG TABS Take 325 mg by mouth daily.   Lidocaine HCl 4 % CREA Apply 1 application topically 2 (two) times daily as needed (knee pain.).   lisinopril (ZESTRIL) 10 MG tablet Take 1 tablet (10 mg total) by mouth daily.   loperamide (IMODIUM A-D) 2 MG tablet Take 1 mg by mouth daily.   Multiple Vitamin (MULTIVITAMIN WITH MINERALS) TABS tablet Take 1 tablet by mouth daily.   nystatin cream (MYCOSTATIN) Apply 1 Application topically 2 (two) times daily.   Omega-3 Fatty Acids (OMEGA 3 PO) Take 1,200 mg by  mouth in the morning.   ondansetron (ZOFRAN) 8 MG tablet Take 1 tablet (8 mg total) by mouth every 8 (eight) hours as needed for nausea or vomiting.   pantoprazole (PROTONIX) 40 MG tablet Take 1 tablet (40 mg total) by mouth daily.   ROCKLATAN 0.02-0.005 % SOLN Apply 1 drop to eye at bedtime.   Zinc Oxide (DESITIN EX) Apply 1 application topically as needed.   [DISCONTINUED] latanoprost (XALATAN) 0.005 % ophthalmic solution Place 1 drop into both eyes at bedtime.   No facility-administered encounter medications on file as of 05/25/2022.    Review of Systems  Constitutional:  Negative for appetite change, chills, fatigue, fever and unexpected weight change.  HENT:  Negative for congestion, dental problem, ear discharge, ear pain, facial swelling, hearing loss, nosebleeds, postnasal drip, rhinorrhea, sinus pressure, sinus pain, sneezing, sore throat, tinnitus and trouble swallowing.   Respiratory:  Negative for cough, chest tightness, shortness of breath and wheezing.   Cardiovascular:  Negative for chest pain, palpitations  and leg swelling.  Gastrointestinal:  Negative for abdominal distention, abdominal pain, blood in stool, constipation, diarrhea, nausea and vomiting.  Endocrine: Negative for cold intolerance, heat intolerance, polydipsia, polyphagia and polyuria.  Genitourinary:  Positive for hematuria. Negative for difficulty urinating, dysuria, flank pain, frequency and urgency.  Musculoskeletal:  Negative for arthralgias, back pain, gait problem, joint swelling, myalgias, neck pain and neck stiffness.  Skin:  Negative for color change, pallor, rash and wound.  Neurological:  Negative for dizziness, syncope, speech difficulty, weakness, light-headedness, numbness and headaches.  Hematological:  Does not bruise/bleed easily.  Psychiatric/Behavioral:  Negative for agitation, behavioral problems, confusion, hallucinations, self-injury, sleep disturbance and suicidal ideas. The patient is not  nervous/anxious.     Immunization History  Administered Date(s) Administered   Fluad Quad(high Dose 65+) 09/05/2018, 09/26/2020, 10/01/2021, 10/02/2021   Influenza, High Dose Seasonal PF 10/02/2017, 11/20/2019   Influenza,inj,Quad PF,6+ Mos 10/03/2012, 09/21/2014, 11/07/2015   Influenza,inj,quad, With Preservative 11/12/2016   Influenza-Unspecified 10/28/2013   PFIZER(Purple Top)SARS-COV-2 Vaccination 03/25/2019, 03/25/2019, 04/18/2019, 11/01/2019, 05/23/2020, 10/07/2020   Pneumococcal Conjugate-13 01/04/2014, 01/04/2014, 11/04/2016   Pneumococcal Polysaccharide-23 12/29/2012, 11/19/2017   Zoster Recombinat (Shingrix) 11/25/2016, 01/26/2017   Pertinent  Health Maintenance Due  Topic Date Due   FOOT EXAM  02/19/2022   OPHTHALMOLOGY EXAM  07/03/2022   INFLUENZA VACCINE  08/13/2022   HEMOGLOBIN A1C  09/30/2022   COLONOSCOPY (Pts 45-81yrs Insurance coverage will need to be confirmed)  02/17/2023   DEXA SCAN  Completed      01/01/2022    2:00 PM 01/13/2022    3:03 PM 01/14/2022   11:46 AM 03/10/2022    1:28 PM 04/01/2022   12:51 PM  Fall Risk  Falls in the past year?    0 0  Was there an injury with Fall?    0 0  Fall Risk Category Calculator    0 0  (RETIRED) Patient Fall Risk Level Low fall risk Low fall risk Low fall risk    Patient at Risk for Falls Due to    No Fall Risks No Fall Risks  Fall risk Follow up    Falls evaluation completed Falls evaluation completed   Functional Status Survey:    Vitals:   05/25/22 0902  BP: 132/80  Pulse: 75  Temp: (!) 94.7 F (34.8 C)  SpO2: 99%  Weight: 183 lb 6.4 oz (83.2 kg)  Height: 5\' 5"  (1.651 m)   Body mass index is 30.52 kg/m. Physical Exam Vitals reviewed.  Constitutional:      General: She is not in acute distress.    Appearance: Normal appearance. She is obese. She is not ill-appearing or diaphoretic.  HENT:     Head: Normocephalic.     Nose: Nose normal. No congestion or rhinorrhea.     Mouth/Throat:     Mouth:  Mucous membranes are moist.     Pharynx: Oropharynx is clear. No oropharyngeal exudate or posterior oropharyngeal erythema.  Eyes:     General: No scleral icterus.       Right eye: No discharge.        Left eye: No discharge.     Extraocular Movements: Extraocular movements intact.     Conjunctiva/sclera: Conjunctivae normal.     Pupils: Pupils are equal, round, and reactive to light.  Cardiovascular:     Rate and Rhythm: Normal rate and regular rhythm.     Pulses: Normal pulses.     Heart sounds: Normal heart sounds. No murmur heard.    No  friction rub. No gallop.  Pulmonary:     Effort: Pulmonary effort is normal. No respiratory distress.     Breath sounds: Normal breath sounds. No wheezing, rhonchi or rales.  Chest:     Chest wall: No tenderness.  Abdominal:     General: Bowel sounds are normal. There is no distension.     Palpations: Abdomen is soft. There is no mass.     Tenderness: There is no abdominal tenderness. There is no right CVA tenderness, left CVA tenderness, guarding or rebound.  Musculoskeletal:        General: No swelling or tenderness. Normal range of motion.     Right lower leg: No edema.     Left lower leg: No edema.  Skin:    General: Skin is warm and dry.     Coloration: Skin is not pale.     Findings: No bruising, erythema, lesion or rash.  Neurological:     Mental Status: She is alert and oriented to person, place, and time.     Motor: No weakness.     Gait: Gait normal.  Psychiatric:        Mood and Affect: Mood normal.        Speech: Speech normal.        Behavior: Behavior normal.     Labs reviewed: Recent Labs    01/14/22 1127 03/23/22 1349 03/30/22 0841  NA 137 136 140  K 3.3* 3.8 4.2  CL 108 105 108  CO2 23 27 25   GLUCOSE 138* 98 95  BUN 9 13 13   CREATININE 0.80 1.06* 1.07*  CALCIUM 8.8* 9.0 8.8   Recent Labs    10/02/21 1258 01/01/22 1331 03/23/22 1349 03/30/22 0841  AST 21 21 20 18   ALT 17 12 13 12   ALKPHOS 73 61 62   --   BILITOT 0.3 0.5 0.5 0.5  PROT 6.5 7.4 7.2 6.8  ALBUMIN 3.9 4.3 4.2  --    Recent Labs    01/14/22 1127 03/23/22 1349 03/30/22 0841  WBC 7.1 6.8 5.4  NEUTROABS 4.8 3.9 3,413  HGB 11.5* 10.3* 10.9*  HCT 33.8* 31.4* 33.6*  MCV 95.2 94.6 94.9  PLT 298 260 302   Lab Results  Component Value Date   TSH 0.62 03/30/2022   Lab Results  Component Value Date   HGBA1C 5.5 03/30/2022   Lab Results  Component Value Date   CHOL 94 03/30/2022   HDL 39 (L) 03/30/2022   LDLCALC 42 03/30/2022   TRIG 54 03/30/2022   CHOLHDL 2.4 03/30/2022    Significant Diagnostic Results in last 30 days:  No results found.  Assessment/Plan  Hematuria, unspecified type Afebrile  - POCT urinalysis dipstick indicates dark yellow cloudy urine with large 3+ hemolyzed blood and Leukocytes 1+  - Encouraged to increase water intake  - will send urine specimen for culture  - continue to follow up with Urologist - Advised to continue to hold Asprin  - Notify provide if symptoms worsen or running any fever or chills.  - CBC/diff  - Urine culture   Family/ staff Communication: Reviewed plan of care with patient verbalized understanding   Labs/tests ordered:  - CBC/diff  - Urine culture   Next Appointment: Return if symptoms worsen or fail to improve.   Caesar Bookman, NP

## 2022-05-25 NOTE — Patient Instructions (Addendum)
-   Please continue to Hold Asprin  - Notify provide if symptoms worsen or running any fever or chills.

## 2022-05-26 LAB — URINE CULTURE
MICRO NUMBER:: 14948675
SPECIMEN QUALITY:: ADEQUATE

## 2022-06-01 ENCOUNTER — Ambulatory Visit: Payer: Medicare Other | Admitting: Podiatry

## 2022-06-01 ENCOUNTER — Ambulatory Visit (INDEPENDENT_AMBULATORY_CARE_PROVIDER_SITE_OTHER): Payer: Medicare Other | Admitting: Podiatry

## 2022-06-01 ENCOUNTER — Encounter: Payer: Self-pay | Admitting: Podiatry

## 2022-06-01 DIAGNOSIS — E1142 Type 2 diabetes mellitus with diabetic polyneuropathy: Secondary | ICD-10-CM | POA: Diagnosis not present

## 2022-06-01 DIAGNOSIS — E119 Type 2 diabetes mellitus without complications: Secondary | ICD-10-CM | POA: Diagnosis not present

## 2022-06-01 DIAGNOSIS — B351 Tinea unguium: Secondary | ICD-10-CM | POA: Diagnosis not present

## 2022-06-01 DIAGNOSIS — M79674 Pain in right toe(s): Secondary | ICD-10-CM

## 2022-06-01 DIAGNOSIS — M79675 Pain in left toe(s): Secondary | ICD-10-CM | POA: Diagnosis not present

## 2022-06-01 NOTE — Progress Notes (Signed)
  Subjective:  Patient ID: Alison [name], female    DOB: 02/25/1945,   MRN: [id number]  No chief complaint on file.   77 y.o. female presents for diabetic foot exam and  concern of thickened elongated and painful nails that are difficult to trim. Requesting to have them trimmed today. Relates burning and tingling in their feet. Patient is diabetic and last A1c was  Lab Results  Component Value Date   HGBA1C 5.5 03/30/2022   .   PCP:  Ngetich, Donalee Citrin, NP    . Denies any other pedal complaints. Denies n/v/f/c.   Past Medical History:  Diagnosis Date   Anxiety    Benign essential hypertension    Blood transfusion without reported diagnosis    Cataract    removed bilateraly   Diabetes mellitus without complication (HCC)    Diverticulitis of colon (without mention of hemorrhage)(562.11)    GERD (gastroesophageal reflux disease)    Goiter, specified as simple    Helicobacter pylori gastritis    Hyperlipidemia LDL goal < 100    Intrahepatic bile duct dilation    Numbness    TOES RT FOOT and toes left foot   Obesity, unspecified    Osteoarthrosis, unspecified whether generalized or localized, lower leg    Osteopenia    Other specified disease of nail    FUNGUS OF FINGERNAILS   Pernicious anemia    Unspecified glaucoma(365.9)    Unspecified vitamin D deficiency    Wears dentures    Wears glasses     Objective:  Physical Exam: Vascular: DP/PT pulses 2/4 bilateral. CFT <3 seconds. Absent hair growth on digits. Edema noted to bilateral lower extremities. Xerosis noted bilaterally.  Skin. No lacerations or abrasions bilateral feet. Nails 1-5 bilateral  are thickened discolored and elongated with subungual debris. Hyperkeratotic tissue noted sub fifth metatarsal head bilateral.  Musculoskeletal: MMT 5/5 bilateral lower extremities in DF, PF, Inversion and Eversion. Deceased ROM in DF of ankle joint.  Neurological: Sensation intact to light touch. Protective sensation diminished  bilateral.    Assessment:   1. Pain due to onychomycosis of toenails of both feet   2. Diabetic peripheral neuropathy associated with type 2 diabetes mellitus (HCC)   3. Encounter for diabetic foot exam (HCC)      Plan:  Patient was evaluated and treated and all questions answered. -Discussed and educated patient on diabetic foot care, especially with  regards to the vascular, neurological and musculoskeletal systems.  -Stressed the importance of good glycemic control and the detriment of not  controlling glucose levels in relation to the foot. -Discussed supportive shoes at all times and checking feet regularly.  -Mechanically debrided all nails 1-5 bilateral using sterile nail nipper and filed with dremel without incident  -Hyperkeratotic tissue debrided without inicident.  -Answered all patient questions -Patient to return  in 3 months for at risk foot care -Patient advised to call the office if any problems or questions arise in the meantime.   Louann Sjogren, DPM

## 2022-06-19 ENCOUNTER — Other Ambulatory Visit: Payer: Self-pay | Admitting: Family

## 2022-06-19 DIAGNOSIS — E1149 Type 2 diabetes mellitus with other diabetic neurological complication: Secondary | ICD-10-CM

## 2022-06-19 DIAGNOSIS — I10 Essential (primary) hypertension: Secondary | ICD-10-CM

## 2022-06-25 ENCOUNTER — Other Ambulatory Visit: Payer: Self-pay

## 2022-06-25 DIAGNOSIS — C49A0 Gastrointestinal stromal tumor, unspecified site: Secondary | ICD-10-CM

## 2022-06-25 MED ORDER — IMATINIB MESYLATE 100 MG PO TABS: 300.0000 mg | ORAL_TABLET | Freq: Every day | ORAL | 2 refills | Status: DC

## 2022-06-26 DIAGNOSIS — M25562 Pain in left knee: Secondary | ICD-10-CM | POA: Diagnosis not present

## 2022-06-26 DIAGNOSIS — M25561 Pain in right knee: Secondary | ICD-10-CM | POA: Diagnosis not present

## 2022-06-29 ENCOUNTER — Other Ambulatory Visit: Payer: Self-pay | Admitting: Family

## 2022-06-29 DIAGNOSIS — D509 Iron deficiency anemia, unspecified: Secondary | ICD-10-CM

## 2022-07-03 ENCOUNTER — Ambulatory Visit: Payer: Medicare Other | Admitting: Adult Health

## 2022-07-07 ENCOUNTER — Encounter: Payer: Medicare Other | Admitting: Family

## 2022-07-13 ENCOUNTER — Ambulatory Visit (HOSPITAL_COMMUNITY)
Admission: RE | Admit: 2022-07-13 | Discharge: 2022-07-13 | Disposition: A | Discharge status: Home / Self Care | Payer: Medicare Other | Source: Ambulatory Visit | Attending: Hematology | Admitting: Hematology

## 2022-07-13 ENCOUNTER — Other Ambulatory Visit: Payer: Self-pay

## 2022-07-13 ENCOUNTER — Inpatient Hospital Stay: Payer: Medicare Other | Attending: Hematology

## 2022-07-13 DIAGNOSIS — C49A0 Gastrointestinal stromal tumor, unspecified site: Secondary | ICD-10-CM | POA: Diagnosis not present

## 2022-07-13 DIAGNOSIS — R918 Other nonspecific abnormal finding of lung field: Secondary | ICD-10-CM | POA: Insufficient documentation

## 2022-07-13 DIAGNOSIS — Z903 Acquired absence of stomach [part of]: Secondary | ICD-10-CM | POA: Insufficient documentation

## 2022-07-13 DIAGNOSIS — Z931 Gastrostomy status: Secondary | ICD-10-CM | POA: Insufficient documentation

## 2022-07-13 DIAGNOSIS — R319 Hematuria, unspecified: Secondary | ICD-10-CM | POA: Insufficient documentation

## 2022-07-13 DIAGNOSIS — I700 Atherosclerosis of aorta: Secondary | ICD-10-CM | POA: Diagnosis not present

## 2022-07-13 DIAGNOSIS — Z79899 Other long term (current) drug therapy: Secondary | ICD-10-CM | POA: Insufficient documentation

## 2022-07-13 DIAGNOSIS — R197 Diarrhea, unspecified: Secondary | ICD-10-CM | POA: Insufficient documentation

## 2022-07-13 DIAGNOSIS — K8020 Calculus of gallbladder without cholecystitis without obstruction: Secondary | ICD-10-CM | POA: Diagnosis not present

## 2022-07-13 DIAGNOSIS — C49A2 Gastrointestinal stromal tumor of stomach: Secondary | ICD-10-CM | POA: Insufficient documentation

## 2022-07-13 LAB — CBC WITH DIFFERENTIAL (CANCER CENTER ONLY)
Abs Immature Granulocytes: 0.01 10*3/uL (ref 0.00–0.07)
Basophils Absolute: 0 10*3/uL (ref 0.0–0.1)
Basophils Relative: 1 %
Eosinophils Absolute: 0.1 10*3/uL (ref 0.0–0.5)
Eosinophils Relative: 1 %
HCT: 31.6 % — ABNORMAL LOW (ref 36.0–46.0)
Hemoglobin: 10.4 g/dL — ABNORMAL LOW (ref 12.0–15.0)
Immature Granulocytes: 0 %
Lymphocytes Relative: 27 %
Lymphs Abs: 1.9 10*3/uL (ref 0.7–4.0)
MCH: 31.5 pg (ref 26.0–34.0)
MCHC: 32.9 g/dL (ref 30.0–36.0)
MCV: 95.8 fL (ref 80.0–100.0)
Monocytes Absolute: 0.6 10*3/uL (ref 0.1–1.0)
Monocytes Relative: 9 %
Neutro Abs: 4.4 10*3/uL (ref 1.7–7.7)
Neutrophils Relative %: 62 %
Platelet Count: 277 10*3/uL (ref 150–400)
RBC: 3.3 MIL/uL — ABNORMAL LOW (ref 3.87–5.11)
RDW: 13.2 % (ref 11.5–15.5)
WBC Count: 7 10*3/uL (ref 4.0–10.5)
nRBC: 0 % (ref 0.0–0.2)

## 2022-07-13 LAB — CMP (CANCER CENTER ONLY)
ALT: 12 U/L (ref 0–44)
AST: 19 U/L (ref 15–41)
Albumin: 3.8 g/dL (ref 3.5–5.0)
Alkaline Phosphatase: 47 U/L (ref 38–126)
Anion gap: 3 — ABNORMAL LOW (ref 5–15)
BUN: 13 mg/dL (ref 8–23)
CO2: 28 mmol/L (ref 22–32)
Calcium: 8.7 mg/dL — ABNORMAL LOW (ref 8.9–10.3)
Chloride: 108 mmol/L (ref 98–111)
Creatinine: 1.07 mg/dL — ABNORMAL HIGH (ref 0.44–1.00)
GFR, Estimated: 53 mL/min — ABNORMAL LOW (ref >60)
Glucose, Bld: 87 mg/dL (ref 70–99)
Potassium: 4.1 mmol/L (ref 3.5–5.1)
Sodium: 139 mmol/L (ref 135–145)
Total Bilirubin: 0.5 mg/dL (ref 0.3–1.2)
Total Protein: 6.8 g/dL (ref 6.5–8.1)

## 2022-07-13 MED ORDER — SODIUM CHLORIDE (PF) 0.9 % IJ SOLN
INTRAMUSCULAR | Status: AC
  Filled 2022-07-13: qty 50

## 2022-07-13 MED ORDER — IOHEXOL 300 MG/ML  SOLN
100.0000 mL | Freq: Once | INTRAMUSCULAR | Status: AC | PRN
  Administered 2022-07-13: 100 mL via INTRAVENOUS

## 2022-07-15 ENCOUNTER — Other Ambulatory Visit: Payer: Self-pay

## 2022-07-15 ENCOUNTER — Inpatient Hospital Stay (HOSPITAL_BASED_OUTPATIENT_CLINIC_OR_DEPARTMENT_OTHER): Payer: Medicare Other | Admitting: Hematology

## 2022-07-15 VITALS — BP 147/74 | HR 72 | Temp 97.8°F | Resp 18 | Ht 65.0 in | Wt 181.2 lb

## 2022-07-15 DIAGNOSIS — R918 Other nonspecific abnormal finding of lung field: Secondary | ICD-10-CM | POA: Diagnosis not present

## 2022-07-15 DIAGNOSIS — C49A0 Gastrointestinal stromal tumor, unspecified site: Secondary | ICD-10-CM

## 2022-07-15 DIAGNOSIS — R319 Hematuria, unspecified: Secondary | ICD-10-CM | POA: Diagnosis not present

## 2022-07-15 DIAGNOSIS — Z903 Acquired absence of stomach [part of]: Secondary | ICD-10-CM | POA: Diagnosis not present

## 2022-07-15 DIAGNOSIS — Z931 Gastrostomy status: Secondary | ICD-10-CM | POA: Diagnosis not present

## 2022-07-15 DIAGNOSIS — Z79899 Other long term (current) drug therapy: Secondary | ICD-10-CM | POA: Diagnosis not present

## 2022-07-15 DIAGNOSIS — R197 Diarrhea, unspecified: Secondary | ICD-10-CM | POA: Diagnosis not present

## 2022-07-15 DIAGNOSIS — C49A2 Gastrointestinal stromal tumor of stomach: Secondary | ICD-10-CM | POA: Diagnosis not present

## 2022-07-15 NOTE — Progress Notes (Signed)
[facility]   [phone] [phone]   Clinic Follow up Note   Patient Care Team: Ngetich, Donalee Citrin, NP as PCP - General (Family Medicine) Hanley Seamen, Dustin Folks, MD as Referring Physician (Optometry) Ranee Gosselin, MD as Consulting Physician (Orthopedic Surgery) Sallye Lat, MD as Consulting Physician (Ophthalmology) Meryl Dare, MD as Consulting Physician (Gastroenterology) Riki Altes, MD (Urology) Stechschulte, Hyman Hopes, MD as Consulting Physician (Surgery) Malachy Mood, MD as Consulting Physician (Hematology)  Date of Service:  07/15/2022  CHIEF COMPLAINT: f/u of GIST   CURRENT THERAPY:  Gleevec, started 03/09/21, currently 300mg    ASSESSMENT:  Alison [name] is a 77 y.o. 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 S/p partial gastrectomy 01/14/21 by Dr. 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 No lymph nodes were evaluated.  -she has high risk of recurrence (86%) due to the large size and high grade.  -staging chest CT 02/17/21 negative for metastatic disease. 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 She is tolerating well so far with mild to moderate diarrhea, improved with Imodium. She developed worsening epigastric pain, and Gleevec was held for a month. -she restarted Gleevec at 100 mg on 05/22/21 and gradually increased to 300 mg daily in June, she is tolerating well so far  -restaging CT CAP from 12/31/2021 showed no evidence of recurrence.  Previous nodular area adjacent to the suture margin has resolved. -She is clinically doing well, tolerating imatinib well, no clinical concern for recurrence. -I personally reviewed her surveillance CT scan from July 13, 2022, which showed a new 6 mm  nodule near the suture line of previous partial gastrostomy, local recurrence is not ruled out.  She also has a few new tiny lung nodules, likely benign.  I discussed the findings with patient and her sister. -I recommend EGD evaluation, to rule out a local recurrence.  I will reach out to to her GI Dr. Russella Dar, she is agreeable -Plan to repeat CT chest, abdomen pelvis in 3 months. -Continue Gleevac.       PLAN: -reviewed CT scan with pt I recommend an endoscopy in next month -I order CT scan in 3 months -I will reach out to Dr.Stark -Continue Gleevec 300 mg daily -lab and f/u in 3 months  SUMMARY OF ONCOLOGIC HISTORY: Oncology History   No history exists.     INTERVAL HISTORY:  Alison [name] is here for a follow up of GIST. She was last seen by me on 03/23/2022. She presents to the clinic accompanied by sister. Pt state that she has been having issues with blood in her urine off and on. Pt state that she has seen her urologist for it.    All other systems were reviewed with the patient and are negative.  MEDICAL HISTORY:  Past Medical History:  Diagnosis Date   Anxiety    Benign essential hypertension    Blood transfusion without reported diagnosis    Cataract    removed bilateraly   Diabetes mellitus without complication (HCC)    Diverticulitis of colon (without mention of hemorrhage)(562.11)    GERD (gastroesophageal reflux disease)    Goiter, specified as simple    Helicobacter pylori gastritis    Hyperlipidemia LDL goal < 100    Intrahepatic bile duct  dilation    Numbness    TOES RT FOOT and toes left foot   Obesity, unspecified    Osteoarthrosis, unspecified whether generalized or localized, lower leg    Osteopenia    Other specified disease of nail    FUNGUS OF FINGERNAILS   Pernicious anemia    Unspecified glaucoma(365.9)    Unspecified vitamin D deficiency    Wears dentures    Wears glasses     SURGICAL HISTORY: Past Surgical History:  Procedure  Laterality Date   BACK SURGERY     CATARACT EXTRACTION     right eye 06/20/15 left eye 09/12/15   COLECTOMY  2009   COLONOSCOPY  09/06/2007   Dr Jeani Hawking   DILATION AND CURETTAGE OF UTERUS     ENDOSCOPIC RETROGRADE CHOLANGIOPANCREATOGRAPHY (ERCP) WITH PROPOFOL N/A 03/17/2019   Procedure: ENDOSCOPIC RETROGRADE CHOLANGIOPANCREATOGRAPHY (ERCP) WITH PROPOFOL;  Surgeon: Meryl Dare, MD;  Location: [facility] ENDOSCOPY;  Service: Endoscopy;  Laterality: N/A;   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   JOINT REPLACEMENT     right   LUMBAR LAMINECTOMY/DECOMPRESSION MICRODISCECTOMY N/A 03/30/2013   Procedure: CENTRAL DECOMPRESSION L4 - L5 AND EXCISION 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;  Surgeon: Jacki Cones, MD;  Location: [facility] ORS;  Service: Orthopedics;  Laterality: N/A;   MULTIPLE TOOTH EXTRACTIONS     REMOVAL OF STONES  03/17/2019   Procedure: REMOVAL OF STONES;  Surgeon: Meryl Dare, MD;  Location: [facility] ENDOSCOPY;  Service: Endoscopy;;   SIGMOIDOSCOPY  02/16/2018   Dr Russella Dar   SPHINCTEROTOMY  03/17/2019   Procedure: SPHINCTEROTOMY;  Surgeon: Meryl Dare, MD;  Location: [facility] END[facility]COPY;  Service: Endoscopy;;   TOTAL KNEE ARTHROPLASTY Right 02/01/2013   Procedure: RIGHT TOTAL KNEE ARTHROPLASTY;  Surgeon: Jacki Cones, MD;  Location: [facility] ORS;  Service: Orthopedics;  Laterality: Right;   TOTAL KNEE ARTHROPLASTY Left 04/18/2014   Procedure: LEFT TOTAL KNEE ARTHROPLASTY;  Surgeon: Ranee Gosselin, MD;  Location: [facility] ORS;  Service: Orthopedics;  Laterality: Left;    I have reviewed the social history and family history with the patient and they are unchanged from previous note.  ALLERGIES:  is allergic to aspirin.  MEDICATIONS:  Current Outpatient Medications  Medication Sig Dispense Refill   acetaminophen (TYLENOL) 650 MG CR tablet Take 1,300 mg by mouth as needed.     amLODipine (NORVASC) 10 MG tablet Take 1 tablet by mouth once daily 90 tablet 0   atorvastatin (LIPITOR) 40 MG  tablet TAKE 1 TABLET BY MOUTH ONCE DAILY AT 6 PM 90 tablet 1   brimonidine (ALPHAGAN) 0.2 % ophthalmic solution Place 1 drop into both eyes 2 (two) times daily.     Calcium Carb-Cholecalciferol (CALCIUM 600 + D PO) Take 1 tablet by mouth in the morning.     carboxymethylcellulose (REFRESH PLUS) 0.5 % SOLN Place 1 drop into both eyes 3 (three) times daily as needed (dry eyes).      Cholecalciferol (VITAMIN D-3) 1000 UNITS CAPS Take 1,000 Units by mouth in the morning.     dorzolamide-timolol (COSOPT) 22.3-6.8 MG/ML ophthalmic solution Place 1 drop into both eyes 2 (two) times daily.     Ferrous Sulfate (IRON) 325 (65 Fe) MG TABS Take 1 tablet by mouth once daily 30 tablet 0   gabapentin (NEURONTIN) 300 MG capsule Take 1 capsule by mouth at bedtime 90 capsule 0   imatinib (GLEEVEC) 100 MG tablet Take 3 tablets (300 mg total) by mouth daily. Take with meals and  large glass of water.Caution:Chemotherapy 90 tablet 2   Lidocaine HCl 4 % CREA Apply 1 application topically 2 (two) times daily as needed (knee pain.).     lisinopril (ZESTRIL) 10 MG tablet Take 1 tablet (10 mg total) by mouth daily. 90 tablet 1   loperamide (IMODIUM A-D) 2 MG tablet Take 1 mg by mouth daily as needed.     Multiple Vitamin (MULTIVITAMIN WITH MINERALS) TABS tablet Take 1 tablet by mouth daily.     nystatin cream (MYCOSTATIN) Apply 1 Application topically 2 (two) times daily. 30 g 3   Omega-3 Fatty Acids (OMEGA 3 PO) Take 1,200 mg by mouth in the morning.     ondansetron (ZOFRAN) 8 MG tablet Take 1 tablet (8 mg total) by mouth every 8 (eight) hours as needed for nausea or vomiting. 20 tablet 2   pantoprazole (PROTONIX) 40 MG tablet Take 1 tablet (40 mg total) by mouth daily. 90 tablet 3   ROCKLATAN 0.02-0.005 % SOLN Apply 1 drop to eye at bedtime.     Zinc Oxide (DESITIN EX) Apply 1 application topically as needed.     No current facility-administered medications for this visit.    PHYSICAL EXAMINATION: ECOG PERFORMANCE  STATUS: 0 - Asymptomatic  There were no vitals filed for this visit. Wt Readings from Last 3 Encounters:  05/25/22 183 lb 6.4 oz (83.2 kg)  04/30/22 183 lb 4 oz (83.1 kg)  04/01/22 184 lb (83.5 kg)     GENERAL:alert, no distress and comfortable SKIN: skin color normal, no rashes or significant lesions EYES: normal, Conjunctiva are pink and non-injected, sclera clear  NEURO: alert & oriented x 3 with fluent speech  LABORATORY DATA:  I have reviewed the data as listed    Latest Ref Rng & Units 07/13/2022    9:51 AM 05/25/2022    9:49 AM 03/30/2022    8:41 AM  CBC  WBC 4.0 - 10.5 K/uL 7.0  7.1  5.4   Hemoglobin 12.0 - 15.0 g/dL 16.1  09.6  04.5   Hematocrit 36.0 - 46.0 % 31.6  33.7  33.6   Platelets 150 - 400 K/uL 277  269  302         Latest Ref Rng & Units 07/13/2022    9:51 AM 03/30/2022    8:41 AM 03/23/2022    1:49 PM  CMP  Glucose 70 - 99 mg/dL 87  95  98   BUN 8 - 23 mg/dL 13  13  13    Creatinine 0.44 - 1.00 mg/dL 4.09  8.11  9.14   Sodium 135 - 145 mmol/L 139  140  136   Potassium 3.5 - 5.1 mmol/L 4.1  4.2  3.8   Chloride 98 - 111 mmol/L 108  108  105   CO2 22 - 32 mmol/L 28  25  27    Calcium 8.9 - 10.3 mg/dL 8.7  8.8  9.0   Total Protein 6.5 - 8.1 g/dL 6.8  6.8  7.2   Total Bilirubin 0.3 - 1.2 mg/dL 0.5  0.5  0.5   Alkaline Phos 38 - 126 U/L 47   62   AST 15 - 41 U/L 19  18  20    ALT 0 - 44 U/L 12  12  13        RADIOGRAPHIC STUDIES: I have personally reviewed the radiological images as listed and agreed with the findings in the report. No results found.    No orders of the defined types  were placed in this encounter.  All questions were answered. The patient knows to call the clinic with any problems, questions or concerns. No barriers to learning was detected. The total time spent in the appointment was 25 minutes.     Malachy Mood, MD 07/15/2022   Carolin Coy, CMA, am acting as scribe for Malachy Mood, MD.   I have reviewed the above documentation for  accuracy and completeness, and I agree with the above.

## 2022-07-16 ENCOUNTER — Encounter: Payer: Self-pay | Admitting: Hematology

## 2022-07-17 ENCOUNTER — Telehealth: Payer: Self-pay | Admitting: *Deleted

## 2022-07-17 NOTE — Telephone Encounter (Signed)
-----   Message from Meryl Dare, MD sent at 07/15/2022  6:33 PM EDT ----- Regarding: RE: Madelyn Flavors, We will contact her and schedule an EGD. Thanks, Judie Petit ----- Message ----- From: Malachy Mood, MD Sent: 07/15/2022   2:25 PM EDT To: Meryl Dare, MD  Judie Petit,  Please review her recent CT, and let me know if you can schedule her for EGD evaluation. I plan to repeat CT in 3 months  Thanks  Terrace Arabia

## 2022-07-17 NOTE — Telephone Encounter (Signed)
I have spoken to patient and scheduled endoscopy for 07/29/22. Unfortunately, no previsits available before this date (patient was seen in office though 04/2022). I have advised patient that I will make specific prep instructions available for her. She would like to pick them up from the office. These have been placed at the front desk and I advised that I am glad to answer any questions she has when she looks over the instructions. She is advised she will need a care partner 18+ to bring her, stay with her and take her home from procedure.

## 2022-07-17 NOTE — Telephone Encounter (Signed)
Attempted to reach patient on home phone but no answer, no voicemail. I did leave a message on mobile number for patient to call back.

## 2022-07-20 ENCOUNTER — Ambulatory Visit (INDEPENDENT_AMBULATORY_CARE_PROVIDER_SITE_OTHER): Payer: Medicare Other | Admitting: Family

## 2022-07-20 ENCOUNTER — Encounter: Payer: Self-pay | Admitting: Family

## 2022-07-20 VITALS — BP 126/80 | HR 66 | Temp 97.8°F | Ht 66.0 in | Wt 181.0 lb

## 2022-07-20 DIAGNOSIS — Z0000 Encounter for general adult medical examination without abnormal findings: Secondary | ICD-10-CM | POA: Diagnosis not present

## 2022-07-20 NOTE — Patient Instructions (Signed)
Ms. First , Thank you for taking time to come for your Medicare Wellness Visit. I appreciate your ongoing commitment to your health goals. Please review the following plan we discussed and let me know if I can assist you in the future.   Screening recommendations/referrals: Colonoscopy : Up to date  Mammogram : Up to date  Bone Density : Up to date  Recommended yearly ophthalmology/optometry visit for glaucoma screening and checkup Recommended yearly dental visit for hygiene and checkup  Vaccinations: Influenza vaccine- due annually in September/October Pneumococcal vaccine: Up to date  Tdap vaccine :  Pleas get Tdap vaccine at the pharmacy. Shingles vaccine : Up to date     Advanced directives: No   Conditions/risks identified: advanced age (>61men, >81 women);hypertension;dyslipidemia;smoking/ tobacco exposure  Next appointment: 1 year    Preventive Care 2 Years and Older, Female Preventive care refers to lifestyle choices and visits with your health care provider that can promote health and wellness. What does preventive care include? A yearly physical exam. This is also called an annual well check. Dental exams once or twice a year. Routine eye exams. Ask your health care provider how often you should have your eyes checked. Personal lifestyle choices, including: Daily care of your teeth and gums. Regular physical activity. Eating a healthy diet. Avoiding tobacco and drug use. Limiting alcohol use. Practicing safe sex. Taking low-dose aspirin every day. Taking vitamin and mineral supplements as recommended by your health care provider. What happens during an annual well check? The services and screenings done by your health care provider during your annual well check will depend on your age, overall health, lifestyle risk factors, and family history of disease. Counseling  Your health care provider may ask you questions about your: Alcohol use. Tobacco use. Drug  use. Emotional well-being. Home and relationship well-being. Sexual activity. Eating habits. History of falls. Memory and ability to understand (cognition). Work and work Astronomer. Reproductive health. Screening  You may have the following tests or measurements: Height, weight, and BMI. Blood pressure. Lipid and cholesterol levels. These may be checked every 5 years, or more frequently if you are over 60 years old. Skin check. Lung cancer screening. You may have this screening every year starting at age 49 if you have a 30-pack-year history of smoking and currently smoke or have quit within the past 15 years. Fecal occult blood test (FOBT) of the stool. You may have this test every year starting at age 38. Flexible sigmoidoscopy or colonoscopy. You may have a sigmoidoscopy every 5 years or a colonoscopy every 10 years starting at age 28. Hepatitis C blood test. Hepatitis B blood test. Sexually transmitted disease (STD) testing. Diabetes screening. This is done by checking your blood sugar (glucose) after you have not eaten for a while (fasting). You may have this done every 1-3 years. Bone density scan. This is done to screen for osteoporosis. You may have this done starting at age 51. Mammogram. This may be done every 1-2 years. Talk to your health care provider about how often you should have regular mammograms. Talk with your health care provider about your test results, treatment options, and if necessary, the need for more tests. Vaccines  Your health care provider may recommend certain vaccines, such as: Influenza vaccine. This is recommended every year. Tetanus, diphtheria, and acellular pertussis (Tdap, Td) vaccine. You may need a Td booster every 10 years. Zoster vaccine. You may need this after age 67. Pneumococcal 13-valent conjugate (PCV13) vaccine. One dose  is recommended after age 61. Pneumococcal polysaccharide (PPSV23) vaccine. One dose is recommended after age  60. Talk to your health care provider about which screenings and vaccines you need and how often you need them. This information is not intended to replace advice given to you by your health care provider. Make sure you discuss any questions you have with your health care provider. Document Released: 01/25/2015 Document Revised: 09/18/2015 Document Reviewed: 10/30/2014 Elsevier Interactive Patient Education  2017 [employer].  Fall Prevention in the Home Falls can cause injuries. They can happen to people of all ages. There are many things you can do to make your home safe and to help prevent falls. What can I do on the outside of my home? Regularly fix the edges of walkways and driveways and fix any cracks. Remove anything that might make you trip as you walk through a door, such as a raised step or threshold. Trim any bushes or trees on the path to your home. Use bright outdoor lighting. Clear any walking paths of anything that might make someone trip, such as rocks or tools. Regularly check to see if handrails are loose or broken. Make sure that both sides of any steps have handrails. Any raised decks and porches should have guardrails on the edges. Have any leaves, snow, or ice cleared regularly. Use sand or salt on walking paths during winter. Clean up any spills in your garage right away. This includes oil or grease spills. What can I do in the bathroom? Use night lights. Install grab bars by the toilet and in the tub and shower. Do not use towel bars as grab bars. Use non-skid mats or decals in the tub or shower. If you need to sit down in the shower, use a plastic, non-slip stool. Keep the floor dry. Clean up any water that spills on the floor as soon as it happens. Remove soap buildup in the tub or shower regularly. Attach bath mats securely with double-sided non-slip rug tape. Do not have throw rugs and other things on the floor that can make you trip. What can I do in the  bedroom? Use night lights. Make sure that you have a light by your bed that is easy to reach. Do not use any sheets or blankets that are too big for your bed. They should not hang down onto the floor. Have a firm chair that has side arms. You can use this for support while you get dressed. Do not have throw rugs and other things on the floor that can make you trip. What can I do in the kitchen? Clean up any spills right away. Avoid walking on wet floors. Keep items that you use a lot in easy-to-reach places. If you need to reach something above you, use a strong step stool that has a grab bar. Keep electrical cords out of the way. Do not use floor polish or wax that makes floors slippery. If you must use wax, use non-skid floor wax. Do not have throw rugs and other things on the floor that can make you trip. What can I do with my stairs? Do not leave any items on the stairs. Make sure that there are handrails on both sides of the stairs and use them. Fix handrails that are broken or loose. Make sure that handrails are as long as the stairways. Check any carpeting to make sure that it is firmly attached to the stairs. Fix any carpet that is loose or worn. Avoid  having throw rugs at the top or bottom of the stairs. If you do have throw rugs, attach them to the floor with carpet tape. Make sure that you have a light switch at the top of the stairs and the bottom of the stairs. If you do not have them, ask someone to add them for you. What else can I do to help prevent falls? Wear shoes that: Do not have high heels. Have rubber bottoms. Are comfortable and fit you well. Are closed at the toe. Do not wear sandals. If you use a stepladder: Make sure that it is fully opened. Do not climb a closed stepladder. Make sure that both sides of the stepladder are locked into place. Ask someone to hold it for you, if possible. Clearly mark and make sure that you can see: Any grab bars or  handrails. First and last steps. Where the edge of each step is. Use tools that help you move around (mobility aids) if they are needed. These include: Canes. Walkers. Scooters. Crutches. Turn on the lights when you go into a dark area. Replace any light bulbs as soon as they burn out. Set up your furniture so you have a clear path. Avoid moving your furniture around. If any of your floors are uneven, fix them. If there are any pets around you, be aware of where they are. Review your medicines with your doctor. Some medicines can make you feel dizzy. This can increase your chance of falling. Ask your doctor what other things that you can do to help prevent falls. This information is not intended to replace advice given to you by your health care provider. Make sure you discuss any questions you have with your health care provider. Document Released: 10/25/2008 Document Revised: 06/06/2015 Document Reviewed: 02/02/2014 Elsevier Interactive Patient Education  2017 [employer].

## 2022-07-20 NOTE — Progress Notes (Signed)
Subjective:   Alison [name] is a 77 y.o. female who presents for Medicare Annual (Subsequent) preventive examination.  Visit Complete: In person  Patient Medicare AWV questionnaire was completed by the patient on 07/20/2022 ; I have confirmed that all information answered by patient is correct and no changes since this date.  Review of Systems    Cardiac Risk Factors include: advanced age (>72men, >79 women);hypertension;dyslipidemia;smoking/ tobacco exposure     Objective:    Today's Vitals   07/20/22 1324  BP: 126/80  Pulse: 66  Temp: 97.8 F (36.6 C)  TempSrc: Temporal  SpO2: 99%  Weight: 181 lb (82.1 kg)  Height: 5\' 6"  (1.676 m)   Body mass index is 29.21 kg/m.     07/20/2022    1:26 PM 04/01/2022   12:49 PM 10/15/2021    9:28 AM 07/10/2021    2:20 PM 07/04/2021   12:42 PM 04/01/2021   10:52 AM 02/28/2021   10:33 AM  Advanced Directives  Does Patient Have a Medical Advance Directive? No No No No No No No  Would patient like information on creating a medical advance directive? No - Patient declined No - Patient declined No - Patient declined No - Patient declined No - Patient declined No - Patient declined No - Patient declined    Current Medications (verified) Outpatient Encounter Medications as of 07/20/2022  Medication Sig   acetaminophen (TYLENOL) 650 MG CR tablet Take 1,300 mg by mouth as needed.   amLODipine (NORVASC) 10 MG tablet Take 1 tablet by mouth once daily   atorvastatin (LIPITOR) 40 MG tablet TAKE 1 TABLET BY MOUTH ONCE DAILY AT 6 PM   brimonidine (ALPHAGAN) 0.2 % ophthalmic solution Place 1 drop into both eyes 2 (two) times daily.   Calcium Carb-Cholecalciferol (CALCIUM 600 + D PO) Take 1 tablet by mouth in the morning.   carboxymethylcellulose (REFRESH PLUS) 0.5 % SOLN Place 1 drop into both eyes 3 (three) times daily as needed (dry eyes).    Cholecalciferol (VITAMIN D-3) 1000 UNITS CAPS Take 1,000 Units by mouth in the morning.   diclofenac Sodium  (VOLTAREN) 1 % GEL Apply 2 g topically as needed.   dorzolamide-timolol (COSOPT) 22.3-6.8 MG/ML ophthalmic solution Place 1 drop into both eyes 2 (two) times daily.   Ferrous Sulfate (IRON) 325 (65 Fe) MG TABS Take 1 tablet by mouth once daily   gabapentin (NEURONTIN) 300 MG capsule Take 1 capsule by mouth at bedtime   imatinib (GLEEVEC) 100 MG tablet Take 3 tablets (300 mg total) by mouth daily. Take with meals and large glass of water.Caution:Chemotherapy   lisinopril (ZESTRIL) 10 MG tablet Take 1 tablet (10 mg total) by mouth daily.   loperamide (IMODIUM A-D) 2 MG tablet Take 1 mg by mouth daily as needed.   Multiple Vitamin (MULTIVITAMIN WITH MINERALS) TABS tablet Take 1 tablet by mouth daily.   nystatin cream (MYCOSTATIN) Apply 1 Application topically 2 (two) times daily.   Omega-3 Fatty Acids (OMEGA 3 PO) Take 1,200 mg by mouth in the morning.   ondansetron (ZOFRAN) 8 MG tablet Take 1 tablet (8 mg total) by mouth every 8 (eight) hours as needed for nausea or vomiting.   pantoprazole (PROTONIX) 40 MG tablet Take 1 tablet (40 mg total) by mouth daily.   ROCKLATAN 0.02-0.005 % SOLN Apply 1 drop to eye at bedtime.   Zinc Oxide (DESITIN EX) Apply 1 application topically as needed.   [DISCONTINUED] Lidocaine HCl 4 % CREA Apply 1 application topically  2 (two) times daily as needed (knee pain.).   No facility-administered encounter medications on file as of 07/20/2022.    Allergies (verified) Aspirin   History: Past Medical History:  Diagnosis Date   Anxiety    Benign essential hypertension    Blood transfusion without reported diagnosis    Cataract    removed bilateraly   Diabetes mellitus without complication (HCC)    Diverticulitis of colon (without mention of hemorrhage)(562.11)    GERD (gastroesophageal reflux disease)    Goiter, specified as simple    Helicobacter pylori gastritis    Hyperlipidemia LDL goal < 100    Intrahepatic bile duct dilation    Numbness    TOES RT FOOT  and toes left foot   Obesity, unspecified    Osteoarthrosis, unspecified whether generalized or localized, lower leg    Osteopenia    Other specified disease of nail    FUNGUS OF FINGERNAILS   Pernicious anemia    Unspecified glaucoma(365.9)    Unspecified vitamin D deficiency    Wears dentures    Wears glasses    Past Surgical History:  Procedure Laterality Date   BACK SURGERY     CATARACT EXTRACTION     right eye 06/20/15 left eye 09/12/15   COLECTOMY  2009   COLONOSCOPY  09/06/2007   Dr Jeani Hawking   DILATION AND CURETTAGE OF UTERUS     ENDOSCOPIC RETROGRADE CHOLANGIOPANCREATOGRAPHY (ERCP) WITH PROPOFOL N/A 03/17/2019   Procedure: ENDOSCOPIC RETROGRADE CHOLANGIOPANCREATOGRAPHY (ERCP) WITH PROPOFOL;  Surgeon: Meryl Dare, MD;  Location: [facility] ENDOSCOPY;  Service: Endoscopy;  Laterality: N/A;   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   JOINT REPLACEMENT     right   LUMBAR LAMINECTOMY/DECOMPRESSION MICRODISCECTOMY N/A 03/30/2013   Procedure: CENTRAL DECOMPRESSION L4 - L5 AND EXCISION 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;  Surgeon: Jacki Cones, MD;  Location: [facility] ORS;  Service: Orthopedics;  Laterality: N/A;   MULTIPLE TOOTH EXTRACTIONS     REMOVAL OF STONES  03/17/2019   Procedure: REMOVAL OF STONES;  Surgeon: Meryl Dare, MD;  Location: [facility] ENDOSCOPY;  Service: Endoscopy;;   SIGMOIDOSCOPY  02/16/2018   Dr Russella Dar   SPHINCTEROTOMY  03/17/2019   Procedure: SPHINCTEROTOMY;  Surgeon: Meryl Dare, MD;  Location: [facility] ENDOSCOPY;  Service: Endoscopy;;   TOTAL KNEE ARTHROPLASTY Right 02/01/2013   Procedure: RIGHT TOTAL KNEE ARTHROPLASTY;  Surgeon: Jacki Cones, MD;  Location: [facility];  Service: Orthopedics;  Laterality: Right;   TOTAL KNEE ARTHROPLASTY Left 04/18/2014   Procedure: LEFT TOTAL KNEE ARTHROPLASTY;  Surgeon: Ranee Gosselin, MD;  Location: [facility];  Service: Orthopedics;  Laterality: Left;   Family History  Problem Relation Age of Onset   Alzheimer's  disease Mother    Stroke Mother    Hyperlipidemia Mother    Heart disease Father    Cancer Sister        breast   Diabetes Sister    Cancer Maternal Aunt        breast   Colon polyps Neg Hx    Colon cancer Neg Hx    Esophageal cancer Neg Hx    Rectal cancer Neg Hx    Stomach cancer Neg Hx    Social History   Socioeconomic History   Marital status: Divorced    Spouse name: Not on file   Number of children: 1   Years of education: Not on file   Highest education level: Not on file  Occupational History  Not on file  Tobacco Use   Smoking status: Former    Packs/day: 0.50    Years: 50.00    Additional pack years: 0.00    Total pack years: 25.00    Types: Cigarettes    Quit date: 03/2019    Years since quitting: 3.3   Smokeless tobacco: Never  Vaping Use   Vaping Use: Never used  Substance and Sexual Activity   Alcohol use: No   Drug use: No   Sexual activity: Not Currently  Other Topics Concern   Not on file  Social History Narrative   [place] -- [place] -- [place] in 2009   1 healthy adult daughter   Single   Lives with daughter, independent but does not drive   Stationary exercises most days of the week    Social Determinants of Health   Financial Resource Strain: Low Risk  (04/26/2017)   Overall Financial Resource Strain (CARDIA)    Difficulty of Paying Living Expenses: Not hard at all  Food Insecurity: No Food Insecurity (04/26/2017)   Hunger Vital Sign    Worried About Running Out of Food in the Last Year: Never true    Ran Out of Food in the Last Year: Never true  Transportation Needs: No Transportation Needs (04/26/2017)   PRAPARE - Administrator, Civil Service (Medical): No    Lack of Transportation (Non-Medical): No  Physical Activity: Insufficiently Active (04/26/2017)   Exercise Vital Sign    Days of Exercise per Week: 4 days    Minutes of Exercise per Session: 30 min  Stress: No Stress Concern Present (04/26/2017)   [employer] of  Occupational Health - Occupational Stress Questionnaire    Feeling of Stress : Only a little  Social Connections: Somewhat Isolated (04/26/2017)   Social Connection and Isolation Panel [NHANES]    Frequency of Communication with Friends and Family: More than three times a week    Frequency of Social Gatherings with Friends and Family: More than three times a week    Attends Religious Services: More than 4 times per year    Active Member of [employer] or Organizations: No    Attends Engineer, structural: Never    Marital Status: Divorced    Tobacco Counseling Counseling given: Not Answered   Clinical Intake:  Pre-visit preparation completed: No  Pain : No/denies pain     BMI - recorded: 29.21 Nutritional Status: BMI 25 -29 Overweight Nutritional Risks: None Diabetes: No  How often do you need to have someone help you when you read instructions, pamphlets, or other written materials from your doctor or pharmacy?: 1 - Never What is the last grade level you completed in school?: 12 Grade  Interpreter Needed?: No      Activities of Daily Living    07/20/2022    1:54 PM  In your present state of health, do you have any difficulty performing the following activities:  Hearing? 0  Vision? 0  Difficulty concentrating or making decisions? 0  Walking or climbing stairs? 1  Comment unsteady gait  Dressing or bathing? 0  Doing errands, shopping? 1  Comment sister assist with driving  Preparing Food and eating ? N  Comment daughter assist  Using the Toilet? N  In the past six months, have you accidently leaked urine? N  Do you have problems with loss of bowel control? N  Managing your Medications? N  Managing your Finances? N  Housekeeping or managing your Housekeeping? N  Patient Care Team: Derron Pipkins, Donalee Citrin, NP as PCP - General (Family Medicine) Hanley Seamen Dustin Folks, MD as Referring Physician (Optometry) Ranee Gosselin, MD as Consulting Physician (Orthopedic  Surgery) Sallye Lat, MD as Consulting Physician (Ophthalmology) Meryl Dare, MD as Consulting Physician (Gastroenterology) Riki Altes, MD (Urology) Stechschulte, Hyman Hopes, MD as Consulting Physician (Surgery) Malachy Mood, MD as Consulting Physician (Hematology)  Indicate any recent Medical Services you may have received from other than [facility] providers in the past year (date may be approximate).     Assessment:   This is a routine wellness examination for [place].  Hearing/Vision screen Hearing Screening - Comments:: No hearing issues  Vision Screening - Comments:: Last eye exam less than 12 months ago, with Dr.Groat. Copy of eye exam requested.   Dietary issues and exercise activities discussed:     Goals Addressed   None    Depression Screen    07/04/2021   12:41 PM 06/28/2020    9:24 AM 01/11/2020   10:24 AM 09/07/2019   10:16 AM 06/09/2019    8:31 AM 04/10/2019    9:27 AM 03/10/2019    8:51 AM  PHQ 2/9 Scores  PHQ - 2 Score 0 0 0 0 0 0 0    Fall Risk    04/01/2022   12:51 PM 03/10/2022    1:28 PM 10/15/2021    9:28 AM 07/10/2021    2:20 PM 07/04/2021   12:40 PM  Fall Risk   Falls in the past year? 0 0 0 0 0  Number falls in past yr: 0 0 0 0 0  Injury with Fall? 0 0 0 0 0  Risk for fall due to : No Fall Risks No Fall Risks No Fall Risks No Fall Risks No Fall Risks  Follow up Falls evaluation completed Falls evaluation completed Falls evaluation completed Falls evaluation completed Falls evaluation completed    MEDICARE RISK AT HOME:   TIMED UP AND GO:  Was the test performed?  Yes  Length of time to ambulate 10 feet: 15  sec Gait slow and steady with assistive device    Cognitive Function:    07/20/2022    1:27 PM 04/26/2017    8:49 AM 02/03/2016    9:10 AM  MMSE - Mini Mental State Exam  Orientation to time 5 5 5   Orientation to Place 5 5 5   Registration 3 3 3   Attention/ Calculation 5 5 5   Recall 3 3 2   Language- name 2 objects 2 2 2    Language- repeat 1 1 1   Language- follow 3 step command 3 3 3   Language- read & follow direction 1 1 1   Write a sentence 1 1 1   Copy design 0 1 1  Total score 29 30 29         07/04/2021   12:42 PM 06/28/2020    9:28 AM 06/09/2019    8:32 AM 06/07/2018    2:22 PM 06/07/2018    2:17 PM  6CIT Screen  What Year? 0 points 0 points 0 points 0 points 0 points  What month? 0 points 0 points 0 points 0 points 0 points  What time? 0 points 0 points 0 points 0 points 0 points  Count back from 20 0 points 4 points 0 points 0 points 0 points  Months in reverse 0 points 0 points 0 points 0 points 0 points  Repeat phrase 0 points 6 points 6 points 0 points 0 points  Total Score  0 points 10 points 6 points 0 points 0 points    Immunizations Immunization History  Administered Date(s) Administered   Fluad Quad(high Dose 65+) 09/05/2018, 09/26/2020, 10/01/2021, 10/02/2021   Influenza, High Dose Seasonal PF 10/02/2017, 11/20/2019   Influenza,inj,Quad PF,6+ Mos 10/03/2012, 09/21/2014, 11/07/2015   Influenza,inj,quad, With Preservative 11/12/2016   Influenza-Unspecified 10/28/2013   PFIZER(Purple Top)SARS-COV-2 Vaccination 03/25/2019, 03/25/2019, 04/18/2019, 11/01/2019, 05/23/2020, 10/07/2020   Pneumococcal Conjugate-13 01/04/2014, 01/04/2014, 11/04/2016   Pneumococcal Polysaccharide-23 12/29/2012, 11/19/2017   Zoster Recombinant(Shingrix) 11/25/2016, 01/26/2017    TDAP status: Due, Education has been provided regarding the importance of this vaccine. Advised may receive this vaccine at local pharmacy or Health Dept. Aware to provide a copy of the vaccination record if obtained from local pharmacy or Health Dept. Verbalized acceptance and understanding.  Flu Vaccine status: Up to date  Pneumococcal vaccine status: Up to date  Covid-19 vaccine status: Information provided on how to obtain vaccines.   Qualifies for Shingles Vaccine? Yes   Zostavax completed No   Shingrix Completed?:  Yes  Screening Tests Health Maintenance  Topic Date Due   DTaP/Tdap/Td (1 - Tdap) Never done   COVID-19 Vaccine (7 - 2023-24 season) 09/12/2021   OPHTHALMOLOGY EXAM  07/03/2022   INFLUENZA VACCINE  08/13/2022   HEMOGLOBIN A1C  09/30/2022   Diabetic kidney evaluation - Urine ACR  10/02/2022   Colonoscopy  02/17/2023   FOOT EXAM  06/01/2023   Lung Cancer Screening  07/13/2023   Diabetic kidney evaluation - eGFR measurement  07/13/2023   Medicare Annual Wellness (AWV)  07/20/2023   Pneumonia Vaccine 15+ Years old  Completed   DEXA SCAN  Completed   Hepatitis C Screening  Completed   Zoster Vaccines- Shingrix  Completed   HPV VACCINES  Aged Out    Health Maintenance  Health Maintenance Due  Topic Date Due   DTaP/Tdap/Td (1 - Tdap) Never done   COVID-19 Vaccine (7 - 2023-24 season) 09/12/2021   OPHTHALMOLOGY EXAM  07/03/2022    Colorectal cancer screening: Type of screening: Colonoscopy. Completed 05/17/2018. Repeat every 5 years  Mammogram status: Completed 08/2021. Repeat every year  Bone Density status: Completed 09/08/2018. Results reflect: Bone density results: NORMAL. Repeat every 5 years.  Lung Cancer Screening: (Low Dose CT Chest recommended if Age 62-80 years, 20 pack-year currently smoking OR have quit w/in 15years.) does not qualify.   Lung Cancer Screening Referral: No   Additional Screening:  Hepatitis C Screening: does not qualify; Completed Yes  Vision Screening: Recommended annual ophthalmology exams for early detection of glaucoma and other disorders of the eye. Is the patient up to date with their annual eye exam?  Yes  Who is the provider or what is the name of the office in which the patient attends annual eye exams? Dr.Chris If pt is not established with a provider, would they like to be referred to a provider to establish care? No .   Dental Screening: Recommended annual dental exams for proper oral hygiene  Diabetic Foot Exam: Diabetic Foot Exam:  Completed N/A   Community Resource Referral / Chronic Care Management: CRR required this visit?  No   CCM required this visit?  No     Plan:     I have personally reviewed and noted the following in the patient's chart:   Medical and social history Use of alcohol, tobacco or illicit drugs  Current medications and supplements including opioid prescriptions. Patient is not currently taking opioid prescriptions. Functional ability and status Nutritional status  Physical activity Advanced directives List of other physicians Hospitalizations, surgeries, and ER visits in previous 12 months Vitals Screenings to include cognitive, depression, and falls Referrals and appointments  In addition, I have reviewed and discussed with patient certain preventive protocols, quality metrics, and best practice recommendations. A written personalized care plan for preventive services as well as general preventive health recommendations were provided to patient.     Caesar Bookman, NP   07/20/2022   After Visit Summary: (Pick Up) Due to this being a telephonic visit, with patients personalized plan was offered to patient and patient has requested to Pick up at office.Patient in office   Nurse Notes: Advised to get Tdap vaccine at the pharmacy.

## 2022-07-22 ENCOUNTER — Other Ambulatory Visit: Payer: Self-pay

## 2022-07-22 DIAGNOSIS — R933 Abnormal findings on diagnostic imaging of other parts of digestive tract: Secondary | ICD-10-CM

## 2022-07-29 ENCOUNTER — Ambulatory Visit (AMBULATORY_SURGERY_CENTER): Payer: Medicare Other | Admitting: Gastroenterology

## 2022-07-29 ENCOUNTER — Encounter: Payer: Self-pay | Admitting: Gastroenterology

## 2022-07-29 VITALS — BP 117/64 | HR 69 | Temp 97.1°F | Resp 15 | Ht 65.0 in | Wt 181.0 lb

## 2022-07-29 DIAGNOSIS — R933 Abnormal findings on diagnostic imaging of other parts of digestive tract: Secondary | ICD-10-CM | POA: Diagnosis not present

## 2022-07-29 DIAGNOSIS — K319 Disease of stomach and duodenum, unspecified: Secondary | ICD-10-CM | POA: Diagnosis not present

## 2022-07-29 DIAGNOSIS — C49A0 Gastrointestinal stromal tumor, unspecified site: Secondary | ICD-10-CM

## 2022-07-29 MED ORDER — SODIUM CHLORIDE 0.9 % IV SOLN: 500.0000 mL | Freq: Once | INTRAVENOUS | Status: DC

## 2022-07-29 NOTE — Progress Notes (Signed)
See 07/20/2022 H&P no changes

## 2022-07-29 NOTE — Progress Notes (Signed)
Uneventful propofol anesthetic. Report to pacu rn. Vss. Care resumed by rn.

## 2022-07-29 NOTE — Progress Notes (Signed)
 Called to room to assist during endoscopic procedure.  Patient ID and intended procedure confirmed with present staff. Received instructions for my participation in the procedure from the performing physician.  

## 2022-07-29 NOTE — Progress Notes (Signed)
 VS by DT  Pt's states no medical or surgical changes since previsit or office visit.  

## 2022-07-29 NOTE — Op Note (Signed)
[facility] Patient Name: Alison [name] Procedure Date: 07/29/2022 12:00 PM MRN: [id number] Endoscopist: Meryl Dare , MD, [phone] Age: 77 Referring MD:  Date of Birth: 02-18-1945 Gender: Female Account #: [id number] Procedure:                Upper GI endoscopy Indications:              Abnormal CT of the GI tract (stomach), history of                            GIST - S/P gastric wedge resection Medicines:                Monitored Anesthesia Care Procedure:                Pre-Anesthesia Assessment:                           - Prior to the procedure, a History and Physical                            was performed, and patient medications and                            allergies were reviewed. The patient's tolerance of                            previous anesthesia was also reviewed. The risks                            and benefits of the procedure and the sedation                            options and risks were discussed with the patient.                            All questions were answered, and informed consent                            was obtained. Prior Anticoagulants: The patient has                            taken no anticoagulant or antiplatelet agents. ASA                            Grade Assessment: III - A patient with severe                            systemic disease. After reviewing the risks and                            benefits, the patient was deemed in satisfactory                            condition to undergo the procedure.  After obtaining informed consent, the endoscope was                            passed under direct vision. Throughout the                            procedure, the patient's blood pressure, pulse, and                            oxygen saturations were monitored continuously. The                            Olympus Scope [id number] was introduced through the                            mouth,  and advanced to the second part of duodenum.                            The upper GI endoscopy was accomplished without                            difficulty. The patient tolerated the procedure                            well. Scope In: Scope Out: Findings:                 The examined esophagus was normal.                           Evidence of a previous surgical anastomosis was                            found in the greater curvature of the stomach. This                            was characterized by healthy appearing mucosa.                            Biopsies were taken with a cold forceps for                            histology. No nodule noted.                           The exam of the stomach was otherwise normal.                           The duodenal bulb and second portion of the                            duodenum were normal. Complications:            No immediate complications. Estimated Blood Loss:     Estimated blood loss was minimal. Impression:               -  Normal esophagus.                           - A previous surgical anastomosis was found,                            characterized by healthy appearing mucosa. Biopsied.                           - Normal duodenal bulb and second portion of the                            duodenum. Recommendation:           - Patient has a contact number available for                            emergencies. The signs and symptoms of potential                            delayed complications were discussed with the                            patient. Return to normal activities tomorrow.                            Written discharge instructions were provided to the                            patient.                           - Resume previous diet.                           - Continue present medications.                           - Await pathology results.                           - Follow up with Dr. Mosetta Putt as  recommended. Meryl Dare, MD 07/29/2022 12:23:44 PM This report has been signed electronically.

## 2022-07-29 NOTE — Patient Instructions (Signed)
 YOU HAD AN ENDOSCOPIC PROCEDURE TODAY AT THE [facility]:   Refer to the procedure report that was given to you for any specific questions about what was found during the examination.  If the procedure report does not answer your questions, please call your gastroenterologist to clarify.  If you requested that your care partner not be given the details of your procedure findings, then the procedure report has been included in a sealed envelope for you to review at your convenience later.  YOU SHOULD EXPECT: Some feelings of bloating in the abdomen. Passage of more gas than usual.  Walking can help get rid of the air that was put into your GI tract during the procedure and reduce the bloating. If you had a lower endoscopy (such as a colonoscopy or flexible sigmoidoscopy) you may notice spotting of blood in your stool or on the toilet paper. If you underwent a bowel prep for your procedure, you may not have a normal bowel movement for a few days.  Please Note:  You might notice some irritation and congestion in your nose or some drainage.  This is from the oxygen used during your procedure.  There is no need for concern and it should clear up in a day or so.  SYMPTOMS TO REPORT IMMEDIATELY:  Following upper endoscopy (EGD)  Vomiting of blood or coffee ground material  New chest pain or pain under the shoulder blades  Painful or persistently difficult swallowing  New shortness of breath  Fever of 100F or higher  Black, tarry-looking stools  For urgent or emergent issues, a gastroenterologist can be reached at any hour by calling [phone]. Do not use MyChart messaging for urgent concerns.    DIET:  We do recommend a small meal at first, but then you may proceed to your regular diet.  Drink plenty of fluids but you should avoid alcoholic beverages for 24 hours.  ACTIVITY:  You should plan to take it easy for the rest of today and you should NOT DRIVE or use heavy machinery until  tomorrow (because of the sedation medicines used during the test).    FOLLOW UP: Our staff will call the number listed on your records the next business day following your procedure.  We will call around 7:15- 8:00 am to check on you and address any questions or concerns that you may have regarding the information given to you following your procedure. If we do not reach you, we will leave a message.     SIGNATURES/CONFIDENTIALITY: You and/or your care partner have signed paperwork which will be entered into your electronic medical record.  These signatures attest to the fact that that the information above on your After Visit Summary has been reviewed and is understood.  Full responsibility of the confidentiality of this discharge information lies with you and/or your care-partner. 

## 2022-07-30 ENCOUNTER — Telehealth: Payer: Self-pay

## 2022-07-30 NOTE — Telephone Encounter (Signed)
  Follow up Call-     07/29/2022   11:11 AM 12/03/2020    3:07 PM  Call back number  Post procedure Call Back phone  # [phone]  Permission to leave phone message Yes Yes     Patient questions:  Do you have a fever, pain , or abdominal swelling? No. Pain Score  0 *  Have you tolerated food without any problems? Yes.    Have you been able to return to your normal activities? Yes.    Do you have any questions about your discharge instructions: Diet   No. Medications  No. Follow up visit  No.  Do you have questions or concerns about your Care? No.  Actions: * If pain score is 4 or above: No action needed, pain <4.

## 2022-08-07 ENCOUNTER — Other Ambulatory Visit: Payer: Self-pay | Admitting: Family

## 2022-08-07 DIAGNOSIS — E1169 Type 2 diabetes mellitus with other specified complication: Secondary | ICD-10-CM

## 2022-08-10 ENCOUNTER — Encounter: Payer: Self-pay | Admitting: Gastroenterology

## 2022-08-29 ENCOUNTER — Other Ambulatory Visit: Payer: Self-pay | Admitting: Family

## 2022-08-29 DIAGNOSIS — D509 Iron deficiency anemia, unspecified: Secondary | ICD-10-CM

## 2022-09-01 DIAGNOSIS — Z1231 Encounter for screening mammogram for malignant neoplasm of breast: Secondary | ICD-10-CM | POA: Diagnosis not present

## 2022-09-01 LAB — HM MAMMOGRAPHY

## 2022-09-07 ENCOUNTER — Ambulatory Visit (INDEPENDENT_AMBULATORY_CARE_PROVIDER_SITE_OTHER): Payer: Medicare Other | Admitting: Podiatry

## 2022-09-07 ENCOUNTER — Encounter: Payer: Self-pay | Admitting: Podiatry

## 2022-09-07 DIAGNOSIS — M79674 Pain in right toe(s): Secondary | ICD-10-CM | POA: Diagnosis not present

## 2022-09-07 DIAGNOSIS — L84 Corns and callosities: Secondary | ICD-10-CM | POA: Diagnosis not present

## 2022-09-07 DIAGNOSIS — M79675 Pain in left toe(s): Secondary | ICD-10-CM

## 2022-09-07 DIAGNOSIS — B351 Tinea unguium: Secondary | ICD-10-CM | POA: Diagnosis not present

## 2022-09-07 DIAGNOSIS — E1142 Type 2 diabetes mellitus with diabetic polyneuropathy: Secondary | ICD-10-CM | POA: Diagnosis not present

## 2022-09-07 NOTE — Progress Notes (Signed)
  Subjective:  Patient ID: Alison [name], female    DOB: 1945/07/14,   MRN: [id number]  No chief complaint on file.   77 y.o. female presents for diabetic foot exam and  concern of thickened elongated and painful nails that are difficult to trim. Requesting to have them trimmed today. Relates burning and tingling in their feet. Patient is diabetic and last A1c was  Lab Results  Component Value Date   HGBA1C 5.5 03/30/2022   .   PCP:  Ngetich, Donalee Citrin, NP    . Denies any other pedal complaints. Denies n/v/f/c.   Past Medical History:  Diagnosis Date   Anxiety    Benign essential hypertension    Blood transfusion without reported diagnosis    Cataract    removed bilateraly   Diabetes mellitus without complication (HCC)    Diverticulitis of colon (without mention of hemorrhage)(562.11)    GERD (gastroesophageal reflux disease)    Goiter, specified as simple    Helicobacter pylori gastritis    Hyperlipidemia LDL goal < 100    Intrahepatic bile duct dilation    Numbness    TOES RT FOOT and toes left foot   Obesity, unspecified    Osteoarthrosis, unspecified whether generalized or localized, lower leg    Osteopenia    Other specified disease of nail    FUNGUS OF FINGERNAILS   Pernicious anemia    Unspecified glaucoma(365.9)    Unspecified vitamin D deficiency    Wears dentures    Wears glasses     Objective:  Physical Exam: Vascular: DP/PT pulses 2/4 bilateral. CFT <3 seconds. Absent hair growth on digits. Edema noted to bilateral lower extremities. Xerosis noted bilaterally.  Skin. No lacerations or abrasions bilateral feet. Nails 1-5 bilateral  are thickened discolored and elongated with subungual debris. Hyperkeratotic tissue noted sub fifth metatarsal head bilateral.  Musculoskeletal: MMT 5/5 bilateral lower extremities in DF, PF, Inversion and Eversion. Deceased ROM in DF of ankle joint.  Neurological: Sensation intact to light touch. Protective sensation diminished  bilateral.    Assessment:   1. Pain due to onychomycosis of toenails of both feet   2. Corns and callosities   3. Diabetic peripheral neuropathy associated with type 2 diabetes mellitus (HCC)       Plan:  Patient was evaluated and treated and all questions answered. -Discussed and educated patient on diabetic foot care, especially with  regards to the vascular, neurological and musculoskeletal systems.  -Stressed the importance of good glycemic control and the detriment of not  controlling glucose levels in relation to the foot. -Discussed supportive shoes at all times and checking feet regularly.  -Mechanically debrided all nails 1-5 bilateral using sterile nail nipper and filed with dremel without incident  -Hyperkeratotic tissue debrided without inicident.  -Answered all patient questions -Patient to return  in 4 months for at risk foot care -Patient advised to call the office if any problems or questions arise in the meantime.   Louann Sjogren, DPM

## 2022-09-08 ENCOUNTER — Other Ambulatory Visit: Payer: Self-pay | Admitting: Family

## 2022-09-08 ENCOUNTER — Other Ambulatory Visit: Payer: Medicare Other

## 2022-09-08 DIAGNOSIS — I10 Essential (primary) hypertension: Secondary | ICD-10-CM | POA: Diagnosis not present

## 2022-09-08 DIAGNOSIS — E1169 Type 2 diabetes mellitus with other specified complication: Secondary | ICD-10-CM | POA: Diagnosis not present

## 2022-09-08 DIAGNOSIS — E785 Hyperlipidemia, unspecified: Secondary | ICD-10-CM

## 2022-09-09 LAB — LIPID PANEL
Cholesterol: 101 mg/dL (ref <200)
HDL: 40 mg/dL — ABNORMAL LOW (ref >=50)
LDL Cholesterol (Calc): 47 mg/dL
Non-HDL Cholesterol (Calc): 61 mg/dL (calc) (ref <130)
Total CHOL/HDL Ratio: 2.5 (calc) (ref <5.0)
Triglycerides: 64 mg/dL (ref <150)

## 2022-09-09 LAB — TSH: TSH: 0.68 m[IU]/L (ref 0.40–4.50)

## 2022-09-11 ENCOUNTER — Ambulatory Visit: Payer: Medicare Other | Admitting: Family

## 2022-09-15 ENCOUNTER — Ambulatory Visit (INDEPENDENT_AMBULATORY_CARE_PROVIDER_SITE_OTHER): Payer: Medicare Other | Admitting: Family

## 2022-09-15 ENCOUNTER — Other Ambulatory Visit: Payer: Self-pay

## 2022-09-15 ENCOUNTER — Encounter: Payer: Self-pay | Admitting: Family

## 2022-09-15 VITALS — BP 138/68 | HR 65 | Temp 97.2°F | Resp 18 | Ht 66.0 in | Wt 179.0 lb

## 2022-09-15 DIAGNOSIS — D509 Iron deficiency anemia, unspecified: Secondary | ICD-10-CM | POA: Diagnosis not present

## 2022-09-15 DIAGNOSIS — Z23 Encounter for immunization: Secondary | ICD-10-CM

## 2022-09-15 DIAGNOSIS — I700 Atherosclerosis of aorta: Secondary | ICD-10-CM

## 2022-09-15 DIAGNOSIS — E785 Hyperlipidemia, unspecified: Secondary | ICD-10-CM

## 2022-09-15 DIAGNOSIS — H6123 Impacted cerumen, bilateral: Secondary | ICD-10-CM | POA: Diagnosis not present

## 2022-09-15 DIAGNOSIS — E1149 Type 2 diabetes mellitus with other diabetic neurological complication: Secondary | ICD-10-CM

## 2022-09-15 DIAGNOSIS — N1831 Chronic kidney disease, stage 3a: Secondary | ICD-10-CM | POA: Diagnosis not present

## 2022-09-15 DIAGNOSIS — K219 Gastro-esophageal reflux disease without esophagitis: Secondary | ICD-10-CM

## 2022-09-15 DIAGNOSIS — R319 Hematuria, unspecified: Secondary | ICD-10-CM | POA: Diagnosis not present

## 2022-09-15 DIAGNOSIS — C49A0 Gastrointestinal stromal tumor, unspecified site: Secondary | ICD-10-CM

## 2022-09-15 MED ORDER — IMATINIB MESYLATE 100 MG PO TABS: 300.0000 mg | ORAL_TABLET | Freq: Every day | ORAL | 2 refills | Status: DC

## 2022-09-15 NOTE — Progress Notes (Signed)
Provider: Richarda Blade FNP-C   Cynthea Zachman, Donalee Citrin, NP  Patient Care Team: Blayze Haen, Donalee Citrin, NP as PCP - General (Family Medicine) Hanley Seamen Dustin Folks, MD as Referring Physician (Optometry) Ranee Gosselin, MD as Consulting Physician (Orthopedic Surgery) Sallye Lat, MD as Consulting Physician (Ophthalmology) Meryl Dare, MD as Consulting Physician (Gastroenterology) Riki Altes, MD (Urology) Stechschulte, Hyman Hopes, MD as Consulting Physician (Surgery) Malachy Mood, MD as Consulting Physician (Hematology)  Extended Emergency Contact Information Primary Emergency Contact: Hoare,[name] Home Phone: [phone] Mobile Phone: [phone] Relation: Daughter Secondary Emergency Contact: [place], [place] [address] D[place] of [place] Home Phone: [phone] Mobile Phone: [phone] Relation: Sister  Code Status:  Full Code  Goals of care: Advanced Directive information    09/15/2022    8:50 AM  Advanced Directives  Does Patient Have a Medical Advance Directive? No     Chief Complaint  Patient presents with   Follow-up    6 month follow up. [facility] reviewed. Saw Dr. Dione Booze in May - records requested. Interested in flu vaccine. She is getting COVID shot at pharmacy.     HPI:  Pt is a 77 y.o. female seen today for 6 months follow up for medical management of chronic diseases.  States still sees the blood in the urine.usually in the early mornings but every time.not as bad as it was.sometimes.Has frequency.she denies any fever,chills,nausea,vomiting,abdominal pain,flank pain,urgency,frequency,dysuria or difficult urination. Has CT scan of Abd in oct,2024.   Type 2 DM - states feels hungry all time.eats 2 meals per day and a sandwiched.Eats bananas in the evening. Had eggs,waffles, latest A1C 5.5  Feels legs are cold all the time not numb.  Follow up with podiatrist every 4 months seen one week ago  She was also seen by ophthalmologist in  [place],1601 has been scheduled for follow up in oct,2024.   Hypertension - does not check B/p at home.she denies any headache,dizziness,vision changes,fatigue,chest tightness,palpitation,chest pain or shortness of breath.     Hyperlipidemia - latest LDL normal  GERD - still requires Protonix for reflux.  CKG stage 3 - CR 1.07 and GFR 53 stable  Diarrhea - imodium has been effective.No blood in the stool.   Internal Hemorrhoids - suppositories effective.    Past Medical History:  Diagnosis Date   Anxiety    Benign essential hypertension    Blood transfusion without reported diagnosis    Cataract    removed bilateraly   Diabetes mellitus without complication (HCC)    Diverticulitis of colon (without mention of hemorrhage)(562.11)    GERD (gastroesophageal reflux disease)    Goiter, specified as simple    Helicobacter pylori gastritis    Hyperlipidemia LDL goal < 100    Intrahepatic bile duct dilation    Numbness    TOES RT FOOT and toes left foot   Obesity, unspecified    Osteoarthrosis, unspecified whether generalized or localized, lower leg    Osteopenia    Other specified disease of nail    FUNGUS OF FINGERNAILS   Pernicious anemia    Unspecified glaucoma(365.9)    Unspecified vitamin D deficiency    Wears dentures    Wears glasses    Past Surgical History:  Procedure Laterality Date   BACK SURGERY     CATARACT EXTRACTION     right eye 06/20/15 left eye 09/12/15   COLECTOMY  2009   COLONOSCOPY  09/06/2007   Dr Jeani Hawking   DILATION AND  CURETTAGE OF UTERUS     ENDOSCOPIC RETROGRADE CHOLANGIOPANCREATOGRAPHY (ERCP) WITH PROPOFOL N/A 03/17/2019   Procedure: ENDOSCOPIC RETROGRADE CHOLANGIOPANCREATOGRAPHY (ERCP) WITH PROPOFOL;  Surgeon: Meryl Dare, MD;  Location: [facility] ENDOSCOPY;  Service: Endoscopy;  Laterality: N/A;   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   JOINT REPLACEMENT     right   LUMBAR LAMINECTOMY/DECOMPRESSION MICRODISCECTOMY N/A 03/30/2013    Procedure: CENTRAL DECOMPRESSION L4 - L5 AND EXCISION 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;  Surgeon: Jacki Cones, MD;  Location: [facility] ORS;  Service: Orthopedics;  Laterality: N/A;   MULTIPLE TOOTH EXTRACTIONS     REMOVAL OF STONES  03/17/2019   Procedure: REMOVAL OF STONES;  Surgeon: Meryl Dare, MD;  Location: [facility] ENDOSCOPY;  Service: Endoscopy;;   SIGMOIDOSCOPY  02/16/2018   Dr Russella Dar   SPHINCTEROTOMY  03/17/2019   Procedure: SPHINCTEROTOMY;  Surgeon: Meryl Dare, MD;  Location: [facility] ENDOSCOPY;  Service: Endoscopy;;   TOTAL KNEE ARTHROPLASTY Right 02/01/2013   Procedure: RIGHT TOTAL KNEE ARTHROPLASTY;  Surgeon: Jacki Cones, MD;  Location: [facility] ORS;  Service: Orthopedics;  Laterality: Right;   TOTAL KNEE ARTHROPLASTY Left 04/18/2014   Procedure: LEFT TOTAL KNEE ARTHROPLASTY;  Surgeon: Ranee Gosselin, MD;  Location: [facility] ORS;  Service: Orthopedics;  Laterality: Left;    Allergies  Allergen Reactions   Aspirin Nausea Only and Nausea And Vomiting    Can take 81 mg but can't take 325mg , hard on stomach. Can take 81 mg but can't take 325mg , hard on stomach.    Allergies as of 09/15/2022       Reactions   Aspirin Nausea Only, Nausea And Vomiting   Can take 81 mg but can't take 325mg , hard on stomach. Can take 81 mg but can't take 325mg , hard on stomach.        Medication List        Accurate as of September 15, 2022 11:59 PM. If you have any questions, ask your nurse or doctor.          acetaminophen 650 MG CR tablet Commonly known as: TYLENOL Take 1,300 mg by mouth as needed.   amLODipine 10 MG tablet Commonly known as: NORVASC Take 1 tablet by mouth once daily   atorvastatin 40 MG tablet Commonly known as: LIPITOR TAKE 1 TABLET BY MOUTH ONCE DAILY AT 6 PM   brimonidine 0.2 % ophthalmic solution Commonly known as: ALPHAGAN Place 1 drop into both eyes 2 (two) times daily.   CALCIUM 600 + D PO Take 1 tablet by mouth in the morning.   carboxymethylcellulose 0.5  % Soln Commonly known as: REFRESH PLUS Place 1 drop into both eyes 3 (three) times daily as needed (dry eyes).   DESITIN EX Apply 1 application topically as needed.   dorzolamide-timolol 2-0.5 % ophthalmic solution Commonly known as: COSOPT Place 1 drop into both eyes 2 (two) times daily.   gabapentin 300 MG capsule Commonly known as: NEURONTIN Take 1 capsule by mouth at bedtime   imatinib 100 MG tablet Commonly known as: GLEEVEC Take 3 tablets (300 mg total) by mouth daily. Take with meals and large glass of water.Caution:Chemotherapy   Iron 325 (65 Fe) MG Tabs Take 1 tablet by mouth once daily   lisinopril 10 MG tablet Commonly known as: ZESTRIL Take 1 tablet by mouth once daily   loperamide 2 MG tablet Commonly known as: IMODIUM A-D Take 1 mg by mouth daily as needed.   multivitamin with minerals Tabs tablet Take 1  tablet by mouth daily.   nystatin cream Commonly known as: MYCOSTATIN Apply 1 Application topically 2 (two) times daily.   OMEGA 3 PO Take 1,200 mg by mouth in the morning.   ondansetron 8 MG tablet Commonly known as: ZOFRAN Take 1 tablet (8 mg total) by mouth every 8 (eight) hours as needed for nausea or vomiting.   pantoprazole 40 MG tablet Commonly known as: PROTONIX Take 1 tablet (40 mg total) by mouth daily.   Rocklatan 0.02-0.005 % Soln Generic drug: Netarsudil-Latanoprost Apply 1 drop to eye at bedtime.   Vitamin D-3 25 MCG (1000 UT) Caps Take 1,000 Units by mouth in the morning.   Voltaren 1 % Gel Generic drug: diclofenac Sodium Apply 2 g topically as needed.        Review of Systems  Constitutional:  Negative for appetite change, chills, fatigue, fever and unexpected weight change.  HENT:  Negative for congestion, dental problem, ear discharge, ear pain, facial swelling, hearing loss, nosebleeds, postnasal drip, rhinorrhea, sinus pressure, sinus pain, sneezing, sore throat, tinnitus and trouble swallowing.   Eyes:  Negative for  pain, discharge, redness, itching and visual disturbance.  Respiratory:  Negative for cough, chest tightness, shortness of breath and wheezing.   Cardiovascular:  Negative for chest pain, palpitations and leg swelling.  Gastrointestinal:  Negative for abdominal distention, abdominal pain, blood in stool, constipation, diarrhea, nausea and vomiting.  Endocrine: Negative for cold intolerance, heat intolerance, polydipsia, polyphagia and polyuria.  Genitourinary:  Positive for frequency and hematuria. Negative for difficulty urinating, dysuria, flank pain and urgency.  Musculoskeletal:  Positive for arthralgias and gait problem. Negative for back pain, joint swelling, myalgias, neck pain and neck stiffness.       Voltaren gel effective   Skin:  Negative for color change, pallor, rash and wound.  Neurological:  Negative for dizziness, syncope, speech difficulty, weakness, light-headedness, numbness and headaches.  Hematological:  Does not bruise/bleed easily.  Psychiatric/Behavioral:  Negative for agitation, behavioral problems, confusion, hallucinations and sleep disturbance. The patient is not nervous/anxious.     Immunization History  Administered Date(s) Administered   Fluad Quad(high Dose 65+) 09/05/2018, 09/26/2020, 10/01/2021, 10/02/2021   Fluad Trivalent(High Dose 65+) 09/15/2022   Influenza, High Dose Seasonal PF 10/02/2017, 11/20/2019   Influenza,inj,Quad PF,6+ Mos 10/03/2012, 09/21/2014, 11/07/2015   Influenza,inj,quad, With Preservative 11/12/2016   Influenza-Unspecified 10/28/2013   PFIZER(Purple Top)SARS-COV-2 Vaccination 03/25/2019, 03/25/2019, 04/18/2019, 11/01/2019, 05/23/2020, 10/07/2020   Pneumococcal Conjugate-13 01/04/2014, 01/04/2014, 11/04/2016   Pneumococcal Polysaccharide-23 12/29/2012, 11/19/2017   Respiratory Syncytial Virus Vaccine,Recomb Aduvanted(Arexvy) 08/07/2022   Zoster Recombinant(Shingrix) 11/25/2016, 01/26/2017   Pertinent  Health Maintenance Due  Topic  Date Due   HEMOGLOBIN A1C  09/30/2022   Colonoscopy  02/17/2023   OPHTHALMOLOGY EXAM  05/13/2023   FOOT EXAM  06/01/2023   INFLUENZA VACCINE  Completed   DEXA SCAN  Completed      01/13/2022    3:03 PM 01/14/2022   11:46 AM 03/10/2022    1:28 PM 04/01/2022   12:51 PM 09/15/2022    8:50 AM  Fall Risk  Falls in the past year?   0 0 0  Was there an injury with Fall?   0 0   Fall Risk Category Calculator   0 0   (RETIRED) Patient Fall Risk Level Low fall risk Low fall risk     Patient at Risk for Falls Due to   No Fall Risks No Fall Risks   Fall risk Follow up   Falls evaluation completed  Falls evaluation completed    Functional Status Survey:    Vitals:   09/15/22 0853  BP: 138/68  Pulse: 65  Resp: 18  Temp: (!) 97.2 F (36.2 C)  SpO2: 99%  Weight: 179 lb (81.2 kg)  Height: 5\' 6"  (1.676 m)   Body mass index is 28.89 kg/m. Physical Exam Vitals reviewed.  Constitutional:      General: She is not in acute distress.    Appearance: Normal appearance. She is overweight. She is not ill-appearing or diaphoretic.  HENT:     Head: Normocephalic.     Right Ear: Tympanic membrane, ear canal and external ear normal. There is no impacted cerumen.     Left Ear: Tympanic membrane, ear canal and external ear normal. There is no impacted cerumen.     Nose: Nose normal. No congestion or rhinorrhea.     Mouth/Throat:     Mouth: Mucous membranes are moist.     Pharynx: Oropharynx is clear. No oropharyngeal exudate or posterior oropharyngeal erythema.  Eyes:     General: No scleral icterus.       Right eye: No discharge.        Left eye: No discharge.     Extraocular Movements: Extraocular movements intact.     Conjunctiva/sclera: Conjunctivae normal.     Pupils: Pupils are equal, round, and reactive to light.  Neck:     Vascular: No carotid bruit.  Cardiovascular:     Rate and Rhythm: Normal rate and regular rhythm.     Pulses: Normal pulses.     Heart sounds: Normal heart sounds. No  murmur heard.    No friction rub. No gallop.  Pulmonary:     Effort: Pulmonary effort is normal. No respiratory distress.     Breath sounds: Normal breath sounds. No wheezing, rhonchi or rales.  Chest:     Chest wall: No tenderness.  Abdominal:     General: Bowel sounds are normal. There is no distension.     Palpations: Abdomen is soft. There is no mass.     Tenderness: There is no abdominal tenderness. There is no right CVA tenderness, left CVA tenderness, guarding or rebound.  Genitourinary:    Comments: Decline rectal exam to exam internal hemorrhoids since Preparation -H supp. effective  Musculoskeletal:        General: No swelling or tenderness. Normal range of motion.     Cervical back: Normal range of motion. No rigidity or tenderness.     Right lower leg: No edema.     Left lower leg: No edema.  Lymphadenopathy:     Cervical: No cervical adenopathy.  Skin:    General: Skin is warm and dry.     Coloration: Skin is not pale.     Findings: No bruising, erythema, lesion or rash.  Neurological:     Mental Status: She is alert and oriented to person, place, and time.     Cranial Nerves: No cranial nerve deficit.     Sensory: No sensory deficit.     Motor: No weakness.     Coordination: Coordination normal.     Gait: Gait abnormal.  Psychiatric:        Mood and Affect: Mood normal.        Speech: Speech normal.        Behavior: Behavior normal.        Thought Content: Thought content normal.        Judgment: Judgment normal.  Labs reviewed: Recent Labs    03/23/22 1349 03/30/22 0841 07/13/22 0951  NA 136 140 139  K 3.8 4.2 4.1  CL 105 108 108  CO2 27 25 28   GLUCOSE 98 95 87  BUN 13 13 13   CREATININE 1.06* 1.07* 1.07*  CALCIUM 9.0 8.8 8.7*   Recent Labs    01/01/22 1331 03/23/22 1349 03/30/22 0841 07/13/22 0951  AST 21 20 18 19   ALT 12 13 12 12   ALKPHOS 61 62  --  47  BILITOT 0.5 0.5 0.5 0.5  PROT 7.4 7.2 6.8 6.8  ALBUMIN 4.3 4.2  --  3.8    Recent Labs    03/30/22 0841 05/25/22 0949 07/13/22 0951  WBC 5.4 7.1 7.0  NEUTROABS 3,413 4,658 4.4  HGB 10.9* 10.7* 10.4*  HCT 33.6* 33.7* 31.6*  MCV 94.9 96.0 95.8  PLT 302 269 277   Lab Results  Component Value Date   TSH 0.68 09/08/2022   Lab Results  Component Value Date   HGBA1C 5.5 03/30/2022   Lab Results  Component Value Date   CHOL 101 09/08/2022   HDL 40 (L) 09/08/2022   LDLCALC 47 09/08/2022   TRIG 64 09/08/2022   CHOLHDL 2.5 09/08/2022    Significant Diagnostic Results in last 30 days:  No results found.  Assessment/Plan 1. Need for influenza vaccination Flut shot administered by Jannifer Rodney ,CMA no acute reaction reported.  - Flu Vaccine Trivalent High Dose (Fluad)  2. Type 2 diabetes mellitus with neurological complications [facility]) Lab Results  Component Value Date   HGBA1C 5.5 03/30/2022  -Controlled -Annual eye and foot exam up-to-date advised to continue to follow-up with ophthalmology and podiatrist - continue on  Statin for cardiac event prophylaxis - on ACE inhibitor for renal protection  - TSH; Future - COMPLETE METABOLIC PANEL WITH GFR; Future - CBC with Differential/Platelet; Future - Hemoglobin A1c; Future  3. Stage 3a chronic kidney disease (HCC) CR 1.07 at baseline  - Will continue to avoid Nephrotoxins and dose all other medication for renal clearance  - COMPLETE METABOLIC PANEL WITH GFR; Future  4. Iron deficiency anemia, unspecified iron deficiency anemia type Hgb 10.4 stable  -Continue ferrous sulfate - CBC with Differential/Platelet; Future  5. Gastroesophageal reflux disease without esophagitis Symptoms controlled. H/H stable.No tarry or black stool  - advised to avoid eating meals late in the evening and to avoid aggravating foods and spices. - continue on Protonix - COMPLETE METABOLIC PANEL WITH GFR; Future - CBC with Differential/Platelet; Future  6. Hyperlipidemia LDL goal <100 LDL at goal Continue  atorvastatin -Dietary modification and exercise advised - Lipid panel; Future  7. Atherosclerosis of aorta (HCC) Continue on atorvastatin control high risk factors  8. Hematuria, unspecified type Chronic but has slightly improved. Will recheck for urinary tract infection. -Continue to follow-up with urologist - Culture, Urine  9. Bilateral impacted cerumen Bilateral ear cerumen lavaged with warm water and hydrogen peroxide moderate amounts of cerumen obtained.Tolerated procedure well.no instrument used TM clear without any signs of infection. -Advised to notify provider for any signs of infection.  Family/ staff Communication: Reviewed plan of care with patient verbalized understanding  Labs/tests ordered:  - Culture, Urine - Lipid panel; Future - TSH; Future - COMPLETE METABOLIC PANEL WITH GFR; Future - CBC with Differential/Platelet; Future - Hemoglobin A1c; Future  Next Appointment : Return in about 6 months (around 03/15/2023) for medical mangement of chronic issues., Fasting labs in 6 months prior to visit.   Salisha Bardsley  Morrell Riddle, NP

## 2022-09-16 LAB — URINE CULTURE
MICRO NUMBER:: 15414179
Result:: NO GROWTH
SPECIMEN QUALITY:: ADEQUATE

## 2022-09-22 ENCOUNTER — Other Ambulatory Visit: Payer: Self-pay | Admitting: Family

## 2022-09-22 DIAGNOSIS — E1149 Type 2 diabetes mellitus with other diabetic neurological complication: Secondary | ICD-10-CM

## 2022-10-14 ENCOUNTER — Ambulatory Visit (HOSPITAL_COMMUNITY)
Admission: RE | Admit: 2022-10-14 | Discharge: 2022-10-14 | Disposition: A | Discharge status: Home / Self Care | Payer: Medicare Other | Source: Ambulatory Visit | Attending: Hematology | Admitting: Hematology

## 2022-10-14 ENCOUNTER — Inpatient Hospital Stay: Payer: Medicare Other | Attending: Hematology

## 2022-10-14 DIAGNOSIS — K8020 Calculus of gallbladder without cholecystitis without obstruction: Secondary | ICD-10-CM | POA: Diagnosis not present

## 2022-10-14 DIAGNOSIS — C49A0 Gastrointestinal stromal tumor, unspecified site: Secondary | ICD-10-CM | POA: Insufficient documentation

## 2022-10-14 DIAGNOSIS — I700 Atherosclerosis of aorta: Secondary | ICD-10-CM | POA: Diagnosis not present

## 2022-10-14 DIAGNOSIS — R319 Hematuria, unspecified: Secondary | ICD-10-CM | POA: Insufficient documentation

## 2022-10-14 DIAGNOSIS — D259 Leiomyoma of uterus, unspecified: Secondary | ICD-10-CM | POA: Diagnosis not present

## 2022-10-14 DIAGNOSIS — Z79899 Other long term (current) drug therapy: Secondary | ICD-10-CM | POA: Diagnosis not present

## 2022-10-14 DIAGNOSIS — C49A2 Gastrointestinal stromal tumor of stomach: Secondary | ICD-10-CM | POA: Insufficient documentation

## 2022-10-14 LAB — CBC WITH DIFFERENTIAL (CANCER CENTER ONLY)
Abs Immature Granulocytes: 0.01 10*3/uL (ref 0.00–0.07)
Basophils Absolute: 0 10*3/uL (ref 0.0–0.1)
Basophils Relative: 1 %
Eosinophils Absolute: 0.1 10*3/uL (ref 0.0–0.5)
Eosinophils Relative: 2 %
HCT: 29.8 % — ABNORMAL LOW (ref 36.0–46.0)
Hemoglobin: 9.8 g/dL — ABNORMAL LOW (ref 12.0–15.0)
Immature Granulocytes: 0 %
Lymphocytes Relative: 25 %
Lymphs Abs: 1.7 10*3/uL (ref 0.7–4.0)
MCH: 31.5 pg (ref 26.0–34.0)
MCHC: 32.9 g/dL (ref 30.0–36.0)
MCV: 95.8 fL (ref 80.0–100.0)
Monocytes Absolute: 0.5 10*3/uL (ref 0.1–1.0)
Monocytes Relative: 8 %
Neutro Abs: 4.3 10*3/uL (ref 1.7–7.7)
Neutrophils Relative %: 64 %
Platelet Count: 252 10*3/uL (ref 150–400)
RBC: 3.11 MIL/uL — ABNORMAL LOW (ref 3.87–5.11)
RDW: 13.6 % (ref 11.5–15.5)
WBC Count: 6.6 10*3/uL (ref 4.0–10.5)
nRBC: 0 % (ref 0.0–0.2)

## 2022-10-14 LAB — CMP (CANCER CENTER ONLY)
ALT: 14 U/L (ref 0–44)
AST: 21 U/L (ref 15–41)
Albumin: 3.8 g/dL (ref 3.5–5.0)
Alkaline Phosphatase: 64 U/L (ref 38–126)
Anion gap: 3 — ABNORMAL LOW (ref 5–15)
BUN: 13 mg/dL (ref 8–23)
CO2: 27 mmol/L (ref 22–32)
Calcium: 8.9 mg/dL (ref 8.9–10.3)
Chloride: 107 mmol/L (ref 98–111)
Creatinine: 1.04 mg/dL — ABNORMAL HIGH (ref 0.44–1.00)
GFR, Estimated: 55 mL/min — ABNORMAL LOW (ref >60)
Glucose, Bld: 109 mg/dL — ABNORMAL HIGH (ref 70–99)
Potassium: 3.9 mmol/L (ref 3.5–5.1)
Sodium: 137 mmol/L (ref 135–145)
Total Bilirubin: 0.5 mg/dL (ref 0.3–1.2)
Total Protein: 6.7 g/dL (ref 6.5–8.1)

## 2022-10-14 MED ORDER — IOHEXOL 300 MG/ML  SOLN
100.0000 mL | Freq: Once | INTRAMUSCULAR | Status: AC | PRN
  Administered 2022-10-14: 100 mL via INTRAVENOUS

## 2022-10-21 ENCOUNTER — Inpatient Hospital Stay (HOSPITAL_BASED_OUTPATIENT_CLINIC_OR_DEPARTMENT_OTHER): Payer: Medicare Other | Admitting: Hematology

## 2022-10-21 ENCOUNTER — Encounter: Payer: Self-pay | Admitting: Hematology

## 2022-10-21 VITALS — BP 147/74 | HR 75 | Temp 97.6°F | Resp 18 | Ht 66.0 in | Wt 180.5 lb

## 2022-10-21 DIAGNOSIS — R319 Hematuria, unspecified: Secondary | ICD-10-CM

## 2022-10-21 DIAGNOSIS — C49A0 Gastrointestinal stromal tumor, unspecified site: Secondary | ICD-10-CM | POA: Diagnosis not present

## 2022-10-21 DIAGNOSIS — C49A2 Gastrointestinal stromal tumor of stomach: Secondary | ICD-10-CM | POA: Diagnosis not present

## 2022-10-21 DIAGNOSIS — Z79899 Other long term (current) drug therapy: Secondary | ICD-10-CM | POA: Diagnosis not present

## 2022-10-21 MED ORDER — IMATINIB MESYLATE 100 MG PO TABS: 300.0000 mg | ORAL_TABLET | Freq: Every day | ORAL | 2 refills | Status: DC

## 2022-10-21 NOTE — Progress Notes (Signed)
[facility]   [phone] Fax:([phone]   Clinic Follow up Note   Patient Care Team: Ngetich, Donalee Citrin, NP as PCP - General (Family Medicine) Hanley Seamen, Dustin Folks, MD as Referring Physician (Optometry) Ranee Gosselin, MD as Consulting Physician (Orthopedic Surgery) Sallye Lat, MD as Consulting Physician (Ophthalmology) Meryl Dare, MD as Consulting Physician (Gastroenterology) Riki Altes, MD (Urology) Stechschulte, Hyman Hopes, MD as Consulting Physician (Surgery) Malachy Mood, MD as Consulting Physician (Hematology)  Date of Service:  10/21/2022  CHIEF COMPLAINT: f/u of GIST  CURRENT THERAPY:  Gleevec 300 mg daily  Oncology History   Gastrointestinal stromal tumor (GIST) (HCC) pT4NxM0, G2, 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 S/p partial gastrectomy 01/14/21 by Dr. 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 No lymph nodes were evaluated.  -she has high risk of recurrence (86%) due to the large size and high grade.  -staging chest CT 02/17/21 negative for metastatic disease. 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 She is tolerating well so far with mild to moderate diarrhea, improved with Imodium. She developed worsening epigastric pain, and Gleevec was held for a month. -she restarted Gleevec at 100 mg on 05/22/21 and gradually increased to 300 mg daily in June, she is tolerating well so far  -restaging CT CAP from 12/31/2021 showed no evidence of recurrence.  Previous nodular area adjacent to the suture margin has resolved. 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 -She is clinically doing well, tolerating imatinib well, no clinical concern for recurrence. -Surveillance CT scan on October 14 2022 was  negative for disease recurrence, except a 6 mm nodule at the suture line of her stomach.  She had EGD in July 2024 which was negative     Assessment and Plan    Hematuria Intermittent blood in urine since January. Previous urology evaluation with cystoscopy and imaging were negative. Currently asymptomatic. -Provide urine cup for patient to collect sample at home during next episode of visible hematuria. -Advise patient to bring sample to clinic on a weekday for testing.  Gastrointestinal Stromal Tumor (GIST) No signs of recurrence on recent CT scan. Small nodule in stomach where surgery was performed, but recent endoscopy with biopsy was negative. Currently on Gleevec with good tolerance. -Continue Gleevec for a total of three years, to be completed in 2026. -Plan for repeat imaging in six months. -Schedule follow-up visit in three months with nurse practitioner who will order the scan.  Gleevec side effects Reports of gas, but otherwise well-tolerated. -Continue current regimen.  General Health Maintenance -Confirmed patient has received COVID-19 vaccine, flu shot, and RSV vaccine. -Schedule follow-up visit in three months.      PLAN -Lab and CT scan reviewed, no evidence of recurrence -Continue Gleevec 300 mg daily, she is tolerating well -Lab and follow-up in 3 months with NP, plan to repeat a CT scan in 6 months   Discussed the use of AI scribe software for clinical note transcription with the patient, who gave verbal consent to proceed.  History of Present Illness   The patient, a 77 year old female with a history of cancer, presents for a routine follow-up. She reports intermittent hematuria since January, which has been evaluated by a urologist. Despite a negative cystoscopy and CT scan, the patient continues to experience  episodes of blood in the urine. The patient denies any associated pain or discomfort. She also reports a history of aspirin use, which she has since  discontinued.  The patient is currently on Gleevec for cancer treatment, which she has been taking for over a year. She reports occasional nausea, which is managed by eating small meals. She also experiences increased gas, a known side effect of the medication. The patient is scheduled to continue Gleevec for a total of three years, with an expected completion date in 2026.  Additionally, the patient mentions a small nodule in the stomach, where she previously had surgery. However, a recent endoscopy with biopsy was negative, and there are no signs of cancer recurrence.         All other systems were reviewed with the patient and are negative.  MEDICAL HISTORY:  Past Medical History:  Diagnosis Date   Anxiety    Benign essential hypertension    Blood transfusion without reported diagnosis    Cataract    removed bilateraly   Diabetes mellitus without complication (HCC)    Diverticulitis of colon (without mention of hemorrhage)(562.11)    GERD (gastroesophageal reflux disease)    Goiter, specified as simple    Helicobacter pylori gastritis    Hyperlipidemia LDL goal < 100    Intrahepatic bile duct dilation    Numbness    TOES RT FOOT and toes left foot   Obesity, unspecified    Osteoarthrosis, unspecified whether generalized or localized, lower leg    Osteopenia    Other specified disease of nail    FUNGUS OF FINGERNAILS   Pernicious anemia    Unspecified glaucoma(365.9)    Unspecified vitamin D deficiency    Wears dentures    Wears glasses     SURGICAL HISTORY: Past Surgical History:  Procedure Laterality Date   BACK SURGERY     CATARACT EXTRACTION     right eye 06/20/15 left eye 09/12/15   COLECTOMY  2009   COLONOSCOPY  09/06/2007   Dr Jeani Hawking   DILATION AND CURETTAGE OF UTERUS     ENDOSCOPIC RETROGRADE CHOLANGIOPANCREATOGRAPHY (ERCP) WITH PROPOFOL N/A 03/17/2019   Procedure: ENDOSCOPIC RETROGRADE CHOLANGIOPANCREATOGRAPHY (ERCP) WITH PROPOFOL;  Surgeon: Meryl Dare, MD;  Location: [facility] ENDOSCOPY;  Service: Endoscopy;  Laterality: N/A;   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   JOINT REPLACEMENT     right   LUMBAR LAMINECTOMY/DECOMPRESSION MICRODISCECTOMY N/A 03/30/2013   Procedure: CENTRAL DECOMPRESSION L4 - L5 AND EXCISION 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;  Surgeon: Jacki Cones, MD;  Location: [facility] ORS;  Service: Orthopedics;  Laterality: N/A;   MULTIPLE TOOTH EXTRACTIONS     REMOVAL OF STONES  03/17/2019   Procedure: REMOVAL OF STONES;  Surgeon: Meryl Dare, MD;  Location: [facility] ENDOSCOPY;  Service: Endoscopy;;   SIGMOIDOSCOPY  02/16/2018   Dr Russella Dar   SPHINCTEROTOMY  03/17/2019   Procedure: SPHINCTEROTOMY;  Surgeon: Meryl Dare, MD;  Location: [facility] ENDOSCOPY;  Service: Endoscopy;;   TOTAL KNEE ARTHROPLASTY Right 02/01/2013   Procedure: RIGHT TOTAL KNEE ARTHROPLASTY;  Surgeon: Jacki Cones, MD;  Location: [facility] ORS;  Service: Orthopedics;  Laterality: Right;   TOTAL KNEE ARTHROPLASTY Left 04/18/2014   Procedure: LEFT TOTAL KNEE ARTHROPLASTY;  Surgeon: Ranee Gosselin, MD;  Location: [facility] ORS;  Service: Orthopedics;  Laterality: Left;    I have reviewed the social history and family history with the patient and they are unchanged from previous note.  ALLERGIES:  is  allergic to aspirin.  MEDICATIONS:  Current Outpatient Medications  Medication Sig Dispense Refill   acetaminophen (TYLENOL) 650 MG CR tablet Take 1,300 mg by mouth as needed.     amLODipine (NORVASC) 10 MG tablet Take 1 tablet by mouth once daily 90 tablet 1   atorvastatin (LIPITOR) 40 MG tablet TAKE 1 TABLET BY MOUTH ONCE DAILY AT 6 PM 90 tablet 1   brimonidine (ALPHAGAN) 0.2 % ophthalmic solution Place 1 drop into both eyes 2 (two) times daily.     Calcium Carb-Cholecalciferol (CALCIUM 600 + D PO) Take 1 tablet by mouth in the morning.     carboxymethylcellulose (REFRESH PLUS) 0.5 % SOLN Place 1 drop into both eyes 3 (three) times daily as needed (dry eyes).       Cholecalciferol (VITAMIN D-3) 1000 UNITS CAPS Take 1,000 Units by mouth in the morning.     diclofenac Sodium (VOLTAREN) 1 % GEL Apply 2 g topically as needed.     dorzolamide-timolol (COSOPT) 22.3-6.8 MG/ML ophthalmic solution Place 1 drop into both eyes 2 (two) times daily.     Ferrous Sulfate (IRON) 325 (65 Fe) MG TABS Take 1 tablet by mouth once daily 90 tablet 0   gabapentin (NEURONTIN) 300 MG capsule Take 1 capsule by mouth at bedtime 90 capsule 0   imatinib (GLEEVEC) 100 MG tablet Take 3 tablets (300 mg total) by mouth daily. Take with meals and large glass of water.Caution:Chemotherapy 90 tablet 2   lisinopril (ZESTRIL) 10 MG tablet Take 1 tablet by mouth once daily 90 tablet 1   loperamide (IMODIUM A-D) 2 MG tablet Take 1 mg by mouth daily as needed.     Multiple Vitamin (MULTIVITAMIN WITH MINERALS) TABS tablet Take 1 tablet by mouth daily.     nystatin cream (MYCOSTATIN) Apply 1 Application topically 2 (two) times daily. 30 g 3   Omega-3 Fatty Acids (OMEGA 3 PO) Take 1,200 mg by mouth in the morning.     ondansetron (ZOFRAN) 8 MG tablet Take 1 tablet (8 mg total) by mouth every 8 (eight) hours as needed for nausea or vomiting. 20 tablet 2   pantoprazole (PROTONIX) 40 MG tablet Take 1 tablet (40 mg total) by mouth daily. 90 tablet 3   ROCKLATAN 0.02-0.005 % SOLN Apply 1 drop to eye at bedtime.     Zinc Oxide (DESITIN EX) Apply 1 application topically as needed.     No current facility-administered medications for this visit.    PHYSICAL EXAMINATION: ECOG PERFORMANCE STATUS: 0 - Asymptomatic  Vitals:   10/21/22 1411  BP: (!) 147/74  Pulse: 75  Resp: 18  Temp: 97.6 F (36.4 C)  SpO2: 100%   Wt Readings from Last 3 Encounters:  10/21/22 180 lb 8 oz (81.9 kg)  09/15/22 179 lb (81.2 kg)  07/29/22 181 lb (82.1 kg)     GENERAL:alert, no distress and comfortable SKIN: skin color, texture, turgor are normal, no rashes or significant lesions EYES: normal, Conjunctiva are pink  and non-injected, sclera clear NECK: supple, thyroid normal size, non-tender, without nodularity LYMPH:  no palpable lymphadenopathy in the cervical, axillary  LUNGS: clear to auscultation and percussion with normal breathing effort HEART: regular rate & rhythm and no murmurs and no lower extremity edema ABDOMEN:abdomen soft, non-tender and normal bowel sounds Musculoskeletal:no cyanosis of digits and no clubbing  NEURO: alert & oriented x 3 with fluent speech, no focal motor/sensory deficits   LABORATORY DATA:  I have reviewed the data as listed  Latest Ref Rng & Units 10/14/2022    9:51 AM 07/13/2022    9:51 AM 05/25/2022    9:49 AM  CBC  WBC 4.0 - 10.5 K/uL 6.6  7.0  7.1   Hemoglobin 12.0 - 15.0 g/dL 9.8  16.1  09.6   Hematocrit 36.0 - 46.0 % 29.8  31.6  33.7   Platelets 150 - 400 K/uL 252  277  269         Latest Ref Rng & Units 10/14/2022    9:51 AM 07/13/2022    9:51 AM 03/30/2022    8:41 AM  CMP  Glucose 70 - 99 mg/dL 045  87  95   BUN 8 - 23 mg/dL 13  13  13    Creatinine 0.44 - 1.00 mg/dL 4.09  8.11  9.14   Sodium 135 - 145 mmol/L 137  139  140   Potassium 3.5 - 5.1 mmol/L 3.9  4.1  4.2   Chloride 98 - 111 mmol/L 107  108  108   CO2 22 - 32 mmol/L 27  28  25    Calcium 8.9 - 10.3 mg/dL 8.9  8.7  8.8   Total Protein 6.5 - 8.1 g/dL 6.7  6.8  6.8   Total Bilirubin 0.3 - 1.2 mg/dL 0.5  0.5  0.5   Alkaline Phos 38 - 126 U/L 64  47    AST 15 - 41 U/L 21  19  18    ALT 0 - 44 U/L 14  12  12        RADIOGRAPHIC STUDIES: I have personally reviewed the radiological images as listed and agreed with the findings in the report. No results found.    Orders Placed This Encounter  Procedures   Urinalysis, Complete w Microscopic    Standing Status:   Future    Standing Expiration Date:   10/21/2023   All questions were answered. The patient knows to call the clinic with any problems, questions or concerns. No barriers to learning was detected. The total time spent in the  appointment was 25 minutes.     Malachy Mood, MD 10/21/2022

## 2022-10-21 NOTE — Assessment & Plan Note (Signed)
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 S/p partial gastrectomy 01/14/21 by Dr. 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 No lymph nodes were evaluated.  -she has high risk of recurrence (86%) due to the large size and high grade.  -staging chest CT 02/17/21 negative for metastatic disease. 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 She is tolerating well so far with mild to moderate diarrhea, improved with Imodium. She developed worsening epigastric pain, and Gleevec was held for a month. -she restarted Gleevec at 100 mg on 05/22/21 and gradually increased to 300 mg daily in June, she is tolerating well so far  -restaging CT CAP from 12/31/2021 showed no evidence of recurrence.  Previous nodular area adjacent to the suture margin has resolved. 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 -She is clinically doing well, tolerating imatinib well, no clinical concern for recurrence. -Surveillance CT scan on October 14 2022 was negative for disease recurrence, except a 6 mm nodule at the suture line of her stomach.  She had EGD in July 2024 which was negative

## 2022-11-02 DIAGNOSIS — Z961 Presence of intraocular lens: Secondary | ICD-10-CM | POA: Diagnosis not present

## 2022-11-02 DIAGNOSIS — H43813 Vitreous degeneration, bilateral: Secondary | ICD-10-CM | POA: Diagnosis not present

## 2022-11-02 DIAGNOSIS — E119 Type 2 diabetes mellitus without complications: Secondary | ICD-10-CM | POA: Diagnosis not present

## 2022-11-02 DIAGNOSIS — H401131 Primary open-angle glaucoma, bilateral, mild stage: Secondary | ICD-10-CM | POA: Diagnosis not present

## 2022-11-02 DIAGNOSIS — H26493 Other secondary cataract, bilateral: Secondary | ICD-10-CM | POA: Diagnosis not present

## 2022-11-02 DIAGNOSIS — H35033 Hypertensive retinopathy, bilateral: Secondary | ICD-10-CM | POA: Diagnosis not present

## 2022-11-03 ENCOUNTER — Other Ambulatory Visit: Payer: Self-pay

## 2022-11-03 DIAGNOSIS — R319 Hematuria, unspecified: Secondary | ICD-10-CM | POA: Diagnosis not present

## 2022-11-03 DIAGNOSIS — C49A2 Gastrointestinal stromal tumor of stomach: Secondary | ICD-10-CM | POA: Diagnosis not present

## 2022-11-03 DIAGNOSIS — Z79899 Other long term (current) drug therapy: Secondary | ICD-10-CM | POA: Diagnosis not present

## 2022-11-03 LAB — URINALYSIS, COMPLETE (UACMP) WITH MICROSCOPIC
Bacteria, UA: NONE SEEN
Bilirubin Urine: NEGATIVE
Glucose, UA: NEGATIVE mg/dL
Ketones, ur: NEGATIVE mg/dL
Leukocytes,Ua: NEGATIVE
Nitrite: NEGATIVE
Protein, ur: 100 mg/dL — AB
RBC / HPF: >50 RBC/hpf (ref 0–5)
Specific Gravity, Urine: 1.009 (ref 1.005–1.030)
WBC, UA: >50 WBC/hpf (ref 0–5)
pH: 6 (ref 5.0–8.0)

## 2022-11-13 ENCOUNTER — Telehealth: Payer: Self-pay

## 2022-11-13 NOTE — Telephone Encounter (Addendum)
Called patient to relay message below as per Dr. Mosetta Putt. She voiced full understanding and stated she would follow up with the urologist.   ----- Message from Malachy Mood sent at 11/10/2022 10:55 AM EDT ----- Please let pt know her urine test did show blood in her urine, but no infection. She needs to f/u with her PCP or urologist for this. Thanks   Malachy Mood

## 2022-11-27 ENCOUNTER — Other Ambulatory Visit: Payer: Self-pay | Admitting: Family

## 2022-11-27 DIAGNOSIS — D509 Iron deficiency anemia, unspecified: Secondary | ICD-10-CM

## 2022-12-14 ENCOUNTER — Other Ambulatory Visit: Payer: Self-pay | Admitting: Family

## 2022-12-14 DIAGNOSIS — E1149 Type 2 diabetes mellitus with other diabetic neurological complication: Secondary | ICD-10-CM

## 2022-12-28 DIAGNOSIS — M25561 Pain in right knee: Secondary | ICD-10-CM | POA: Diagnosis not present

## 2022-12-28 DIAGNOSIS — Z96653 Presence of artificial knee joint, bilateral: Secondary | ICD-10-CM | POA: Diagnosis not present

## 2023-01-07 ENCOUNTER — Other Ambulatory Visit (HOSPITAL_COMMUNITY): Payer: Self-pay

## 2023-01-07 ENCOUNTER — Telehealth: Payer: Self-pay | Admitting: Pharmacy Technician

## 2023-01-07 NOTE — Telephone Encounter (Signed)
Oral Oncology Patient Advocate Encounter  Patient reached out to the office regarding a delay in her next shipment of imatinib through [facility]. The pharmacy is having supply issues causing a delay in her order being sent out. The delay is expected to be corrected around 01/20/23.  Patient has ~1 week of medication on hand currently. She is in agreement to pay cash pricing at [facility] for an additional week's supply next week if there has been no change to the imatinib availability at [facility].   Patient has also been placed on the internal grant waitlist for GIST in case funding opens that will allow her to fill under insurance at [facility] and hopefully avoid this type of delay in the future.  Jinger Neighbors, CPhT-Adv Oncology Pharmacy Patient Advocate [facility] Direct Number: [phone]  Fax: [phone]

## 2023-01-11 ENCOUNTER — Ambulatory Visit (INDEPENDENT_AMBULATORY_CARE_PROVIDER_SITE_OTHER): Payer: Medicare Other | Admitting: Podiatry

## 2023-01-11 DIAGNOSIS — M79675 Pain in left toe(s): Secondary | ICD-10-CM | POA: Diagnosis not present

## 2023-01-11 DIAGNOSIS — B351 Tinea unguium: Secondary | ICD-10-CM

## 2023-01-11 DIAGNOSIS — M79674 Pain in right toe(s): Secondary | ICD-10-CM

## 2023-01-11 DIAGNOSIS — L84 Corns and callosities: Secondary | ICD-10-CM | POA: Diagnosis not present

## 2023-01-11 DIAGNOSIS — E1142 Type 2 diabetes mellitus with diabetic polyneuropathy: Secondary | ICD-10-CM

## 2023-01-11 NOTE — Progress Notes (Signed)
  Subjective:  Patient ID: Alison [name], female    DOB: Jun 10, 1945,   MRN: [id number]  No chief complaint on file.   77 y.o. female presents for diabetic foot exam and  concern of thickened elongated and painful nails that are difficult to trim. Requesting to have them trimmed today. Relates burning and tingling in their feet. Patient is diabetic and last A1c was  Lab Results  Component Value Date   HGBA1C 5.5 03/30/2022   .   PCP:  Ngetich, Donalee Citrin, NP    . Denies any other pedal complaints. Denies n/v/f/c.   Past Medical History:  Diagnosis Date   Anxiety    Benign essential hypertension    Blood transfusion without reported diagnosis    Cataract    removed bilateraly   Diabetes mellitus without complication (HCC)    Diverticulitis of colon (without mention of hemorrhage)(562.11)    GERD (gastroesophageal reflux disease)    Goiter, specified as simple    Helicobacter pylori gastritis    Hyperlipidemia LDL goal < 100    Intrahepatic bile duct dilation    Numbness    TOES RT FOOT and toes left foot   Obesity, unspecified    Osteoarthrosis, unspecified whether generalized or localized, lower leg    Osteopenia    Other specified disease of nail    FUNGUS OF FINGERNAILS   Pernicious anemia    Unspecified glaucoma(365.9)    Unspecified vitamin D deficiency    Wears dentures    Wears glasses     Objective:  Physical Exam: Vascular: DP/PT pulses 2/4 bilateral. CFT <3 seconds. Absent hair growth on digits. Edema noted to bilateral lower extremities. Xerosis noted bilaterally.  Skin. No lacerations or abrasions bilateral feet. Nails 1-5 bilateral  are thickened discolored and elongated with subungual debris. Hyperkeratotic tissue noted sub fifth metatarsal head bilateral.  Musculoskeletal: MMT 5/5 bilateral lower extremities in DF, PF, Inversion and Eversion. Deceased ROM in DF of ankle joint.  Neurological: Sensation intact to light touch. Protective sensation diminished  bilateral.    Assessment:   1. Pain due to onychomycosis of toenails of both feet   2. Corns and callosities   3. Diabetic peripheral neuropathy associated with type 2 diabetes mellitus (HCC)       Plan:  Patient was evaluated and treated and all questions answered. -Discussed and educated patient on diabetic foot care, especially with  regards to the vascular, neurological and musculoskeletal systems.  -Stressed the importance of good glycemic control and the detriment of not  controlling glucose levels in relation to the foot. -Discussed supportive shoes at all times and checking feet regularly.  -Mechanically debrided all nails 1-5 bilateral using sterile nail nipper and filed with dremel without incident  -Hyperkeratotic tissue debrided without inicident.  -Answered all patient questions -Patient to return  in 4 months for at risk foot care -Patient advised to call the office if any problems or questions arise in the meantime.   Louann Sjogren, DPM

## 2023-01-11 NOTE — Telephone Encounter (Signed)
Oral Oncology Patient Advocate Encounter  Patient called to advise that she received her imatinib in the mail today, 01/11/23.  Jinger Neighbors, CPhT-Adv Oncology Pharmacy Patient Advocate [facility] Direct Number: [phone]  Fax: [phone]

## 2023-01-15 DIAGNOSIS — M25561 Pain in right knee: Secondary | ICD-10-CM | POA: Diagnosis not present

## 2023-01-18 NOTE — Progress Notes (Signed)
 Patient Care Team: Ngetich, Roxan BROCKS, NP as PCP - General (Family Medicine) [place] Elspeth ORN, MD as Referring Physician (Optometry) Heide Ingle, MD as Consulting Physician (Orthopedic Surgery) Octavia Bruckner, MD as Consulting Physician (Ophthalmology) Aneita Gwendlyn DASEN, MD (Inactive) as Consulting Physician (Gastroenterology) Twylla Glendia BROCKS, MD (Urology) Stechschulte, Deward PARAS, MD as Consulting Physician (Surgery) Lanny Callander, MD as Consulting Physician (Hematology)  Clinic Day:  01/20/2023  Referring physician: Leonarda Roxan BROCKS, NP  ASSESSMENT & PLAN:   Assessment & Plan: Gastrointestinal stromal tumor (GIST) (HCC) pT4NxM0, G2, 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 S/p partial gastrectomy 01/14/21 by Dr. Lyndel; path showed 12.8 cm unifocal GIST, margins negative. No lymph nodes were evaluated.  -she has high risk of recurrence (86%) due to the large size and high grade.  -staging chest CT 02/17/21 negative for metastatic disease. -KIT mutation was detected on surgical sample, which indicates she should have a good response to Imatinib   -she began Gleevec  400 mg on 03/09/21. She is tolerating well so far with mild to moderate diarrhea, improved with Imodium. She developed worsening epigastric pain, and Gleevec  was held for a month. -she restarted Gleevec  at 100 mg on 05/22/21 and gradually increased to 300 mg daily in June, she is tolerating well so far  -restaging CT CAP from 12/31/2021 showed no evidence of recurrence.  Previous nodular area adjacent to the suture margin has resolved. 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 -She is clinically doing well, tolerating imatinib  well, no clinical concern for recurrence. -Surveillance CT scan on October 14 2022 was negative for disease recurrence, except a 6 mm nodule at the suture line of her stomach.  She had EGD in  July 2024 which was negative   Plan: Labs reviewed  -CBC showing WBC 8.7; Hgb 10.9; Hct 32.1; Plt 299; Anc 5.4 -CMP - K 4.3; glucose 102; BUN 16; Creatinine 1.09; eGFR 52; Ca 9.5; LFTs normal.   Patient tolerating Gleevec  well and will continue at current dose.  New prescription sent to mail pharmacy.  CT cheat abdomen and pelvis should be scheduled in 3 months. Labs and follow up in 3 months (approximately 1 weeks after CT to review).     The patient understands the plans discussed today and is in agreement with them.  She knows to contact our office if she develops concerns prior to her next appointment.  I provided 25 minutes of face-to-face time during this encounter and > 50% was spent counseling as documented under my assessment and plan.    Powell FORBES Lessen, NP  [facility] - A DEPT OF [facility] [address] [place] [address] Dept: [phone] Dept Fax: [phone]   Orders Placed This Encounter  Procedures   CT CHEST ABDOMEN PELVIS W CONTRAST    Standing Status:   Future    Expected Date:   04/20/2023    Expiration Date:   01/20/2024    If indicated for the ordered procedure, I authorize the administration of contrast media per Radiology protocol:   Yes    Does the patient have a contrast media/X-ray dye allergy?:   No    Preferred imaging location?:   [facility]    If indicated for the ordered procedure, I authorize the administration of oral contrast media per Radiology protocol:   Yes  CHIEF COMPLAINT:  CC: Gastrointestinal stromal tumor  Current Treatment: Gleevec  300 mg daily  INTERVAL HISTORY:  Sharnice is here today for repeat clinical assessment.  She was last seen by Dr. Lanny on 10/21/2022.  Has been tolerating Gleevec  at 300 mg daily.  She is tolerating this well. She is having some increased knee pain for which she sees orthopedic provider. Started her on celebrex  200 mg  which she started on 01/18/2023. She states hat she has not noted a big difference in pain yet. She denies chest pain, chest pressure, or shortness of breath. She denies headaches or visual disturbances. She denies abdominal pain, nausea, vomiting, or changes in bowel or bladder habits. This should be ordered during today's visit.  She denies chest pain, chest pressure, or shortness of breath. She denies headaches or visual disturbances. She denies abdominal pain, nausea, vomiting, or changes in bowel or bladder habits.  She denies fevers or chills. She denies pain. Her appetite is good. Her weight has increased 3 pounds over last 3 months .  I have reviewed the past medical history, past surgical history, social history and family history with the patient and they are unchanged from previous note.  ALLERGIES:  is allergic to aspirin .  MEDICATIONS:  Current Outpatient Medications  Medication Sig Dispense Refill   acetaminophen  (TYLENOL ) 650 MG CR tablet Take 1,300 mg by mouth as needed.     amLODipine  (NORVASC ) 10 MG tablet Take 1 tablet by mouth once daily 90 tablet 1   atorvastatin  (LIPITOR) 40 MG tablet TAKE 1 TABLET BY MOUTH ONCE DAILY AT 6 PM 90 tablet 1   brimonidine (ALPHAGAN) 0.2 % ophthalmic solution Place 1 drop into both eyes 2 (two) times daily.     Calcium  Carb-Cholecalciferol (CALCIUM  600 + D PO) Take 1 tablet by mouth in the morning.     carboxymethylcellulose (REFRESH PLUS) 0.5 % SOLN Place 1 drop into both eyes 3 (three) times daily as needed (dry eyes).      CELEBREX  200 MG capsule 200 mg daily.     Cholecalciferol (VITAMIN D -3) 1000 UNITS CAPS Take 1,000 Units by mouth in the morning.     diclofenac Sodium (VOLTAREN) 1 % GEL Apply 2 g topically as needed.     dorzolamide -timolol  (COSOPT ) 22.3-6.8 MG/ML ophthalmic solution Place 1 drop into both eyes 2 (two) times daily.     Ferrous Sulfate  (IRON ) 325 (65 Fe) MG TABS Take 1 tablet by mouth once daily 90 tablet 1   gabapentin   (NEURONTIN ) 300 MG capsule Take 1 capsule by mouth at bedtime 90 capsule 0   lisinopril  (ZESTRIL ) 10 MG tablet Take 1 tablet by mouth once daily 90 tablet 1   loperamide (IMODIUM A-D) 2 MG tablet Take 1 mg by mouth daily as needed.     Multiple Vitamin (MULTIVITAMIN WITH MINERALS) TABS tablet Take 1 tablet by mouth daily.     Omega-3 Fatty Acids (OMEGA 3 PO) Take 1,200 mg by mouth in the morning.     ondansetron  (ZOFRAN ) 8 MG tablet Take 1 tablet (8 mg total) by mouth every 8 (eight) hours as needed for nausea or vomiting. 20 tablet 2   pantoprazole  (PROTONIX ) 40 MG tablet Take 1 tablet (40 mg total) by mouth daily. 90 tablet 3   ROCKLATAN 0.02-0.005 % SOLN Apply 1 drop to eye at bedtime.     shark liver oil-cocoa butter (PREPARATION H) 0.25-88.44 % suppository Place 1 suppository rectally as needed for hemorrhoids.     Zinc  Oxide (DESITIN EX) Apply 1 application topically as needed.     imatinib  (GLEEVEC ) 100 MG tablet Take 3 tablets (300 mg total) by mouth daily. Take with meals and large glass of water.Caution:Chemotherapy 270 tablet 0   nystatin  cream (MYCOSTATIN ) Apply 1 Application topically 2 (two) times daily. (Patient not taking: Reported on 01/20/2023) 30 g 3   No current facility-administered medications for this visit.     REVIEW OF SYSTEMS:   Constitutional: Denies fevers, chills or abnormal weight loss Eyes: Denies blurriness of vision Ears, nose, mouth, throat, and face: Denies mucositis or sore throat Respiratory: Denies cough, dyspnea or wheezes Cardiovascular: Denies palpitation, chest discomfort or lower extremity swelling Gastrointestinal:  Denies nausea, heartburn or change in bowel habits Skin: Denies abnormal skin rashes Lymphatics: Denies new lymphadenopathy or easy bruising Neurological:Denies numbness, tingling or new weaknesses Behavioral/Psych: Mood is stable, no new changes  Musculoskeletal: knee pain  All other systems were reviewed with the patient and are  negative.   VITALS:   Today's Vitals   01/20/23 1135 01/20/23 1141  BP: (!) 128/53   Pulse: 73   Resp: 17   Temp: 97.8 F (36.6 C)   TempSrc: Temporal   SpO2: 100%   Weight: 183 lb (83 kg)   Height: 5' 6 (1.676 m)   PainSc:  0-No pain   Body mass index is 29.54 kg/m.   Wt Readings from Last 3 Encounters:  01/20/23 183 lb (83 kg)  10/21/22 180 lb 8 oz (81.9 kg)  09/15/22 179 lb (81.2 kg)    Body mass index is 29.54 kg/m.  Performance status (ECOG): 1 - Symptomatic but completely ambulatory  PHYSICAL EXAM:   GENERAL:alert, no distress and comfortable SKIN: skin color, texture, turgor are normal, no rashes or significant lesions EYES: normal, Conjunctiva are pink and non-injected, sclera clear OROPHARYNX:no exudate, no erythema and lips, buccal mucosa, and tongue normal  NECK: supple, thyroid  normal size, non-tender, without nodularity LYMPH:  no palpable lymphadenopathy in the cervical, axillary or inguinal LUNGS: clear to auscultation and percussion with normal breathing effort HEART: regular rate & rhythm and no murmurs and no lower extremity edema ABDOMEN:abdomen soft, non-tender and normal bowel sounds Musculoskeletal:no cyanosis of digits and no clubbing  NEURO: alert & oriented x 3 with fluent speech, no focal motor/sensory deficits  LABORATORY DATA:  I have reviewed the data as listed    Component Value Date/Time   NA 137 01/20/2023 1050   NA 138 07/05/2015 0831   K 4.3 01/20/2023 1050   CL 105 01/20/2023 1050   CO2 28 01/20/2023 1050   GLUCOSE 102 (H) 01/20/2023 1050   BUN 16 01/20/2023 1050   BUN 11 07/05/2015 0831   CREATININE 1.09 (H) 01/20/2023 1050   CREATININE 1.07 (H) 03/30/2022 0841   CALCIUM  9.5 01/20/2023 1050   PROT 7.4 01/20/2023 1050   PROT 6.8 03/29/2015 0814   ALBUMIN 4.3 01/20/2023 1050   ALBUMIN 4.3 03/29/2015 0814   AST 23 01/20/2023 1050   ALT 14 01/20/2023 1050   ALKPHOS 69 01/20/2023 1050   BILITOT 0.6 01/20/2023 1050    GFRNONAA 52 (L) 01/20/2023 1050   GFRNONAA 69 05/13/2020 1111   GFRAA 80 05/13/2020 1111    Lab Results  Component Value Date   WBC 8.7 01/20/2023   NEUTROABS 5.4 01/20/2023   HGB 10.9 (L) 01/20/2023   HCT 32.1 (L) 01/20/2023   MCV 93.0 01/20/2023   PLT 299 01/20/2023

## 2023-01-18 NOTE — Assessment & Plan Note (Signed)
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 S/p partial gastrectomy 01/14/21 by Dr. 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 No lymph nodes were evaluated.  -she has high risk of recurrence (86%) due to the large size and high grade.  -staging chest CT 02/17/21 negative for metastatic disease. 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 She is tolerating well so far with mild to moderate diarrhea, improved with Imodium. She developed worsening epigastric pain, and Gleevec was held for a month. -she restarted Gleevec at 100 mg on 05/22/21 and gradually increased to 300 mg daily in June, she is tolerating well so far  -restaging CT CAP from 12/31/2021 showed no evidence of recurrence.  Previous nodular area adjacent to the suture margin has resolved. 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 -She is clinically doing well, tolerating imatinib well, no clinical concern for recurrence. -Surveillance CT scan on October 14 2022 was negative for disease recurrence, except a 6 mm nodule at the suture line of her stomach.  She had EGD in July 2024 which was negative

## 2023-01-20 ENCOUNTER — Inpatient Hospital Stay: Payer: Medicare Other | Attending: Hematology

## 2023-01-20 ENCOUNTER — Encounter: Payer: Self-pay | Admitting: Nurse Practitioner

## 2023-01-20 ENCOUNTER — Inpatient Hospital Stay (HOSPITAL_BASED_OUTPATIENT_CLINIC_OR_DEPARTMENT_OTHER): Payer: Medicare Other | Admitting: Nurse Practitioner

## 2023-01-20 VITALS — BP 128/53 | HR 73 | Temp 97.8°F | Resp 17 | Ht 66.0 in | Wt 183.0 lb

## 2023-01-20 DIAGNOSIS — C49A0 Gastrointestinal stromal tumor, unspecified site: Secondary | ICD-10-CM

## 2023-01-20 DIAGNOSIS — C49A2 Gastrointestinal stromal tumor of stomach: Secondary | ICD-10-CM | POA: Insufficient documentation

## 2023-01-20 DIAGNOSIS — R197 Diarrhea, unspecified: Secondary | ICD-10-CM | POA: Diagnosis not present

## 2023-01-20 DIAGNOSIS — Z79899 Other long term (current) drug therapy: Secondary | ICD-10-CM | POA: Insufficient documentation

## 2023-01-20 DIAGNOSIS — M25569 Pain in unspecified knee: Secondary | ICD-10-CM | POA: Insufficient documentation

## 2023-01-20 LAB — CBC WITH DIFFERENTIAL (CANCER CENTER ONLY)
Abs Immature Granulocytes: 0.03 10*3/uL (ref 0.00–0.07)
Basophils Absolute: 0 10*3/uL (ref 0.0–0.1)
Basophils Relative: 1 %
Eosinophils Absolute: 0.2 10*3/uL (ref 0.0–0.5)
Eosinophils Relative: 2 %
HCT: 32.1 % — ABNORMAL LOW (ref 36.0–46.0)
Hemoglobin: 10.9 g/dL — ABNORMAL LOW (ref 12.0–15.0)
Immature Granulocytes: 0 %
Lymphocytes Relative: 27 %
Lymphs Abs: 2.3 10*3/uL (ref 0.7–4.0)
MCH: 31.6 pg (ref 26.0–34.0)
MCHC: 34 g/dL (ref 30.0–36.0)
MCV: 93 fL (ref 80.0–100.0)
Monocytes Absolute: 0.7 10*3/uL (ref 0.1–1.0)
Monocytes Relative: 8 %
Neutro Abs: 5.4 10*3/uL (ref 1.7–7.7)
Neutrophils Relative %: 62 %
Platelet Count: 299 10*3/uL (ref 150–400)
RBC: 3.45 MIL/uL — ABNORMAL LOW (ref 3.87–5.11)
RDW: 13.7 % (ref 11.5–15.5)
WBC Count: 8.7 10*3/uL (ref 4.0–10.5)
nRBC: 0 % (ref 0.0–0.2)

## 2023-01-20 LAB — CMP (CANCER CENTER ONLY)
ALT: 14 U/L (ref 0–44)
AST: 23 U/L (ref 15–41)
Albumin: 4.3 g/dL (ref 3.5–5.0)
Alkaline Phosphatase: 69 U/L (ref 38–126)
Anion gap: 4 — ABNORMAL LOW (ref 5–15)
BUN: 16 mg/dL (ref 8–23)
CO2: 28 mmol/L (ref 22–32)
Calcium: 9.5 mg/dL (ref 8.9–10.3)
Chloride: 105 mmol/L (ref 98–111)
Creatinine: 1.09 mg/dL — ABNORMAL HIGH (ref 0.44–1.00)
GFR, Estimated: 52 mL/min — ABNORMAL LOW (ref >60)
Glucose, Bld: 102 mg/dL — ABNORMAL HIGH (ref 70–99)
Potassium: 4.3 mmol/L (ref 3.5–5.1)
Sodium: 137 mmol/L (ref 135–145)
Total Bilirubin: 0.6 mg/dL (ref 0.0–1.2)
Total Protein: 7.4 g/dL (ref 6.5–8.1)

## 2023-01-20 MED ORDER — IMATINIB MESYLATE 100 MG PO TABS: 300.0000 mg | ORAL_TABLET | Freq: Every day | ORAL | 0 refills | Status: DC

## 2023-01-26 ENCOUNTER — Other Ambulatory Visit: Payer: Self-pay | Admitting: Family

## 2023-01-26 DIAGNOSIS — E1169 Type 2 diabetes mellitus with other specified complication: Secondary | ICD-10-CM

## 2023-02-02 DIAGNOSIS — M25561 Pain in right knee: Secondary | ICD-10-CM | POA: Diagnosis not present

## 2023-02-02 DIAGNOSIS — M25562 Pain in left knee: Secondary | ICD-10-CM | POA: Diagnosis not present

## 2023-02-05 ENCOUNTER — Other Ambulatory Visit (HOSPITAL_COMMUNITY): Payer: Self-pay

## 2023-02-08 ENCOUNTER — Telehealth: Payer: Self-pay | Admitting: Pharmacy Technician

## 2023-02-08 NOTE — Telephone Encounter (Signed)
Oral Oncology Patient Advocate Encounter  Called [facility] to confirm patient's order of imatinib had been shipped as patient was concerned about a delay. [facility] confirmed the order was shipped on 02/06/23. I call patient and made them aware.   Jinger Neighbors, CPhT-Adv Oncology Pharmacy Patient Advocate [facility] Direct Number: [phone]  Fax: [phone]

## 2023-03-02 DIAGNOSIS — H401131 Primary open-angle glaucoma, bilateral, mild stage: Secondary | ICD-10-CM | POA: Diagnosis not present

## 2023-03-02 DIAGNOSIS — E119 Type 2 diabetes mellitus without complications: Secondary | ICD-10-CM | POA: Diagnosis not present

## 2023-03-02 DIAGNOSIS — H43813 Vitreous degeneration, bilateral: Secondary | ICD-10-CM | POA: Diagnosis not present

## 2023-03-02 DIAGNOSIS — H35033 Hypertensive retinopathy, bilateral: Secondary | ICD-10-CM | POA: Diagnosis not present

## 2023-03-02 DIAGNOSIS — H26493 Other secondary cataract, bilateral: Secondary | ICD-10-CM | POA: Diagnosis not present

## 2023-03-02 DIAGNOSIS — Z961 Presence of intraocular lens: Secondary | ICD-10-CM | POA: Diagnosis not present

## 2023-03-11 ENCOUNTER — Other Ambulatory Visit: Payer: Self-pay | Admitting: Family

## 2023-03-11 DIAGNOSIS — I10 Essential (primary) hypertension: Secondary | ICD-10-CM

## 2023-03-15 ENCOUNTER — Other Ambulatory Visit: Payer: Medicare Other

## 2023-03-15 ENCOUNTER — Other Ambulatory Visit: Payer: Self-pay | Admitting: Family

## 2023-03-15 DIAGNOSIS — D509 Iron deficiency anemia, unspecified: Secondary | ICD-10-CM | POA: Diagnosis not present

## 2023-03-15 DIAGNOSIS — E785 Hyperlipidemia, unspecified: Secondary | ICD-10-CM | POA: Diagnosis not present

## 2023-03-15 DIAGNOSIS — N1831 Chronic kidney disease, stage 3a: Secondary | ICD-10-CM | POA: Diagnosis not present

## 2023-03-15 DIAGNOSIS — E1149 Type 2 diabetes mellitus with other diabetic neurological complication: Secondary | ICD-10-CM | POA: Diagnosis not present

## 2023-03-15 DIAGNOSIS — K219 Gastro-esophageal reflux disease without esophagitis: Secondary | ICD-10-CM

## 2023-03-16 LAB — LIPID PANEL
Cholesterol: 108 mg/dL (ref <200)
HDL: 44 mg/dL — ABNORMAL LOW (ref >=50)
LDL Cholesterol (Calc): 50 mg/dL
Non-HDL Cholesterol (Calc): 64 mg/dL (ref <130)
Total CHOL/HDL Ratio: 2.5 (calc) (ref <5.0)
Triglycerides: 55 mg/dL (ref <150)

## 2023-03-16 LAB — CBC WITH DIFFERENTIAL/PLATELET
Absolute Lymphocytes: 1782 {cells}/uL (ref 850–3900)
Absolute Monocytes: 537 {cells}/uL (ref 200–950)
Basophils Absolute: 41 {cells}/uL (ref 0–200)
Basophils Relative: 0.6 %
Eosinophils Absolute: 129 {cells}/uL (ref 15–500)
Eosinophils Relative: 1.9 %
HCT: 34.5 % — ABNORMAL LOW (ref 35.0–45.0)
Hemoglobin: 10.8 g/dL — ABNORMAL LOW (ref 11.7–15.5)
MCH: 29.8 pg (ref 27.0–33.0)
MCHC: 31.3 g/dL — ABNORMAL LOW (ref 32.0–36.0)
MCV: 95.3 fL (ref 80.0–100.0)
MPV: 10.3 fL (ref 7.5–12.5)
Monocytes Relative: 7.9 %
Neutro Abs: 4311 {cells}/uL (ref 1500–7800)
Neutrophils Relative %: 63.4 %
Platelets: 302 10*3/uL (ref 140–400)
RBC: 3.62 10*6/uL — ABNORMAL LOW (ref 3.80–5.10)
RDW: 11.7 % (ref 11.0–15.0)
Total Lymphocyte: 26.2 %
WBC: 6.8 10*3/uL (ref 3.8–10.8)

## 2023-03-16 LAB — COMPLETE METABOLIC PANEL WITH GFR
AG Ratio: 1.5 (calc) (ref 1.0–2.5)
ALT: 14 U/L (ref 6–29)
AST: 22 U/L (ref 10–35)
Albumin: 4.2 g/dL (ref 3.6–5.1)
Alkaline phosphatase (APISO): 63 U/L (ref 37–153)
BUN/Creatinine Ratio: 15 (calc) (ref 6–22)
BUN: 16 mg/dL (ref 7–25)
CO2: 26 mmol/L (ref 20–32)
Calcium: 9.4 mg/dL (ref 8.6–10.4)
Chloride: 104 mmol/L (ref 98–110)
Creat: 1.1 mg/dL — ABNORMAL HIGH (ref 0.60–1.00)
Globulin: 2.8 g/dL (ref 1.9–3.7)
Glucose, Bld: 99 mg/dL (ref 65–99)
Potassium: 4.5 mmol/L (ref 3.5–5.3)
Sodium: 136 mmol/L (ref 135–146)
Total Bilirubin: 0.5 mg/dL (ref 0.2–1.2)
Total Protein: 7 g/dL (ref 6.1–8.1)
eGFR: 51 mL/min/{1.73_m2} — ABNORMAL LOW (ref >=60)

## 2023-03-16 LAB — HEMOGLOBIN A1C
Hgb A1c MFr Bld: 5.6 %{Hb} (ref <5.7)
Mean Plasma Glucose: 114 mg/dL
eAG (mmol/L): 6.3 mmol/L

## 2023-03-16 LAB — TSH: TSH: 0.75 m[IU]/L (ref 0.40–4.50)

## 2023-03-18 ENCOUNTER — Ambulatory Visit (INDEPENDENT_AMBULATORY_CARE_PROVIDER_SITE_OTHER): Payer: Medicare Other | Admitting: Family

## 2023-03-18 ENCOUNTER — Encounter: Payer: Self-pay | Admitting: Family

## 2023-03-18 VITALS — BP 121/78 | HR 72 | Temp 97.0°F | Resp 18 | Ht 66.0 in | Wt 183.2 lb

## 2023-03-18 DIAGNOSIS — E1142 Type 2 diabetes mellitus with diabetic polyneuropathy: Secondary | ICD-10-CM | POA: Diagnosis not present

## 2023-03-18 DIAGNOSIS — E1169 Type 2 diabetes mellitus with other specified complication: Secondary | ICD-10-CM

## 2023-03-18 DIAGNOSIS — I10 Essential (primary) hypertension: Secondary | ICD-10-CM

## 2023-03-18 DIAGNOSIS — N1831 Chronic kidney disease, stage 3a: Secondary | ICD-10-CM

## 2023-03-18 DIAGNOSIS — E785 Hyperlipidemia, unspecified: Secondary | ICD-10-CM | POA: Diagnosis not present

## 2023-03-18 DIAGNOSIS — R319 Hematuria, unspecified: Secondary | ICD-10-CM | POA: Diagnosis not present

## 2023-03-18 NOTE — Progress Notes (Signed)
 Provider: Richarda Blade FNP-C   Blanch Stang, Donalee Citrin, NP  Patient Care Team: Nikolina Simerson, Donalee Citrin, NP as PCP - General (Family Medicine) Hanley Seamen Dustin Folks, MD as Referring Physician (Optometry) Ranee Gosselin, MD as Consulting Physician (Orthopedic Surgery) Sallye Lat, MD as Consulting Physician (Ophthalmology) Meryl Dare, MD (Inactive) as Consulting Physician (Gastroenterology) Riki Altes, MD (Urology) Stechschulte, Hyman Hopes, MD as Consulting Physician (Surgery) Malachy Mood, MD as Consulting Physician (Hematology)  Extended Emergency Contact Information Primary Emergency Contact: Lusignan,[name] Home Phone: [phone] Mobile Phone: [phone] Relation: Daughter Secondary Emergency Contact: [place], [place] [address] [place] of [place] Home Phone: [phone] Mobile Phone: [phone] Relation: Sister  Code Status:  Full Code  Goals of care: Advanced Directive information    03/18/2023   11:03 AM  Advanced Directives  Does Patient Have a Medical Advance Directive? No  Would patient like information on creating a medical advance directive? No - Patient declined     Chief Complaint  Patient presents with   Medical Management of Chronic Issues    6 mth fu chronic issues   Immunizations    Covid and Dtap   Health Maintenance    Colonoscopy and Lung Screening    Discussed the use of AI scribe software for clinical note transcription with the patient, who gave verbal consent to proceed.  History of Present Illness    Navina Wohlers is a 78 year old female with gastrointestinal stromal tumor,Type 2 DM,Hyperlipidemia,OA and stage 3 kidney disease who presents for a six-month follow-up.  She experiences chronic knee pain and was previously evaluated by an orthopedic specialist who prescribed Celebrex. She initially thought it was a 30-day prescription but received a 90-day supply. Due to concerns about kidney health, she takes  Celebrex every other day and supplements it with Tylenol once daily at night. She also applies generic Voltaren gel three to four times a day as needed for pain relief, although it does not completely alleviate the pain. She continues to attend senior exercise classes, although she had a break in December due to holiday closures and a family emergency in January.  She has a history of gastrointestinal stromal tumor and is currently on Gleevec, taking 300 mg daily. A CT scan was performed in January, and another is scheduled for next month. She experiences occasional nausea after eating, which she attributes to overeating, and has Zofran available for use as needed. She takes Imodium twice daily to manage bowel looseness.  She has stage 3 kidney disease and is monitored by her oncologist. Recent lab work showed stable kidney function with a BUN of 16. She is concerned about her kidney health due to her medication regimen.  Her current medications include Refresh eye drops, vitamin D, calcium, omega-3 fatty acids, Cosopt, a multivitamin, zinc, Zofran, Protonix, Roclantin, primidone, ferrous sulfate, atorvastatin, amlodipine, lisinopril, gabapentin, and Preparation H. She reports watery eyes as a side effect of her glaucoma medication. She has not used nystatin cream recently but prefers to keep it available.  She feels 'crazy good' overall, except for issues with her knees.   Past Medical History:  Diagnosis Date   Anxiety    Benign essential hypertension    Blood transfusion without reported diagnosis    Cataract    removed bilateraly   Diabetes mellitus without complication (HCC)    Diverticulitis of colon (without mention of hemorrhage)(562.11)    GERD (gastroesophageal reflux disease)    Goiter, specified  as simple    Helicobacter pylori gastritis    Hyperlipidemia LDL goal < 100    Intrahepatic bile duct dilation    Numbness    TOES RT FOOT and toes left foot   Obesity, unspecified     Osteoarthrosis, unspecified whether generalized or localized, lower leg    Osteopenia    Other specified disease of nail    FUNGUS OF FINGERNAILS   Pernicious anemia    Unspecified glaucoma(365.9)    Unspecified vitamin D deficiency    Wears dentures    Wears glasses    Past Surgical History:  Procedure Laterality Date   BACK SURGERY     CATARACT EXTRACTION     right eye 06/20/15 left eye 09/12/15   COLECTOMY  2009   COLONOSCOPY  09/06/2007   Dr Jeani Hawking   DILATION AND CURETTAGE OF UTERUS     ENDOSCOPIC RETROGRADE CHOLANGIOPANCREATOGRAPHY (ERCP) WITH PROPOFOL N/A 03/17/2019   Procedure: ENDOSCOPIC RETROGRADE CHOLANGIOPANCREATOGRAPHY (ERCP) WITH PROPOFOL;  Surgeon: Meryl Dare, MD;  Location[facility] ENDOSCOPY;  Service: Endoscopy;  Laterality: N/A;   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   JOINT REPLACEMENT     right   LUMBAR LAMINECTOMY/DECOMPRESSION MICRODISCECTOMY N/A 03/30/2013   Procedure: CENTRAL DECOMPRESSION L4 - L5 AND EXCISION 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;  Surgeon: Jacki Cones, MD;  Location: [facility] ORS;  Service: Orthopedics;  Laterality: N/A;   MULTIPLE TOOTH EXTRACTIONS     REMOVAL OF STONES  03/17/2019   Procedure: REMOVAL OF STONES;  Surgeon: Meryl Dare, MD;  Location: [facility] ENDOSCOPY;  Service: Endoscopy;;   SIGMOIDOSCOPY  02/16/2018   Dr Russella Dar   SPHINCTEROTOMY  03/17/2019   Procedure: SPHINCTEROTOMY;  Surgeon: Meryl Dare, MD;  Location[facility] ENDOSCOPY;  Service: Endoscopy;;   TOTAL KNEE ARTHROPLASTY Right 02/01/2013   Procedure: RIGHT TOTAL KNEE ARTHROPLASTY;  Surgeon: Jacki Cones, MD;  Location: [facility];  Service: Orthopedics;  Laterality: Right;   TOTAL KNEE ARTHROPLASTY Left 04/18/2014   Procedure: LEFT TOTAL KNEE ARTHROPLASTY;  Surgeon: Ranee Gosselin, MD;  Location: [facility];  Service: Orthopedics;  Laterality: Left;    Allergies  Allergen Reactions   Aspirin Nausea Only and Nausea And Vomiting    Can take 81 mg but can't take  325mg , hard on stomach. Can take 81 mg but can't take 325mg , hard on stomach.    Allergies as of 03/18/2023       Reactions   Aspirin Nausea Only, Nausea And Vomiting   Can take 81 mg but can't take 325mg , hard on stomach. Can take 81 mg but can't take 325mg , hard on stomach.        Medication List        Accurate as of March 18, 2023  1:12 PM. If you have any questions, ask your nurse or doctor.          acetaminophen 650 MG CR tablet Commonly known as: TYLENOL Take 1,300 mg by mouth as needed.   amLODipine 10 MG tablet Commonly known as: NORVASC Take 1 tablet by mouth once daily   atorvastatin 40 MG tablet Commonly known as: LIPITOR TAKE 1 TABLET BY MOUTH ONCE DAILY AT  6PM   brimonidine 0.2 % ophthalmic solution Commonly known as: ALPHAGAN Place 1 drop into both eyes 2 (two) times daily.   CALCIUM 600 + D PO Take 1 tablet by mouth in the morning.   carboxymethylcellulose 0.5 % Soln Commonly known as: REFRESH PLUS Place 1 drop  into both eyes 3 (three) times daily as needed (dry eyes).   CeleBREX 200 MG capsule Generic drug: celecoxib 200 mg daily.   DESITIN EX Apply 1 application topically as needed.   dorzolamide-timolol 2-0.5 % ophthalmic solution Commonly known as: COSOPT Place 1 drop into both eyes 2 (two) times daily.   gabapentin 300 MG capsule Commonly known as: NEURONTIN Take 1 capsule by mouth at bedtime   imatinib 100 MG tablet Commonly known as: GLEEVEC Take 3 tablets (300 mg total) by mouth daily. Take with meals and large glass of water.Caution:Chemotherapy   Iron 325 (65 Fe) MG Tabs Take 1 tablet by mouth once daily   lisinopril 10 MG tablet Commonly known as: ZESTRIL Take 1 tablet by mouth once daily   loperamide 2 MG tablet Commonly known as: IMODIUM A-D Take 1 mg by mouth daily as needed.   multivitamin with minerals Tabs tablet Take 1 tablet by mouth daily.   nystatin cream Commonly known as: MYCOSTATIN Apply 1  Application topically 2 (two) times daily.   OMEGA 3 PO Take 1,200 mg by mouth in the morning.   ondansetron 8 MG tablet Commonly known as: ZOFRAN Take 1 tablet (8 mg total) by mouth every 8 (eight) hours as needed for nausea or vomiting.   pantoprazole 40 MG tablet Commonly known as: PROTONIX Take 1 tablet (40 mg total) by mouth daily.   Preparation H 0.25-88.44 % suppository Generic drug: shark liver oil-cocoa butter Place 1 suppository rectally as needed for hemorrhoids.   Rocklatan 0.02-0.005 % Soln Generic drug: Netarsudil-Latanoprost Apply 1 drop to eye at bedtime.   Vitamin D-3 25 MCG (1000 UT) Caps Take 1,000 Units by mouth in the morning.   Voltaren 1 % Gel Generic drug: diclofenac Sodium Apply 2 g topically as needed.        Review of Systems  Constitutional:  Negative for appetite change, chills, fatigue, fever and unexpected weight change.  HENT:  Negative for congestion, dental problem, ear discharge, ear pain, facial swelling, hearing loss, nosebleeds, postnasal drip, rhinorrhea, sinus pressure, sinus pain, sneezing, sore throat, tinnitus and trouble swallowing.   Eyes:  Positive for visual disturbance. Negative for pain, discharge, redness and itching.       [place] eye drops makes her eyes tear.follows up with Ophthalmology   Respiratory:  Negative for cough, chest tightness, shortness of breath and wheezing.   Cardiovascular:  Negative for chest pain, palpitations and leg swelling.  Gastrointestinal:  Negative for abdominal distention, abdominal pain, blood in stool, constipation, diarrhea, nausea and vomiting.  Endocrine: Negative for cold intolerance, heat intolerance, polydipsia, polyphagia and polyuria.  Genitourinary:  Negative for difficulty urinating, dysuria, flank pain, frequency and urgency.  Musculoskeletal:  Positive for arthralgias and gait problem. Negative for back pain, joint swelling, myalgias, neck pain and neck stiffness.  Skin:   Negative for color change, pallor, rash and wound.  Neurological:  Negative for dizziness, syncope, speech difficulty, weakness, light-headedness, numbness and headaches.  Hematological:  Does not bruise/bleed easily.  Psychiatric/Behavioral:  Negative for agitation, behavioral problems, confusion, hallucinations, self-injury, sleep disturbance and suicidal ideas. The patient is not nervous/anxious.     Immunization History  Administered Date(s) Administered   Fluad Quad(high Dose 65+) 09/05/2018, 09/26/2020, 10/01/2021, 10/02/2021   Fluad Trivalent(High Dose 65+) 09/15/2022   Influenza, High Dose Seasonal PF 10/02/2017, 11/20/2019   Influenza,inj,Quad PF,6+ Mos 10/03/2012, 09/21/2014, 11/07/2015   Influenza,inj,quad, With Preservative 11/12/2016   Influenza-Unspecified 10/28/2013   Moderna Covid-19 Fall Seasonal Vaccine 74yrs &  older 09/15/2022   PFIZER(Purple Top)SARS-COV-2 Vaccination 03/25/2019, 03/25/2019, 04/18/2019, 11/01/2019, 05/23/2020, 10/07/2020   Pneumococcal Conjugate-13 01/04/2014, 01/04/2014, 11/04/2016   Pneumococcal Polysaccharide-23 12/29/2012, 11/19/2017   Respiratory Syncytial Virus Vaccine,Recomb Aduvanted(Arexvy) 08/07/2022   Zoster Recombinant(Shingrix) 11/25/2016, 01/26/2017   Pertinent  Health Maintenance Due  Topic Date Due   Colonoscopy  02/17/2023   OPHTHALMOLOGY EXAM  05/13/2023   FOOT EXAM  06/01/2023   HEMOGLOBIN A1C  09/15/2023   INFLUENZA VACCINE  Completed   DEXA SCAN  Completed      01/14/2022   11:46 AM 03/10/2022    1:28 PM 04/01/2022   12:51 PM 09/15/2022    8:50 AM 03/18/2023   11:03 AM  Fall Risk  Falls in the past year?  0 0 0 0  Was there an injury with Fall?  0 0  0  Fall Risk Category Calculator  0 0  0  (RETIRED) Patient Fall Risk Level Low fall risk      Patient at Risk for Falls Due to  No Fall Risks No Fall Risks  No Fall Risks  Fall risk Follow up  Falls evaluation completed Falls evaluation completed  Falls evaluation completed    Functional Status Survey:    Vitals:   03/18/23 1104  BP: 121/78  Pulse: 72  Resp: 18  Temp: (!) 97 F (36.1 C)  SpO2: 97%  Weight: 183 lb 3.2 oz (83.1 kg)  Height: 5\' 6"  (1.676 m)   Body mass index is 29.57 kg/m. Physical Exam Vitals reviewed.  Constitutional:      General: She is not in acute distress.    Appearance: Normal appearance. She is overweight. She is not ill-appearing or diaphoretic.  HENT:     Head: Normocephalic.     Right Ear: Tympanic membrane, ear canal and external ear normal. There is no impacted cerumen.     Left Ear: Tympanic membrane, ear canal and external ear normal. There is no impacted cerumen.     Nose: Nose normal. No congestion or rhinorrhea.     Mouth/Throat:     Mouth: Mucous membranes are moist.     Pharynx: Oropharynx is clear. No oropharyngeal exudate or posterior oropharyngeal erythema.  Eyes:     General: No scleral icterus.       Right eye: No discharge.        Left eye: No discharge.     Extraocular Movements: Extraocular movements intact.     Conjunctiva/sclera: Conjunctivae normal.     Pupils: Pupils are equal, round, and reactive to light.  Neck:     Vascular: No carotid bruit.  Cardiovascular:     Rate and Rhythm: Normal rate and regular rhythm.     Pulses: Normal pulses.     Heart sounds: Normal heart sounds. No murmur heard.    No friction rub. No gallop.  Pulmonary:     Effort: Pulmonary effort is normal. No respiratory distress.     Breath sounds: Normal breath sounds. No wheezing, rhonchi or rales.  Chest:     Chest wall: No tenderness.  Abdominal:     General: Bowel sounds are normal. There is no distension.     Palpations: Abdomen is soft. There is no mass.     Tenderness: There is no abdominal tenderness. There is no right CVA tenderness, left CVA tenderness, guarding or rebound.  Musculoskeletal:        General: No swelling or tenderness. Normal range of motion.     Cervical back: Normal range  of motion. No  rigidity or tenderness.     Right lower leg: No edema.     Left lower leg: No edema.  Lymphadenopathy:     Cervical: No cervical adenopathy.  Skin:    General: Skin is warm and dry.     Coloration: Skin is not pale.     Findings: No bruising, erythema, lesion or rash.  Neurological:     Mental Status: She is alert and oriented to person, place, and time.     Cranial Nerves: No cranial nerve deficit.     Sensory: No sensory deficit.     Motor: No weakness.     Coordination: Coordination normal.     Gait: Gait abnormal.     Comments: Ambulates with a cane   Psychiatric:        Mood and Affect: Mood normal.        Speech: Speech normal.        Behavior: Behavior normal.        Thought Content: Thought content normal.        Judgment: Judgment normal.     Labs reviewed: Recent Labs    10/14/22 0951 01/20/23 1050 03/15/23 0833  NA 137 137 136  K 3.9 4.3 4.5  CL 107 105 104  CO2 27 28 26   GLUCOSE 109* 102* 99  BUN 13 16 16   CREATININE 1.04* 1.09* 1.10*  CALCIUM 8.9 9.5 9.4   Recent Labs    07/13/22 0951 10/14/22 0951 01/20/23 1050 03/15/23 0833  AST 19 21 23 22   ALT 12 14 14 14   ALKPHOS 47 64 69  --   BILITOT 0.5 0.5 0.6 0.5  PROT 6.8 6.7 7.4 7.0  ALBUMIN 3.8 3.8 4.3  --    Recent Labs    10/14/22 0951 01/20/23 1050 03/15/23 0833  WBC 6.6 8.7 6.8  NEUTROABS 4.3 5.4 4,311  HGB 9.8* 10.9* 10.8*  HCT 29.8* 32.1* 34.5*  MCV 95.8 93.0 95.3  PLT 252 299 302   Lab Results  Component Value Date   TSH 0.75 03/15/2023   Lab Results  Component Value Date   HGBA1C 5.6 03/15/2023   Lab Results  Component Value Date   CHOL 108 03/15/2023   HDL 44 (L) 03/15/2023   LDLCALC 50 03/15/2023   TRIG 55 03/15/2023   CHOLHDL 2.5 03/15/2023    Significant Diagnostic Results in last 30 days:  No results found.  Assessment/Plan  Knee Pain Chronic knee pain likely due to osteoarthritis. Uses Celebrex every other day, Tylenol nightly, and generic Voltaren gel 3-4  times daily as needed. Pain is managed but not completely relieved. Aware of potential kidney risks with Celebrex and prefers sparing use. - Continue Celebrex as prescribed - Continue Tylenol nightly - Continue using generic Voltaren gel as needed  Stage 3 Chronic Kidney Disease Stage 3 chronic kidney disease monitored by oncologist. No recent changes in kidney function. Concerned about kidney health due to Celebrex use. - Continue monitoring kidney function with oncologist  Gastrointestinal Stromal Tumor (GIST) On Gleevec 300 mg daily. Last oncologist follow-up in January. CT scan scheduled next month. Reports adherence to medication regimen. - Continue Gleevec 300 mg daily - Undergo scheduled CT scan next month as scheduled   Anemia Hemoglobin levels slightly low but stable (10.8). Taking ferrous sulfate. Reports hematuria contributing to anemia. - Continue ferrous sulfate as prescribed  Glaucoma Using Cosopt, Roclantin, and Primidone. Reports excessive tearing, likely a side effect of medication. - Continue current  eye drop regimen - continue to follow up with opthalmology   Hypertension Blood pressure well controlled with amlodipine 10 mg and lisinopril 10 mg. - Continue amlodipine 10 mg daily - Continue lisinopril 10 mg daily  Hyperlipidemia Cholesterol levels well controlled with atorvastatin 40 mg. Total cholesterol 108, LDL 50, triglycerides 55. HDL slightly low at 44 but improved. Encouraged to exercise to improve HDL. - Continue atorvastatin 40 mg daily - Encourage regular exercise to improve HDL levels  Type 2 Diabetes Mellitus A1c well controlled at 5.6. - Continue current diabetes management  General Health Maintenance Up to date with most health maintenance activities. Colonoscopy due and needs to be scheduled. - Schedule colonoscopy as it was due in February  Follow-up - Obtain urine specimen to check for proteinuria - Schedule six-month follow-up  appointment.  Family/ staff Communication: Reviewed plan of care with patient verbalized understanding   Labs/tests ordered: None   Next Appointment : Return in about 6 months (around 09/18/2023) for medical mangement of chronic issues., Fasting labs in 6 months prior to visit.   Spent 30 minutes of Face to face and non-face to face with patient  >50% time spent counseling; reviewing medical record; tests; labs; documentation and developing future plan of care.   Caesar Bookman, NP

## 2023-03-19 LAB — MICROALBUMIN / CREATININE URINE RATIO
Creatinine, Urine: 170 mg/dL (ref 20–275)
Microalb Creat Ratio: 88 mg/g{creat} — ABNORMAL HIGH (ref <30)
Microalb, Ur: 14.9 mg/dL

## 2023-03-19 LAB — URINE CULTURE
MICRO NUMBER:: 16168946
Result:: NO GROWTH
SPECIMEN QUALITY:: ADEQUATE

## 2023-04-12 ENCOUNTER — Ambulatory Visit (HOSPITAL_COMMUNITY)
Admission: RE | Admit: 2023-04-12 | Discharge: 2023-04-12 | Disposition: A | Discharge status: Home / Self Care | Payer: Medicare Other | Source: Ambulatory Visit | Attending: Nurse Practitioner | Admitting: Nurse Practitioner

## 2023-04-12 ENCOUNTER — Encounter (HOSPITAL_COMMUNITY): Payer: Self-pay

## 2023-04-12 DIAGNOSIS — D259 Leiomyoma of uterus, unspecified: Secondary | ICD-10-CM | POA: Diagnosis not present

## 2023-04-12 DIAGNOSIS — I700 Atherosclerosis of aorta: Secondary | ICD-10-CM | POA: Diagnosis not present

## 2023-04-12 DIAGNOSIS — C49A0 Gastrointestinal stromal tumor, unspecified site: Secondary | ICD-10-CM | POA: Diagnosis not present

## 2023-04-12 DIAGNOSIS — K8020 Calculus of gallbladder without cholecystitis without obstruction: Secondary | ICD-10-CM | POA: Diagnosis not present

## 2023-04-12 MED ORDER — SODIUM CHLORIDE (PF) 0.9 % IJ SOLN
INTRAMUSCULAR | Status: AC
  Filled 2023-04-12: qty 50

## 2023-04-12 MED ORDER — IOHEXOL 300 MG/ML  SOLN
100.0000 mL | Freq: Once | INTRAMUSCULAR | Status: AC | PRN
  Administered 2023-04-12: 100 mL via INTRAVENOUS

## 2023-04-20 ENCOUNTER — Other Ambulatory Visit: Payer: Self-pay

## 2023-04-20 DIAGNOSIS — C49A0 Gastrointestinal stromal tumor, unspecified site: Secondary | ICD-10-CM

## 2023-04-21 ENCOUNTER — Encounter: Payer: Self-pay | Admitting: Hematology

## 2023-04-21 ENCOUNTER — Inpatient Hospital Stay: Payer: Medicare Other | Attending: Hematology

## 2023-04-21 ENCOUNTER — Inpatient Hospital Stay (HOSPITAL_BASED_OUTPATIENT_CLINIC_OR_DEPARTMENT_OTHER): Payer: Medicare Other | Admitting: Hematology

## 2023-04-21 VITALS — BP 140/78 | HR 80 | Temp 97.6°F | Resp 17 | Ht 66.0 in | Wt 183.8 lb

## 2023-04-21 DIAGNOSIS — C49A0 Gastrointestinal stromal tumor, unspecified site: Secondary | ICD-10-CM | POA: Diagnosis not present

## 2023-04-21 DIAGNOSIS — R319 Hematuria, unspecified: Secondary | ICD-10-CM | POA: Diagnosis not present

## 2023-04-21 DIAGNOSIS — D649 Anemia, unspecified: Secondary | ICD-10-CM | POA: Diagnosis not present

## 2023-04-21 DIAGNOSIS — Z79899 Other long term (current) drug therapy: Secondary | ICD-10-CM | POA: Insufficient documentation

## 2023-04-21 DIAGNOSIS — Z903 Acquired absence of stomach [part of]: Secondary | ICD-10-CM | POA: Diagnosis not present

## 2023-04-21 DIAGNOSIS — C49A2 Gastrointestinal stromal tumor of stomach: Secondary | ICD-10-CM | POA: Insufficient documentation

## 2023-04-21 DIAGNOSIS — N289 Disorder of kidney and ureter, unspecified: Secondary | ICD-10-CM | POA: Diagnosis not present

## 2023-04-21 DIAGNOSIS — R110 Nausea: Secondary | ICD-10-CM | POA: Diagnosis not present

## 2023-04-21 LAB — CMP (CANCER CENTER ONLY)
ALT: 13 U/L (ref 0–44)
AST: 22 U/L (ref 15–41)
Albumin: 4.3 g/dL (ref 3.5–5.0)
Alkaline Phosphatase: 67 U/L (ref 38–126)
Anion gap: 5 (ref 5–15)
BUN: 16 mg/dL (ref 8–23)
CO2: 27 mmol/L (ref 22–32)
Calcium: 9.3 mg/dL (ref 8.9–10.3)
Chloride: 104 mmol/L (ref 98–111)
Creatinine: 1.22 mg/dL — ABNORMAL HIGH (ref 0.44–1.00)
GFR, Estimated: 45 mL/min — ABNORMAL LOW (ref >60)
Glucose, Bld: 104 mg/dL — ABNORMAL HIGH (ref 70–99)
Potassium: 4.3 mmol/L (ref 3.5–5.1)
Sodium: 136 mmol/L (ref 135–145)
Total Bilirubin: 0.6 mg/dL (ref 0.0–1.2)
Total Protein: 7.2 g/dL (ref 6.5–8.1)

## 2023-04-21 LAB — CBC WITH DIFFERENTIAL (CANCER CENTER ONLY)
Abs Immature Granulocytes: 0.02 10*3/uL (ref 0.00–0.07)
Basophils Absolute: 0 10*3/uL (ref 0.0–0.1)
Basophils Relative: 1 %
Eosinophils Absolute: 0.1 10*3/uL (ref 0.0–0.5)
Eosinophils Relative: 2 %
HCT: 32.8 % — ABNORMAL LOW (ref 36.0–46.0)
Hemoglobin: 10.7 g/dL — ABNORMAL LOW (ref 12.0–15.0)
Immature Granulocytes: 0 %
Lymphocytes Relative: 28 %
Lymphs Abs: 2.2 10*3/uL (ref 0.7–4.0)
MCH: 30.7 pg (ref 26.0–34.0)
MCHC: 32.6 g/dL (ref 30.0–36.0)
MCV: 94 fL (ref 80.0–100.0)
Monocytes Absolute: 0.6 10*3/uL (ref 0.1–1.0)
Monocytes Relative: 8 %
Neutro Abs: 5 10*3/uL (ref 1.7–7.7)
Neutrophils Relative %: 61 %
Platelet Count: 275 10*3/uL (ref 150–400)
RBC: 3.49 MIL/uL — ABNORMAL LOW (ref 3.87–5.11)
RDW: 13.4 % (ref 11.5–15.5)
WBC Count: 8 10*3/uL (ref 4.0–10.5)
nRBC: 0 % (ref 0.0–0.2)

## 2023-04-21 MED ORDER — ONDANSETRON HCL 8 MG PO TABS: 8.0000 mg | ORAL_TABLET | Freq: Three times a day (TID) | ORAL | 1 refills | Status: AC | PRN

## 2023-04-21 MED ORDER — IMATINIB MESYLATE 100 MG PO TABS: 300.0000 mg | ORAL_TABLET | Freq: Every day | ORAL | 1 refills | Status: DC

## 2023-04-21 NOTE — Progress Notes (Signed)
 [facility]   Telephone:([phone]) [phone] Fax:(336) [phone]   Clinic Follow up Note   Patient Care Team: Ngetich, Donalee Citrin, NP as PCP - General (Family Medicine) Hanley Seamen, Dustin Folks, MD as Referring Physician (Optometry) Ranee Gosselin, MD as Consulting Physician (Orthopedic Surgery) Sallye Lat, MD as Consulting Physician (Ophthalmology) Meryl Dare, MD (Inactive) as Consulting Physician (Gastroenterology) Riki Altes, MD (Urology) Stechschulte, Hyman Hopes, MD as Consulting Physician (Surgery) Malachy Mood, MD as Consulting Physician (Hematology)  Date of Service:  04/21/2023  CHIEF COMPLAINT: f/u of gastric GIST  CURRENT THERAPY:  Adjuvant imatinib 300 mg daily  Oncology History   Gastrointestinal stromal tumor (GIST) (HCC) pT4NxM0, G2, 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 S/p partial gastrectomy 01/14/21 by Dr. 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 No lymph nodes were evaluated.  -she has high risk of recurrence (86%) due to the large size and high grade.  -staging chest CT 02/17/21 negative for metastatic disease. 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 She is tolerating well so far with mild to moderate diarrhea, improved with Imodium. She developed worsening epigastric pain, and Gleevec was held for a month. -she restarted Gleevec at 100 mg on 05/22/21 and gradually increased to 300 mg daily in June, she is tolerating well so far  -restaging CT CAP from 12/31/2021 showed no evidence of recurrence.  Previous nodular area adjacent to the suture margin has resolved. 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 -She is clinically doing well, tolerating imatinib well, no clinical concern for recurrence. -Surveillance CT  scan on October 14 2022 was negative for disease recurrence, except a 6 mm nodule at the suture line of her stomach.  She had EGD in July 2024 which was negative -Surveillance CT scan from April 02, 2023 showed stable hyperdense nodule adjacent to the greater curvature and small lung nodules, no definitive evidence of recurrence.  Will continue monitoring.    Assessment & Plan Gastrointestinal stromal tumor (GIST) GIST previously resected from the stomach. Currently on Gleevec to prevent recurrence. Recent scan on April 12, 2023, showed no suspicious findings, only surgical changes likely due to scar tissue. Gleevec therapy is ongoing, with a planned completion in February 2026. She obtains Gleevec from [facility] at a cost of approximately $70 for a three-month supply. - Continue Gleevec therapy - Send Gleevec refill to [facility]  Nausea Intermittent nausea with a recent episode of vomiting. She has been using Neotame for nausea management and requires a refill. - Prescribe Neotame for nausea management - Send prescription to [facility] pharmacy on [address]  Hematuria Persistent hematuria, sometimes visible. Previous urine culture in March was negative. Hematuria predates Gleevec therapy. Urologist evaluation showed no abnormalities with cystoscopy. The etiology of hematuria remains unclear. Gleevec can cause bleeding, but she had hematuria prior to starting the medication. - Advise her to contact [facility] for further evaluation - Obtain consent to access urology records  Anemia Mild anemia with hemoglobin at 10.7 g/dL. Other blood counts are well-managed. Slightly elevated creatinine indicating mild renal impairment.  Plan -CT scan reviewed, no evidence of recurrence -Will continue imatinib, I refilled for her today -I reviewed ordinate home - Schedule follow-up appointment in three months -Will communicate with her urologist  regarding  her hematuria.  She signed the consent for information release form today    Discussed the use of AI scribe software for clinical note transcription with the patient, who gave verbal consent to proceed.  History of Present Illness The patient, a 78 year old female with a history of a stomach tumor, presents for follow-up. She reports experiencing nausea and requests a refill of her medication, Neotame, which she uses to manage this symptom. She had a recent episode of vomiting, which was the first in a long time. She also reports seeing blood in her urine, which has been occurring regularly. This symptom was present even before she started taking Gleevec for her stomach tumor, but it has become more visible recently. She has seen a urologist for this issue in the past, and despite a clear examination, the source of the bleeding remains unknown.  The patient is also on Clevex, which she takes once a day, every day. She is due for a refill and requests a three-month supply. She has been on this medication since February 2023, initially at a dose of 400, which was reduced to 300 in October of the same year. She is due to finish her course in February 2026.  She had a scan done on April 12, 2023, which showed no suspicious findings. The patient gets her Gleevec from [facility], paying around seventy dollars for a three-month supply.     All other systems were reviewed with the patient and are negative.  MEDICAL HISTORY:  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   Goiter, specified as simple    Helicobacter pylori gastritis    Hyperlipidemia LDL goal < 100    Intrahepatic bile duct dilation     Numbness    TOES RT FOOT and toes left foot   Obesity, unspecified    Osteoarthrosis, unspecified whether generalized or localized, lower leg    Osteopenia    Other specified disease of nail    FUNGUS OF FINGERNAILS   Pernicious anemia    Unspecified glaucoma(365.9)    Unspecified vitamin D deficiency    Wears dentures    Wears glasses     SURGICAL HISTORY: Past Surgical History:  Procedure Laterality Date   BACK SURGERY     CATARACT EXTRACTION     right eye 06/20/15 left eye 09/12/15   COLECTOMY  2009   COLONOSCOPY  09/06/2007   Dr Jeani Hawking   DILATION AND CURETTAGE OF UTERUS     ENDOSCOPIC RETROGRADE CHOLANGIOPANCREATOGRAPHY (ERCP) WITH PROPOFOL N/A 03/17/2019   Procedure: ENDOSCOPIC RETROGRADE CHOLANGIOPANCREATOGRAPHY (ERCP) WITH PROPOFOL;  Surgeon: Meryl Dare, MD;  Location: [facility] ENDOSCOPY;  Service: Endoscopy;  Laterality: N/A;   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   JOINT REPLACEMENT     right   LUMBAR LAMINECTOMY/DECOMPRESSION MICRODISCECTOMY N/A 03/30/2013   Procedure: CENTRAL DECOMPRESSION L4 - L5 AND EXCISION 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;  Surgeon: Jacki Cones, MD;  Location: [facility] ORS;  Service: Orthopedics;  Laterality: N/A;   MULTIPLE TOOTH EXTRACTIONS     REMOVAL OF STONES  03/17/2019   Procedure: REMOVAL OF STONES;  Surgeon: Meryl Dare, MD;  Location:  [facility] ENDOSCOPY;  Service: Endoscopy;;   SIGMOIDOSCOPY  02/16/2018   Dr Russella Dar   SPHINCTEROTOMY  03/17/2019   Procedure: SPHINCTEROTOMY;  Surgeon: Meryl Dare, MD;  Location: [facility] ENDOSCOPY;  Service: Endoscopy;;   TOTAL KNEE ARTHROPLASTY Right 02/01/2013   Procedure: RIGHT TOTAL KNEE ARTHROPLASTY;  Surgeon: Jacki Cones, MD;  Location: [facility] ORS;  Service: Orthopedics;  Laterality: Right;   TOTAL KNEE ARTHROPLASTY Left 04/18/2014   Procedure: LEFT TOTAL KNEE ARTHROPLASTY;  Surgeon: Ranee Gosselin, MD;  Location: [facility] ORS;  Service: Orthopedics;  Laterality: Left;    I have reviewed  the social history and family history with the patient and they are unchanged from previous note.  ALLERGIES:  is allergic to aspirin.  MEDICATIONS:  Current Outpatient Medications  Medication Sig Dispense Refill   acetaminophen (TYLENOL) 650 MG CR tablet Take 1,300 mg by mouth as needed.     amLODipine (NORVASC) 10 MG tablet Take 1 tablet by mouth once daily 90 tablet 1   atorvastatin (LIPITOR) 40 MG tablet TAKE 1 TABLET BY MOUTH ONCE DAILY AT  6PM 90 tablet 1   brimonidine (ALPHAGAN) 0.2 % ophthalmic solution Place 1 drop into both eyes 2 (two) times daily.     Calcium Carb-Cholecalciferol (CALCIUM 600 + D PO) Take 1 tablet by mouth in the morning.     carboxymethylcellulose (REFRESH PLUS) 0.5 % SOLN Place 1 drop into both eyes 3 (three) times daily as needed (dry eyes).      CELEBREX 200 MG capsule 200 mg daily. Patient takes 1 tab every other day     Cholecalciferol (VITAMIN D-3) 1000 UNITS CAPS Take 1,000 Units by mouth in the morning.     diclofenac Sodium (VOLTAREN) 1 % GEL Apply 2 g topically as needed.     dorzolamide-timolol (COSOPT) 22.3-6.8 MG/ML ophthalmic solution Place 1 drop into both eyes 2 (two) times daily.     Ferrous Sulfate (IRON) 325 (65 Fe) MG TABS Take 1 tablet by mouth once daily 90 tablet 1   gabapentin (NEURONTIN) 300 MG capsule Take 1 capsule by mouth at bedtime 90 capsule 0   lisinopril (ZESTRIL) 10 MG tablet Take 1 tablet by mouth once daily 90 tablet 1   loperamide (IMODIUM A-D) 2 MG tablet Take 1 mg by mouth daily as needed.     Multiple Vitamin (MULTIVITAMIN WITH MINERALS) TABS tablet Take 1 tablet by mouth daily.     nystatin cream (MYCOSTATIN) Apply 1 Application topically 2 (two) times daily. 30 g 3   Omega-3 Fatty Acids (OMEGA 3 PO) Take 1,200 mg by mouth in the morning.     pantoprazole (PROTONIX) 40 MG tablet Take 1 tablet (40 mg total) by mouth daily. 90 tablet 3   ROCKLATAN 0.02-0.005 % SOLN Apply 1 drop to eye at bedtime.     shark liver oil-cocoa  butter (PREPARATION H) 0.25-88.44 % suppository Place 1 suppository rectally as needed for hemorrhoids.     Zinc Oxide (DESITIN EX) Apply 1 application topically as needed.     imatinib (GLEEVEC) 100 MG tablet Take 3 tablets (300 mg total) by mouth daily. Take with meals and large glass of water.Caution:Chemotherapy 270 tablet 1   ondansetron (ZOFRAN) 8 MG tablet Take 1 tablet (8 mg total) by mouth every 8 (eight) hours as needed for nausea or vomiting. 20 tablet 1   No current facility-administered medications for this visit.    PHYSICAL EXAMINATION: ECOG PERFORMANCE STATUS: 1 - Symptomatic but completely ambulatory  Vitals:  04/21/23 1201 04/21/23 1202  BP: (!) 160/88 (!) 140/78  Pulse:    Resp:    Temp:    SpO2:     Wt Readings from Last 3 Encounters:  04/21/23 183 lb 12.8 oz (83.4 kg)  03/18/23 183 lb 3.2 oz (83.1 kg)  01/20/23 183 lb (83 kg)     GENERAL:alert, no distress and comfortable SKIN: skin color, texture, turgor are normal, no rashes or significant lesions EYES: normal, Conjunctiva are pink and non-injected, sclera clear NECK: supple, thyroid normal size, non-tender, without nodularity LYMPH:  no palpable lymphadenopathy in the cervical, axillary  LUNGS: clear to auscultation and percussion with normal breathing effort HEART: regular rate & rhythm and no murmurs and no lower extremity edema ABDOMEN:abdomen soft, non-tender and normal bowel sounds Musculoskeletal:no cyanosis of digits and no clubbing  NEURO: alert & oriented x 3 with fluent speech, no focal motor/sensory deficits  Physical Exam   LABORATORY DATA:  I have reviewed the data as listed    Latest Ref Rng & Units 04/21/2023   11:37 AM 03/15/2023    8:33 AM 01/20/2023   10:50 AM  CBC  WBC 4.0 - 10.5 K/uL 8.0  6.8  8.7   Hemoglobin 12.0 - 15.0 g/dL 16.1  09.6  04.5   Hematocrit 36.0 - 46.0 % 32.8  34.5  32.1   Platelets 150 - 400 K/uL 275  302  299         Latest Ref Rng & Units 04/21/2023    11:37 AM 03/15/2023    8:33 AM 01/20/2023   10:50 AM  CMP  Glucose 70 - 99 mg/dL 409  99  811   BUN 8 - 23 mg/dL 16  16  16    Creatinine 0.44 - 1.00 mg/dL 9.14  7.82  9.56   Sodium 135 - 145 mmol/L 136  136  137   Potassium 3.5 - 5.1 mmol/L 4.3  4.5  4.3   Chloride 98 - 111 mmol/L 104  104  105   CO2 22 - 32 mmol/L 27  26  28    Calcium 8.9 - 10.3 mg/dL 9.3  9.4  9.5   Total Protein 6.5 - 8.1 g/dL 7.2  7.0  7.4   Total Bilirubin 0.0 - 1.2 mg/dL 0.6  0.5  0.6   Alkaline Phos 38 - 126 U/L 67   69   AST 15 - 41 U/L 22  22  23    ALT 0 - 44 U/L 13  14  14        RADIOGRAPHIC STUDIES: I have personally reviewed the radiological images as listed and agreed with the findings in the report. No results found.    No orders of the defined types were placed in this encounter.  All questions were answered. The patient knows to call the clinic with any problems, questions or concerns. No barriers to learning was detected. The total time spent in the appointment was 25 minutes.     Malachy Mood, MD 04/21/2023

## 2023-04-21 NOTE — Assessment & Plan Note (Addendum)
 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 S/p partial gastrectomy 01/14/21 by Dr. 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 No lymph nodes were evaluated.  -she has high risk of recurrence (86%) due to the large size and high grade.  -staging chest CT 02/17/21 negative for metastatic disease. 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 She is tolerating well so far with mild to moderate diarrhea, improved with Imodium. She developed worsening epigastric pain, and Gleevec was held for a month. -she restarted Gleevec at 100 mg on 05/22/21 and gradually increased to 300 mg daily in June, she is tolerating well so far  -restaging CT CAP from 12/31/2021 showed no evidence of recurrence.  Previous nodular area adjacent to the suture margin has resolved. 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 -She is clinically doing well, tolerating imatinib well, no clinical concern for recurrence. -Surveillance CT scan on October 14 2022 was negative for disease recurrence, except a 6 mm nodule at the suture line of her stomach.  She had EGD in July 2024 which was negative -Surveillance CT scan from April 02, 2023 showed stable hyperdense nodule adjacent to the greater curvature and small lung nodules, no definitive evidence of recurrence.  Will continue monitoring.

## 2023-04-23 ENCOUNTER — Other Ambulatory Visit: Payer: Self-pay

## 2023-04-23 NOTE — Progress Notes (Signed)
 Faxed Signed release to [facility]  Requesting recent office visit notes, per Dr. Mosetta Putt. [phone]. Confirmation received Awaiting visit notes from patient's urologist.

## 2023-04-26 ENCOUNTER — Telehealth: Payer: Self-pay

## 2023-04-26 NOTE — Telephone Encounter (Signed)
Message routed to PCP Ngetich, Dinah C, NP  

## 2023-04-26 NOTE — Telephone Encounter (Signed)
 Patient called and notified.

## 2023-04-26 NOTE — Telephone Encounter (Signed)
Message routed to referral coordinator.

## 2023-04-26 NOTE — Telephone Encounter (Signed)
 Copied from CRM [id number]. Topic: Referral - Question >> Apr 26, 2023 12:00 PM Alison [name] wrote: Reason for CRM: Patient calling on update for referral to get colonoscopy. Last visit 03/18/23. Called office instructed to send CRM for Referral for Gastrologist. Needs to call back patient [phone]

## 2023-04-26 NOTE — Telephone Encounter (Signed)
 Copied from CRM [id number]. Topic: Clinical - Medication Question >> Apr 26, 2023 12:03 PM [name] wrote: Reason for CRM: lisinopril (ZESTRIL) 10 MG tablet Patient calling in on request to change Dosage from 10MG  to 20MG  due to patient instructed to up dosage to 10 to 20. Needs call back on update [phone]

## 2023-04-26 NOTE — Telephone Encounter (Signed)
 May increase Lisinopril from 10 mg tablet to 20 mg tablet daily as requested. - Monitor blood pressure and notify provider if less than 100/60 or greater than 140/90

## 2023-04-27 NOTE — Telephone Encounter (Signed)
 Fernand Howard D  You4 minutes ago (8:48 AM)   CB I received the routed phone call message from [place]. I do not see a referral from her PCP to Gastroenterology.  Please advise.

## 2023-04-27 NOTE — Telephone Encounter (Signed)
Message routed to PCP Ngetich, Dinah C, NP  

## 2023-04-27 NOTE — Telephone Encounter (Signed)
 Patient follows up Gastroenterologist at [facility] already.she will need to call [facility] to schedule appointment.

## 2023-04-27 NOTE — Telephone Encounter (Signed)
 MyChart message sent to patient as another form of communication. Patient didn't mention this on the phone yesterday when we spoke about medication recommendation from PCP Ngetich, Dinah C, NP

## 2023-05-03 ENCOUNTER — Encounter: Payer: Self-pay | Admitting: Urology

## 2023-05-03 ENCOUNTER — Other Ambulatory Visit: Payer: Self-pay | Admitting: Family

## 2023-05-03 DIAGNOSIS — I10 Essential (primary) hypertension: Secondary | ICD-10-CM

## 2023-05-03 NOTE — Telephone Encounter (Unsigned)
 Copied from CRM [id number]. Topic: Clinical - Medication Refill >> May 03, 2023  2:43 PM Eleanore Grey wrote: Most Recent Primary Care Visit:  Provider: Christean Courts C  Department: [facility]  Visit Type: OFFICE VISIT  Date: 03/18/2023  Medication: lisinopril  (ZESTRIL ) 20 MG tablet (patient has been taking 20 mg daily instead of 10mg , PCP is aware)  Has the patient contacted their pharmacy? Yes (Agent: If no, request that the patient contact the pharmacy for the refill. If patient does not wish to contact the pharmacy document the reason why and proceed with request.) (Agent: If yes, when and what did the pharmacy advise?)  Is this the correct pharmacy for this prescription? Yes If no, delete pharmacy and type the correct one.  This is the patient's preferred pharmacy:  [facility] [address], [place] - [address] [place] [address] Phone: [phone] Fax: [phone]  Melodee Spruce [facility] - [facility] [address] [place] [address] Phone: [phone] Fax: [phone]  [facility] [place], [place] - [address] [place]on Caraway [place] [address] Phone: [phone] Fax: [phone]   Has the prescription been filled recently? No  Is the patient out of the medication? No, patient is almost out  Has the patient been seen for an appointment in the last year OR does the patient have an upcoming appointment? Yes  Can we respond through MyChart? Yes  Agent: Please be advised that Rx refills may take up to 3 business days. We ask that you follow-up with your pharmacy.

## 2023-05-04 MED ORDER — LISINOPRIL 10 MG PO TABS: 10.0000 mg | ORAL_TABLET | Freq: Every day | ORAL | 1 refills | Status: DC

## 2023-05-05 ENCOUNTER — Telehealth: Payer: Self-pay | Admitting: Family

## 2023-05-05 MED ORDER — LISINOPRIL 20 MG PO TABS: 20.0000 mg | ORAL_TABLET | Freq: Every day | ORAL | 1 refills | Status: DC

## 2023-05-05 NOTE — Telephone Encounter (Signed)
 Called pt to verify correct pharmacy- pt asking it sent to [facility] on file. Pt is out of med.

## 2023-05-05 NOTE — Telephone Encounter (Signed)
 Copied from CRM [id number]. Topic: Clinical - Medication Refill >> May 03, 2023  2:43 PM Eleanore Grey wrote: Most Recent Primary Care Visit:  Provider: Christean Courts C  Department: [facility]  Visit Type: OFFICE VISIT  Date: 03/18/2023  Medication: lisinopril  (ZESTRIL ) 20 MG tablet (patient has been taking 20 mg daily instead of 10mg , PCP is aware)  Has the patient contacted their pharmacy? Yes (Agent: If no, request that the patient contact the pharmacy for the refill. If patient does not wish to contact the pharmacy document the reason why and proceed with request.) (Agent: If yes, when and what did the pharmacy advise?)  Is this the correct pharmacy for this prescription? Yes If no, delete pharmacy and type the correct one.  This is the patient's preferred pharmacy:  [facility] [address], [place] - [address] [place] [address] Phone: [phone] Fax: [phone]  Melodee Spruce LONG - [facility] [address] [place] [address] Phone: [phone] Fax: [phone]  [facility] - [place], [place] - [address] [place] [address] Phone: [phone] Fax: [phone]   Has the prescription been filled recently? No  Is the patient out of the medication? No, patient is almost out  Has the patient been seen for an appointment in the last year OR does the patient have an upcoming appointment? Yes  Can we respond through MyChart? Yes  Agent: Please be advised that Rx refills may take up to 3 business days. We ask that you follow-up with your pharmacy. >> May 05, 2023 11:28 AM [name] wrote: lisinopril  (ZESTRIL ) 10 MG tablet Patient has issue due to medication dosage is to be changed form 10 MG to 20 MG. Stated spoke to pharmacy at it is still 10 MG. This needs to be correct ASAP due to she is out of medication. Needs call back. [phone]

## 2023-05-05 NOTE — Telephone Encounter (Signed)
 Spoke with patient and she states Dinah told her to increase medication to 20 mg    Ngetich, Dinah C, NP     04/26/23  3:41 PM Note May increase Lisinopril  from 10 mg tablet to 20 mg tablet daily as requested. - Monitor blood pressure and notify provider if less than 100/60 or greater than 140/90      Medication list updated and sent to pharmacy, patient aware.

## 2023-05-12 ENCOUNTER — Ambulatory Visit (INDEPENDENT_AMBULATORY_CARE_PROVIDER_SITE_OTHER): Payer: Medicare Other | Admitting: Podiatry

## 2023-05-12 ENCOUNTER — Encounter: Payer: Self-pay | Admitting: Podiatry

## 2023-05-12 DIAGNOSIS — E1142 Type 2 diabetes mellitus with diabetic polyneuropathy: Secondary | ICD-10-CM

## 2023-05-12 DIAGNOSIS — M79675 Pain in left toe(s): Secondary | ICD-10-CM | POA: Diagnosis not present

## 2023-05-12 DIAGNOSIS — B351 Tinea unguium: Secondary | ICD-10-CM | POA: Diagnosis not present

## 2023-05-12 DIAGNOSIS — M79674 Pain in right toe(s): Secondary | ICD-10-CM

## 2023-05-17 NOTE — Progress Notes (Signed)
  Subjective:  Patient ID: Alison [name], female    DOB: [birth year],  MRN: [id number]  78 y.o. female presents with at risk foot care with history of diabetic neuropathy and painful, elongated thickened toenails x 10 which are symptomatic when wearing enclosed shoe gear. This interferes with his/her daily activities. Chief Complaint  Patient presents with   Diabetes    "Toenails"  Dinah Ngetich - 03/18/2023; A1c - 5.6     PCP: Ngetich, Dinah C, NP.  New problem(s): None.   Review of Systems: Negative except as noted in the HPI.   Allergies  Allergen Reactions   Aspirin  Nausea Only and Nausea And Vomiting    Can take 81 mg but can't take 325mg , hard on stomach. Can take 81 mg but can't take 325mg , hard on stomach.    Objective:  There were no vitals filed for this visit. Constitutional Patient is a pleasant 78 y.o. female in NAD. AAO x 3.  Vascular Capillary fill time to digits <3 seconds.  DP/PT pulse(s) are faintly palpable b/l lower extremities. Pedal hair absent b/l. Lower extremity skin temperature gradient warm to cool b/l. No pain with calf compression b/l. No cyanosis or clubbing noted. No ischemia nor gangrene noted b/l.   Neurologic Pt has subjective symptoms of neuropathy. Protective sensation decreased with 10 gram monofilament b/l.  Dermatologic Pedal skin is thin, shiny and atrophic b/l.  No open wounds b/l lower extremities. No interdigital macerations b/l lower extremities. Toenails 1-5 b/l elongated, discolored, dystrophic, thickened, crumbly with subungual debris and tenderness to dorsal palpation. Minimal hyperkeratos(is/es) noted submet head 5 b/l.  Orthopedic: Normal muscle strength 5/5 to all lower extremity muscle groups bilaterally. HAV with bunion bilaterally and hammertoes 2-5 b/l.   Last HgA1c:     Latest Ref Rng & Units 03/15/2023    8:33 AM  Hemoglobin A1C  Hemoglobin-A1c <5.7 % of total Hgb 5.6    Assessment:   1. Pain due to onychomycosis of  toenails of both feet   2. Diabetic peripheral neuropathy associated with type 2 diabetes mellitus (HCC)    Plan:  Consent given for treatment. Patient examined. All patient's and/or POA's questions/concerns addressed on today's visit. Toenails 1-5 debrided in length and girth without incident. Continue foot and shoe inspections daily. Monitor blood glucose per PCP/Endocrinologist's recommendations. Continue soft, supportive shoe gear daily. Report any pedal injuries to medical professional. Call office if there are any questions/concerns. -Patient/POA to call should there be question/concern in the interim.  Return in about 3 months (around 08/11/2023).  Luella Sager, DPM      [facility] LOCATION: [address] [place] [address]                   Office [phone]   [facility] LOCATION: [address] [place], [place] [address] Office [phone]

## 2023-05-26 ENCOUNTER — Other Ambulatory Visit: Payer: Self-pay | Admitting: Family

## 2023-05-26 DIAGNOSIS — D509 Iron deficiency anemia, unspecified: Secondary | ICD-10-CM

## 2023-06-10 ENCOUNTER — Other Ambulatory Visit: Payer: Self-pay | Admitting: Family

## 2023-06-10 DIAGNOSIS — E1149 Type 2 diabetes mellitus with other diabetic neurological complication: Secondary | ICD-10-CM

## 2023-06-14 ENCOUNTER — Other Ambulatory Visit (HOSPITAL_COMMUNITY): Payer: Self-pay

## 2023-06-14 ENCOUNTER — Telehealth: Payer: Self-pay | Admitting: Gastroenterology

## 2023-06-14 DIAGNOSIS — R310 Gross hematuria: Secondary | ICD-10-CM | POA: Diagnosis not present

## 2023-06-14 MED ORDER — PANTOPRAZOLE SODIUM 40 MG PO TBEC: 40.0000 mg | DELAYED_RELEASE_TABLET | Freq: Every day | ORAL | 5 refills | Status: AC

## 2023-06-14 NOTE — Telephone Encounter (Signed)
 Previous Dr. Sandrea Cruel patient last seen 04/2022 requesting a refill of pantoprazole . You are DOD Dr. Tita Form, please advise if I can send a refill. Thanks

## 2023-06-14 NOTE — Telephone Encounter (Signed)
 Patient called and stated that she is needing a refill on her medication Pantoprazole . Patient is requesting a call back. .Please advise.

## 2023-06-14 NOTE — Telephone Encounter (Signed)
 Prescription sent to patient's pharmacy.

## 2023-07-22 ENCOUNTER — Encounter: Payer: Self-pay | Admitting: Adult Health

## 2023-07-22 ENCOUNTER — Ambulatory Visit: Payer: Medicare Other | Admitting: Adult Health

## 2023-07-22 VITALS — BP 132/78 | HR 76 | Temp 97.8°F | Resp 18 | Ht 66.0 in | Wt 184.0 lb

## 2023-07-22 DIAGNOSIS — Z0000 Encounter for general adult medical examination without abnormal findings: Secondary | ICD-10-CM

## 2023-07-22 DIAGNOSIS — Z23 Encounter for immunization: Secondary | ICD-10-CM

## 2023-07-22 NOTE — Progress Notes (Signed)
 Subjective:   Alison [name] is a 77 y.o. female who presents for Medicare Annual (Subsequent) preventive examination.  Visit Complete: In person  Patient Medicare AWV questionnaire was completed by the patient on [id number]; I have confirmed that all information answered by patient is correct and no changes since this date.  Cardiac Risk Factors include: advanced age (>19men, >7 women);diabetes mellitus;dyslipidemia;hypertension;sedentary lifestyle     Objective:    Today's Vitals   07/22/23 1051 07/22/23 1108  BP: 132/78   Pulse: 76   Resp: 18   Temp: 97.8 F (36.6 C)   SpO2: 96%   Weight: 184 lb (83.5 kg)   Height: 5' 6 (1.676 m)   PainSc:  8    Body mass index is 29.7 kg/m.     07/22/2023   10:51 AM 03/18/2023   11:03 AM 09/15/2022    8:50 AM 07/20/2022    1:26 PM 04/01/2022   12:49 PM 10/15/2021    9:28 AM 07/10/2021    2:20 PM  Advanced Directives  Does Patient Have a Medical Advance Directive? No No No No No No No  Would patient like information on creating a medical advance directive? No - Patient declined No - Patient declined  No - Patient declined No - Patient declined No - Patient declined No - Patient declined    Current Medications (verified) Outpatient Encounter Medications as of 07/22/2023  Medication Sig   acetaminophen  (TYLENOL ) 650 MG CR tablet Take 1,300 mg by mouth as needed.   amLODipine  (NORVASC ) 10 MG tablet Take 1 tablet by mouth once daily   atorvastatin  (LIPITOR) 40 MG tablet TAKE 1 TABLET BY MOUTH ONCE DAILY AT  6PM   brimonidine (ALPHAGAN) 0.2 % ophthalmic solution Place 1 drop into both eyes 2 (two) times daily.   Calcium  Carb-Cholecalciferol (CALCIUM  600 + D PO) Take 1 tablet by mouth in the morning.   carboxymethylcellulose (REFRESH PLUS) 0.5 % SOLN Place 1 drop into both eyes 3 (three) times daily as needed (dry eyes).    CELEBREX  200 MG capsule 200 mg daily. Patient takes 1 tab every other day   Cholecalciferol (VITAMIN D -3) 1000 UNITS  CAPS Take 1,000 Units by mouth in the morning.   diclofenac Sodium (VOLTAREN) 1 % GEL Apply 2 g topically as needed.   dorzolamide -timolol  (COSOPT ) 22.3-6.8 MG/ML ophthalmic solution Place 1 drop into both eyes 2 (two) times daily.   Ferrous Sulfate  (IRON ) 325 (65 Fe) MG TABS Take 1 tablet by mouth once daily   gabapentin  (NEURONTIN ) 300 MG capsule Take 1 capsule by mouth at bedtime   imatinib  (GLEEVEC ) 100 MG tablet Take 3 tablets (300 mg total) by mouth daily. Take with meals and large glass of water.Caution:Chemotherapy   lisinopril  (ZESTRIL ) 20 MG tablet Take 1 tablet (20 mg total) by mouth daily.   loperamide (IMODIUM A-D) 2 MG tablet Take 1 mg by mouth daily as needed.   Multiple Vitamin (MULTIVITAMIN WITH MINERALS) TABS tablet Take 1 tablet by mouth daily.   nystatin  cream (MYCOSTATIN ) Apply 1 Application topically 2 (two) times daily.   Omega-3 Fatty Acids (OMEGA 3 PO) Take 1,200 mg by mouth in the morning.   ondansetron  (ZOFRAN ) 8 MG tablet Take 1 tablet (8 mg total) by mouth every 8 (eight) hours as needed for nausea or vomiting.   pantoprazole  (PROTONIX ) 40 MG tablet Take 1 tablet (40 mg total) by mouth daily.   ROCKLATAN 0.02-0.005 % SOLN Apply 1 drop to eye at bedtime.   shark  liver oil-cocoa butter (PREPARATION H) 0.25-88.44 % suppository Place 1 suppository rectally as needed for hemorrhoids.   Zinc Oxide (DESITIN EX) Apply 1 application topically as needed.   No facility-administered encounter medications on file as of 07/22/2023.    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   Goiter, specified as simple    Helicobacter pylori gastritis     Hyperlipidemia LDL goal < 100    Intrahepatic bile duct dilation    Numbness    TOES RT FOOT and toes left foot   Obesity, unspecified    Osteoarthrosis, unspecified whether generalized or localized, lower leg    Osteopenia    Other specified disease of nail    FUNGUS OF FINGERNAILS   Pernicious anemia    Unspecified glaucoma(365.9)    Unspecified vitamin D  deficiency    Wears dentures    Wears glasses    Past Surgical History:  Procedure Laterality Date   BACK SURGERY     CATARACT EXTRACTION     right eye 06/20/15 left eye 09/12/15   COLECTOMY  2009   COLONOSCOPY  09/06/2007   Dr Belvie Just   DILATION AND CURETTAGE OF UTERUS     ENDOSCOPIC RETROGRADE CHOLANGIOPANCREATOGRAPHY (ERCP) WITH PROPOFOL  N/A 03/17/2019   Procedure: ENDOSCOPIC RETROGRADE CHOLANGIOPANCREATOGRAPHY (ERCP) WITH PROPOFOL ;  Surgeon: Aneita Gwendlyn DASEN, MD;  Location: [facility] ENDOSCOPY;  Service: Endoscopy;  Laterality: N/A;   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   JOINT REPLACEMENT     right   LUMBAR LAMINECTOMY/DECOMPRESSION MICRODISCECTOMY N/A 03/30/2013   Procedure: CENTRAL DECOMPRESSION L4 - L5 AND EXCISION 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;  Surgeon: Tanda DELENA Heading, MD;  Location: [facility];  Service: Orthopedics;  Laterality: N/A;   MULTIPLE TOOTH EXTRACTIONS     REMOVAL OF STONES  03/17/2019   Procedure: REMOVAL OF STONES;  Surgeon: Aneita Gwendlyn DASEN, MD;  Location: [facility] ENDOSCOPY;  Service: Endoscopy;;   SIGMOIDOSCOPY  02/16/2018   Dr Aneita   SPHINCTEROTOMY  03/17/2019   Procedure: SPHINCTEROTOMY;  Surgeon: Aneita Gwendlyn DASEN, MD;  Location: [facility] ENDOSCOPY;  Service: Endoscopy;;   TOTAL KNEE ARTHROPLASTY Right 02/01/2013   Procedure: RIGHT TOTAL KNEE ARTHROPLASTY;  Surgeon: Tanda DELENA Heading, MD;  Location: [facility];  Service: Orthopedics;  Laterality: Right;   TOTAL KNEE ARTHROPLASTY Left 04/18/2014   Procedure: LEFT TOTAL KNEE ARTHROPLASTY;  Surgeon: Tanda Heading, MD;  Location: [facility];  Service:  Orthopedics;  Laterality: Left;   Family History  Problem Relation Age of Onset   Alzheimer's disease Mother    Stroke Mother    Hyperlipidemia Mother    Heart disease Father    Cancer Sister        breast   Diabetes Sister    Cancer Maternal Aunt        breast   Colon polyps Neg Hx    Colon cancer Neg Hx    Esophageal cancer Neg Hx    Rectal cancer Neg Hx    Stomach cancer Neg Hx    Social History   Socioeconomic History   Marital status: Divorced    Spouse name: Not on file   Number  of children: 1   Years of education: Not on file   Highest education level: Not on file  Occupational History   Not on file  Tobacco Use   Smoking status: Former    Current packs/day: 0.00    Average packs/day: 0.5 packs/day for 50.0 years (25.0 ttl pk-yrs)    Types: Cigarettes    Start date: 03/1969    Quit date: 03/2019    Years since quitting: 4.3   Smokeless tobacco: Never  Vaping Use   Vaping status: Never Used  Substance and Sexual Activity   Alcohol  use: No   Drug use: No   Sexual activity: Not Currently  Other Topics Concern   Not on file  Social History Narrative    -- [place] --  in 2009   1 healthy adult daughter   Single   Lives with daughter, independent but does not drive   Stationary exercises most days of the week    Social Drivers of Corporate investment banker Strain: Low Risk  (04/26/2017)   Overall Financial Resource Strain (CARDIA)    Difficulty of Paying Living Expenses: Not hard at all  Food Insecurity: No Food Insecurity (07/22/2023)   Hunger Vital Sign    Worried About Running Out of Food in the Last Year: Never true    Ran Out of Food in the Last Year: Never true  Transportation Needs: No Transportation Needs (07/22/2023)   PRAPARE - Administrator, Civil Service (Medical): No    Lack of Transportation (Non-Medical): No  Physical Activity: Insufficiently Active (04/26/2017)   Exercise Vital Sign    Days of Exercise per Week: 4 days     Minutes of Exercise per Session: 30 min  Stress: No Stress Concern Present (04/26/2017)   [employer] of Occupational Health - Occupational Stress Questionnaire    Feeling of Stress : Only a little  Social Connections: Somewhat Isolated (04/26/2017)   Social Connection and Isolation Panel    Frequency of Communication with Friends and Family: More than three times a week    Frequency of Social Gatherings with Friends and Family: More than three times a week    Attends Religious Services: More than 4 times per year    Active Member of [employer] or Organizations: No    Attends Engineer, structural: Never    Marital Status: Divorced    Tobacco Counseling Counseling given: Not Answered   Clinical Intake:  Pre-visit preparation completed: Yes  Pain : 0-10 Pain Score: 8  Pain Type: Chronic pain Pain Location: Leg Pain Orientation: Right, Left Pain Descriptors / Indicators: Aching Pain Onset: Other (comment) Pain Frequency: Occasional     BMI - recorded: 29.7 Nutritional Status: BMI 25 -29 Overweight Nutritional Risks: Unintentional weight loss Diabetes: Yes CBG done?: No Did pt. bring in CBG monitor from home?: No  How often do you need to have someone help you when you read instructions, pamphlets, or other written materials from your doctor or pharmacy?: 5 - Always  Interpreter Needed?: No      Activities of Daily Living    07/22/2023   11:11 AM  In your present state of health, do you have any difficulty performing the following activities:  Hearing? 0  Vision? 0  Difficulty concentrating or making decisions? 0  Walking or climbing stairs? 0  Comment no steps  Dressing or bathing? 0  Doing errands, shopping? 0  Preparing Food and eating ? N  Using the Toilet?  N  In the past six months, have you accidently leaked urine? N  Do you have problems with loss of bowel control? N  Managing your Medications? N  Managing your Finances? N  Housekeeping or  managing your Housekeeping? N    Patient Care Team: Ngetich, Roxan BROCKS, NP as PCP - General (Family Medicine) Bernstorf, Elspeth ORN, MD as Referring Physician (Optometry) Heide Ingle, MD as Consulting Physician (Orthopedic Surgery) Octavia Bruckner, MD as Consulting Physician (Ophthalmology) Aneita Gwendlyn DASEN, MD (Inactive) as Consulting Physician (Gastroenterology) Twylla Glendia BROCKS, MD (Urology) Stechschulte, Deward PARAS, MD as Consulting Physician (Surgery) Lanny Callander, MD as Consulting Physician (Hematology)  Indicate any recent Medical Services you may have received from other than [facility] providers in the past year (date may be approximate).     Assessment:   This is a routine wellness examination for Alison [name].  Hearing/Vision screen Hearing Screening - Comments:: No hearing issues Vision Screening - Comments:: Pt wear glasses last exam Feb.18th 2025 [facility]   Goals Addressed             This Visit's Progress    DIET - INCREASE WATER INTAKE       Quit Smoking   On track    Patient will try to incorporate new activites and hobbies to help decrease smoking     COMPLETED: Weight < 200 lb (90.7 kg)   184 lb (83.5 kg)    Try to get back down below 200 lbs for next visit.  Continue cycling and try to eat better (with handouts provided).       Depression Screen    07/22/2023   11:13 AM 07/22/2023   10:53 AM 03/18/2023   11:03 AM 09/15/2022    8:50 AM 07/04/2021   12:41 PM 06/28/2020    9:24 AM 01/11/2020   10:24 AM  PHQ 2/9 Scores  PHQ - 2 Score 0 0 0 0 0 0 0    Fall Risk    07/22/2023   11:13 AM 07/22/2023   10:53 AM 03/18/2023   11:03 AM 09/15/2022    8:50 AM 04/01/2022   12:51 PM  Fall Risk   Falls in the past year? 0 0 0 0 0  Number falls in past yr: 0 0 0  0  Injury with Fall? 0 0 0  0  Risk for fall due to :  No Fall Risks No Fall Risks  No Fall Risks  Follow up  Falls evaluation completed Falls evaluation completed  Falls evaluation completed    MEDICARE RISK  AT HOME: Medicare Risk at Home Any stairs in or around the home?: Yes If so, are there any without handrails?: No Home free of loose throw rugs in walkways, pet beds, electrical cords, etc?: No Adequate lighting in your home to reduce risk of falls?: Yes Life alert?: No Use of a cane, walker or w/c?: Yes Grab bars in the bathroom?: Yes Shower chair or bench in shower?: Yes Elevated toilet seat or a handicapped toilet?: Yes  TIMED UP AND GO:  Was the test performed?  Yes  Length of time to ambulate 10 feet: 20 sec Gait steady and fast with assistive device    Cognitive Function:    07/20/2022    1:27 PM 04/26/2017    8:49 AM 02/03/2016    9:10 AM  MMSE - Mini Mental State Exam  Orientation to time 5 5 5    Orientation to Place 5 5 5    Registration 3  3 3   Attention/ Calculation 5 5 5    Recall 3 3 2    Language- name 2 objects 2 2 2    Language- repeat 1 1 1   Language- follow 3 step command 3 3 3    Language- read & follow direction 1 1 1    Write a sentence 1 1 1    Copy design 0 1 1   Total score 29 30 29       Data saved with a previous flowsheet row definition        07/22/2023   10:54 AM 07/04/2021   12:42 PM 06/28/2020    9:28 AM 06/09/2019    8:32 AM 06/07/2018    2:22 PM  6CIT Screen  What Year? 0 points 0 points 0 points 0 points 0 points  What month? 0 points 0 points 0 points 0 points 0 points  What time? 0 points 0 points 0 points 0 points 0 points  Count back from 20 0 points 0 points 4 points 0 points 0 points  Months in reverse 0 points 0 points 0 points 0 points 0 points  Repeat phrase 2 points 0 points 6 points 6 points 0 points  Total Score 2 points 0 points 10 points 6 points 0 points    Immunizations Immunization History  Administered Date(s) Administered   Fluad Quad(high Dose 65+) 09/05/2018, 09/26/2020, 10/01/2021, 10/02/2021   Fluad Trivalent(High Dose 65+) 09/15/2022   Influenza, High Dose Seasonal PF 10/02/2017, 11/20/2019   Influenza,inj,Quad  PF,6+ Mos 10/03/2012, 09/21/2014, 11/07/2015   Influenza,inj,quad, With Preservative 11/12/2016   Influenza-Unspecified 10/28/2013   Moderna Covid-19 Fall Seasonal Vaccine 73yrs & older 09/15/2022   PFIZER(Purple Top)SARS-COV-2 Vaccination 03/25/2019, 03/25/2019, 04/18/2019, 11/01/2019, 05/23/2020, 10/07/2020   Pneumococcal Conjugate-13 01/04/2014, 01/04/2014, 11/04/2016   Pneumococcal Polysaccharide-23 12/29/2012, 11/19/2017   Respiratory Syncytial Virus Vaccine,Recomb Aduvanted(Arexvy) 08/07/2022   Zoster Recombinant(Shingrix) 11/25/2016, 01/26/2017    TDAP status: Due, Education has been provided regarding the importance of this vaccine. Advised may receive this vaccine at local pharmacy or Health Dept. Aware to provide a copy of the vaccination record if obtained from local pharmacy or Health Dept. Verbalized acceptance and understanding.  Flu Vaccine status: Up to date  Pneumococcal vaccine status: Up to date  Covid-19 vaccine status: Completed vaccines  Qualifies for Shingles Vaccine? No   Zostavax completed Yes   Shingrix Completed?: Yes  Screening Tests Health Maintenance  Topic Date Due   DTaP/Tdap/Td (1 - Tdap) Never done   Colonoscopy  02/17/2023   COVID-19 Vaccine (8 - Pfizer risk 2024-25 season) 03/15/2023   OPHTHALMOLOGY EXAM  05/13/2023   FOOT EXAM  06/01/2023   Medicare Annual Wellness (AWV)  07/20/2023   INFLUENZA VACCINE  08/13/2023   HEMOGLOBIN A1C  09/15/2023   Diabetic kidney evaluation - Urine ACR  03/17/2024   Lung Cancer Screening  04/11/2024   Diabetic kidney evaluation - eGFR measurement  04/20/2024   Pneumococcal Vaccine: 50+ Years  Completed   DEXA SCAN  Completed   Hepatitis C Screening  Completed   Zoster Vaccines- Shingrix  Completed   Hepatitis B Vaccines  Aged Out   HPV VACCINES  Aged Out   Meningococcal B Vaccine  Aged Out    Health Maintenance  Health Maintenance Due  Topic Date Due   DTaP/Tdap/Td (1 - Tdap) Never done    Colonoscopy  02/17/2023   COVID-19 Vaccine (8 - Pfizer risk 2024-25 season) 03/15/2023   OPHTHALMOLOGY EXAM  05/13/2023   FOOT EXAM  06/01/2023  Medicare Annual Wellness (AWV)  07/20/2023    Colorectal cancer screening: Referral to GI placed [id number]. Pt aware the office will call re: appt.  Mammogram status: Completed 2024. Repeat every year  Bone Density status: Ordered [id number]. Pt provided with contact info and advised to call to schedule appt.  Lung Cancer Screening: (Low Dose CT Chest recommended if Age 41-80 years, 20 pack-year currently smoking OR have quit w/in 15years.) does not qualify.   Lung Cancer Screening Referral:   Additional Screening:  Hepatitis C Screening: does not qualify; Completed   Vision Screening: Recommended annual ophthalmology exams for early detection of glaucoma and other disorders of the eye. Is the patient up to date with their annual eye exam?  Yes  Who is the provider or what is the name of the office in which the patient attends annual eye exams?  If pt is not established with a provider, would they like to be referred to a provider to establish care? No .   Dental Screening: Recommended annual dental exams for proper oral hygiene  Diabetic Foot Exam: Diabetic Foot Exam: Completed [id number]  Community Resource Referral / Chronic Care Management: CRR required this visit?  No   CCM required this visit?  No     Plan:     I have personally reviewed and noted the following in the patient's chart:   Medical and social history Use of alcohol , tobacco or illicit drugs  Current medications and supplements including opioid prescriptions. Patient is not currently taking opioid prescriptions. Functional ability and status Nutritional status Physical activity Advanced directives List of other physicians Hospitalizations, surgeries, and ER visits in previous 12 months Vitals Screenings to include cognitive, depression, and falls Referrals  and appointments  In addition, I have reviewed and discussed with patient certain preventive protocols, quality metrics, and best practice recommendations. A written personalized care plan for preventive services as well as general preventive health recommendations were provided to patient.     Barnie GORMAN Seip, NP   07/22/2023   After Visit Summary: (MyChart) Due to this being a telephonic visit, the after visit summary with patients personalized plan was offered to patient via MyChart   Nurse Notes:

## 2023-07-26 ENCOUNTER — Other Ambulatory Visit: Payer: Self-pay | Admitting: Family

## 2023-07-26 DIAGNOSIS — E1169 Type 2 diabetes mellitus with other specified complication: Secondary | ICD-10-CM

## 2023-07-27 ENCOUNTER — Other Ambulatory Visit: Payer: Self-pay

## 2023-07-27 DIAGNOSIS — C49A0 Gastrointestinal stromal tumor, unspecified site: Secondary | ICD-10-CM

## 2023-07-27 NOTE — Assessment & Plan Note (Signed)
 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 S/p partial gastrectomy 01/14/21 by Dr. 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 No lymph nodes were evaluated.  -she has high risk of recurrence (86%) due to the large size and high grade.  -staging chest CT 02/17/21 negative for metastatic disease. 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 She is tolerating well so far with mild to moderate diarrhea, improved with Imodium. She developed worsening epigastric pain, and Gleevec was held for a month. -she restarted Gleevec at 100 mg on 05/22/21 and gradually increased to 300 mg daily in June, she is tolerating well so far  -restaging CT CAP from 12/31/2021 showed no evidence of recurrence.  Previous nodular area adjacent to the suture margin has resolved. 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 -She is clinically doing well, tolerating imatinib well, no clinical concern for recurrence. -Surveillance CT scan on October 14 2022 was negative for disease recurrence, except a 6 mm nodule at the suture line of her stomach.  She had EGD in July 2024 which was negative -Surveillance CT scan from April 02, 2023 showed stable hyperdense nodule adjacent to the greater curvature and small lung nodules, no definitive evidence of recurrence.  Will continue monitoring.

## 2023-07-28 ENCOUNTER — Inpatient Hospital Stay (HOSPITAL_BASED_OUTPATIENT_CLINIC_OR_DEPARTMENT_OTHER): Admitting: Hematology

## 2023-07-28 ENCOUNTER — Inpatient Hospital Stay: Attending: Hematology

## 2023-07-28 VITALS — BP 126/58 | HR 84 | Temp 97.3°F | Resp 15 | Ht 66.0 in | Wt 186.5 lb

## 2023-07-28 DIAGNOSIS — R7989 Other specified abnormal findings of blood chemistry: Secondary | ICD-10-CM | POA: Diagnosis not present

## 2023-07-28 DIAGNOSIS — Z79622 Long term (current) use of janus kinase inhibitor: Secondary | ICD-10-CM | POA: Insufficient documentation

## 2023-07-28 DIAGNOSIS — R319 Hematuria, unspecified: Secondary | ICD-10-CM | POA: Insufficient documentation

## 2023-07-28 DIAGNOSIS — C49A0 Gastrointestinal stromal tumor, unspecified site: Secondary | ICD-10-CM

## 2023-07-28 DIAGNOSIS — D649 Anemia, unspecified: Secondary | ICD-10-CM | POA: Diagnosis not present

## 2023-07-28 DIAGNOSIS — C49A2 Gastrointestinal stromal tumor of stomach: Secondary | ICD-10-CM | POA: Diagnosis not present

## 2023-07-28 DIAGNOSIS — M1990 Unspecified osteoarthritis, unspecified site: Secondary | ICD-10-CM | POA: Insufficient documentation

## 2023-07-28 DIAGNOSIS — Z903 Acquired absence of stomach [part of]: Secondary | ICD-10-CM | POA: Insufficient documentation

## 2023-07-28 LAB — CBC WITH DIFFERENTIAL (CANCER CENTER ONLY)
Abs Immature Granulocytes: 0.02 K/uL (ref 0.00–0.07)
Basophils Absolute: 0 K/uL (ref 0.0–0.1)
Basophils Relative: 1 %
Eosinophils Absolute: 0.1 K/uL (ref 0.0–0.5)
Eosinophils Relative: 2 %
HCT: 31.2 % — ABNORMAL LOW (ref 36.0–46.0)
Hemoglobin: 10.1 g/dL — ABNORMAL LOW (ref 12.0–15.0)
Immature Granulocytes: 0 %
Lymphocytes Relative: 24 %
Lymphs Abs: 1.9 K/uL (ref 0.7–4.0)
MCH: 30.1 pg (ref 26.0–34.0)
MCHC: 32.4 g/dL (ref 30.0–36.0)
MCV: 93.1 fL (ref 80.0–100.0)
Monocytes Absolute: 0.5 K/uL (ref 0.1–1.0)
Monocytes Relative: 7 %
Neutro Abs: 5.2 K/uL (ref 1.7–7.7)
Neutrophils Relative %: 66 %
Platelet Count: 271 K/uL (ref 150–400)
RBC: 3.35 MIL/uL — ABNORMAL LOW (ref 3.87–5.11)
RDW: 13.7 % (ref 11.5–15.5)
WBC Count: 7.8 K/uL (ref 4.0–10.5)
nRBC: 0 % (ref 0.0–0.2)

## 2023-07-28 LAB — CMP (CANCER CENTER ONLY)
ALT: 13 U/L (ref 0–44)
AST: 21 U/L (ref 15–41)
Albumin: 4 g/dL (ref 3.5–5.0)
Alkaline Phosphatase: 59 U/L (ref 38–126)
Anion gap: 3 — ABNORMAL LOW (ref 5–15)
BUN: 13 mg/dL (ref 8–23)
CO2: 28 mmol/L (ref 22–32)
Calcium: 9 mg/dL (ref 8.9–10.3)
Chloride: 106 mmol/L (ref 98–111)
Creatinine: 1.16 mg/dL — ABNORMAL HIGH (ref 0.44–1.00)
GFR, Estimated: 48 mL/min — ABNORMAL LOW (ref >60)
Glucose, Bld: 109 mg/dL — ABNORMAL HIGH (ref 70–99)
Potassium: 4.5 mmol/L (ref 3.5–5.1)
Sodium: 137 mmol/L (ref 135–145)
Total Bilirubin: 0.5 mg/dL (ref 0.0–1.2)
Total Protein: 6.9 g/dL (ref 6.5–8.1)

## 2023-07-28 MED ORDER — IMATINIB MESYLATE 100 MG PO TABS: 300.0000 mg | ORAL_TABLET | Freq: Every day | ORAL | 1 refills | Status: DC

## 2023-07-28 NOTE — Progress Notes (Signed)
 [name]   [phone] [phone]   Clinic Follow up Note   Patient Care Team: Ngetich, Roxan BROCKS, NP as PCP - General (Family Medicine) Manford, Elspeth ORN, MD as Referring Physician (Optometry) Heide Ingle, MD as Consulting Physician (Orthopedic Surgery) Octavia Bruckner, MD as Consulting Physician (Ophthalmology) Aneita Gwendlyn DASEN, MD (Inactive) as Consulting Physician (Gastroenterology) Twylla Glendia BROCKS, MD (Urology) Stechschulte, Deward PARAS, MD as Consulting Physician (Surgery) Lanny Callander, MD as Consulting Physician (Hematology)  Date of Service:  07/28/2023  CHIEF COMPLAINT: f/u of GIST  CURRENT THERAPY:  Adjuvant imatinib  300 mg daily  Oncology History   Gastrointestinal stromal tumor (GIST) (HCC) pT4NxM0, G2, 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 S/p partial gastrectomy 01/14/21 by Dr. Lyndel; path showed 12.8 cm unifocal GIST, margins negative. No lymph nodes were evaluated.  -she has high risk of recurrence (86%) due to the large size and high grade.  -staging chest CT 02/17/21 negative for metastatic disease. -KIT mutation was detected on surgical sample, which indicates she should have a good response to Imatinib   -she began Gleevec  400 mg on 03/09/21. She is tolerating well so far with mild to moderate diarrhea, improved with Imodium. She developed worsening epigastric pain, and Gleevec  was held for a month. -she restarted Gleevec  at 100 mg on 05/22/21 and gradually increased to 300 mg daily in June, she is tolerating well so far  -restaging CT CAP from 12/31/2021 showed no evidence of recurrence.  Previous nodular area adjacent to the suture margin has resolved. 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 -She is clinically doing well, tolerating imatinib  well, no clinical concern for recurrence. -Surveillance CT scan  on October 14 2022 was negative for disease recurrence, except a 6 mm nodule at the suture line of her stomach.  She had EGD in July 2024 which was negative -Surveillance CT scan from April 02, 2023 showed stable hyperdense nodule adjacent to the greater curvature and small lung nodules, no definitive evidence of recurrence.  Will continue monitoring.     Assessment & Plan Gastric GIST Currently on Gleevec  since February 2023 with no reported issues. Treatment is planned for three years, concluding in February 2026. Regular monitoring with CT scans every six months is ongoing. - Continue Gleevec  300 mg, three tablets daily. - Schedule CT scan for early October, one week prior to next appointment. - Schedule follow-up appointment in three months.  Hematuria Persistent hematuria with no identifiable source on recent cystoscopy. The etiology remains unclear despite urological evaluation.  Mild anemia Mild anemia noted on current labs. No need for blood transfusion at this time.  Elevated creatinine Slightly elevated creatinine indicating mild renal impairment. Kidney function is being monitored, especially in relation to the use of contrast for CT scans. - Monitor kidney function closely, especially prior to CT scans with contrast. - Avoid contrast in CT scans if kidney function worsens.  Arthritis Arthritis present, but no recent falls or significant mobility issues reported. She remains independent in activities of daily living.  Plan - Patient is clinically doing well, continue imatinib  300 mg daily - Follow-up in 3 months with lab and CT chest, abdomen pelvis with contrast 1 week before   SUMMARY OF ONCOLOGIC HISTORY: Oncology History   No history exists.     Discussed the use of AI scribe software for clinical note transcription with the patient, who gave verbal consent to  proceed.  History of Present Illness Alison [name] is a 78 year old female who presents with ongoing  hematuria. She is accompanied by her sister.  She experiences persistent hematuria, unresolved despite a scope procedure, with the bleeding source unidentified. She has been on Gleevec  since February 2023 for Chronic Myeloid Leukemia, taking three tablets daily. Her daughter manages medication procurement from [employer], costing approximately thirty dollars monthly. She lives with her daughter and remains independent in daily activities, including shopping. No recent falls, stomach pain, or tenderness. She has arthritis but does not elaborate on its impact.     All other systems were reviewed with the patient and are negative.  MEDICAL HISTORY:  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   Goiter, specified as simple    Helicobacter pylori gastritis    Hyperlipidemia LDL goal < 100    Intrahepatic bile duct dilation    Numbness    TOES RT FOOT and toes left foot   Obesity, unspecified    Osteoarthrosis, unspecified whether generalized or localized, lower leg    Osteopenia    Other specified disease of nail    FUNGUS OF FINGERNAILS   Pernicious anemia    Unspecified glaucoma(365.9)    Unspecified vitamin D  deficiency    Wears dentures    Wears glasses     SURGICAL HISTORY: Past Surgical History:  Procedure Laterality Date   BACK SURGERY     CATARACT EXTRACTION     right eye 06/20/15 left eye 09/12/15   COLECTOMY  2009   COLONOSCOPY  09/06/2007   Dr Belvie Just   DILATION AND CURETTAGE OF UTERUS     ENDOSCOPIC RETROGRADE CHOLANGIOPANCREATOGRAPHY (ERCP) WITH PROPOFOL  N/A 03/17/2019   Procedure: ENDOSCOPIC RETROGRADE CHOLANGIOPANCREATOGRAPHY (ERCP) WITH PROPOFOL ;  Surgeon: Aneita Gwendlyn DASEN, MD;  Location[facility] ENDOSCOPY;  Service: Endoscopy;  Laterality: N/A;   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   JOINT REPLACEMENT     right   LUMBAR LAMINECTOMY/DECOMPRESSION MICRODISCECTOMY N/A 03/30/2013   Procedure: CENTRAL DECOMPRESSION L4 - L5 AND EXCISION 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;  Surgeon: Tanda DELENA Heading, MD;  Location: [facility] ORS;  Service: Orthopedics;  Laterality: N/A;   MULTIPLE TOOTH EXTRACTIONS     REMOVAL OF STONES  03/17/2019   Procedure: REMOVAL OF STONES;  Surgeon: Aneita Gwendlyn DASEN, MD;  Location[facility] ENDOSCOPY;  Service: Endoscopy;;   SIGMOIDOSCOPY  02/16/2018   Dr Aneita   SPHINCTEROTOMY  03/17/2019   Procedure: SPHINCTEROTOMY;  Surgeon: Aneita Gwendlyn DASEN, MD;  Location: [facility] ENDOSCOPY;  Service: Endoscopy;;   TOTAL KNEE ARTHROPLASTY Right 02/01/2013   Procedure: RIGHT TOTAL KNEE ARTHROPLASTY;  Surgeon: Tanda DELENA Heading, MD;  Location: [facility];  Service: Orthopedics;  Laterality: Right;   TOTAL KNEE ARTHROPLASTY Left 04/18/2014   Procedure: LEFT TOTAL KNEE ARTHROPLASTY;  Surgeon: Tanda Heading, MD;  Location: [facility];  Service: Orthopedics;  Laterality: Left;    I have reviewed the social history and family history with the patient and they are unchanged from previous note.  ALLERGIES:  is allergic to aspirin .  MEDICATIONS:  Current Outpatient Medications  Medication Sig Dispense Refill   acetaminophen  (TYLENOL ) 650 MG CR tablet  Take 1,300 mg by mouth as needed.     amLODipine  (NORVASC ) 10 MG tablet Take 1 tablet by mouth once daily 90 tablet 1   atorvastatin  (LIPITOR) 40 MG tablet TAKE 1 TABLET BY MOUTH ONCE DAILY AT 6 IN THE EVENING 90 tablet 1   brimonidine (ALPHAGAN) 0.2 % ophthalmic solution Place 1 drop into both eyes 2 (two) times daily.     Calcium  Carb-Cholecalciferol (CALCIUM  600 + D PO) Take 1 tablet by mouth in the morning.     carboxymethylcellulose (REFRESH PLUS) 0.5 % SOLN Place 1 drop into both eyes 3 (three) times daily as needed (dry  eyes).      CELEBREX  200 MG capsule 200 mg daily. Patient takes 1 tab every other day     Cholecalciferol (VITAMIN D -3) 1000 UNITS CAPS Take 1,000 Units by mouth in the morning.     diclofenac Sodium (VOLTAREN) 1 % GEL Apply 2 g topically as needed.     dorzolamide -timolol  (COSOPT ) 22.3-6.8 MG/ML ophthalmic solution Place 1 drop into both eyes 2 (two) times daily.     Ferrous Sulfate  (IRON ) 325 (65 Fe) MG TABS Take 1 tablet by mouth once daily 90 tablet 0   gabapentin  (NEURONTIN ) 300 MG capsule Take 1 capsule by mouth at bedtime 90 capsule 1   imatinib  (GLEEVEC ) 100 MG tablet Take 3 tablets (300 mg total) by mouth daily. Take with meals and large glass of water.Caution:Chemotherapy 270 tablet 1   lisinopril  (ZESTRIL ) 20 MG tablet Take 1 tablet (20 mg total) by mouth daily. 90 tablet 1   loperamide (IMODIUM A-D) 2 MG tablet Take 1 mg by mouth daily as needed.     Multiple Vitamin (MULTIVITAMIN WITH MINERALS) TABS tablet Take 1 tablet by mouth daily.     nystatin  cream (MYCOSTATIN ) Apply 1 Application topically 2 (two) times daily. 30 g 3   Omega-3 Fatty Acids (OMEGA 3 PO) Take 1,200 mg by mouth in the morning.     ondansetron  (ZOFRAN ) 8 MG tablet Take 1 tablet (8 mg total) by mouth every 8 (eight) hours as needed for nausea or vomiting. 20 tablet 1   pantoprazole  (PROTONIX ) 40 MG tablet Take 1 tablet (40 mg total) by mouth daily. 90 tablet 5   ROCKLATAN 0.02-0.005 % SOLN Apply 1 drop to eye at bedtime.     shark liver oil-cocoa butter (PREPARATION H) 0.25-88.44 % suppository Place 1 suppository rectally as needed for hemorrhoids.     Zinc Oxide (DESITIN EX) Apply 1 application topically as needed.     No current facility-administered medications for this visit.    PHYSICAL EXAMINATION: ECOG PERFORMANCE STATUS: 1 - Symptomatic but completely ambulatory  Vitals:   07/28/23 1054  BP: (!) 126/58  Pulse: 84  Resp: 15  Temp: (!) 97.3 F (36.3 C)  SpO2: 99%   Wt Readings from Last 3  Encounters:  07/28/23 186 lb 8 oz (84.6 kg)  07/22/23 184 lb (83.5 kg)  04/21/23 183 lb 12.8 oz (83.4 kg)     GENERAL:alert, no distress and comfortable SKIN: skin color, texture, turgor are normal, no rashes or significant lesions EYES: normal, Conjunctiva are pink and non-injected, sclera clear NECK: supple, thyroid  normal size, non-tender, without nodularity LYMPH:  no palpable lymphadenopathy in the cervical, axillary  LUNGS: clear to auscultation and percussion with normal breathing effort HEART: regular rate & rhythm and no murmurs and no lower extremity edema ABDOMEN:abdomen soft, non-tender and normal bowel sounds Musculoskeletal:no cyanosis of digits and no clubbing  NEURO: alert & oriented x 3 with fluent speech, no focal motor/sensory deficits  Physical Exam ABDOMEN: Liver not enlarged, non-tender.  LABORATORY DATA:  I have reviewed the data as listed    Latest Ref Rng & Units 07/28/2023   10:27 AM 04/21/2023   11:37 AM 03/15/2023    8:33 AM  CBC  WBC 4.0 - 10.5 K/uL 7.8  8.0  6.8   Hemoglobin 12.0 - 15.0 g/dL 89.8  89.2  89.1   Hematocrit 36.0 - 46.0 % 31.2  32.8  34.5   Platelets 150 - 400 K/uL 271  275  302         Latest Ref Rng & Units 07/28/2023   10:27 AM 04/21/2023   11:37 AM 03/15/2023    8:33 AM  CMP  Glucose 70 - 99 mg/dL 890  895  99   BUN 8 - 23 mg/dL 13  16  16    Creatinine 0.44 - 1.00 mg/dL 8.83  8.77  8.89   Sodium 135 - 145 mmol/L 137  136  136   Potassium 3.5 - 5.1 mmol/L 4.5  4.3  4.5   Chloride 98 - 111 mmol/L 106  104  104   CO2 22 - 32 mmol/L 28  27  26    Calcium  8.9 - 10.3 mg/dL 9.0  9.3  9.4   Total Protein 6.5 - 8.1 g/dL 6.9  7.2  7.0   Total Bilirubin 0.0 - 1.2 mg/dL 0.5  0.6  0.5   Alkaline Phos 38 - 126 U/L 59  67    AST 15 - 41 U/L 21  22  22    ALT 0 - 44 U/L 13  13  14        RADIOGRAPHIC STUDIES: I have personally reviewed the radiological images as listed and agreed with the findings in the report. No results found.     Orders Placed This Encounter  Procedures   CT CHEST ABDOMEN PELVIS W CONTRAST    Hold IV contrast if EGFR<50    Standing Status:   Future    Expected Date:   10/21/2023    Expiration Date:   07/27/2024    If indicated for the ordered procedure, I authorize the administration of contrast media per Radiology protocol:   Yes    Does the patient have a contrast media/X-ray dye allergy?:   No    Preferred imaging location?:   [facility]    If indicated for the ordered procedure, I authorize the administration of oral contrast media per Radiology protocol:   Yes   All questions were answered. The patient knows to call the clinic with any problems, questions or concerns. No barriers to learning was detected. The total time spent in the appointment was 25 minutes, including review of chart and various tests results, discussions about plan of care and coordination of care plan     Onita Mattock, MD 07/28/2023

## 2023-08-02 DIAGNOSIS — H35033 Hypertensive retinopathy, bilateral: Secondary | ICD-10-CM | POA: Diagnosis not present

## 2023-08-02 DIAGNOSIS — H26493 Other secondary cataract, bilateral: Secondary | ICD-10-CM | POA: Diagnosis not present

## 2023-08-02 DIAGNOSIS — Z961 Presence of intraocular lens: Secondary | ICD-10-CM | POA: Diagnosis not present

## 2023-08-02 DIAGNOSIS — H43813 Vitreous degeneration, bilateral: Secondary | ICD-10-CM | POA: Diagnosis not present

## 2023-08-02 DIAGNOSIS — H401131 Primary open-angle glaucoma, bilateral, mild stage: Secondary | ICD-10-CM | POA: Diagnosis not present

## 2023-08-12 DIAGNOSIS — H401111 Primary open-angle glaucoma, right eye, mild stage: Secondary | ICD-10-CM | POA: Diagnosis not present

## 2023-08-24 ENCOUNTER — Ambulatory Visit (INDEPENDENT_AMBULATORY_CARE_PROVIDER_SITE_OTHER): Admitting: Podiatry

## 2023-08-24 ENCOUNTER — Other Ambulatory Visit: Payer: Self-pay | Admitting: Family

## 2023-08-24 ENCOUNTER — Encounter: Payer: Self-pay | Admitting: Podiatry

## 2023-08-24 DIAGNOSIS — M79675 Pain in left toe(s): Secondary | ICD-10-CM | POA: Diagnosis not present

## 2023-08-24 DIAGNOSIS — E1142 Type 2 diabetes mellitus with diabetic polyneuropathy: Secondary | ICD-10-CM | POA: Diagnosis not present

## 2023-08-24 DIAGNOSIS — M79674 Pain in right toe(s): Secondary | ICD-10-CM | POA: Diagnosis not present

## 2023-08-24 DIAGNOSIS — B351 Tinea unguium: Secondary | ICD-10-CM | POA: Diagnosis not present

## 2023-08-24 DIAGNOSIS — D509 Iron deficiency anemia, unspecified: Secondary | ICD-10-CM

## 2023-08-24 NOTE — Progress Notes (Signed)
  Subjective:  Patient ID: Alison [name], female    DOB: [birth year],  MRN: [id number]  Alison [name] presents to clinic today for at risk foot care with history of diabetic neuropathy and painful thick toenails that are difficult to trim. Pain interferes with ambulation. Aggravating factors include wearing enclosed shoe gear. Pain is relieved with periodic professional debridement.  Chief Complaint  Patient presents with   RFc   New problem(s): None.   PCP is Ngetich, Dinah C, NP. LOV 03/18/2023.  Allergies  Allergen Reactions   Aspirin  Nausea Only and Nausea And Vomiting    Can take 81 mg but can't take 325mg , hard on stomach. Can take 81 mg but can't take 325mg , hard on stomach.    Review of Systems: Negative except as noted in the HPI.  Objective: No changes noted in today's physical examination. There were no vitals filed for this visit. Alison [name] is a pleasant 78 y.o. female in NAD. AAO x 3.  Vascular Examination: CFT <3 seconds b/l. DP/PT pulses faintly palpable b/l. Digital hair absent.  Skin temperature gradient warm to warm b/l. No pain with calf compression. No ischemia or gangrene. No cyanosis or clubbing noted b/l.    Neurological Examination: Pt has subjective symptoms of neuropathy. Protective sensation decreased with 10 gram monofilament b/l.  Dermatological Examination: Pedal skin warm and supple b/l.   No open wounds. No interdigital macerations.  Toenails 1-5 b/l thick, discolored, elongated with subungual debris and pain on dorsal palpation.    No hyperkeratotic nor porokeratotic lesions.  Musculoskeletal Examination: Muscle strength 5/5 to all lower extremity muscle groups bilaterally. HAV with bunion deformity noted b/l LE. Hammertoe(s) 2-5 b/l.SABRA No pain, crepitus or joint limitation noted with ROM b/l LE.  Patient ambulates independently without assistive aids.  Radiographs: None  Last A1c:      Latest Ref Rng & Units 03/15/2023    8:33 AM   Hemoglobin A1C  Hemoglobin-A1c <5.7 % of total Hgb 5.6    Assessment/Plan: No diagnosis found.  Patient was evaluated and treated. All patient's and/or POA's questions/concerns addressed on today's visit. 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  Continue daily foot inspections and monitor blood glucose per PCP/Endocrinologist's recommendations.Continue soft, supportive shoe gear daily. Report any pedal injuries to medical professional. Call office if there are any quesitons/concerns. -Patient/POA to call should there be question/concern in the interim.   Return in about 3 months (around 11/24/2023).  Delon LITTIE Merlin, DPM      [facility] LOCATION: [address] [place] [address]                   Office [phone]   [facility] LOCATION: [address] [place], [place] [address] Office [phone]

## 2023-09-05 ENCOUNTER — Other Ambulatory Visit: Payer: Self-pay | Admitting: Family

## 2023-09-05 DIAGNOSIS — I10 Essential (primary) hypertension: Secondary | ICD-10-CM

## 2023-09-07 DIAGNOSIS — Z1231 Encounter for screening mammogram for malignant neoplasm of breast: Secondary | ICD-10-CM | POA: Diagnosis not present

## 2023-09-07 LAB — HM MAMMOGRAPHY

## 2023-09-09 ENCOUNTER — Other Ambulatory Visit: Payer: Self-pay | Admitting: Family

## 2023-09-09 DIAGNOSIS — I10 Essential (primary) hypertension: Secondary | ICD-10-CM

## 2023-09-09 DIAGNOSIS — E1169 Type 2 diabetes mellitus with other specified complication: Secondary | ICD-10-CM

## 2023-09-09 DIAGNOSIS — E1149 Type 2 diabetes mellitus with other diabetic neurological complication: Secondary | ICD-10-CM

## 2023-09-10 ENCOUNTER — Encounter: Payer: Self-pay | Admitting: Family

## 2023-09-10 ENCOUNTER — Other Ambulatory Visit: Payer: Self-pay

## 2023-09-10 DIAGNOSIS — E1149 Type 2 diabetes mellitus with other diabetic neurological complication: Secondary | ICD-10-CM

## 2023-09-10 DIAGNOSIS — E1169 Type 2 diabetes mellitus with other specified complication: Secondary | ICD-10-CM

## 2023-09-10 DIAGNOSIS — I10 Essential (primary) hypertension: Secondary | ICD-10-CM

## 2023-09-16 ENCOUNTER — Other Ambulatory Visit

## 2023-09-16 DIAGNOSIS — I10 Essential (primary) hypertension: Secondary | ICD-10-CM | POA: Diagnosis not present

## 2023-09-16 DIAGNOSIS — E785 Hyperlipidemia, unspecified: Secondary | ICD-10-CM | POA: Diagnosis not present

## 2023-09-16 DIAGNOSIS — E1149 Type 2 diabetes mellitus with other diabetic neurological complication: Secondary | ICD-10-CM | POA: Diagnosis not present

## 2023-09-16 DIAGNOSIS — E1169 Type 2 diabetes mellitus with other specified complication: Secondary | ICD-10-CM | POA: Diagnosis not present

## 2023-09-17 ENCOUNTER — Ambulatory Visit: Payer: Self-pay | Admitting: Family

## 2023-09-17 LAB — LIPID PANEL
Cholesterol: 104 mg/dL (ref <200)
HDL: 47 mg/dL — ABNORMAL LOW (ref >=50)
LDL Cholesterol (Calc): 42 mg/dL
Non-HDL Cholesterol (Calc): 57 mg/dL (ref <130)
Total CHOL/HDL Ratio: 2.2 (calc) (ref <5.0)
Triglycerides: 66 mg/dL (ref <150)

## 2023-09-17 LAB — TSH: TSH: 0.63 m[IU]/L (ref 0.40–4.50)

## 2023-09-17 LAB — HEMOGLOBIN A1C
Hgb A1c MFr Bld: 5.5 % (ref <5.7)
Mean Plasma Glucose: 111 mg/dL
eAG (mmol/L): 6.2 mmol/L

## 2023-09-20 ENCOUNTER — Encounter: Payer: Self-pay | Admitting: Family

## 2023-09-20 ENCOUNTER — Ambulatory Visit: Admitting: Family

## 2023-09-20 VITALS — BP 128/66 | HR 77 | Temp 97.8°F | Resp 18 | Ht 66.0 in | Wt 187.2 lb

## 2023-09-20 DIAGNOSIS — I10 Essential (primary) hypertension: Secondary | ICD-10-CM

## 2023-09-20 DIAGNOSIS — E1149 Type 2 diabetes mellitus with other diabetic neurological complication: Secondary | ICD-10-CM

## 2023-09-20 DIAGNOSIS — N1832 Chronic kidney disease, stage 3b: Secondary | ICD-10-CM | POA: Diagnosis not present

## 2023-09-20 DIAGNOSIS — M150 Primary generalized (osteo)arthritis: Secondary | ICD-10-CM | POA: Diagnosis not present

## 2023-09-20 DIAGNOSIS — Z23 Encounter for immunization: Secondary | ICD-10-CM

## 2023-09-20 DIAGNOSIS — H4010X0 Unspecified open-angle glaucoma, stage unspecified: Secondary | ICD-10-CM | POA: Diagnosis not present

## 2023-09-20 DIAGNOSIS — D509 Iron deficiency anemia, unspecified: Secondary | ICD-10-CM

## 2023-09-20 DIAGNOSIS — Z8719 Personal history of other diseases of the digestive system: Secondary | ICD-10-CM | POA: Diagnosis not present

## 2023-09-20 DIAGNOSIS — B3731 Acute candidiasis of vulva and vagina: Secondary | ICD-10-CM | POA: Diagnosis not present

## 2023-09-20 DIAGNOSIS — E785 Hyperlipidemia, unspecified: Secondary | ICD-10-CM

## 2023-09-20 DIAGNOSIS — Z9889 Other specified postprocedural states: Secondary | ICD-10-CM | POA: Diagnosis not present

## 2023-09-20 MED ORDER — COVID-19 MRNA VAC-TRIS(PFIZER) 30 MCG/0.3ML IM SUSY: 0.3000 mL | PREFILLED_SYRINGE | INTRAMUSCULAR | 1 refills | Status: AC

## 2023-09-20 MED ORDER — NYSTATIN-TRIAMCINOLONE 100000-0.1 UNIT/GM-% EX OINT: 1.0000 | TOPICAL_OINTMENT | Freq: Two times a day (BID) | CUTANEOUS | 0 refills | Status: AC

## 2023-09-20 NOTE — Progress Notes (Signed)
 Provider: Roxan Plough FNP-C   Alison [name], Alison BROCKS, NP  Patient Care Team: Katheen Aslin, Alison BROCKS, NP as PCP - General (Family Medicine) Manford Elspeth ORN, MD as Referring Physician (Optometry) Heide Ingle, MD as Consulting Physician (Orthopedic Surgery) Octavia Bruckner, MD as Consulting Physician (Ophthalmology) Aneita Gwendlyn DASEN, MD (Inactive) as Consulting Physician (Gastroenterology) Twylla Glendia BROCKS, MD (Urology) Stechschulte, Deward PARAS, MD as Consulting Physician (Surgery) Lanny Callander, MD as Consulting Physician (Hematology)  Extended Emergency Contact Information Primary Emergency Contact: Ambler,[name] Home Phone: [phone] Mobile Phone: [phone] Relation: Daughter Secondary Emergency Contact: [place], [place] [address] [place] Home Phone: [phone] Mobile Phone: [phone] Relation: Sister  Code Status:  Full Code  Goals of care: Advanced Directive information    07/22/2023   10:51 AM  Advanced Directives  Does Patient Have a Medical Advance Directive? No  Would patient like information on creating a medical advance directive? No - Patient declined     Chief Complaint  Patient presents with   Medical Management of Chronic Issues    6 Month follow up & patient received Flu vaccine today.Patient will go to pharmacy to receive Tdap & covid vaccine.      Discussed the use of AI scribe software for clinical note transcription with the patient, who gave verbal consent to proceed.  History of Present Illness   [name] is a 78 year old female who presents for a six-month follow-up visit after eye surgery.  She underwent eye surgery on her right eye on September 10, 2023, with stent placement due to high intraocular pressure. Her vision is currently good, but she experiences a slight issue in the right eye. She continues to use Refresh eye drops as needed and Afghan 0.2% eye drops.  She experiences a sensation on her  labia, which she treats with desitin and previously used nystatin  cream. She prefers the ointment form as it adheres better.  She experiences loose stools that are thicker than water and uses Imodium as needed. She has a history of a hernia, which occasionally causes discomfort, but her bowel movements are regular.  Her current medications include vitamin D , calcium , omega-3 fatty acids, Tylenol  650 mg as needed, multivitamins, zinc oxide (Desitin), Diclofenac gel for pain, lisinopril  20 mg, gabapentin  at bedtime, Protonix  for acid reflux, atorvastatin  40 mg, Gleevec  300 mg, ferrous sulfate , and amlodipine  10 mg. She no longer takes Celebrex  due to kidney concerns.  She reports neuropathy symptoms, including hot and cold sensations in her feet, and uses a cane for stability when outside. She experiences arthritis pain in her hands and takes Tylenol  at night for relief.  Her recent lab work shows stable cholesterol levels, with total cholesterol at 104, triglycerides at 66, LDL at 42, and HDL at 47. Her A1c is 5.5, indicating well-controlled blood sugar levels. Her hemoglobin is 10.1, and she continues to take iron  supplements. Her creatinine level was 1.16 in July, slightly above the desired range.   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   Goiter, specified as simple    Helicobacter pylori gastritis    Hyperlipidemia LDL goal < 100  Intrahepatic bile duct dilation    Numbness    TOES RT FOOT and toes left foot   Obesity, unspecified    Osteoarthrosis, unspecified whether generalized or localized, lower leg    Osteopenia    Other specified disease of nail    FUNGUS OF FINGERNAILS   Pernicious  anemia    Unspecified glaucoma(365.9)    Unspecified vitamin D  deficiency    Wears dentures    Wears glasses    Past Surgical History:  Procedure Laterality Date   BACK SURGERY     CATARACT EXTRACTION     right eye 06/20/15 left eye 09/12/15   COLECTOMY  2009   COLONOSCOPY  09/06/2007   Dr Belvie Just   DILATION AND CURETTAGE OF UTERUS     ENDOSCOPIC RETROGRADE CHOLANGIOPANCREATOGRAPHY (ERCP) WITH PROPOFOL  N/A 03/17/2019   Procedure: ENDOSCOPIC RETROGRADE CHOLANGIOPANCREATOGRAPHY (ERCP) WITH PROPOFOL ;  Surgeon: Aneita Gwendlyn DASEN, MD;  Location: [facility] ENDOSCOPY;  Service: Endoscopy;  Laterality: N/A;   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   JOINT REPLACEMENT     right   LUMBAR LAMINECTOMY/DECOMPRESSION MICRODISCECTOMY N/A 03/30/2013   Procedure: CENTRAL DECOMPRESSION L4 - L5 AND EXCISION 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;  Surgeon: Tanda DELENA Heading, MD;  Location: [facility];  Service: Orthopedics;  Laterality: N/A;   MULTIPLE TOOTH EXTRACTIONS     REMOVAL OF STONES  03/17/2019   Procedure: REMOVAL OF STONES;  Surgeon: Aneita Gwendlyn DASEN, MD;  Location: [facility] ENDOSCOPY;  Service: Endoscopy;;   SIGMOIDOSCOPY  02/16/2018   Dr Aneita   SPHINCTEROTOMY  03/17/2019   Procedure: SPHINCTEROTOMY;  Surgeon: Aneita Gwendlyn DASEN, MD;  Location: [facility] ENDOSCOPY;  Service: Endoscopy;;   TOTAL KNEE ARTHROPLASTY Right 02/01/2013   Procedure: RIGHT TOTAL KNEE ARTHROPLASTY;  Surgeon: Tanda DELENA Heading, MD;  Location: [facility];  Service: Orthopedics;  Laterality: Right;   TOTAL KNEE ARTHROPLASTY Left 04/18/2014   Procedure: LEFT TOTAL KNEE ARTHROPLASTY;  Surgeon: Tanda Heading, MD;  Location: [facility];  Service: Orthopedics;  Laterality: Left;    Allergies  Allergen Reactions   Aspirin  Nausea Only and Nausea And Vomiting    Can take 81 mg but can't take 325mg , hard on stomach. Can take 81 mg but can't take 325mg , hard on stomach.    Allergies as of 09/20/2023       Reactions   Aspirin  Nausea Only, Nausea And  Vomiting   Can take 81 mg but can't take 325mg , hard on stomach. Can take 81 mg but can't take 325mg , hard on stomach.        Medication List        Accurate as of September 20, 2023  9:14 PM. If you have any questions, ask your nurse or doctor.          STOP taking these medications    CeleBREX  200 MG capsule Generic drug: celecoxib  Stopped by: Fawne Hughley C Avaree Gilberti   nystatin  cream Commonly known as: MYCOSTATIN  Stopped by: Shulamit Donofrio C Markise Haymer       TAKE these medications    acetaminophen  650 MG CR tablet Commonly known as: TYLENOL  Take 1,300 mg by mouth as needed.   amLODipine  10 MG tablet Commonly known as: NORVASC  Take 1 tablet by mouth once daily   atorvastatin  40 MG tablet Commonly known as: LIPITOR TAKE 1 TABLET BY MOUTH ONCE DAILY AT 6 IN THE EVENING   brimonidine 0.2 % ophthalmic solution Commonly known as: ALPHAGAN Place 1 drop into both eyes 2 (two) times daily.  CALCIUM  600 + D PO Take 1 tablet by mouth in the morning.   carboxymethylcellulose 0.5 % Soln Commonly known as: REFRESH PLUS Place 1 drop into both eyes 3 (three) times daily as needed (dry eyes).   COVID-19 mRNA vaccine (Pfizer) syringe Commonly known as: COMIRNATY Inject 0.3 mLs into the muscle as directed. Started by: Kahlen Morais C Neythan Kozlov   DESITIN EX Apply 1 application topically as needed.   dorzolamide -timolol  2-0.5 % ophthalmic solution Commonly known as: COSOPT  Place 1 drop into both eyes 2 (two) times daily.   gabapentin  300 MG capsule Commonly known as: NEURONTIN  Take 1 capsule by mouth at bedtime   imatinib  100 MG tablet Commonly known as: GLEEVEC  Take 3 tablets (300 mg total) by mouth daily. Take with meals and large glass of water.Caution:Chemotherapy   Iron  325 (65 Fe) MG Tabs Take 1 tablet by mouth once daily   lisinopril  20 MG tablet Commonly known as: ZESTRIL  Take 1 tablet (20 mg total) by mouth daily.   loperamide 2 MG tablet Commonly known as: IMODIUM  A-D Take 1 mg by mouth daily as needed.   multivitamin with minerals Tabs tablet Take 1 tablet by mouth daily.   nystatin -triamcinolone  ointment Commonly known as: MYCOLOG Apply 1 Application topically 2 (two) times daily. Started by: Isabel Freese C Chalise Pe   OMEGA 3 PO Take 1,200 mg by mouth in the morning.   ondansetron  8 MG tablet Commonly known as: ZOFRAN  Take 1 tablet (8 mg total) by mouth every 8 (eight) hours as needed for nausea or vomiting.   pantoprazole  40 MG tablet Commonly known as: PROTONIX  Take 1 tablet (40 mg total) by mouth daily.   Preparation H 0.25-88.44 % suppository Generic drug: shark liver oil-cocoa butter Place 1 suppository rectally as needed for hemorrhoids.   Rocklatan 0.02-0.005 % Soln Generic drug: Netarsudil-Latanoprost  Apply 1 drop to eye at bedtime.   Vitamin D -3 25 MCG (1000 UT) Caps Take 1,000 Units by mouth in the morning.   Voltaren 1 % Gel Generic drug: diclofenac Sodium Apply 2 g topically as needed.        Review of Systems  Constitutional:  Negative for appetite change, chills, fatigue, fever and unexpected weight change.  HENT:  Negative for congestion, dental problem, ear discharge, ear pain, facial swelling, hearing loss, nosebleeds, postnasal drip, rhinorrhea, sinus pressure, sinus pain, sneezing, sore throat, tinnitus and trouble swallowing.   Eyes:  Negative for pain, discharge, redness, itching and visual disturbance.  Respiratory:  Negative for cough, chest tightness, shortness of breath and wheezing.   Cardiovascular:  Negative for chest pain, palpitations and leg swelling.  Gastrointestinal:  Negative for abdominal distention, abdominal pain, blood in stool, constipation, diarrhea, nausea and vomiting.  Endocrine: Negative for cold intolerance, heat intolerance, polydipsia, polyphagia and polyuria.  Genitourinary:  Negative for difficulty urinating, dysuria, flank pain, frequency and urgency.  Musculoskeletal:  Positive for  arthralgias and gait problem. Negative for back pain, joint swelling, myalgias, neck pain and neck stiffness.  Skin:  Negative for color change, pallor, rash and wound.       Itching on labia   Neurological:  Negative for dizziness, syncope, speech difficulty, weakness, light-headedness, numbness and headaches.  Hematological:  Does not bruise/bleed easily.  Psychiatric/Behavioral:  Negative for agitation, behavioral problems, confusion, hallucinations, self-injury, sleep disturbance and suicidal ideas. The patient is not nervous/anxious.     Immunization History  Administered Date(s) Administered   Fluad Quad(high Dose 65+) 09/05/2018, 09/26/2020, 10/01/2021, 10/02/2021   Fluad Trivalent(High Dose  65+) 09/15/2022   INFLUENZA, HIGH DOSE SEASONAL PF 10/02/2017, 11/20/2019, 09/20/2023   Influenza,inj,Quad PF,6+ Mos 10/03/2012, 09/21/2014, 11/07/2015   Influenza,inj,quad, With Preservative 11/12/2016   Influenza-Unspecified 10/28/2013   Moderna Covid-19 Fall Seasonal Vaccine 44yrs & older 09/15/2022   PFIZER(Purple Top)SARS-COV-2 Vaccination 03/25/2019, 03/25/2019, 04/18/2019, 11/01/2019, 05/23/2020, 10/07/2020   Pneumococcal Conjugate-13 01/04/2014, 01/04/2014, 11/04/2016   Pneumococcal Polysaccharide-23 12/29/2012, 11/19/2017   Respiratory Syncytial Virus Vaccine,Recomb Aduvanted(Arexvy) 08/07/2022   Zoster Recombinant(Shingrix) 11/25/2016, 01/26/2017   Pertinent  Health Maintenance Due  Topic Date Due   Colonoscopy  11/30/2023 (Originally 02/17/2023)   OPHTHALMOLOGY EXAM  02/16/2024   HEMOGLOBIN A1C  03/15/2024   FOOT EXAM  07/21/2024   Influenza Vaccine  Completed   DEXA SCAN  Completed      04/01/2022   12:51 PM 09/15/2022    8:50 AM 03/18/2023   11:03 AM 07/22/2023   10:53 AM 07/22/2023   11:13 AM  Fall Risk  Falls in the past year? 0 0 0 0 0  Was there an injury with Fall? 0  0 0 0  Fall Risk Category Calculator 0  0 0 0  Patient at Risk for Falls Due to No Fall Risks  No Fall  Risks No Fall Risks   Fall risk Follow up Falls evaluation completed  Falls evaluation completed Falls evaluation completed    Functional Status Survey:    Vitals:   09/20/23 1303  BP: 128/66  Pulse: 77  Resp: 18  Temp: 97.8 F (36.6 C)  SpO2: 98%  Weight: 187 lb 3.2 oz (84.9 kg)  Height: 5' 6 (1.676 m)   Body mass index is 30.21 kg/m. Physical Exam  VITALS: T- 97.8, P- 77, BP- 128/66, SaO2- 98% MEASUREMENTS: Weight- 187.2. GENERAL: Alert, cooperative, well developed, no acute distress HEENT: Normocephalic, normal oropharynx, moist mucous membranes, extraocular movements intact, ears and nose normal CHEST: Clear to auscultation bilaterally, no wheezes, rhonchi, or crackles CARDIOVASCULAR: Normal heart rate and rhythm, S1 and S2 normal without murmurs ABDOMEN: Soft, non-tender, non-distended, without organomegaly, normal bowel sounds EXTREMITIES: No cyanosis or edema NEUROLOGICAL: Cranial nerves grossly intact, moves all extremities without gross motor or sensory deficit  SKIN: No rash,no lesion or erythema   PSYCHIATRY/BEHAVIORAL: Mood stable    Labs reviewed: Recent Labs    03/15/23 0833 04/21/23 1137 07/28/23 1027  NA 136 136 137  K 4.5 4.3 4.5  CL 104 104 106  CO2 26 27 28   GLUCOSE 99 104* 109*  BUN 16 16 13   CREATININE 1.10* 1.22* 1.16*  CALCIUM  9.4 9.3 9.0   Recent Labs    01/20/23 1050 03/15/23 0833 04/21/23 1137 07/28/23 1027  AST 23 22 22 21   ALT 14 14 13 13   ALKPHOS 69  --  67 59  BILITOT 0.6 0.5 0.6 0.5  PROT 7.4 7.0 7.2 6.9  ALBUMIN 4.3  --  4.3 4.0   Recent Labs    03/15/23 0833 04/21/23 1137 07/28/23 1027  WBC 6.8 8.0 7.8  NEUTROABS 4,311 5.0 5.2  HGB 10.8* 10.7* 10.1*  HCT 34.5* 32.8* 31.2*  MCV 95.3 94.0 93.1  PLT 302 275 271   Lab Results  Component Value Date   TSH 0.63 09/16/2023   Lab Results  Component Value Date   HGBA1C 5.5 09/16/2023   Lab Results  Component Value Date   CHOL 104 09/16/2023   HDL 47 (L)  09/16/2023   LDLCALC 42 09/16/2023   TRIG 66 09/16/2023   CHOLHDL 2.2 09/16/2023  Significant Diagnostic Results in last 30 days:  No results found.  Assessment/Plan     Type 2 diabetes mellitus with diabetic neuropathy Diabetes is well-controlled with an A1c of 5.5, indicating non-diabetic levels. Neuropathy symptoms include hot and cold sensations in the feet, likely due to previous diabetes. Continued control of blood sugar is essential to prevent worsening of neuropathy. - Continue current diabetes management and lifestyle modifications - Encourage regular exercise and healthy diet  Chronic kidney disease stage 3 b  Kidney function is stable with creatinine at 1.16, slightly above the target. Blood pressure and blood sugar control are crucial for kidney preservation. - Monitor kidney function regularly - Maintain blood pressure and blood sugar control  Essential hypertension Blood pressure is well-controlled at 128/66. Current antihypertensive regimen is effective. - Continue lisinopril  20 mg daily - Continue amlodipine  10 mg daily  Hyperlipidemia Lipid profile is excellent with total cholesterol at 104, LDL at 42, and triglycerides at 66. HDL has increased to 47, which is positive. Current management is effective. - Continue atorvastatin  40 mg daily - Encourage regular exercise to further increase HDL levels  Iron  deficiency anemia Anemia is present with a hemoglobin level of 10.1, which is below the target of 11.7. Iron  supplementation is ongoing to address this deficiency. - Continue ferrous sulfate  supplementation - Monitor hemoglobin levels regularly  Polyosteoarthritis Arthritis pain managed with Tylenol  and Voltaren gel. She experiences pain in knees and hands, affecting exercise routine. - Continue Tylenol  650 mg as needed, especially before exercise - Apply Voltaren gel as needed for pain relief - Encourage regular exercise to maintain joint function  Vulvar  candidiasis Persistent sensation on the vulvar lip, previously treated with nystatin  cream. She prefers ointment form for better adherence. Ointment mixed with a steroid cream is available. - Prescribe nystatin  ointment mixed with a steroid cream, apply twice daily - Send prescription to [facility] on [address]  Right eye glaucoma with stent placement Recent stent placement in the right eye for high pressure. Vision is good, but follow-up with ophthalmologist is scheduled for further evaluation. - Follow up with ophthalmologist next week for further evaluation  Inguinal hernia, post-repair, with intermittent symptoms Intermittent symptoms of hernia with occasional bulging and discomfort. - Monitor for symptoms of hernia recurrence - Ensure regular bowel movements to prevent exacerbation   Family/ staff Communication: Reviewed plan of care with patient verbalized understanding   Labs/tests ordered:  - CBC with Differential/Platelet - CMP with eGFR([facility]) - TSH - Hgb A1C - Lipid panel   Next Appointment : Return in about 6 months (around 03/19/2024) for medical mangement of chronic issues., Fasting labs in 6 months prior to visit.   Spent 30 minutes of Face to face and non-face to face with patient  >50% time spent counseling; reviewing medical record; tests; labs; documentation and developing future plan of care.   Alison JAYSON Plough, NP

## 2023-09-20 NOTE — Patient Instructions (Signed)
 1.Go to local pharmacy to receive Tdap & covid vaccines.

## 2023-09-21 ENCOUNTER — Telehealth: Payer: Self-pay

## 2023-09-21 DIAGNOSIS — B3731 Acute candidiasis of vulva and vagina: Secondary | ICD-10-CM

## 2023-09-21 DIAGNOSIS — Z23 Encounter for immunization: Secondary | ICD-10-CM | POA: Diagnosis not present

## 2023-09-21 MED ORDER — NYSTATIN 100000 UNIT/GM EX CREA: 1.0000 | TOPICAL_CREAM | Freq: Two times a day (BID) | CUTANEOUS | 3 refills | Status: AC

## 2023-09-21 NOTE — Telephone Encounter (Signed)
 Message routed to PCP Ngetich, Roxan BROCKS, NP to send individual prescription for Nystatin  cream. Pharmacy Rx message also in Rx folder on provider desk as reference.

## 2023-09-21 NOTE — Telephone Encounter (Signed)
 Copied from CRM [id number]. Topic: Clinical - Prescription Issue >> Sep 21, 2023 11:56 AM Marda MATSU wrote: Alison [name]   Reason for CRM: nystatin -triamcinolone  ointment [facility])  Pharmacist is suggesting individual Rx's instead of combo. Due to combo not being a non formulary and it is not covered by patient insurance

## 2023-09-21 NOTE — Telephone Encounter (Addendum)
 Patient called and notified. Also patient states she received Tetanus and Covid vaccine at pharmacy today. I have abstracted to patient chart.

## 2023-09-21 NOTE — Telephone Encounter (Signed)
 Noted

## 2023-09-21 NOTE — Telephone Encounter (Signed)
 Discontinue Nystatin  -triamcinolone  ointment.prescription sent for Nystatin  cream.

## 2023-09-26 NOTE — Progress Notes (Addendum)
 [facility] Return Visit   Referring Provider Ngetich, Roxan BROCKS, NP [address] [place],  [place] [address]  Primary Care Provider Ngetich, Roxan BROCKS, NP  Patient Profile: Toi Stelly is a 78 y.o. female who returns to the [facility] for follow-up of the problem(s) noted below.  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 Slavick was last seen in the GI office 04/30/2022 by Dr. Aneita   Current GI Meds  Pantoprazole  40 mg p.o. daily Imodium as needed  Interval History   Discussed the use of AI scribe software for clinical note transcription with the patient, who gave verbal consent to proceed.  History of Present Illness Taiya Nutting is a 78 year old female with a history HTN, T2DM, HLD, osteopenia, CKD 3, cataracts, IDA who returns to the gastroenterology office for follow-up of multiple gastrointestinal issues  GERD - Reflux symptoms well-controlled with pantoprazole  40 mg daily after breakfast - No current heartburn or reflux symptoms - Denies dysphagia or dyne aphasia  Nausea - Nausea occurs with overeating - No chronic vomiting - Managed with ondansetron  as needed - Prevents nausea by eating small meals  Diarrhea - History of chronic, loose stools  - States she is having 2-3 loose bowel movements a day  - States that this occurred after her subtotal colectomy in 2009 for presumed diverticular bleeding requiring 15 unit PRBC transfusion - Operative report from 2009 documents ileosigmoid anastomosis - Prior GI clinic notes indicate diarrhea may also be due to Gleevac  - Previously  experienced frequent, urgent bowel movements, previously managed with Pepto Bismol as needed - Takes Imodium which controls bowel movements to 2-3 stools a day  GIST s/p resection on Gleevec  - History of gastric GIST status post gastric wedge resection 2023 - Followed by Dr. Onita Mattock, MD and on Gleevec  - CT chest, abdomen pelvis 03/2023 -no recurrence or metastatic disease - EGD 07/2022 -healthy appearing anastomosis and otherwise normal stomach  History of adenomatous colon polyp - Last colonoscopy 2009 - diverticulosis and hemorrhoids - Flexible sigmoidoscopy 2020 -6 mm rectal TA - Patient queries if she is due for surveillance -Dr. Albin notes, and that further surveillance was deferred based upon patient age and medical comorbidities - Advised that I would discuss her overall prognosis with Dr. Mattock to determine if there is a need for repeat flexible sigmoidoscopy   GI Review of Symptoms Significant for diarrhea, nausea. Otherwise negative.  General Review of Systems  Review of systems is significant for the pertinent positives and negatives as listed per the HPI.  Full ROS is otherwise negative.  Past Medical History   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   Goiter, specified as simple    Helicobacter pylori gastritis    Hyperlipidemia LDL goal < 100    Intrahepatic bile duct dilation    Numbness    TOES RT  FOOT and toes left foot   Obesity, unspecified    Osteoarthrosis, unspecified whether generalized or localized, lower leg    Osteopenia    Other specified disease of nail    FUNGUS OF FINGERNAILS   Pernicious anemia    Unspecified glaucoma(365.9)    Unspecified vitamin  D deficiency    Wears dentures    Wears glasses      Past Surgical History   Past Surgical History:  Procedure Laterality Date   BACK SURGERY     CATARACT EXTRACTION     right eye 06/20/15 left eye 09/12/15   COLECTOMY  2009   COLONOSCOPY  09/06/2007   Dr Belvie Just   DILATION AND CURETTAGE OF UTERUS     ENDOSCOPIC RETROGRADE CHOLANGIOPANCREATOGRAPHY (ERCP) WITH PROPOFOL  N/A 03/17/2019   Procedure: ENDOSCOPIC RETROGRADE CHOLANGIOPANCREATOGRAPHY (ERCP) WITH PROPOFOL ;  Surgeon: Aneita Gwendlyn DASEN, MD;  Location: [facility] ENDOSCOPY;  Service: Endoscopy;  Laterality: N/A;   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   JOINT REPLACEMENT     right   LUMBAR LAMINECTOMY/DECOMPRESSION MICRODISCECTOMY N/A 03/30/2013   Procedure: CENTRAL DECOMPRESSION L4 - L5 AND EXCISION 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;  Surgeon: Tanda DELENA Heading, MD;  Location: [facility] ORS;  Service: Orthopedics;  Laterality: N/A;   MULTIPLE TOOTH EXTRACTIONS     REMOVAL OF STONES  03/17/2019   Procedure: REMOVAL OF STONES;  Surgeon: Aneita Gwendlyn DASEN, MD;  Location: [facility] ENDOSCOPY;  Service: Endoscopy;;   SIGMOIDOSCOPY  02/16/2018   Dr Aneita   SPHINCTEROTOMY  03/17/2019   Procedure: SPHINCTEROTOMY;  Surgeon: Aneita Gwendlyn DASEN, MD;  Location: [facility] ENDOSCOPY;  Service: Endoscopy;;   TOTAL KNEE ARTHROPLASTY Right 02/01/2013   Procedure: RIGHT TOTAL KNEE ARTHROPLASTY;  Surgeon: Tanda DELENA Heading, MD;  Location: [facility] ORS;  Service: Orthopedics;  Laterality: Right;   TOTAL KNEE ARTHROPLASTY Left 04/18/2014   Procedure: LEFT TOTAL KNEE ARTHROPLASTY;  Surgeon: Tanda Heading, MD;  Location: [facility] ORS;  Service: Orthopedics;  Laterality: Left;     Allergies and Medications   Allergies  Allergen Reactions   Aspirin  Nausea Only and Nausea And Vomiting    Can take 81 mg but can't take 325mg , hard on stomach. Can take 81 mg but can't take 325mg , hard on stomach.   Current Meds  Medication Sig   acetaminophen  (TYLENOL ) 650 MG CR tablet Take  1,300 mg by mouth as needed.   amLODipine  (NORVASC ) 10 MG tablet Take 1 tablet by mouth once daily   atorvastatin  (LIPITOR) 40 MG tablet TAKE 1 TABLET BY MOUTH ONCE DAILY AT 6 IN THE EVENING   brimonidine (ALPHAGAN) 0.2 % ophthalmic solution Place 1 drop into both eyes 2 (two) times daily.   Calcium  Carb-Cholecalciferol (CALCIUM  600 + D PO) Take 1 tablet by mouth in the morning.   carboxymethylcellulose (REFRESH PLUS) 0.5 % SOLN Place 1 drop into both eyes 3 (three) times daily as needed (dry eyes).    Cholecalciferol (VITAMIN D -3) 1000 UNITS CAPS Take 1,000 Units by mouth in the morning.   COVID-19 mRNA vaccine, Pfizer, (COMIRNATY) syringe Inject 0.3 mLs into the muscle as directed.   diclofenac Sodium (VOLTAREN) 1 % GEL Apply 2 g topically as needed.   dorzolamide -timolol  (COSOPT ) 22.3-6.8 MG/ML ophthalmic solution Place 1 drop into both eyes 2 (two) times daily.   Ferrous Sulfate  (IRON ) 325 (65 Fe) MG TABS Take 1 tablet by mouth once daily   gabapentin  (NEURONTIN ) 300 MG capsule Take 1 capsule by mouth at bedtime   imatinib  (GLEEVEC )  100 MG tablet Take 3 tablets (300 mg total) by mouth daily. Take with meals and large glass of water.Caution:Chemotherapy   lisinopril  (ZESTRIL ) 20 MG tablet Take 1 tablet (20 mg total) by mouth daily.   loperamide (IMODIUM A-D) 2 MG tablet Take 1 mg by mouth daily as needed.   Multiple Vitamin (MULTIVITAMIN WITH MINERALS) TABS tablet Take 1 tablet by mouth daily.   nystatin  cream (MYCOSTATIN ) Apply 1 Application topically 2 (two) times daily.   nystatin -triamcinolone  ointment (MYCOLOG) Apply 1 Application topically 2 (two) times daily.   Omega-3 Fatty Acids (OMEGA 3 PO) Take 1,200 mg by mouth in the morning.   ondansetron  (ZOFRAN ) 8 MG tablet Take 1 tablet (8 mg total) by mouth every 8 (eight) hours as needed for nausea or vomiting.   pantoprazole  (PROTONIX ) 40 MG tablet Take 1 tablet (40 mg total) by mouth daily.   ROCKLATAN 0.02-0.005 % SOLN Apply 1 drop to  eye at bedtime.   shark liver oil-cocoa butter (PREPARATION H) 0.25-88.44 % suppository Place 1 suppository rectally as needed for hemorrhoids.   Zinc Oxide (DESITIN EX) Apply 1 application topically as needed.    Family His   Family History  Problem Relation Age of Onset   Alzheimer's disease Mother    Stroke Mother    Hyperlipidemia Mother    Heart disease Father    Cancer Sister        breast   Diabetes Sister    Cancer Maternal Aunt        breast   Colon polyps Neg Hx    Colon cancer Neg Hx    Esophageal cancer Neg Hx    Rectal cancer Neg Hx    Stomach cancer Neg Hx     Social History   Social History   Tobacco Use   Smoking status: Former    Current packs/day: 0.00    Average packs/day: 0.5 packs/day for 50.0 years (25.0 ttl pk-yrs)    Types: Cigarettes    Start date: 03/1969    Quit date: 03/2019    Years since quitting: 4.5   Smokeless tobacco: Never  Vaping Use   Vaping status: Never Used  Substance Use Topics   Alcohol  use: No   Drug use: No   Jenisa reports that she quit smoking about 4 years ago. Her smoking use included cigarettes. She started smoking about 54 years ago. She has a 25 pack-year smoking history. She has never used smokeless tobacco. She reports that she does not drink alcohol  and does not use drugs.  Vital Signs and Physical Examination   Vitals:   09/27/23 1358  BP: 118/64  Pulse: 80   Body mass index is 30.18 kg/m. Weight: 187 lb (84.8 kg)  General: Generally, resting in chair Head: Normocephalic and atraumatic Eyes: Sclerae anicteric, EOMI Lungs: Clear throughout to auscultation Heart: Regular rate and rhythm; No murmurs, rubs or bruits Abdomen: Soft, non tender and non distended. No masses, hepatosplenomegaly or hernias noted. Normal Bowel sounds Rectal: Deferred Musculoskeletal: Symmetrical with no gross deformities   Review of Data   The following data was reviewed at the time of this encounter:   Laboratory  Studies      Latest Ref Rng & Units 07/28/2023   10:27 AM 04/21/2023   11:37 AM 03/15/2023    8:33 AM  CBC  WBC 4.0 - 10.5 K/uL 7.8  8.0  6.8   Hemoglobin 12.0 - 15.0 g/dL 89.8  89.2  89.1   Hematocrit 36.0 -  46.0 % 31.2  32.8  34.5   Platelets 150 - 400 K/uL 271  275  302     Lab Results  Component Value Date   LIPASE 19 11/14/2020      Latest Ref Rng & Units 07/28/2023   10:27 AM 04/21/2023   11:37 AM 03/15/2023    8:33 AM  CMP  Glucose 70 - 99 mg/dL 890  895  99   BUN 8 - 23 mg/dL 13  16  16    Creatinine 0.44 - 1.00 mg/dL 8.83  8.77  8.89   Sodium 135 - 145 mmol/L 137  136  136   Potassium 3.5 - 5.1 mmol/L 4.5  4.3  4.5   Chloride 98 - 111 mmol/L 106  104  104   CO2 22 - 32 mmol/L 28  27  26    Calcium  8.9 - 10.3 mg/dL 9.0  9.3  9.4   Total Protein 6.5 - 8.1 g/dL 6.9  7.2  7.0   Total Bilirubin 0.0 - 1.2 mg/dL 0.5  0.6  0.5   Alkaline Phos 38 - 126 U/L 59  67    AST 15 - 41 U/L 21  22  22    ALT 0 - 44 U/L 13  13  14      Imaging Studies  CT chest abdomen pelvis May 09, 2023 1. Unchanged appearance of the stomach status post partial gastrectomy of the greater curvature. Unchanged hyperdense nodule adjacent to the greater curvature, possibly calcified and measuring no greater than 0.6 cm. Attention on follow-up. 2. Multiple areas of clustered centrilobular and tree-in-bud pulmonary nodularity, some of which is fluctuant compared to prior examination, consistent with ongoing atypical mycobacterial infection. 3. Cholelithiasis. 4. Uterine fibroids.  CT chest abdomen pelvis 10/14/2022 1. Prior partial gastrectomy with unchanged size of the 6 mm nodular focus adjacent to the suture line. 2. Previously seen pulmonary nodules have resolved or are too small to accurately characterize. However there are few new scattered bilateral pulmonary nodules which are nonspecific and warrant attention on short-term interval follow-up chest CT. 3. Cholelithiasis without findings of acute  cholecystitis. 4. Leiomyomatous uterus. 5.  Aortic Atherosclerosis (ICD10-I70.0).  CT chest abdomen pelvis 07/12/2021 1. Prior partial gastrectomy with a new 6 mm nodular focus of enhancement near the suture line, nonspecific possibly reflecting local recurrence. Consider further evaluation with endoscopy or short-term interval follow-up CT. 2. A few new scattered left-sided pulmonary nodules including clustered nodularity in the left upper lobe measure up to 3 mm, nonspecific and may be infectious/inflammatory but warranting attention on short-term interval follow-up chest CT to ensure resolution. 3. No evidence of metastatic disease in the abdomen or pelvis. 4.  Aortic Atherosclerosis (ICD10-I70.0).  CTAP 01/01/2022 1. Post partial gastric resection. Nodular area adjacent to the suture margin along the greater curvature of the stomach is no longer visible. No new or progressive findings. 2. Stable appearance of pneumobilia since previous imaging. Correlate with any history of prior sphincterotomy or liver function abnormalities. 3. Cholelithiasis without pericholecystic stranding. 4. 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 Attention on follow-up or dedicated follow-up in 1 year may be helpful. 5. Post near-total colectomy. 6. Ventral abdominal wall laxity with rectus diastasis is unchanged. 7. Fibroid uterus. 8. Aortic atherosclerosis.  GI Procedures and Studies  EGD 07/29/2022 - Normal esophagus.  - A previous surgical anastomosis was found, characterized by healthy appearing mucosa. Biopsied.  - Normal duodenal bulb and second portion of the duodenum.  EGD 12/03/2020 - Normal  esophagus.  - Normal stomach.  - Normal examined duodenum  EGD 09/20/2019 - Normal esophagus.  - Erythematous mucosa in the antrum. Biopsied.  - Normal duodenal bulb and second portion of the duodenum  ERCP 03/17/2019 - The common bile duct was dilated, with a stone causing an  obstruction.  - Choledocholithiasis was found. Complete removal was accomplished by biliary sphincterotomy and balloon extraction.  - A biliary sphincterotomy was performed.  - The biliary tree was swept.  Flexible sigmoidoscopy 02/16/2018 - One 6 mm polyp in the rectum, removed with a cold snare. Resected and retrieved.  - Patent end-to-side ileo-colonic anastomosis, with one diverticulum, characterized by healthy appearing mucosa.  - Internal hemorrhoids.  - The examination was otherwise normal.  Colonoscopy 09/09/2007 - Diverticulosis throughout the entire colon - Internal hemorrhoids  Clinical Impression  It is my clinical impression that Ms. Syracuse is a 78 y.o. 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 Taplin returns to the gastroenterology office for follow-up of multiple gastrointestinal issues.  Her primary concerns today centered around her history of GERD, nausea, diarrhea and history of colon polyps.  GERD is currently well-controlled on pantoprazole .  She notes symptoms of nausea if she overeats which is likely related to her altered gastric anatomy status post her wedge resection for GIST.  She has learned to modify her diet by eating small meals and uses Zofran  as needed.  She has had chronic diarrhea which she relates began after subtotal colectomy with ileosigmoid anastomosis in 2009 for diverticular bleeding.  Her notes from Dr. Aneita also commented that Gleevec  could be a contributing factor.  She has found the use of Imodium beneficial.  Has not needed to institute any specific dietary changes.  She inquires today about the need for a follow-up  colonoscopy/flexible sigmoidoscopy.  Her last full colonoscopy was performed in 2009.  She subsequently underwent subtotal colectomy with ileosigmoid anastomosis for diverticular bleeding.  Flexible sigmoidoscopy in 2020 disclosed a 1 rectal tubular adenoma.  Reviewed that whether or not to perform surveillance depends upon life expectancy.  Will discuss prognosis with Dr. Lanny for further decision making.  Plan  Continue pantoprazole  40 mg orally daily GERD diet lifestyle modification Zofran  8 mg p.o. every 8 hours as needed for nausea and vomiting Imodium 1 to 2 tablets a day as needed for diarrhea Review overall GIST prognosis with Dr. Lanny to determine if there is any need for surveillance flexible sigmoidoscopy Continue GIST surveillance/imaging with Dr. Lanny  Planned Follow Up 12 months  The patient or caregiver verbalized understanding of the material covered, with no barriers to understanding. All questions were answered. Patient or caregiver is agreeable with the plan outlined above.    It was a pleasure to see Jimi.  If you have any questions or concerns regarding this evaluation, do not hesitate to contact me.  Inocente Hausen, MD [facility]   I spent total of 30 minutes in both face-to-face (20 minutes interview) and non-face-to-face (10 minutes chart review, care coordination, documentation)  activities, excluding procedures performed, for the visit on the date of this encounter.  Addendum 10/11/2023: Ms. Lamia' case was reviewed with Dr. Lanny -responding well to treatment for GIST and has reasonable life expectancy of  5 years or more at this time.  I spoke with Ms. Joaquin on the telephone today regarding her query as to whether or not she wanted to proceed with a follow-up flexible sigmoidoscopy for colon polyp surveillance.  Discussed that her risk of colorectal cancer is relatively low given history of subtotal colectomy.  After risk/benefit discussion, she  prefers to defer on further endoscopic procedures which I think is reasonable.  She will notify our office if she develops symptoms that warrant diagnostic evaluation.  See telephone note documented on same date.

## 2023-09-27 ENCOUNTER — Ambulatory Visit: Admitting: Pediatrics

## 2023-09-27 ENCOUNTER — Encounter: Payer: Self-pay | Admitting: Pediatrics

## 2023-09-27 VITALS — BP 118/64 | HR 80 | Ht 66.0 in | Wt 187.0 lb

## 2023-09-27 DIAGNOSIS — R110 Nausea: Secondary | ICD-10-CM

## 2023-09-27 DIAGNOSIS — K529 Noninfective gastroenteritis and colitis, unspecified: Secondary | ICD-10-CM

## 2023-09-27 DIAGNOSIS — R195 Other fecal abnormalities: Secondary | ICD-10-CM

## 2023-09-27 DIAGNOSIS — Z860100 Personal history of colon polyps, unspecified: Secondary | ICD-10-CM

## 2023-09-27 DIAGNOSIS — C49A0 Gastrointestinal stromal tumor, unspecified site: Secondary | ICD-10-CM

## 2023-09-27 DIAGNOSIS — K219 Gastro-esophageal reflux disease without esophagitis: Secondary | ICD-10-CM

## 2023-09-27 DIAGNOSIS — Z860101 Personal history of adenomatous and serrated colon polyps: Secondary | ICD-10-CM

## 2023-09-27 DIAGNOSIS — R112 Nausea with vomiting, unspecified: Secondary | ICD-10-CM

## 2023-09-27 NOTE — Patient Instructions (Signed)
 Follow up in 1 year or sooner if needed.  Thank you for entrusting me with your care and for choosing [facility], Dr. Inocente Hausen   _______________________________________________________  If your blood pressure at your visit was 140/90 or greater, please contact your primary care physician to follow up on this.  _______________________________________________________  If you are age 79 or older, your body mass index should be between 23-30. Your Body mass index is 30.18 kg/m. If this is out of the aforementioned range listed, please consider follow up with your Primary Care Provider.  If you are age 51 or younger, your body mass index should be between 19-25. Your Body mass index is 30.18 kg/m. If this is out of the aformentioned range listed, please consider follow up with your Primary Care Provider.   ________________________________________________________  The [facility] providers would like to encourage you to use MYCHART to communicate with providers for non-urgent requests or questions.  Due to long hold times on the telephone, sending your provider a message by [facility] may be a faster and more efficient way to get a response.  Please allow 48 business hours for a response.  Please remember that this is for non-urgent requests.  _______________________________________________________  [facility] is using a team-based approach to care.  Your team is made up of your doctor and two to three APPS. Our APPS (Nurse Practitioners and Physician Assistants) work with your physician to ensure care continuity for you. They are fully qualified to address your health concerns and develop a treatment plan. They communicate directly with your gastroenterologist to care for you. Seeing the Advanced Practice Practitioners on your physician's team can help you by facilitating care more promptly, often allowing for earlier appointments, access to diagnostic testing, procedures,  and other specialty referrals.

## 2023-10-11 ENCOUNTER — Telehealth: Payer: Self-pay | Admitting: Pediatrics

## 2023-10-11 NOTE — Telephone Encounter (Signed)
 I spoke on the telephone with Alison [name] to follow-up her query regarding possible repeat flexible sigmoidoscopy/colonoscopy discussed at her last clinic visit.  I communicated with her oncologist, Dr. Lanny, who reported that [name] has been faring well with treatment for GIST and likelihood of 5-year survival or more is good.  As such, I revisited with [name] whether or not she wanted to proceed with a flexible sigmoidoscopy for colon polyp surveillance.  We did discuss that her risk of colon cancer is relatively low given that she has had a subtotal colectomy.  After risk/benefit discussion she has decided to defer further endoscopic procedures for colon polyp surveillance which I think is certainly reasonable.  Should she develop symptoms that warrant further investigation she will notify our office.

## 2023-10-20 ENCOUNTER — Other Ambulatory Visit: Payer: Self-pay | Admitting: Family

## 2023-10-21 ENCOUNTER — Ambulatory Visit (HOSPITAL_COMMUNITY)
Admission: RE | Admit: 2023-10-21 | Discharge: 2023-10-21 | Disposition: A | Discharge status: Home / Self Care | Source: Ambulatory Visit | Attending: Hematology | Admitting: Hematology

## 2023-10-21 ENCOUNTER — Inpatient Hospital Stay: Attending: Hematology

## 2023-10-21 DIAGNOSIS — N189 Chronic kidney disease, unspecified: Secondary | ICD-10-CM | POA: Diagnosis not present

## 2023-10-21 DIAGNOSIS — Z79899 Other long term (current) drug therapy: Secondary | ICD-10-CM | POA: Insufficient documentation

## 2023-10-21 DIAGNOSIS — C49A0 Gastrointestinal stromal tumor, unspecified site: Secondary | ICD-10-CM

## 2023-10-21 DIAGNOSIS — M25569 Pain in unspecified knee: Secondary | ICD-10-CM | POA: Diagnosis not present

## 2023-10-21 DIAGNOSIS — D649 Anemia, unspecified: Secondary | ICD-10-CM | POA: Diagnosis not present

## 2023-10-21 DIAGNOSIS — K8020 Calculus of gallbladder without cholecystitis without obstruction: Secondary | ICD-10-CM | POA: Diagnosis not present

## 2023-10-21 DIAGNOSIS — C49A2 Gastrointestinal stromal tumor of stomach: Secondary | ICD-10-CM | POA: Diagnosis not present

## 2023-10-21 DIAGNOSIS — N289 Disorder of kidney and ureter, unspecified: Secondary | ICD-10-CM | POA: Diagnosis not present

## 2023-10-21 DIAGNOSIS — I3139 Other pericardial effusion (noninflammatory): Secondary | ICD-10-CM | POA: Insufficient documentation

## 2023-10-21 DIAGNOSIS — I10 Essential (primary) hypertension: Secondary | ICD-10-CM | POA: Diagnosis not present

## 2023-10-21 DIAGNOSIS — E1122 Type 2 diabetes mellitus with diabetic chronic kidney disease: Secondary | ICD-10-CM | POA: Insufficient documentation

## 2023-10-21 LAB — CMP (CANCER CENTER ONLY)
ALT: 13 U/L (ref 0–44)
AST: 21 U/L (ref 15–41)
Albumin: 4.3 g/dL (ref 3.5–5.0)
Alkaline Phosphatase: 67 U/L (ref 38–126)
Anion gap: 4 — ABNORMAL LOW (ref 5–15)
BUN: 14 mg/dL (ref 8–23)
CO2: 29 mmol/L (ref 22–32)
Calcium: 9.5 mg/dL (ref 8.9–10.3)
Chloride: 103 mmol/L (ref 98–111)
Creatinine: 1.02 mg/dL — ABNORMAL HIGH (ref 0.44–1.00)
GFR, Estimated: 56 mL/min — ABNORMAL LOW (ref >60)
Glucose, Bld: 97 mg/dL (ref 70–99)
Potassium: 3.8 mmol/L (ref 3.5–5.1)
Sodium: 136 mmol/L (ref 135–145)
Total Bilirubin: 0.6 mg/dL (ref 0.0–1.2)
Total Protein: 7.4 g/dL (ref 6.5–8.1)

## 2023-10-21 LAB — CBC WITH DIFFERENTIAL (CANCER CENTER ONLY)
Abs Immature Granulocytes: 0.03 K/uL (ref 0.00–0.07)
Basophils Absolute: 0 K/uL (ref 0.0–0.1)
Basophils Relative: 0 %
Eosinophils Absolute: 0.1 K/uL (ref 0.0–0.5)
Eosinophils Relative: 2 %
HCT: 31.8 % — ABNORMAL LOW (ref 36.0–46.0)
Hemoglobin: 10.4 g/dL — ABNORMAL LOW (ref 12.0–15.0)
Immature Granulocytes: 0 %
Lymphocytes Relative: 23 %
Lymphs Abs: 2 K/uL (ref 0.7–4.0)
MCH: 30.1 pg (ref 26.0–34.0)
MCHC: 32.7 g/dL (ref 30.0–36.0)
MCV: 92.2 fL (ref 80.0–100.0)
Monocytes Absolute: 0.8 K/uL (ref 0.1–1.0)
Monocytes Relative: 9 %
Neutro Abs: 5.5 K/uL (ref 1.7–7.7)
Neutrophils Relative %: 66 %
Platelet Count: 279 K/uL (ref 150–400)
RBC: 3.45 MIL/uL — ABNORMAL LOW (ref 3.87–5.11)
RDW: 13.7 % (ref 11.5–15.5)
WBC Count: 8.4 K/uL (ref 4.0–10.5)
nRBC: 0 % (ref 0.0–0.2)

## 2023-10-21 MED ORDER — SODIUM CHLORIDE (PF) 0.9 % IJ SOLN
INTRAMUSCULAR | Status: AC
  Filled 2023-10-21: qty 50

## 2023-10-21 MED ORDER — IOHEXOL 300 MG/ML  SOLN
100.0000 mL | Freq: Once | INTRAMUSCULAR | Status: AC | PRN
  Administered 2023-10-21: 100 mL via INTRAVENOUS

## 2023-10-24 ENCOUNTER — Other Ambulatory Visit: Payer: Self-pay | Admitting: Family

## 2023-10-28 ENCOUNTER — Inpatient Hospital Stay: Admitting: Hematology

## 2023-10-28 VITALS — BP 138/66 | HR 84 | Temp 98.3°F | Resp 15 | Ht 66.0 in | Wt 193.2 lb

## 2023-10-28 DIAGNOSIS — I10 Essential (primary) hypertension: Secondary | ICD-10-CM | POA: Diagnosis not present

## 2023-10-28 DIAGNOSIS — D649 Anemia, unspecified: Secondary | ICD-10-CM | POA: Diagnosis not present

## 2023-10-28 DIAGNOSIS — N189 Chronic kidney disease, unspecified: Secondary | ICD-10-CM | POA: Diagnosis not present

## 2023-10-28 DIAGNOSIS — C49A0 Gastrointestinal stromal tumor, unspecified site: Secondary | ICD-10-CM

## 2023-10-28 DIAGNOSIS — I3139 Other pericardial effusion (noninflammatory): Secondary | ICD-10-CM

## 2023-10-28 DIAGNOSIS — C49A2 Gastrointestinal stromal tumor of stomach: Secondary | ICD-10-CM | POA: Diagnosis not present

## 2023-10-28 DIAGNOSIS — E1122 Type 2 diabetes mellitus with diabetic chronic kidney disease: Secondary | ICD-10-CM | POA: Diagnosis not present

## 2023-10-28 MED ORDER — IMATINIB MESYLATE 100 MG PO TABS: 300.0000 mg | ORAL_TABLET | Freq: Every day | ORAL | 1 refills | Status: DC

## 2023-10-28 NOTE — Progress Notes (Signed)
 [facility]   [phone] [phone]   Clinic Follow up Note   Patient Care Team: Ngetich, Roxan BROCKS, NP as PCP - General (Family Medicine) Manford, Elspeth ORN, MD as Referring Physician (Optometry) Heide Ingle, MD as Consulting Physician (Orthopedic Surgery) Octavia Bruckner, MD as Consulting Physician (Ophthalmology) Aneita Gwendlyn DASEN, MD (Inactive) as Consulting Physician (Gastroenterology) Twylla Glendia BROCKS, MD (Urology) Stechschulte, Deward PARAS, MD as Consulting Physician (Surgery) Lanny Callander, MD as Consulting Physician (Hematology)  Date of Service:  10/28/2023  CHIEF COMPLAINT: f/u of gastric GIST   CURRENT THERAPY:  Adjuvant imatinib  300 mg daily  Oncology History   Gastrointestinal stromal tumor (GIST) (HCC) pT4NxM0, G2, 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 S/p partial gastrectomy 01/14/21 by Dr. Lyndel; path showed 12.8 cm unifocal GIST, margins negative. No lymph nodes were evaluated.  -she has high risk of recurrence (86%) due to the large size and high grade.  -staging chest CT 02/17/21 negative for metastatic disease. -KIT mutation was detected on surgical sample, which indicates she should have a good response to Imatinib   -she began Gleevec  400 mg on 03/09/21. She is tolerating well so far with mild to moderate diarrhea, improved with Imodium. She developed worsening epigastric pain, and Gleevec  was held for a month. -she restarted Gleevec  at 100 mg on 05/22/21 and gradually increased to 300 mg daily in June, she is tolerating well so far  -restaging CT CAP from 12/31/2021 showed no evidence of recurrence.  Previous nodular area adjacent to the suture margin has resolved. 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 -She is clinically doing well, tolerating imatinib  well, no clinical concern for recurrence. -Surveillance  CT scan on October 14 2022 was negative for disease recurrence, except a 6 mm nodule at the suture line of her stomach.  She had EGD in July 2024 which was negative -Surveillance CT scan from April 02, 2023 showed stable hyperdense nodule adjacent to the greater curvature and small lung nodules, no definitive evidence of recurrence.  Will continue monitoring.    Assessment & Plan Gastrointestinal stromal tumor Currently on Gleevec  (imatinib ) therapy since February 2023 after diagnosis in November 2022. On a low dose due to previous intolerance. Recent imaging from October 20, 2021, shows no signs of recurrence. High risk of recurrence estimated at 80%. - Refill Gleevec  prescription for three months with one additional refill - Discuss potential extension of Gleevec  treatment to five years at next visit if tolerated  Pericardial effusion Recent imaging revealed a small pericardial effusion. No symptoms of acute infection or pneumonia. Effusion not related to the tumor. Potentially related to Gleevec , which can cause fluid accumulation. - Order echocardiogram to assess pericardial effusion - Monitor for symptoms such as shortness of breath - Consider referral to cardiologist if significant effusion is found  Chronic kidney disease Chronic kidney disease is present with improved kidney function tests. No significant concerns at this time.  Anemia Mild anemia is present. No acute management is required at this time.  Knee pain Knee pain likely due to inflammation and possibly related to arthritis. She has implants and has been using Celebrex  for management. Unable to take injections due to implants. - Consider contacting primary care physician or orthopedics for further management - Discuss potential for Celebrex  prescription with primary care physician  Plan - I personally reviewed her restaging CT scan from October 21, 2023, which showed no  evidence of recurrence.  CT showed a small  pericardial effusion, I will order echocardiogram for further evaluation.  - She is tolerating imatinib  well, will continue current dose.  We discussed extend her adjuvant therapy to 5 years due to extremely high risk of recurrence. - Lab and follow-up in 3 months.  Discussed the use of AI scribe software for clinical note transcription with the patient, who gave verbal consent to proceed.  History of Present Illness Alison [name] is a 78 year old female with a history of a chest tumor who presents for follow-up.  Her last scan was on October 21, 2023. She has been on Gleevec  since February 2023, currently taking 100 mg, three tablets daily. She sources her medication from [employer] [employer], [facility], and is due for a refill. She was diagnosed in November 2022 and began treatment in early 2023.  She experiences knee pain, likely due to inflammation and possibly arthritis, worsened since knee implants. Celebrex  is prescribed but less effective as symptoms have worsened.  She has a history of smoking but has been smoke-free for three years. She experiences mucus production without cough or acute infection. She has mild anemia and chronic kidney disease. No breathing issues or shortness of breath.  She has a history of stomach surgery and has been informed of a small amount of fluid around her heart, which is new for her. No previous heart issues.     All other systems were reviewed with the patient and are negative.  MEDICAL HISTORY:  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   Goiter, specified as simple    Helicobacter pylori gastritis    Hyperlipidemia LDL goal < 100    Intrahepatic bile duct  dilation    Numbness    TOES RT FOOT and toes left foot   Obesity, unspecified    Osteoarthrosis, unspecified whether generalized or localized, lower leg    Osteopenia    Other specified disease of nail    FUNGUS OF FINGERNAILS   Pernicious anemia    Unspecified glaucoma(365.9)    Unspecified vitamin D  deficiency    Wears dentures    Wears glasses     SURGICAL HISTORY: Past Surgical History:  Procedure Laterality Date   BACK SURGERY     CATARACT EXTRACTION     right eye 06/20/15 left eye 09/12/15   COLECTOMY  2009   COLONOSCOPY  09/06/2007   Dr Belvie Just   DILATION AND CURETTAGE OF UTERUS     ENDOSCOPIC RETROGRADE CHOLANGIOPANCREATOGRAPHY (ERCP) WITH PROPOFOL  N/A 03/17/2019   Procedure: ENDOSCOPIC RETROGRADE CHOLANGIOPANCREATOGRAPHY (ERCP) WITH PROPOFOL ;  Surgeon: Aneita Gwendlyn DASEN, MD;  Location: [facility] ENDOSCOPY;  Service: Endoscopy;  Laterality: N/A;   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   JOINT REPLACEMENT     right   LUMBAR LAMINECTOMY/DECOMPRESSION MICRODISCECTOMY N/A 03/30/2013   Procedure: CENTRAL DECOMPRESSION L4 - L5 AND EXCISION 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;  Surgeon: Tanda DELENA Heading, MD;  Location: [facility] ORS;  Service: Orthopedics;  Laterality: N/A;   MULTIPLE TOOTH EXTRACTIONS     REMOVAL OF STONES  03/17/2019   Procedure: REMOVAL OF STONES;  Surgeon: Aneita Gwendlyn DASEN, MD;  Location:  [facility] ENDOSCOPY;  Service: Endoscopy;;   SIGMOIDOSCOPY  02/16/2018   Dr Aneita   SPHINCTEROTOMY  03/17/2019   Procedure: SPHINCTEROTOMY;  Surgeon: Aneita Gwendlyn DASEN, MD;  Location: [facility] ENDOSCOPY;  Service: Endoscopy;;   TOTAL KNEE ARTHROPLASTY Right 02/01/2013   Procedure: RIGHT TOTAL KNEE ARTHROPLASTY;  Surgeon: Tanda DELENA Heading, MD;  Location: [facility];  Service: Orthopedics;  Laterality: Right;   TOTAL KNEE ARTHROPLASTY Left 04/18/2014   Procedure: LEFT TOTAL KNEE ARTHROPLASTY;  Surgeon: Tanda Heading, MD;  Location: [facility] OR[facility];  Service: Orthopedics;  Laterality: Left;    I have  reviewed the social history and family history with the patient and they are unchanged from previous note.  ALLERGIES:  is allergic to aspirin .  MEDICATIONS:  Current Outpatient Medications  Medication Sig Dispense Refill   acetaminophen  (TYLENOL ) 650 MG CR tablet Take 1,300 mg by mouth as needed.     amLODipine  (NORVASC ) 10 MG tablet Take 1 tablet by mouth once daily 90 tablet 1   atorvastatin  (LIPITOR) 40 MG tablet TAKE 1 TABLET BY MOUTH ONCE DAILY AT 6 IN THE EVENING 90 tablet 1   brimonidine (ALPHAGAN) 0.2 % ophthalmic solution Place 1 drop into both eyes 2 (two) times daily.     Calcium  Carb-Cholecalciferol (CALCIUM  600 + D PO) Take 1 tablet by mouth in the morning.     carboxymethylcellulose (REFRESH PLUS) 0.5 % SOLN Place 1 drop into both eyes 3 (three) times daily as needed (dry eyes).      Cholecalciferol (VITAMIN D -3) 1000 UNITS CAPS Take 1,000 Units by mouth in the morning.     COVID-19 mRNA vaccine, Pfizer, (COMIRNATY) syringe Inject 0.3 mLs into the muscle as directed. 0.3 mL 1   diclofenac Sodium (VOLTAREN) 1 % GEL Apply 2 g topically as needed.     dorzolamide -timolol  (COSOPT ) 22.3-6.8 MG/ML ophthalmic solution Place 1 drop into both eyes 2 (two) times daily.     Ferrous Sulfate  (IRON ) 325 (65 Fe) MG TABS Take 1 tablet by mouth once daily 90 tablet 0   gabapentin  (NEURONTIN ) 300 MG capsule Take 1 capsule by mouth at bedtime 90 capsule 1   lisinopril  (ZESTRIL ) 20 MG tablet Take 1 tablet by mouth once daily 90 tablet 0   loperamide (IMODIUM A-D) 2 MG tablet Take 1 mg by mouth daily as needed.     Multiple Vitamin (MULTIVITAMIN WITH MINERALS) TABS tablet Take 1 tablet by mouth daily.     nystatin  cream (MYCOSTATIN ) Apply 1 Application topically 2 (two) times daily. 30 g 3   nystatin -triamcinolone  ointment (MYCOLOG) Apply 1 Application topically 2 (two) times daily. 30 g 0   Omega-3 Fatty Acids (OMEGA 3 PO) Take 1,200 mg by mouth in the morning.     ondansetron  (ZOFRAN ) 8 MG  tablet Take 1 tablet (8 mg total) by mouth every 8 (eight) hours as needed for nausea or vomiting. 20 tablet 1   pantoprazole  (PROTONIX ) 40 MG tablet Take 1 tablet (40 mg total) by mouth daily. 90 tablet 5   ROCKLATAN 0.02-0.005 % SOLN Apply 1 drop to eye at bedtime.     shark liver oil-cocoa butter (PREPARATION H) 0.25-88.44 % suppository Place 1 suppository rectally as needed for hemorrhoids.     Zinc Oxide (DESITIN EX) Apply 1 application topically as needed.     imatinib  (GLEEVEC ) 100 MG tablet Take 3 tablets (300 mg total) by mouth daily. Take with meals and large glass of water.Caution:Chemotherapy 270 tablet 1   No current facility-administered medications for  this visit.    PHYSICAL EXAMINATION: ECOG PERFORMANCE STATUS: 1 - Symptomatic but completely ambulatory  Vitals:   10/28/23 1118  BP: 138/66  Pulse: 84  Resp: 15  Temp: 98.3 F (36.8 C)  SpO2: 99%   Wt Readings from Last 3 Encounters:  10/28/23 193 lb 3.2 oz (87.6 kg)  09/27/23 187 lb (84.8 kg)  09/20/23 187 lb 3.2 oz (84.9 kg)     GENERAL:alert, no distress and comfortable SKIN: skin color, texture, turgor are normal, no rashes or significant lesions EYES: normal, Conjunctiva are pink and non-injected, sclera clear NECK: supple, thyroid  normal size, non-tender, without nodularity LYMPH:  no palpable lymphadenopathy in the cervical, axillary  LUNGS: clear to auscultation and percussion with normal breathing effort HEART: regular rate & rhythm and no murmurs and no lower extremity edema ABDOMEN:abdomen soft, non-tender and normal bowel sounds Musculoskeletal:no cyanosis of digits and no clubbing  NEURO: alert & oriented x 3 with fluent speech, no focal motor/sensory deficits  Physical Exam    LABORATORY DATA:  I have reviewed the data as listed    Latest Ref Rng & Units 10/21/2023   10:29 AM 07/28/2023   10:27 AM 04/21/2023   11:37 AM  CBC  WBC 4.0 - 10.5 K/uL 8.4  7.8  8.0   Hemoglobin 12.0 - 15.0 g/dL  89.5  89.8  89.2   Hematocrit 36.0 - 46.0 % 31.8  31.2  32.8   Platelets 150 - 400 K/uL 279  271  275         Latest Ref Rng & Units 10/21/2023   10:29 AM 07/28/2023   10:27 AM 04/21/2023   11:37 AM  CMP  Glucose 70 - 99 mg/dL 97  890  895   BUN 8 - 23 mg/dL 14  13  16    Creatinine 0.44 - 1.00 mg/dL 8.97  8.83  8.77   Sodium 135 - 145 mmol/L 136  137  136   Potassium 3.5 - 5.1 mmol/L 3.8  4.5  4.3   Chloride 98 - 111 mmol/L 103  106  104   CO2 22 - 32 mmol/L 29  28  27    Calcium  8.9 - 10.3 mg/dL 9.5  9.0  9.3   Total Protein 6.5 - 8.1 g/dL 7.4  6.9  7.2   Total Bilirubin 0.0 - 1.2 mg/dL 0.6  0.5  0.6   Alkaline Phos 38 - 126 U/L 67  59  67   AST 15 - 41 U/L 21  21  22    ALT 0 - 44 U/L 13  13  13        RADIOGRAPHIC STUDIES: I have personally reviewed the radiological images as listed and agreed with the findings in the report. No results found.    Orders Placed This Encounter  Procedures   ECHOCARDIOGRAM COMPLETE    Standing Status:   Future    Expected Date:   11/11/2023    Expiration Date:   10/27/2024    Where should this test be performed:   [facility]    Perflutren DEFINITY (image enhancing agent) should be administered unless hypersensitivity or allergy exist:   Administer Perflutren    Reason for exam-Echo:   Pericardial effusion I31.3   All questions were answered. The patient knows to call the clinic with any problems, questions or concerns. No barriers to learning was detected. The total time spent in the appointment was 30 minutes, including review of chart and various tests results, discussions about  plan of care and coordination of care plan     Onita Mattock, MD 10/28/2023

## 2023-10-28 NOTE — Assessment & Plan Note (Signed)
 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 S/p partial gastrectomy 01/14/21 by Dr. 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 No lymph nodes were evaluated.  -she has high risk of recurrence (86%) due to the large size and high grade.  -staging chest CT 02/17/21 negative for metastatic disease. 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 She is tolerating well so far with mild to moderate diarrhea, improved with Imodium. She developed worsening epigastric pain, and Gleevec was held for a month. -she restarted Gleevec at 100 mg on 05/22/21 and gradually increased to 300 mg daily in June, she is tolerating well so far  -restaging CT CAP from 12/31/2021 showed no evidence of recurrence.  Previous nodular area adjacent to the suture margin has resolved. 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 -She is clinically doing well, tolerating imatinib well, no clinical concern for recurrence. -Surveillance CT scan on October 14 2022 was negative for disease recurrence, except a 6 mm nodule at the suture line of her stomach.  She had EGD in July 2024 which was negative -Surveillance CT scan from April 02, 2023 showed stable hyperdense nodule adjacent to the greater curvature and small lung nodules, no definitive evidence of recurrence.  Will continue monitoring.

## 2023-11-01 ENCOUNTER — Ambulatory Visit (HOSPITAL_COMMUNITY)

## 2023-11-01 ENCOUNTER — Ambulatory Visit (HOSPITAL_COMMUNITY)
Admission: RE | Admit: 2023-11-01 | Discharge: 2023-11-01 | Disposition: A | Discharge status: Home / Self Care | Source: Ambulatory Visit | Attending: Cardiology | Admitting: Cardiology

## 2023-11-01 DIAGNOSIS — I3139 Other pericardial effusion (noninflammatory): Secondary | ICD-10-CM | POA: Insufficient documentation

## 2023-11-01 LAB — ECHOCARDIOGRAM COMPLETE
AR max vel: 2.65 cm2
AV Area VTI: 2.5 cm2
AV Area mean vel: 2.66 cm2
AV Mean grad: 7.5 mmHg
AV Peak grad: 13.9 mmHg
Ao pk vel: 1.87 m/s
Area-P 1/2: 3.96 cm2
S' Lateral: 2.5 cm

## 2023-11-06 DIAGNOSIS — M25561 Pain in right knee: Secondary | ICD-10-CM | POA: Diagnosis not present

## 2023-11-09 DIAGNOSIS — M25561 Pain in right knee: Secondary | ICD-10-CM | POA: Diagnosis not present

## 2023-11-09 DIAGNOSIS — Z96651 Presence of right artificial knee joint: Secondary | ICD-10-CM | POA: Diagnosis not present

## 2023-11-16 ENCOUNTER — Other Ambulatory Visit (HOSPITAL_COMMUNITY)

## 2023-11-17 ENCOUNTER — Other Ambulatory Visit: Payer: Self-pay | Admitting: Family

## 2023-11-17 DIAGNOSIS — D509 Iron deficiency anemia, unspecified: Secondary | ICD-10-CM

## 2023-11-24 DIAGNOSIS — Z7689 Persons encountering health services in other specified circumstances: Secondary | ICD-10-CM | POA: Diagnosis not present

## 2023-11-24 DIAGNOSIS — Z96652 Presence of left artificial knee joint: Secondary | ICD-10-CM | POA: Diagnosis not present

## 2023-11-24 DIAGNOSIS — Z96651 Presence of right artificial knee joint: Secondary | ICD-10-CM | POA: Diagnosis not present

## 2023-11-24 DIAGNOSIS — M25561 Pain in right knee: Secondary | ICD-10-CM | POA: Diagnosis not present

## 2023-11-25 ENCOUNTER — Other Ambulatory Visit (HOSPITAL_COMMUNITY): Payer: Self-pay | Admitting: Orthopedic Surgery

## 2023-11-25 DIAGNOSIS — Z96651 Presence of right artificial knee joint: Secondary | ICD-10-CM

## 2023-11-30 ENCOUNTER — Ambulatory Visit (HOSPITAL_COMMUNITY)
Admission: RE | Admit: 2023-11-30 | Discharge: 2023-11-30 | Disposition: A | Discharge status: Home / Self Care | Source: Ambulatory Visit | Attending: Orthopedic Surgery | Admitting: Orthopedic Surgery

## 2023-11-30 ENCOUNTER — Encounter (HOSPITAL_COMMUNITY): Payer: Self-pay

## 2023-11-30 DIAGNOSIS — Z96651 Presence of right artificial knee joint: Secondary | ICD-10-CM | POA: Diagnosis not present

## 2023-11-30 MED ORDER — TECHNETIUM TC 99M MEDRONATE IV KIT
20.0000 | PACK | Freq: Once | INTRAVENOUS | Status: AC
  Administered 2023-11-30: 21.5 via INTRAVENOUS

## 2023-12-06 ENCOUNTER — Other Ambulatory Visit: Payer: Self-pay | Admitting: Family

## 2023-12-06 DIAGNOSIS — E1149 Type 2 diabetes mellitus with other diabetic neurological complication: Secondary | ICD-10-CM

## 2023-12-08 ENCOUNTER — Ambulatory Visit: Admitting: Podiatry

## 2023-12-21 DIAGNOSIS — H401131 Primary open-angle glaucoma, bilateral, mild stage: Secondary | ICD-10-CM | POA: Diagnosis not present

## 2023-12-21 DIAGNOSIS — H43813 Vitreous degeneration, bilateral: Secondary | ICD-10-CM | POA: Diagnosis not present

## 2023-12-21 DIAGNOSIS — H35033 Hypertensive retinopathy, bilateral: Secondary | ICD-10-CM | POA: Diagnosis not present

## 2023-12-21 DIAGNOSIS — E119 Type 2 diabetes mellitus without complications: Secondary | ICD-10-CM | POA: Diagnosis not present

## 2023-12-21 DIAGNOSIS — Z961 Presence of intraocular lens: Secondary | ICD-10-CM | POA: Diagnosis not present

## 2023-12-21 DIAGNOSIS — H26493 Other secondary cataract, bilateral: Secondary | ICD-10-CM | POA: Diagnosis not present

## 2023-12-21 LAB — OPHTHALMOLOGY REPORT-SCANNED

## 2024-01-13 ENCOUNTER — Other Ambulatory Visit: Payer: Self-pay | Admitting: Family

## 2024-01-13 DIAGNOSIS — E1169 Type 2 diabetes mellitus with other specified complication: Secondary | ICD-10-CM

## 2024-01-19 ENCOUNTER — Other Ambulatory Visit: Payer: Self-pay | Admitting: Family

## 2024-01-28 ENCOUNTER — Inpatient Hospital Stay: Attending: Hematology

## 2024-01-28 ENCOUNTER — Inpatient Hospital Stay: Admitting: Hematology

## 2024-01-28 VITALS — BP 153/71 | HR 93 | Temp 96.8°F | Resp 19 | Ht 66.0 in | Wt 193.7 lb

## 2024-01-28 DIAGNOSIS — C49A0 Gastrointestinal stromal tumor, unspecified site: Secondary | ICD-10-CM | POA: Diagnosis not present

## 2024-01-28 LAB — CMP (CANCER CENTER ONLY)
ALT: 21 U/L (ref 0–44)
AST: 26 U/L (ref 15–41)
Albumin: 4.3 g/dL (ref 3.5–5.0)
Alkaline Phosphatase: 79 U/L (ref 38–126)
Anion gap: 10 (ref 5–15)
BUN: 15 mg/dL (ref 8–23)
CO2: 27 mmol/L (ref 22–32)
Calcium: 9.2 mg/dL (ref 8.9–10.3)
Chloride: 100 mmol/L (ref 98–111)
Creatinine: 1.03 mg/dL — ABNORMAL HIGH (ref 0.44–1.00)
GFR, Estimated: 55 mL/min — ABNORMAL LOW
Glucose, Bld: 151 mg/dL — ABNORMAL HIGH (ref 70–99)
Potassium: 3.9 mmol/L (ref 3.5–5.1)
Sodium: 136 mmol/L (ref 135–145)
Total Bilirubin: 0.5 mg/dL (ref 0.0–1.2)
Total Protein: 7.3 g/dL (ref 6.5–8.1)

## 2024-01-28 LAB — CBC WITH DIFFERENTIAL (CANCER CENTER ONLY)
Abs Immature Granulocytes: 0.05 K/uL (ref 0.00–0.07)
Basophils Absolute: 0 K/uL (ref 0.0–0.1)
Basophils Relative: 0 %
Eosinophils Absolute: 0.1 K/uL (ref 0.0–0.5)
Eosinophils Relative: 0 %
HCT: 32.4 % — ABNORMAL LOW (ref 36.0–46.0)
Hemoglobin: 10.7 g/dL — ABNORMAL LOW (ref 12.0–15.0)
Immature Granulocytes: 0 %
Lymphocytes Relative: 12 %
Lymphs Abs: 1.4 K/uL (ref 0.7–4.0)
MCH: 30.6 pg (ref 26.0–34.0)
MCHC: 33 g/dL (ref 30.0–36.0)
MCV: 92.6 fL (ref 80.0–100.0)
Monocytes Absolute: 0.7 K/uL (ref 0.1–1.0)
Monocytes Relative: 6 %
Neutro Abs: 9.9 K/uL — ABNORMAL HIGH (ref 1.7–7.7)
Neutrophils Relative %: 82 %
Platelet Count: 274 K/uL (ref 150–400)
RBC: 3.5 MIL/uL — ABNORMAL LOW (ref 3.87–5.11)
RDW: 14.3 % (ref 11.5–15.5)
WBC Count: 12.1 K/uL — ABNORMAL HIGH (ref 4.0–10.5)
nRBC: 0 % (ref 0.0–0.2)

## 2024-01-28 MED ORDER — IMATINIB MESYLATE 100 MG PO TABS: 300.0000 mg | ORAL_TABLET | Freq: Every day | ORAL | 1 refills | Status: AC

## 2024-01-28 NOTE — Assessment & Plan Note (Signed)
 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 S/p partial gastrectomy 01/14/21 by Dr. 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 No lymph nodes were evaluated.  -she has high risk of recurrence (86%) due to the large size and high grade.  -staging chest CT 02/17/21 negative for metastatic disease. 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 She is tolerating well so far with mild to moderate diarrhea, improved with Imodium. She developed worsening epigastric pain, and Gleevec was held for a month. -she restarted Gleevec at 100 mg on 05/22/21 and gradually increased to 300 mg daily in June, she is tolerating well so far  -restaging CT CAP from 12/31/2021 showed no evidence of recurrence.  Previous nodular area adjacent to the suture margin has resolved. 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 -She is clinically doing well, tolerating imatinib well, no clinical concern for recurrence. -Surveillance CT scan on October 14 2022 was negative for disease recurrence, except a 6 mm nodule at the suture line of her stomach.  She had EGD in July 2024 which was negative -Surveillance CT scan from April 02, 2023 showed stable hyperdense nodule adjacent to the greater curvature and small lung nodules, no definitive evidence of recurrence.  Will continue monitoring.

## 2024-01-28 NOTE — Progress Notes (Signed)
 "  [facility]   [phone] Fax:([phone]) [phone]   Clinic Follow up Note   Patient Care Team: Ngetich, Roxan BROCKS, NP as PCP - General (Family Medicine) Manford, Elspeth ORN, MD as Referring Physician (Optometry) Heide Ingle, MD as Consulting Physician (Orthopedic Surgery) Octavia Bruckner, MD as Consulting Physician (Ophthalmology) Aneita Gwendlyn DASEN, MD (Inactive) as Consulting Physician (Gastroenterology) Twylla Glendia BROCKS, MD (Urology) Stechschulte, Deward PARAS, MD as Consulting Physician (Surgery) Lanny Callander, MD as Consulting Physician (Hematology)  Date of Service:  01/28/2024  CHIEF COMPLAINT: f/u of chest  CURRENT THERAPY:  Adjuvant imatinib  300 mg daily  Oncology History   Gastrointestinal stromal tumor (GIST) (HCC) pT4NxM0, G2, 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 S/p partial gastrectomy 01/14/21 by Dr. Lyndel; path showed 12.8 cm unifocal GIST, margins negative. No lymph nodes were evaluated.  -she has high risk of recurrence (86%) due to the large size and high grade.  -staging chest CT 02/17/21 negative for metastatic disease. -KIT mutation was detected on surgical sample, which indicates she should have a good response to Imatinib   -she began Gleevec  400 mg on 03/09/21. She is tolerating well so far with mild to moderate diarrhea, improved with Imodium. She developed worsening epigastric pain, and Gleevec  was held for a month. -she restarted Gleevec  at 100 mg on 05/22/21 and gradually increased to 300 mg daily in June, she is tolerating well so far  -restaging CT CAP from 12/31/2021 showed no evidence of recurrence.  Previous nodular area adjacent to the suture margin has resolved. 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 -She is clinically doing well, tolerating imatinib  well, no clinical concern for recurrence. -Surveillance CT scan  on October 14 2022 was negative for disease recurrence, except a 6 mm nodule at the suture line of her stomach.  She had EGD in July 2024 which was negative -Surveillance CT scan from April 02, 2023 showed stable hyperdense nodule adjacent to the greater curvature and small lung nodules, no definitive evidence of recurrence.  Will continue monitoring.    Assessment & Plan Gastrointestinal stromal tumor (GIST) Three and a half years post-diagnosis, receiving adjuvant imatinib  300 mg daily, with good tolerance and no significant adverse effects. Discussed that standard adjuvant therapy duration is three years, but extending to five years may reduce recurrence risk by approximately 5%. She is willing to continue therapy given good tolerance. - Refilled imatinib  (Gleevec ) for three months with one additional refill. - Planned follow-up in three months. - Planned CT scan at next visit, extending interval to nine months between scans. - Discussed extended adjuvant therapy duration and recurrence risk reduction; she agreed to continue imatinib  for a total of 5 years.  Anemia Mild anemia with most recent hemoglobin 10.7 g/dL, showing slight improvement.  Small pericardial effusion Small pericardial effusion on recent echocardiogram with normal cardiac function and no symptoms such as chest pain or dyspnea. No intervention indicated. - Monitor clinically without intervention.  Plan - She is clinically doing well, tolerating imatinib  well, will continue - Lab and follow-up in 3 months, plan to repeat a CT scan in 6 months  Discussed the use of AI scribe software for clinical note transcription with the patient, who gave verbal consent to proceed.  History of Present Illness Alison [name] is a 79 year old female with resected gastrointestinal stromal tumor who presents for routine oncology follow-up.  She is 3.5 years post-diagnosis and remains  on imatinib  300 mg daily after prior dose adjustments  from 400 mg down to 100 mg. She tolerates treatment well without diarrhea or other gastrointestinal toxicity.  Her October 2025 CT showed post-surgical changes and multiple areas of lung inflammation. She has occasional cough with phlegm but no persistent cough, dyspnea, chest pain, or abdominal pain.  She has mild anemia with hemoglobin 10.7 g/dL, slightly improved from prior labs, and is on oral iron . Other blood counts and liver function tests are normal.  Recent echocardiogram showed a small pericardial effusion and mildly increased left atrial size. She is asymptomatic without chest pain or shortness of breath.  She has chronic severe bilateral knee pain with a malpositioned knee implant, uses two knee braces and a walker, and has limited activity with fatigue on walking that she attributes to pain and deconditioning. Prednisone for pain was reduced because of concurrent imatinib .  She is a former smoker. She obtains imatinib  from [facility] with a copay.     All other systems were reviewed with the patient and are negative.  MEDICAL HISTORY:  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   Goiter, specified as simple    Helicobacter pylori gastritis    Hyperlipidemia LDL goal < 100    Intrahepatic bile duct dilation    Numbness    TOES RT FOOT and toes left foot   Obesity, unspecified    Osteoarthrosis, unspecified whether generalized or localized, lower leg    Osteopenia    Other specified disease of nail    FUNGUS OF FINGERNAILS   Pernicious anemia    Unspecified glaucoma(365.9)    Unspecified vitamin D  deficiency    Wears dentures    Wears glasses     SURGICAL  HISTORY: Past Surgical History:  Procedure Laterality Date   BACK SURGERY     CATARACT EXTRACTION     right eye 06/20/15 left eye 09/12/15   COLECTOMY  2009   COLONOSCOPY  09/06/2007   Dr Belvie Just   DILATION AND CURETTAGE OF UTERUS     ENDOSCOPIC RETROGRADE CHOLANGIOPANCREATOGRAPHY (ERCP) WITH PROPOFOL  N/A 03/17/2019   Procedure: ENDOSCOPIC RETROGRADE CHOLANGIOPANCREATOGRAPHY (ERCP) WITH PROPOFOL ;  Surgeon: Aneita Gwendlyn DASEN, MD;  Location: [facility] ENDOSCOPY;  Service: Endoscopy;  Laterality: N/A;   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   JOINT REPLACEMENT     right   LUMBAR LAMINECTOMY/DECOMPRESSION MICRODISCECTOMY N/A 03/30/2013   Procedure: CENTRAL DECOMPRESSION L4 - L5 AND EXCISION 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;  Surgeon: Tanda DELENA Heading, MD;  Location: [facility];  Service: Orthopedics;  Laterality: N/A;   MULTIPLE TOOTH EXTRACTIONS     REMOVAL OF STONES  03/17/2019   Procedure: REMOVAL OF STONES;  Surgeon: Aneita Gwendlyn DASEN, MD;  Location: [facility] ENDOSCOPY;  Service: Endoscopy;;   SIGMOIDOSCOPY  02/16/2018   Dr Aneita   SPHINCTEROTOMY  03/17/2019   Procedure: SPHINCTEROTOMY;  Surgeon: Aneita Gwendlyn DASEN, MD;  Location: [facility] ENDOSCOPY;  Service: Endoscopy;;   TOTAL KNEE ARTHROPLASTY Right 02/01/2013   Procedure: RIGHT TOTAL KNEE ARTHROPLASTY;  Surgeon: Tanda DELENA Heading, MD;  Location: [facility];  Service: Orthopedics;  Laterality: Right;   TOTAL KNEE ARTHROPLASTY Left 04/18/2014  Procedure: LEFT TOTAL KNEE ARTHROPLASTY;  Surgeon: Tanda Heading, MD;  Location: [facility];  Service: Orthopedics;  Laterality: Left;    I have reviewed the social history and family history with the patient and they are unchanged from previous note.  ALLERGIES:  is allergic to aspirin .  MEDICATIONS:  Current Outpatient Medications  Medication Sig Dispense Refill   acetaminophen  (TYLENOL ) 650 MG CR tablet Take 1,300 mg by mouth as needed.     amLODipine  (NORVASC ) 10 MG tablet Take 1 tablet by mouth once  daily 90 tablet 1   atorvastatin  (LIPITOR) 40 MG tablet TAKE 1 TABLET BY MOUTH ONCE DAILY AT  6  IN  THE  EVENING 90 tablet 0   brimonidine (ALPHAGAN) 0.2 % ophthalmic solution Place 1 drop into both eyes 2 (two) times daily.     Calcium  Carb-Cholecalciferol (CALCIUM  600 + D PO) Take 1 tablet by mouth in the morning.     carboxymethylcellulose (REFRESH PLUS) 0.5 % SOLN Place 1 drop into both eyes 3 (three) times daily as needed (dry eyes).      Cholecalciferol (VITAMIN D -3) 1000 UNITS CAPS Take 1,000 Units by mouth in the morning.     COVID-19 mRNA vaccine, Pfizer, (COMIRNATY) syringe Inject 0.3 mLs into the muscle as directed. 0.3 mL 1   diclofenac Sodium (VOLTAREN) 1 % GEL Apply 2 g topically as needed.     dorzolamide -timolol  (COSOPT ) 22.3-6.8 MG/ML ophthalmic solution Place 1 drop into both eyes 2 (two) times daily.     FEROSUL 325 (65 Fe) MG tablet Take 1 tablet by mouth once daily 90 tablet 0   gabapentin  (NEURONTIN ) 300 MG capsule Take 1 capsule by mouth at bedtime 90 capsule 0   lisinopril  (ZESTRIL ) 20 MG tablet Take 1 tablet by mouth once daily 90 tablet 0   loperamide (IMODIUM A-D) 2 MG tablet Take 1 mg by mouth daily as needed.     Multiple Vitamin (MULTIVITAMIN WITH MINERALS) TABS tablet Take 1 tablet by mouth daily.     nystatin  cream (MYCOSTATIN ) Apply 1 Application topically 2 (two) times daily. 30 g 3   nystatin -triamcinolone  ointment (MYCOLOG) Apply 1 Application topically 2 (two) times daily. 30 g 0   Omega-3 Fatty Acids (OMEGA 3 PO) Take 1,200 mg by mouth in the morning.     ondansetron  (ZOFRAN ) 8 MG tablet Take 1 tablet (8 mg total) by mouth every 8 (eight) hours as needed for nausea or vomiting. 20 tablet 1   pantoprazole  (PROTONIX ) 40 MG tablet Take 1 tablet (40 mg total) by mouth daily. 90 tablet 5   prednisoLONE acetate (PRED FORTE) 1 % ophthalmic suspension Place 1 drop into the right eye 4 (four) times daily.     predniSONE (DELTASONE) 5 MG tablet Take 5 mg by mouth 2  (two) times daily.     ROCKLATAN 0.02-0.005 % SOLN Apply 1 drop to eye at bedtime.     shark liver oil-cocoa butter (PREPARATION H) 0.25-88.44 % suppository Place 1 suppository rectally as needed for hemorrhoids.     Zinc Oxide (DESITIN EX) Apply 1 application topically as needed.     imatinib  (GLEEVEC ) 100 MG tablet Take 3 tablets (300 mg total) by mouth daily. Take with meals and large glass of water.Caution:Chemotherapy 270 tablet 1   No current facility-administered medications for this visit.    PHYSICAL EXAMINATION: ECOG PERFORMANCE STATUS: 1 - Symptomatic but completely ambulatory  Vitals:   01/28/24 1305 01/28/24 1306  BP: (!) 163/78 (!) 153/71  Pulse:  95 93  Resp: 19   Temp: (!) 96.8 F (36 C)   SpO2: 100% 100%   Wt Readings from Last 3 Encounters:  01/28/24 193 lb 11.2 oz (87.9 kg)  10/28/23 193 lb 3.2 oz (87.6 kg)  09/27/23 187 lb (84.8 kg)     GENERAL:alert, no distress and comfortable SKIN: skin color, texture, turgor are normal, no rashes or significant lesions EYES: normal, Conjunctiva are pink and non-injected, sclera clear NECK: supple, thyroid  normal size, non-tender, without nodularity LYMPH:  no palpable lymphadenopathy in the cervical, axillary  LUNGS: clear to auscultation and percussion with normal breathing effort HEART: regular rate & rhythm and (+) systolic murmurs and no lower extremity edema ABDOMEN:abdomen soft, non-tender and normal bowel sounds Musculoskeletal:no cyanosis of digits and no clubbing  NEURO: alert & oriented x 3 with fluent speech, no focal motor/sensory deficits  Physical Exam   LABORATORY DATA:  I have reviewed the data as listed    Latest Ref Rng & Units 01/28/2024   12:28 PM 10/21/2023   10:29 AM 07/28/2023   10:27 AM  CBC  WBC 4.0 - 10.5 K/uL 12.1  8.4  7.8   Hemoglobin 12.0 - 15.0 g/dL 89.2  89.5  89.8   Hematocrit 36.0 - 46.0 % 32.4  31.8  31.2   Platelets 150 - 400 K/uL 274  279  271         Latest Ref Rng &  Units 01/28/2024   12:28 PM 10/21/2023   10:29 AM 07/28/2023   10:27 AM  CMP  Glucose 70 - 99 mg/dL 848  97  890   BUN 8 - 23 mg/dL 15  14  13    Creatinine 0.44 - 1.00 mg/dL 8.96  8.97  8.83   Sodium 135 - 145 mmol/L 136  136  137   Potassium 3.5 - 5.1 mmol/L 3.9  3.8  4.5   Chloride 98 - 111 mmol/L 100  103  106   CO2 22 - 32 mmol/L 27  29  28    Calcium  8.9 - 10.3 mg/dL 9.2  9.5  9.0   Total Protein 6.5 - 8.1 g/dL 7.3  7.4  6.9   Total Bilirubin 0.0 - 1.2 mg/dL 0.5  0.6  0.5   Alkaline Phos 38 - 126 U/L 79  67  59   AST 15 - 41 U/L 26  21  21    ALT 0 - 44 U/L 21  13  13        RADIOGRAPHIC STUDIES: I have personally reviewed the radiological images as listed and agreed with the findings in the report. No results found.    No orders of the defined types were placed in this encounter.  All questions were answered. The patient knows to call the clinic with any problems, questions or concerns. No barriers to learning was detected. The total time spent in the appointment was 30 minutes, including review of chart and various tests results, discussions about plan of care and coordination of care plan     Onita Mattock, MD 01/28/2024     "

## 2024-02-13 ENCOUNTER — Other Ambulatory Visit: Payer: Self-pay | Admitting: Family

## 2024-02-13 DIAGNOSIS — D509 Iron deficiency anemia, unspecified: Secondary | ICD-10-CM

## 2024-02-16 ENCOUNTER — Encounter: Payer: Self-pay | Admitting: Podiatry

## 2024-02-16 ENCOUNTER — Ambulatory Visit: Admitting: Podiatry

## 2024-03-15 ENCOUNTER — Other Ambulatory Visit: Payer: Self-pay

## 2024-03-21 ENCOUNTER — Ambulatory Visit: Payer: Self-pay | Admitting: Family

## 2024-04-27 ENCOUNTER — Inpatient Hospital Stay

## 2024-04-27 ENCOUNTER — Inpatient Hospital Stay: Admitting: Hematology

## 2024-05-24 ENCOUNTER — Ambulatory Visit: Admitting: Podiatry
